# Patient Record
Sex: Male | Born: 1960
Health system: Southern US, Community
[De-identification: ages and names within clinical notes are randomized; demographics above are authoritative.]

## PROBLEM LIST (undated history)

## (undated) DIAGNOSIS — K56609 Unspecified intestinal obstruction, unspecified as to partial versus complete obstruction: Secondary | ICD-10-CM

## (undated) DIAGNOSIS — M1990 Unspecified osteoarthritis, unspecified site: Secondary | ICD-10-CM

## (undated) DIAGNOSIS — Z7901 Long term (current) use of anticoagulants: Secondary | ICD-10-CM

## (undated) DIAGNOSIS — R7401 Elevation of levels of liver transaminase levels: Secondary | ICD-10-CM

## (undated) DIAGNOSIS — K08109 Complete loss of teeth, unspecified cause, unspecified class: Secondary | ICD-10-CM

## (undated) DIAGNOSIS — I700 Atherosclerosis of aorta: Secondary | ICD-10-CM

## (undated) DIAGNOSIS — M40204 Unspecified kyphosis, thoracic region: Secondary | ICD-10-CM

## (undated) DIAGNOSIS — E1065 Type 1 diabetes mellitus with hyperglycemia: Secondary | ICD-10-CM

## (undated) DIAGNOSIS — E7800 Pure hypercholesterolemia, unspecified: Secondary | ICD-10-CM

## (undated) DIAGNOSIS — E1110 Type 2 diabetes mellitus with ketoacidosis without coma: Secondary | ICD-10-CM

## (undated) DIAGNOSIS — K439 Ventral hernia without obstruction or gangrene: Secondary | ICD-10-CM

## (undated) DIAGNOSIS — T8859XA Other complications of anesthesia, initial encounter: Secondary | ICD-10-CM

## (undated) DIAGNOSIS — I499 Cardiac arrhythmia, unspecified: Secondary | ICD-10-CM

## (undated) DIAGNOSIS — K631 Perforation of intestine (nontraumatic): Secondary | ICD-10-CM

## (undated) DIAGNOSIS — N529 Male erectile dysfunction, unspecified: Secondary | ICD-10-CM

## (undated) DIAGNOSIS — E0590 Thyrotoxicosis, unspecified without thyrotoxic crisis or storm: Secondary | ICD-10-CM

## (undated) DIAGNOSIS — D539 Nutritional anemia, unspecified: Secondary | ICD-10-CM

## (undated) DIAGNOSIS — R809 Proteinuria, unspecified: Secondary | ICD-10-CM

## (undated) DIAGNOSIS — IMO0002 Reserved for concepts with insufficient information to code with codable children: Secondary | ICD-10-CM

## (undated) DIAGNOSIS — E049 Nontoxic goiter, unspecified: Secondary | ICD-10-CM

## (undated) DIAGNOSIS — E041 Nontoxic single thyroid nodule: Secondary | ICD-10-CM

## (undated) DIAGNOSIS — Z98811 Dental restoration status: Secondary | ICD-10-CM

## (undated) DIAGNOSIS — G909 Disorder of the autonomic nervous system, unspecified: Secondary | ICD-10-CM

## (undated) DIAGNOSIS — F39 Unspecified mood [affective] disorder: Secondary | ICD-10-CM

## (undated) DIAGNOSIS — I10 Essential (primary) hypertension: Secondary | ICD-10-CM

## (undated) DIAGNOSIS — K219 Gastro-esophageal reflux disease without esophagitis: Secondary | ICD-10-CM

## (undated) HISTORY — PX: KNEE ARTHROSCOPY: SHX127

## (undated) HISTORY — DX: Disorder of the autonomic nervous system, unspecified: G90.9

## (undated) HISTORY — DX: Essential (primary) hypertension: I10

## (undated) HISTORY — PX: CYSTECTOMY: SUR359

## (undated) HISTORY — DX: Nontoxic goiter, unspecified: E04.9

## (undated) HISTORY — DX: Type 1 diabetes mellitus with hyperglycemia: E10.65

## (undated) HISTORY — DX: Reserved for concepts with insufficient information to code with codable children: IMO0002

## (undated) HISTORY — DX: Gastro-esophageal reflux disease without esophagitis: K21.9

## (undated) HISTORY — DX: Elevation of levels of liver transaminase levels: R74.01

## (undated) HISTORY — DX: Proteinuria, unspecified: R80.9

---

## 1983-11-09 HISTORY — PX: SKIN GRAFT SPLIT THICKNESS LEG / FOOT: SUR1303

## 2005-08-06 ENCOUNTER — Encounter: Payer: Self-pay | Admitting: Family Medicine

## 2005-08-06 LAB — CONVERTED CEMR LAB: Testosterone, total: 413 ng/mL

## 2008-12-26 ENCOUNTER — Inpatient Hospital Stay (HOSPITAL_COMMUNITY): Admission: EM | Admit: 2008-12-26 | Discharge: 2008-12-29 | Payer: Self-pay | Admitting: Emergency Medicine

## 2008-12-26 HISTORY — PX: ORIF DISTAL RADIUS FRACTURE: SUR927

## 2008-12-26 HISTORY — PX: OPEN REDUCTION NASAL FRACTURE: SHX2105

## 2009-01-21 ENCOUNTER — Encounter: Admission: RE | Admit: 2009-01-21 | Discharge: 2009-01-21 | Payer: Self-pay | Admitting: Neurological Surgery

## 2009-04-09 ENCOUNTER — Encounter: Payer: Self-pay | Admitting: Family Medicine

## 2009-04-09 LAB — CONVERTED CEMR LAB
HDL: 39.7 mg/dL
Hgb A1c MFr Bld: 7.2 %
LDL Cholesterol: 87.1 mg/dL
MCV: 91.7 fL
WBC: 5.8 10*3/uL

## 2009-10-01 IMAGING — CR DG ORBITS FOR FOREIGN BODY
2 series · 2 of 2 positions shown · non-contrast
Comparison: None

CLINICAL DATA: Previous exposure to metal

ORBITS FOR FOREIGN BODY - 2 VIEW

[view not recorded (1 of 2)]
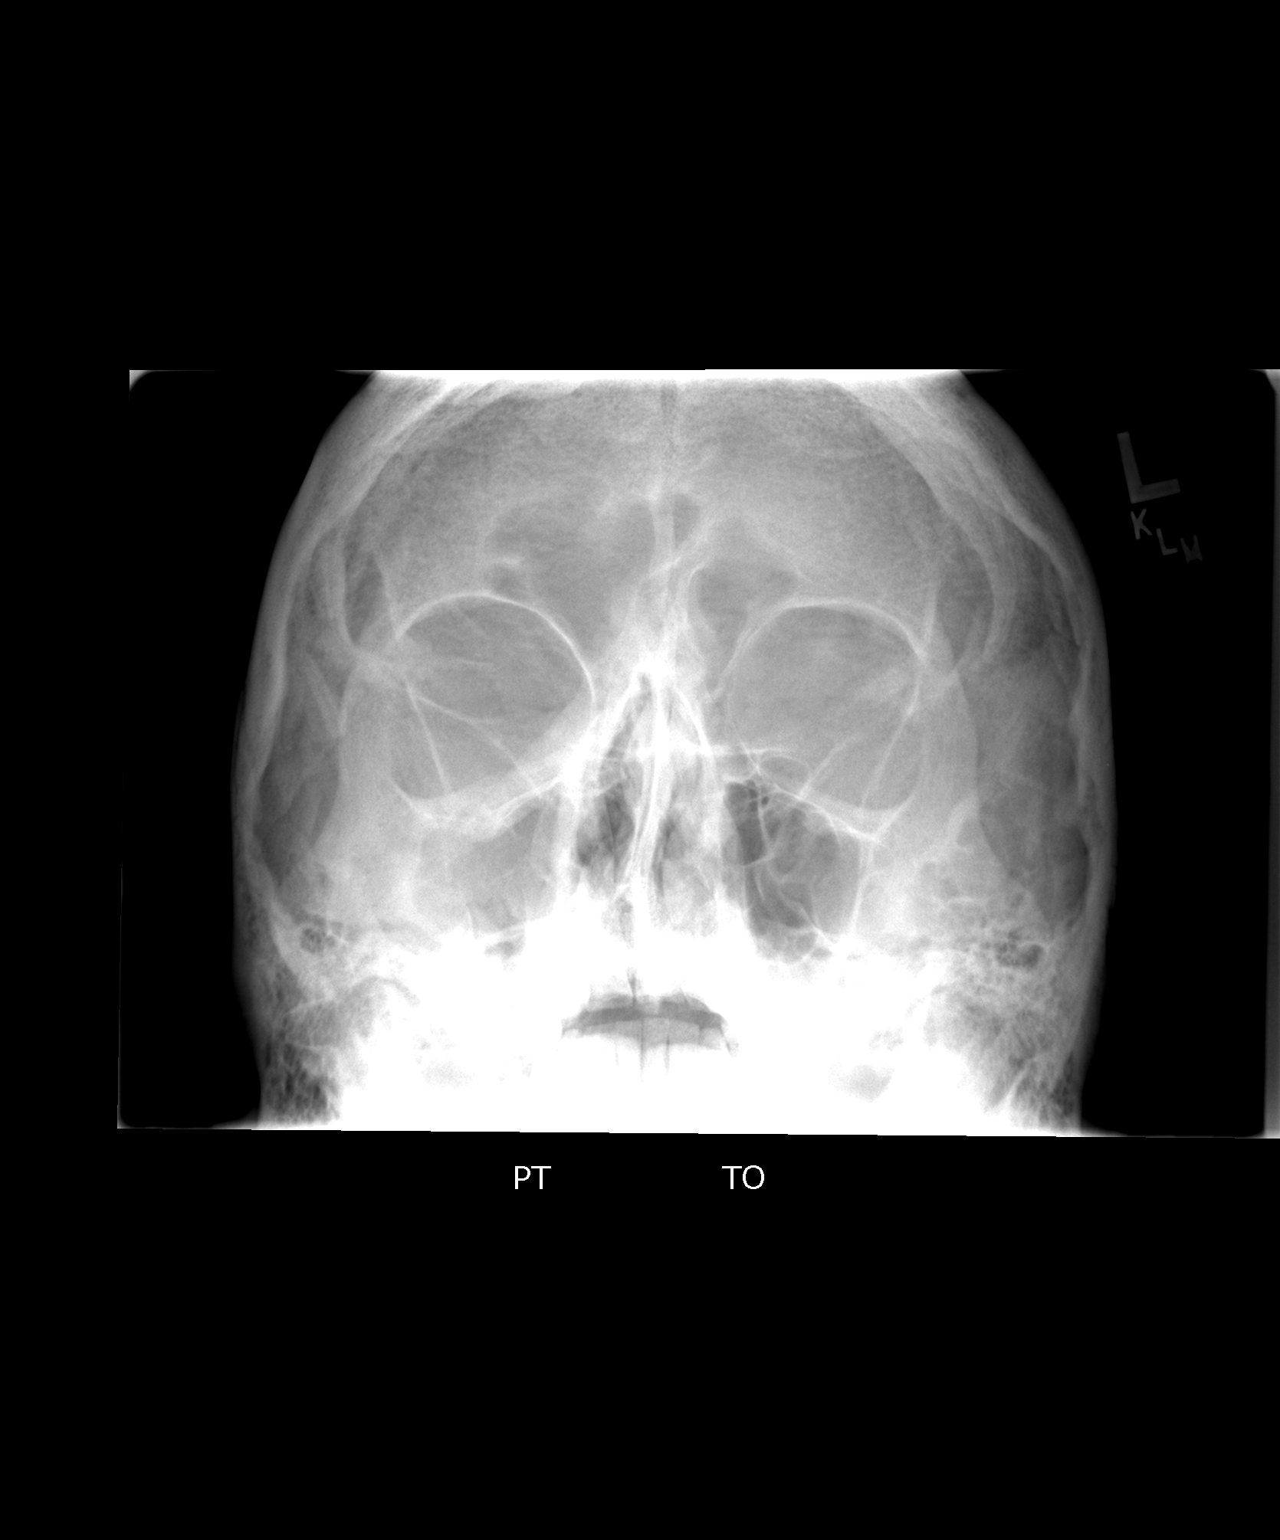

[view not recorded (2 of 2)]
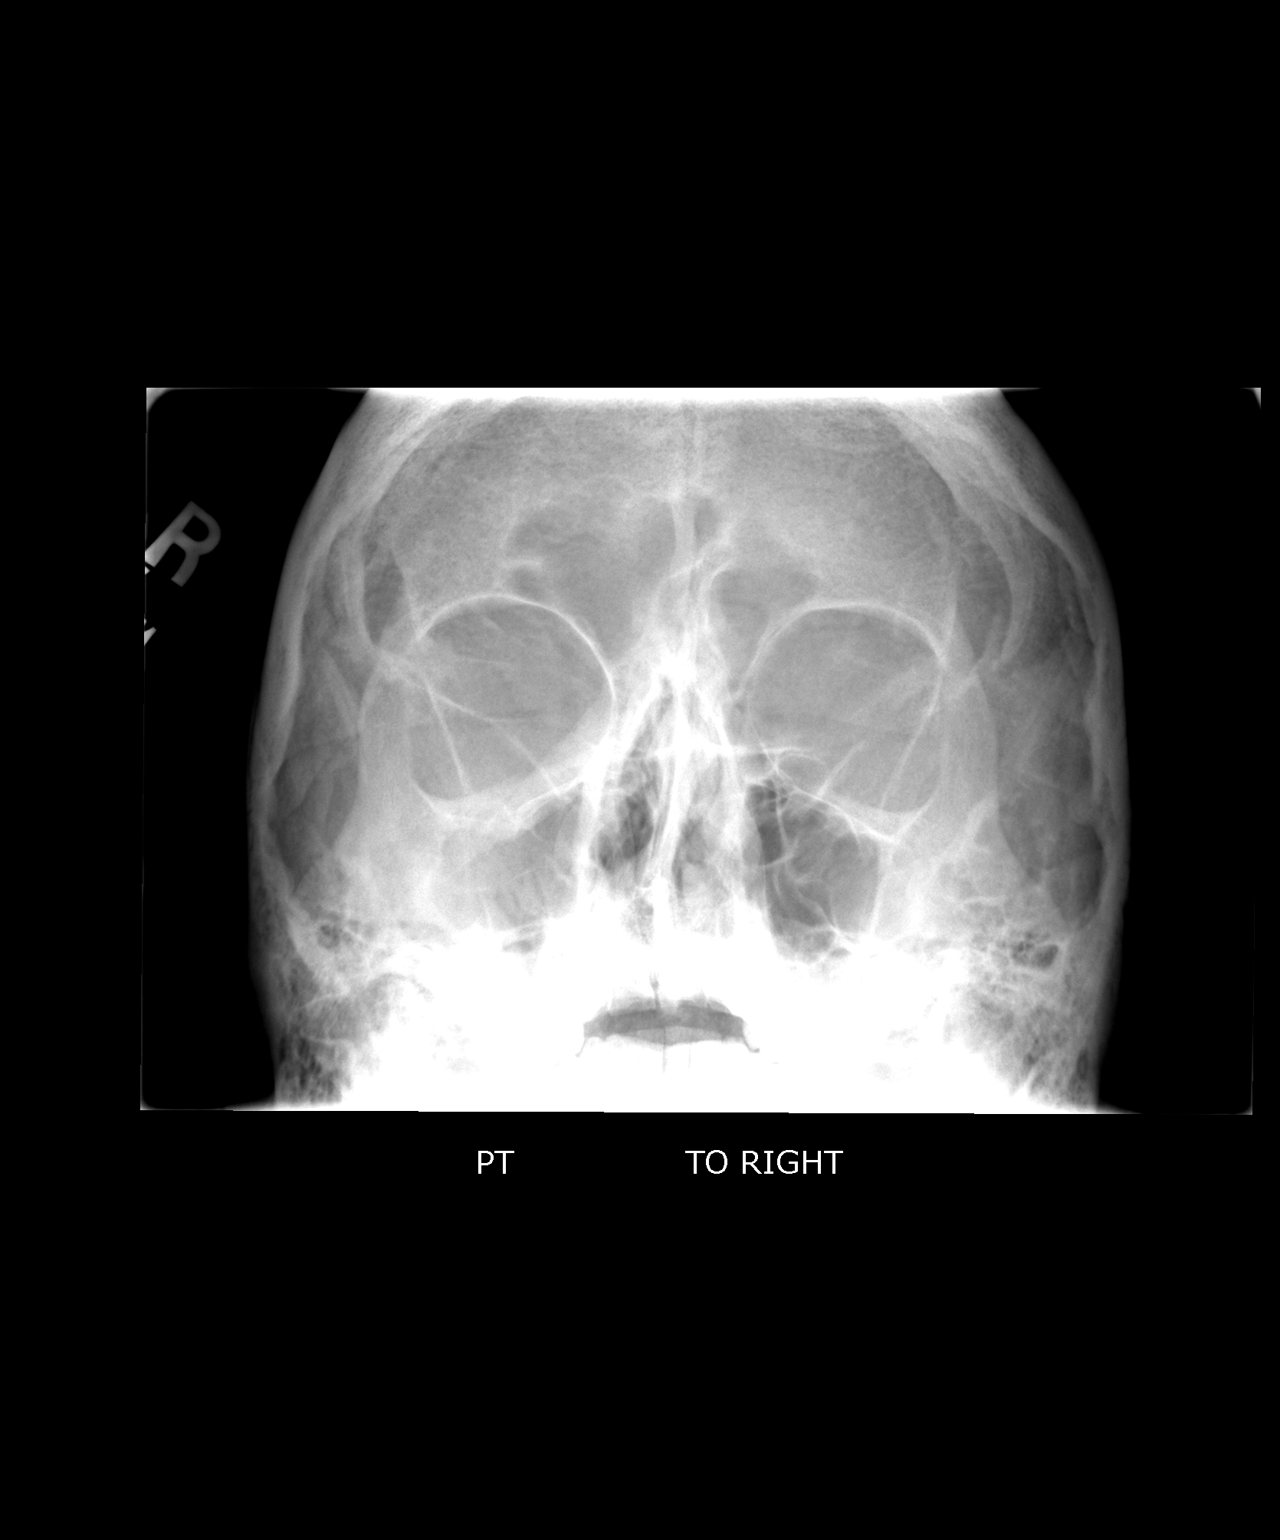

[2 of 2 positions shown; findings below may reference images not displayed]

FINDINGS: No metallic orbital foreign body.
IMPRESSION: Negative for metallic orbital foreign material.

## 2010-01-13 ENCOUNTER — Emergency Department (HOSPITAL_COMMUNITY): Admission: EM | Admit: 2010-01-13 | Discharge: 2010-01-14 | Payer: Self-pay | Admitting: Emergency Medicine

## 2010-01-30 ENCOUNTER — Encounter: Payer: Self-pay | Admitting: Family Medicine

## 2010-01-30 LAB — CONVERTED CEMR LAB
BUN: 15 mg/dL
CO2: 29.1 meq/L
Calcium: 9.3 mg/dL
Hgb A1c MFr Bld: 7.8 %
Microalb, Ur: 6 mg/dL
Potassium: 4.4 meq/L

## 2010-03-18 ENCOUNTER — Emergency Department: Payer: Self-pay | Admitting: Emergency Medicine

## 2010-04-09 ENCOUNTER — Encounter: Payer: Self-pay | Admitting: Family Medicine

## 2010-04-09 LAB — CONVERTED CEMR LAB
Cholesterol: 155 mg/dL
Triglycerides: 146 mg/dL

## 2010-09-08 ENCOUNTER — Encounter: Payer: Self-pay | Admitting: Family Medicine

## 2010-09-08 LAB — CONVERTED CEMR LAB: Hgb A1c MFr Bld: 7.8 %

## 2010-10-22 ENCOUNTER — Ambulatory Visit: Payer: Self-pay | Admitting: Internal Medicine

## 2010-10-22 DIAGNOSIS — E108 Type 1 diabetes mellitus with unspecified complications: Secondary | ICD-10-CM | POA: Insufficient documentation

## 2010-10-22 DIAGNOSIS — I10 Essential (primary) hypertension: Secondary | ICD-10-CM | POA: Insufficient documentation

## 2010-10-22 DIAGNOSIS — F17200 Nicotine dependence, unspecified, uncomplicated: Secondary | ICD-10-CM | POA: Insufficient documentation

## 2010-10-22 DIAGNOSIS — E109 Type 1 diabetes mellitus without complications: Secondary | ICD-10-CM | POA: Insufficient documentation

## 2010-10-22 DIAGNOSIS — E1069 Type 1 diabetes mellitus with other specified complication: Secondary | ICD-10-CM

## 2010-10-22 DIAGNOSIS — E785 Hyperlipidemia, unspecified: Secondary | ICD-10-CM

## 2010-10-22 DIAGNOSIS — K219 Gastro-esophageal reflux disease without esophagitis: Secondary | ICD-10-CM | POA: Insufficient documentation

## 2010-10-22 HISTORY — DX: Type 1 diabetes mellitus with other specified complication: E10.69

## 2010-10-22 HISTORY — DX: Nicotine dependence, unspecified, uncomplicated: F17.200

## 2010-11-07 ENCOUNTER — Encounter: Payer: Self-pay | Admitting: Family Medicine

## 2010-11-29 ENCOUNTER — Encounter: Payer: Self-pay | Admitting: Neurological Surgery
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
## 2011-02-01 LAB — URINALYSIS, ROUTINE W REFLEX MICROSCOPIC
Glucose, UA: >1000 mg/dL — AB
Leukocytes, UA: NEGATIVE
Nitrite: NEGATIVE
pH: 7.5 (ref 5.0–8.0)

## 2011-02-01 LAB — DIFFERENTIAL
Eosinophils Absolute: 0.3 10*3/uL (ref 0.0–0.7)
Eosinophils Relative: 3 % (ref 0–5)
Lymphs Abs: 2.8 10*3/uL (ref 0.7–4.0)
Monocytes Absolute: 0.6 10*3/uL (ref 0.1–1.0)
Monocytes Relative: 6 % (ref 3–12)

## 2011-02-01 LAB — COMPREHENSIVE METABOLIC PANEL
ALT: 20 U/L (ref 0–53)
AST: 16 U/L (ref 0–37)
Albumin: 3.4 g/dL — ABNORMAL LOW (ref 3.5–5.2)
CO2: 26 mEq/L (ref 19–32)
Calcium: 8.5 mg/dL (ref 8.4–10.5)
GFR calc Af Amer: >60 mL/min (ref >60)
Sodium: 136 mEq/L (ref 135–145)
Total Protein: 6.1 g/dL (ref 6.0–8.3)

## 2011-02-01 LAB — CBC
MCHC: 34.1 g/dL (ref 30.0–36.0)
RBC: 4.88 MIL/uL (ref 4.22–5.81)
RDW: 13.4 % (ref 11.5–15.5)

## 2011-02-01 LAB — URINE CULTURE

## 2011-02-01 LAB — URINE MICROSCOPIC-ADD ON

## 2011-02-01 LAB — HEMOCCULT GUIAC POC 1CARD (OFFICE): Fecal Occult Bld: NEGATIVE

## 2011-02-23 LAB — COMPREHENSIVE METABOLIC PANEL
Albumin: 3.5 g/dL (ref 3.5–5.2)
Alkaline Phosphatase: 65 U/L (ref 39–117)
BUN: 17 mg/dL (ref 6–23)
Chloride: 102 mEq/L (ref 96–112)
Creatinine, Ser: 0.79 mg/dL (ref 0.4–1.5)
Glucose, Bld: 86 mg/dL (ref 70–99)
Total Bilirubin: 1.3 mg/dL — ABNORMAL HIGH (ref 0.3–1.2)

## 2011-02-23 LAB — BASIC METABOLIC PANEL
BUN: 18 mg/dL (ref 6–23)
Chloride: 105 mEq/L (ref 96–112)
GFR calc non Af Amer: >60 mL/min (ref >60)
Glucose, Bld: 351 mg/dL — ABNORMAL HIGH (ref 70–99)
Potassium: 5 mEq/L (ref 3.5–5.1)
Sodium: 137 mEq/L (ref 135–145)

## 2011-02-23 LAB — LIPASE, BLOOD: Lipase: 20 U/L (ref 11–59)

## 2011-02-23 LAB — GLUCOSE, CAPILLARY
Glucose-Capillary: 204 mg/dL — ABNORMAL HIGH (ref 70–99)
Glucose-Capillary: 227 mg/dL — ABNORMAL HIGH (ref 70–99)
Glucose-Capillary: 233 mg/dL — ABNORMAL HIGH (ref 70–99)
Glucose-Capillary: 265 mg/dL — ABNORMAL HIGH (ref 70–99)
Glucose-Capillary: 276 mg/dL — ABNORMAL HIGH (ref 70–99)
Glucose-Capillary: 29 mg/dL — CL (ref 70–99)
Glucose-Capillary: 325 mg/dL — ABNORMAL HIGH (ref 70–99)
Glucose-Capillary: 373 mg/dL — ABNORMAL HIGH (ref 70–99)
Glucose-Capillary: 401 mg/dL — ABNORMAL HIGH (ref 70–99)
Glucose-Capillary: 421 mg/dL — ABNORMAL HIGH (ref 70–99)

## 2011-02-23 LAB — CBC
HCT: 45.8 % (ref 39.0–52.0)
Hemoglobin: 14.1 g/dL (ref 13.0–17.0)
Hemoglobin: 15.9 g/dL (ref 13.0–17.0)
MCHC: 34.5 g/dL (ref 30.0–36.0)
MCV: 93.8 fL (ref 78.0–100.0)
MCV: 94.7 fL (ref 78.0–100.0)
Platelets: 310 10*3/uL (ref 150–400)
Platelets: 313 10*3/uL (ref 150–400)
RDW: 13.8 % (ref 11.5–15.5)
RDW: 13.8 % (ref 11.5–15.5)
RDW: 14 % (ref 11.5–15.5)
WBC: 13.4 10*3/uL — ABNORMAL HIGH (ref 4.0–10.5)

## 2011-02-23 LAB — DIFFERENTIAL
Basophils Absolute: 0 10*3/uL (ref 0.0–0.1)
Basophils Relative: 0 % (ref 0–1)
Lymphocytes Relative: 15 % (ref 12–46)
Monocytes Absolute: 0.5 10*3/uL (ref 0.1–1.0)
Neutro Abs: 10.8 10*3/uL — ABNORMAL HIGH (ref 1.7–7.7)
Neutrophils Relative %: 81 % — ABNORMAL HIGH (ref 43–77)

## 2011-02-23 LAB — URINALYSIS, ROUTINE W REFLEX MICROSCOPIC
Bilirubin Urine: NEGATIVE
Ketones, ur: NEGATIVE mg/dL
Nitrite: NEGATIVE
Protein, ur: NEGATIVE mg/dL
Urobilinogen, UA: 0.2 mg/dL (ref 0.0–1.0)

## 2011-02-23 LAB — HEMOGLOBIN A1C
Hgb A1c MFr Bld: 7 % — ABNORMAL HIGH (ref 4.6–6.1)
Mean Plasma Glucose: 154 mg/dL
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
## 2011-04-01 ENCOUNTER — Encounter: Payer: Self-pay | Admitting: Family Medicine

## 2011-04-06 ENCOUNTER — Encounter: Payer: Self-pay | Admitting: Family Medicine

## 2011-04-06 ENCOUNTER — Ambulatory Visit (INDEPENDENT_AMBULATORY_CARE_PROVIDER_SITE_OTHER): Payer: PRIVATE HEALTH INSURANCE | Admitting: Family Medicine

## 2011-04-06 VITALS — BP 130/72 | HR 88 | Temp 98.3°F | Wt 248.0 lb

## 2011-04-06 DIAGNOSIS — M722 Plantar fascial fibromatosis: Secondary | ICD-10-CM | POA: Insufficient documentation

## 2011-04-06 NOTE — Assessment & Plan Note (Signed)
Right side. See pt instructions. Supportive care.  Update [place] if not improving as expected. May benefit from orthotics - if pt desires may refer to [facility] or podiatry in future.

## 2011-04-06 NOTE — Progress Notes (Signed)
  Subjective:    Patient ID: Russell Gomez, male    DOB: [birth year], 50 y.o.   MRN: [id number]  HPI CC: R foot pain  R foot pain - started about 1 mo ago, R heel.  Worse towards end of week.  Works on Cur[employer] at [employer] - 8 hours on feet, concrete.  Has been off feet last 3 days - this helped.  Tried alleve when got home which helped.  Worse pain when standing from sitting position.  Not worse with first couple steps.  Also wants varicose veins checked out.  DM - last A1c 7.1%, was having too many lows, endo changing insulin.  Review of Systems Per HPI    Objective:   Physical Exam  Nursing note and vitals reviewed. Constitutional: He appears well-developed and well-nourished. No distress.  Musculoskeletal:       Flat foot bilateral R>L with some loss of longitudinal and transverse arches, excess pronation. + tender R heel medial>lateral, worse with dorsiflexion. No heel spur appreciated  Monofilament intact bilaterally. 2+ DP bilaterally + vericose veins L>R leg, no palp cords or erythema/edema          Assessment & Plan:

## 2011-04-06 NOTE — Patient Instructions (Addendum)
You have plantar fasciitis. Handout provided. Frozen waterbottle massage when you get home at night. Buy heel cushion to use at work, look into boots with better support for heels. May use anti inflammatory as needed. Keep thinking about cutting back or quitting smoking. See you for your physical.  Plantar Fasciitis Plantar fasciitis is a common condition that causes foot pain. It is soreness (inflammation) of the band of tough fibrous tissue on the bottom of the foot that runs from the heel bone (calcaneus) to the ball of the foot. The cause of this soreness may be from excessive standing, poor fitting shoes, running on hard surfaces, being overweight, having an abnormal walk, or overuse (this is common in runners) of the painful foot or feet. It is also common in aerobic exercise dancers and ballet dancers.  SYMPTOMS Most people with plantar fasciitis complain of:  Severe pain in the morning on the bottom of their foot especially when taking the first steps out of bed. This pain recedes after a few minutes of walking.   Severe pain is experienced also during walking following a long period of inactivity.   Pain is worse when walking barefoot or up stairs  DIAGNOSIS  Your caregiver will diagnose this condition by examining and feeling your foot.   Special tests such as x-rays of your foot, are usually not needed.  PREVENTION  Consult a sports medicine professional before beginning a new exercise program.   Walking programs offer a good workout. With walking there is a lower chance of overuse injuries common to runners. There is less impact and less jarring of the joints.   Begin all new exercise programs slowly. If problems or pain develop, decrease the amount of time or distance until you are at a comfortable level.   Wear good shoes and replace them regularly.   Stretch your foot and the heel cords at the back of the ankle (Achilles tendon) both before and after exercise.   Run  or exercise on even surfaces that are not hard. For example, asphalt is better than pavement.   Do not run barefoot on hard surfaces.   If using a treadmill, vary the incline.   Do not continue to workout if you have foot or joint problems. Seek professional help if they do not improve.  HOME CARE INSTRUCTIONS  Avoid activities that cause you pain until you recover.   Use ice or cold packs on the problem or painful areas after working out.   Only take over-the-counter or prescription medicines for pain, discomfort, or fever as directed by your caregiver.   Soft shoe inserts or athletic shoes with air or gel sole cushions may be helpful.   If problems continue or become more severe, consult a sports medicine caregiver or your own health care provider. Cortisone is a potent anti-inflammatory medication that may be injected into the painful area. You can discuss this treatment with your caregiver.  MAKE SURE YOU:  Understand these instructions.   Will watch your condition.   Will get help right away if you are not doing well or get worse.  Document Released: 07/20/2001 Document Re-Released: 01/19/2010 [facility] Patient Information 2011 [place], [place].

## 2011-04-26 ENCOUNTER — Other Ambulatory Visit: Payer: Self-pay | Admitting: *Deleted

## 2011-04-26 NOTE — Telephone Encounter (Signed)
I don't think we've filled in past. Please verify with patient how he's taking. May send if endo is not refilling.

## 2011-04-26 NOTE — Telephone Encounter (Signed)
Refill request for reglan from [facility] [address]. Directions in chart are for 2 a day, request if for one three times a day.  Please advise.

## 2011-04-27 MED ORDER — METOCLOPRAMIDE HCL 10 MG PO TABS: 10.0000 mg | ORAL_TABLET | Freq: Two times a day (BID) | ORAL | Status: DC | PRN

## 2011-04-27 NOTE — Telephone Encounter (Signed)
Spoke with patient. He said his previous PCP was the one who prescribed the Reglan and he was taking it twice a day as needed. Rx refilled.

## 2011-08-28 ENCOUNTER — Other Ambulatory Visit: Payer: Self-pay | Admitting: Family Medicine

## 2011-09-13 ENCOUNTER — Telehealth: Payer: Self-pay | Admitting: *Deleted

## 2011-09-13 MED ORDER — ENALAPRIL MALEATE 20 MG PO TABS: 40.0000 mg | ORAL_TABLET | Freq: Every day | ORAL | Status: DC

## 2011-09-13 NOTE — Telephone Encounter (Signed)
Patient called and requested a trial of Viagra. He said things "aren't working like they should be" due to him getting older and his DM. I advised that you may want him to come in to discuss starting it due to its potential risks and side effects. He said he had taken it before a long time ago. I advised that this was something that had not been mentioned at his previous visits, so that's why you may need to see him. He verbalized understanding and will await a return call from me.

## 2011-09-13 NOTE — Telephone Encounter (Signed)
What dose was he on previously - 50mg  or 100mg ?

## 2011-09-14 MED ORDER — SILDENAFIL CITRATE 100 MG PO TABS: 100.0000 mg | ORAL_TABLET | ORAL | Status: DC | PRN

## 2011-09-14 NOTE — Telephone Encounter (Signed)
Sent in

## 2011-09-14 NOTE — Telephone Encounter (Signed)
Patient said he was prescribed 100 mg but was told to break it in half. He said he was fine with just trying the 50 mg and he may actually end up breaking them in half as well.

## 2011-09-14 NOTE — Telephone Encounter (Signed)
Patient notified

## 2011-09-27 ENCOUNTER — Other Ambulatory Visit: Payer: Self-pay | Admitting: *Deleted

## 2011-09-27 MED ORDER — PRAVASTATIN SODIUM 40 MG PO TABS: 40.0000 mg | ORAL_TABLET | Freq: Every day | ORAL | Status: DC

## 2011-10-26 ENCOUNTER — Other Ambulatory Visit: Payer: Self-pay | Admitting: Family Medicine

## 2011-10-28 ENCOUNTER — Other Ambulatory Visit: Payer: Self-pay | Admitting: Family Medicine

## 2011-10-29 ENCOUNTER — Other Ambulatory Visit: Payer: Self-pay | Admitting: *Deleted

## 2011-10-29 MED ORDER — PRAVASTATIN SODIUM 40 MG PO TABS: 40.0000 mg | ORAL_TABLET | Freq: Every day | ORAL | Status: DC

## 2011-10-29 NOTE — Telephone Encounter (Signed)
Pt has appt next month needs refill until seen.

## 2011-11-12 ENCOUNTER — Ambulatory Visit (INDEPENDENT_AMBULATORY_CARE_PROVIDER_SITE_OTHER): Payer: PRIVATE HEALTH INSURANCE | Admitting: Family Medicine

## 2011-11-12 ENCOUNTER — Encounter: Payer: Self-pay | Admitting: Family Medicine

## 2011-11-12 VITALS — BP 136/80 | HR 88 | Temp 98.2°F | Wt 234.0 lb

## 2011-11-12 DIAGNOSIS — I10 Essential (primary) hypertension: Secondary | ICD-10-CM

## 2011-11-12 DIAGNOSIS — E118 Type 2 diabetes mellitus with unspecified complications: Secondary | ICD-10-CM

## 2011-11-12 DIAGNOSIS — F17200 Nicotine dependence, unspecified, uncomplicated: Secondary | ICD-10-CM

## 2011-11-12 DIAGNOSIS — E785 Hyperlipidemia, unspecified: Secondary | ICD-10-CM

## 2011-11-12 LAB — BASIC METABOLIC PANEL
Calcium: 8.7 mg/dL (ref 8.4–10.5)
GFR: 100.41 mL/min (ref >60.00)
Glucose, Bld: 244 mg/dL — ABNORMAL HIGH (ref 70–99)
Potassium: 4.5 mEq/L (ref 3.5–5.1)
Sodium: 139 mEq/L (ref 135–145)

## 2011-11-12 LAB — LIPID PANEL
Total CHOL/HDL Ratio: 4
VLDL: 15.6 mg/dL (ref 0.0–40.0)

## 2011-11-12 LAB — HEMOGLOBIN A1C: Hgb A1c MFr Bld: 7.6 % — ABNORMAL HIGH (ref 4.6–6.5)

## 2011-11-12 MED ORDER — OMEPRAZOLE 40 MG PO CPDR: 40.0000 mg | DELAYED_RELEASE_CAPSULE | Freq: Every day | ORAL | Status: DC

## 2011-11-12 MED ORDER — METOCLOPRAMIDE HCL 10 MG PO TABS: 10.0000 mg | ORAL_TABLET | Freq: Two times a day (BID) | ORAL | Status: DC | PRN

## 2011-11-12 MED ORDER — PRAVASTATIN SODIUM 40 MG PO TABS: 40.0000 mg | ORAL_TABLET | Freq: Every day | ORAL | Status: DC

## 2011-11-12 MED ORDER — ENALAPRIL MALEATE 20 MG PO TABS: 40.0000 mg | ORAL_TABLET | Freq: Every day | ORAL | Status: DC

## 2011-11-12 NOTE — Patient Instructions (Addendum)
Good to see you today Return in 1 year for physical. Blood work today.  i will send copy of blood work to Dr. Renae Fickle. Keep thinking about smoking cessation, look into quitline[place].com for further resources. Call us with questions.
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
## 2011-11-13 NOTE — Assessment & Plan Note (Signed)
Stable.  Not optimal control.   Prior visit controlled.   If stays >130/80, consider addition of another med.  On enalapril 40mg  daily (max dose). rec keep eye on bp at home. BP Readings from Last 3 Encounters:  11/12/11 136/80  04/06/11 130/72  10/22/10 136/80

## 2011-11-13 NOTE — Assessment & Plan Note (Addendum)
Per pt last A1c 7.3% As checking blood work today, will add A1c and route to endo. rec set up vision screen. Reports tolerance of reglan.

## 2011-11-13 NOTE — Assessment & Plan Note (Signed)
Continue to encourage cessation.  Contemplative. Provided with quitlin[place].com resource.

## 2011-11-13 NOTE — Assessment & Plan Note (Addendum)
Compliant, tolerant of statin.  Check blood work today as fasting, then route to endo.

## 2012-02-22 ENCOUNTER — Telehealth: Payer: Self-pay

## 2012-02-22 MED ORDER — HYDROCHLOROTHIAZIDE 12.5 MG PO CAPS: 12.5000 mg | ORAL_CAPSULE | Freq: Every day | ORAL | Status: DC

## 2012-02-22 NOTE — Telephone Encounter (Signed)
I would like to start him on HCTZ - water pill.  Will start low dose to ensure tolerated.   Needs to ensure good water intake throughout the day to avoid dehydration. Return in 1-2 mo for bp f/u.

## 2012-02-22 NOTE — Telephone Encounter (Signed)
Message left advising patient of new Rx. Advised to stay well hydrated and to call and schedule a follow up for 1-2 months. Instructed to call with any questions.

## 2012-02-22 NOTE — Telephone Encounter (Signed)
Pt left v/m that when he saw Dr Sharen Hones in jan BP was elevated 136/80. Pt saw endocrinologist 2 weeks ago and BP still elevated.  Pt wants to know if need to increase BP med or change to different BP med or what is Dr Nicanor Alcon suggestion. Pt can be reached at [phone] and if no answer may leave message. I tried to reach pt to get more details about BP readings and pts pharmacy but was unable to reach pt x 2. Please advise. Pt last seen 11/12/11.

## 2012-03-02 NOTE — Telephone Encounter (Addendum)
photosensitivity is a possibility but not too common with this med. rec sunscreen regardless. Could start med when returns from beach.

## 2012-03-02 NOTE — Telephone Encounter (Signed)
Patient notified. He will wear sunscreen so he can go ahead and start the medicine. Advised to wear sunscreen regardless. He verbalized understanding.

## 2012-03-02 NOTE — Telephone Encounter (Signed)
Pt has not started HCTZ yet but pt reading note with med to avoid direct sunlight. Pt is going to beach and pt does tan; wants to know if HCTZ will affect his ability to tan? Pt can be reached at [phone].

## 2012-04-08 DIAGNOSIS — E049 Nontoxic goiter, unspecified: Secondary | ICD-10-CM

## 2012-04-08 HISTORY — DX: Nontoxic goiter, unspecified: E04.9

## 2012-04-19 ENCOUNTER — Encounter: Payer: Self-pay | Admitting: Family Medicine

## 2012-04-19 ENCOUNTER — Ambulatory Visit (INDEPENDENT_AMBULATORY_CARE_PROVIDER_SITE_OTHER): Payer: PRIVATE HEALTH INSURANCE | Admitting: Family Medicine

## 2012-04-19 VITALS — BP 136/84 | HR 88 | Temp 98.3°F | Wt 220.8 lb

## 2012-04-19 DIAGNOSIS — I10 Essential (primary) hypertension: Secondary | ICD-10-CM

## 2012-04-19 DIAGNOSIS — R221 Localized swelling, mass and lump, neck: Secondary | ICD-10-CM

## 2012-04-19 DIAGNOSIS — E010 Iodine-deficiency related diffuse (endemic) goiter: Secondary | ICD-10-CM | POA: Insufficient documentation

## 2012-04-19 DIAGNOSIS — F17200 Nicotine dependence, unspecified, uncomplicated: Secondary | ICD-10-CM

## 2012-04-19 DIAGNOSIS — E785 Hyperlipidemia, unspecified: Secondary | ICD-10-CM

## 2012-04-19 DIAGNOSIS — E118 Type 2 diabetes mellitus with unspecified complications: Secondary | ICD-10-CM

## 2012-04-19 DIAGNOSIS — K219 Gastro-esophageal reflux disease without esophagitis: Secondary | ICD-10-CM

## 2012-04-19 DIAGNOSIS — R220 Localized swelling, mass and lump, head: Secondary | ICD-10-CM

## 2012-04-19 DIAGNOSIS — N529 Male erectile dysfunction, unspecified: Secondary | ICD-10-CM | POA: Insufficient documentation

## 2012-04-19 MED ORDER — ENALAPRIL MALEATE 20 MG PO TABS: 20.0000 mg | ORAL_TABLET | Freq: Every day | ORAL | Status: DC

## 2012-04-19 NOTE — Progress Notes (Signed)
  Subjective:    Patient ID: Russell Gomez, male    DOB: [birth year], 51 y.o.   MRN: [id number]  HPI CC: f/u HTN  HTN - started on HCTZ 12.5mg  in April.  However wasn't taking enalapril.  BP at home some elevated still.  No HA, vision changes, CP/tightness, SOB, leg swelling.  BP Readings from Last 3 Encounters:  04/19/12 136/84  11/12/11 136/80  04/06/11 130/72    DM - some changes by endo.  Last A1c per pt was around 7.6%.  Followed by Dr. Renae Fickle.  HLD - compliant pravastatin.  Takes at night.  No myalgias.  Smoking - about 1 ppd.  Has used chantix in past, didn't like how he felt on it.  ED - noticing more problems since starting HCTZ.  Neck swelling R>L - 1 mo h/o this.  Nontender.  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  Review of Systems Per HPI    Objective:   Physical Exam  Vitals reviewed. Constitutional: He appears well-developed and well-nourished. No distress.  HENT:  Head: Normocephalic and atraumatic.  Mouth/Throat: Oropharynx is clear and moist. No oropharyngeal exudate.  Eyes: Conjunctivae and EOM are normal. Pupils are equal, round, and reactive to light. No scleral icterus.  Neck: Normal range of motion. Neck supple. Carotid bruit is not present.       evident neck fullness R>L, nontender.  Not really midline.  Cardiovascular: Normal rate, regular rhythm, normal heart sounds and intact distal pulses.   No murmur heard. Pulmonary/Chest: Breath sounds normal. No respiratory distress. He has no wheezes. He has no rales.  Abdominal: Soft. There is no tenderness.       No abd/renal bruits  Lymphadenopathy:    He has cervical adenopathy (R AC nontender).  Skin: Skin is warm and dry. No rash noted.       Assessment & Plan:  To return for CPE at age 59 yo

## 2012-04-19 NOTE — Patient Instructions (Addendum)
Start enalapril 20mg  daily as well as hydrochlorothiazide 12.5mg  daily. Pass by Marion's office to schedule ultrasound of the neck. Keep eye on blood pressure and call me if consistently greater than 130/80.  We want top number to be >100. Good to see you, call us with quesitons. Return for physical after you turn 51

## 2012-04-19 NOTE — Assessment & Plan Note (Signed)
Chronic, stable. Pt actually stopped ACEI - confusion about continuing along with HCTZ.  Advised restart, expect good control on 20mg  daily. To update me if staying >130/80.

## 2012-04-19 NOTE — Assessment & Plan Note (Signed)
Continue viagra. Discussed how DM, HTN, HLD and smoking can cause ED as well as bp meds.

## 2012-04-19 NOTE — Assessment & Plan Note (Signed)
New, 1 mo duration.  Along with R LAD in smoker.   No frank mass appreciated, rather fullness. start with neck US to eval, possibly large goiter but rather lateral for thyroid mass.

## 2012-04-19 NOTE — Assessment & Plan Note (Signed)
Continue PPI ?

## 2012-04-19 NOTE — Assessment & Plan Note (Signed)
Continue to encourage cessation. Did not like chantix in past.

## 2012-04-19 NOTE — Assessment & Plan Note (Addendum)
Chronic, stable. Tolerant of statin. LDL 107 (11/2011)

## 2012-04-19 NOTE — Assessment & Plan Note (Signed)
Chronic, sounds stable. Followed by endo, recent changes to insulin.

## 2012-04-21 ENCOUNTER — Ambulatory Visit: Payer: Self-pay | Admitting: Family Medicine

## 2012-04-24 ENCOUNTER — Encounter: Payer: Self-pay | Admitting: Family Medicine

## 2012-04-26 ENCOUNTER — Other Ambulatory Visit: Payer: Self-pay | Admitting: Family Medicine

## 2012-04-26 DIAGNOSIS — E041 Nontoxic single thyroid nodule: Secondary | ICD-10-CM

## 2012-04-26 DIAGNOSIS — E010 Iodine-deficiency related diffuse (endemic) goiter: Secondary | ICD-10-CM

## 2012-04-27 ENCOUNTER — Other Ambulatory Visit (INDEPENDENT_AMBULATORY_CARE_PROVIDER_SITE_OTHER): Payer: PRIVATE HEALTH INSURANCE

## 2012-04-27 DIAGNOSIS — E049 Nontoxic goiter, unspecified: Secondary | ICD-10-CM

## 2012-04-27 DIAGNOSIS — E010 Iodine-deficiency related diffuse (endemic) goiter: Secondary | ICD-10-CM

## 2012-04-27 LAB — T4, FREE: Free T4: 0.89 ng/dL (ref 0.60–1.60)

## 2012-04-27 LAB — T3, FREE: T3, Free: 3.2 pg/mL (ref 2.3–4.2)

## 2012-05-02 ENCOUNTER — Ambulatory Visit: Payer: Self-pay | Admitting: Family Medicine

## 2012-05-02 ENCOUNTER — Encounter: Payer: Self-pay | Admitting: Family Medicine

## 2012-05-04 ENCOUNTER — Encounter: Payer: Self-pay | Admitting: Family Medicine

## 2012-05-05 ENCOUNTER — Other Ambulatory Visit: Payer: Self-pay | Admitting: Family Medicine

## 2012-05-05 DIAGNOSIS — E041 Nontoxic single thyroid nodule: Secondary | ICD-10-CM | POA: Insufficient documentation

## 2012-06-28 ENCOUNTER — Encounter: Payer: Self-pay | Admitting: Family Medicine

## 2012-06-28 ENCOUNTER — Ambulatory Visit (INDEPENDENT_AMBULATORY_CARE_PROVIDER_SITE_OTHER): Payer: PRIVATE HEALTH INSURANCE | Admitting: Family Medicine

## 2012-06-28 VITALS — BP 148/90 | HR 84 | Temp 98.6°F | Wt 221.0 lb

## 2012-06-28 DIAGNOSIS — I10 Essential (primary) hypertension: Secondary | ICD-10-CM

## 2012-06-28 DIAGNOSIS — E010 Iodine-deficiency related diffuse (endemic) goiter: Secondary | ICD-10-CM

## 2012-06-28 DIAGNOSIS — E049 Nontoxic goiter, unspecified: Secondary | ICD-10-CM

## 2012-06-28 DIAGNOSIS — E118 Type 2 diabetes mellitus with unspecified complications: Secondary | ICD-10-CM

## 2012-06-28 DIAGNOSIS — N529 Male erectile dysfunction, unspecified: Secondary | ICD-10-CM

## 2012-06-28 DIAGNOSIS — E785 Hyperlipidemia, unspecified: Secondary | ICD-10-CM

## 2012-06-28 MED ORDER — ENALAPRIL MALEATE 20 MG PO TABS: 40.0000 mg | ORAL_TABLET | Freq: Every day | ORAL | Status: DC

## 2012-06-28 NOTE — Assessment & Plan Note (Signed)
Goiter present. TSH stable. Lab Results  Component Value Date   TSH 0.61 04/27/2012

## 2012-06-28 NOTE — Progress Notes (Signed)
  Subjective:    Patient ID: Russell Gomez, male    DOB: 16-Oct-1961, 51 y.o.   MRN: [id number]  HPI CC: f/u HTN  BP Readings from Last 3 Encounters:  04/19/12 136/84  11/12/11 136/80  04/06/11 130/72  HTN - No vision changes, CP/tightness, SOB, leg swelling. Has been taking enalapril 20mg  daily as well as HCTZ 12.5mg  daily.  Did have HA but attributes to episode where sugar was too high (380s).  DM - some changes by endo. Sugars at night 300s.  Then drop to 150 after lantus.  Last A1c per pt was around 7.6%. Followed by Dr. Renae Fickle. Sees her 07/25/2012.  HLD - compliant pravastatin. Takes at night. No myalgias.  ED - no erection in am.  Trouble getting erection.  This all seems to have started since HCTZ was added.  Smoking - 1 ppd.  Past Medical History  Diagnosis Date  . Type II or unspecified type diabetes mellitus with unspecified complication, not stated as uncontrolled     Dr. Renae Fickle at [place]  . Unspecified essential hypertension   . Esophageal reflux   . Other and unspecified hyperlipidemia   . Tobacco use disorder   . Autonomic neuropathy     ? gastroparesis  . 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 Goiter 04/2012    with nodule - benign colloid. rec rpt [place] 1 yr   Review of Systems Per HPI    Objective:   Physical Exam  Nursing note and vitals reviewed. Constitutional: He appears well-developed and well-nourished. No distress.  HENT:  Head: Normocephalic and atraumatic.  Mouth/Throat: Oropharynx is clear and moist. No oropharyngeal exudate.  Eyes: Conjunctivae and EOM are normal. Pupils are equal, round, and reactive to light. No scleral icterus.  Neck: Normal range of motion. Neck supple. Carotid bruit is not present. Thyromegaly present.  Cardiovascular: Normal rate, regular rhythm, normal heart sounds and intact distal pulses.   No murmur heard. Pulmonary/Chest: Breath sounds normal. No respiratory distress. He has no wheezes. He has  no rales.  Musculoskeletal: He exhibits no edema.  Lymphadenopathy:    He has no cervical adenopathy.  Skin: Skin is warm and dry. No rash noted.       Assessment & Plan:

## 2012-06-28 NOTE — Assessment & Plan Note (Signed)
Chronic, stable 

## 2012-06-28 NOTE — Patient Instructions (Addendum)
Stop hydrochlorothiazide - hopefully this will help other issues. Increase enalapril to 20mg  two pills daily. Return in 10 days for blood work to check kidneys. Return to see me in 3 months, sooner if needed.

## 2012-06-28 NOTE — Assessment & Plan Note (Signed)
Chronic, deteriorated. Will increase enalapril to 20mg  2 tablets daily (which is previous dose he was on) Check Cr in 10 days.

## 2012-06-28 NOTE — Assessment & Plan Note (Signed)
Not responding to viagra. ? Solely due to HCTZ as temporal association. Will ask him to stop for now, reassess next visit. If remaining, consider cardiac evaluation.

## 2012-06-28 NOTE — Assessment & Plan Note (Signed)
Uncontrolled.  Check A1c in 10 days, will fax results to endo.

## 2012-07-11 ENCOUNTER — Other Ambulatory Visit (INDEPENDENT_AMBULATORY_CARE_PROVIDER_SITE_OTHER): Payer: PRIVATE HEALTH INSURANCE

## 2012-07-11 DIAGNOSIS — I10 Essential (primary) hypertension: Secondary | ICD-10-CM

## 2012-07-11 DIAGNOSIS — E118 Type 2 diabetes mellitus with unspecified complications: Secondary | ICD-10-CM

## 2012-07-11 LAB — BASIC METABOLIC PANEL
BUN: 14 mg/dL (ref 6–23)
Chloride: 105 mEq/L (ref 96–112)
Creatinine, Ser: 0.7 mg/dL (ref 0.4–1.5)
Glucose, Bld: 103 mg/dL — ABNORMAL HIGH (ref 70–99)
Potassium: 3.8 mEq/L (ref 3.5–5.1)

## 2012-07-20 ENCOUNTER — Telehealth: Payer: Self-pay

## 2012-07-20 MED ORDER — AMLODIPINE BESYLATE 5 MG PO TABS: 5.0000 mg | ORAL_TABLET | Freq: Every day | ORAL | Status: DC

## 2012-07-20 NOTE — Telephone Encounter (Signed)
Pt was taking enalapril 20 mg two tabs in AM; BP was averaging 180/90. 2 - 3 weeks ago pt started taking enalapril 20 mg one tablet twice a day; first week BP 130/70 but since last week BP averaging 148/88. No change in diet or lifestyle.No chest pain,dizziness, h/a,SOB or leg swelling. [address]Please advise.

## 2012-07-20 NOTE — Telephone Encounter (Signed)
Noted.  Intolerant to HCTZ I believe in past. Recommend adding a second blood pressure agent - start amlodipine 5mg  daily - sent to pharmacy.

## 2012-07-21 NOTE — Telephone Encounter (Signed)
Message left on home phone advising new med sent into pharmacy. Instructed to take in addition to current med. Advised to call with any questions or concerns.

## 2012-07-25 ENCOUNTER — Encounter: Payer: Self-pay | Admitting: Family Medicine

## 2012-09-28 ENCOUNTER — Encounter: Payer: Self-pay | Admitting: Family Medicine

## 2012-09-28 ENCOUNTER — Ambulatory Visit (INDEPENDENT_AMBULATORY_CARE_PROVIDER_SITE_OTHER): Payer: PRIVATE HEALTH INSURANCE | Admitting: Family Medicine

## 2012-09-28 VITALS — BP 142/82 | HR 84 | Temp 98.5°F | Ht 74.0 in | Wt 226.2 lb

## 2012-09-28 DIAGNOSIS — E049 Nontoxic goiter, unspecified: Secondary | ICD-10-CM

## 2012-09-28 DIAGNOSIS — E010 Iodine-deficiency related diffuse (endemic) goiter: Secondary | ICD-10-CM

## 2012-09-28 DIAGNOSIS — I10 Essential (primary) hypertension: Secondary | ICD-10-CM

## 2012-09-28 DIAGNOSIS — E1065 Type 1 diabetes mellitus with hyperglycemia: Secondary | ICD-10-CM

## 2012-09-28 DIAGNOSIS — F17200 Nicotine dependence, unspecified, uncomplicated: Secondary | ICD-10-CM

## 2012-09-28 DIAGNOSIS — E785 Hyperlipidemia, unspecified: Secondary | ICD-10-CM

## 2012-09-28 NOTE — Assessment & Plan Note (Signed)
Precontemplative. Encouraged cessation. 
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
## 2012-09-28 NOTE — Assessment & Plan Note (Signed)
Goiter present but smaller.  check TSH next blood work

## 2012-09-28 NOTE — Assessment & Plan Note (Signed)
Followed by endo.  

## 2012-09-28 NOTE — Patient Instructions (Signed)
Return in 3 months for physical, prior fasting for blood work. Good to see you today. Keep thinking about quitting smoking.

## 2012-09-28 NOTE — Assessment & Plan Note (Signed)
Chronic, stable on pravastatin 40mg  daily. Goal LDL <100, prior 107.  Recheck when returns for CPE

## 2012-09-28 NOTE — Assessment & Plan Note (Signed)
Chronic, above goal.  States at home runs better.  No changes today.  Recheck at CPE.

## 2012-11-10 ENCOUNTER — Ambulatory Visit (INDEPENDENT_AMBULATORY_CARE_PROVIDER_SITE_OTHER): Payer: PRIVATE HEALTH INSURANCE | Admitting: Family Medicine

## 2012-11-10 ENCOUNTER — Encounter: Payer: Self-pay | Admitting: Family Medicine

## 2012-11-10 VITALS — BP 140/80 | HR 84 | Temp 98.2°F

## 2012-11-10 DIAGNOSIS — M6789 Other specified disorders of synovium and tendon, multiple sites: Secondary | ICD-10-CM

## 2012-11-10 DIAGNOSIS — M6790 Unspecified disorder of synovium and tendon, unspecified site: Secondary | ICD-10-CM | POA: Insufficient documentation

## 2012-11-10 DIAGNOSIS — F17200 Nicotine dependence, unspecified, uncomplicated: Secondary | ICD-10-CM

## 2012-11-10 NOTE — Assessment & Plan Note (Signed)
Discussed dx and likely treatment options. Early trigger finger. Refer to hand for further evaluation. Pt agrees with plan.

## 2012-11-10 NOTE — Patient Instructions (Addendum)
You have developed a nodule on the tendon of that finger. Pass by Russell Gomez's office for referral to hand doctor. Keep working on smoking!

## 2012-11-10 NOTE — Assessment & Plan Note (Signed)
Continue to encourage cessation. 
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
## 2012-11-13 ENCOUNTER — Encounter: Payer: PRIVATE HEALTH INSURANCE | Admitting: Family Medicine

## 2012-11-20 ENCOUNTER — Other Ambulatory Visit: Payer: Self-pay | Admitting: Orthopedic Surgery

## 2012-11-21 ENCOUNTER — Other Ambulatory Visit: Payer: Self-pay | Admitting: Family Medicine

## 2012-11-22 ENCOUNTER — Other Ambulatory Visit: Payer: Self-pay | Admitting: Family Medicine

## 2012-11-23 ENCOUNTER — Encounter (HOSPITAL_BASED_OUTPATIENT_CLINIC_OR_DEPARTMENT_OTHER): Payer: Self-pay | Admitting: *Deleted

## 2012-11-23 NOTE — Progress Notes (Signed)
Pt diabetic-will come in for bmet-ekg No cardiac problems

## 2012-11-27 ENCOUNTER — Encounter (HOSPITAL_BASED_OUTPATIENT_CLINIC_OR_DEPARTMENT_OTHER)
Admission: RE | Admit: 2012-11-27 | Discharge: 2012-11-27 | Disposition: A | Discharge status: Home / Self Care | Payer: PRIVATE HEALTH INSURANCE | Source: Ambulatory Visit | Attending: Orthopedic Surgery | Admitting: Orthopedic Surgery

## 2012-11-27 LAB — BASIC METABOLIC PANEL
Chloride: 102 mEq/L (ref 96–112)
GFR calc Af Amer: >90 mL/min (ref >90)
GFR calc non Af Amer: >90 mL/min (ref >90)
Glucose, Bld: 73 mg/dL (ref 70–99)
Potassium: 4.6 mEq/L (ref 3.5–5.1)
Sodium: 139 mEq/L (ref 135–145)

## 2012-11-28 ENCOUNTER — Encounter (HOSPITAL_BASED_OUTPATIENT_CLINIC_OR_DEPARTMENT_OTHER): Payer: Self-pay | Admitting: Certified Registered Nurse Anesthetist

## 2012-11-28 ENCOUNTER — Encounter (HOSPITAL_BASED_OUTPATIENT_CLINIC_OR_DEPARTMENT_OTHER): Payer: Self-pay | Admitting: *Deleted

## 2012-11-28 ENCOUNTER — Ambulatory Visit (HOSPITAL_BASED_OUTPATIENT_CLINIC_OR_DEPARTMENT_OTHER): Payer: PRIVATE HEALTH INSURANCE | Admitting: Certified Registered Nurse Anesthetist

## 2012-11-28 ENCOUNTER — Encounter (HOSPITAL_BASED_OUTPATIENT_CLINIC_OR_DEPARTMENT_OTHER): Payer: Self-pay | Admitting: Orthopedic Surgery

## 2012-11-28 ENCOUNTER — Ambulatory Visit (HOSPITAL_BASED_OUTPATIENT_CLINIC_OR_DEPARTMENT_OTHER)
Admission: RE | Admit: 2012-11-28 | Discharge: 2012-11-28 | Disposition: A | Discharge status: Home / Self Care | Payer: PRIVATE HEALTH INSURANCE | Source: Ambulatory Visit | Attending: Orthopedic Surgery | Admitting: Orthopedic Surgery

## 2012-11-28 ENCOUNTER — Encounter (HOSPITAL_BASED_OUTPATIENT_CLINIC_OR_DEPARTMENT_OTHER)
Admission: RE | Disposition: A | Discharge status: Home / Self Care | Payer: Self-pay | Source: Ambulatory Visit | Attending: Orthopedic Surgery

## 2012-11-28 DIAGNOSIS — M6530 Trigger finger, unspecified finger: Secondary | ICD-10-CM | POA: Insufficient documentation

## 2012-11-28 DIAGNOSIS — Z01812 Encounter for preprocedural laboratory examination: Secondary | ICD-10-CM | POA: Insufficient documentation

## 2012-11-28 DIAGNOSIS — R229 Localized swelling, mass and lump, unspecified: Secondary | ICD-10-CM | POA: Insufficient documentation

## 2012-11-28 DIAGNOSIS — Z01810 Encounter for preprocedural cardiovascular examination: Secondary | ICD-10-CM | POA: Insufficient documentation

## 2012-11-28 HISTORY — PX: TRIGGER FINGER RELEASE: SHX641

## 2012-11-28 LAB — POCT HEMOGLOBIN-HEMACUE: Hemoglobin: 15.7 g/dL (ref 13.0–17.0)

## 2012-11-28 SURGERY — RELEASE, A1 PULLEY, FOR TRIGGER FINGER
Anesthesia: Regional | Site: Finger | Laterality: Left | Wound class: Clean

## 2012-11-28 MED ORDER — MIDAZOLAM HCL 5 MG/5ML IJ SOLN
INTRAMUSCULAR | Status: DC | PRN
  Administered 2012-11-28: 1 mg via INTRAVENOUS

## 2012-11-28 MED ORDER — FENTANYL CITRATE 0.05 MG/ML IJ SOLN
INTRAMUSCULAR | Status: DC | PRN
  Administered 2012-11-28: 50 ug via INTRAVENOUS

## 2012-11-28 MED ORDER — MEPERIDINE HCL 25 MG/ML IJ SOLN: 6.2500 mg | INTRAMUSCULAR | Status: DC | PRN

## 2012-11-28 MED ORDER — ONDANSETRON HCL 4 MG/2ML IJ SOLN: 4.0000 mg | Freq: Once | INTRAMUSCULAR | Status: DC | PRN

## 2012-11-28 MED ORDER — OXYCODONE HCL 5 MG PO TABS: 5.0000 mg | ORAL_TABLET | Freq: Once | ORAL | Status: DC | PRN

## 2012-11-28 MED ORDER — CHLORHEXIDINE GLUCONATE 4 % EX LIQD: 60.0000 mL | Freq: Once | CUTANEOUS | Status: DC

## 2012-11-28 MED ORDER — LIDOCAINE HCL (CARDIAC) 20 MG/ML IV SOLN
INTRAVENOUS | Status: DC | PRN
  Administered 2012-11-28: 30 mg via INTRAVENOUS

## 2012-11-28 MED ORDER — ONDANSETRON HCL 4 MG/2ML IJ SOLN
INTRAMUSCULAR | Status: DC | PRN
  Administered 2012-11-28: 4 mg via INTRAVENOUS

## 2012-11-28 MED ORDER — PENTAZOCINE-NALOXONE 50-0.5 MG PO TABS: 1.0000 | ORAL_TABLET | ORAL | Status: DC | PRN

## 2012-11-28 MED ORDER — LIDOCAINE HCL (PF) 0.5 % IJ SOLN
INTRAMUSCULAR | Status: DC | PRN
  Administered 2012-11-28: 30 mL via INTRAVENOUS

## 2012-11-28 MED ORDER — OXYCODONE HCL 5 MG/5ML PO SOLN: 5.0000 mg | Freq: Once | ORAL | Status: DC | PRN

## 2012-11-28 MED ORDER — BUPIVACAINE HCL (PF) 0.25 % IJ SOLN
INTRAMUSCULAR | Status: DC | PRN
  Administered 2012-11-28: 5 mL

## 2012-11-28 MED ORDER — HYDROMORPHONE HCL PF 1 MG/ML IJ SOLN: 0.2500 mg | INTRAMUSCULAR | Status: DC | PRN

## 2012-11-28 MED ORDER — CEFAZOLIN SODIUM-DEXTROSE 2-3 GM-% IV SOLR
2.0000 g | INTRAVENOUS | Status: AC
  Administered 2012-11-28: 2 g via INTRAVENOUS

## 2012-11-28 MED ORDER — PROPOFOL 10 MG/ML IV EMUL
INTRAVENOUS | Status: DC | PRN
  Administered 2012-11-28: 75 ug/kg/min via INTRAVENOUS

## 2012-11-28 MED ORDER — LACTATED RINGERS IV SOLN
INTRAVENOUS | Status: DC
  Administered 2012-11-28 (×2): via INTRAVENOUS

## 2012-11-28 SURGICAL SUPPLY — 35 items
BANDAGE COBAN STERILE 2 (GAUZE/BANDAGES/DRESSINGS) ×2 IMPLANT
BLADE SURG 15 STRL LF DISP TIS (BLADE) ×1 IMPLANT
BLADE SURG 15 STRL SS (BLADE) ×1
BNDG ESMARK 4X9 LF (GAUZE/BANDAGES/DRESSINGS) IMPLANT
CHLORAPREP W/TINT 26ML (MISCELLANEOUS) ×2 IMPLANT
CLOTH BEACON ORANGE TIMEOUT ST (SAFETY) ×2 IMPLANT
CORDS BIPOLAR (ELECTRODE) IMPLANT
COVER MAYO STAND STRL (DRAPES) ×2 IMPLANT
COVER TABLE BACK 60X90 (DRAPES) ×2 IMPLANT
CUFF TOURNIQUET SINGLE 18IN (TOURNIQUET CUFF) ×2 IMPLANT
DECANTER SPIKE VIAL GLASS SM (MISCELLANEOUS) ×2 IMPLANT
DRAPE EXTREMITY T 121X128X90 (DRAPE) ×2 IMPLANT
DRAPE SURG 17X23 STRL (DRAPES) ×2 IMPLANT
GAUZE XEROFORM 1X8 LF (GAUZE/BANDAGES/DRESSINGS) ×2 IMPLANT
GLOVE BIO SURGEON STRL SZ 6.5 (GLOVE) ×2 IMPLANT
GLOVE BIOGEL PI IND STRL 8.5 (GLOVE) ×1 IMPLANT
GLOVE BIOGEL PI INDICATOR 8.5 (GLOVE) ×1
GLOVE EXAM NITRILE EXT CUFF MD (GLOVE) ×2 IMPLANT
GLOVE SURG ORTHO 8.0 STRL STRW (GLOVE) ×2 IMPLANT
GOWN BRE IMP PREV XXLGXLNG (GOWN DISPOSABLE) ×2 IMPLANT
GOWN PREVENTION PLUS XLARGE (GOWN DISPOSABLE) ×2 IMPLANT
NEEDLE 27GAX1X1/2 (NEEDLE) ×2 IMPLANT
NS IRRIG 1000ML POUR BTL (IV SOLUTION) ×2 IMPLANT
PACK BASIN DAY SURGERY FS (CUSTOM PROCEDURE TRAY) ×2 IMPLANT
PADDING CAST ABS 4INX4YD NS (CAST SUPPLIES)
PADDING CAST ABS COTTON 4X4 ST (CAST SUPPLIES) IMPLANT
SPONGE GAUZE 4X4 12PLY (GAUZE/BANDAGES/DRESSINGS) ×2 IMPLANT
STOCKINETTE 4X48 STRL (DRAPES) ×2 IMPLANT
SUT VICRYL RAPIDE 4/0 PS 2 (SUTURE) ×2 IMPLANT
SYR BULB 3OZ (MISCELLANEOUS) ×2 IMPLANT
SYR CONTROL 10ML LL (SYRINGE) ×2 IMPLANT
TOWEL OR 17X24 6PK STRL BLUE (TOWEL DISPOSABLE) ×2 IMPLANT
TRAY DSU PREP LF (CUSTOM PROCEDURE TRAY) ×2 IMPLANT
UNDERPAD 30X30 INCONTINENT (UNDERPADS AND DIAPERS) ×2 IMPLANT
WATER STERILE IRR 1000ML POUR (IV SOLUTION) IMPLANT
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
## 2012-11-28 NOTE — Op Note (Signed)
NAMECHADWICK, REISWIG NO.:  [id number]  MEDICAL RECORD NO.:  [id number]  LOCATION:                                 FACILITY:  PHYSICIAN:  Cindee Salt, M.D.            DATE OF BIRTH:  DATE OF PROCEDURE:  11/28/2012 DATE OF DISCHARGE:                              OPERATIVE REPORT   PREOPERATIVE DIAGNOSIS:  Mass, left ring finger, metacarpophalangeal joint crease with triggering, left ring finger.  POSTOPERATIVE DIAGNOSIS:  Mass, left ring finger, metacarpophalangeal joint crease with triggering, left ring finger.  OPERATION:  Excision of mass along with release of A1 pulley, left ring finger.  SURGEON:  Cindee Salt, M.D.  ANESTHESIA:  Forearm-based IV regional with local infiltration.  HISTORY:  The patient is a 52 year old male with a history of catching of his left ring finger, the mass on the palmar aspect.  The A1 pulley area.  This has not responded to conservative treatment.  He has elected to undergo surgical excision of the mass with release of the A1 pulley. Pre, peri, and postoperative course have been discussed along with risks and complications.  He is aware that there is no guarantee with surgery; possibility of infection; recurrence of injury to arteries, nerves, tendons, incomplete relief of symptoms, dystrophy.  In the preoperative area, the patient was seen, the extremity marked by both the patient and surgeon.  Antibiotic was given.  PROCEDURE IN DETAIL:  The patient was brought to the operating room, where a forearm-based IV regional anesthetic was carried out without difficulty.  He was prepped using ChloraPrep, supine position, along with Betadine scrub with the left arm free.  A 3-minute dry time was allowed.  Time-out taken, confirming the patient and procedure.  An oblique incision was made over the A1 pulley of left ring finger, carried down through subcutaneous tissue.  Neurovascular structures identified, retractors placed.   Large mass was present on the volar aspect of the A1 pulley.  The entire A1 pulley was excised with this, and sent to Pathology.  Very significant tenosynovitis was present proximally, the appearance superficialis profundus.  These were separated.  The finger placed through a full range of motion.  A very significant abrasion was present on a port 1 limb of the superficialis and this was debrided.  The wound was copiously irrigated with saline. The skin closed with interrupted 4-0 Vicryl Rapide sutures.  Sterile compressive dressing was applied after injection with 0.25% Marcaine without epinephrine, and 5 mL of which was used.  On deflation of the tourniquet, all fingers immediately pinked. He was taken to the recovery room for observation in satisfactory condition.  He will be discharged home to return to [facility] in 1 week, on [place] [facility].          ______________________________ Cindee Salt, M.D.     GK/MEDQ  D:  11/28/2012  T:  11/28/2012  Job:  [id number]

## 2012-11-28 NOTE — Anesthesia Preprocedure Evaluation (Signed)
Anesthesia Evaluation  Patient identified by MRN, date of birth, ID band Patient awake    Reviewed: Allergy & Precautions, H&P , NPO status , Patient's Chart, lab work & pertinent test results  History of Anesthesia Complications (+) PONV  Airway Mallampati: I TM Distance: >3 FB Neck ROM: Full    Dental   Pulmonary          Cardiovascular hypertension, Pt. on medications     Neuro/Psych    GI/Hepatic GERD-  Medicated and Controlled,  Endo/Other  diabetes, Well Controlled, Type 2, Insulin Dependent  Renal/GU      Musculoskeletal   Abdominal   Peds  Hematology   Anesthesia Other Findings   Reproductive/Obstetrics                           Anesthesia Physical Anesthesia Plan  ASA: II  Anesthesia Plan: Bier Block   Post-op Pain Management:    Induction: Intravenous  Airway Management Planned: Natural Airway  Additional Equipment:   Intra-op Plan:   Post-operative Plan:   Informed Consent:   Plan Discussed with: CRNA and Surgeon  Anesthesia Plan Comments:         Anesthesia Quick Evaluation

## 2012-11-28 NOTE — Brief Op Note (Signed)
11/28/2012  9:18 AM  PATIENT:  Russell Gomez  52 y.o. male  PRE-OPERATIVE DIAGNOSIS:  STENOSING TENOSYNOVITIS AND  CYST LEFT RING FINGER  POST-OPERATIVE DIAGNOSIS:  STENOSING TENOSYNOVITIS AND  CYST LEFT RING FINGER  PROCEDURE:  Procedure(s) (LRB) with comments: RELEASE TRIGGER FINGER/A-1 PULLEY (Left) - EXCISION MASS LEFT RING FINGER, RELEASE A-1 PULLEY LEFT RING FINGER  SURGEON:  Surgeon(s) and Role:    * Nicki Reaper, MD - Primary  PHYSICIAN ASSISTANT:   ASSISTANTS: none   ANESTHESIA:   local and regional  EBL:  Total I/O In: 1000 [I.V.:1000] Out: -   BLOOD ADMINISTERED:none  DRAINS: none   LOCAL MEDICATIONS USED:  MARCAINE     SPECIMEN:  Excision  DISPOSITION OF SPECIMEN:  PATHOLOGY  COUNTS:  YES  TOURNIQUET:   Total Tourniquet Time Documented: Forearm (Left) - 26 minutes  DICTATION: .Other Dictation: Dictation Number [id number]  PLAN OF CARE: Discharge to home after PACU  PATIENT DISPOSITION:  PACU - hemodynamically stable.

## 2012-11-28 NOTE — Transfer of Care (Signed)
Immediate Anesthesia Transfer of Care Note  Patient: Russell Gomez  Procedure(s) Performed: Procedure(s) (LRB) with comments: RELEASE TRIGGER FINGER/A-1 PULLEY (Left) - EXCISION MASS LEFT RING FINGER, RELEASE A-1 PULLEY LEFT RING FINGER  Patient Location: PACU  Anesthesia Type:Bier block  Level of Consciousness: awake, alert , oriented and patient cooperative  Airway & Oxygen Therapy: Patient Spontanous Breathing and Patient connected to face mask oxygen  Post-op Assessment: Report given to PACU RN and Post -op Vital signs reviewed and stable  Post vital signs: Reviewed and stable  Complications: No apparent anesthesia complications

## 2012-11-28 NOTE — Anesthesia Postprocedure Evaluation (Signed)
Anesthesia Post Note  Patient: Russell Gomez  Procedure(s) Performed: Procedure(s) (LRB): RELEASE TRIGGER FINGER/A-1 PULLEY (Left)  Anesthesia type: general  Patient location: PACU  Post pain: Pain level controlled  Post assessment: Patient's Cardiovascular Status Stable  Last Vitals:  Filed Vitals:   11/28/12 1015  BP:   Pulse: 74  Temp: 36.7 C  Resp: 16    Post vital signs: Reviewed and stable  Level of consciousness: sedated  Complications: No apparent anesthesia complications

## 2012-11-28 NOTE — Anesthesia Procedure Notes (Signed)
Procedure Name: MAC Date/Time: 11/28/2012 8:43 AM Performed by: Marlane Hirschmann D Pre-anesthesia Checklist: Patient identified, Emergency Drugs available, Suction available, Patient being monitored and Timeout performed Patient Re-evaluated:Patient Re-evaluated prior to inductionOxygen Delivery Method: Simple face mask    Anesthesia Regional Block:  Bier block (IV Regional)  Pre-Anesthetic Checklist: ,, timeout performed, Correct Patient, Correct Site, Correct Laterality, Correct Procedure,, site marked, surgical consent,, at surgeon's request Needles:  Injection technique: Single-shot  Needle Type: Other          Additional Needles: Bier block (IV Regional) Narrative:   Performed by: Personally   Bier block (IV Regional)

## 2012-11-28 NOTE — Op Note (Signed)
Dictation Number [id number]

## 2012-11-29 ENCOUNTER — Encounter (HOSPITAL_BASED_OUTPATIENT_CLINIC_OR_DEPARTMENT_OTHER): Payer: Self-pay | Admitting: Orthopedic Surgery

## 2012-11-30 ENCOUNTER — Encounter: Payer: Self-pay | Admitting: Family Medicine

## 2012-12-02 ENCOUNTER — Other Ambulatory Visit: Payer: Self-pay | Admitting: Family Medicine

## 2012-12-02 DIAGNOSIS — E010 Iodine-deficiency related diffuse (endemic) goiter: Secondary | ICD-10-CM

## 2012-12-02 DIAGNOSIS — E1065 Type 1 diabetes mellitus with hyperglycemia: Secondary | ICD-10-CM

## 2012-12-02 DIAGNOSIS — E041 Nontoxic single thyroid nodule: Secondary | ICD-10-CM

## 2012-12-02 DIAGNOSIS — E785 Hyperlipidemia, unspecified: Secondary | ICD-10-CM

## 2012-12-04 ENCOUNTER — Other Ambulatory Visit: Payer: Self-pay | Admitting: Family Medicine

## 2012-12-04 NOTE — Telephone Encounter (Signed)
OK to refill

## 2012-12-05 ENCOUNTER — Other Ambulatory Visit: Payer: PRIVATE HEALTH INSURANCE

## 2012-12-11 ENCOUNTER — Encounter: Payer: PRIVATE HEALTH INSURANCE | Admitting: Family Medicine

## 2012-12-20 ENCOUNTER — Encounter: Payer: Self-pay | Admitting: Family Medicine

## 2012-12-20 ENCOUNTER — Ambulatory Visit (INDEPENDENT_AMBULATORY_CARE_PROVIDER_SITE_OTHER): Payer: PRIVATE HEALTH INSURANCE | Admitting: Family Medicine

## 2012-12-20 VITALS — BP 132/80 | HR 80 | Temp 98.2°F | Wt 233.0 lb

## 2012-12-20 DIAGNOSIS — M6790 Unspecified disorder of synovium and tendon, unspecified site: Secondary | ICD-10-CM

## 2012-12-20 DIAGNOSIS — F17200 Nicotine dependence, unspecified, uncomplicated: Secondary | ICD-10-CM

## 2012-12-20 DIAGNOSIS — I10 Essential (primary) hypertension: Secondary | ICD-10-CM

## 2012-12-20 DIAGNOSIS — M6789 Other specified disorders of synovium and tendon, multiple sites: Secondary | ICD-10-CM

## 2012-12-20 MED ORDER — AMLODIPINE BESYLATE 10 MG PO TABS: 10.0000 mg | ORAL_TABLET | Freq: Every day | ORAL | Status: DC

## 2012-12-20 NOTE — Assessment & Plan Note (Signed)
Chronic, stable but slightly elevated last several office visits, and pt noticing running slightly above goal at home. Will increase amlodipine to 10mg  daily. Discussed increasing potassium rich foods and discussed DASH diet. Pt will monitor bp at home and notify me if staying elevated or any concerns.

## 2012-12-20 NOTE — Patient Instructions (Signed)
Let's increase amlodipine to 10mg  daily (take 2 until you run out, then take 1 pil of new dose sent to pharmacy) No changes to benazepril. Keep an eye on blood pressure and let me know if any concerns. Good to see you today, call us with questions.  DASH Diet The DASH diet stands for "Dietary Approaches to Stop Hypertension." It is a healthy eating plan that has been shown to reduce high blood pressure (hypertension) in as little as 14 days, while also possibly providing other significant health benefits. These other health benefits include reducing the risk of breast cancer after menopause and reducing the risk of type 2 diabetes, heart disease, colon cancer, and stroke. Health benefits also include weight loss and slowing kidney failure in patients with chronic kidney disease.  DIET GUIDELINES  Limit salt (sodium). Your diet should contain less than 1500 mg of sodium daily.  Limit refined or processed carbohydrates. Your diet should include mostly whole grains. Desserts and added sugars should be used sparingly.  Include small amounts of heart-healthy fats. These types of fats include nuts, oils, and tub margarine. Limit saturated and trans fats. These fats have been shown to be harmful in the body. CHOOSING FOODS  The following food groups are based on a 2000 calorie diet. See your Registered Dietitian for individual calorie needs. Grains and Grain Products (6 to 8 servings daily)  Eat More Often: Whole-wheat bread, brown rice, whole-grain or wheat pasta, quinoa, popcorn without added fat or salt (air popped).  Eat Less Often: White bread, white pasta, white rice, cornbread. Vegetables (4 to 5 servings daily)  Eat More Often: Fresh, frozen, and canned vegetables. Vegetables may be raw, steamed, roasted, or grilled with a minimal amount of fat.  Eat Less Often/Avoid: Creamed or fried vegetables. Vegetables in a cheese sauce. Fruit (4 to 5 servings daily)  Eat More Often: All fresh,  canned (in natural juice), or frozen fruits. Dried fruits without added sugar. One hundred percent fruit juice ( cup [237 mL] daily).  Eat Less Often: Dried fruits with added sugar. Canned fruit in light or heavy syrup. [employer], Fish, and Poultry (2 servings or less daily. One serving is 3 to 4 oz [85-114 g]).  Eat More Often: Ninety percent or leaner ground beef, tenderloin, sirloin. Round cuts of beef, chicken breast, [place] breast. All fish. Grill, bake, or broil your meat. Nothing should be fried.  Eat Less Often/Avoid: Fatty cuts of meat, [place], or chicken leg, thigh, or wing. Fried cuts of meat or fish. Dairy (2 to 3 servings)  Eat More Often: Low-fat or fat-free milk, low-fat plain or light yogurt, reduced-fat or part-skim cheese.  Eat Less Often/Avoid: Milk (whole, 2%).Whole milk yogurt. Full-fat cheeses. Nuts, Seeds, and Legumes (4 to 5 servings per week)  Eat More Often: All without added salt.  Eat Less Often/Avoid: Salted nuts and seeds, canned beans with added salt. Fats and Sweets (limited)  Eat More Often: Vegetable oils, tub margarines without trans fats, sugar-free gelatin. Mayonnaise and salad dressings.  Eat Less Often/Avoid: Coconut oils, palm oils, butter, stick margarine, cream, half and half, cookies, candy, pie. FOR MORE INFORMATION The Dash Diet Eating Plan: www.dashdiet.org Document Released: 10/14/2011 Document Revised: 01/17/2012 Document Reviewed: 10/14/2011 [facility] Patient Information 2013 [place], [place].
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
## 2012-12-20 NOTE — Assessment & Plan Note (Signed)
S/p surgery. Recuperating well.

## 2012-12-20 NOTE — Assessment & Plan Note (Signed)
Continue to encourage cessation. precontemplative. 

## 2013-01-18 ENCOUNTER — Other Ambulatory Visit: Payer: Self-pay | Admitting: *Deleted

## 2013-01-18 MED ORDER — PRAVASTATIN SODIUM 40 MG PO TABS: ORAL_TABLET | ORAL | Status: DC

## 2013-02-02 ENCOUNTER — Telehealth: Payer: Self-pay

## 2013-02-02 ENCOUNTER — Encounter (HOSPITAL_BASED_OUTPATIENT_CLINIC_OR_DEPARTMENT_OTHER): Payer: Self-pay | Admitting: *Deleted

## 2013-02-02 ENCOUNTER — Other Ambulatory Visit: Payer: Self-pay | Admitting: Orthopedic Surgery

## 2013-02-02 MED ORDER — HYDROCHLOROTHIAZIDE 12.5 MG PO CAPS: 12.5000 mg | ORAL_CAPSULE | Freq: Every day | ORAL | Status: DC

## 2013-02-02 NOTE — Telephone Encounter (Signed)
plz clarify with wife - I wanted him to take 2 pills of 5mg .  New prescription he should have picked up is a 10mg  dose so only needs to take 1 pill a day.

## 2013-02-02 NOTE — Addendum Note (Signed)
Addended by: [name] on: 02/02/2013 06:04 PM   Modules accepted: Orders

## 2013-02-02 NOTE — Telephone Encounter (Signed)
Pt left v/m; pt said has been taking Amlodipine 10 mg taking 2 daily for approx one month and BP averaging 140-150/95-100. Spoke with pts wife. No complaint of h/a or dizziness, no CP or SOB. Pt request call back today. [facility] [address]

## 2013-02-02 NOTE — Telephone Encounter (Signed)
Message left advising patient/patient's wife. Instructed to call if further questions.

## 2013-02-02 NOTE — Telephone Encounter (Signed)
pts wife left v/m; pt's amlodipine instructions at [facility] take one daily. Pt's wife said pt was instructed to take 2 a day. Mrs Ziemann request new rx Amlodipine sent to [facility] [address] And request call back.Please advise.

## 2013-02-02 NOTE — Pre-Procedure Instructions (Signed)
To come for BMET 

## 2013-02-02 NOTE — Telephone Encounter (Addendum)
Left message - advised to only take amlodipine 10mg  once daily. I have sent in new blood pressure medicine - hydrochlorothiazide to take 12.5mg  daily.

## 2013-02-05 ENCOUNTER — Encounter (HOSPITAL_BASED_OUTPATIENT_CLINIC_OR_DEPARTMENT_OTHER)
Admission: RE | Admit: 2013-02-05 | Discharge: 2013-02-05 | Disposition: A | Discharge status: Home / Self Care | Payer: PRIVATE HEALTH INSURANCE | Source: Ambulatory Visit | Attending: Orthopedic Surgery | Admitting: Orthopedic Surgery

## 2013-02-05 LAB — BASIC METABOLIC PANEL
Chloride: 100 mEq/L (ref 96–112)
Creatinine, Ser: 0.61 mg/dL (ref 0.50–1.35)
GFR calc Af Amer: >90 mL/min (ref >90)
GFR calc non Af Amer: >90 mL/min (ref >90)
Potassium: 4.8 mEq/L (ref 3.5–5.1)

## 2013-02-06 ENCOUNTER — Ambulatory Visit (HOSPITAL_BASED_OUTPATIENT_CLINIC_OR_DEPARTMENT_OTHER): Payer: PRIVATE HEALTH INSURANCE | Admitting: Anesthesiology

## 2013-02-06 ENCOUNTER — Encounter (HOSPITAL_BASED_OUTPATIENT_CLINIC_OR_DEPARTMENT_OTHER): Payer: Self-pay | Admitting: Orthopedic Surgery

## 2013-02-06 ENCOUNTER — Encounter (HOSPITAL_BASED_OUTPATIENT_CLINIC_OR_DEPARTMENT_OTHER)
Admission: RE | Disposition: A | Discharge status: Home / Self Care | Payer: Self-pay | Source: Ambulatory Visit | Attending: Orthopedic Surgery

## 2013-02-06 ENCOUNTER — Ambulatory Visit (HOSPITAL_BASED_OUTPATIENT_CLINIC_OR_DEPARTMENT_OTHER)
Admission: RE | Admit: 2013-02-06 | Discharge: 2013-02-06 | Disposition: A | Discharge status: Home / Self Care | Payer: PRIVATE HEALTH INSURANCE | Source: Ambulatory Visit | Attending: Orthopedic Surgery | Admitting: Orthopedic Surgery

## 2013-02-06 ENCOUNTER — Encounter (HOSPITAL_BASED_OUTPATIENT_CLINIC_OR_DEPARTMENT_OTHER): Payer: Self-pay | Admitting: Anesthesiology

## 2013-02-06 DIAGNOSIS — X58XXXA Exposure to other specified factors, initial encounter: Secondary | ICD-10-CM | POA: Insufficient documentation

## 2013-02-06 DIAGNOSIS — IMO0002 Reserved for concepts with insufficient information to code with codable children: Secondary | ICD-10-CM | POA: Insufficient documentation

## 2013-02-06 HISTORY — PX: PERCUTANEOUS PINNING: SHX2209

## 2013-02-06 HISTORY — DX: Pure hypercholesterolemia, unspecified: E78.00

## 2013-02-06 HISTORY — DX: Dental restoration status: Z98.811

## 2013-02-06 LAB — GLUCOSE, CAPILLARY
Glucose-Capillary: 158 mg/dL — ABNORMAL HIGH (ref 70–99)
Glucose-Capillary: 189 mg/dL — ABNORMAL HIGH (ref 70–99)

## 2013-02-06 SURGERY — PINNING, EXTREMITY, PERCUTANEOUS
Anesthesia: General | Site: Finger | Laterality: Left | Wound class: Clean

## 2013-02-06 MED ORDER — FENTANYL CITRATE 0.05 MG/ML IJ SOLN
INTRAMUSCULAR | Status: DC | PRN
  Administered 2013-02-06: 100 ug via INTRAVENOUS

## 2013-02-06 MED ORDER — MIDAZOLAM HCL 2 MG/2ML IJ SOLN: 1.0000 mg | INTRAMUSCULAR | Status: DC | PRN

## 2013-02-06 MED ORDER — MIDAZOLAM HCL 2 MG/ML PO SYRP: 12.0000 mg | ORAL_SOLUTION | Freq: Once | ORAL | Status: DC | PRN

## 2013-02-06 MED ORDER — OXYCODONE HCL 5 MG PO TABS
5.0000 mg | ORAL_TABLET | Freq: Once | ORAL | Status: DC | PRN
Start: 2013-02-06 — End: 2013-02-06

## 2013-02-06 MED ORDER — FENTANYL CITRATE 0.05 MG/ML IJ SOLN: 25.0000 ug | INTRAMUSCULAR | Status: DC | PRN

## 2013-02-06 MED ORDER — CHLORHEXIDINE GLUCONATE 4 % EX LIQD: 60.0000 mL | Freq: Once | CUTANEOUS | Status: DC

## 2013-02-06 MED ORDER — ACETAMINOPHEN 10 MG/ML IV SOLN
1000.0000 mg | Freq: Once | INTRAVENOUS | Status: AC
  Administered 2013-02-06: 1000 mg via INTRAVENOUS

## 2013-02-06 MED ORDER — LACTATED RINGERS IV SOLN
INTRAVENOUS | Status: DC
  Administered 2013-02-06 (×2): via INTRAVENOUS

## 2013-02-06 MED ORDER — OXYCODONE HCL 5 MG/5ML PO SOLN: 5.0000 mg | Freq: Once | ORAL | Status: DC | PRN

## 2013-02-06 MED ORDER — FENTANYL CITRATE 0.05 MG/ML IJ SOLN: 50.0000 ug | INTRAMUSCULAR | Status: DC | PRN

## 2013-02-06 MED ORDER — BUPIVACAINE HCL (PF) 0.25 % IJ SOLN
INTRAMUSCULAR | Status: DC | PRN
  Administered 2013-02-06: 8 mL

## 2013-02-06 MED ORDER — ONDANSETRON HCL 4 MG/2ML IJ SOLN
INTRAMUSCULAR | Status: DC | PRN
  Administered 2013-02-06: 4 mg via INTRAVENOUS

## 2013-02-06 MED ORDER — CEFAZOLIN SODIUM-DEXTROSE 2-3 GM-% IV SOLR
2.0000 g | INTRAVENOUS | Status: AC
  Administered 2013-02-06: 2 g via INTRAVENOUS

## 2013-02-06 MED ORDER — MIDAZOLAM HCL 2 MG/2ML IJ SOLN: 0.5000 mg | Freq: Once | INTRAMUSCULAR | Status: DC | PRN

## 2013-02-06 MED ORDER — MEPERIDINE HCL 25 MG/ML IJ SOLN: 6.2500 mg | INTRAMUSCULAR | Status: DC | PRN

## 2013-02-06 MED ORDER — PROPOFOL 10 MG/ML IV BOLUS
INTRAVENOUS | Status: DC | PRN
  Administered 2013-02-06: 200 mg via INTRAVENOUS
  Administered 2013-02-06: 100 mg via INTRAVENOUS

## 2013-02-06 MED ORDER — MIDAZOLAM HCL 5 MG/5ML IJ SOLN
INTRAMUSCULAR | Status: DC | PRN
  Administered 2013-02-06: 2 mg via INTRAVENOUS

## 2013-02-06 MED ORDER — HYDROCODONE-ACETAMINOPHEN 7.5-325 MG PO TABS: 1.0000 | ORAL_TABLET | Freq: Four times a day (QID) | ORAL | Status: DC | PRN

## 2013-02-06 MED ORDER — CEFAZOLIN SODIUM-DEXTROSE 2-3 GM-% IV SOLR: 2.0000 g | INTRAVENOUS | Status: DC

## 2013-02-06 MED ORDER — LIDOCAINE HCL (CARDIAC) 20 MG/ML IV SOLN
INTRAVENOUS | Status: DC | PRN
  Administered 2013-02-06: 50 mg via INTRAVENOUS

## 2013-02-06 MED ORDER — PROMETHAZINE HCL 25 MG/ML IJ SOLN: 6.2500 mg | INTRAMUSCULAR | Status: DC | PRN

## 2013-02-06 SURGICAL SUPPLY — 42 items
BANDAGE GAUZE ELAST BULKY 4 IN (GAUZE/BANDAGES/DRESSINGS) ×2 IMPLANT
BLADE SURG 15 STRL LF DISP TIS (BLADE) ×1 IMPLANT
BLADE SURG 15 STRL SS (BLADE) ×1
BNDG COHESIVE 1X5 TAN STRL LF (GAUZE/BANDAGES/DRESSINGS) ×2 IMPLANT
BNDG COHESIVE 3X5 TAN STRL LF (GAUZE/BANDAGES/DRESSINGS) IMPLANT
BNDG ESMARK 4X9 LF (GAUZE/BANDAGES/DRESSINGS) IMPLANT
CHLORAPREP W/TINT 26ML (MISCELLANEOUS) ×2 IMPLANT
CLOTH BEACON ORANGE TIMEOUT ST (SAFETY) ×2 IMPLANT
CORDS BIPOLAR (ELECTRODE) IMPLANT
COVER MAYO STAND STRL (DRAPES) ×2 IMPLANT
COVER TABLE BACK 60X90 (DRAPES) ×2 IMPLANT
CUFF TOURNIQUET SINGLE 18IN (TOURNIQUET CUFF) ×2 IMPLANT
DRAPE EXTREMITY T 121X128X90 (DRAPE) ×2 IMPLANT
DRAPE OEC MINIVIEW 54X84 (DRAPES) ×2 IMPLANT
DRSG KUZMA FLUFF (GAUZE/BANDAGES/DRESSINGS) ×2 IMPLANT
GAUZE SPONGE 4X4 16PLY XRAY LF (GAUZE/BANDAGES/DRESSINGS) IMPLANT
GAUZE XEROFORM 1X8 LF (GAUZE/BANDAGES/DRESSINGS) ×2 IMPLANT
GLOVE BIO SURGEON STRL SZ 6.5 (GLOVE) ×4 IMPLANT
GLOVE BIOGEL PI IND STRL 8 (GLOVE) ×1 IMPLANT
GLOVE BIOGEL PI IND STRL 8.5 (GLOVE) ×1 IMPLANT
GLOVE BIOGEL PI INDICATOR 8 (GLOVE) ×1
GLOVE BIOGEL PI INDICATOR 8.5 (GLOVE) ×1
GLOVE EXAM NITRILE EXT CUFF MD (GLOVE) ×2 IMPLANT
GLOVE INDICATOR 7.0 STRL GRN (GLOVE) ×2 IMPLANT
GLOVE SURG ORTHO 8.0 STRL STRW (GLOVE) ×2 IMPLANT
GOWN BRE IMP PREV XXLGXLNG (GOWN DISPOSABLE) ×2 IMPLANT
GOWN PREVENTION PLUS XLARGE (GOWN DISPOSABLE) ×2 IMPLANT
K-WIRE .035X4 (WIRE) ×2 IMPLANT
NEEDLE 27GAX1X1/2 (NEEDLE) IMPLANT
NS IRRIG 1000ML POUR BTL (IV SOLUTION) IMPLANT
PACK BASIN DAY SURGERY FS (CUSTOM PROCEDURE TRAY) ×2 IMPLANT
PAD CAST 3X4 CTTN HI CHSV (CAST SUPPLIES) ×1 IMPLANT
PADDING CAST COTTON 3X4 STRL (CAST SUPPLIES) ×1
SPLINT FINGER 5/8X2.25 (CAST SUPPLIES) ×1 IMPLANT
SPLINT PLASTALUME 2 1/4 (CAST SUPPLIES) ×2
SPLINT PLASTER CAST XFAST 3X15 (CAST SUPPLIES) IMPLANT
SPLINT PLASTER XTRA FASTSET 3X (CAST SUPPLIES)
SPONGE GAUZE 4X4 12PLY (GAUZE/BANDAGES/DRESSINGS) ×4 IMPLANT
STOCKINETTE 4X48 STRL (DRAPES) ×2 IMPLANT
SUT VICRYL RAPID 5 0 P 3 (SUTURE) IMPLANT
SUT VICRYL RAPIDE 4/0 PS 2 (SUTURE) ×2 IMPLANT
SYR BULB 3OZ (MISCELLANEOUS) ×2 IMPLANT

## 2013-02-06 NOTE — Brief Op Note (Signed)
02/06/2013  9:13 AM  PATIENT:  Palma Holter  52 y.o. male  PRE-OPERATIVE DIAGNOSIS:  Fracture dislocation PIP Left middle Finger  POST-OPERATIVE DIAGNOSIS:  Fracture dislocation PIP Left middle Finger  PROCEDURE:  Procedure(s): PINNING PIP OF THE LEFT MIDDLE FINGER  (Left)  SURGEON:  Surgeon(s) and Role:    * Nicki Reaper, MD - Primary    * Tami Ribas, MD - Assisting  PHYSICIAN ASSISTANT:   ASSISTANTS: K Arlissa Monteverde,MD   ANESTHESIA:   local and general  EBL:  Total I/O In: 1000 [I.V.:1000] Out: -   BLOOD ADMINISTERED:none  DRAINS: none  LOCAL MEDICATIONS USED:  MARCAINE     SPECIMEN:  No Specimen  DISPOSITION OF SPECIMEN:  N/A  COUNTS:  YES  TOURNIQUET:    DICTATION: .Other Dictation: Dictation Number [id number]  PLAN OF CARE: Discharge to home after PACU  PATIENT DISPOSITION:  PACU - hemodynamically stable.

## 2013-02-06 NOTE — Anesthesia Postprocedure Evaluation (Signed)
  Anesthesia Post-op Note  Patient: Russell Gomez  Procedure(s) Performed: Procedure(s): PINNING PIP OF THE LEFT MIDDLE FINGER  (Left)  Patient Location: PACU  Anesthesia Type:General  Level of Consciousness: awake, alert , oriented and patient cooperative  Airway and Oxygen Therapy: Patient Spontanous Breathing  Post-op Pain: none  Post-op Assessment: Post-op Vital signs reviewed, Patient's Cardiovascular Status Stable, Respiratory Function Stable, Patent Airway, No signs of Nausea or vomiting and Pain level controlled  Post-op Vital Signs: Reviewed and stable  Complications: No apparent anesthesia complications

## 2013-02-06 NOTE — Transfer of Care (Signed)
Immediate Anesthesia Transfer of Care Note  Patient: Russell Gomez  Procedure(s) Performed: Procedure(s): PINNING PIP OF THE LEFT MIDDLE FINGER  (Left)  Patient Location: PACU  Anesthesia Type:General  Level of Consciousness: awake, alert  and oriented  Airway & Oxygen Therapy: Patient Spontanous Breathing and Patient connected to face mask oxygen  Post-op Assessment: Report given to PACU RN and Post -op Vital signs reviewed and stable  Post vital signs: Reviewed and stable  Complications: No apparent anesthesia complications

## 2013-02-06 NOTE — Op Note (Signed)
Dictation Number [id number]

## 2013-02-06 NOTE — Anesthesia Preprocedure Evaluation (Addendum)
Anesthesia Evaluation  Patient identified by MRN, date of birth, ID band Patient awake    Reviewed: Allergy & Precautions, H&P , NPO status , Patient's Chart, lab work & pertinent test results  History of Anesthesia Complications Negative for: history of anesthetic complications  Airway       Dental   Pulmonary Current Smoker,          Cardiovascular hypertension,     Neuro/Psych    GI/Hepatic   Endo/Other    Renal/GU      Musculoskeletal   Abdominal   Peds  Hematology   Anesthesia Other Findings   Reproductive/Obstetrics                          Anesthesia Physical Anesthesia Plan Anesthesia Quick Evaluation

## 2013-02-06 NOTE — Anesthesia Procedure Notes (Signed)
Procedure Name: LMA Insertion Date/Time: 02/06/2013 8:40 AM Performed by: Burna Cash Pre-anesthesia Checklist: Patient identified, Emergency Drugs available, Suction available and Patient being monitored Patient Re-evaluated:Patient Re-evaluated prior to inductionOxygen Delivery Method: Circle System Utilized Preoxygenation: Pre-oxygenation with 100% oxygen Intubation Type: IV induction Ventilation: Mask ventilation without difficulty LMA: LMA inserted LMA Size: 5.0 Number of attempts: 1 Airway Equipment and Method: bite block Placement Confirmation: positive ETCO2 Tube secured with: Tape Dental Injury: Teeth and Oropharynx as per pre-operative assessment
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
## 2013-02-07 NOTE — Op Note (Signed)
[name], [name] NO.:  [id number]  MEDICAL RECORD NO.:  [id number]  LOCATION:                                 FACILITY:  PHYSICIAN:  Cindee Salt, M.D.            DATE OF BIRTH:  DATE OF PROCEDURE:  02/06/2013 DATE OF DISCHARGE:                              OPERATIVE REPORT   PREOPERATIVE DIAGNOSIS:  Fracture dislocation of proximal interphalangeal joint, left middle finger.  POSTOPERATIVE DIAGNOSIS:  Fracture dislocation of proximal interphalangeal joint, left middle finger.  OPERATION:  Manipulation closed pinning of proximal interphalangeal joint left middle finger.  SURGEON:  Cindee Salt, M.D.  ANESTHESIA:  General with metacarpal block.  ANESTHESIOLOGISTJean Rosenthal.  HISTORY:  The patient is a 52 year old male who suffered a fracture dislocation of his left middle finger.  This has not been able to be maintained with a close manipulation of extension block splinting.  He is admitted for percutaneous pinning, possible open reduction, internal fixation, PIP joint left middle finger.  He is aware of risks and complications including infection; recurrence of injury to arteries, nerves, tendons, incomplete relief of symptoms, dystrophy, possibility of stiffness, possibility of redislocation necessitating use of volar plate arthroplasty or hemi-hamate arthroplasty left little finger.  In preoperative area, the patient is seen, the extremity marked by both the patient and surgeon.  Antibiotic given.  PROCEDURE:  The patient was brought to the operating room and general anesthetic carried out without difficulty.  He was prepped using ChloraPrep, supine position with the left arm free.  A 3-minute dry time was allowed.  Time-out taken, confirming the patient and procedure.  The finger was manipulated under image intensification.  This was reduced. A 3.5 K-wire was then passed in a retrograde manner through the middle phalanx into the proximal phalanx  maintaining the finger at approximately 45 degrees of flexion with relocation both fracture fragment and joint.  The pin was bent cut short.  AP and lateral x-rays revealed the joint in anatomic position.  A sterile compressive dressing and dorsal splint applied, deflation of the tourniquet.  The tourniquet was not inflated.  The patient tolerated the procedure well and was taken to the recovery room for observation in satisfactory condition. He will be discharged home to [facility] in 1 week on [place].         ______________________________ Cindee Salt, M.D.    GK/MEDQ  D:  02/06/2013  T:  02/06/2013  Job:  [id number]

## 2013-02-08 ENCOUNTER — Encounter (HOSPITAL_BASED_OUTPATIENT_CLINIC_OR_DEPARTMENT_OTHER): Payer: Self-pay | Admitting: Orthopedic Surgery

## 2013-02-17 ENCOUNTER — Other Ambulatory Visit: Payer: Self-pay | Admitting: Family Medicine

## 2013-04-03 LAB — BASIC METABOLIC PANEL
Creat: 0.7
Glucose: 271
Sodium: 135 mmol/L — AB (ref 137–147)

## 2013-04-03 LAB — HEMOGLOBIN A1C: A1c: 7.8

## 2013-04-15 ENCOUNTER — Encounter: Payer: Self-pay | Admitting: Family Medicine

## 2013-04-17 ENCOUNTER — Encounter: Payer: Self-pay | Admitting: Family Medicine

## 2013-05-07 ENCOUNTER — Encounter: Payer: Self-pay | Admitting: Internal Medicine

## 2013-05-08 ENCOUNTER — Other Ambulatory Visit: Payer: PRIVATE HEALTH INSURANCE

## 2013-06-18 ENCOUNTER — Telehealth: Payer: Self-pay | Admitting: *Deleted

## 2013-06-18 NOTE — Telephone Encounter (Signed)
Refill request for mobic 15 mg 1 PO QD. Not on med list past or present. Ok to refill?

## 2013-06-19 MED ORDER — MELOXICAM 7.5 MG PO TABS: 7.5000 mg | ORAL_TABLET | Freq: Every day | ORAL | Status: DC | PRN

## 2013-06-19 NOTE — Telephone Encounter (Signed)
I've sent in lower dose to take prn pain. Lab Results  Component Value Date   CREATININE 0.7 04/03/2013

## 2013-07-06 ENCOUNTER — Other Ambulatory Visit: Payer: Self-pay | Admitting: Family Medicine

## 2013-08-17 ENCOUNTER — Other Ambulatory Visit: Payer: Self-pay | Admitting: Family Medicine

## 2013-08-17 NOTE — Telephone Encounter (Signed)
Ok to refill 

## 2013-08-20 ENCOUNTER — Other Ambulatory Visit: Payer: Self-pay | Admitting: Family Medicine

## 2013-10-19 ENCOUNTER — Other Ambulatory Visit: Payer: Self-pay | Admitting: Family Medicine

## 2013-11-11 ENCOUNTER — Other Ambulatory Visit: Payer: Self-pay | Admitting: Family Medicine

## 2013-11-15 ENCOUNTER — Telehealth: Payer: Self-pay | Admitting: *Deleted

## 2013-11-15 MED ORDER — METOCLOPRAMIDE HCL 10 MG PO TABS: ORAL_TABLET | ORAL | Status: DC

## 2013-11-15 NOTE — Telephone Encounter (Signed)
Eprescribing error.  plz phone in.

## 2013-11-15 NOTE — Telephone Encounter (Signed)
Ok to refill 

## 2013-11-16 ENCOUNTER — Other Ambulatory Visit: Payer: Self-pay | Admitting: *Deleted

## 2013-11-16 MED ORDER — ENALAPRIL MALEATE 20 MG PO TABS: ORAL_TABLET | ORAL | Status: DC

## 2013-11-16 MED ORDER — METOCLOPRAMIDE HCL 10 MG PO TABS: ORAL_TABLET | ORAL | Status: DC

## 2013-11-16 MED ORDER — OMEPRAZOLE 40 MG PO CPDR: DELAYED_RELEASE_CAPSULE | ORAL | Status: DC

## 2013-11-16 MED ORDER — HYDROCHLOROTHIAZIDE 12.5 MG PO CAPS: 12.5000 mg | ORAL_CAPSULE | Freq: Every day | ORAL | Status: DC

## 2013-11-16 NOTE — Telephone Encounter (Signed)
Refilled

## 2013-11-16 NOTE — Addendum Note (Signed)
Addended by: Royann Shivers A on: 11/16/2013 03:51 PM   Modules accepted: Orders

## 2013-11-19 ENCOUNTER — Other Ambulatory Visit: Payer: Self-pay | Admitting: *Deleted

## 2013-11-19 NOTE — Telephone Encounter (Signed)
Faxed refill request. Last refill indicated that patient needs OV.  Please advise.

## 2013-11-20 MED ORDER — OMEPRAZOLE 40 MG PO CPDR: DELAYED_RELEASE_CAPSULE | ORAL | Status: DC

## 2013-11-20 MED ORDER — METOCLOPRAMIDE HCL 10 MG PO TABS: ORAL_TABLET | ORAL | Status: DC

## 2013-11-20 MED ORDER — HYDROCHLOROTHIAZIDE 12.5 MG PO CAPS: 12.5000 mg | ORAL_CAPSULE | Freq: Every day | ORAL | Status: DC

## 2013-11-20 MED ORDER — ENALAPRIL MALEATE 20 MG PO TABS: ORAL_TABLET | ORAL | Status: DC

## 2013-12-14 ENCOUNTER — Telehealth: Payer: Self-pay

## 2013-12-14 NOTE — Telephone Encounter (Signed)
Spoke with pts wife; pt has medication now but needs to change all med refills to [facility]. Pt needs to schedule appt for med refill. Pts wife said pt will call back to schedule appt; pt is not out of any meds at this time.

## 2013-12-19 ENCOUNTER — Other Ambulatory Visit: Payer: Self-pay | Admitting: Family Medicine

## 2014-01-04 ENCOUNTER — Ambulatory Visit (INDEPENDENT_AMBULATORY_CARE_PROVIDER_SITE_OTHER): Payer: Commercial Indemnity | Admitting: Internal Medicine

## 2014-01-04 ENCOUNTER — Encounter: Payer: Self-pay | Admitting: Internal Medicine

## 2014-01-04 VITALS — BP 136/82 | HR 98 | Temp 98.2°F | Wt 220.0 lb

## 2014-01-04 DIAGNOSIS — B9789 Other viral agents as the cause of diseases classified elsewhere: Principal | ICD-10-CM

## 2014-01-04 DIAGNOSIS — J069 Acute upper respiratory infection, unspecified: Secondary | ICD-10-CM

## 2014-01-04 NOTE — Progress Notes (Signed)
Pre visit review using our clinic review tool, if applicable. No additional management support is needed unless otherwise documented below in the visit note. 
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
## 2014-01-04 NOTE — Patient Instructions (Addendum)

## 2014-01-07 ENCOUNTER — Telehealth: Payer: Self-pay | Admitting: Family Medicine

## 2014-01-07 NOTE — Telephone Encounter (Signed)
Relevant patient education mailed to patient.  

## 2014-01-09 ENCOUNTER — Ambulatory Visit (INDEPENDENT_AMBULATORY_CARE_PROVIDER_SITE_OTHER)
Admission: RE | Admit: 2014-01-09 | Discharge: 2014-01-09 | Disposition: A | Discharge status: Home / Self Care | Payer: Commercial Indemnity | Source: Ambulatory Visit | Attending: Family Medicine | Admitting: Family Medicine

## 2014-01-09 ENCOUNTER — Encounter: Payer: Self-pay | Admitting: Family Medicine

## 2014-01-09 ENCOUNTER — Encounter: Payer: Self-pay | Admitting: *Deleted

## 2014-01-09 ENCOUNTER — Telehealth: Payer: Self-pay | Admitting: Family Medicine

## 2014-01-09 ENCOUNTER — Ambulatory Visit (INDEPENDENT_AMBULATORY_CARE_PROVIDER_SITE_OTHER): Payer: Commercial Indemnity | Admitting: Family Medicine

## 2014-01-09 VITALS — BP 142/86 | HR 108 | Temp 98.1°F | Wt 214.8 lb

## 2014-01-09 DIAGNOSIS — R112 Nausea with vomiting, unspecified: Secondary | ICD-10-CM | POA: Insufficient documentation

## 2014-01-09 DIAGNOSIS — R059 Cough, unspecified: Secondary | ICD-10-CM

## 2014-01-09 DIAGNOSIS — R05 Cough: Secondary | ICD-10-CM | POA: Insufficient documentation

## 2014-01-09 DIAGNOSIS — E119 Type 2 diabetes mellitus without complications: Secondary | ICD-10-CM

## 2014-01-09 DIAGNOSIS — F17200 Nicotine dependence, unspecified, uncomplicated: Secondary | ICD-10-CM

## 2014-01-09 DIAGNOSIS — IMO0002 Reserved for concepts with insufficient information to code with codable children: Secondary | ICD-10-CM

## 2014-01-09 DIAGNOSIS — E1065 Type 1 diabetes mellitus with hyperglycemia: Secondary | ICD-10-CM

## 2014-01-09 LAB — GLUCOSE, POCT (MANUAL RESULT ENTRY): POC Glucose: 175 mg/dl — AB (ref 70–99)

## 2014-01-09 MED ORDER — ONDANSETRON HCL 4 MG PO TABS: 4.0000 mg | ORAL_TABLET | Freq: Three times a day (TID) | ORAL | Status: DC | PRN

## 2014-01-09 MED ORDER — PROMETHAZINE HCL 25 MG/ML IJ SOLN
25.0000 mg | Freq: Once | INTRAMUSCULAR | Status: AC
  Administered 2014-01-09: 25 mg via INTRAMUSCULAR

## 2014-01-09 MED ORDER — ALBUTEROL SULFATE HFA 108 (90 BASE) MCG/ACT IN AERS: 2.0000 | INHALATION_SPRAY | Freq: Four times a day (QID) | RESPIRATORY_TRACT | Status: DC | PRN

## 2014-01-09 NOTE — Patient Instructions (Addendum)
Let's decrease enalapril to once daily and let's hold meloxicam - while you're not feeling well. Continue omeprazole and increase reglan to twice daily. Treat nausea with zofran 4mg  as needed - and phenergan shot provided today. Xray today - lungs looking overall clear. I'd like to treat your cough with albuterol inhaler - caution it may make you jittery and your heart race. I want you to push small sips of water throughout the day. If worsening, or still not able to keep fluids down even with meds, I recommend evaluation at ER or urgent care. Given your lung sounds, I worry about developing COPD.

## 2014-01-09 NOTE — Assessment & Plan Note (Addendum)
Possible mild COPD exacerbation in chronic smoker - but seems to be improving on its own. Treat with albuterol inhaler prn - discussed MDI use, discussed jittery/tachycardia side effect of med. Avoid prednisone 2/2 T1DM hx. Do not see need for abx currently. Discussed importance of quitting smoking. CXR today - no PNA on my read.

## 2014-01-09 NOTE — Telephone Encounter (Signed)
Relevant patient education mailed to patient.  

## 2014-01-09 NOTE — Assessment & Plan Note (Signed)
Deteriorated control with recent illness - discussed decreased lantus with decreased PO intake.

## 2014-01-09 NOTE — Assessment & Plan Note (Signed)
Continue to encourage cessation.  Actively cutting down.  Worried about possible COPD dx.

## 2014-01-09 NOTE — Addendum Note (Signed)
Addended by: Royann Shivers A on: 01/09/2014 11:08 AM   Modules accepted: Orders

## 2014-01-09 NOTE — Assessment & Plan Note (Signed)
viral related, COPD exac related, or possible DM gastroparesis related. Will treat with phenergan shot today, and sent zofran to pharmacy for his use at home. Advised increased reglan to 10mg  bid. Anticipate mild dehydration today - discussed red flags to seek ER or urgent care. Pt agrees with plan, has f/u appt scheduled next week.

## 2014-01-09 NOTE — Progress Notes (Signed)
Pre visit review using our clinic review tool, if applicable. No additional management support is needed unless otherwise documented below in the visit note. 

## 2014-01-09 NOTE — Progress Notes (Signed)
BP 142/86  Pulse 108  Temp(Src) 98.1 F (36.7 C) (Oral)  Wt 214 lb 12 oz (97.41 kg)   CC: vomiting  Subjective:    Patient ID: Russell Gomez, male    DOB: 05-07-1961, 53 y.o.   MRN: [id number]  HPI: Russell Gomez is a 53 y.o. male presenting on 01/09/2014 with Emesis   H/o T1DM followed by endo Dr. Eddie Dibbles at [facility], HTN, HLD, GERD and goiter.  On reglan PRN.  cbg in office today 175.  Pt was seen at [facility] clinic with dx sinusitis and treated with augmentin course.  Seen here last week by Rollene Fare, thought more consistent with viral URI with cough, rec supportive care.  Low grade fever last week - Tmax 100.6.    Over the last week started feeling better, then yesterday worsened acutely with nausea/vomiting. Tried to go back to work last night - sweats and vomiting - unable to keep fluids down last night - sent home.   6 lb weight loss since last week. Keeping meds down ok.   Nauseated with any intake.  Has been taking reglan 1 pill daily regularly.    + productive cough with thick mucous, + PNdrainage.  + more mucous than normal. No significant congestion, no abd pain or diarrhea, denies worsening GERD.   Not more short winded than normal, not more wheezing (last week he was these things).  Wt Readings from Last 3 Encounters:  01/09/14 214 lb 12 oz (97.41 kg)  01/04/14 220 lb (99.791 kg)  02/06/13 226 lb (102.513 kg)   Body mass index is 27.56 kg/(m^2).  Relevant past medical, surgical, family and social history reviewed and updated as indicated.  Allergies and medications reviewed and updated. Current Outpatient Prescriptions on File Prior to Visit  Medication Sig  . amLODipine (NORVASC) 10 MG tablet TAKE ONE TABLET BY MOUTH ONCE DAILY  . enalapril (VASOTEC) 20 MG tablet TAKE TWO TABLETS BY MOUTH ONCE DAILY  . hydrochlorothiazide (MICROZIDE) 12.5 MG capsule Take 1 capsule (12.5 mg total) by mouth daily.  . insulin glargine (LANTUS) 100 UNIT/ML injection Inject 23 Units into  the skin daily with breakfast.   . insulin regular (HUMULIN R,NOVOLIN R) 100 UNIT/ML injection Inject into the skin as directed. Sliding scale (5 units with meals)  . meloxicam (MOBIC) 7.5 MG tablet Take 1 tablet (7.5 mg total) by mouth daily as needed for pain.  Marland Kitchen metoCLOPramide (REGLAN) 10 MG tablet TAKE ONE TABLET BY MOUTH TWICE DAILY AS NEEDED  . omeprazole (PRILOSEC) 40 MG capsule TAKE ONE CAPSULE BY MOUTH EVERY DAY -  MAY  TAKE  2  CAPSULES  . pravastatin (PRAVACHOL) 40 MG tablet TAKE ONE TABLET BY MOUTH ONCE DAILY *NEEDS APPOINTMENT FOR FASTING LABS FOR ANY ADDITIONAL REFILLS*  . HYDROcodone-acetaminophen (NORCO) 7.5-325 MG per tablet Take 1 tablet by mouth every 6 (six) hours as needed for pain.   No current facility-administered medications on file prior to visit.    Review of Systems Per HPI unless specifically indicated above    Objective:    BP 142/86  Pulse 108  Temp(Src) 98.1 F (36.7 C) (Oral)  Wt 214 lb 12 oz (97.41 kg)  Physical Exam  Nursing note and vitals reviewed. Constitutional: He appears well-developed and well-nourished. No distress.  HENT:  Head: Normocephalic and atraumatic.  Right Ear: Hearing, tympanic membrane, external ear and ear canal normal.  Left Ear: Hearing, tympanic membrane, external ear and ear canal normal.  Nose: No mucosal edema  or rhinorrhea. Right sinus exhibits maxillary sinus tenderness. Right sinus exhibits no frontal sinus tenderness. Left sinus exhibits no maxillary sinus tenderness and no frontal sinus tenderness.  Mouth/Throat: Uvula is midline and mucous membranes are normal. Posterior oropharyngeal edema and posterior oropharyngeal erythema present. No oropharyngeal exudate or tonsillar abscesses.  Eyes: Conjunctivae and EOM are normal. Pupils are equal, round, and reactive to light. No scleral icterus.  Neck: Normal range of motion. Neck supple.  Cardiovascular: Normal rate, regular rhythm, normal heart sounds and intact distal  pulses.   No murmur heard. Pulmonary/Chest: Effort normal. No respiratory distress. He has decreased breath sounds. He has wheezes (exp). He has rhonchi (diffusely scattered) in the right upper field and the left upper field.  Lymphadenopathy:    He has no cervical adenopathy.  Skin: Skin is warm and dry. No rash noted.   Results for orders placed in visit on 01/09/14  GLUCOSE, POCT (MANUAL RESULT ENTRY)      Result Value Ref Range   POC Glucose 175 (*) 70 - 99 mg/dl      Assessment & Plan:   Problem List Items Addressed This Visit   Cough - Primary     Possible mild COPD exacerbation in chronic smoker - but seems to be improving on its own. Treat with albuterol inhaler prn - discussed MDI use, discussed jittery/tachycardia side effect of med. Avoid prednisone 2/2 T1DM hx. Do not see need for abx currently. Discussed importance of quitting smoking. CXR today - no PNA on my read.    Relevant Orders      DG Chest 2 View   Diabetes type 1, uncontrolled     Deteriorated control with recent illness - discussed decreased lantus with decreased PO intake.    Nausea with vomiting     viral related, COPD exac related, or possible DM gastroparesis related. Will treat with phenergan shot today, and sent zofran to pharmacy for his use at home. Advised increased reglan to 10mg  bid. Anticipate mild dehydration today - discussed red flags to seek ER or urgent care. Pt agrees with plan, has f/u appt scheduled next week.    TOBACCO ABUSE     Continue to encourage cessation.  Actively cutting down.  Worried about possible COPD dx.     Other Visit Diagnoses   Diabetes        Relevant Orders       Glucose (CBG) (Completed)        Follow up plan: Return if symptoms worsen or fail to improve.

## 2014-01-23 ENCOUNTER — Telehealth: Payer: Self-pay | Admitting: Family Medicine

## 2014-01-23 ENCOUNTER — Encounter: Payer: Self-pay | Admitting: Family Medicine

## 2014-01-23 ENCOUNTER — Other Ambulatory Visit: Payer: Self-pay

## 2014-01-23 ENCOUNTER — Ambulatory Visit (INDEPENDENT_AMBULATORY_CARE_PROVIDER_SITE_OTHER): Payer: Commercial Indemnity | Admitting: Family Medicine

## 2014-01-23 VITALS — BP 140/90 | HR 100 | Temp 98.3°F | Ht 75.0 in | Wt 233.1 lb

## 2014-01-23 DIAGNOSIS — E041 Nontoxic single thyroid nodule: Secondary | ICD-10-CM

## 2014-01-23 DIAGNOSIS — Z0000 Encounter for general adult medical examination without abnormal findings: Secondary | ICD-10-CM

## 2014-01-23 DIAGNOSIS — F17200 Nicotine dependence, unspecified, uncomplicated: Secondary | ICD-10-CM

## 2014-01-23 DIAGNOSIS — Z1211 Encounter for screening for malignant neoplasm of colon: Secondary | ICD-10-CM

## 2014-01-23 DIAGNOSIS — E785 Hyperlipidemia, unspecified: Secondary | ICD-10-CM

## 2014-01-23 DIAGNOSIS — E1065 Type 1 diabetes mellitus with hyperglycemia: Secondary | ICD-10-CM

## 2014-01-23 DIAGNOSIS — IMO0002 Reserved for concepts with insufficient information to code with codable children: Secondary | ICD-10-CM

## 2014-01-23 DIAGNOSIS — I10 Essential (primary) hypertension: Secondary | ICD-10-CM

## 2014-01-23 DIAGNOSIS — Z0001 Encounter for general adult medical examination with abnormal findings: Secondary | ICD-10-CM | POA: Insufficient documentation

## 2014-01-23 LAB — LIPID PANEL
CHOLESTEROL: 194 mg/dL (ref 0–200)
HDL: 57.8 mg/dL (ref >39.00)
LDL CALC: 119 mg/dL — AB (ref 0–99)
TRIGLYCERIDES: 84 mg/dL (ref 0.0–149.0)
Total CHOL/HDL Ratio: 3
VLDL: 16.8 mg/dL (ref 0.0–40.0)

## 2014-01-23 LAB — BASIC METABOLIC PANEL
BUN: 17 mg/dL (ref 6–23)
CO2: 26 meq/L (ref 19–32)
Calcium: 8.8 mg/dL (ref 8.4–10.5)
Chloride: 101 mEq/L (ref 96–112)
Creatinine, Ser: 0.7 mg/dL (ref 0.4–1.5)
GFR: 128.32 mL/min (ref >60.00)
GLUCOSE: 93 mg/dL (ref 70–99)
POTASSIUM: 3.9 meq/L (ref 3.5–5.1)
Sodium: 135 mEq/L (ref 135–145)

## 2014-01-23 LAB — TSH: TSH: 0.6 u[IU]/mL (ref 0.35–5.50)

## 2014-01-23 MED ORDER — AMLODIPINE BESYLATE 10 MG PO TABS: ORAL_TABLET | ORAL | Status: DC

## 2014-01-23 MED ORDER — PRAVASTATIN SODIUM 40 MG PO TABS: ORAL_TABLET | ORAL | Status: DC

## 2014-01-23 NOTE — Telephone Encounter (Signed)
Relevant patient education mailed to patient.  

## 2014-01-23 NOTE — Telephone Encounter (Signed)
Pt request 30 day refill of amlodipine to [facility] [address] while waiting on mail order refill. Advised done.

## 2014-01-23 NOTE — Patient Instructions (Signed)
Stool kit today Blood work today  meds refilled today. Good to see you, return to see me as needed or in 6 months.

## 2014-01-23 NOTE — Progress Notes (Signed)
Pre visit review using our clinic review tool, if applicable. No additional management support is needed unless otherwise documented below in the visit note. 

## 2014-01-23 NOTE — Progress Notes (Signed)
BP 140/90  Pulse 100  Temp(Src) 98.3 F (36.8 C) (Oral)  Ht 6\' 3"  (1.905 m)  Wt 233 lb 1.9 oz (105.743 kg)  BMI 29.14 kg/m2   CC: CPE  Subjective:    Patient ID: Russell Gomez, male    DOB: 1961-05-03, 53 y.o.   MRN: [id number]  HPI: Russell Gomez is a 53 y.o. male presenting on 01/23/2014 for Annual Exam   Feeling better than prior visit.  Phenergan shot helped. H/o T1DM followed by endo Dr. Eddie Gomez at [facility], HTN, HLD, GERD and goiter. On reglan once daily. H/o thyroid nodule benign by biopsy 2013, states last [place] was 1 yr ago.  Does check BP at home - running 120/80. Sugars doing better.  Caffeine: 4-5 cups/night Lives with wife, youngest son (30), no pets Occupation: Works 3rd shift; Furniture conservator/restorer at [employer] Edu: 28yr Machinist degree Activity: golfing Diet: good water, fruits/vegetables daily  Sunscreen use discussed Seat belt use discussed  Preventative: Fasting today.  Had A1c checked recently. Colon cancer screening - discussed, would like ifob. Prostate cancer screening - discussed, would like to defer.  Strong stream, nocturia x1 Flu - has not had Pneumococcal - declines Tetanus - 2010  Relevant past medical, surgical, family and social history reviewed and updated as indicated.  Allergies and medications reviewed and updated. Current Outpatient Prescriptions on File Prior to Visit  Medication Sig  . albuterol (PROVENTIL HFA;VENTOLIN HFA) 108 (90 BASE) MCG/ACT inhaler Inhale 2 puffs into the lungs every 6 (six) hours as needed for wheezing or shortness of breath.  . enalapril (VASOTEC) 20 MG tablet TAKE TWO TABLETS BY MOUTH ONCE DAILY  . hydrochlorothiazide (MICROZIDE) 12.5 MG capsule Take 1 capsule (12.5 mg total) by mouth daily.  . insulin glargine (LANTUS) 100 UNIT/ML injection Inject 23 Units into the skin daily with breakfast.   . insulin regular (HUMULIN R,NOVOLIN R) 100 UNIT/ML injection Inject into the skin as directed. Sliding scale (5 units  with meals)  . meloxicam (MOBIC) 7.5 MG tablet Take 1 tablet (7.5 mg total) by mouth daily as needed for pain.  Marland Kitchen metoCLOPramide (REGLAN) 10 MG tablet TAKE ONE TABLET BY MOUTH TWICE DAILY AS NEEDED  . omeprazole (PRILOSEC) 40 MG capsule TAKE ONE CAPSULE BY MOUTH EVERY DAY -  MAY  TAKE  2  CAPSULES  . ondansetron (ZOFRAN) 4 MG tablet Take 1 tablet (4 mg total) by mouth every 8 (eight) hours as needed for nausea or vomiting.   No current facility-administered medications on file prior to visit.    Review of Systems  Constitutional: Negative for fever, chills, activity change, appetite change, fatigue and unexpected weight change.  HENT: Negative for hearing loss.   Eyes: Negative for visual disturbance.  Respiratory: Negative for cough, chest tightness, shortness of breath and wheezing.   Cardiovascular: Negative for chest pain, palpitations and leg swelling.  Gastrointestinal: Negative for nausea, vomiting, abdominal pain, diarrhea, constipation, blood in stool and abdominal distention.  Genitourinary: Negative for hematuria and difficulty urinating.  Musculoskeletal: Negative for arthralgias, myalgias and neck pain.  Skin: Negative for rash.  Neurological: Negative for dizziness, seizures, syncope and headaches.  Hematological: Negative for adenopathy. Does not bruise/bleed easily.  Psychiatric/Behavioral: Negative for dysphoric mood. The patient is not nervous/anxious.    Per HPI unless specifically indicated above    Objective:    BP 140/90  Pulse 100  Temp(Src) 98.3 F (36.8 C) (Oral)  Ht 6\' 3"  (1.905 m)  Wt 233 lb 1.9 oz (  105.743 kg)  BMI 29.14 kg/m2  Physical Exam  Nursing note and vitals reviewed. Constitutional: He is oriented to person, place, and time. He appears well-developed and well-nourished. No distress.  HENT:  Head: Normocephalic and atraumatic.  Right Ear: Hearing, tympanic membrane, external ear and ear canal normal.  Left Ear: Hearing, tympanic membrane,  external ear and ear canal normal.  Nose: Nose normal.  Mouth/Throat: Uvula is midline, oropharynx is clear and moist and mucous membranes are normal. No oropharyngeal exudate, posterior oropharyngeal edema or posterior oropharyngeal erythema.  Eyes: Conjunctivae and EOM are normal. Pupils are equal, round, and reactive to light. No scleral icterus.  Neck: Normal range of motion. Neck supple. No thyromegaly present.  Cardiovascular: Normal rate, regular rhythm, normal heart sounds and intact distal pulses.   No murmur heard. Pulses:      Radial pulses are 2+ on the right side, and 2+ on the left side.  Pulmonary/Chest: Effort normal and breath sounds normal. No respiratory distress. He has no wheezes. He has no rales.  Abdominal: Soft. Bowel sounds are normal. He exhibits no distension and no mass. There is no tenderness. There is no rebound and no guarding.  Musculoskeletal: Normal range of motion. He exhibits no edema.  Lymphadenopathy:    He has no cervical adenopathy.  Neurological: He is alert and oriented to person, place, and time.  CN grossly intact, station and gait intact  Skin: Skin is warm and dry. No rash noted.  Psychiatric: He has a normal mood and affect. His behavior is normal. Judgment and thought content normal.       Assessment & Plan:   Problem List Items Addressed This Visit   Colloid thyroid nodule     Last [place] 04/2012.  Check next visit. Check TSH today.    Diabetes type 1, uncontrolled     Followed by endo - with improving control noted.  ?dm gastroparesis with reglan use    Relevant Medications      pravastatin (PRAVACHOL) tablet   Other Relevant Orders      Basic metabolic panel   ESSENTIAL HYPERTENSION     Chronic, stable report from home readings although slightly elevated today. Check blood work today.    Relevant Medications      amLODIpine (NORVASC) tablet   Other Relevant Orders      TSH      Basic metabolic panel   Health maintenance  examination - Primary     Preventative protocols reviewed and updated unless pt declined. Discussed healthy diet and lifestyle.     HYPERLIPIDEMIA     Check FLP today.  Continue pravastatin 40.    Relevant Orders      Lipid panel   TOBACCO ABUSE     Continue to encourage cessation. Actively cutting down.  Down to 1 ppd now.     Other Visit Diagnoses   Special screening for malignant neoplasms, colon        Relevant Orders       Fecal occult blood, imunochemical        Follow up plan: Return in about 6 months (around 07/26/2014), or as needed, for follow up visit.

## 2014-01-23 NOTE — Assessment & Plan Note (Signed)
Continue to encourage cessation. Actively cutting down.  Down to 1 ppd now.

## 2014-01-23 NOTE — Assessment & Plan Note (Signed)
Followed by endo - with improving control noted.  ?dm gastroparesis with reglan use

## 2014-01-23 NOTE — Assessment & Plan Note (Signed)
Chronic, stable report from home readings although slightly elevated today. Check blood work today.

## 2014-01-23 NOTE — Assessment & Plan Note (Signed)
Check FLP today.  Continue pravastatin 40.

## 2014-01-23 NOTE — Assessment & Plan Note (Signed)
Last [place] 04/2012.  Check next visit. Check TSH today.

## 2014-01-23 NOTE — Assessment & Plan Note (Signed)
Preventative protocols reviewed and updated unless pt declined. Discussed healthy diet and lifestyle.  

## 2014-01-24 ENCOUNTER — Telehealth: Payer: Self-pay | Admitting: Family Medicine

## 2014-01-24 NOTE — Telephone Encounter (Signed)
Relevant patient education mailed to patient.  

## 2014-01-26 ENCOUNTER — Other Ambulatory Visit: Payer: Self-pay | Admitting: Family Medicine

## 2014-01-26 MED ORDER — ENALAPRIL MALEATE 20 MG PO TABS: ORAL_TABLET | ORAL | Status: DC

## 2014-01-26 MED ORDER — OMEPRAZOLE 40 MG PO CPDR: DELAYED_RELEASE_CAPSULE | ORAL | Status: DC

## 2014-01-26 MED ORDER — PRAVASTATIN SODIUM 80 MG PO TABS: ORAL_TABLET | ORAL | Status: DC

## 2014-01-26 MED ORDER — HYDROCHLOROTHIAZIDE 12.5 MG PO CAPS: 12.5000 mg | ORAL_CAPSULE | Freq: Every day | ORAL | Status: DC

## 2014-01-29 ENCOUNTER — Other Ambulatory Visit: Payer: Commercial Indemnity

## 2014-01-29 ENCOUNTER — Telehealth: Payer: Self-pay | Admitting: Radiology

## 2014-01-29 DIAGNOSIS — Z1211 Encounter for screening for malignant neoplasm of colon: Secondary | ICD-10-CM

## 2014-01-29 LAB — FECAL OCCULT BLOOD, IMMUNOCHEMICAL: Fecal Occult Bld: POSITIVE — AB

## 2014-01-29 NOTE — Telephone Encounter (Signed)
[facility] called a POSITIVE ifob result on this patient

## 2014-01-29 NOTE — Telephone Encounter (Signed)
Noted.  See result note.  

## 2014-02-01 ENCOUNTER — Other Ambulatory Visit: Payer: Self-pay | Admitting: Family Medicine

## 2014-02-01 DIAGNOSIS — R195 Other fecal abnormalities: Secondary | ICD-10-CM

## 2014-02-05 ENCOUNTER — Telehealth: Payer: Self-pay | Admitting: *Deleted

## 2014-02-05 NOTE — Telephone Encounter (Signed)
Received a faxed form from pharmacy wanting to clarify prescription for Pravastatin. Form is in your in box to be completed and faxed back.

## 2014-02-07 NOTE — Telephone Encounter (Signed)
filled out and will send back.

## 2014-02-12 ENCOUNTER — Other Ambulatory Visit: Payer: Self-pay | Admitting: Family Medicine

## 2014-02-12 NOTE — Telephone Encounter (Signed)
Received refill request electronically. Last refill 11/19/13, last office visit 01/23/14. Is it okay to refill medication?

## 2014-02-26 ENCOUNTER — Encounter (HOSPITAL_BASED_OUTPATIENT_CLINIC_OR_DEPARTMENT_OTHER): Payer: Self-pay | Admitting: *Deleted

## 2014-02-27 ENCOUNTER — Encounter (HOSPITAL_BASED_OUTPATIENT_CLINIC_OR_DEPARTMENT_OTHER): Payer: Self-pay | Admitting: *Deleted

## 2014-02-28 ENCOUNTER — Encounter (HOSPITAL_BASED_OUTPATIENT_CLINIC_OR_DEPARTMENT_OTHER)
Admission: RE | Admit: 2014-02-28 | Discharge: 2014-02-28 | Disposition: A | Discharge status: Home / Self Care | Payer: Commercial Indemnity | Source: Ambulatory Visit | Attending: Orthopedic Surgery | Admitting: Orthopedic Surgery

## 2014-02-28 ENCOUNTER — Other Ambulatory Visit: Payer: Self-pay

## 2014-02-28 ENCOUNTER — Other Ambulatory Visit: Payer: Self-pay | Admitting: Orthopedic Surgery

## 2014-02-28 DIAGNOSIS — Z01810 Encounter for preprocedural cardiovascular examination: Secondary | ICD-10-CM | POA: Insufficient documentation

## 2014-02-28 DIAGNOSIS — Z01812 Encounter for preprocedural laboratory examination: Secondary | ICD-10-CM | POA: Insufficient documentation

## 2014-02-28 LAB — BASIC METABOLIC PANEL
BUN: 21 mg/dL (ref 6–23)
CO2: 24 mEq/L (ref 19–32)
CREATININE: 0.66 mg/dL (ref 0.50–1.35)
Calcium: 9 mg/dL (ref 8.4–10.5)
Chloride: 98 mEq/L (ref 96–112)
GFR calc Af Amer: >90 mL/min (ref >90)
GFR calc non Af Amer: >90 mL/min (ref >90)
GLUCOSE: 254 mg/dL — AB (ref 70–99)
Potassium: 4.6 mEq/L (ref 3.7–5.3)
Sodium: 135 mEq/L — ABNORMAL LOW (ref 137–147)

## 2014-03-05 ENCOUNTER — Ambulatory Visit (HOSPITAL_BASED_OUTPATIENT_CLINIC_OR_DEPARTMENT_OTHER)
Admission: RE | Admit: 2014-03-05 | Discharge: 2014-03-05 | Disposition: A | Discharge status: Home / Self Care | Payer: Managed Care, Other (non HMO) | Source: Ambulatory Visit | Attending: Orthopedic Surgery | Admitting: Orthopedic Surgery

## 2014-03-05 ENCOUNTER — Encounter (HOSPITAL_BASED_OUTPATIENT_CLINIC_OR_DEPARTMENT_OTHER): Payer: Managed Care, Other (non HMO) | Admitting: Certified Registered"

## 2014-03-05 ENCOUNTER — Ambulatory Visit (HOSPITAL_BASED_OUTPATIENT_CLINIC_OR_DEPARTMENT_OTHER): Payer: Managed Care, Other (non HMO) | Admitting: Certified Registered"

## 2014-03-05 ENCOUNTER — Encounter (HOSPITAL_BASED_OUTPATIENT_CLINIC_OR_DEPARTMENT_OTHER): Payer: Self-pay | Admitting: Orthopedic Surgery

## 2014-03-05 ENCOUNTER — Encounter (HOSPITAL_BASED_OUTPATIENT_CLINIC_OR_DEPARTMENT_OTHER)
Admission: RE | Disposition: A | Discharge status: Home / Self Care | Payer: Self-pay | Source: Ambulatory Visit | Attending: Orthopedic Surgery

## 2014-03-05 DIAGNOSIS — I10 Essential (primary) hypertension: Secondary | ICD-10-CM | POA: Insufficient documentation

## 2014-03-05 DIAGNOSIS — Z885 Allergy status to narcotic agent status: Secondary | ICD-10-CM | POA: Insufficient documentation

## 2014-03-05 DIAGNOSIS — Z888 Allergy status to other drugs, medicaments and biological substances status: Secondary | ICD-10-CM | POA: Insufficient documentation

## 2014-03-05 DIAGNOSIS — E109 Type 1 diabetes mellitus without complications: Secondary | ICD-10-CM | POA: Insufficient documentation

## 2014-03-05 DIAGNOSIS — F17200 Nicotine dependence, unspecified, uncomplicated: Secondary | ICD-10-CM | POA: Insufficient documentation

## 2014-03-05 DIAGNOSIS — M6740 Ganglion, unspecified site: Secondary | ICD-10-CM | POA: Insufficient documentation

## 2014-03-05 DIAGNOSIS — Z794 Long term (current) use of insulin: Secondary | ICD-10-CM | POA: Insufficient documentation

## 2014-03-05 DIAGNOSIS — K219 Gastro-esophageal reflux disease without esophagitis: Secondary | ICD-10-CM | POA: Insufficient documentation

## 2014-03-05 DIAGNOSIS — M19049 Primary osteoarthritis, unspecified hand: Secondary | ICD-10-CM | POA: Insufficient documentation

## 2014-03-05 HISTORY — DX: Unspecified osteoarthritis, unspecified site: M19.90

## 2014-03-05 HISTORY — PX: MASS EXCISION: SHX2000

## 2014-03-05 HISTORY — PX: DISTAL INTERPHALANGEAL JOINT FUSION: SHX6428

## 2014-03-05 LAB — GLUCOSE, CAPILLARY
Glucose-Capillary: 226 mg/dL — ABNORMAL HIGH (ref 70–99)
Glucose-Capillary: 158 mg/dL — ABNORMAL HIGH (ref 70–99)

## 2014-03-05 LAB — POCT HEMOGLOBIN-HEMACUE: Hemoglobin: 14.3 g/dL (ref 13.0–17.0)

## 2014-03-05 SURGERY — EXCISION MASS
Anesthesia: General | Site: Finger | Laterality: Right

## 2014-03-05 MED ORDER — MIDAZOLAM HCL 2 MG/2ML IJ SOLN
INTRAMUSCULAR | Status: AC
  Filled 2014-03-05: qty 2

## 2014-03-05 MED ORDER — LACTATED RINGERS IV SOLN
INTRAVENOUS | Status: DC
  Administered 2014-03-05 (×2): via INTRAVENOUS

## 2014-03-05 MED ORDER — BUPIVACAINE HCL (PF) 0.25 % IJ SOLN
INTRAMUSCULAR | Status: AC
  Filled 2014-03-05: qty 30

## 2014-03-05 MED ORDER — CHLORHEXIDINE GLUCONATE 4 % EX LIQD: 60.0000 mL | Freq: Once | CUTANEOUS | Status: DC

## 2014-03-05 MED ORDER — INSULIN REGULAR HUMAN 100 UNIT/ML IJ SOLN
INTRAMUSCULAR | Status: AC
  Filled 2014-03-05: qty 1

## 2014-03-05 MED ORDER — FENTANYL CITRATE 0.05 MG/ML IJ SOLN
INTRAMUSCULAR | Status: AC
  Filled 2014-03-05: qty 2

## 2014-03-05 MED ORDER — PROPOFOL 10 MG/ML IV BOLUS
INTRAVENOUS | Status: DC | PRN
  Administered 2014-03-05: 200 mg via INTRAVENOUS

## 2014-03-05 MED ORDER — HYDROMORPHONE HCL PF 1 MG/ML IJ SOLN: 0.2500 mg | INTRAMUSCULAR | Status: DC | PRN

## 2014-03-05 MED ORDER — MIDAZOLAM HCL 2 MG/2ML IJ SOLN: 1.0000 mg | INTRAMUSCULAR | Status: DC | PRN

## 2014-03-05 MED ORDER — LIDOCAINE HCL (CARDIAC) 20 MG/ML IV SOLN
INTRAVENOUS | Status: DC | PRN
  Administered 2014-03-05: 60 mg via INTRAVENOUS

## 2014-03-05 MED ORDER — DEXAMETHASONE SODIUM PHOSPHATE 10 MG/ML IJ SOLN
INTRAMUSCULAR | Status: DC | PRN
  Administered 2014-03-05: 4 mg via INTRAVENOUS

## 2014-03-05 MED ORDER — ONDANSETRON HCL 4 MG/2ML IJ SOLN
INTRAMUSCULAR | Status: DC | PRN
  Administered 2014-03-05: 4 mg via INTRAVENOUS

## 2014-03-05 MED ORDER — BUPIVACAINE HCL (PF) 0.25 % IJ SOLN
INTRAMUSCULAR | Status: DC | PRN
  Administered 2014-03-05: 9 mL

## 2014-03-05 MED ORDER — LIDOCAINE HCL (PF) 1 % IJ SOLN
INTRAMUSCULAR | Status: AC
  Filled 2014-03-05: qty 5

## 2014-03-05 MED ORDER — PROMETHAZINE HCL 25 MG/ML IJ SOLN: 6.2500 mg | INTRAMUSCULAR | Status: DC | PRN

## 2014-03-05 MED ORDER — FENTANYL CITRATE 0.05 MG/ML IJ SOLN
INTRAMUSCULAR | Status: AC
  Filled 2014-03-05: qty 4

## 2014-03-05 MED ORDER — PROPOFOL 10 MG/ML IV EMUL
INTRAVENOUS | Status: AC
  Filled 2014-03-05: qty 100

## 2014-03-05 MED ORDER — FENTANYL CITRATE 0.05 MG/ML IJ SOLN
INTRAMUSCULAR | Status: DC | PRN
  Administered 2014-03-05 (×2): 25 ug via INTRAVENOUS
  Administered 2014-03-05: 50 ug via INTRAVENOUS

## 2014-03-05 MED ORDER — INSULIN REGULAR HUMAN 100 UNIT/ML IJ SOLN
4.0000 [IU] | Freq: Once | INTRAMUSCULAR | Status: AC
  Administered 2014-03-05: 4 [IU] via SUBCUTANEOUS

## 2014-03-05 MED ORDER — FENTANYL CITRATE 0.05 MG/ML IJ SOLN: 50.0000 ug | INTRAMUSCULAR | Status: DC | PRN

## 2014-03-05 MED ORDER — HYDROCODONE-ACETAMINOPHEN 5-325 MG PO TABS: 1.0000 | ORAL_TABLET | Freq: Four times a day (QID) | ORAL | Status: DC | PRN

## 2014-03-05 MED ORDER — OXYCODONE HCL 5 MG/5ML PO SOLN: 5.0000 mg | Freq: Once | ORAL | Status: DC | PRN

## 2014-03-05 MED ORDER — OXYCODONE HCL 5 MG PO TABS: 5.0000 mg | ORAL_TABLET | Freq: Once | ORAL | Status: DC | PRN

## 2014-03-05 MED ORDER — CEFAZOLIN SODIUM-DEXTROSE 2-3 GM-% IV SOLR: 2.0000 g | INTRAVENOUS | Status: DC

## 2014-03-05 MED ORDER — CEFAZOLIN SODIUM-DEXTROSE 2-3 GM-% IV SOLR
INTRAVENOUS | Status: AC
  Filled 2014-03-05: qty 50

## 2014-03-05 MED ORDER — MIDAZOLAM HCL 5 MG/5ML IJ SOLN
INTRAMUSCULAR | Status: DC | PRN
  Administered 2014-03-05: 2 mg via INTRAVENOUS

## 2014-03-05 MED ORDER — LIDOCAINE HCL (PF) 1 % IJ SOLN
INTRAMUSCULAR | Status: AC
  Filled 2014-03-05: qty 30

## 2014-03-05 MED ORDER — CEFAZOLIN SODIUM-DEXTROSE 2-3 GM-% IV SOLR
2.0000 g | INTRAVENOUS | Status: AC
  Administered 2014-03-05: 2 g via INTRAVENOUS

## 2014-03-05 SURGICAL SUPPLY — 50 items
BANDAGE COBAN STERILE 2 (GAUZE/BANDAGES/DRESSINGS) IMPLANT
BLADE 15 SAFETY STRL DISP (BLADE) ×2 IMPLANT
BLADE MINI RND TIP GREEN BEAV (BLADE) ×2 IMPLANT
BNDG COHESIVE 1X5 TAN STRL LF (GAUZE/BANDAGES/DRESSINGS) ×2 IMPLANT
BNDG COHESIVE 3X5 TAN STRL LF (GAUZE/BANDAGES/DRESSINGS) IMPLANT
BNDG ESMARK 4X9 LF (GAUZE/BANDAGES/DRESSINGS) ×2 IMPLANT
BNDG GAUZE ELAST 4 BULKY (GAUZE/BANDAGES/DRESSINGS) IMPLANT
CHLORAPREP W/TINT 26ML (MISCELLANEOUS) ×2 IMPLANT
CORDS BIPOLAR (ELECTRODE) ×2 IMPLANT
COVER MAYO STAND STRL (DRAPES) ×2 IMPLANT
COVER TABLE BACK 60X90 (DRAPES) ×2 IMPLANT
CUFF TOURNIQUET SINGLE 18IN (TOURNIQUET CUFF) ×2 IMPLANT
DECANTER SPIKE VIAL GLASS SM (MISCELLANEOUS) IMPLANT
DRAIN PENROSE 1/2X12 LTX STRL (WOUND CARE) ×2 IMPLANT
DRAPE EXTREMITY T 121X128X90 (DRAPE) ×2 IMPLANT
DRAPE SURG 17X23 STRL (DRAPES) ×2 IMPLANT
GAUZE SPONGE 4X4 12PLY STRL (GAUZE/BANDAGES/DRESSINGS) ×2 IMPLANT
GAUZE XEROFORM 1X8 LF (GAUZE/BANDAGES/DRESSINGS) ×2 IMPLANT
GLOVE BIOGEL PI IND STRL 7.0 (GLOVE) ×1 IMPLANT
GLOVE BIOGEL PI IND STRL 8.5 (GLOVE) ×1 IMPLANT
GLOVE BIOGEL PI INDICATOR 7.0 (GLOVE) ×1
GLOVE BIOGEL PI INDICATOR 8.5 (GLOVE) ×1
GLOVE ECLIPSE 7.0 STRL STRAW (GLOVE) ×2 IMPLANT
GLOVE SURG ORTHO 8.0 STRL STRW (GLOVE) ×2 IMPLANT
GOWN STRL REUS W/ TWL LRG LVL3 (GOWN DISPOSABLE) ×1 IMPLANT
GOWN STRL REUS W/TWL LRG LVL3 (GOWN DISPOSABLE) ×1
GOWN STRL REUS W/TWL XL LVL3 (GOWN DISPOSABLE) ×2 IMPLANT
NDL SAFETY ECLIPSE 18X1.5 (NEEDLE) IMPLANT
NEEDLE 27GAX1X1/2 (NEEDLE) ×2 IMPLANT
NEEDLE BLD DRAW 23GX1   MC LAB (NEEDLE) ×1
NEEDLE BLD DRAW 23GX1  MC LAB (NEEDLE) ×1 IMPLANT
NEEDLE HYPO 18GX1.5 SHARP (NEEDLE)
NS IRRIG 1000ML POUR BTL (IV SOLUTION) ×2 IMPLANT
PACK BASIN DAY SURGERY FS (CUSTOM PROCEDURE TRAY) ×2 IMPLANT
PAD CAST 3X4 CTTN HI CHSV (CAST SUPPLIES) IMPLANT
PADDING CAST ABS 3INX4YD NS (CAST SUPPLIES)
PADDING CAST ABS 4INX4YD NS (CAST SUPPLIES)
PADDING CAST ABS COTTON 3X4 (CAST SUPPLIES) IMPLANT
PADDING CAST ABS COTTON 4X4 ST (CAST SUPPLIES) IMPLANT
PADDING CAST COTTON 3X4 STRL (CAST SUPPLIES)
SPLINT PLASTER CAST XFAST 3X15 (CAST SUPPLIES) IMPLANT
SPLINT PLASTER XTRA FASTSET 3X (CAST SUPPLIES)
STOCKINETTE 4X48 STRL (DRAPES) ×2 IMPLANT
SUT VIC AB 4-0 P2 18 (SUTURE) IMPLANT
SUT VICRYL RAPID 5 0 P 3 (SUTURE) IMPLANT
SUT VICRYL RAPIDE 4/0 PS 2 (SUTURE) ×2 IMPLANT
SYR BULB 3OZ (MISCELLANEOUS) ×2 IMPLANT
SYR CONTROL 10ML LL (SYRINGE) ×2 IMPLANT
TOWEL OR 17X24 6PK STRL BLUE (TOWEL DISPOSABLE) ×2 IMPLANT
UNDERPAD 30X30 INCONTINENT (UNDERPADS AND DIAPERS) ×2 IMPLANT

## 2014-03-05 NOTE — Anesthesia Postprocedure Evaluation (Signed)
  Anesthesia Post-op Note  Patient: Russell Gomez  Procedure(s) Performed: Procedure(s) with comments: EXCISION CYST  (Right) - ANESTHESIA: IV REGINAL FAB DEBRIDEMENT (DIP) DISTAL INTERPHALANGEAL RIGHT MIDDLE FINGER (Right)  Patient Location: PACU  Anesthesia Type:General  Level of Consciousness: awake and alert   Airway and Oxygen Therapy: Patient Spontanous Breathing  Post-op Pain: mild  Post-op Assessment: Post-op Vital signs reviewed  Post-op Vital Signs: stable  Last Vitals:  Filed Vitals:   03/05/14 1013  BP: 116/68  Pulse: 76  Temp: 36.7 C  Resp: 16    Complications: No apparent anesthesia complications

## 2014-03-05 NOTE — Discharge Instructions (Addendum)

## 2014-03-05 NOTE — Transfer of Care (Signed)
Immediate Anesthesia Transfer of Care Note  Patient: Russell Gomez  Procedure(s) Performed: Procedure(s) with comments: EXCISION CYST  (Right) - ANESTHESIA: IV REGINAL FAB DEBRIDEMENT (DIP) DISTAL INTERPHALANGEAL RIGHT MIDDLE FINGER (Right)  Patient Location: PACU  Anesthesia Type:General  Level of Consciousness: awake, alert , oriented and patient cooperative  Airway & Oxygen Therapy: Patient Spontanous Breathing and Patient connected to face mask oxygen  Post-op Assessment: Report given to PACU RN and Post -op Vital signs reviewed and stable  Post vital signs: Reviewed and stable  Complications: No apparent anesthesia complications

## 2014-03-05 NOTE — Anesthesia Preprocedure Evaluation (Addendum)
Anesthesia Evaluation  Patient identified by MRN, date of birth, ID band Patient awake    Reviewed: Allergy & Precautions, NPO status   Airway Mallampati: I  Neck ROM: Full    Dental   Pulmonary Current Smoker,  breath sounds clear to auscultation        Cardiovascular hypertension, Pt. on medications Rhythm:Regular Rate:Normal     Neuro/Psych    GI/Hepatic GERD-  Medicated,  Endo/Other  diabetes, Type 2, Insulin Dependent  Renal/GU      Musculoskeletal   Abdominal   Peds  Hematology   Anesthesia Other Findings   Reproductive/Obstetrics                        Anesthesia Physical Anesthesia Plan  ASA: II  Anesthesia Plan: General   Post-op Pain Management:    Induction: Intravenous  Airway Management Planned: LMA  Additional Equipment:   Intra-op Plan:   Post-operative Plan: Extubation in OR  Informed Consent: I have reviewed the patients History and Physical, chart, labs and discussed the procedure including the risks, benefits and alternatives for the proposed anesthesia with the patient or authorized representative who has indicated his/her understanding and acceptance.     Plan Discussed with: CRNA and Surgeon  Anesthesia Plan Comments:        Anesthesia Quick Evaluation

## 2014-03-05 NOTE — Anesthesia Procedure Notes (Signed)
Procedure Name: LMA Insertion Date/Time: 03/05/2014 8:36 AM Performed by: Byanca Kasper Pre-anesthesia Checklist: Patient identified, Emergency Drugs available, Suction available and Patient being monitored Patient Re-evaluated:Patient Re-evaluated prior to inductionOxygen Delivery Method: Circle System Utilized Preoxygenation: Pre-oxygenation with 100% oxygen Intubation Type: IV induction Ventilation: Mask ventilation without difficulty LMA: LMA inserted LMA Size: 5.0 Number of attempts: 1 Airway Equipment and Method: bite block Placement Confirmation: positive ETCO2 Tube secured with: Tape Dental Injury: Teeth and Oropharynx as per pre-operative assessment

## 2014-03-05 NOTE — H&P (Signed)
Russell Gomez is a 53 year-old right-hand dominant male complaining of a mass on his right long finger for the past two to three months.  He recalls no history of injury.  This is on the dorsal aspect distal interphalangeal joint.  He is not complaining of any significant pain.  ALLERGIES:   None.  MEDICATIONS:  Novolin R, Lantus, three blood pressure medications and pravastatin.  SURGICAL HISTORY:   Cyst removed from his left hand and a fracture dislocation of his left middle finger treated by myself.   FAMILY MEDICAL HISTORY:  Positive for diabetes, high blood pressure, arthritis.   SOCIAL HISTORY:  He smokes a pack a day, does not drink.    REVIEW OF SYSTEMS:   Negative 14 points.   Russell Gomez is an 53 y.o. male.   Chief Complaint: mucoid tumorRMF HPI: see above  Past Medical History  Diagnosis Date  . Esophageal reflux   . Goiter 04/2012    no current med, has yearly monitoring  . High cholesterol   . HTN (hypertension)     under control with meds, has been on med x 2 yr.  . Diabetes type 1, uncontrolled     IDDM (Dr. Eddie Dibbles @ [facility])  . Dental crowns present   . Arthritis     on operative finger    Past Surgical History  Procedure Laterality Date  . Knee arthroscopy    . Skin graft split thickness leg / foot Right age 67    thigh  . Trigger finger release  11/28/2012    Procedure: RELEASE TRIGGER FINGER/A-1 PULLEY;  Surgeon: Wynonia Sours, MD;  Laterality: Left;  EXCISION MASS LEFT RING FINGER, RELEASE A-1 PULLEY LEFT RING FINGER (ganglion cyst)  . Open reduction nasal fracture  12/26/2008    with closure of nasal lac.  . Orif distal radius fracture Right 12/26/2008  . Percutaneous pinning Left 02/06/2013    Procedure: PINNING PIP OF THE LEFT MIDDLE FINGER ;  Surgeon: Wynonia Sours, MD;  Location: [facility];  Service: Orthopedics;  Laterality: Left;    Family History  Problem Relation Age of Onset  . Healthy Father   . Hypertension Mother   . Hyperlipidemia  Mother   . Diabetes Maternal Uncle   . Coronary artery disease Maternal Uncle   . Stroke Maternal Uncle   . Diabetes Paternal Grandmother   . Diabetes Paternal Uncle    Social History:  reports that he has been smoking Cigarettes.  He has a 20 pack-year smoking history. He has never used smokeless tobacco. He reports that he drinks alcohol. He reports that he does not use illicit drugs.  Allergies:  Allergies  Allergen Reactions  . Insulin Aspart Other (See Comments)    CELLULITIS  . Morphine Other (See Comments)    "MAKES ME MEAN"    No prescriptions prior to admission    No results found for this or any previous visit (from the past 48 hour(s)).  No results found.   Pertinent items are noted in HPI.  Height 6\' 2"  (1.88 m), weight 230 lb (104.327 kg).  General appearance: alert, cooperative and appears stated age Head: Normocephalic, without obvious abnormality Neck: no JVD Resp: clear to auscultation bilaterally Cardio: regular rate and rhythm, S1, S2 normal, no murmur, click, rub or gallop GI: soft, non-tender; bowel sounds normal; no masses,  no organomegaly Extremities: extremities normal, atraumatic, no cyanosis or edema Pulses: 2+ and symmetric Skin: Skin color, texture, turgor  normal. 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  He does have some complaints of possible flexor sheath cyst on his right ring finger.  I do not feel it at the present time.  He states it feels like the one he had on his left side.    We have discussed the possibility of excision of the mucoid tumor, debridement distal interphalangeal joint after discussion the etiology of this particular problem with him.  He would like to proceed.    He is scheduled for excision mucoid tumor with debridement distal interphalangeal joint  right middle finger.  He is aware of the possibility of stiffness, infection, recurrence, injury to arteries/nerves/tendons.  This is scheduled as an outpatient under regional anesthesia.  Wynonia Sours 03/05/2014, 5:25 AM

## 2014-03-05 NOTE — Op Note (Signed)
[id number] 

## 2014-03-05 NOTE — Brief Op Note (Signed)
03/05/2014  9:15 AM  PATIENT:  Russell Gomez  53 y.o. male  PRE-OPERATIVE DIAGNOSIS:  MUCOID TUMOR RIGHT MIDDLE FINGER DEGENERATIVE JOINT DISEASE  POST-OPERATIVE DIAGNOSIS:  MUCOID TUMOR RIGHT MIDDLE FINGER DEGENERATIVE JOINT DISEASE  PROCEDURE:  Procedure(s) with comments: EXCISION CYST  (Right) - ANESTHESIA: IV REGINAL FAB DEBRIDEMENT (DIP) DISTAL INTERPHALANGEAL RIGHT MIDDLE FINGER (Right)  SURGEON:  Surgeon(s) and Role:    * Wynonia Sours, MD - Primary  PHYSICIAN ASSISTANT:   ASSISTANTS: none   ANESTHESIA:   local and general  EBL:  Total I/O In: 1000 [I.V.:1000] Out: -   BLOOD ADMINISTERED:none  DRAINS: none   LOCAL MEDICATIONS USED:  BUPIVICAINE   SPECIMEN:  No Specimen  DISPOSITION OF SPECIMEN:  N/A  COUNTS:  YES  TOURNIQUET:   Total Tourniquet Time Documented: Forearm (Right) - 18 minutes Total: Forearm (Right) - 18 minutes   DICTATION: .Other Dictation: Dictation Number [id number]  PLAN OF CARE: Discharge to home after PACU  PATIENT DISPOSITION:  PACU - hemodynamically stable.

## 2014-03-06 ENCOUNTER — Encounter (HOSPITAL_BASED_OUTPATIENT_CLINIC_OR_DEPARTMENT_OTHER): Payer: Self-pay | Admitting: Orthopedic Surgery

## 2014-03-06 NOTE — Op Note (Signed)
NAMEEYDAN, CHIANESE NO.:  [id number]  MEDICAL RECORD NO.:  [id number]  LOCATION:                                 FACILITY:  PHYSICIAN:  Daryll Brod, M.D.            DATE OF BIRTH:  DATE OF PROCEDURE:  03/05/2014 DATE OF DISCHARGE:                              OPERATIVE REPORT   PREOPERATIVE DIAGNOSIS:  Mucoid cyst, degenerative arthritis, distal interphalangeal joint, right middle finger.  POSTOPERATIVE DIAGNOSIS:  Mucoid cyst, degenerative arthritis, distal interphalangeal joint, right middle finger.  PROCEDURE:  Excision of mucoid cyst, debridement, synovectomy DIP joint, right middle finger.  SURGEON:  Daryll Brod, M.D.  ANESTHESIA:  General along with metacarpal block.  ANESTHESIOLOGIST:  Ala Dach, M.D.  HISTORY:  The patient is a 53 year old male with a history of a large mucoid cyst on the distal interphalangeal joint, right middle finger. He is desirous having this excised along with debridement of the joint. He is aware that there is no guarantee with the surgery, possibility of infection; recurrence of injury to arteries, nerves, tendons, incomplete relief of symptoms, dystrophy, the fact possible stiffness to the DIP joint, possibility of future fusion.  In the preoperative area, the patient is seen, the extremity marked by both patient and surgeon. Antibiotic given.  PROCEDURE IN DETAIL:  The patient was brought to the operating room, where a general anesthetic was carried out without difficulty.  He was prepped using ChloraPrep, in supine position, right arm free.  A 3- minute dry time was allowed.  Time-out taken, confirming the patient and procedure.  The limb was exsanguinated with an Esmarch bandage. Tourniquet placed high on the arm, was inflated to 250 mmHg.  A curvilinear incision was made over the distal interphalangeal joint, carried down through subcutaneous tissue on the radial aspect midlateral line, carried down  through subcutaneous tissue.  Bleeders were electrocauterized with bipolar.  The cyst was immediately encountered, blunt and sharp dissection, this was dissected free.  The joint opened and debrided with a small rongeur including a synovectomy.  The wound was copiously irrigated with saline.  This specimen was sent to Pathology.  The area of translucent skin was excised.  The skin was then closed with interrupted 4-0 Vicryl Rapide sutures.  A metacarpal block was given 0.25% Marcaine without epinephrine, 9 mL was used.  Sterile compressive dressing and splint to the distal interphalangeal joint was applied.  On deflation of the tourniquet, immediately fingers pinked.  He was taken to the recovery room for observation in satisfactory condition.  He will be discharged home to return to the [facility] in 1 week on Vicodin.          ______________________________ Daryll Brod, M.D.     GK/MEDQ  D:  03/05/2014  T:  03/06/2014  Job:  [id number]

## 2014-03-18 ENCOUNTER — Encounter: Payer: Self-pay | Admitting: Gastroenterology

## 2014-04-02 ENCOUNTER — Other Ambulatory Visit: Payer: Self-pay | Admitting: *Deleted

## 2014-04-02 MED ORDER — ENALAPRIL MALEATE 20 MG PO TABS: 40.0000 mg | ORAL_TABLET | Freq: Every day | ORAL | Status: DC

## 2014-04-05 ENCOUNTER — Other Ambulatory Visit: Payer: Self-pay | Admitting: *Deleted

## 2014-04-05 MED ORDER — ENALAPRIL MALEATE 20 MG PO TABS: 40.0000 mg | ORAL_TABLET | Freq: Every day | ORAL | Status: DC

## 2014-04-10 ENCOUNTER — Emergency Department: Payer: Self-pay | Admitting: Emergency Medicine

## 2014-04-23 ENCOUNTER — Other Ambulatory Visit: Payer: Self-pay | Admitting: Family Medicine

## 2014-05-06 ENCOUNTER — Encounter: Payer: Commercial Indemnity | Admitting: Gastroenterology

## 2014-05-17 ENCOUNTER — Telehealth: Payer: Self-pay | Admitting: *Deleted

## 2014-05-17 NOTE — Telephone Encounter (Signed)
**  DIABETIC BUNDLE**  Left voicemail requesting pt to call and schedule a fasting lab appt. Pt needs lipid profile and A1c checked

## 2014-05-24 ENCOUNTER — Telehealth: Payer: Self-pay | Admitting: Family Medicine

## 2014-05-24 NOTE — Telephone Encounter (Signed)
Pt dropped off cpe form to be completed and signed by Dr. Danise Mina. Pt had cpe 01/23/14. I informed pt he might need to be seen to get an updated BP reading. If so, please call him at [phone]. Pt works 3rd shift so it is ok to leave message.

## 2014-05-24 NOTE — Telephone Encounter (Signed)
Form in your IN box for completion. Will require waist circumference.

## 2014-05-24 NOTE — Telephone Encounter (Signed)
Signed and placed in Kim's box. 

## 2014-05-28 NOTE — Telephone Encounter (Signed)
Spoke with patient and he measured waist for me. Form faxed.

## 2014-08-28 ENCOUNTER — Other Ambulatory Visit: Payer: Self-pay | Admitting: Family Medicine

## 2014-08-29 ENCOUNTER — Other Ambulatory Visit: Payer: Self-pay | Admitting: Family Medicine

## 2014-10-28 ENCOUNTER — Telehealth: Payer: Self-pay | Admitting: Family Medicine

## 2014-10-28 ENCOUNTER — Ambulatory Visit (INDEPENDENT_AMBULATORY_CARE_PROVIDER_SITE_OTHER): Payer: Commercial Indemnity | Admitting: Internal Medicine

## 2014-10-28 ENCOUNTER — Encounter: Payer: Self-pay | Admitting: Internal Medicine

## 2014-10-28 VITALS — BP 132/72 | HR 98 | Temp 98.2°F | Wt 240.0 lb

## 2014-10-28 DIAGNOSIS — I10 Essential (primary) hypertension: Secondary | ICD-10-CM

## 2014-10-28 LAB — COMPREHENSIVE METABOLIC PANEL
ALK PHOS: 71 U/L (ref 39–117)
ALT: 15 U/L (ref 0–53)
AST: 15 U/L (ref 0–37)
Albumin: 3.7 g/dL (ref 3.5–5.2)
BILIRUBIN TOTAL: 0.6 mg/dL (ref 0.2–1.2)
BUN: 16 mg/dL (ref 6–23)
CO2: 26 mEq/L (ref 19–32)
CREATININE: 0.7 mg/dL (ref 0.4–1.5)
Calcium: 8.3 mg/dL — ABNORMAL LOW (ref 8.4–10.5)
Chloride: 99 mEq/L (ref 96–112)
GFR: 134.67 mL/min (ref >60.00)
GLUCOSE: 324 mg/dL — AB (ref 70–99)
Potassium: 3.6 mEq/L (ref 3.5–5.1)
SODIUM: 134 meq/L — AB (ref 135–145)
Total Protein: 6.4 g/dL (ref 6.0–8.3)

## 2014-10-28 LAB — CBC
HEMATOCRIT: 44.4 % (ref 39.0–52.0)
HEMOGLOBIN: 14.6 g/dL (ref 13.0–17.0)
MCHC: 32.8 g/dL (ref 30.0–36.0)
MCV: 95.6 fl (ref 78.0–100.0)
Platelets: 336 10*3/uL (ref 150.0–400.0)
RBC: 4.65 Mil/uL (ref 4.22–5.81)
RDW: 14.4 % (ref 11.5–15.5)
WBC: 10 10*3/uL (ref 4.0–10.5)
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
## 2014-10-28 NOTE — Assessment & Plan Note (Signed)
BP at goal today He did not bring his cuff to check the reading with our manual reading Will check CBC and CMET today Will have him continue his amlodipine, enalapril and HCTZ and current dose for now Advised him to try to work on the weight and stop smoking, as this will help decrease his blood pressure  RTC in 1 month to recheck BP, bring your cuff with you

## 2014-10-28 NOTE — Patient Instructions (Signed)

## 2014-10-28 NOTE — Addendum Note (Signed)
Addended by: Marchia Bond on: 10/28/2014 03:18 PM   Modules accepted: Miquel Dunn

## 2014-10-28 NOTE — Telephone Encounter (Signed)
emmi emailed °

## 2014-10-30 ENCOUNTER — Telehealth: Payer: Self-pay | Admitting: Family Medicine

## 2014-10-30 NOTE — Telephone Encounter (Signed)
emmi emailed °

## 2014-11-11 ENCOUNTER — Telehealth: Payer: Self-pay | Admitting: Family Medicine

## 2014-11-11 NOTE — Telephone Encounter (Signed)
Patient notified as instructed by telephone. Was advised by patient that he has been taking all of his BP medications in the morning. Patient verbalized understanding and will make the adjustment on his medication. Patient stated that he will call back with an update and will schedule his follow-up appointment later.

## 2014-11-11 NOTE — Telephone Encounter (Signed)
Plz verify at what time he takes his bp meds - if all in the morning, would suggest changing enalapril (vasotec) to 20mg  bid and call us with update over next few days. Also due for f/u appointment, would want him to schedule in next few weeks. Continue working towards quitting smoking.

## 2014-11-11 NOTE — Addendum Note (Signed)
Addended by: Ria Bush on: 11/11/2014 09:01 AM   Modules accepted: Orders

## 2014-11-11 NOTE — Telephone Encounter (Signed)
Patient came to see [facility] on 10/28/14 with high blood pressure.  Patient was told to lose 15 pounds.  Patient said he took his blood pressure this morning and it was 170/98.  Patient said it has been running high.  Patient checked it before he took his bp medicine.  He said it will go down after taking medication, but it comes back up after 8pm.  Please advise.

## 2014-11-15 ENCOUNTER — Ambulatory Visit (INDEPENDENT_AMBULATORY_CARE_PROVIDER_SITE_OTHER): Payer: Commercial Indemnity | Admitting: Family Medicine

## 2014-11-15 ENCOUNTER — Encounter: Payer: Self-pay | Admitting: Family Medicine

## 2014-11-15 VITALS — BP 148/86 | HR 96 | Temp 98.0°F | Wt 241.5 lb

## 2014-11-15 DIAGNOSIS — Z720 Tobacco use: Secondary | ICD-10-CM

## 2014-11-15 DIAGNOSIS — F17200 Nicotine dependence, unspecified, uncomplicated: Secondary | ICD-10-CM

## 2014-11-15 DIAGNOSIS — I10 Essential (primary) hypertension: Secondary | ICD-10-CM

## 2014-11-15 MED ORDER — HYDROCHLOROTHIAZIDE 12.5 MG PO CAPS: 12.5000 mg | ORAL_CAPSULE | Freq: Two times a day (BID) | ORAL | Status: DC

## 2014-11-15 NOTE — Progress Notes (Signed)
BP 148/86 mmHg  Pulse 96  Temp(Src) 98 F (36.7 C) (Oral)  Wt 241 lb 8 oz (109.544 kg)   CC: f/u visit  Subjective:    Patient ID: Russell Gomez, male    DOB: 1961-07-30, 54 y.o.   MRN: [id number]  HPI: Russell Gomez is a 54 y.o. male presenting on 11/15/2014 for Follow-up   Seen here 10/28/2014 with increased bp readings however readings in office were normal. meds not changes, labwork stable. Brings log over last week - 134-179/80-90s. Friend recently suffered stroke from HTN. Pt has started walking 1 1/5 mile every day.   HTN - Compliant with current antihypertensive regimen of amlodipine 10mg  and enalapril 20mg  and hctz 12.5mg  in am.  Takes extra enalapril 20mg  at night time. Does check blood pressures at home: see above.  No low blood pressure readings or symptoms of dizziness/syncope.  Denies vision changes, CP/tightness, SOB, leg swelling.  Occasional headaches.   DM - followed by endo.   Smoking - 3/4 ppd.   Relevant past medical, surgical, family and social history reviewed and updated as indicated. Interim medical history since our last visit reviewed. Allergies and medications reviewed and updated. Current Outpatient Prescriptions on File Prior to Visit  Medication Sig  . albuterol (PROVENTIL HFA;VENTOLIN HFA) 108 (90 BASE) MCG/ACT inhaler Inhale 2 puffs into the lungs every 6 (six) hours as needed for wheezing or shortness of breath.  Marland Kitchen amLODipine (NORVASC) 10 MG tablet TAKE ONE TABLET BY MOUTH ONCE DAILY  . enalapril (VASOTEC) 20 MG tablet Take 20 mg by mouth 2 (two) times daily.   Marland Kitchen HYDROcodone-acetaminophen (NORCO) 5-325 MG per tablet Take 1 tablet by mouth every 6 (six) hours as needed for moderate pain.  Marland Kitchen insulin glargine (LANTUS) 100 UNIT/ML injection Inject 23 Units into the skin daily with breakfast.   . insulin regular (HUMULIN R,NOVOLIN R) 100 UNIT/ML injection Inject into the skin as directed. Sliding scale (5- 8 units with meals)  . metoCLOPramide (REGLAN)  10 MG tablet TAKE 1 TABLET BY MOUTH TWICE DAILY AS NEEDED  . omeprazole (PRILOSEC) 40 MG capsule TAKE 1 CAPSULE BY MOUTH EVERY DAY ( MAY TAKE 2 CAPSULES AS DIRECTED )  . ondansetron (ZOFRAN) 4 MG tablet Take 1 tablet (4 mg total) by mouth every 8 (eight) hours as needed for nausea or vomiting.  . pravastatin (PRAVACHOL) 80 MG tablet Take 2 40 mg tabs daily   No current facility-administered medications on file prior to visit.    Review of Systems Per HPI unless specifically indicated above     Objective:    BP 148/86 mmHg  Pulse 96  Temp(Src) 98 F (36.7 C) (Oral)  Wt 241 lb 8 oz (109.544 kg)  Wt Readings from Last 3 Encounters:  11/15/14 241 lb 8 oz (109.544 kg)  10/28/14 240 lb (108.863 kg)  03/05/14 226 lb (102.513 kg)    Physical Exam  Constitutional: He appears well-developed and well-nourished. No distress.  HENT:  Mouth/Throat: Oropharynx is clear and moist. No oropharyngeal exudate.  Neck: No thyromegaly present.  Cardiovascular: Normal rate, regular rhythm, normal heart sounds and intact distal pulses.   No murmur heard. Pulmonary/Chest: Effort normal and breath sounds normal. No respiratory distress. He has no wheezes. He has no rales.  Musculoskeletal: He exhibits no edema.  Nursing note reviewed.  Results for orders placed or performed in visit on 10/28/14  CBC  Result Value Ref Range   WBC 10.0 4.0 - 10.5 K/uL  RBC 4.65 4.22 - 5.81 Mil/uL   Platelets 336.0 150.0 - 400.0 K/uL   Hemoglobin 14.6 13.0 - 17.0 g/dL   HCT 44.4 39.0 - 52.0 %   MCV 95.6 78.0 - 100.0 fl   MCHC 32.8 30.0 - 36.0 g/dL   RDW 14.4 11.5 - 15.5 %  Comprehensive metabolic panel  Result Value Ref Range   Sodium 134 (L) 135 - 145 mEq/L   Potassium 3.6 3.5 - 5.1 mEq/L   Chloride 99 96 - 112 mEq/L   CO2 26 19 - 32 mEq/L   Glucose, Bld 324 (H) 70 - 99 mg/dL   BUN 16 6 - 23 mg/dL   Creatinine, Ser 0.7 0.4 - 1.5 mg/dL   Total Bilirubin 0.6 0.2 - 1.2 mg/dL   Alkaline Phosphatase 71 39 -  117 U/L   AST 15 0 - 37 U/L   ALT 15 0 - 53 U/L   Total Protein 6.4 6.0 - 8.3 g/dL   Albumin 3.7 3.5 - 5.2 g/dL   Calcium 8.3 (L) 8.4 - 10.5 mg/dL   GFR 134.67 >60.00 mL/min      Assessment & Plan:   Problem List Items Addressed This Visit    TOBACCO ABUSE    Continue to encourage cessation. Down to 3/4 ppd.    Essential hypertension - Primary    bp mildly elevated today despite changing ACEI to bid dosing. Will add hctz 12.5mg  in evenings. Pt agrees with plan. rtc 3 mo f/u Call us in 2 wks with bp update. Given DM hx, recommended he start ASA 81mg  daily.    Relevant Medications      aspirin (ASPIRIN EC) 81 MG EC tablet      hydrochlorothiazide (MICROZIDE) 12.5 MG capsule       Follow up plan: Return in about 3 months (around 02/14/2015), or as needed, for follow up visit.

## 2014-11-15 NOTE — Assessment & Plan Note (Signed)
Continue to encourage cessation. Down to 3/4 ppd.

## 2014-11-15 NOTE — Assessment & Plan Note (Addendum)
bp mildly elevated today despite changing ACEI to bid dosing. Will add hctz 12.5mg  in evenings. Pt agrees with plan. rtc 3 mo f/u Call us in 2 wks with bp update. Given DM hx, recommended he start ASA 81mg  daily.

## 2014-11-15 NOTE — Patient Instructions (Addendum)
Start aspirin enteric coated 81mg  once daily. Let's increase hydrochlorothiazide to 12.5mg  twice daily Continue other blood pressure medicines as up to now. Call me with an update in 2 weeks. Keep working towards quitting smoking.

## 2014-11-15 NOTE — Progress Notes (Signed)
Pre visit review using our clinic review tool, if applicable. No additional management support is needed unless otherwise documented below in the visit note. 

## 2014-12-23 ENCOUNTER — Other Ambulatory Visit: Payer: Self-pay | Admitting: Family Medicine

## 2015-01-21 LAB — HM DIABETES FOOT EXAM

## 2015-02-14 ENCOUNTER — Ambulatory Visit: Payer: Commercial Indemnity | Admitting: Family Medicine

## 2015-02-14 DIAGNOSIS — Z0289 Encounter for other administrative examinations: Secondary | ICD-10-CM

## 2015-02-25 ENCOUNTER — Encounter: Payer: Self-pay | Admitting: Family Medicine

## 2015-02-25 ENCOUNTER — Ambulatory Visit (INDEPENDENT_AMBULATORY_CARE_PROVIDER_SITE_OTHER): Payer: Commercial Indemnity | Admitting: Family Medicine

## 2015-02-25 VITALS — BP 132/74 | HR 96 | Temp 98.4°F | Wt 240.5 lb

## 2015-02-25 DIAGNOSIS — I10 Essential (primary) hypertension: Secondary | ICD-10-CM | POA: Diagnosis not present

## 2015-02-25 DIAGNOSIS — Z720 Tobacco use: Secondary | ICD-10-CM | POA: Diagnosis not present

## 2015-02-25 DIAGNOSIS — Z23 Encounter for immunization: Secondary | ICD-10-CM | POA: Diagnosis not present

## 2015-02-25 DIAGNOSIS — F17200 Nicotine dependence, unspecified, uncomplicated: Secondary | ICD-10-CM

## 2015-02-25 DIAGNOSIS — E109 Type 1 diabetes mellitus without complications: Secondary | ICD-10-CM

## 2015-02-25 LAB — HEMOGLOBIN A1C: A1c: 7

## 2015-02-25 MED ORDER — HYDROCHLOROTHIAZIDE 12.5 MG PO CAPS: 12.5000 mg | ORAL_CAPSULE | Freq: Two times a day (BID) | ORAL | Status: DC

## 2015-02-25 NOTE — Patient Instructions (Addendum)
You are doing well.  Return in 6 months for annual exam, sooner if needed.  Pneumovax today

## 2015-02-25 NOTE — Progress Notes (Signed)
Pre visit review using our clinic review tool, if applicable. No additional management support is needed unless otherwise documented below in the visit note. 

## 2015-02-25 NOTE — Addendum Note (Signed)
Addended by: Royann Shivers A on: 02/25/2015 09:25 AM   Modules accepted: Orders

## 2015-02-25 NOTE — Assessment & Plan Note (Signed)
Continue to encourage smoking cessation. Declines pharmacotherapy or further assistance today.

## 2015-02-25 NOTE — Assessment & Plan Note (Signed)
Chronic, stable. Improved control with addition of 2nd HCTZ. Continue current regimen.

## 2015-02-25 NOTE — Assessment & Plan Note (Signed)
Followed by endo. Latest A1c great at 7%. Pt very happy with results.

## 2015-02-25 NOTE — Progress Notes (Signed)
BP 132/74 mmHg  Pulse 96  Temp(Src) 98.4 F (36.9 C) (Oral)  Wt 240 lb 8 oz (109.09 kg)   CC: 3 mo f/u visit  Subjective:    Patient ID: Russell Gomez, male    DOB: 1961/03/21, 54 y.o.   MRN: [id number]  HPI: Russell Gomez is a 54 y.o. male presenting on 02/25/2015 for Follow-up   HTN - Compliant with current antihypertensive regimen of enalapril 20mg  bid, hctz 12.5mg  bid.  Does check blood pressures at home: 130/80s. No low blood pressure readings or symptoms of dizziness/syncope.  Denies HA, vision changes, CP/tightness, SOB, leg swelling. BP better controlled with addition of hctz 12.5mg  nightly.   DM - A1c recently checked at endo, 7.0%. Concerned with developing neuropathy but declines gabapentin.  Foot exam done by endo last month.  Continues walking but having some neuropathic pain which limits walking. Describes sharp pain + burning pain R lateral foot. Foot exam by endo.   Smoker - <1 ppd  Relevant past medical, surgical, family and social history reviewed and updated as indicated. Interim medical history since our last visit reviewed. Allergies and medications reviewed and updated. Current Outpatient Prescriptions on File Prior to Visit  Medication Sig  . amLODipine (NORVASC) 10 MG tablet TAKE 1 TABLET BY MOUTH ONE TIME DAILY  . aspirin (ASPIRIN EC) 81 MG EC tablet Take 81 mg by mouth daily. Swallow whole.  . enalapril (VASOTEC) 20 MG tablet Take 20 mg by mouth 2 (two) times daily.   . insulin glargine (LANTUS) 100 UNIT/ML injection Inject 23 Units into the skin daily with breakfast.   . insulin regular (HUMULIN R,NOVOLIN R) 100 UNIT/ML injection Inject into the skin as directed. Sliding scale (5- 8 units with meals)  . metoCLOPramide (REGLAN) 10 MG tablet TAKE 1 TABLET BY MOUTH TWICE DAILY AS NEEDED  . omeprazole (PRILOSEC) 40 MG capsule TAKE 1 CAPSULE BY MOUTH EVERY DAY (MAY TAKE 2 CAPSULES AS DIRECTED)  . pravastatin (PRAVACHOL) 80 MG tablet Take 2 40 mg tabs daily   . albuterol (PROVENTIL HFA;VENTOLIN HFA) 108 (90 BASE) MCG/ACT inhaler Inhale 2 puffs into the lungs every 6 (six) hours as needed for wheezing or shortness of breath. (Patient not taking: Reported on 02/25/2015)   No current facility-administered medications on file prior to visit.    Review of Systems Per HPI unless specifically indicated above     Objective:    BP 132/74 mmHg  Pulse 96  Temp(Src) 98.4 F (36.9 C) (Oral)  Wt 240 lb 8 oz (109.09 kg)  Wt Readings from Last 3 Encounters:  02/25/15 240 lb 8 oz (109.09 kg)  11/15/14 241 lb 8 oz (109.544 kg)  10/28/14 240 lb (108.863 kg)    Physical Exam  Constitutional: He appears well-developed and well-nourished. No distress.  HENT:  Head: Normocephalic and atraumatic.  Right Ear: External ear normal.  Left Ear: External ear normal.  Nose: Nose normal.  Mouth/Throat: Oropharynx is clear and moist. No oropharyngeal exudate.  Eyes: Conjunctivae and EOM are normal. Pupils are equal, round, and reactive to light. No scleral icterus.  Neck: Normal range of motion. Neck supple.  Cardiovascular: Normal rate, regular rhythm, normal heart sounds and intact distal pulses.   No murmur heard. Pulmonary/Chest: Effort normal and breath sounds normal. No respiratory distress. He has no wheezes. He has no rales.  Musculoskeletal: He exhibits no edema.  See HPI for foot exam if done  Lymphadenopathy:    He has no cervical adenopathy.  Skin: Skin is warm and dry. No rash noted.  Psychiatric: He has a normal mood and affect.  Nursing note and vitals reviewed.  Lab Results  Component Value Date   HGBA1C 8.1* 07/11/2012      Assessment & Plan:   Problem List Items Addressed This Visit    TOBACCO ABUSE    Continue to encourage smoking cessation. Declines pharmacotherapy or further assistance today.      Essential hypertension - Primary    Chronic, stable. Improved control with addition of 2nd HCTZ. Continue current regimen.       Relevant Medications   hydrochlorothiazide (MICROZIDE) 12.5 MG capsule   Diabetes mellitus type 1, controlled    Followed by endo. Latest A1c great at 7%. Pt very happy with results.          Follow up plan: Return in about 6 months (around 08/27/2015), or as needed, for annual exam, prior fasting for blood work.

## 2015-02-27 ENCOUNTER — Telehealth: Payer: Self-pay | Admitting: Family Medicine

## 2015-04-20 ENCOUNTER — Other Ambulatory Visit: Payer: Self-pay | Admitting: Family Medicine

## 2015-04-28 ENCOUNTER — Other Ambulatory Visit: Payer: Self-pay | Admitting: Family Medicine

## 2015-05-24 ENCOUNTER — Other Ambulatory Visit: Payer: Self-pay | Admitting: Family Medicine

## 2015-06-16 ENCOUNTER — Other Ambulatory Visit: Payer: Self-pay | Admitting: Family Medicine

## 2015-07-30 ENCOUNTER — Other Ambulatory Visit: Payer: Self-pay | Admitting: Family Medicine

## 2015-08-18 ENCOUNTER — Other Ambulatory Visit: Payer: Self-pay | Admitting: Family Medicine

## 2015-08-18 DIAGNOSIS — E785 Hyperlipidemia, unspecified: Secondary | ICD-10-CM

## 2015-08-18 DIAGNOSIS — I10 Essential (primary) hypertension: Secondary | ICD-10-CM

## 2015-08-18 DIAGNOSIS — E109 Type 1 diabetes mellitus without complications: Secondary | ICD-10-CM

## 2015-08-18 DIAGNOSIS — Z125 Encounter for screening for malignant neoplasm of prostate: Secondary | ICD-10-CM

## 2015-08-20 ENCOUNTER — Other Ambulatory Visit (INDEPENDENT_AMBULATORY_CARE_PROVIDER_SITE_OTHER): Payer: Commercial Indemnity

## 2015-08-20 DIAGNOSIS — E785 Hyperlipidemia, unspecified: Secondary | ICD-10-CM | POA: Diagnosis not present

## 2015-08-20 DIAGNOSIS — I10 Essential (primary) hypertension: Secondary | ICD-10-CM

## 2015-08-20 DIAGNOSIS — Z125 Encounter for screening for malignant neoplasm of prostate: Secondary | ICD-10-CM | POA: Diagnosis not present

## 2015-08-20 DIAGNOSIS — E109 Type 1 diabetes mellitus without complications: Secondary | ICD-10-CM

## 2015-08-20 LAB — BASIC METABOLIC PANEL
BUN: 17 mg/dL (ref 6–23)
CHLORIDE: 103 meq/L (ref 96–112)
CO2: 25 meq/L (ref 19–32)
Calcium: 8.7 mg/dL (ref 8.4–10.5)
Creatinine, Ser: 0.68 mg/dL (ref 0.40–1.50)
GFR: 129.7 mL/min (ref >60.00)
Glucose, Bld: 58 mg/dL — ABNORMAL LOW (ref 70–99)
POTASSIUM: 3.3 meq/L — AB (ref 3.5–5.1)
SODIUM: 139 meq/L (ref 135–145)

## 2015-08-20 LAB — LIPID PANEL
CHOLESTEROL: 190 mg/dL (ref 0–200)
HDL: 83.3 mg/dL (ref >39.00)
LDL CALC: 83 mg/dL (ref 0–99)
NonHDL: 107.01
TRIGLYCERIDES: 122 mg/dL (ref 0.0–149.0)
Total CHOL/HDL Ratio: 2
VLDL: 24.4 mg/dL (ref 0.0–40.0)

## 2015-08-20 LAB — HEMOGLOBIN A1C: HEMOGLOBIN A1C: 6.6 % — AB (ref 4.6–6.5)

## 2015-08-20 LAB — PSA: PSA: 0.15 ng/mL (ref 0.10–4.00)

## 2015-08-27 ENCOUNTER — Encounter: Payer: Self-pay | Admitting: Family Medicine

## 2015-08-27 ENCOUNTER — Ambulatory Visit (INDEPENDENT_AMBULATORY_CARE_PROVIDER_SITE_OTHER): Payer: Commercial Indemnity | Admitting: Family Medicine

## 2015-08-27 VITALS — BP 136/76 | HR 96 | Temp 98.1°F | Ht 75.0 in | Wt 238.0 lb

## 2015-08-27 DIAGNOSIS — E876 Hypokalemia: Secondary | ICD-10-CM | POA: Insufficient documentation

## 2015-08-27 DIAGNOSIS — E785 Hyperlipidemia, unspecified: Secondary | ICD-10-CM

## 2015-08-27 DIAGNOSIS — I10 Essential (primary) hypertension: Secondary | ICD-10-CM

## 2015-08-27 DIAGNOSIS — Z1211 Encounter for screening for malignant neoplasm of colon: Secondary | ICD-10-CM

## 2015-08-27 DIAGNOSIS — Z0000 Encounter for general adult medical examination without abnormal findings: Secondary | ICD-10-CM

## 2015-08-27 DIAGNOSIS — F17200 Nicotine dependence, unspecified, uncomplicated: Secondary | ICD-10-CM

## 2015-08-27 DIAGNOSIS — E109 Type 1 diabetes mellitus without complications: Secondary | ICD-10-CM

## 2015-08-27 DIAGNOSIS — K219 Gastro-esophageal reflux disease without esophagitis: Secondary | ICD-10-CM

## 2015-08-27 LAB — POTASSIUM: POTASSIUM: 3.3 meq/L — AB (ref 3.5–5.1)

## 2015-08-27 NOTE — Assessment & Plan Note (Signed)
Chronic, stable. Continue current regimen. 

## 2015-08-27 NOTE — Assessment & Plan Note (Signed)
Preventative protocols reviewed and updated unless pt declined. Discussed healthy diet and lifestyle.  

## 2015-08-27 NOTE — Assessment & Plan Note (Signed)
Chronic, stable. Continue 80mg  pravastatin.

## 2015-08-27 NOTE — Progress Notes (Signed)
Pre visit review using our clinic review tool, if applicable. No additional management support is needed unless otherwise documented below in the visit note. 

## 2015-08-27 NOTE — Assessment & Plan Note (Signed)
Chronic, stable. Followed by endo Dr Eddie Dibbles. States new goal 7.5%.

## 2015-08-27 NOTE — Assessment & Plan Note (Signed)
Stable on daily PPI. If stops, sxs develop.

## 2015-08-27 NOTE — Assessment & Plan Note (Signed)
Continue to encourage smoking cessation. Discussed concern for development of COPD. Pt denies dyspnea or cough. Return for spirometry in 4-6 mo.

## 2015-08-27 NOTE — Progress Notes (Signed)
BP 136/76 mmHg  Pulse 96  Temp(Src) 98.1 F (36.7 C) (Oral)  Ht 6\' 3"  (1.905 m)  Wt 238 lb (107.956 kg)  BMI 29.75 kg/m2   CC: CPE  Subjective:    Patient ID: Russell Gomez, male    DOB: [birth year], 54 y.o.   MRN: [id number]  HPI: Russell Gomez is a 54 y.o. male presenting on 08/27/2015 for Annual Exam   T1DM - followed by endo Dr Eddie Dibbles. Recent insulin changes = less hypoglylcemic episodes. Sees Q6 months.  Smoking 1 ppd. Pt denies dyspnea or wheezing. No cough other than current URI he has.  Preventative: Colon cancer screening - pos iFOB 01/2014. Never had colonoscopy. Requests this schedule beginning of next year 2017. Ordered today. Prostate cancer screening - discussed, would like to defer. 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 Interim medical history since our last visit reviewed. Allergies and medications reviewed and updated. Current Outpatient Prescriptions on File Prior to Visit  Medication Sig  . amLODipine (NORVASC) 10 MG tablet TAKE 1 TABLET BY MOUTH ONE TIME DAILY  . aspirin (ASPIRIN EC) 81 MG EC tablet Take 81 mg by mouth daily. Swallow whole.  . enalapril (VASOTEC) 20 MG tablet TAKE 1 TABLET BY MOUTH TWICE DAILY  . hydrochlorothiazide (MICROZIDE) 12.5 MG capsule Take 1 capsule (12.5 mg total) by mouth 2 (two) times daily.  . insulin glargine (LANTUS) 100 UNIT/ML injection Inject 18 Units into the skin daily with breakfast.   . insulin regular (HUMULIN R,NOVOLIN R) 100 UNIT/ML injection Inject into the skin as directed.  Sliding scale (5- 8 units with meals)  . omeprazole (PRILOSEC) 40 MG capsule TAKE 1 CAPSULE BY MOUTH EVERY DAY (MAY TAKE 2 CAPSULES AS DIRECTED)  . pravastatin (PRAVACHOL) 80 MG tablet TAKE 1 TABLET BY MOUTH ONE TIME DAILY  . metoCLOPramide (REGLAN) 10 MG tablet TAKE 1 TABLET BY MOUTH TWICE DAILY AS NEEDED (Patient not taking: Reported on 08/27/2015)   No current facility-administered medications on file prior to visit.    Review of Systems  Constitutional: Negative for fever, chills, activity change, appetite change, fatigue and unexpected weight change.  HENT: Positive for congestion (recent cold). Negative for hearing loss.   Eyes: Negative for visual disturbance.  Respiratory: Positive for cough (recent cold) and wheezing. Negative for chest tightness and shortness of breath.   Cardiovascular: Positive for leg swelling (ankles, dependent). Negative for chest pain and palpitations.  Gastrointestinal: Positive for nausea (occasional am). Negative for vomiting, abdominal pain, diarrhea, constipation, blood in stool and abdominal distention.  Genitourinary: Negative for hematuria and difficulty urinating.  Musculoskeletal: Negative for myalgias, arthralgias and neck pain.  Skin: Negative for rash.  Neurological: Negative for dizziness, seizures, syncope and headaches.  Hematological: Negative for adenopathy. Does not bruise/bleed easily.  Psychiatric/Behavioral: Negative for dysphoric mood. The patient is not nervous/anxious.    Per HPI unless specifically indicated above     Objective:    BP 136/76 mmHg  Pulse 96  Temp(Src) 98.1 F (36.7 C) (Oral)  Ht 6\' 3"  (1.905 m)  Wt 238 lb (107.956 kg)  BMI 29.75 kg/m2  Wt  Readings from Last 3 Encounters:  08/27/15 238 lb (107.956 kg)  02/25/15 240 lb 8 oz (109.09 kg)  11/15/14 241 lb 8 oz (109.544 kg)    Physical Exam  Constitutional: He is oriented to person, place, and time. He appears well-developed and well-nourished. No distress.    HENT:  Head: Normocephalic and atraumatic.  Right Ear: Hearing, tympanic membrane, external ear and ear canal normal.  Left Ear: Hearing, tympanic membrane, external ear and ear canal normal.  Nose: Nose normal.  Mouth/Throat: Uvula is midline, oropharynx is clear and moist and mucous membranes are normal. No oropharyngeal exudate, posterior oropharyngeal edema or posterior oropharyngeal erythema.  Eyes: Conjunctivae and EOM are normal. Pupils are equal, round, and reactive to light. No scleral icterus.  Neck: Normal range of motion. Neck supple. Carotid bruit is not present. No thyromegaly present.  Cardiovascular: Normal rate, regular rhythm, normal heart sounds and intact distal pulses.   No murmur heard. Pulses:      Radial pulses are 2+ on the right side, and 2+ on the left side.  Pulmonary/Chest: Effort normal. No respiratory distress. He has decreased breath sounds. He has no wheezes. He has no rhonchi. He has no rales.  Coarse throughout  Abdominal: Soft. Bowel sounds are normal. He exhibits no distension and no mass. There is no tenderness. There is no rebound and no guarding.  Musculoskeletal: Normal range of motion. He exhibits no edema.  Lymphadenopathy:    He has no cervical adenopathy.  Neurological: He is alert and oriented to person, place, and time.  CN grossly intact, station and gait intact  Skin: Skin is warm and dry. No rash noted.  Psychiatric: He has a normal mood and affect. His behavior is normal. Judgment and thought content normal.  Nursing note and vitals reviewed.  Results for orders placed or performed in visit on 82/70/78  Basic metabolic panel  Result Value Ref Range   Sodium 139 135 - 145 mEq/L   Potassium 3.3 (L) 3.5 - 5.1 mEq/L   Chloride 103 96 - 112 mEq/L   CO2 25 19 - 32 mEq/L   Glucose, Bld 58 (L) 70 - 99 mg/dL   BUN 17 6 - 23 mg/dL   Creatinine, Ser 0.68 0.40 - 1.50 mg/dL   Calcium 8.7 8.4 - 10.5 mg/dL   GFR 129.70 >60.00 mL/min   Hemoglobin A1c  Result Value Ref Range   Hgb A1c MFr Bld 6.6 (H) 4.6 - 6.5 %  Lipid panel  Result Value Ref Range   Cholesterol 190 0 - 200 mg/dL   Triglycerides 122.0 0.0 - 149.0 mg/dL   HDL 83.30 >39.00 mg/dL   VLDL 24.4 0.0 - 40.0 mg/dL   LDL Cholesterol 83 0 - 99 mg/dL   Total CHOL/HDL Ratio 2    NonHDL 107.01   PSA  Result Value Ref Range   PSA 0.15 0.10 - 4.00 ng/mL      Assessment & Plan:   Problem List Items Addressed This Visit    TOBACCO ABUSE    Continue to encourage smoking cessation. Discussed concern for development of COPD. Pt denies dyspnea or cough. Return for spirometry in 4-6 mo.      Hypokalemia    Recheck today - if staying low will start 60mEq Kdur daily (as on hctz)      Relevant Orders   Potassium   HLD (hyperlipidemia)    Chronic, stable. Continue 80mg  pravastatin.      Health maintenance examination - Primary  Preventative protocols reviewed and updated unless pt declined. Discussed healthy diet and lifestyle.       GERD    Stable on daily PPI. If stops, sxs develop.      Essential hypertension    Chronic, stable. Continue current regimen.      Diabetes mellitus type 1, controlled (HCC)    Chronic, stable. Followed by endo Dr Eddie Dibbles. States new goal 7.5%.       Other Visit Diagnoses    Special screening for malignant neoplasms, colon        Relevant Orders    Ambulatory referral to Gastroenterology        Follow up plan: Return in about 6 months (around 02/25/2016), or as needed, for follow up visit.

## 2015-08-27 NOTE — Patient Instructions (Addendum)
Pass by Marion's office to schedule colonoscopy for beginning of the year. Labs looked good. Good job with sugars - work with endo on new A1c goal. Recheck potassium today. Return in 4-6 months for spirometry to evaluate for COPD. Work on quitting smoking.

## 2015-08-27 NOTE — Assessment & Plan Note (Signed)
Recheck today - if staying low will start 17mEq Kdur daily (as on hctz)

## 2015-08-30 ENCOUNTER — Other Ambulatory Visit: Payer: Self-pay | Admitting: Family Medicine

## 2015-08-30 MED ORDER — POTASSIUM CHLORIDE CRYS ER 10 MEQ PO TBCR: 10.0000 meq | EXTENDED_RELEASE_TABLET | Freq: Every day | ORAL | Status: DC

## 2015-09-01 ENCOUNTER — Encounter: Payer: Self-pay | Admitting: *Deleted

## 2015-10-14 ENCOUNTER — Other Ambulatory Visit: Payer: Self-pay | Admitting: Family Medicine

## 2015-10-27 LAB — HM DIABETES EYE EXAM

## 2015-11-04 ENCOUNTER — Other Ambulatory Visit: Payer: Self-pay | Admitting: Family Medicine

## 2015-11-18 ENCOUNTER — Encounter: Payer: Self-pay | Admitting: Family Medicine

## 2015-12-02 ENCOUNTER — Other Ambulatory Visit: Payer: Self-pay | Admitting: Family Medicine

## 2016-01-14 ENCOUNTER — Other Ambulatory Visit: Payer: Self-pay | Admitting: Family Medicine

## 2016-01-26 ENCOUNTER — Ambulatory Visit (INDEPENDENT_AMBULATORY_CARE_PROVIDER_SITE_OTHER): Payer: Commercial Indemnity | Admitting: Primary Care

## 2016-01-26 ENCOUNTER — Encounter: Payer: Self-pay | Admitting: Primary Care

## 2016-01-26 VITALS — BP 136/74 | HR 107 | Temp 97.6°F | Ht 75.0 in | Wt 240.8 lb

## 2016-01-26 DIAGNOSIS — R112 Nausea with vomiting, unspecified: Secondary | ICD-10-CM

## 2016-01-26 DIAGNOSIS — R21 Rash and other nonspecific skin eruption: Secondary | ICD-10-CM

## 2016-01-26 MED ORDER — PREDNISONE 10 MG PO TABS: ORAL_TABLET | ORAL | Status: DC

## 2016-01-26 MED ORDER — ONDANSETRON 4 MG PO TBDP: 4.0000 mg | ORAL_TABLET | Freq: Three times a day (TID) | ORAL | Status: DC | PRN

## 2016-01-26 NOTE — Patient Instructions (Signed)
Start Prednisone tablets for rash. Take 3 tablets for 2 days, then 2 tablets for 2 days, then 1 tablet for 2 days. Keep a close eye on your blood sugars as this may cause an increase in numbers.  Start Zofran (ondansetron) tablets for nausea. Melt 1 tablet every 8 hours as needed for nausea.  Advance your diet slowly as it's important for you to drink water to prevent dehydration.  Please notify me if no improvement in nausea or if you start to vomit persistently. Please notify me if no improvement in your rash.  It was a pleasure meeting you!
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
## 2016-01-26 NOTE — Progress Notes (Signed)
Pre visit review using our clinic review tool, if applicable. No additional management support is needed unless otherwise documented below in the visit note. 

## 2016-02-03 ENCOUNTER — Telehealth: Payer: Self-pay | Admitting: Family Medicine

## 2016-02-03 NOTE — Telephone Encounter (Signed)
Pt has appt with Dr Deborra Medina on 02/04/16 at 8 Am.

## 2016-02-03 NOTE — Telephone Encounter (Signed)
Patient Name: JAKODA MULLINGS  DOB: 10-05-1961    Initial Comment Caller states husband c/o rash on torso   Nurse Assessment  Nurse: Leilani Merl, RN, Heather Date/Time (Eastern Time): 02/03/2016 10:48:47 AM  Confirm and document reason for call. If symptomatic, describe symptoms. You must click the next button to save text entered. ---Caller states that he has had a rash for a month and has been to see 2 doctors, he was given prednisone last week and the rash went away, but since he stopped the med, the rash came back.  Has the patient traveled out of the country within the last 30 days? ---Not Applicable  Does the patient have any new or worsening symptoms? ---Yes  Will a triage be completed? ---Yes  Related visit to physician within the last 2 weeks? ---No  Does the PT have any chronic conditions? (i.e. diabetes, asthma, etc.) ---Yes  List chronic conditions. ---See MR  Is this a behavioral health or substance abuse call? ---No     Guidelines    Guideline Title Affirmed Question Affirmed Notes  Rash or Redness - Widespread SEVERE itching (i.e., interferes with sleep, normal activities or school)    Final Disposition User   See Physician within 24 Hours Stand[place], RN, Water quality scientist    Comments  Appt made tomorrow with Dr. Deborra Medina at 8 am.   Referrals  REFERRED TO PCP OFFICE   Disagree/Comply: Comply

## 2016-02-04 ENCOUNTER — Encounter: Payer: Self-pay | Admitting: Family Medicine

## 2016-02-04 ENCOUNTER — Ambulatory Visit (INDEPENDENT_AMBULATORY_CARE_PROVIDER_SITE_OTHER): Payer: Managed Care, Other (non HMO) | Admitting: Family Medicine

## 2016-02-04 VITALS — BP 138/90 | HR 100 | Temp 97.8°F | Wt 245.0 lb

## 2016-02-04 DIAGNOSIS — R21 Rash and other nonspecific skin eruption: Secondary | ICD-10-CM | POA: Diagnosis not present

## 2016-02-04 MED ORDER — PREDNISONE 10 MG PO TABS: ORAL_TABLET | ORAL | Status: DC

## 2016-02-04 NOTE — Assessment & Plan Note (Signed)
Deteriorated. Does not seem consistent with scabies. Does seem allergic- ? Something he is getting on his clothes at work.  He is a Furniture conservator/restorer.  He does report that he can use a protective apron and I advised him to do so. Repeat course of prednisone (watch blood sugars), refer to allergist. The patient indicates understanding of these issues and agrees with the plan.

## 2016-02-04 NOTE — Patient Instructions (Signed)
Great to see you. Try wearing apron.  Prednisone as directed. We will call you with an allergist appointment.   Watch your sugars.
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
## 2016-02-04 NOTE — Progress Notes (Signed)
Pre visit review using our clinic review tool, if applicable. No additional management support is needed unless otherwise documented below in the visit note. 

## 2016-02-24 ENCOUNTER — Other Ambulatory Visit: Payer: Self-pay | Admitting: Family Medicine

## 2016-03-24 ENCOUNTER — Telehealth: Payer: Self-pay | Admitting: *Deleted

## 2016-03-24 DIAGNOSIS — I10 Essential (primary) hypertension: Secondary | ICD-10-CM

## 2016-03-24 MED ORDER — SPIRONOLACTONE 25 MG PO TABS: 25.0000 mg | ORAL_TABLET | Freq: Every day | ORAL | Status: DC

## 2016-03-24 NOTE — Telephone Encounter (Signed)
Stop HCTZ, stop potassium. Start spironolactone 25mg  daily.  Recommend return in 1 week to check labs on new medicine

## 2016-03-24 NOTE — Telephone Encounter (Signed)
Pt left voicemail at Triage. Pt said he has had an "itching problem" for 2 months and he said he realizes now that the itching problem is coming from his HCTZ, pt is requesting a different fluid pill sent in to his pharmacy

## 2016-03-24 NOTE — Telephone Encounter (Signed)
Spoke to pt and advised per Dr Darnell Level. F/u labs scheduled

## 2016-03-31 ENCOUNTER — Other Ambulatory Visit: Payer: Managed Care, Other (non HMO)

## 2016-04-07 ENCOUNTER — Telehealth: Payer: Self-pay

## 2016-04-07 MED ORDER — METOPROLOL TARTRATE 25 MG PO TABS: 25.0000 mg | ORAL_TABLET | Freq: Two times a day (BID) | ORAL | Status: DC

## 2016-04-07 NOTE — Telephone Encounter (Signed)
Patient notified as instructed by telephone and verbalized understanding. Patient stated that he is not having any airway sx, but if he develops any he will call 911. Patient stated that he is fine now, just concerned about the rash and he feels like it is from the spironolactone. Advised patient that message will go to Dr. Darnell Level and his CMA will be back in touch with him regarding this problem.

## 2016-04-07 NOTE — Telephone Encounter (Signed)
Agree. Stop spironolactone. hctz caused itching. Let's try metoprolol 25mg  bid - sent to pharmacy. Will help blood pressure and slow heart rate a bit as well - but may lower sugars a few points - monitor for hypoglycemia.  rec return for f/u in the next month.

## 2016-04-07 NOTE — Telephone Encounter (Signed)
Stop med, I'll defer to Dr. Darnell Level about the long term mgmt.   As long as no airway sx, he should be fine off med.   If any airway sx, to ER or dial 911.   Thanks.

## 2016-04-07 NOTE — Telephone Encounter (Signed)
Pt said taking spironolactone 2- 3 days and 2 hours after takes spironolactone is nauseated and has rash on chest and back; pt also shaking; No swelling at mouth or throat or difficulty swallowing.pt will stop med until hears from Dr Darnell Level. [facility] [address] Dr Darnell Level out of office and will send note to Dr Damita Dunnings.

## 2016-04-08 NOTE — Telephone Encounter (Signed)
Patient notified and appt scheduled.

## 2016-04-15 ENCOUNTER — Other Ambulatory Visit: Payer: Managed Care, Other (non HMO)

## 2016-04-20 MED ORDER — FUROSEMIDE 20 MG PO TABS
10.0000 mg | ORAL_TABLET | Freq: Every day | ORAL | Status: DC | PRN
Start: 2016-04-20 — End: 2016-07-20

## 2016-04-20 NOTE — Telephone Encounter (Addendum)
May try stronger water pill as needed lasix 1/2-1 tablet daily PRN swelling - if needing regularly will need to restart potassium supplementation. Let us know if not tolerated or any concerns. Keep appt for next month.   Also ensure - keeping legs elevated, avoiding salt in diet, drinking plenty of water.

## 2016-04-20 NOTE — Telephone Encounter (Signed)
Pt left v/m; pt taking new med but feet and ankles are very swollen. Pt request cb.[facility] [address].

## 2016-04-20 NOTE — Telephone Encounter (Signed)
Message left for patient to return my call.  

## 2016-04-20 NOTE — Telephone Encounter (Signed)
Pt returned your call - please call [phone]

## 2016-04-20 NOTE — Addendum Note (Signed)
Addended by: Ria Bush on: 04/20/2016 01:56 PM   Modules accepted: Orders

## 2016-04-20 NOTE — Telephone Encounter (Signed)
Patient notified and verbalized understanding. 

## 2016-05-10 ENCOUNTER — Encounter: Payer: Self-pay | Admitting: *Deleted

## 2016-05-12 ENCOUNTER — Encounter: Payer: Self-pay | Admitting: *Deleted

## 2016-05-12 ENCOUNTER — Ambulatory Visit
Admission: RE | Admit: 2016-05-12 | Discharge: 2016-05-12 | Disposition: A | Discharge status: Home / Self Care | Payer: Managed Care, Other (non HMO) | Source: Ambulatory Visit | Attending: Surgery | Admitting: Surgery

## 2016-05-12 ENCOUNTER — Ambulatory Visit: Payer: Managed Care, Other (non HMO) | Admitting: Anesthesiology

## 2016-05-12 ENCOUNTER — Encounter
Admission: RE | Disposition: A | Discharge status: Home / Self Care | Payer: Self-pay | Source: Ambulatory Visit | Attending: Surgery

## 2016-05-12 DIAGNOSIS — F17210 Nicotine dependence, cigarettes, uncomplicated: Secondary | ICD-10-CM | POA: Insufficient documentation

## 2016-05-12 DIAGNOSIS — S52552P Other extraarticular fracture of lower end of left radius, subsequent encounter for closed fracture with malunion: Secondary | ICD-10-CM | POA: Insufficient documentation

## 2016-05-12 DIAGNOSIS — M1990 Unspecified osteoarthritis, unspecified site: Secondary | ICD-10-CM | POA: Diagnosis not present

## 2016-05-12 DIAGNOSIS — Z794 Long term (current) use of insulin: Secondary | ICD-10-CM | POA: Insufficient documentation

## 2016-05-12 DIAGNOSIS — K219 Gastro-esophageal reflux disease without esophagitis: Secondary | ICD-10-CM | POA: Diagnosis not present

## 2016-05-12 DIAGNOSIS — E109 Type 1 diabetes mellitus without complications: Secondary | ICD-10-CM | POA: Insufficient documentation

## 2016-05-12 DIAGNOSIS — I10 Essential (primary) hypertension: Secondary | ICD-10-CM | POA: Insufficient documentation

## 2016-05-12 HISTORY — PX: ORIF WRIST FRACTURE: SHX2133

## 2016-05-12 LAB — GLUCOSE, CAPILLARY
Glucose-Capillary: 252 mg/dL — ABNORMAL HIGH (ref 65–99)
Glucose-Capillary: 123 mg/dL — ABNORMAL HIGH (ref 65–99)
Glucose-Capillary: 251 mg/dL — ABNORMAL HIGH (ref 65–99)

## 2016-05-12 SURGERY — OPEN REDUCTION INTERNAL FIXATION (ORIF) WRIST FRACTURE
Anesthesia: Monitor Anesthesia Care | Laterality: Left | Wound class: Clean

## 2016-05-12 MED ORDER — ONDANSETRON HCL 4 MG PO TABS: 4.0000 mg | ORAL_TABLET | Freq: Four times a day (QID) | ORAL | Status: DC | PRN

## 2016-05-12 MED ORDER — METOCLOPRAMIDE HCL 5 MG/ML IJ SOLN: 5.0000 mg | Freq: Three times a day (TID) | INTRAMUSCULAR | Status: DC | PRN

## 2016-05-12 MED ORDER — METOCLOPRAMIDE HCL 5 MG PO TABS: 5.0000 mg | ORAL_TABLET | Freq: Three times a day (TID) | ORAL | Status: DC | PRN

## 2016-05-12 MED ORDER — PROMETHAZINE HCL 25 MG/ML IJ SOLN: 6.2500 mg | INTRAMUSCULAR | Status: DC | PRN

## 2016-05-12 MED ORDER — LIDOCAINE HCL (CARDIAC) 20 MG/ML IV SOLN
INTRAVENOUS | Status: DC | PRN
  Administered 2016-05-12: 30 mg via INTRAVENOUS

## 2016-05-12 MED ORDER — INSULIN REGULAR HUMAN 100 UNIT/ML IJ SOLN
5.0000 [IU] | Freq: Once | INTRAMUSCULAR | Status: AC
  Administered 2016-05-12: 5 [IU] via INTRAVENOUS

## 2016-05-12 MED ORDER — OXYCODONE HCL 5 MG/5ML PO SOLN
5.0000 mg | Freq: Once | ORAL | Status: DC | PRN
Start: 2016-05-12 — End: 2016-05-12

## 2016-05-12 MED ORDER — BUPIVACAINE HCL (PF) 0.5 % IJ SOLN
INTRAMUSCULAR | Status: DC | PRN
Start: 2016-05-12 — End: 2016-05-12
  Administered 2016-05-12: 19 mL

## 2016-05-12 MED ORDER — ONDANSETRON HCL 4 MG/2ML IJ SOLN: 4.0000 mg | Freq: Four times a day (QID) | INTRAMUSCULAR | Status: DC | PRN

## 2016-05-12 MED ORDER — DEXAMETHASONE SODIUM PHOSPHATE 4 MG/ML IJ SOLN
INTRAMUSCULAR | Status: DC | PRN
  Administered 2016-05-12: 4 mg via PERINEURAL

## 2016-05-12 MED ORDER — LACTATED RINGERS IV SOLN
INTRAVENOUS | Status: DC
  Administered 2016-05-12: 08:00:00 via INTRAVENOUS

## 2016-05-12 MED ORDER — HYDROMORPHONE HCL 1 MG/ML IJ SOLN: 0.2500 mg | INTRAMUSCULAR | Status: DC | PRN

## 2016-05-12 MED ORDER — OXYCODONE HCL 5 MG PO TABS: 5.0000 mg | ORAL_TABLET | Freq: Once | ORAL | Status: DC | PRN

## 2016-05-12 MED ORDER — MEPERIDINE HCL 25 MG/ML IJ SOLN: 6.2500 mg | INTRAMUSCULAR | Status: DC | PRN

## 2016-05-12 MED ORDER — POTASSIUM CHLORIDE IN NACL 20-0.9 MEQ/L-% IV SOLN: INTRAVENOUS | Status: DC

## 2016-05-12 MED ORDER — PROPOFOL 500 MG/50ML IV EMUL
INTRAVENOUS | Status: DC | PRN
  Administered 2016-05-12: 100 ug/kg/min via INTRAVENOUS

## 2016-05-12 MED ORDER — ROPIVACAINE HCL 5 MG/ML IJ SOLN
INTRAMUSCULAR | Status: DC | PRN
  Administered 2016-05-12: 200 mg via PERINEURAL

## 2016-05-12 MED ORDER — FENTANYL CITRATE (PF) 100 MCG/2ML IJ SOLN
INTRAMUSCULAR | Status: DC | PRN
  Administered 2016-05-12: 100 ug via INTRAVENOUS

## 2016-05-12 MED ORDER — OXYCODONE HCL 5 MG PO TABS: 5.0000 mg | ORAL_TABLET | ORAL | Status: DC | PRN

## 2016-05-12 MED ORDER — MIDAZOLAM HCL 2 MG/2ML IJ SOLN
INTRAMUSCULAR | Status: DC | PRN
  Administered 2016-05-12: 2 mg via INTRAVENOUS

## 2016-05-12 MED ORDER — CEFAZOLIN SODIUM-DEXTROSE 2-4 GM/100ML-% IV SOLN
2.0000 g | Freq: Once | INTRAVENOUS | Status: AC
  Administered 2016-05-12: 2 g via INTRAVENOUS

## 2016-05-12 SURGICAL SUPPLY — 49 items
BANDAGE ELASTIC 4 VELCRO NS (GAUZE/BANDAGES/DRESSINGS) ×2 IMPLANT
BNDG COHESIVE 4X5 TAN STRL (GAUZE/BANDAGES/DRESSINGS) ×2 IMPLANT
BNDG ESMARK 4X12 TAN STRL LF (GAUZE/BANDAGES/DRESSINGS) ×2 IMPLANT
CANISTER SUCT 1200ML W/VALVE (MISCELLANEOUS) ×2 IMPLANT
CHLORAPREP W/TINT 26ML (MISCELLANEOUS) ×4 IMPLANT
CORD BIP STRL DISP 12FT (MISCELLANEOUS) ×2 IMPLANT
CUFF TOURN SGL QUICK 18 (TOURNIQUET CUFF) ×2 IMPLANT
DRAPE FLUOR MINI C-ARM 54X84 (DRAPES) ×2 IMPLANT
DRAPE IMP U-DRAPE 54X76 (DRAPES) ×2 IMPLANT
DRAPE SURG 17X11 SM STRL (DRAPES) ×2 IMPLANT
DRILL BIT ×2 IMPLANT
DVR Crosslock LT STD (Plate) ×2 IMPLANT
ELECT CAUTERY BLADE TIP 2.5 (TIP) ×2
ELECT REM PT RETURN 9FT ADLT (ELECTROSURGICAL) ×2
ELECTRODE CAUTERY BLDE TIP 2.5 (TIP) ×1 IMPLANT
ELECTRODE REM PT RTRN 9FT ADLT (ELECTROSURGICAL) ×1 IMPLANT
GAUZE PETRO XEROFOAM 1X8 (MISCELLANEOUS) ×2 IMPLANT
GAUZE SPONGE 4X4 12PLY STRL (GAUZE/BANDAGES/DRESSINGS) ×2 IMPLANT
GLOVE BIO SURGEON STRL SZ8 (GLOVE) ×2 IMPLANT
GLOVE INDICATOR 8.0 STRL GRN (GLOVE) ×2 IMPLANT
GOWN STRL REUS W/ TWL LRG LVL3 (GOWN DISPOSABLE) ×1 IMPLANT
GOWN STRL REUS W/ TWL XL LVL3 (GOWN DISPOSABLE) ×1 IMPLANT
GOWN STRL REUS W/TWL LRG LVL3 (GOWN DISPOSABLE) ×1
GOWN STRL REUS W/TWL XL LVL3 (GOWN DISPOSABLE) ×1
INDICATOR STEAM BIO RAPID (STERILIZATION PRODUCTS) ×2 IMPLANT
K-WIRE 1.6 (WIRE) ×2
K-WIRE FX5X1.6XNS BN SS (WIRE) ×2
KIT ROOM TURNOVER OR (KITS) ×2 IMPLANT
KWIRE FX5X1.6XNS BN SS (WIRE) ×2 IMPLANT
NEEDLE FILTER BLUNT 18X 1/2SAF (NEEDLE) ×1
NEEDLE FILTER BLUNT 18X1 1/2 (NEEDLE) ×1 IMPLANT
NS IRRIG 500ML POUR BTL (IV SOLUTION) ×2 IMPLANT
PACK EXTREMITY ARMC (MISCELLANEOUS) ×2 IMPLANT
PAD CAST CTTN 4X4 STRL (SOFTGOODS) ×2 IMPLANT
PADDING CAST COTTON 4X4 STRL (SOFTGOODS) ×2
PUTTY DBM OPTIUM 1CC (Bone Implant) ×2 IMPLANT
SLING ARM LRG DEEP (SOFTGOODS) ×2 IMPLANT
SPLINT CAST 1 STEP 3X12 (MISCELLANEOUS) ×2 IMPLANT
STAPLER SKIN PROX 35W (STAPLE) ×2 IMPLANT
STOCKINETTE IMPERVIOUS 9X36 MD (GAUZE/BANDAGES/DRESSINGS) ×2 IMPLANT
SUT PROLENE 4 0 PS 2 18 (SUTURE) IMPLANT
SUT VIC AB 2-0 SH 27 (SUTURE) ×1
SUT VIC AB 2-0 SH 27XBRD (SUTURE) ×1 IMPLANT
SUT VIC AB 3-0 SH 27 (SUTURE) ×1
SUT VIC AB 3-0 SH 27X BRD (SUTURE) ×1 IMPLANT
SYRINGE 10CC LL (SYRINGE) ×2 IMPLANT
Screw locking (Screw) ×8 IMPLANT
screw locking (Screw) ×14 IMPLANT
smooth peg (Peg) ×2 IMPLANT

## 2016-05-12 NOTE — H&P (Signed)
Paper H&P to be scanned into permanent record. H&P reviewed. No changes. 

## 2016-05-12 NOTE — Progress Notes (Signed)
Assisted Joshua Friedman ANDO with left, ultrasound guided, supraclavicular block. Side rails up, monitors on throughout procedure. See vital signs in flow sheet. Tolerated Procedure well.  

## 2016-05-12 NOTE — Discharge Instructions (Signed)
General Anesthesia, Adult, Care After Refer to this sheet in the next few weeks. These instructions provide you with information on caring for yourself after your procedure. Your health care provider may also give you more specific instructions. Your treatment has been planned according to current medical practices, but problems sometimes occur. Call your health care provider if you have any problems or questions after your procedure. WHAT TO EXPECT AFTER THE PROCEDURE After the procedure, it is typical to experience:  Sleepiness.  Nausea and vomiting. HOME CARE INSTRUCTIONS  For the first 24 hours after general anesthesia:  Have a responsible person with you.  Do not drive a car. If you are alone, do not take public transportation.  Do not drink alcohol.  Do not take medicine that has not been prescribed by your health care provider.  Do not sign important papers or make important decisions.  You may resume a normal diet and activities as directed by your health care provider.  Change bandages (dressings) as directed.  If you have questions or problems that seem related to general anesthesia, call the hospital and ask for the anesthetist or anesthesiologist on call. SEEK MEDICAL CARE IF:  You have nausea and vomiting that continue the day after anesthesia.  You develop a rash. SEEK IMMEDIATE MEDICAL CARE IF:   You have difficulty breathing.  You have chest pain.  You have any allergic problems.   This information is not intended to replace advice given to you by your health care provider. Make sure you discuss any questions you have with your health care provider.   Document Released: 01/31/2001 Document Revised: 11/15/2014 Document Reviewed: 02/23/2012 Elsevier Interactive Patient Education 2016 [employer].  Keep splint dry and intact. Keep hand elevated above heart level. Apply ice to affected area frequently. Return for follow-up in 10-14 days or as scheduled.

## 2016-05-12 NOTE — Op Note (Signed)
Pre-Op Diagnosis:   Closed sub-acute extra-articular distal radius fracture with loss of reduction and impending malunion, left wrist.  Post-Op Diagnosis:   Same.  Procedure:   Takedown of nonunion/malunion and open reduction and internal fixation of left distal radius fracture.  Surgeon:   Pascal Lux, MD  Assistant:   Alferd Apa, PA-S  Anesthesia:   IV sedation with interscalene block  Findings:   As above.  Complications:   None  EBL:   5 cc  Fluids:   800 cc crystalloid  TT:   100 minutes at 250 mmHg  Drains:   None  Closure:   3-0 Vicryl subcuticular sutures  Implants:   Biomet DVR Cross-locked narrow precontoured distal radius mini-locking plate.  Brief Clinical Note:   The patient is a 55 year old male who sustained the above-noted injury nearly 7 weeks ago. Initially, the fracture showed only minimal displacement and was treated nonsurgically. However, sometime prior to his recent follow-up one week ago, he removed his splint and began to move his wrist, resulting in loss of wrist fracture reduction and unsatisfactory appearance of the fracture on x-rays in the office last week. He presents at this time for definitive management of his injury.  Procedure:   The patient underwent placement of an interscalene block in the preoperative holding area before he was brought into the operating room and lain in the supine position. After adequate IV anesthesia was obtained, a timeout was performed to verify the appropriate surgical site. The patient's left hand and upper extremity were prepped with ChloraPrep solution before being draped sterilely. Preoperative antibiotics were administered. The limb was exsanguinated with an Esmarch and the tourniquet inflated to 250 mmHg. An approximately 7-8 cm incision was made over the volar aspect of the distal radius beginning at the volar flexion crease and extending proximally along the flexor carpi radialis tendon. The incision was  carried down through the subcutaneous tissues to expose the superficial retinaculum. This was split the length of the incision directly over the flexor carpi radialis tendon. The FCR sheath was opened and the tendon retracted ulnarly to protect the median nerve. The floor of the FCR sheath was opened to expose the pronator quadratus muscle. This was released along the radial insertion and the muscle was retracted ulnarly to expose the distal radius. The fracture was identified and soft tissues elevated off the distal metaphyseal region for several centimeters. The early callus was removed and taken to the back table where was saved for possible later use as bone graft. Under FluoroScan guidance, a guidewire was placed up through the fracture site to facilitate takedown of the malunited fracture. This was accomplished using a small osteotome. The adequacy of fracture re-creation was verified using FluoroScan guidance in AP and lateral projections and found to be excellent. The appropriate sized plate was selected and positioned on the distal radius. A guidewire was placed through the distal hole and its position verified using FluoroScan imaging in AP and lateral projections. After several attempts, the pin was positioned parallel to the distal articular surface and approximately 3-4 mm proximal to the articular surface. The plate was carefully lowered onto the volar metaphyseal surface, reducing the fracture in the process. The plate was secured using a nonlocking bicortical screw proximally in the distalmost portion of the slotted hole. Distally, a nonlocking cortical screw was placed in the ulnar most hole of the more proximal row. The other holes were filled with locking pegs and or screws of appropriate lengths. A  guidewire was placed into the radial shaft proximal to the plate. Using a distractor, the fracture was brought out to length and reduced with the plate sliding on the single screw already placed. The  adequacy of hardware position and fracture reduction was verified using FluoroScan imaging in AP, lateral, and several additional oblique projections to be sure that the hardware did not enter the joint nor did it penetrate dorsally. Two additional bicortical nonlocking screws were placed proximally followed by two locking cortical screws to secure the plate to the metaphyseal region proximally. Again the construct was assessed using FluoroScan imaging in AP, lateral, and oblique projections and found to be excellent.  The wound was copiously irrigated with sterile saline solution before the pronator quadratus was reapproximated using 2-0 Vicryl interrupted sutures. The subcutaneous tissues were closed using 2-0 Vicryl interrupted sutures before the subcuticular layer was closed using 3-0 Vicryl inverted interrupted sutures. Benzoin and Steri-Strips are applied to the skin. A total of 10 cc of 0.5% plain Sensorcaine was injected in and around the incision site to help with postoperative analgesia before a sterile bulky dressing was applied to the wound. The patient was placed into a volar splint maintaining the wrist in neutral position before the patient was awakened and returned to the recovery room in satisfactory condition after tolerating the procedure well.

## 2016-05-12 NOTE — Anesthesia Postprocedure Evaluation (Signed)
Anesthesia Post Note  Patient: Russell Gomez  Procedure(s) Performed: Procedure(s) (LRB): OPEN REDUCTION INTERNAL FIXATION (ORIF) WRIST FRACTURE (Left)  Patient location during evaluation: PACU Anesthesia Type: Regional and MAC Level of consciousness: awake and alert and oriented Pain management: pain level controlled Vital Signs Assessment: post-procedure vital signs reviewed and stable Respiratory status: spontaneous breathing and nonlabored ventilation Cardiovascular status: stable Postop Assessment: no signs of nausea or vomiting and adequate PO intake Anesthetic complications: no    Estill Batten

## 2016-05-12 NOTE — Anesthesia Preprocedure Evaluation (Addendum)
Anesthesia Evaluation  Patient identified by MRN, date of birth, ID band Patient awake    Reviewed: Allergy & Precautions, NPO status , Patient's Chart, lab work & pertinent test results  Airway Mallampati: II  TM Distance: >3 FB Neck ROM: Full    Dental  (+) Missing Missing several upper and lower teeth:   Pulmonary Current Smoker,    Pulmonary exam normal        Cardiovascular hypertension, Pt. on medications and Pt. on home beta blockers Normal cardiovascular exam     Neuro/Psych negative psych ROS   GI/Hepatic Neg liver ROS, GERD  Medicated and Controlled,  Endo/Other  diabetes, Well Controlled, Type 1, Insulin Dependent  Renal/GU negative Renal ROS     Musculoskeletal  (+) Arthritis ,   Abdominal   Peds  Hematology negative hematology ROS (+)   Anesthesia Other Findings   Reproductive/Obstetrics                            Anesthesia Physical Anesthesia Plan  ASA: II  Anesthesia Plan: MAC and Regional   Post-op Pain Management:  Regional for Post-op pain   Induction: Intravenous  Airway Management Planned:   Additional Equipment:   Intra-op Plan:   Post-operative Plan:   Informed Consent: I have reviewed the patients History and Physical, chart, labs and discussed the procedure including the risks, benefits and alternatives for the proposed anesthesia with the patient or authorized representative who has indicated his/her understanding and acceptance.     Plan Discussed with: CRNA  Anesthesia Plan Comments:         Anesthesia Quick Evaluation

## 2016-05-12 NOTE — Transfer of Care (Signed)
Immediate Anesthesia Transfer of Care Note  Patient: Russell Gomez  Procedure(s) Performed: Procedure(s) with comments: OPEN REDUCTION INTERNAL FIXATION (ORIF) WRIST FRACTURE (Left) - BIOMET Diabetic - insulin PER CECE MOVE TO 2ND NOW 7-3 KP  Patient Location: PACU  Anesthesia Type: MAC, Regional  Level of Consciousness: awake, alert  and patient cooperative  Airway and Oxygen Therapy: Patient Spontanous Breathing and Patient connected to supplemental oxygen  Post-op Assessment: Post-op Vital signs reviewed, Patient's Cardiovascular Status Stable, Respiratory Function Stable, Patent Airway and No signs of Nausea or vomiting  Post-op Vital Signs: Reviewed and stable  Complications: No apparent anesthesia complications

## 2016-05-12 NOTE — Anesthesia Procedure Notes (Addendum)
Anesthesia Regional Block:  Supraclavicular block  Pre-Anesthetic Checklist: ,, timeout performed, Correct Patient, Correct Site, Correct Laterality, Correct Procedure, Correct Position, site marked, Risks and benefits discussed,  Surgical consent,  Pre-op evaluation,  At surgeon's request and post-op pain management  Laterality: Left  Prep: chloraprep       Needles:  Injection technique: Single-shot  Needle Type: Echogenic Stimulator Needle      Needle Gauge: 21 and 21 G    Additional Needles:  Procedures: ultrasound guided (picture in chart) and nerve stimulator Supraclavicular block  Nerve Stimulator or Paresthesia:  Response: bicep contraction, 0.45 mA,   Additional Responses:   Narrative:  Start time: 05/12/2016 8:25 AM End time: 05/12/2016 8:35 AM Injection made incrementally with aspirations every 5 mL.  Performed by: Personally  Anesthesiologist: Estill Batten  Additional Notes: Functioning IV was confirmed and monitors applied.  Sterile prep and drape,hand hygiene and sterile gloves were used.Ultrasound guidance: relevant anatomy identified, needle position confirmed, local anesthetic spread visualized around nerve(s)., vascular puncture avoided.  Image printed for medical record.  Negative aspiration and negative test dose prior to incremental administration of local anesthetic. The patient tolerated the procedure well. Vitals signes recorded in RN notes.   Procedure Name: MAC Performed by: Londell Moh Pre-anesthesia Checklist: Patient identified, Emergency Drugs available, Suction available, Timeout performed and Patient being monitored Patient Re-evaluated:Patient Re-evaluated prior to inductionOxygen Delivery Method: Nasal cannula Placement Confirmation: positive ETCO2    Performed by: Londell Moh Oxygen Delivery Method: Simple face mask

## 2016-05-13 ENCOUNTER — Encounter: Payer: Self-pay | Admitting: Surgery

## 2016-05-13 ENCOUNTER — Ambulatory Visit: Admit: 2016-05-13 | Payer: Self-pay | Admitting: Surgery

## 2016-05-13 SURGERY — OPEN REDUCTION INTERNAL FIXATION (ORIF) WRIST FRACTURE
Anesthesia: Choice | Laterality: Left

## 2016-05-14 ENCOUNTER — Ambulatory Visit: Payer: Managed Care, Other (non HMO) | Admitting: Family Medicine

## 2016-06-02 ENCOUNTER — Ambulatory Visit (INDEPENDENT_AMBULATORY_CARE_PROVIDER_SITE_OTHER): Payer: Managed Care, Other (non HMO) | Admitting: Family Medicine

## 2016-06-02 ENCOUNTER — Encounter: Payer: Self-pay | Admitting: Family Medicine

## 2016-06-02 VITALS — BP 130/80 | HR 64 | Temp 98.0°F | Wt 249.5 lb

## 2016-06-02 DIAGNOSIS — I10 Essential (primary) hypertension: Secondary | ICD-10-CM

## 2016-06-02 DIAGNOSIS — E109 Type 1 diabetes mellitus without complications: Secondary | ICD-10-CM | POA: Diagnosis not present

## 2016-06-02 DIAGNOSIS — F17200 Nicotine dependence, unspecified, uncomplicated: Secondary | ICD-10-CM | POA: Diagnosis not present

## 2016-06-02 DIAGNOSIS — E785 Hyperlipidemia, unspecified: Secondary | ICD-10-CM

## 2016-06-02 LAB — BASIC METABOLIC PANEL
BUN: 10 mg/dL (ref 6–23)
CHLORIDE: 102 meq/L (ref 96–112)
CO2: 29 meq/L (ref 19–32)
Calcium: 8.6 mg/dL (ref 8.4–10.5)
Creatinine, Ser: 0.67 mg/dL (ref 0.40–1.50)
GFR: 131.54 mL/min (ref >60.00)
GLUCOSE: 167 mg/dL — AB (ref 70–99)
POTASSIUM: 3.9 meq/L (ref 3.5–5.1)
SODIUM: 140 meq/L (ref 135–145)

## 2016-06-02 LAB — HEMOGLOBIN A1C: Hgb A1c MFr Bld: 5 % (ref 4.6–6.5)

## 2016-06-02 NOTE — Assessment & Plan Note (Signed)
Chronic, stable. Continue pravastatin 80mg daily. 

## 2016-06-02 NOTE — Assessment & Plan Note (Signed)
Chronic, stable. Continue current regimen. Check Cr and K today.

## 2016-06-02 NOTE — Progress Notes (Signed)
BP 130/80   Pulse 64   Temp 98 F (36.7 C) (Oral)   Wt 249 lb 8 oz (113.2 kg)   BMI 31.19 kg/m    CC: 35mo bp f/u visit Subjective:    Patient ID: Russell Gomez, male    DOB: 12-23-1960, 55 y.o.   MRN: [id number]  HPI: Russell Gomez is a 55 y.o. male presenting on 06/02/2016 for Follow-up   HTN - Compliant with current antihypertensive regimen of amlodipine 10mg  daily, enalapril 20mg  bid, metoprolol 25mg  BID, and lasix 20mg  QOD. Lasix was started for pedal edema. Does not check blood pressures at home.  No low blood pressure readings or symptoms of dizziness/syncope.  Denies HA, vision changes, CP/tightness, SOB, leg swelling.    HLD - compliant with pravastatin 80mg  daily without myalgias.  T1DM - sees endo Dr Eddie Dibbles. Overall stable period. Upcoming appt with endo next month.  Continues smoking 1 ppd.   Right handed. Prolonged recovery after L wrist fracture initially didn't heal well, s/p ORIF with plate. Sees Dr Roland Rack. Out of work for 4 months   Relevant past medical, surgical, family and social history reviewed and updated as indicated. Interim medical history since our last visit reviewed. Allergies and medications reviewed and updated. Current Outpatient Prescriptions on File Prior to Visit  Medication Sig  . amLODipine (NORVASC) 10 MG tablet TAKE 1 TABLET BY MOUTH ONE TIME DAILY  . aspirin (ASPIRIN EC) 81 MG EC tablet Take 81 mg by mouth daily. Swallow whole.  . enalapril (VASOTEC) 20 MG tablet TAKE 1 TABLET BY MOUTH TWICE DAILY  . furosemide (LASIX) 20 MG tablet Take 0.5-1 tablets (10-20 mg total) by mouth daily as needed for edema.  . insulin glargine (LANTUS) 100 UNIT/ML injection Inject 18 Units into the skin daily with breakfast.   . insulin regular (HUMULIN R,NOVOLIN R) 100 UNIT/ML injection Inject into the skin as directed. Sliding scale (5- 8 units with meals)  . metoCLOPramide (REGLAN) 10 MG tablet TAKE 1 TABLET BY MOUTH TWICE DAILY AS NEEDED  . metoprolol  tartrate (LOPRESSOR) 25 MG tablet Take 1 tablet (25 mg total) by mouth 2 (two) times daily.  Marland Kitchen omeprazole (PRILOSEC) 40 MG capsule TAKE 1 CAPSULE BY MOUTH EVERY DAY (MAY TAKE 2 CAPSULES AS DIRECTED)  . oxyCODONE (ROXICODONE) 5 MG immediate release tablet Take 1-2 tablets (5-10 mg total) by mouth every 4 (four) hours as needed for severe pain.  . pravastatin (PRAVACHOL) 80 MG tablet TAKE 1 TABLET BY MOUTH ONE TIME DAILY   No current facility-administered medications on file prior to visit.     Review of Systems Per HPI unless specifically indicated in ROS section     Objective:    BP 130/80   Pulse 64   Temp 98 F (36.7 C) (Oral)   Wt 249 lb 8 oz (113.2 kg)   BMI 31.19 kg/m   Wt Readings from Last 3 Encounters:  06/02/16 249 lb 8 oz (113.2 kg)  05/12/16 248 lb (112.5 kg)  02/04/16 245 lb (111.1 kg)    Physical Exam  Constitutional: He appears well-developed and well-nourished. No distress.  HENT:  Mouth/Throat: Oropharynx is clear and moist. No oropharyngeal exudate.  Cardiovascular: Normal rate, regular rhythm, normal heart sounds and intact distal pulses.   No murmur heard. Pulmonary/Chest: Effort normal and breath sounds normal. No respiratory distress. He has no wheezes. He has no rales.  Musculoskeletal: He exhibits no edema.  L wrist in brace  Skin: Skin is  warm and dry. No rash noted.  Psychiatric: He has a normal mood and affect.  Nursing note and vitals reviewed.     Assessment & Plan:   Problem List Items Addressed This Visit    Diabetes mellitus type 1, controlled ([place])    Appreciate endo care. Will send today's labs attn Dr Eddie Dibbles.       Relevant Orders   Basic metabolic panel   Hemoglobin A1c   Essential hypertension - Primary    Chronic, stable. Continue current regimen. Check Cr and K today.       HLD (hyperlipidemia)    Chronic, stable. Continue pravastatin 80mg  daily.       TOBACCO ABUSE    Continue to encourage cessation.        Other  Visit Diagnoses   None.      Follow up plan: Return in about 4 months (around 10/03/2016) for annual exam, prior fasting for blood work.  Ria Bush, MD

## 2016-06-02 NOTE — Assessment & Plan Note (Signed)
Continue to encourage cessation. 

## 2016-06-02 NOTE — Assessment & Plan Note (Signed)
Appreciate endo care. Will send today's labs attn Dr Eddie Dibbles.

## 2016-06-02 NOTE — Progress Notes (Signed)
Pre visit review using our clinic review tool, if applicable. No additional management support is needed unless otherwise documented below in the visit note. 

## 2016-06-02 NOTE — Patient Instructions (Signed)
Labs today. Continue current medicines. We will fax results to Dr Eddie Dibbles endo. You are doing well today Return as needed or in 4 months for physical.

## 2016-06-07 ENCOUNTER — Encounter: Payer: Self-pay | Admitting: *Deleted

## 2016-06-08 ENCOUNTER — Other Ambulatory Visit: Payer: Self-pay | Admitting: Family Medicine

## 2016-06-08 NOTE — Telephone Encounter (Signed)
Ok to refill? Last filled 04/21/15 #180 1 RF

## 2016-06-13 ENCOUNTER — Encounter: Payer: Self-pay | Admitting: Family Medicine

## 2016-06-22 ENCOUNTER — Telehealth: Payer: Self-pay

## 2016-06-22 NOTE — Telephone Encounter (Signed)
Pt left v/m that pt wants Dr Eddie Dibbles to start refilling diabetic meds; Dr Eddie Dibbles follows pts diabetes. Left v/m requesting pt to cb. Pt needs to contact Dr Jacqualine Code office about refills.

## 2016-07-20 ENCOUNTER — Other Ambulatory Visit: Payer: Self-pay | Admitting: Family Medicine

## 2016-07-20 ENCOUNTER — Telehealth: Payer: Self-pay | Admitting: Family Medicine

## 2016-07-20 NOTE — Telephone Encounter (Signed)
Is this despite lasix 20mg ?  Would offer trial 1/2 amlodipine (5mg  daily) and monitor blood pressures to ensure not increasing.

## 2016-07-20 NOTE — Telephone Encounter (Signed)
Message left for patient to return my call.  

## 2016-07-20 NOTE — Telephone Encounter (Signed)
Pt has appt with Dr Darnell Level on 07/22/16 at 10 AM.

## 2016-07-20 NOTE — Telephone Encounter (Signed)
[place] [facility] - Client [facility] Patient Name: Russell Gomez DOB: 1961-03-19 Initial Comment Caller states feet and ankles are swelling. He just went back to work for the first time in 4 months. Nurse Assessment Nurse: Dimas Chyle, RN, Dellis Filbert Date/Time Eilene Ghazi Time): 07/20/2016 8:48:53 AM Confirm and document reason for call. If symptomatic, describe symptoms. You must click the next button to save text entered. ---Caller states feet and ankles are swelling. He just went back to work for the first time in 4 months. Had previously broken left wrist. Takes diuretic. Has the patient traveled out of the country within the last 30 days? ---No Does the patient have any new or worsening symptoms? ---Yes Will a triage be completed? ---Yes Related visit to physician within the last 2 weeks? ---No Does the PT have any chronic conditions? (i.e. diabetes, asthma, etc.) ---Yes List chronic conditions. ---Diabetes type 2, HTN Is this a behavioral health or substance abuse call? ---No Guidelines Guideline Title Affirmed Question Affirmed Notes Leg Swelling and Edema [1] MILD swelling of both ankles (i.e., pedal edema) AND [2] new onset or worsening Final Disposition User See PCP When Office is Open (within 3 days) Dimas Chyle, RN, [employer] Referrals REFERRED TO PCP OFFICE Disagree/Comply: Leta Baptist

## 2016-07-21 NOTE — Telephone Encounter (Signed)
No please decrease amlodipine to 5mg  daily and monitor BP.

## 2016-07-21 NOTE — Telephone Encounter (Signed)
Patient notified and will call with update in 1-2 weeks. Appt for tomorrow cancelled. He will call earlier if any problems.

## 2016-07-21 NOTE — Telephone Encounter (Signed)
Spoke with patient. This is despite taking lasix 20mg  daily. He is already taking amlodipine 10mg  QD. Do you want him to take an additional 5mg ? His BP's are doing well (<140/90)

## 2016-07-22 ENCOUNTER — Ambulatory Visit: Payer: Self-pay | Admitting: Family Medicine

## 2016-09-08 NOTE — Telephone Encounter (Signed)
I spoke with pt and he is seeing Dr Eddie Dibbles and she is prescribing diabetic meds. Pt does not need anything else.

## 2016-09-21 MED ORDER — AMLODIPINE BESYLATE 5 MG PO TABS: 5.0000 mg | ORAL_TABLET | Freq: Every day | ORAL | 3 refills | Status: DC

## 2016-09-21 NOTE — Telephone Encounter (Signed)
Pt received letter from [facility] mail order about amlodipine being stopped. Pt cannot find letter now and does not know reason given for stopping amlodipine. Advised pt to ck with pharmacy. According to pts chart amlodipine was decreased to 5 mg daily. Pt said he has been doing well on decreased dose. 08/18/16 BP 113/69 after taking meds; on 08/29/16 BP 134/81 before taking meds. No H/A, dizziness, CP or SOB. Pt forgot to cb earlier with update. FYI to Dr Darnell Level.

## 2016-09-21 NOTE — Telephone Encounter (Signed)
Rx sent in to mail order and patient's wife notified.

## 2016-09-21 NOTE — Telephone Encounter (Signed)
Needs to continue amlodipine 5mg  daily. I'm glad he's doing well on this dose.

## 2016-09-21 NOTE — Addendum Note (Signed)
Addended by: Royann Shivers A on: 09/21/2016 02:44 PM   Modules accepted: Orders

## 2016-10-12 ENCOUNTER — Other Ambulatory Visit: Payer: Self-pay | Admitting: Family Medicine

## 2016-10-12 DIAGNOSIS — I10 Essential (primary) hypertension: Secondary | ICD-10-CM

## 2016-11-05 ENCOUNTER — Other Ambulatory Visit: Payer: Self-pay | Admitting: Family Medicine

## 2016-11-08 LAB — HM DIABETES EYE EXAM

## 2016-11-23 ENCOUNTER — Encounter: Payer: Self-pay | Admitting: Family Medicine

## 2016-11-23 ENCOUNTER — Ambulatory Visit (INDEPENDENT_AMBULATORY_CARE_PROVIDER_SITE_OTHER): Payer: Managed Care, Other (non HMO) | Admitting: Family Medicine

## 2016-11-23 VITALS — BP 132/82 | HR 88 | Temp 97.8°F | Ht 75.0 in | Wt 251.0 lb

## 2016-11-23 DIAGNOSIS — I10 Essential (primary) hypertension: Secondary | ICD-10-CM

## 2016-11-23 DIAGNOSIS — E041 Nontoxic single thyroid nodule: Secondary | ICD-10-CM

## 2016-11-23 DIAGNOSIS — E109 Type 1 diabetes mellitus without complications: Secondary | ICD-10-CM | POA: Diagnosis not present

## 2016-11-23 DIAGNOSIS — E785 Hyperlipidemia, unspecified: Secondary | ICD-10-CM | POA: Diagnosis not present

## 2016-11-23 DIAGNOSIS — R233 Spontaneous ecchymoses: Secondary | ICD-10-CM

## 2016-11-23 DIAGNOSIS — R238 Other skin changes: Secondary | ICD-10-CM

## 2016-11-23 DIAGNOSIS — Z1211 Encounter for screening for malignant neoplasm of colon: Secondary | ICD-10-CM

## 2016-11-23 DIAGNOSIS — F17200 Nicotine dependence, unspecified, uncomplicated: Secondary | ICD-10-CM

## 2016-11-23 DIAGNOSIS — Z125 Encounter for screening for malignant neoplasm of prostate: Secondary | ICD-10-CM | POA: Diagnosis not present

## 2016-11-23 DIAGNOSIS — Z0000 Encounter for general adult medical examination without abnormal findings: Secondary | ICD-10-CM | POA: Diagnosis not present

## 2016-11-23 LAB — CBC WITH DIFFERENTIAL/PLATELET
BASOS ABS: 0 10*3/uL (ref 0.0–0.1)
Basophils Relative: 0.2 % (ref 0.0–3.0)
EOS ABS: 0.3 10*3/uL (ref 0.0–0.7)
EOS PCT: 2.9 % (ref 0.0–5.0)
HCT: 39.8 % (ref 39.0–52.0)
HEMOGLOBIN: 13.7 g/dL (ref 13.0–17.0)
Lymphocytes Relative: 26.7 % (ref 12.0–46.0)
Lymphs Abs: 2.7 10*3/uL (ref 0.7–4.0)
MCHC: 34.3 g/dL (ref 30.0–36.0)
MCV: 97.3 fl (ref 78.0–100.0)
MONO ABS: 0.8 10*3/uL (ref 0.1–1.0)
Monocytes Relative: 7.6 % (ref 3.0–12.0)
Neutro Abs: 6.2 10*3/uL (ref 1.4–7.7)
Neutrophils Relative %: 62.6 % (ref 43.0–77.0)
Platelets: 402 10*3/uL — ABNORMAL HIGH (ref 150.0–400.0)
RBC: 4.09 Mil/uL — AB (ref 4.22–5.81)
RDW: 15 % (ref 11.5–15.5)
WBC: 9.9 10*3/uL (ref 4.0–10.5)

## 2016-11-23 LAB — COMPREHENSIVE METABOLIC PANEL
ALBUMIN: 3.8 g/dL (ref 3.5–5.2)
ALT: 24 U/L (ref 0–53)
AST: 27 U/L (ref 0–37)
Alkaline Phosphatase: 92 U/L (ref 39–117)
BUN: 15 mg/dL (ref 6–23)
CO2: 34 mEq/L — ABNORMAL HIGH (ref 19–32)
CREATININE: 0.94 mg/dL (ref 0.40–1.50)
Calcium: 9 mg/dL (ref 8.4–10.5)
Chloride: 102 mEq/L (ref 96–112)
GFR: 88.84 mL/min (ref >60.00)
GLUCOSE: 86 mg/dL (ref 70–99)
Potassium: 3.6 mEq/L (ref 3.5–5.1)
SODIUM: 142 meq/L (ref 135–145)
Total Bilirubin: 0.7 mg/dL (ref 0.2–1.2)
Total Protein: 6.3 g/dL (ref 6.0–8.3)

## 2016-11-23 LAB — PSA: PSA: 0.3 ng/mL (ref 0.10–4.00)

## 2016-11-23 LAB — LIPID PANEL
CHOLESTEROL: 186 mg/dL (ref 0–200)
HDL: 83.3 mg/dL (ref >39.00)
LDL CALC: 68 mg/dL (ref 0–99)
NONHDL: 102.7
Total CHOL/HDL Ratio: 2
Triglycerides: 174 mg/dL — ABNORMAL HIGH (ref 0.0–149.0)
VLDL: 34.8 mg/dL (ref 0.0–40.0)

## 2016-11-23 LAB — TSH: TSH: 0.28 u[IU]/mL — AB (ref 0.35–4.50)

## 2016-11-23 LAB — HEMOGLOBIN A1C: Hgb A1c MFr Bld: 6 % (ref 4.6–6.5)

## 2016-11-23 NOTE — Assessment & Plan Note (Signed)
Chronic, latest A1c 5.5% per patient, working towards loosening glycemic control.

## 2016-11-23 NOTE — Progress Notes (Signed)
BP 132/82   Pulse 88   Temp 97.8 F (36.6 C) (Oral)   Ht 6\' 3"  (1.905 m)   Wt 251 lb (113.9 kg)   BMI 31.37 kg/m    CC: CPE Subjective:    Patient ID: Russell Gomez, male    DOB: 1961-01-14, 56 y.o.   MRN: [id number]  HPI: Russell Gomez is a 56 y.o. male presenting on 11/23/2016 for Annual Exam   Father passed away 2016/12/04.   T1DM - sees Dr Eddie Dibbles endo. latest A1c 5.5% L wrist colles fracture s/o repair ([place]gi) - slowed recovery.  Smoker - chantix ineffective previously. Has considered patch. Currently not interested in cessation.   Preventative: Colon cancer screening - pos iFOB 01/2014 - pt attributed to hemorrhoids, declines colonoscopy.  Prostate cancer screening - discussed, would like to defer.Strong stream, nocturia x1. Lung cancer screening - eligible at age 76yo.  Flu - declines Pneumovax - 02/2015 Tetanus - 2010 Sunscreen use discussed, no changing moles on skin. Seat belt use discussed Smoker - 1 ppd Alcohol - <1 drink/day  Caffeine: 4-5 cups/night Lives with wife, youngest son (31), no pets Occupation: Works 3rd shift; Furniture conservator/restorer at [employer] Edu: 58yr Machinist degree Activity: walking, golfing  Diet: good water, fruits/vegetables daily  Relevant past medical, surgical, family and social history reviewed and updated as indicated. Interim medical history since our last visit reviewed. Allergies and medications reviewed and updated. Current Outpatient Prescriptions on File Prior to Visit  Medication Sig  . amLODipine (NORVASC) 5 MG tablet Take 1 tablet (5 mg total) by mouth daily.  Marland Kitchen aspirin (ASPIRIN EC) 81 MG EC tablet Take 81 mg by mouth daily. Swallow whole.  . enalapril (VASOTEC) 20 MG tablet TAKE 1 TABLET BY MOUTH TWICE DAILY  . furosemide (LASIX) 20 MG tablet TAKE ONE-HALF TO ONE TABLET BY MOUTH ONCE DAILY AS NEEDED FOR  EDEMA  . insulin glargine (LANTUS) 100 UNIT/ML injection Inject 18 Units into the skin daily with breakfast.   .  insulin regular (HUMULIN R,NOVOLIN R) 100 UNIT/ML injection Inject into the skin as directed. Sliding scale (5- 8 units with meals)  . metoCLOPramide (REGLAN) 10 MG tablet TAKE 1 TABLET BY MOUTH TWICE DAILY AS NEEDED  . metoprolol tartrate (LOPRESSOR) 25 MG tablet Take 1 tablet (25 mg total) by mouth 2 (two) times daily.  Marland Kitchen omeprazole (PRILOSEC) 40 MG capsule TAKE 1 CAPSULE BY MOUTH EVERY DAY (MAY TAKE 2 CAPSULES AS DIRECTED )  . pravastatin (PRAVACHOL) 80 MG tablet TAKE 1 TABLET BY MOUTH ONE TIME DAILY   No current facility-administered medications on file prior to visit.     Review of Systems  Constitutional: Positive for fever. Negative for activity change, appetite change, chills, fatigue and unexpected weight change.  HENT: Positive for congestion. Negative for hearing loss.        Recent URI  Eyes: Negative for visual disturbance.  Respiratory: Positive for cough. Negative for chest tightness, shortness of breath and wheezing.   Cardiovascular: Negative for chest pain, palpitations and leg swelling.  Gastrointestinal: Negative for abdominal distention, abdominal pain, blood in stool, constipation, diarrhea, nausea and vomiting.  Genitourinary: Negative for difficulty urinating and hematuria.  Musculoskeletal: Negative for arthralgias, myalgias and neck pain.  Skin: Negative for rash.  Neurological: Negative for dizziness, seizures, syncope and headaches.  Hematological: Negative for adenopathy. Does not bruise/bleed easily (thin skin, on aspirin).  Psychiatric/Behavioral: Negative for dysphoric mood. The patient is not nervous/anxious.    Per HPI unless  specifically indicated in ROS section     Objective:    BP 132/82   Pulse 88   Temp 97.8 F (36.6 C) (Oral)   Ht 6\' 3"  (1.905 m)   Wt 251 lb (113.9 kg)   BMI 31.37 kg/m   Wt Readings from Last 3 Encounters:  11/23/16 251 lb (113.9 kg)  06/02/16 249 lb 8 oz (113.2 kg)  05/12/16 248 lb (112.5 kg)    Physical Exam    Constitutional: He is oriented to person, place, and time. He appears well-developed and well-nourished. No distress.  HENT:  Head: Normocephalic and atraumatic.  Right Ear: Hearing, tympanic membrane, external ear and ear canal normal.  Left Ear: Hearing, tympanic membrane, external ear and ear canal normal.  Nose: Nose normal.  Mouth/Throat: Uvula is midline, oropharynx is clear and moist and mucous membranes are normal. No oropharyngeal exudate, posterior oropharyngeal edema or posterior oropharyngeal erythema.  Eyes: Conjunctivae and EOM are normal. Pupils are equal, round, and reactive to light. No scleral icterus.  Neck: Normal range of motion. Neck supple. No thyromegaly present.  Cardiovascular: Normal rate, regular rhythm, normal heart sounds and intact distal pulses.   No murmur heard. Pulses:      Radial pulses are 2+ on the right side, and 2+ on the left side.  Pulmonary/Chest: Effort normal and breath sounds normal. No respiratory distress. He has no wheezes. He has no rales.  Abdominal: Soft. Bowel sounds are normal. He exhibits no distension and no mass. There is no tenderness. There is no rebound and no guarding.  Musculoskeletal: Normal range of motion. He exhibits edema (tr).  Lymphadenopathy:    He has no cervical adenopathy.  Neurological: He is alert and oriented to person, place, and time.  CN grossly intact, station and gait intact  Skin: Skin is warm and dry. No rash noted.  Bruises throughout BUE  Psychiatric: He has a normal mood and affect. His behavior is normal. Judgment and thought content normal.  Nursing note and vitals reviewed.  Results for orders placed or performed in visit on [id number]  Basic metabolic panel  Result Value Ref Range   Sodium 140 135 - 145 mEq/L   Potassium 3.9 3.5 - 5.1 mEq/L   Chloride 102 96 - 112 mEq/L   CO2 29 19 - 32 mEq/L   Glucose, Bld 167 (H) 70 - 99 mg/dL   BUN 10 6 - 23 mg/dL   Creatinine, Ser 0.67 0.40 - 1.50 mg/dL    Calcium 8.6 8.4 - 10.5 mg/dL   GFR 131.54 >60.00 mL/min  Hemoglobin A1c  Result Value Ref Range   Hgb A1c MFr Bld 5.0 4.6 - 6.5 %   Lab Results  Component Value Date   PSA 0.15 08/20/2015   PSA 0.30 04/09/2009       Assessment & Plan:   Problem List Items Addressed This Visit    Colloid thyroid nodule    Update TSH. No nodule palpated on exam today.       Diabetes mellitus type 1, controlled ([place])    Chronic, latest A1c 5.5% per patient, working towards loosening glycemic control.       Relevant Orders   Hemoglobin A1c   Essential hypertension    Chronic, stable. Continue current regimen.       Health maintenance examination - Primary    Preventative protocols reviewed and updated unless pt declined. Discussed healthy diet and lifestyle.       HLD (hyperlipidemia)  Chronic, continue high dose pravastatin. Update FLP.       Relevant Orders   Lipid panel   Comprehensive metabolic panel   TSH   TOBACCO ABUSE    Continue to encourage cessation. Precontemplative.        Other Visit Diagnoses    Special screening for malignant neoplasms, colon       Relevant Orders   Fecal occult blood, imunochemical   Easy bruising       Relevant Orders   CBC with Differential/Platelet   Special screening for malignant neoplasm of prostate       Relevant Orders   PSA       Follow up plan: Return in about 6 months (around 05/23/2017) for follow up visit.  Ria Bush, MD

## 2016-11-23 NOTE — Assessment & Plan Note (Addendum)
Chronic, continue high dose pravastatin. Update FLP.

## 2016-11-23 NOTE — Assessment & Plan Note (Signed)
Update TSH. No nodule palpated on exam today.

## 2016-11-23 NOTE — Assessment & Plan Note (Signed)
Continue to encourage cessation. Precontemplative.  ?

## 2016-11-23 NOTE — Progress Notes (Signed)
Pre visit review using our clinic review tool, if applicable. No additional management support is needed unless otherwise documented below in the visit note. 

## 2016-11-23 NOTE — Assessment & Plan Note (Signed)
Chronic, stable. Continue current regimen. 

## 2016-11-23 NOTE — Patient Instructions (Addendum)
Pass by lab to pick up stool kit. Blood work today You are doing well otherwise. Work on incorporating some regular exercise into routine. Work on smoking.  Return as needed or in 6 months for follow up visit.   Health Maintenance, Male A healthy lifestyle and preventative care can promote health and wellness.  Maintain regular health, dental, and eye exams.  Eat a healthy diet. Foods like vegetables, fruits, whole grains, low-fat dairy products, and lean protein foods contain the nutrients you need and are low in calories. Decrease your intake of foods high in solid fats, added sugars, and salt. Get information about a proper diet from your health care provider, if necessary.  Regular physical exercise is one of the most important things you can do for your health. Most adults should get at least 150 minutes of moderate-intensity exercise (any activity that increases your heart rate and causes you to sweat) each week. In addition, most adults need muscle-strengthening exercises on 2 or more days a week.   Maintain a healthy weight. The body mass index (BMI) is a screening tool to identify possible weight problems. It provides an estimate of body fat based on height and weight. Your health care provider can find your BMI and can help you achieve or maintain a healthy weight. For males 20 years and older:  A BMI below 18.5 is considered underweight.  A BMI of 18.5 to 24.9 is normal.  A BMI of 25 to 29.9 is considered overweight.  A BMI of 30 and above is considered obese.  Maintain normal blood lipids and cholesterol by exercising and minimizing your intake of saturated fat. Eat a balanced diet with plenty of fruits and vegetables. Blood tests for lipids and cholesterol should begin at age 15 and be repeated every 5 years. If your lipid or cholesterol levels are high, you are over age 69, or you are at high risk for heart disease, you may need your cholesterol levels checked more  frequently.Ongoing high lipid and cholesterol levels should be treated with medicines if diet and exercise are not working.  If you smoke, find out from your health care provider how to quit. If you do not use tobacco, do not start.  Lung cancer screening is recommended for adults aged 24-80 years who are at high risk for developing lung cancer because of a history of smoking. A yearly low-dose CT scan of the lungs is recommended for people who have at least a 30-pack-year history of smoking and are current smokers or have quit within the past 15 years. A pack year of smoking is smoking an average of 1 pack of cigarettes a day for 1 year (for example, a 30-pack-year history of smoking could mean smoking 1 pack a day for 30 years or 2 packs a day for 15 years). Yearly screening should continue until the smoker has stopped smoking for at least 15 years. Yearly screening should be stopped for people who develop a health problem that would prevent them from having lung cancer treatment.  If you choose to drink alcohol, do not have more than 2 drinks per day. One drink is considered to be 12 oz (360 mL) of beer, 5 oz (150 mL) of wine, or 1.5 oz (45 mL) of liquor.  Avoid the use of street drugs. Do not share needles with anyone. Ask for help if you need support or instructions about stopping the use of drugs.  High blood pressure causes heart disease and increases the risk  of stroke. High blood pressure is more likely to develop in:  People who have blood pressure in the end of the normal range (100-139/85-89 mm Hg).  People who are overweight or obese.  People who are African American.  If you are 47-69 years of age, have your blood pressure checked every 3-5 years. If you are 42 years of age or older, have your blood pressure checked every year. You should have your blood pressure measured twice-once when you are at a hospital or clinic, and once when you are not at a hospital or clinic. Record the  average of the two measurements. To check your blood pressure when you are not at a hospital or clinic, you can use:  An automated blood pressure machine at a pharmacy.  A home blood pressure monitor.  If you are 50-38 years old, ask your health care provider if you should take aspirin to prevent heart disease.  Diabetes screening involves taking a blood sample to check your fasting blood sugar level. This should be done once every 3 years after age 31 if you are at a normal weight and without risk factors for diabetes. Testing should be considered at a younger age or be carried out more frequently if you are overweight and have at least 1 risk factor for diabetes.  Colorectal cancer can be detected and often prevented. Most routine colorectal cancer screening begins at the age of 34 and continues through age 71. However, your health care provider may recommend screening at an earlier age if you have risk factors for colon cancer. On a yearly basis, your health care provider may provide home test kits to check for hidden blood in the stool. A small camera at the end of a tube may be used to directly examine the colon (sigmoidoscopy or colonoscopy) to detect the earliest forms of colorectal cancer. Talk to your health care provider about this at age 68 when routine screening begins. A direct exam of the colon should be repeated every 5-10 years through age 26, unless early forms of precancerous polyps or small growths are found.  People who are at an increased risk for hepatitis B should be screened for this virus. You are considered at high risk for hepatitis B if:  You were born in a country where hepatitis B occurs often. Talk with your health care provider about which countries are considered high risk.  Your parents were born in a high-risk country and you have not received a shot to protect against hepatitis B (hepatitis B vaccine).  You have HIV or AIDS.  You use needles to inject street  drugs.  You live with, or have sex with, someone who has hepatitis B.  You are a man who has sex with other men (MSM).  You get hemodialysis treatment.  You take certain medicines for conditions like cancer, organ transplantation, and autoimmune conditions.  Hepatitis C blood testing is recommended for all people born from [birth year] through 1964 and any individual with known risk factors for hepatitis C.  Healthy men should no longer receive prostate-specific antigen (PSA) blood tests as part of routine cancer screening. Talk to your health care provider about prostate cancer screening.  Testicular cancer screening is not recommended for adolescents or adult males who have no symptoms. Screening includes self-exam, a health care provider exam, and other screening tests. Consult with your health care provider about any symptoms you have or any concerns you have about testicular cancer.  Practice safe sex.  Use condoms and avoid high-risk sexual practices to reduce the spread of sexually transmitted infections (STIs).  You should be screened for STIs, including gonorrhea and chlamydia if:  You are sexually active and are younger than 24 years.  You are older than 24 years, and your health care provider tells you that you are at risk for this type of infection.  Your sexual activity has changed since you were last screened, and you are at an increased risk for chlamydia or gonorrhea. Ask your health care provider if you are at risk.  If you are at risk of being infected with HIV, it is recommended that you take a prescription medicine daily to prevent HIV infection. This is called pre-exposure prophylaxis (PrEP). You are considered at risk if:  You are a man who has sex with other men (MSM).  You are a heterosexual man who is sexually active with multiple partners.  You take drugs by injection.  You are sexually active with a partner who has HIV.  Talk with your health care provider about  whether you are at high risk of being infected with HIV. If you choose to begin PrEP, you should first be tested for HIV. You should then be tested every 3 months for as long as you are taking PrEP.  Use sunscreen. Apply sunscreen liberally and repeatedly throughout the day. You should seek shade when your shadow is shorter than you. Protect yourself by wearing long sleeves, pants, a wide-brimmed hat, and sunglasses year round whenever you are outdoors.  Tell your health care provider of new moles or changes in moles, especially if there is a change in shape or color. Also, tell your health care provider if a mole is larger than the size of a pencil eraser.  A one-time screening for abdominal aortic aneurysm (AAA) and surgical repair of large AAAs by ultrasound is recommended for men aged 29-75 years who are current or former smokers.  Stay current with your vaccines (immunizations). This information is not intended to replace advice given to you by your health care provider. Make sure you discuss any questions you have with your health care provider. Document Released: 04/22/2008 Document Revised: 11/15/2014 Document Reviewed: 07/29/2015 Elsevier Interactive Patient Education  2017 [employer].

## 2016-11-23 NOTE — Assessment & Plan Note (Signed)
Preventative protocols reviewed and updated unless pt declined. Discussed healthy diet and lifestyle.  

## 2016-11-25 ENCOUNTER — Other Ambulatory Visit: Payer: Self-pay | Admitting: Family Medicine

## 2016-11-25 DIAGNOSIS — R7989 Other specified abnormal findings of blood chemistry: Secondary | ICD-10-CM

## 2016-11-26 ENCOUNTER — Encounter: Payer: Self-pay | Admitting: *Deleted

## 2016-12-09 ENCOUNTER — Other Ambulatory Visit: Payer: Self-pay | Admitting: Family Medicine

## 2017-01-10 ENCOUNTER — Other Ambulatory Visit: Payer: Self-pay | Admitting: Family Medicine

## 2017-01-21 ENCOUNTER — Other Ambulatory Visit: Payer: Self-pay | Admitting: Family Medicine

## 2017-02-09 ENCOUNTER — Telehealth: Payer: Self-pay

## 2017-02-09 MED ORDER — OMEPRAZOLE 40 MG PO CPDR: 40.0000 mg | DELAYED_RELEASE_CAPSULE | Freq: Two times a day (BID) | ORAL | 1 refills | Status: DC

## 2017-02-09 NOTE — Telephone Encounter (Signed)
Patient notified and was very understanding.

## 2017-02-09 NOTE — Telephone Encounter (Signed)
Looks like our mistake - should have sent #180. New dose sent to [facility] for next refill.

## 2017-02-09 NOTE — Telephone Encounter (Signed)
Pt left v/m;pt wants to know why omeprazole 40 mg was changed to take one pill daily and quantity changed to # 90 instead of # 180 that pt had previously gotten.

## 2017-02-10 ENCOUNTER — Encounter: Payer: Self-pay | Admitting: *Deleted

## 2017-02-10 ENCOUNTER — Other Ambulatory Visit (INDEPENDENT_AMBULATORY_CARE_PROVIDER_SITE_OTHER): Payer: Managed Care, Other (non HMO)

## 2017-02-10 ENCOUNTER — Encounter: Payer: Self-pay | Admitting: Family Medicine

## 2017-02-10 ENCOUNTER — Ambulatory Visit (INDEPENDENT_AMBULATORY_CARE_PROVIDER_SITE_OTHER): Payer: Managed Care, Other (non HMO) | Admitting: Family Medicine

## 2017-02-10 VITALS — BP 136/80 | HR 88 | Temp 98.2°F | Wt 249.5 lb

## 2017-02-10 DIAGNOSIS — F17200 Nicotine dependence, unspecified, uncomplicated: Secondary | ICD-10-CM

## 2017-02-10 DIAGNOSIS — R946 Abnormal results of thyroid function studies: Secondary | ICD-10-CM | POA: Diagnosis not present

## 2017-02-10 DIAGNOSIS — E1069 Type 1 diabetes mellitus with other specified complication: Secondary | ICD-10-CM | POA: Diagnosis not present

## 2017-02-10 DIAGNOSIS — K2970 Gastritis, unspecified, without bleeding: Secondary | ICD-10-CM | POA: Diagnosis not present

## 2017-02-10 DIAGNOSIS — R7989 Other specified abnormal findings of blood chemistry: Secondary | ICD-10-CM

## 2017-02-10 LAB — TSH: TSH: 0.2 u[IU]/mL — ABNORMAL LOW (ref 0.35–4.50)

## 2017-02-10 LAB — T4, FREE: Free T4: 0.99 ng/dL (ref 0.60–1.60)

## 2017-02-10 MED ORDER — ONDANSETRON HCL 4 MG PO TABS: 4.0000 mg | ORAL_TABLET | Freq: Three times a day (TID) | ORAL | 0 refills | Status: DC | PRN

## 2017-02-10 NOTE — Assessment & Plan Note (Signed)
Continue to encourage cessation. Slow action phase - down to 1 ppd.

## 2017-02-10 NOTE — Assessment & Plan Note (Signed)
Possible gastroparesis contribution. He is on reglan for this.

## 2017-02-10 NOTE — Assessment & Plan Note (Addendum)
Anticipate viral gastritis possibly contracted from granddaughter.  Supportive care reviewed. Update if not improving as expected. zofran PRN nausea.  rec hold aspirin, decrease enalapril to 1 daily until vomiting resolved.

## 2017-02-10 NOTE — Progress Notes (Signed)
BP 136/80   Pulse 88   Temp 98.2 F (36.8 C) (Oral)   Wt 249 lb 8 oz (113.2 kg)   SpO2 98%   BMI 31.19 kg/m    CC: vomiting with chills Subjective:    Patient ID: Russell Gomez, male    DOB: [birth year], 56 y.o.   MRN: [id number]  HPI: Russell Gomez is a 56 y.o. male presenting on 02/10/2017 for Emesis (along with chills and body aches)   Episode of vomiting, chills for 30 min last night. Some body aches. Tmax 100.3 last night. No abd pain, diarrhea. No respiratory symptoms. Denies urinary symptoms of dysuria, urgency, frequency. Last BM last yesterday, normal.   Granddaughter sick last week with GI illness.  Out of work today.   Lab Results  Component Value Date   HGBA1C 6.0 11/23/2016  fasting cbg 183, treated with 3 u insulin.  He does take reglan 10mg  BID PRN - takes about one a day.  He does take omeprazole 40mg  BID.   Relevant past medical, surgical, family and social history reviewed and updated as indicated. Interim medical history since our last visit reviewed. Allergies and medications reviewed and updated. Outpatient Medications Prior to Visit  Medication Sig Dispense Refill  . amLODipine (NORVASC) 5 MG tablet Take 1 tablet (5 mg total) by mouth daily. 90 tablet 3  . aspirin (ASPIRIN EC) 81 MG EC tablet Take 81 mg by mouth daily. Swallow whole.    . enalapril (VASOTEC) 20 MG tablet TAKE 1 TABLET BY MOUTH TWICE DAILY 180 tablet 2  . furosemide (LASIX) 20 MG tablet TAKE ONE-HALF TO ONE TABLET BY MOUTH ONCE DAILY AS NEEDED FOR  EDEMA 30 tablet 3  . insulin glargine (LANTUS) 100 UNIT/ML injection Inject 18 Units into the skin daily with breakfast.     . insulin regular (HUMULIN R,NOVOLIN R) 100 UNIT/ML injection Inject into the skin as directed. Sliding scale (5- 8 units with meals)    . metoCLOPramide (REGLAN) 10 MG tablet TAKE 1 TABLET BY MOUTH TWICE DAILY AS NEEDED 180 tablet 1  . metoprolol tartrate (LOPRESSOR) 25 MG tablet TAKE ONE TABLET BY MOUTH TWICE DAILY 60  tablet 11  . omeprazole (PRILOSEC) 40 MG capsule Take 1 capsule (40 mg total) by mouth 2 (two) times daily. 180 capsule 1  . pravastatin (PRAVACHOL) 80 MG tablet TAKE 1 TABLET BY MOUTH ONE TIME DAILY 90 tablet 0   No facility-administered medications prior to visit.      Per HPI unless specifically indicated in ROS section below Review of Systems     Objective:    BP 136/80   Pulse 88   Temp 98.2 F (36.8 C) (Oral)   Wt 249 lb 8 oz (113.2 kg)   SpO2 98%   BMI 31.19 kg/m   Wt Readings from Last 3 Encounters:  02/10/17 249 lb 8 oz (113.2 kg)  11/23/16 251 lb (113.9 kg)  06/02/16 249 lb 8 oz (113.2 kg)    Physical Exam  Constitutional: He appears well-developed and well-nourished. No distress.  HENT:  Mouth/Throat: Oropharynx is clear and moist. No oropharyngeal exudate.  Eyes: Conjunctivae are normal. Pupils are equal, round, and reactive to light.  Neck: Normal range of motion. Neck supple. Thyromegaly present.  Cardiovascular: Normal rate, regular rhythm, normal heart sounds and intact distal pulses.   No murmur heard. Pulmonary/Chest: Effort normal and breath sounds normal. No respiratory distress. He has no wheezes. He has no rales.  coarse  Abdominal: Soft. Normal appearance and bowel sounds are normal. He exhibits no distension and no mass. There is no hepatosplenomegaly. There is no tenderness. There is no rigidity, no rebound, no guarding, no CVA tenderness and negative Murphy's sign.  Musculoskeletal: He exhibits no edema.  Lymphadenopathy:    He has no cervical adenopathy.  Skin: Skin is warm and dry. No rash noted.  Nursing note and vitals reviewed.     Assessment & Plan:   Problem List Items Addressed This Visit    Diabetes mellitus type 1, controlled ([place])    Possible gastroparesis contribution. He is on reglan for this.       TOBACCO ABUSE    Continue to encourage cessation. Slow action phase - down to 1 ppd.       Viral gastritis - Primary     Anticipate viral gastritis possibly contracted from granddaughter.  Supportive care reviewed. Update if not improving as expected. zofran PRN nausea.  rec hold aspirin, decrease enalapril to 1 daily until vomiting resolved.           Follow up plan: Return if symptoms worsen or fail to improve.  Ria Bush, MD

## 2017-02-10 NOTE — Patient Instructions (Signed)
I think you do have viral gastritis. Push fluids and rest.  Out of work until feeling better.  Keep an eye on sugars.  zofran for nausea as needed.

## 2017-02-10 NOTE — Progress Notes (Signed)
Pre visit review using our clinic review tool, if applicable. No additional management support is needed unless otherwise documented below in the visit note. 

## 2017-02-11 LAB — T3: T3 TOTAL: 132 ng/dL (ref 76–181)

## 2017-02-13 ENCOUNTER — Encounter: Payer: Self-pay | Admitting: Family Medicine

## 2017-02-13 DIAGNOSIS — R946 Abnormal results of thyroid function studies: Secondary | ICD-10-CM | POA: Insufficient documentation

## 2017-03-24 ENCOUNTER — Other Ambulatory Visit: Payer: Self-pay | Admitting: Family Medicine

## 2017-04-01 ENCOUNTER — Telehealth: Payer: Self-pay

## 2017-04-01 ENCOUNTER — Telehealth: Payer: Self-pay | Admitting: Family Medicine

## 2017-04-01 NOTE — Telephone Encounter (Signed)
Vaughan Basta RN with [facility] said pt is out of town until late 04/02/17.  Pt having feet swelling, skin feels very tight. swelling goes up to calves of legs. Pt states feet swollen 1  1/2 times their normal size. Pt wants to know if can increase furosemide 20 mg; presently taking one daily prn swelling or can med be changed. Pt request [facility]. [facility] [address].

## 2017-04-01 NOTE — Telephone Encounter (Signed)
Patient Name: Russell Gomez  DOB: 1960/12/29    Initial Comment Caller states that he is on a fluid pill once a day and his feet and ankle are still swollen all day.    Nurse Assessment  Nurse: Lavera Guise RN, Vaughan Basta Date/Time (Eastern Time): 04/01/2017 4:37:17 PM  Confirm and document reason for call. If symptomatic, describe symptoms. ---Caller states that he is on a fluid pill once a day and his feet and ankle are still swollen all day. Swollen all the time now. No other symptoms. Swollen 1.5 sizes to big. Started some time ago. Whole foot and ankle. Goes up to where the calf starts. Can feel fluid when skin is pushed. Skin is real tight. No pain. Nothing looks red and infected. No fever. No history of heart failure, kidney disease, liver failure, or cancer. Has had before. Used to take HCTZ but started breaking out and had skin itching. So then stopped and started Lasix. No chest pain, difficulty breathing.  Does the patient have any new or worsening symptoms? ---Yes  Will a triage be completed? ---Yes  Related visit to physician within the last 2 weeks? ---Yes  Does the PT have any chronic conditions? (i.e. diabetes, asthma, etc.) ---Yes  List chronic conditions. ---diabetes  Is this a behavioral health or substance abuse call? ---No     Guidelines    Guideline Title Affirmed Question Affirmed Notes  Leg Swelling and Edema [1] Thigh, calf, or ankle swelling AND [2] bilateral AND [3] 1 side is more swollen    Final Disposition User   See Physician within 4 Hours (or PCP triage) Lavera Guise, RN, Vaughan Basta    Comments  Patient is on vacation and will be returning home tomorrow night. States he does not want to see an out of town Dr. This nurse stressed the importance of being evaluated to assure he is safe, due to feet looking normal this a.m. and now so swollen, with one foot looking more swollen than the other.   Referrals  GO TO FACILITY UNDECIDED   Disagree/Comply: Comply

## 2017-04-01 NOTE — Telephone Encounter (Signed)
Increase to 2 lasix tab (40mg ) daily over weekend, f/u in office next week.

## 2017-04-01 NOTE — Telephone Encounter (Signed)
Patient advised.

## 2017-04-05 NOTE — Telephone Encounter (Signed)
See 04/01/17 phone note; appears to have been addressed.

## 2017-04-08 ENCOUNTER — Encounter: Payer: Self-pay | Admitting: Family Medicine

## 2017-04-08 ENCOUNTER — Encounter (INDEPENDENT_AMBULATORY_CARE_PROVIDER_SITE_OTHER): Payer: Self-pay

## 2017-04-08 ENCOUNTER — Ambulatory Visit (INDEPENDENT_AMBULATORY_CARE_PROVIDER_SITE_OTHER): Payer: Managed Care, Other (non HMO) | Admitting: Family Medicine

## 2017-04-08 VITALS — BP 124/78 | HR 95 | Temp 97.7°F | Wt 256.5 lb

## 2017-04-08 DIAGNOSIS — R946 Abnormal results of thyroid function studies: Secondary | ICD-10-CM

## 2017-04-08 DIAGNOSIS — R600 Localized edema: Secondary | ICD-10-CM

## 2017-04-08 DIAGNOSIS — I10 Essential (primary) hypertension: Secondary | ICD-10-CM

## 2017-04-08 LAB — BASIC METABOLIC PANEL
BUN: 21 mg/dL (ref 6–23)
CO2: 30 meq/L (ref 19–32)
Calcium: 9.1 mg/dL (ref 8.4–10.5)
Chloride: 102 mEq/L (ref 96–112)
Creatinine, Ser: 0.93 mg/dL (ref 0.40–1.50)
GFR: 89.81 mL/min (ref >60.00)
GLUCOSE: 151 mg/dL — AB (ref 70–99)
POTASSIUM: 3.4 meq/L — AB (ref 3.5–5.1)
SODIUM: 139 meq/L (ref 135–145)

## 2017-04-08 LAB — T4, FREE: Free T4: 0.85 ng/dL (ref 0.60–1.60)

## 2017-04-08 LAB — TSH: TSH: 0.17 u[IU]/mL — ABNORMAL LOW (ref 0.35–4.50)

## 2017-04-08 NOTE — Patient Instructions (Signed)
Continue lasix twice daily for the next 5 days. Labs today.  We will be in touch with results.

## 2017-04-08 NOTE — Assessment & Plan Note (Signed)
H/o subclinical hyperthyroidism - will update TFTs as well as TSH receptor Ab to further evaluate for graves disease. No ophthalmopathy apparent today.

## 2017-04-08 NOTE — Assessment & Plan Note (Signed)
New - suspicious for pretibial myxedema in setting of previously abnormal thyroid functions. See below.  For now, continue lasix 20mg  bid (discussed timing around his third shift schedule) for 5 days then return to once daily.

## 2017-04-08 NOTE — Progress Notes (Addendum)
BP 124/78   Pulse 95   Temp 97.7 F (36.5 C) (Oral)   Wt 256 lb 8 oz (116.3 kg)   SpO2 97%   BMI 32.06 kg/m    CC: pedal edema Subjective:    Patient ID: Russell Gomez, male    DOB: [birth year], 56 y.o.   MRN: [id number]  HPI: Russell Gomez is a 56 y.o. male presenting on 04/08/2017 for Edema (legs bilateral x 1week)   See recent phone note for details.  Progressive pedal edema noted by patient over the last 1-2 weeks. Notes swelling mid shin down to toes. We increased lasix to 40mg  daily over weekend with good improvement. He does endorse good effect with 20mg  lasix. He works third shift.   He thinks he previously had overactive thyroid, was on medication for this but endocrinologist in [facility] had him stop medication.   Denies chest pain, dyspnea, cough, abd pain, nausea. Never leg pain. No eye pain or throat pain.  Father with thyroid disease. Known T1DM followed by endocrinology.   Relevant past medical, surgical, family and social history reviewed and updated as indicated. Interim medical history since our last visit reviewed. Allergies and medications reviewed and updated. Outpatient Medications Prior to Visit  Medication Sig Dispense Refill  . amLODipine (NORVASC) 5 MG tablet Take 1 tablet (5 mg total) by mouth daily. 90 tablet 3  . aspirin (ASPIRIN EC) 81 MG EC tablet Take 81 mg by mouth daily. Swallow whole.    . enalapril (VASOTEC) 20 MG tablet TAKE 1 TABLET BY MOUTH TWICE DAILY 180 tablet 2  . furosemide (LASIX) 20 MG tablet TAKE 1/2 TO  1 TABLET BY MOUTH ONCE DAILY AS NEEDED FOR EDEMA 30 tablet 3  . insulin glargine (LANTUS) 100 UNIT/ML injection Inject 18 Units into the skin daily with breakfast.     . insulin regular (HUMULIN R,NOVOLIN R) 100 UNIT/ML injection Inject into the skin as directed. Sliding scale (5- 8 units with meals)    . metoCLOPramide (REGLAN) 10 MG tablet TAKE 1 TABLET BY MOUTH TWICE DAILY AS NEEDED 180 tablet 1  . metoprolol tartrate  (LOPRESSOR) 25 MG tablet TAKE ONE TABLET BY MOUTH TWICE DAILY 60 tablet 11  . omeprazole (PRILOSEC) 40 MG capsule Take 1 capsule (40 mg total) by mouth 2 (two) times daily. 180 capsule 1  . ondansetron (ZOFRAN) 4 MG tablet Take 1 tablet (4 mg total) by mouth every 8 (eight) hours as needed for nausea or vomiting. 20 tablet 0  . pravastatin (PRAVACHOL) 80 MG tablet TAKE 1 TABLET BY MOUTH ONE TIME DAILY 90 tablet 0   No facility-administered medications prior to visit.      Per HPI unless specifically indicated in ROS section below Review of Systems     Objective:    BP 124/78   Pulse 95   Temp 97.7 F (36.5 C) (Oral)   Wt 256 lb 8 oz (116.3 kg)   SpO2 97%   BMI 32.06 kg/m   Wt Readings from Last 3 Encounters:  04/08/17 256 lb 8 oz (116.3 kg)  02/10/17 249 lb 8 oz (113.2 kg)  11/23/16 251 lb (113.9 kg)    Physical Exam  Constitutional: He appears well-developed and well-nourished. No distress.  Eyes: Conjunctivae are normal. Pupils are equal, round, and reactive to light. No scleral icterus.  No exophthalmos  Neck: Thyromegaly (diffuse) present.  Cardiovascular: Normal rate, regular rhythm, normal heart sounds and intact distal pulses.   No murmur  heard. Pulmonary/Chest: Effort normal and breath sounds normal. No respiratory distress. He has no wheezes. He has no rales.  Musculoskeletal: He exhibits edema (tr pitting edema predominantly anterior shins).  2+ DP bilaterally  Nursing note and vitals reviewed.  Results for orders placed or performed in visit on 02/10/17  TSH  Result Value Ref Range   TSH 0.20 (L) 0.35 - 4.50 uIU/mL  T3  Result Value Ref Range   T3, Total 132.0 76 - 181 ng/dL  T4, free  Result Value Ref Range   Free T4 0.99 0.60 - 1.60 ng/dL      Assessment & Plan:   Problem List Items Addressed This Visit    Abnormal thyroid function test    H/o subclinical hyperthyroidism - will update TFTs as well as TSH receptor Ab to further evaluate for graves  disease. No ophthalmopathy apparent today.      Relevant Orders   T3   T4, free   TSH   Thyrotropin receptor autoabs   Essential hypertension   Relevant Orders   Basic metabolic panel   Pedal edema - Primary    New - suspicious for pretibial myxedema in setting of previously abnormal thyroid functions. See below.  For now, continue lasix 20mg  bid (discussed timing around his third shift schedule) for 5 days then return to once daily.       Relevant Orders   TSH   Basic metabolic panel       Follow up plan: No Follow-up on file.  Ria Bush, MD

## 2017-04-09 LAB — T3: T3, Total: 113 ng/dL (ref 76–181)

## 2017-04-12 LAB — THYROTROPIN RECEPTOR AUTOABS: Thyrotropin Receptor Ab: <6 % (ref <=16.0)

## 2017-04-14 ENCOUNTER — Telehealth: Payer: Self-pay | Admitting: Family Medicine

## 2017-04-14 NOTE — Telephone Encounter (Signed)
Patient returned call about his lab results.  Patient works third shift.  He asked to be called back before 2:30.  Patient said if he doesn't answer, leave a detailed message on his voice mail.

## 2017-04-15 NOTE — Telephone Encounter (Signed)
Results given to pt by melanie per lab results notes.

## 2017-04-18 ENCOUNTER — Other Ambulatory Visit: Payer: Self-pay | Admitting: Family Medicine

## 2017-07-12 ENCOUNTER — Telehealth: Payer: Self-pay

## 2017-07-12 NOTE — Telephone Encounter (Signed)
I spoke with pts wife and pt had taken benadryl and applied ice to areas stung by bee; no difficulty breathing and no throat or tongue swelling; pt is at work at this time. Pt still hurts at spots where stung; 2 of them look like has pimple where stung, no drainage. Cecille Rubin said pt is doing OK today and will continue benadryl and cool compresses if needed; if pt still having problem in AM pt will call at 8 AM for appt. FYI to Dr Darnell Level.

## 2017-07-12 NOTE — Telephone Encounter (Signed)
PLEASE NOTE: All timestamps contained within this report are represented as [place] Standard Time. CONFIDENTIALTY NOTICE: This fax transmission is intended only for the addressee. It contains information that is legally privileged, confidential or otherwise protected from use or disclosure. If you are not the intended recipient, you are strictly prohibited from reviewing, disclosing, copying using or disseminating any of this information or taking any action in reliance on or regarding this information. If you have received this fax in error, please notify us immediately by telephone so that we can arrange for its return to [place]. Phone: [phone], Toll-Free: [phone], Fax: [phone] Page: 1 of 2 Call Id: [id number] [place] Patient Name: Russell Gomez Gender: Male DOB: 10-Sep-1961 Age: 56 Y 9 M 15 D Return Phone Number: [phone] (Primary), [phone] (Secondary) City/State/Zip: [place] [address] Client [facility] - Client Client Site [place] - Day Physician Ria Bush - MD Who Is Calling Patient / Member / Family / Caregiver Call Type Triage / Clinical Relationship To Patient Self Return Phone Number [phone] (Primary) Chief Complaint Bee, Wasp, or Yellow Jacket Sting (no allergic symptoms) Reason for Call Symptomatic / Request for Health Information Initial Comment caller ran over yellow jackets nest yesterday while mowing the yards . He states that two of the stings have a "white " pimple in the middle . He is not allergic but today he does not fell well . States he has had a decrease in appitite Appointment Disposition EMR Appointment Not Necessary Info pasted into Epic Yes Nurse Assessment Nurse: Raphael Gibney, RN, Vanita Ingles Date/Time (Eastern Time): 07/12/2017 1:47:20 PM Confirm and document reason for call. If symptomatic, describe  symptoms. ---Caller states he was mowing yesterday. He was stung about 6 times by yellow jackets. he was stung 2 times on his kneecap and back of right calf and 1 sting on his pinky and 1 sting on his rib cage. 3 of the bumps have a white head in the middle. Not swollen. Bites are a little red. Does the PT have any chronic conditions? (i.e. diabetes, asthma, etc.) ---Yes List chronic conditions. ---diabetes Guidelines Guideline Title Affirmed Question Bee or Yellow Jacket Sting Normal local reaction to bee, wasp, or yellow jacket sting Disp. Time Eilene Ghazi Time) Disposition Final User 07/12/2017 1:54:21 PM Home Care Yes Raphael Gibney, RN, [place] Advice Given Per Guideline HOME CARE: You should be able to treat this at home. REASSURANCE AND EDUCATION: * It sounds like a routine bee (wasp, yellow jacket) sting that we can treat at home. * The main symptoms are localized pain, swelling, itching, and mild redness at the sting site. REMOVE STINGER (if present): * The stinger looks like as a tiny black dot in the center of the sting. * There are several different methods of removal. Removing the stinger quickly is more important than the type of removal used. * The patient can grab it with his fingers, scrape it out with a credit card or finger nail, or use scotch tape. If only a small fragment remains, don't worry about it. It will shed with the skin. * In many cases no stinger will be present. Only bees leave their stingers. Wasps, yellow jackets, and hornets do not. OTC ANTIHISTAMINE FOR ITCHING: * If the sting becomes itchy, take a dose of Benadryl (OTC PLEASE NOTE: All timestamps contained within this report are represented as [place] Standard Time. CONFIDENTIALTY NOTICE: This fax  transmission is intended only for the addressee. It contains information that is legally privileged, confidential or otherwise protected from use or disclosure. If you are not the intended recipient, you are strictly  prohibited from reviewing, disclosing, copying using or disseminating any of this information or taking any action in reliance on or regarding this information. If you have received this fax in error, please notify us immediately by telephone so that we can arrange for its return to [place]. Phone: [phone], Toll-Free: [phone], Fax: [phone] Page: 2 of 2 Call Id: [id number] Care Advice Given Per Guideline diphenhydramine). * Swelling becomes huge CALL BACK IF: * Sting begins to look infected. APPLY LOCAL COLD - COLD PACK METHOD: * Wrap a bag of ice in a towel. (or a bag of frozen vegetables, like peas) Comments User: Dannielle Burn, RN Date/Time Eilene Ghazi Time): 07/12/2017 1:18:51 PM called primary number and left message. Will try secondary number User: Dannielle Burn, RN Date/Time Eilene Ghazi Time): 07/12/2017 1:22:42 PM Called secondary number and left message. Will try primary number again in a few min

## 2017-07-16 ENCOUNTER — Other Ambulatory Visit: Payer: Self-pay | Admitting: Family Medicine

## 2017-08-08 LAB — HM DIABETES FOOT EXAM

## 2017-09-03 ENCOUNTER — Other Ambulatory Visit: Payer: Self-pay | Admitting: Family Medicine

## 2017-10-03 ENCOUNTER — Other Ambulatory Visit: Payer: Self-pay | Admitting: Family Medicine

## 2017-10-17 ENCOUNTER — Other Ambulatory Visit: Payer: Self-pay | Admitting: Family Medicine

## 2017-10-19 ENCOUNTER — Telehealth: Payer: Self-pay | Admitting: Family Medicine

## 2017-10-19 MED ORDER — FUROSEMIDE 20 MG PO TABS: ORAL_TABLET | ORAL | 3 refills | Status: DC

## 2017-10-19 NOTE — Telephone Encounter (Signed)
Copied from [place] #[id number]. Topic: Quick Communication - See Telephone Encounter >> Oct 19, 2017 12:41 PM Ivar Drape wrote: CRM for notification. See Telephone encounter for:  10/19/17. Patient stated he has been waiting for his Furofemide (Lasix) medication for a while.  The [facility] Pharmacist on [address] said they sent a request to the PCP and they are still waiting for a response.

## 2017-10-23 ENCOUNTER — Other Ambulatory Visit: Payer: Self-pay | Admitting: Family Medicine

## 2017-11-24 ENCOUNTER — Encounter: Payer: Self-pay | Admitting: Emergency Medicine

## 2017-11-24 ENCOUNTER — Emergency Department
Admission: EM | Admit: 2017-11-24 | Discharge: 2017-11-24 | Disposition: A | Discharge status: Home / Self Care | Payer: Managed Care, Other (non HMO) | Attending: Emergency Medicine | Admitting: Emergency Medicine

## 2017-11-24 ENCOUNTER — Other Ambulatory Visit: Payer: Self-pay

## 2017-11-24 DIAGNOSIS — Z79899 Other long term (current) drug therapy: Secondary | ICD-10-CM | POA: Insufficient documentation

## 2017-11-24 DIAGNOSIS — Z23 Encounter for immunization: Secondary | ICD-10-CM | POA: Diagnosis not present

## 2017-11-24 DIAGNOSIS — W269XXA Contact with unspecified sharp object(s), initial encounter: Secondary | ICD-10-CM | POA: Diagnosis not present

## 2017-11-24 DIAGNOSIS — Y929 Unspecified place or not applicable: Secondary | ICD-10-CM | POA: Diagnosis not present

## 2017-11-24 DIAGNOSIS — Z794 Long term (current) use of insulin: Secondary | ICD-10-CM | POA: Insufficient documentation

## 2017-11-24 DIAGNOSIS — Y9389 Activity, other specified: Secondary | ICD-10-CM | POA: Insufficient documentation

## 2017-11-24 DIAGNOSIS — S61217A Laceration without foreign body of left little finger without damage to nail, initial encounter: Secondary | ICD-10-CM

## 2017-11-24 DIAGNOSIS — Y998 Other external cause status: Secondary | ICD-10-CM | POA: Insufficient documentation

## 2017-11-24 DIAGNOSIS — E109 Type 1 diabetes mellitus without complications: Secondary | ICD-10-CM | POA: Insufficient documentation

## 2017-11-24 DIAGNOSIS — Z7982 Long term (current) use of aspirin: Secondary | ICD-10-CM | POA: Diagnosis not present

## 2017-11-24 DIAGNOSIS — I10 Essential (primary) hypertension: Secondary | ICD-10-CM | POA: Insufficient documentation

## 2017-11-24 DIAGNOSIS — S6992XA Unspecified injury of left wrist, hand and finger(s), initial encounter: Secondary | ICD-10-CM | POA: Diagnosis present

## 2017-11-24 DIAGNOSIS — F17210 Nicotine dependence, cigarettes, uncomplicated: Secondary | ICD-10-CM | POA: Diagnosis not present

## 2017-11-24 MED ORDER — BACITRACIN ZINC 500 UNIT/GM EX OINT
TOPICAL_OINTMENT | Freq: Two times a day (BID) | CUTANEOUS | Status: DC
Start: 2017-11-24 — End: 2017-11-25
  Administered 2017-11-24: 1 via TOPICAL
  Filled 2017-11-24: qty 0.9

## 2017-11-24 MED ORDER — TETANUS-DIPHTH-ACELL PERTUSSIS 5-2.5-18.5 LF-MCG/0.5 IM SUSP
0.5000 mL | Freq: Once | INTRAMUSCULAR | Status: AC
  Administered 2017-11-24: 0.5 mL via INTRAMUSCULAR
  Filled 2017-11-24: qty 0.5

## 2017-11-24 MED ORDER — CEPHALEXIN 500 MG PO CAPS: 500.0000 mg | ORAL_CAPSULE | Freq: Four times a day (QID) | ORAL | 0 refills | Status: AC

## 2017-11-24 NOTE — ED Triage Notes (Addendum)
Patient ambulatory to triage with steady gait, without difficulty or distress noted; pt reports PTA injured finger while working on car; avulsion noted to left 5th digit with active bleeding; dressing applied; pt unsure of last etanus

## 2017-11-24 NOTE — ED Provider Notes (Signed)
[facility] Emergency Department Provider Note  ____________________________________________  Time seen: Approximately 9:08 PM  I have reviewed the triage vital signs and the nursing notes.   HISTORY  Chief Complaint Laceration    HPI Russell Gomez is a 57 y.o. male presents emergency department for evaluation of finger injury.  Patient was working on a car when he cut the tip of his finger.  He is unsure of last tetanus shot.  No additional injuries or concerns.   Past Medical History:  Diagnosis Date  . Arthritis    on operative finger  . Dental crowns present   . Diabetes type 1, uncontrolled (HCC)    IDDM (Dr. Eddie Dibbles @ [facility])  . Esophageal reflux   . Goiter 04/2012   no current med, has yearly monitoring  . High cholesterol   . HTN (hypertension)    under control with meds, has been on med x 2 yr.    Patient Active Problem List   Diagnosis Date Noted  . Pedal edema 04/08/2017  . Abnormal thyroid function test 02/13/2017  . Health maintenance examination 01/23/2014  . Colloid thyroid nodule 05/05/2012  . Erectile dysfunction 04/19/2012  . Diabetes mellitus type 1, controlled ([facility]) 10/22/2010  . HLD (hyperlipidemia) 10/22/2010  . TOBACCO ABUSE 10/22/2010  . Essential hypertension 10/22/2010  . GERD 10/22/2010    Past Surgical History:  Procedure Laterality Date  . DISTAL INTERPHALANGEAL JOINT FUSION Right 03/05/2014   Procedure: DEBRIDEMENT (DIP) DISTAL INTERPHALANGEAL RIGHT MIDDLE FINGER;  Surgeon: Wynonia Sours, MD;  Location: [facility];  Service: Orthopedics;  Laterality: Right;  . KNEE ARTHROSCOPY    . MASS EXCISION Right 03/05/2014   Procedure: EXCISION CYST ;  Surgeon: Wynonia Sours, MD;  Location: [facility];  Service: Orthopedics;  Laterality: Right;  ANESTHESIA: IV REGINAL FAB  . OPEN REDUCTION NASAL FRACTURE  12/26/2008   with closure of nasal lac.  Marland Kitchen ORIF DISTAL RADIUS FRACTURE Right 12/26/2008  . ORIF  WRIST FRACTURE Left 05/12/2016   takedown of nonunion/malunion and OPEN REDUCTION INTERNAL FIXATION (ORIF) WRIST FRACTURE;  Surgeon: Corky Mull, MD  . PERCUTANEOUS PINNING Left 02/06/2013   Procedure: PINNING PIP OF THE LEFT MIDDLE FINGER ;  Surgeon: Wynonia Sours, MD;  Location: [facility];  Service: Orthopedics;  Laterality: Left;  . SKIN GRAFT SPLIT THICKNESS LEG / FOOT Right 1985   thigh after trauma vs machine at work  . TRIGGER FINGER RELEASE  11/28/2012   Procedure: RELEASE TRIGGER FINGER/A-1 PULLEY;  Surgeon: Wynonia Sours, MD;  Laterality: Left;  EXCISION MASS LEFT RING FINGER, RELEASE A-1 PULLEY LEFT RING FINGER (ganglion cyst)    Prior to Admission medications   Medication Sig Start Date End Date Taking? Authorizing Provider  amLODipine (NORVASC) 5 MG tablet TAKE 1 TABLET BY MOUTH DAILY 10/03/17   Ria Bush, MD  aspirin (ASPIRIN EC) 81 MG EC tablet Take 81 mg by mouth daily. Swallow whole.    [provider]  cephALEXin (KEFLEX) 500 MG capsule Take 1 capsule (500 mg total) by mouth 4 (four) times daily for 10 days. 11/24/17 12/04/17  Laban Emperor, PA-C  enalapril (VASOTEC) 20 MG tablet TAKE 1 TABLET BY MOUTH TWICE DAILY 10/24/17   Ria Bush, MD  furosemide (LASIX) 20 MG tablet TAKE 1/2 TO 1 (ONE-HALF TO ONE) TABLET BY MOUTH ONCE DAILY AS NEEDED FOR EDEMA 10/19/17   Ria Bush, MD  insulin glargine (LANTUS) 100 UNIT/ML injection Inject  18 Units into the skin daily with breakfast.     [provider]  insulin regular (HUMULIN R,NOVOLIN R) 100 UNIT/ML injection Inject into the skin as directed. Sliding scale (5- 8 units with meals)    [provider]  metoCLOPramide (REGLAN) 10 MG tablet TAKE 1 TABLET BY MOUTH TWICE DAILY AS NEEDED 06/08/16   Ria Bush, MD  metoprolol tartrate (LOPRESSOR) 25 MG tablet TAKE ONE TABLET BY MOUTH TWICE DAILY 12/09/16   Ria Bush, MD  omeprazole (PRILOSEC) 40 MG capsule TAKE 1 CAPSULE BY  MOUTH TWICE DAILY 09/05/17   Ria Bush, MD  ondansetron (ZOFRAN) 4 MG tablet Take 1 tablet (4 mg total) by mouth every 8 (eight) hours as needed for nausea or vomiting. 02/10/17   Ria Bush, MD  pravastatin (PRAVACHOL) 80 MG tablet TAKE 1 TABLET BY MOUTH DAILY 10/19/17   Ria Bush, MD    Allergies Insulin aspart; Spironolactone; Hydrochlorothiazide; and Morphine  Family History  Problem Relation Age of Onset  . Healthy Father   . Hypertension Mother   . Hyperlipidemia Mother   . Diabetes Maternal Uncle   . Coronary artery disease Maternal Uncle   . Stroke Maternal Uncle   . Diabetes Paternal Grandmother   . Diabetes Paternal Uncle     Social History Social History   Tobacco Use  . Smoking status: Current Every Day Smoker    Packs/day: 1.00    Years: 20.00    Pack years: 20.00    Types: Cigarettes  . Smokeless tobacco: Never Used  . Tobacco comment: smokes 1 pack a day  Substance Use Topics  . Alcohol use: Yes    Alcohol/week: 4.2 oz    Types: 7 Cans of beer per week    Comment:    . Drug use: No     Review of Systems  Constitutional: No fever/chills Cardiovascular: No chest pain. Respiratory: No SOB. Gastrointestinal: No abdominal pain.  No nausea, no vomiting.  Musculoskeletal: Positive for finger pain. Skin: Negative for rash, ecchymosis.   ____________________________________________   PHYSICAL EXAM:  VITAL SIGNS: ED Triage Vitals  Enc Vitals Group     BP --      Pulse Rate 11/24/17 2022 (!) 108     Resp 11/24/17 2022 18     Temp 11/24/17 2022 98.1 F (36.7 C)     Temp src --      SpO2 11/24/17 2022 97 %     Weight 11/24/17 2021 250 lb (113.4 kg)     Height 11/24/17 2021 6\' 2"  (1.88 m)     Head Circumference --      Peak Flow --      Pain Score 11/24/17 2021 4     Pain Loc --      Pain Edu? --      Excl. in [place]? --      Constitutional: Alert and oriented. Well appearing and in no acute distress. Eyes: Conjunctivae are  normal. PERRL. EOMI. Head: Atraumatic. ENT:      Ears:      Nose: No congestion/rhinnorhea.      Mouth/Throat: Mucous membranes are moist.  Neck: No stridor.   Cardiovascular: Good peripheral circulation. Respiratory: Normal respiratory effort without tachypnea or retractions.  Musculoskeletal: Full range of motion to all extremities. No gross deformities appreciated. Neurologic:  Normal speech and language. No gross focal neurologic deficits are appreciated.  Skin:  Skin is warm, dry and intact.  1/4 cm by 1/4 cm shave laceration to  tip of left little finger.  No exposed tendon, muscle, bone.  No nail or nailbed injury.   ____________________________________________   LABS (all labs ordered are listed, but only abnormal results are displayed)  Labs Reviewed - No data to display ____________________________________________  EKG   ____________________________________________  RADIOLOGY   No results found.  ____________________________________________    PROCEDURES  Procedure(s) performed:    Procedures    Medications  bacitracin ointment (1 application Topical Given 11/24/17 2132)  Tdap (BOOSTRIX) injection 0.5 mL (0.5 mLs Intramuscular Given 11/24/17 2133)     ____________________________________________   INITIAL IMPRESSION / ASSESSMENT AND PLAN / ED COURSE  Pertinent labs & imaging results that were available during my care of the patient were reviewed by me and considered in my medical decision making (see chart for details).  Review of the [place] CSRS was performed in accordance of the [place] prior to dispensing any controlled drugs.   Patient's diagnosis is consistent with shave laceration.  Vital signs and exam are reassuring.  Surgicel was applied to tip of finger.  Finger was bandaged and splint was applied.  Tetanus shot was updated.  Patient will be discharged home with prescriptions for Keflex. Patient is to follow up with PCP as directed. Patient is  given ED precautions to return to the ED for any worsening or new symptoms.     ____________________________________________  FINAL CLINICAL IMPRESSION(S) / ED DIAGNOSES  Final diagnoses:  Laceration of left little finger without foreign body without damage to nail, initial encounter      NEW MEDICATIONS STARTED DURING THIS VISIT:  ED Discharge Orders        Ordered    cephALEXin (KEFLEX) 500 MG capsule  4 times daily     11/24/17 2146          This chart was dictated using voice recognition software/Dragon. Despite best efforts to proofread, errors can occur which can change the meaning. Any change was purely unintentional.    Laban Emperor, PA-C 11/24/17 2253    Eula Listen, MD 11/24/17 2258

## 2017-12-01 ENCOUNTER — Encounter: Payer: Self-pay | Admitting: Family Medicine

## 2017-12-01 ENCOUNTER — Ambulatory Visit: Payer: Managed Care, Other (non HMO) | Admitting: Family Medicine

## 2017-12-01 VITALS — BP 130/70 | HR 81 | Temp 97.9°F | Wt 249.0 lb

## 2017-12-01 DIAGNOSIS — F17200 Nicotine dependence, unspecified, uncomplicated: Secondary | ICD-10-CM | POA: Diagnosis not present

## 2017-12-01 DIAGNOSIS — S61219A Laceration without foreign body of unspecified finger without damage to nail, initial encounter: Secondary | ICD-10-CM | POA: Insufficient documentation

## 2017-12-01 DIAGNOSIS — E1069 Type 1 diabetes mellitus with other specified complication: Secondary | ICD-10-CM | POA: Diagnosis not present

## 2017-12-01 NOTE — Assessment & Plan Note (Signed)
I have no records from endo. I have asked him to sign ROI to request records from [facility].

## 2017-12-01 NOTE — Assessment & Plan Note (Signed)
Healing well. Dressed again today. Reviewed wound care. Finish keflex. Update if not healing as expected, or any other concerns.

## 2017-12-01 NOTE — Assessment & Plan Note (Addendum)
Down to 1 PPD continue to encourage full cessation.

## 2017-12-01 NOTE — Patient Instructions (Signed)
Continue dressing changes daily. Ok to shower starting tomorrow, pat dry and place dressing after shower.  Finish antibiotic Watch for spreading redness or worsening pain or not healing well. If any concerns, return to see me.

## 2017-12-01 NOTE — Progress Notes (Signed)
BP 130/70 (BP Location: Right Arm, Patient Position: Sitting, Cuff Size: Large)   Pulse 81   Temp 97.9 F (36.6 C) (Oral)   Wt 249 lb (112.9 kg)   SpO2 95%   BMI 31.97 kg/m    CC: 5th finger injury on left Subjective:    Patient ID: Russell Gomez, male    DOB: 1961-08-12, 57 y.o.   MRN: [id number]  HPI: Russell Gomez is a 57 y.o. male presenting on 12/01/2017 for Finger Injury (To left 5th finger. Mashed finger while working on car on 11/24/17. Seen at [facility] ED on 11/24/17. Here for f/u)   DOI: 11/24/2017  Crushed L finger while working on car. Seen at [facility] ER - note reviewed. Tdap was updated. He suffered shave laceration, treated with surgicel to tip of finger. Treated with keflex QID abx x 10 days  Diabetic - endorses last A1c was low 6s (Dr Eddie Dibbles endo last seen 08/2017) Just changed lantus insulin to a generic.  Lab Results  Component Value Date   HGBA1C 6.0 11/23/2016     Relevant past medical, surgical, family and social history reviewed and updated as indicated. Interim medical history since our last visit reviewed. Allergies and medications reviewed and updated. Outpatient Medications Prior to Visit  Medication Sig Dispense Refill  . amLODipine (NORVASC) 5 MG tablet TAKE 1 TABLET BY MOUTH DAILY 90 tablet 0  . aspirin (ASPIRIN EC) 81 MG EC tablet Take 81 mg by mouth daily. Swallow whole.    . cephALEXin (KEFLEX) 500 MG capsule Take 1 capsule (500 mg total) by mouth 4 (four) times daily for 10 days. 40 capsule 0  . enalapril (VASOTEC) 20 MG tablet TAKE 1 TABLET BY MOUTH TWICE DAILY 180 tablet 2  . furosemide (LASIX) 20 MG tablet TAKE 1/2 TO 1 (ONE-HALF TO ONE) TABLET BY MOUTH ONCE DAILY AS NEEDED FOR EDEMA 30 tablet 3  . insulin glargine (LANTUS) 100 UNIT/ML injection Inject 18 Units into the skin daily with breakfast.     . insulin regular (HUMULIN R,NOVOLIN R) 100 UNIT/ML injection Inject into the skin as directed. Sliding scale (5- 8 units with meals)    .  metoCLOPramide (REGLAN) 10 MG tablet TAKE 1 TABLET BY MOUTH TWICE DAILY AS NEEDED 180 tablet 1  . metoprolol tartrate (LOPRESSOR) 25 MG tablet TAKE ONE TABLET BY MOUTH TWICE DAILY 60 tablet 11  . omeprazole (PRILOSEC) 40 MG capsule TAKE 1 CAPSULE BY MOUTH TWICE DAILY 180 capsule 0  . ondansetron (ZOFRAN) 4 MG tablet Take 1 tablet (4 mg total) by mouth every 8 (eight) hours as needed for nausea or vomiting. 20 tablet 0  . pravastatin (PRAVACHOL) 80 MG tablet TAKE 1 TABLET BY MOUTH DAILY 90 tablet 0   No facility-administered medications prior to visit.      Per HPI unless specifically indicated in ROS section below Review of Systems     Objective:    BP 130/70 (BP Location: Right Arm, Patient Position: Sitting, Cuff Size: Large)   Pulse 81   Temp 97.9 F (36.6 C) (Oral)   Wt 249 lb (112.9 kg)   SpO2 95%   BMI 31.97 kg/m   Wt Readings from Last 3 Encounters:  12/01/17 249 lb (112.9 kg)  11/24/17 250 lb (113.4 kg)  04/08/17 256 lb 8 oz (116.3 kg)    Physical Exam  Constitutional: He appears well-developed and well-nourished. No distress.  Skin: Skin is warm and dry.  L pinky tip s/p shave laceration  with epitheliarizing granulation tissue that is healing well without surrounding erythema  Nursing note and vitals reviewed.  Results for orders placed or performed in visit on 12/01/17  HM DIABETES EYE EXAM  Result Value Ref Range   HM Diabetic Eye Exam No Retinopathy No Retinopathy  HM DIABETES FOOT EXAM  Result Value Ref Range   HM Diabetic Foot Exam done by endo       Assessment & Plan:   Problem List Items Addressed This Visit    Diabetes mellitus type 1, controlled ([place])    I have no records from endo. I have asked him to sign ROI to request records from [facility].      Finger laceration, initial encounter - Primary    Healing well. Dressed again today. Reviewed wound care. Finish keflex. Update if not healing as expected, or any other concerns.      TOBACCO ABUSE     Down to 1 PPD continue to encourage full cessation.           Follow up plan: No Follow-up on file.  Ria Bush, MD

## 2017-12-07 ENCOUNTER — Other Ambulatory Visit: Payer: Self-pay | Admitting: Family Medicine

## 2017-12-21 ENCOUNTER — Other Ambulatory Visit: Payer: Self-pay | Admitting: Family Medicine

## 2017-12-27 ENCOUNTER — Other Ambulatory Visit: Payer: Self-pay | Admitting: Family Medicine

## 2017-12-28 ENCOUNTER — Telehealth: Payer: Self-pay | Admitting: Family Medicine

## 2017-12-28 NOTE — Telephone Encounter (Signed)
Copied from [place] [id number]. Topic: Quick Communication - Rx Refill/Question >> Dec 28, 2017  6:10 PM Oliver Pila B wrote: Medication:  omeprazole (PRILOSEC) 40 MG capsule [[id number]]   [employer] home delivery called to see if the prior Josem Kaufmann form has been received, contact @ [phone]

## 2017-12-29 ENCOUNTER — Telehealth: Payer: Self-pay

## 2017-12-29 NOTE — Telephone Encounter (Signed)
Copied from [place] [id number]. Topic: Quick Communication - Rx Refill/Question >> Dec 28, 2017  6:10 PM Oliver Pila B wrote: Medication:  omeprazole (PRILOSEC) 40 MG capsule [[id number]]   [employer] home delivery called to see if the prior Josem Kaufmann form has been received, contact @ [phone]

## 2017-12-29 NOTE — Telephone Encounter (Signed)
Spoke with pt [employer] informing them we have not received PA form.  Says they are faxing in the next few minutes.

## 2017-12-29 NOTE — Telephone Encounter (Signed)
Received faxed PA from [facility]. Placed in Dr. Synthia Innocent box.

## 2017-12-29 NOTE — Telephone Encounter (Signed)
See TE, 12/29/17.

## 2018-01-02 NOTE — Telephone Encounter (Signed)
Faxed PA to [facility].

## 2018-01-02 NOTE — Telephone Encounter (Signed)
filled out and in Lisa's box

## 2018-01-02 NOTE — Telephone Encounter (Signed)
Pt's insurance called in to follow up. Russell Gomez Medical illustrator) states that pts insurance will only cover 1 pill a day, they would like to either her PA completed for 2 a day as prescribed OR they need a updated Rx showing the 1 time a day to cover.    Please call back at:  - [phone] Option 3.

## 2018-01-02 NOTE — Telephone Encounter (Signed)
Received faxed PA approval effective 01/02/2018 through 01/02/2019.   Left message on vm for pt to call back.   Need to inform pt PA was approved for the omeprazole so [place] should be preparing it for him.

## 2018-01-02 NOTE — Telephone Encounter (Signed)
Patient called and informed that the PA was faxed this morning to [facility] for his Omeprazole. He verbalized understanding. I asked about the vomiting in the morning. He says "it's from the acid buildup where I've been out." I advised to buy some OTC until his is delivered. I advised to check with the pharmacist at the drug store to get the correct dosage he will need equivalent to what he takes, he verbalized understanding. I asked did he want a prescription sent to a local pharmacy to cover until his mail order comes in, he says "no, that won't be necessary. I will just get it at the drug store and wait on the mail order to come." I advised to call back if his symptoms persist and do not resolve.

## 2018-01-02 NOTE — Telephone Encounter (Signed)
Pt. Called about his Omeprozole, he put in for at [facility] on 1/30 and he is out.  Shows just sent in today.   He is needing this badly.  Throwing up everyday in the morning.    Is there anything that can be done

## 2018-01-03 NOTE — Telephone Encounter (Signed)
Message regarding approval of Omeprazole given to patient.

## 2018-01-12 ENCOUNTER — Encounter: Payer: Managed Care, Other (non HMO) | Admitting: Family Medicine

## 2018-01-22 ENCOUNTER — Other Ambulatory Visit: Payer: Self-pay | Admitting: Family Medicine

## 2018-01-22 DIAGNOSIS — R946 Abnormal results of thyroid function studies: Secondary | ICD-10-CM

## 2018-01-22 DIAGNOSIS — E1069 Type 1 diabetes mellitus with other specified complication: Secondary | ICD-10-CM

## 2018-01-22 DIAGNOSIS — Z1159 Encounter for screening for other viral diseases: Secondary | ICD-10-CM

## 2018-01-22 DIAGNOSIS — Z125 Encounter for screening for malignant neoplasm of prostate: Secondary | ICD-10-CM

## 2018-01-22 DIAGNOSIS — E785 Hyperlipidemia, unspecified: Secondary | ICD-10-CM

## 2018-01-22 DIAGNOSIS — Z1211 Encounter for screening for malignant neoplasm of colon: Secondary | ICD-10-CM

## 2018-01-25 ENCOUNTER — Other Ambulatory Visit (INDEPENDENT_AMBULATORY_CARE_PROVIDER_SITE_OTHER): Payer: Managed Care, Other (non HMO)

## 2018-01-25 DIAGNOSIS — Z125 Encounter for screening for malignant neoplasm of prostate: Secondary | ICD-10-CM

## 2018-01-25 DIAGNOSIS — E1069 Type 1 diabetes mellitus with other specified complication: Secondary | ICD-10-CM

## 2018-01-25 DIAGNOSIS — E785 Hyperlipidemia, unspecified: Secondary | ICD-10-CM

## 2018-01-25 DIAGNOSIS — Z1159 Encounter for screening for other viral diseases: Secondary | ICD-10-CM

## 2018-01-25 DIAGNOSIS — R946 Abnormal results of thyroid function studies: Secondary | ICD-10-CM

## 2018-01-25 LAB — HEMOGLOBIN A1C: Hgb A1c MFr Bld: 6.3 % (ref 4.6–6.5)

## 2018-01-25 LAB — T4, FREE: Free T4: 0.95 ng/dL (ref 0.60–1.60)

## 2018-01-25 LAB — TSH: TSH: 0.41 u[IU]/mL (ref 0.35–4.50)

## 2018-01-25 LAB — PSA: PSA: 0.23 ng/mL (ref 0.10–4.00)

## 2018-01-26 ENCOUNTER — Other Ambulatory Visit: Payer: Self-pay | Admitting: Family Medicine

## 2018-01-26 LAB — COMPREHENSIVE METABOLIC PANEL
ALBUMIN: 4 g/dL (ref 3.5–5.2)
ALK PHOS: 82 U/L (ref 39–117)
ALT: 28 U/L (ref 0–53)
AST: 25 U/L (ref 0–37)
BUN: 17 mg/dL (ref 6–23)
CO2: 31 meq/L (ref 19–32)
CREATININE: 0.9 mg/dL (ref 0.40–1.50)
Calcium: 9.6 mg/dL (ref 8.4–10.5)
Chloride: 101 mEq/L (ref 96–112)
GFR: 93 mL/min (ref >60.00)
Glucose, Bld: 101 mg/dL — ABNORMAL HIGH (ref 70–99)
Potassium: 4 mEq/L (ref 3.5–5.1)
Sodium: 143 mEq/L (ref 135–145)
TOTAL PROTEIN: 6.2 g/dL (ref 6.0–8.3)

## 2018-01-26 LAB — LIPID PANEL
Cholesterol: 172 mg/dL (ref 0–200)
HDL: 81.3 mg/dL (ref >39.00)
NONHDL: 90.57
Total CHOL/HDL Ratio: 2
Triglycerides: 269 mg/dL — ABNORMAL HIGH (ref 0.0–149.0)
VLDL: 53.8 mg/dL — ABNORMAL HIGH (ref 0.0–40.0)

## 2018-01-26 LAB — HEPATITIS C ANTIBODY
HEP C AB: NONREACTIVE
SIGNAL TO CUT-OFF: 0.04 (ref <1.00)

## 2018-01-26 LAB — LDL CHOLESTEROL, DIRECT: Direct LDL: 47 mg/dL

## 2018-01-29 ENCOUNTER — Other Ambulatory Visit: Payer: Self-pay | Admitting: Family Medicine

## 2018-02-01 ENCOUNTER — Encounter: Payer: Self-pay | Admitting: Family Medicine

## 2018-02-01 ENCOUNTER — Ambulatory Visit (INDEPENDENT_AMBULATORY_CARE_PROVIDER_SITE_OTHER): Payer: Managed Care, Other (non HMO) | Admitting: Family Medicine

## 2018-02-01 VITALS — BP 138/82 | HR 86 | Temp 97.9°F | Ht 72.0 in | Wt 250.5 lb

## 2018-02-01 DIAGNOSIS — Z1211 Encounter for screening for malignant neoplasm of colon: Secondary | ICD-10-CM | POA: Diagnosis not present

## 2018-02-01 DIAGNOSIS — E785 Hyperlipidemia, unspecified: Secondary | ICD-10-CM

## 2018-02-01 DIAGNOSIS — F17200 Nicotine dependence, unspecified, uncomplicated: Secondary | ICD-10-CM | POA: Diagnosis not present

## 2018-02-01 DIAGNOSIS — I10 Essential (primary) hypertension: Secondary | ICD-10-CM

## 2018-02-01 DIAGNOSIS — E1069 Type 1 diabetes mellitus with other specified complication: Secondary | ICD-10-CM

## 2018-02-01 DIAGNOSIS — Z0000 Encounter for general adult medical examination without abnormal findings: Secondary | ICD-10-CM | POA: Diagnosis not present

## 2018-02-01 DIAGNOSIS — R946 Abnormal results of thyroid function studies: Secondary | ICD-10-CM

## 2018-02-01 MED ORDER — FUROSEMIDE 20 MG PO TABS: 20.0000 mg | ORAL_TABLET | Freq: Every day | ORAL | 3 refills | Status: DC

## 2018-02-01 MED ORDER — OMEPRAZOLE 40 MG PO CPDR: 40.0000 mg | DELAYED_RELEASE_CAPSULE | Freq: Two times a day (BID) | ORAL | 3 refills | Status: DC

## 2018-02-01 MED ORDER — AMLODIPINE BESYLATE 5 MG PO TABS: 5.0000 mg | ORAL_TABLET | Freq: Every day | ORAL | 3 refills | Status: DC

## 2018-02-01 MED ORDER — METOPROLOL TARTRATE 25 MG PO TABS: 25.0000 mg | ORAL_TABLET | Freq: Two times a day (BID) | ORAL | 3 refills | Status: DC

## 2018-02-01 NOTE — Assessment & Plan Note (Signed)
Appreciate endo care. Upcoming appt next month.

## 2018-02-01 NOTE — Assessment & Plan Note (Signed)
Subclinical hyperthyroidism, stable TFTs off medication. Will continue to monitor.

## 2018-02-01 NOTE — Assessment & Plan Note (Signed)
Preventative protocols reviewed and updated unless pt declined. Discussed healthy diet and lifestyle.  

## 2018-02-01 NOTE — Progress Notes (Signed)
BP 138/82 (BP Location: Left Arm, Patient Position: Sitting, Cuff Size: Normal)   Pulse 86   Temp 97.9 F (36.6 C) (Oral)   Ht 6' (1.829 m)   Wt 250 lb 8 oz (113.6 kg)   SpO2 97%   BMI 33.97 kg/m    CC: CPE Subjective:    Patient ID: Russell Gomez, male    DOB: 1961-05-14, 57 y.o.   MRN: [id number]  HPI: Russell Gomez is a 57 y.o. male presenting on 02/01/2018 for Annual Exam   Smoker - chantix ineffective previously. Has considered patch. Currently not interested in cessation. Down to 1/2 ppd. 30+ PY hx.   Preventative: Colon cancer screening - pos iFOB 01/2014 - pt attributed to hemorrhoids, declines colonoscopy. Agrees to repeat iFOB Prostate cancer screening - discussed, declines.Strong stream, nocturia x1. Lung cancer screening - interested and eligible - will refer.  Flu - declines Pneumovax - 02/2015  Td 2010, Tdap 11/2017 Sunscreen use discussed, no changing moles on skin. Seat belt use discussed Smoker - down to 1/2 ppd Alcohol - 2-3 drinks/day  Caffeine: 4-5 cups/night Lives with wife, youngest son (14), no pets Occupation: Works 3rd shift; Furniture conservator/restorer at [employer]  Edu: 11yr Machinist degree Activity: walking, playing golf weekly Diet: good water, fruits/vegetables daily   Relevant past medical, surgical, family and social history reviewed and updated as indicated. Interim medical history since our last visit reviewed. Allergies and medications reviewed and updated. Outpatient Medications Prior to Visit  Medication Sig Dispense Refill  . aspirin (ASPIRIN EC) 81 MG EC tablet Take 81 mg by mouth daily. Swallow whole.    . enalapril (VASOTEC) 20 MG tablet TAKE 1 TABLET BY MOUTH TWICE DAILY 180 tablet 2  . glucose blood (ONE TOUCH ULTRA TEST) test strip Use 6 (six) times daily    . insulin regular (HUMULIN R) 100 units/mL injection INJECT 3 TO 10 UNITS SUBCUTANEOUSLY BEFORE MEALS AS DIRECTED    . pravastatin (PRAVACHOL) 80 MG tablet TAKE 1 TABLET BY  MOUTH DAILY 90 tablet 0  . amLODipine (NORVASC) 5 MG tablet TAKE 1 TABLET BY MOUTH DAILY 90 tablet 0  . furosemide (LASIX) 20 MG tablet TAKE 1/2 TO 1 (ONE-HALF TO ONE) TABLET BY MOUTH ONCE DAILY AS NEEDED FOR EDEMA 30 tablet 3  . insulin regular (HUMULIN R,NOVOLIN R) 100 UNIT/ML injection Inject into the skin as directed. Sliding scale (5- 8 units with meals)    . metoCLOPramide (REGLAN) 10 MG tablet TAKE 1 TABLET BY MOUTH TWICE DAILY AS NEEDED 180 tablet 1  . metoprolol tartrate (LOPRESSOR) 25 MG tablet TAKE 1 TABLET BY MOUTH TWICE DAILY (NEEDS  ANNUAL  PHYSICAL  APPOINTMENT  FOR  FURTHER  REFILLS) 60 tablet 0  . omeprazole (PRILOSEC) 40 MG capsule TAKE 1 CAPSULE BY MOUTH TWICE DAILY 180 capsule 0  . ondansetron (ZOFRAN) 4 MG tablet Take 1 tablet (4 mg total) by mouth every 8 (eight) hours as needed for nausea or vomiting. 20 tablet 0  . Insulin Glargine (BASAGLAR KWIKPEN) 100 UNIT/ML SOPN Inject 0.2 mLs (20 Units total) into the skin at bedtime.    . insulin glargine (LANTUS) 100 UNIT/ML injection Inject 18 Units into the skin daily with breakfast.      No facility-administered medications prior to visit.      Per HPI unless specifically indicated in ROS section below Review of Systems  Constitutional: Negative for activity change, appetite change, chills, fatigue, fever and unexpected weight change.  HENT: Positive for  congestion and sinus pressure. Negative for hearing loss.   Eyes: Negative for visual disturbance.  Respiratory: Negative for cough, chest tightness, shortness of breath and wheezing.   Cardiovascular: Negative for chest pain, palpitations and leg swelling.  Gastrointestinal: Negative for abdominal distention, abdominal pain, blood in stool, constipation, diarrhea, nausea and vomiting.  Genitourinary: Negative for difficulty urinating and hematuria.  Musculoskeletal: Negative for arthralgias, myalgias and neck pain.  Skin: Negative for rash.  Neurological: Negative for  dizziness, seizures, syncope and headaches.  Hematological: Negative for adenopathy. Bruises/bleeds easily.  Psychiatric/Behavioral: Negative for dysphoric mood. The patient is not nervous/anxious.        Objective:    BP 138/82 (BP Location: Left Arm, Patient Position: Sitting, Cuff Size: Normal)   Pulse 86   Temp 97.9 F (36.6 C) (Oral)   Ht 6' (1.829 m)   Wt 250 lb 8 oz (113.6 kg)   SpO2 97%   BMI 33.97 kg/m   Wt Readings from Last 3 Encounters:  02/01/18 250 lb 8 oz (113.6 kg)  12/01/17 249 lb (112.9 kg)  11/24/17 250 lb (113.4 kg)    Physical Exam  Constitutional: He is oriented to person, place, and time. He appears well-developed and well-nourished. No distress.  HENT:  Head: Normocephalic and atraumatic.  Right Ear: Hearing, tympanic membrane, external ear and ear canal normal.  Left Ear: Hearing, tympanic membrane, external ear and ear canal normal.  Nose: Nose normal.  Mouth/Throat: Uvula is midline, oropharynx is clear and moist and mucous membranes are normal. No oropharyngeal exudate, posterior oropharyngeal edema or posterior oropharyngeal erythema.  Eyes: Pupils are equal, round, and reactive to light. Conjunctivae and EOM are normal. No scleral icterus.  Neck: Normal range of motion. Neck supple. No thyromegaly present.  Cardiovascular: Normal rate, regular rhythm, normal heart sounds and intact distal pulses.  No murmur heard. Pulses:      Radial pulses are 2+ on the right side, and 2+ on the left side.  Pulmonary/Chest: Effort normal and breath sounds normal. No respiratory distress. He has no wheezes. He has no rales.  Abdominal: Soft. Bowel sounds are normal. He exhibits no distension and no mass. There is no tenderness. There is no rebound and no guarding.  Musculoskeletal: Normal range of motion. He exhibits no edema.  Lymphadenopathy:    He has no cervical adenopathy.  Neurological: He is alert and oriented to person, place, and time.  CN grossly  intact, station and gait intact  Skin: Skin is warm and dry. No rash noted.  Psychiatric: He has a normal mood and affect. His behavior is normal. Judgment and thought content normal.  Nursing note and vitals reviewed.  Results for orders placed or performed in visit on 01/25/18  Hepatitis C antibody  Result Value Ref Range   Hepatitis C Ab NON-REACTIVE NON-REACTI   SIGNAL TO CUT-OFF 0.04 <1.00  T4, free  Result Value Ref Range   Free T4 0.95 0.60 - 1.60 ng/dL  PSA  Result Value Ref Range   PSA 0.23 0.10 - 4.00 ng/mL  TSH  Result Value Ref Range   TSH 0.41 0.35 - 4.50 uIU/mL  Hemoglobin A1c  Result Value Ref Range   Hgb A1c MFr Bld 6.3 4.6 - 6.5 %  Comprehensive metabolic panel  Result Value Ref Range   Sodium 143 135 - 145 mEq/L   Potassium 4.0 3.5 - 5.1 mEq/L   Chloride 101 96 - 112 mEq/L   CO2 31 19 - 32 mEq/L  Glucose, Bld 101 (H) 70 - 99 mg/dL   BUN 17 6 - 23 mg/dL   Creatinine, Ser 0.90 0.40 - 1.50 mg/dL   Total Bilirubin 0.1 repeated (L) 0.2 - 1.2 mg/dL   Alkaline Phosphatase 82 39 - 117 U/L   AST 25 0 - 37 U/L   ALT 28 0 - 53 U/L   Total Protein 6.2 6.0 - 8.3 g/dL   Albumin 4.0 3.5 - 5.2 g/dL   Calcium 9.6 8.4 - 10.5 mg/dL   GFR 93.00 >60.00 mL/min  Lipid panel  Result Value Ref Range   Cholesterol 172 0 - 200 mg/dL   Triglycerides 269.0 (H) 0.0 - 149.0 mg/dL   HDL 81.30 >39.00 mg/dL   VLDL 53.8 (H) 0.0 - 40.0 mg/dL   Total CHOL/HDL Ratio 2    NonHDL 90.57   LDL cholesterol, direct  Result Value Ref Range   Direct LDL 47.0 mg/dL      Assessment & Plan:   Problem List Items Addressed This Visit    Abnormal thyroid function test    Subclinical hyperthyroidism, stable TFTs off medication. Will continue to monitor.       Diabetes mellitus type 1, controlled ([place])    Appreciate endo care. Upcoming appt next month.       Relevant Medications   insulin regular (HUMULIN R) 100 units/mL injection   Insulin Glargine (BASAGLAR KWIKPEN) 100 UNIT/ML SOPN     Essential hypertension    Chronic, stable. Continue current regimen.       Relevant Medications   amLODipine (NORVASC) 5 MG tablet   furosemide (LASIX) 20 MG tablet   metoprolol tartrate (LOPRESSOR) 25 MG tablet   Health maintenance examination - Primary    Preventative protocols reviewed and updated unless pt declined. Discussed healthy diet and lifestyle.       HLD (hyperlipidemia)    Chronic, stable on pravastatin - continue. Worsened hypertriglyceridemia - discussed diet changes to help control triglyceride levels. The 10-year ASCVD risk score [name], et al., 2013) is: 14.2%   Values used to calculate the score:     Age: 3 years     Sex: Male     Is Non-Hispanic African American: No     Diabetic: Yes     Tobacco smoker: Yes     Systolic Blood Pressure: 096 mmHg     Is BP treated: Yes     HDL Cholesterol: 81.3 mg/dL     Total Cholesterol: 172 mg/dL       Relevant Medications   amLODipine (NORVASC) 5 MG tablet   furosemide (LASIX) 20 MG tablet   metoprolol tartrate (LOPRESSOR) 25 MG tablet   TOBACCO ABUSE    Contemplative. Down to 1/2 ppd. 30+ PY hx. Interested in lung cancer screening referral - placed.       Relevant Orders   Ambulatory Referral for Lung Cancer Scre    Other Visit Diagnoses    Special screening for malignant neoplasms, colon       Relevant Orders   Ambulatory referral to Gastroenterology       Meds ordered this encounter  Medications  . amLODipine (NORVASC) 5 MG tablet    Sig: Take 1 tablet (5 mg total) by mouth daily.    Dispense:  90 tablet    Refill:  3  . furosemide (LASIX) 20 MG tablet    Sig: Take 1 tablet (20 mg total) by mouth daily.    Dispense:  90 tablet  Refill:  3  . metoprolol tartrate (LOPRESSOR) 25 MG tablet    Sig: Take 1 tablet (25 mg total) by mouth 2 (two) times daily.    Dispense:  180 tablet    Refill:  3  . omeprazole (PRILOSEC) 40 MG capsule    Sig: Take 1 capsule (40 mg total) by mouth 2 (two) times  daily.    Dispense:  180 capsule    Refill:  3   Orders Placed This Encounter  Procedures  . Ambulatory Referral for Lung Cancer Scre    Referral Priority:   Routine    Referral Type:   Consultation    Referral Reason:   Specialty Services Required    Number of Visits Requested:   1  . Ambulatory referral to Gastroenterology    Referral Priority:   Routine    Referral Type:   Consultation    Referral Reason:   Specialty Services Required    Number of Visits Requested:   1    Follow up plan: Return in about 1 year (around 02/02/2019) for annual exam, prior fasting for blood work.  Ria Bush, MD

## 2018-02-01 NOTE — Patient Instructions (Addendum)
We will set you up for colonoscopy at [facility].  We will refer you for lung cancer screening CT program.  You are doing well today - continue current medicines.  Return as needed or in 1 year for next physical.   Health Maintenance, Male A healthy lifestyle and preventive care is important for your health and wellness. Ask your health care provider about what schedule of regular examinations is right for you. What should I know about weight and diet? Eat a Healthy Diet  Eat plenty of vegetables, fruits, whole grains, low-fat dairy products, and lean protein.  Do not eat a lot of foods high in solid fats, added sugars, or salt.  Maintain a Healthy Weight Regular exercise can help you achieve or maintain a healthy weight. You should:  Do at least 150 minutes of exercise each week. The exercise should increase your heart rate and make you sweat (moderate-intensity exercise).  Do strength-training exercises at least twice a week.  Watch Your Levels of Cholesterol and Blood Lipids  Have your blood tested for lipids and cholesterol every 5 years starting at 56 years of age. If you are at high risk for heart disease, you should start having your blood tested when you are 56 years old. You may need to have your cholesterol levels checked more often if: ? Your lipid or cholesterol levels are high. ? You are older than 56 years of age. ? You are at high risk for heart disease.  What should I know about cancer screening? Many types of cancers can be detected early and may often be prevented. Lung Cancer  You should be screened every year for lung cancer if: ? You are a current smoker who has smoked for at least 30 years. ? You are a former smoker who has quit within the past 15 years.  Talk to your health care provider about your screening options, when you should start screening, and how often you should be screened.  Colorectal Cancer  Routine colorectal cancer screening usually  begins at 56 years of age and should be repeated every 5-10 years until you are 56 years old. You may need to be screened more often if early forms of precancerous polyps or small growths are found. Your health care provider may recommend screening at an earlier age if you have risk factors for colon cancer.  Your health care provider may recommend using home test kits to check for hidden blood in the stool.  A small camera at the end of a tube can be used to examine your colon (sigmoidoscopy or colonoscopy). This checks for the earliest forms of colorectal cancer.  Prostate and Testicular Cancer  Depending on your age and overall health, your health care provider may do certain tests to screen for prostate and testicular cancer.  Talk to your health care provider about any symptoms or concerns you have about testicular or prostate cancer.  Skin Cancer  Check your skin from head to toe regularly.  Tell your health care provider about any new moles or changes in moles, especially if: ? There is a change in a mole's size, shape, or color. ? You have a mole that is larger than a pencil eraser.  Always use sunscreen. Apply sunscreen liberally and repeat throughout the day.  Protect yourself by wearing long sleeves, pants, a wide-brimmed hat, and sunglasses when outside.  What should I know about heart disease, diabetes, and high blood pressure?  If you are 18-39 years of  age, have your blood pressure checked every 3-5 years. If you are 49 years of age or older, have your blood pressure checked every year. You should have your blood pressure measured twice-once when you are at a hospital or clinic, and once when you are not at a hospital or clinic. Record the average of the two measurements. To check your blood pressure when you are not at a hospital or clinic, you can use: ? An automated blood pressure machine at a pharmacy. ? A home blood pressure monitor.  Talk to your health care  provider about your target blood pressure.  If you are between 51-38 years old, ask your health care provider if you should take aspirin to prevent heart disease.  Have regular diabetes screenings by checking your fasting blood sugar level. ? If you are at a normal weight and have a low risk for diabetes, have this test once every three years after the age of 27. ? If you are overweight and have a high risk for diabetes, consider being tested at a younger age or more often.  A one-time screening for abdominal aortic aneurysm (AAA) by ultrasound is recommended for men aged 81-75 years who are current or former smokers. What should I know about preventing infection? Hepatitis B If you have a higher risk for hepatitis B, you should be screened for this virus. Talk with your health care provider to find out if you are at risk for hepatitis B infection. Hepatitis C Blood testing is recommended for:  Everyone born from [birth year] through 1964.  Anyone with known risk factors for hepatitis C.  Sexually Transmitted Diseases (STDs)  You should be screened each year for STDs including gonorrhea and chlamydia if: ? You are sexually active and are younger than 56 years of age. ? You are older than 56 years of age and your health care provider tells you that you are at risk for this type of infection. ? Your sexual activity has changed since you were last screened and you are at an increased risk for chlamydia or gonorrhea. Ask your health care provider if you are at risk.  Talk with your health care provider about whether you are at high risk of being infected with HIV. Your health care provider may recommend a prescription medicine to help prevent HIV infection.  What else can I do?  Schedule regular health, dental, and eye exams.  Stay current with your vaccines (immunizations).  Do not use any tobacco products, such as cigarettes, chewing tobacco, and e-cigarettes. If you need help quitting, ask  your health care provider.  Limit alcohol intake to no more than 2 drinks per day. One drink equals 12 ounces of beer, 5 ounces of wine, or 1 ounces of hard liquor.  Do not use street drugs.  Do not share needles.  Ask your health care provider for help if you need support or information about quitting drugs.  Tell your health care provider if you often feel depressed.  Tell your health care provider if you have ever been abused or do not feel safe at home. This information is not intended to replace advice given to you by your health care provider. Make sure you discuss any questions you have with your health care provider. Document Released: 04/22/2008 Document Revised: 06/23/2016 Document Reviewed: 07/29/2015 Elsevier Interactive Patient Education  [employer].

## 2018-02-01 NOTE — Assessment & Plan Note (Signed)
Contemplative. Down to 1/2 ppd. 30+ PY hx. Interested in lung cancer screening referral - placed.

## 2018-02-01 NOTE — Assessment & Plan Note (Addendum)
Chronic, stable on pravastatin - continue. Worsened hypertriglyceridemia - discussed diet changes to help control triglyceride levels. The 10-year ASCVD risk score [name], et al., 2013) is: 14.2%   Values used to calculate the score:     Age: 57 years     Sex: Male     Is Non-Hispanic African American: No     Diabetic: Yes     Tobacco smoker: Yes     Systolic Blood Pressure: 150 mmHg     Is BP treated: Yes     HDL Cholesterol: 81.3 mg/dL     Total Cholesterol: 172 mg/dL

## 2018-02-01 NOTE — Assessment & Plan Note (Signed)
Chronic, stable. Continue current regimen. 

## 2018-02-02 ENCOUNTER — Telehealth: Payer: Self-pay

## 2018-02-02 ENCOUNTER — Other Ambulatory Visit: Payer: Self-pay

## 2018-02-02 ENCOUNTER — Telehealth: Payer: Self-pay | Admitting: *Deleted

## 2018-02-02 DIAGNOSIS — Z1211 Encounter for screening for malignant neoplasm of colon: Secondary | ICD-10-CM

## 2018-02-02 NOTE — Telephone Encounter (Signed)
Gastroenterology Pre-Procedure Review  Request Date:  Requesting Physician: Dr.   PATIENT REVIEW QUESTIONS: The patient responded to the following health history questions as indicated:    1. Are you having any GI issues? no 2. Do you have a personal history of Polyps? no 3. Do you have a family history of Colon Cancer or Polyps? no 4. Diabetes Mellitus? yes (Type 2) 5. Joint replacements in the past 12 months?no 6. Major health problems in the past 3 months?no 7. Any artificial heart valves, MVP, or defibrillator?no    MEDICATIONS & ALLERGIES:    Patient reports the following regarding taking any anticoagulation/antiplatelet therapy:   Plavix, Coumadin, Eliquis, Xarelto, Lovenox, Pradaxa, Brilinta, or Effient? no Aspirin? yes (ASA 81mg )  Patient confirms/reports the following medications:  Current Outpatient Medications  Medication Sig Dispense Refill  . amLODipine (NORVASC) 5 MG tablet Take 1 tablet (5 mg total) by mouth daily. 90 tablet 3  . aspirin (ASPIRIN EC) 81 MG EC tablet Take 81 mg by mouth daily. Swallow whole.    . enalapril (VASOTEC) 20 MG tablet TAKE 1 TABLET BY MOUTH TWICE DAILY 180 tablet 2  . furosemide (LASIX) 20 MG tablet Take 1 tablet (20 mg total) by mouth daily. 90 tablet 3  . glucose blood (ONE TOUCH ULTRA TEST) test strip Use 6 (six) times daily    . Insulin Glargine (BASAGLAR KWIKPEN) 100 UNIT/ML SOPN Inject 0.2 mLs (20 Units total) into the skin at bedtime.    . insulin regular (HUMULIN R) 100 units/mL injection INJECT 3 TO 10 UNITS SUBCUTANEOUSLY BEFORE MEALS AS DIRECTED    . metoprolol tartrate (LOPRESSOR) 25 MG tablet Take 1 tablet (25 mg total) by mouth 2 (two) times daily. 180 tablet 3  . omeprazole (PRILOSEC) 40 MG capsule Take 1 capsule (40 mg total) by mouth 2 (two) times daily. 180 capsule 3  . pravastatin (PRAVACHOL) 80 MG tablet TAKE 1 TABLET BY MOUTH DAILY 90 tablet 0   No current facility-administered medications for this visit.     Patient  confirms/reports the following allergies:  Allergies  Allergen Reactions  . Insulin Aspart Other (See Comments)    CELLULITIS  . Spironolactone Nausea Only and Rash  . Hydrochlorothiazide Itching and Rash  . Morphine Other (See Comments)    "MAKES ME MEAN"    No orders of the defined types were placed in this encounter.   AUTHORIZATION INFORMATION Primary Insurance: 1D#: Group #:  Secondary Insurance: 1D#: Group #:  SCHEDULE INFORMATION: Date: 05/09/18 Time: Location: [place]

## 2018-02-02 NOTE — Telephone Encounter (Signed)
Received referral for low dose lung cancer screening CT scan. Message left at phone number listed in EMR for patient to call me back to facilitate scheduling scan.  

## 2018-02-03 ENCOUNTER — Telehealth: Payer: Self-pay | Admitting: Family Medicine

## 2018-02-03 ENCOUNTER — Telehealth: Payer: Self-pay | Admitting: *Deleted

## 2018-02-03 MED ORDER — FUROSEMIDE 20 MG PO TABS: 20.0000 mg | ORAL_TABLET | Freq: Every day | ORAL | 0 refills | Status: DC

## 2018-02-03 NOTE — Telephone Encounter (Signed)
Patient called and left VM that prescription sent to [facility] and if he needs enough sent to a local pharmacy until the mail order arrives, call the office back.

## 2018-02-03 NOTE — Telephone Encounter (Signed)
Copied from [place] [phone]. Topic: Quick Communication - Rx Refill/Question >> Feb 03, 2018 11:04 AM Carolyn Stare wrote: Med was sent to wrong pharmacy [facility]  home delivery  pt need med to [facility]   Medication furosemide (LASIX) 20 MG tablet  Preferred [facility]  Agent: Please be advised that RX refills may take up to 3 business days. We ask that you follow-up with your pharmacy.

## 2018-02-03 NOTE — Telephone Encounter (Signed)
Left message for patient to call back- called to mail order pharmacy- they have already mailed the medication the day after it was ordered. We can send it into [facility]- but his insurance may not cover it if it has already been filled at mail order. He should be receiving anytime now. He can call back and let us know what he would like [place] to do.

## 2018-02-03 NOTE — Addendum Note (Signed)
Addended by: Tarry Kos on: 02/03/2018 05:06 PM   Modules accepted: Orders

## 2018-02-03 NOTE — Telephone Encounter (Signed)
Received referral for low dose lung cancer screening CT scan. Message left at phone number listed in EMR for patient to call me back to facilitate scheduling scan.  

## 2018-02-03 NOTE — Telephone Encounter (Signed)
Patient phoned back to request one month supply Lasix be sent to local pharmacy while he waits on mail order-Done.

## 2018-02-09 ENCOUNTER — Telehealth: Payer: Self-pay | Admitting: *Deleted

## 2018-02-09 NOTE — Telephone Encounter (Signed)
Received referral for low dose lung cancer screening CT scan. Message left at phone number listed in EMR for patient to call me back to facilitate scheduling scan.  

## 2018-02-23 ENCOUNTER — Encounter: Payer: Self-pay | Admitting: *Deleted

## 2018-03-31 ENCOUNTER — Observation Stay
Admission: EM | Admit: 2018-03-31 | Discharge: 2018-04-01 | Disposition: A | Discharge status: Home / Self Care | Payer: 59 | Attending: Internal Medicine | Admitting: Internal Medicine

## 2018-03-31 ENCOUNTER — Other Ambulatory Visit: Payer: Self-pay

## 2018-03-31 DIAGNOSIS — F17210 Nicotine dependence, cigarettes, uncomplicated: Secondary | ICD-10-CM | POA: Insufficient documentation

## 2018-03-31 DIAGNOSIS — E860 Dehydration: Principal | ICD-10-CM | POA: Insufficient documentation

## 2018-03-31 DIAGNOSIS — E785 Hyperlipidemia, unspecified: Secondary | ICD-10-CM | POA: Diagnosis not present

## 2018-03-31 DIAGNOSIS — Z7982 Long term (current) use of aspirin: Secondary | ICD-10-CM | POA: Insufficient documentation

## 2018-03-31 DIAGNOSIS — Z794 Long term (current) use of insulin: Secondary | ICD-10-CM | POA: Insufficient documentation

## 2018-03-31 DIAGNOSIS — K219 Gastro-esophageal reflux disease without esophagitis: Secondary | ICD-10-CM | POA: Diagnosis not present

## 2018-03-31 DIAGNOSIS — I10 Essential (primary) hypertension: Secondary | ICD-10-CM | POA: Insufficient documentation

## 2018-03-31 DIAGNOSIS — R42 Dizziness and giddiness: Secondary | ICD-10-CM | POA: Diagnosis present

## 2018-03-31 DIAGNOSIS — I959 Hypotension, unspecified: Secondary | ICD-10-CM | POA: Insufficient documentation

## 2018-03-31 DIAGNOSIS — Z79899 Other long term (current) drug therapy: Secondary | ICD-10-CM | POA: Diagnosis not present

## 2018-03-31 DIAGNOSIS — E109 Type 1 diabetes mellitus without complications: Secondary | ICD-10-CM | POA: Insufficient documentation

## 2018-03-31 DIAGNOSIS — N179 Acute kidney failure, unspecified: Secondary | ICD-10-CM | POA: Insufficient documentation

## 2018-03-31 DIAGNOSIS — E7800 Pure hypercholesterolemia, unspecified: Secondary | ICD-10-CM | POA: Diagnosis not present

## 2018-03-31 DIAGNOSIS — R55 Syncope and collapse: Secondary | ICD-10-CM

## 2018-03-31 LAB — CBC
HEMATOCRIT: 43.4 % (ref 40.0–52.0)
HEMOGLOBIN: 15.3 g/dL (ref 13.0–18.0)
MCH: 34.1 pg — ABNORMAL HIGH (ref 26.0–34.0)
MCHC: 35.2 g/dL (ref 32.0–36.0)
MCV: 96.7 fL (ref 80.0–100.0)
Platelets: 353 10*3/uL (ref 150–440)
RBC: 4.49 MIL/uL (ref 4.40–5.90)
RDW: 14.2 % (ref 11.5–14.5)
WBC: 10.7 10*3/uL — ABNORMAL HIGH (ref 3.8–10.6)

## 2018-03-31 LAB — CK: Total CK: 272 U/L (ref 49–397)

## 2018-03-31 LAB — BASIC METABOLIC PANEL
ANION GAP: 17 — AB (ref 5–15)
BUN: 45 mg/dL — ABNORMAL HIGH (ref 6–20)
CHLORIDE: 99 mmol/L — AB (ref 101–111)
CO2: 24 mmol/L (ref 22–32)
Calcium: 9.3 mg/dL (ref 8.9–10.3)
Creatinine, Ser: 3.77 mg/dL — ABNORMAL HIGH (ref 0.61–1.24)
GFR calc Af Amer: 19 mL/min — ABNORMAL LOW (ref >60)
GFR, EST NON AFRICAN AMERICAN: 17 mL/min — AB (ref >60)
Glucose, Bld: 80 mg/dL (ref 65–99)
POTASSIUM: 3.2 mmol/L — AB (ref 3.5–5.1)
SODIUM: 140 mmol/L (ref 135–145)

## 2018-03-31 LAB — GLUCOSE, CAPILLARY: Glucose-Capillary: 67 mg/dL (ref 65–99)

## 2018-03-31 LAB — TROPONIN I: Troponin I: <0.03 ng/mL (ref <0.03)

## 2018-03-31 MED ORDER — SODIUM CHLORIDE 0.9 % IV BOLUS
1000.0000 mL | Freq: Once | INTRAVENOUS | Status: AC
  Administered 2018-03-31: 1000 mL via INTRAVENOUS

## 2018-03-31 NOTE — ED Triage Notes (Signed)
Patient reports 5 episodes of near syncope today within 1 hour. Patient c/o continued dizziness and weakness.  Patient reports 1 episode of diarrhea. Patient was working in extremely hot environment. Patient has hx of hypertension.

## 2018-03-31 NOTE — ED Notes (Signed)
Family at bedside. 

## 2018-03-31 NOTE — ED Provider Notes (Addendum)
[facility] Emergency Department Provider Note  ____________________________________________   I have reviewed the triage vital signs and the nursing notes. Where available I have reviewed prior notes and, if possible and indicated, outside hospital notes.    HISTORY  Chief Complaint Hypotension and Near Syncope    HPI Russell Gomez is a 57 y.o. male  Today complaining of feeling lightheaded.  Patient does have a history of hypertension he has had no recent change in his medications.  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  He states he was in his normal state of health but that while working he became weak and somewhat lightheaded and he feels dehydrated.  He did not have much water to drink today.  He has had one episode of diarrhea.  Blood pressure initially was 71/47, but when he came back he was in the high 80s.  He does not know what his baseline blood pressure is.  His single episode of diarrhea happened in the waiting room, and it was nonbloody and nonbilious he states with no melena.  He has not vomited.  He has no chest pain shortness of breath nausea or vomiting at this time, he denies any abdominal pain, he denies any chest pain shortness of breath, he denies any focal neurologic deficit headache or fall.  He denies any closed head injury. He states now that he is in a cool environment he feels much better.  Past Medical History:  Diagnosis Date  . Arthritis    on operative finger  . Dental crowns present   . Diabetes type 1, uncontrolled (HCC)    IDDM (Dr. Eddie Dibbles @ [facility])  . Esophageal reflux   . Goiter 04/2012   no current med, has yearly monitoring  . High cholesterol   . HTN (hypertension)    under control with meds, has been on med x 2 yr.    Patient Active Problem List   Diagnosis Date Noted  . Pedal edema 04/08/2017  . Abnormal thyroid function test 02/13/2017  . Health  maintenance examination 01/23/2014  . Colloid thyroid nodule 05/05/2012  . Erectile dysfunction 04/19/2012  . Diabetes mellitus type 1, controlled ([place]) 10/22/2010  . HLD (hyperlipidemia) 10/22/2010  . TOBACCO ABUSE 10/22/2010  . Essential hypertension 10/22/2010  . GERD 10/22/2010    Past Surgical History:  Procedure Laterality Date  . DISTAL INTERPHALANGEAL JOINT FUSION Right 03/05/2014   Procedure: DEBRIDEMENT (DIP) DISTAL INTERPHALANGEAL RIGHT MIDDLE FINGER;  Surgeon: Wynonia Sours, MD;  Location: [place];  Service: Orthopedics;  Laterality: Right;  . KNEE ARTHROSCOPY    . MASS EXCISION Right 03/05/2014   Procedure: EXCISION CYST ;  Surgeon: Wynonia Sours, MD;  Location: [place];  Service: Orthopedics;  Laterality: Right;  ANESTHESIA: IV REGINAL FAB  . OPEN REDUCTION NASAL FRACTURE  12/26/2008   with closure of nasal lac.  Marland Kitchen ORIF DISTAL RADIUS FRACTURE Right 12/26/2008  . ORIF WRIST FRACTURE Left 05/12/2016   takedown of nonunion/malunion and OPEN REDUCTION INTERNAL FIXATION (ORIF) WRIST FRACTURE;  Surgeon: Corky Mull, MD  . PERCUTANEOUS PINNING Left 02/06/2013   Procedure: PINNING PIP OF THE LEFT MIDDLE FINGER ;  Surgeon: Wynonia Sours, MD;  Location: [place];  Service: Orthopedics;  Laterality: Left;  . SKIN GRAFT SPLIT THICKNESS LEG / FOOT Right 1985   thigh after trauma vs machine at work  .  TRIGGER FINGER RELEASE  11/28/2012   Procedure: RELEASE TRIGGER FINGER/A-1 PULLEY;  Surgeon: Wynonia Sours, MD;  Laterality: Left;  EXCISION MASS LEFT RING FINGER, RELEASE A-1 PULLEY LEFT RING FINGER (ganglion cyst)    Prior to Admission medications   Medication Sig Start Date End Date Taking? Authorizing Provider  amLODipine (NORVASC) 5 MG tablet Take 1 tablet (5 mg total) by mouth daily. 02/01/18   Ria Bush, MD  aspirin (ASPIRIN EC) 81 MG EC tablet Take 81 mg by mouth daily. Swallow whole.    [provider]  enalapril  (VASOTEC) 20 MG tablet TAKE 1 TABLET BY MOUTH TWICE DAILY 10/24/17   Ria Bush, MD  furosemide (LASIX) 20 MG tablet Take 1 tablet (20 mg total) by mouth daily. 02/03/18   Ria Bush, MD  glucose blood (ONE TOUCH ULTRA TEST) test strip Use 6 (six) times daily 01/16/18   [provider]  Insulin Glargine (BASAGLAR KWIKPEN) 100 UNIT/ML SOPN Inject 0.2 mLs (20 Units total) into the skin at bedtime. 02/01/18   Ria Bush, MD  insulin regular (HUMULIN R) 100 units/mL injection INJECT 3 TO 10 UNITS SUBCUTANEOUSLY BEFORE MEALS AS DIRECTED 01/11/18   [provider]  metoprolol tartrate (LOPRESSOR) 25 MG tablet Take 1 tablet (25 mg total) by mouth 2 (two) times daily. 02/01/18   Ria Bush, MD  omeprazole (PRILOSEC) 40 MG capsule Take 1 capsule (40 mg total) by mouth 2 (two) times daily. 02/01/18   Ria Bush, MD  pravastatin (PRAVACHOL) 80 MG tablet TAKE 1 TABLET BY MOUTH DAILY 01/27/18   Ria Bush, MD    Allergies Insulin aspart; Spironolactone; Hydrochlorothiazide; and Morphine  Family History  Problem Relation Age of Onset  . Healthy Father   . Hypertension Mother   . Hyperlipidemia Mother   . Diabetes Maternal Uncle   . Coronary artery disease Maternal Uncle   . Stroke Maternal Uncle   . Diabetes Paternal Grandmother   . Diabetes Paternal Uncle     Social History Social History   Tobacco Use  . Smoking status: Current Every Day Smoker    Packs/day: 1.00    Years: 20.00    Pack years: 20.00    Types: Cigarettes  . Smokeless tobacco: Never Used  . Tobacco comment: smokes 1 pack a day  Substance Use Topics  . Alcohol use: Yes    Alcohol/week: 4.2 oz    Types: 7 Cans of beer per week    Comment:    . Drug use: No    Review of Systems Constitutional: No fever/chills Eyes: No visual changes. ENT: No sore throat. No stiff neck no neck pain Cardiovascular: Denies chest pain. Respiratory: Denies shortness of  breath. Gastrointestinal:   no vomiting.  No diarrhea.  No constipation. Genitourinary: Negative for dysuria. Musculoskeletal: Negative lower extremity swelling Skin: Negative for rash. Neurological: Negative for severe headaches, focal weakness or numbness.   ____________________________________________   PHYSICAL EXAM:  VITAL SIGNS: ED Triage Vitals  Enc Vitals Group     BP 03/31/18 2126 (!) 71/47     Pulse Rate 03/31/18 2126 96     Resp 03/31/18 2126 18     Temp 03/31/18 2126 97.8 F (36.6 C)     Temp Source 03/31/18 2126 Oral     SpO2 03/31/18 2126 98 %     Weight 03/31/18 2127 250 lb (113.4 kg)     Height --      Head Circumference --      Peak Flow --  Pain Score 03/31/18 2127 0     Pain Loc --      Pain Edu? --      Excl. in [place]? --     Constitutional: Alert and oriented. Well appearing and in no acute distress. Eyes: Conjunctivae are normal Head: Atraumatic HEENT: No congestion/rhinnorhea. Mucous membranes are dry.  Oropharynx non-erythematous Neck:   Nontender with no meningismus, no masses, no stridor Cardiovascular: Normal rate, regular rhythm. Grossly normal heart sounds.  Good peripheral circulation. Respiratory: Normal respiratory effort.  No retractions. Lungs CTAB. Abdominal: Soft and nontender. No distention. No guarding no rebound Back:  There is no focal tenderness or step off.  there is no midline tenderness there are no lesions noted. there is no CVA tenderness  Musculoskeletal: No lower extremity tenderness, no upper extremity tenderness. No joint effusions, no DVT signs strong distal pulses no edema Neurologic:  Normal speech and language. No gross focal neurologic deficits are appreciated.  Skin:  Skin is warm, dry and intact. No rash noted. Psychiatric: Mood and affect are normal. Speech and behavior are normal.  ____________________________________________   LABS (all labs ordered are listed, but only abnormal results are  displayed)  Labs Reviewed  BASIC METABOLIC PANEL  CBC  URINALYSIS, COMPLETE (UACMP) WITH MICROSCOPIC  CK  CBG MONITORING, ED    Pertinent labs  results that were available during my care of the patient were reviewed by me and considered in my medical decision making (see chart for details). ____________________________________________  EKG  I personally interpreted any EKGs ordered by me or triage Sinus rhythm rate 97 bpm no acute ST elevation or depression, LAD noted.  No acute ischemia. ____________________________________________  RADIOLOGY  Pertinent labs & imaging results that were available during my care of the patient were reviewed by me and considered in my medical decision making (see chart for details). If possible, patient and/or family made aware of any abnormal findings.  No results found. ____________________________________________    PROCEDURES  Procedure(s) performed: None  Procedures  Critical Care performed: CRITICAL CARE Performed by: Schuyler Amor   Total critical care time: 45 minutes  Critical care time was exclusive of separately billable procedures and treating other patients.  Critical care was necessary to treat or prevent imminent or life-threatening deterioration.  Critical care was time spent personally by me on the following activities: development of treatment plan with patient and/or surrogate as well as nursing, discussions with consultants, evaluation of patient's response to treatment, examination of patient, obtaining history from patient or surrogate, ordering and performing treatments and interventions, ordering and review of laboratory studies, ordering and review of radiographic studies, pulse oximetry and re-evaluation of patient's condition.   ____________________________________________   INITIAL IMPRESSION / ASSESSMENT AND PLAN / ED COURSE  Pertinent labs & imaging results that were available during my care of the  patient were reviewed by me and considered in my medical decision making (see chart for details).  Looks clinically dehydrated, works in a very hot environment did not have very much drink today and had one episode of nonbloody diarrhea, consequently and subsequently had low blood pressure.  We are giving him IV fluid here, his pressures in the low 90s at this time, we will continue to observe him closely, we are sending blood work including total CK, urinalysis etc. we will ensure that there is no other contributing cause but patient looks quite consistent with his chief complaint which is essentially dehydration hypotension and diarrhea  ----------------------------------------- 10:42 PM on  03/31/2018 -----------------------------------------  In no acute distress, pro pressures responding well to fluid, creatinine is almost 4, this is new for him.  Patient states he is been working in the incinerator for the last 2 weeks and barely has been urinating at all since he has been there does not feel that he is keeping up on his fluids.  Given acute kidney injury of significance, we will admit overnight for hydration.    ____________________________________________   FINAL CLINICAL IMPRESSION(S) / ED DIAGNOSES  Final diagnoses:  None      This chart was dictated using voice recognition software.  Despite best efforts to proofread,  errors can occur which can change meaning.      Schuyler Amor, MD 03/31/18 2142    Schuyler Amor, MD 03/31/18 2211    Schuyler Amor, MD 03/31/18 2423    Schuyler Amor, MD 03/31/18 [phone]

## 2018-04-01 ENCOUNTER — Inpatient Hospital Stay: Payer: 59

## 2018-04-01 DIAGNOSIS — N179 Acute kidney failure, unspecified: Secondary | ICD-10-CM | POA: Diagnosis present

## 2018-04-01 LAB — BASIC METABOLIC PANEL
ANION GAP: 5 (ref 5–15)
Anion gap: 12 (ref 5–15)
BUN: 41 mg/dL — AB (ref 6–20)
BUN: 29 mg/dL — ABNORMAL HIGH (ref 6–20)
CHLORIDE: 103 mmol/L (ref 101–111)
CO2: 22 mmol/L (ref 22–32)
CO2: 23 mmol/L (ref 22–32)
CREATININE: 1.38 mg/dL — AB (ref 0.61–1.24)
Calcium: 8 mg/dL — ABNORMAL LOW (ref 8.9–10.3)
Calcium: 7.7 mg/dL — ABNORMAL LOW (ref 8.9–10.3)
Chloride: 106 mmol/L (ref 101–111)
Creatinine, Ser: 2.44 mg/dL — ABNORMAL HIGH (ref 0.61–1.24)
GFR calc Af Amer: 33 mL/min — ABNORMAL LOW (ref >60)
GFR calc non Af Amer: 28 mL/min — ABNORMAL LOW (ref >60)
GFR calc non Af Amer: 56 mL/min — ABNORMAL LOW (ref >60)
GLUCOSE: 152 mg/dL — AB (ref 65–99)
Glucose, Bld: 240 mg/dL — ABNORMAL HIGH (ref 65–99)
Potassium: 3.4 mmol/L — ABNORMAL LOW (ref 3.5–5.1)
Potassium: 4.1 mmol/L (ref 3.5–5.1)
SODIUM: 137 mmol/L (ref 135–145)
SODIUM: 134 mmol/L — AB (ref 135–145)

## 2018-04-01 LAB — CBC
HCT: 38.6 % — ABNORMAL LOW (ref 40.0–52.0)
Hemoglobin: 13.5 g/dL (ref 13.0–18.0)
MCH: 34.3 pg — ABNORMAL HIGH (ref 26.0–34.0)
MCHC: 35 g/dL (ref 32.0–36.0)
MCV: 98.1 fL (ref 80.0–100.0)
Platelets: 280 10*3/uL (ref 150–440)
RBC: 3.93 MIL/uL — ABNORMAL LOW (ref 4.40–5.90)
RDW: 14 % (ref 11.5–14.5)
WBC: 7.3 10*3/uL (ref 3.8–10.6)

## 2018-04-01 LAB — URINALYSIS, COMPLETE (UACMP) WITH MICROSCOPIC
BACTERIA UA: NONE SEEN
Bilirubin Urine: NEGATIVE
GLUCOSE, UA: NEGATIVE mg/dL
Ketones, ur: 5 mg/dL — AB
Leukocytes, UA: NEGATIVE
NITRITE: NEGATIVE
PROTEIN: 30 mg/dL — AB
Specific Gravity, Urine: 1.015 (ref 1.005–1.030)
pH: 5 (ref 5.0–8.0)

## 2018-04-01 LAB — GLUCOSE, CAPILLARY
GLUCOSE-CAPILLARY: 249 mg/dL — AB (ref 65–99)
Glucose-Capillary: 171 mg/dL — ABNORMAL HIGH (ref 65–99)
Glucose-Capillary: 151 mg/dL — ABNORMAL HIGH (ref 65–99)
Glucose-Capillary: 278 mg/dL — ABNORMAL HIGH (ref 65–99)
Glucose-Capillary: 288 mg/dL — ABNORMAL HIGH (ref 65–99)

## 2018-04-01 MED ORDER — PRAVASTATIN SODIUM 40 MG PO TABS
80.0000 mg | ORAL_TABLET | Freq: Every day | ORAL | Status: DC
  Administered 2018-04-01: 17:00:00 80 mg via ORAL
  Filled 2018-04-01: qty 4
  Filled 2018-04-01: qty 2

## 2018-04-01 MED ORDER — POTASSIUM CHLORIDE IN NACL 20-0.9 MEQ/L-% IV SOLN
INTRAVENOUS | Status: DC
  Administered 2018-04-01: 06:00:00 via INTRAVENOUS
  Filled 2018-04-01 (×4): qty 1000

## 2018-04-01 MED ORDER — ONDANSETRON HCL 4 MG PO TABS: 4.0000 mg | ORAL_TABLET | Freq: Four times a day (QID) | ORAL | Status: DC | PRN

## 2018-04-01 MED ORDER — HEPARIN SODIUM (PORCINE) 5000 UNIT/ML IJ SOLN
5000.0000 [IU] | Freq: Three times a day (TID) | INTRAMUSCULAR | Status: DC
  Administered 2018-04-01: 5000 [IU] via SUBCUTANEOUS
  Filled 2018-04-01: qty 1

## 2018-04-01 MED ORDER — ONDANSETRON HCL 4 MG/2ML IJ SOLN: 4.0000 mg | Freq: Four times a day (QID) | INTRAMUSCULAR | Status: DC | PRN

## 2018-04-01 MED ORDER — HYDROCODONE-ACETAMINOPHEN 5-325 MG PO TABS: 1.0000 | ORAL_TABLET | ORAL | Status: DC | PRN

## 2018-04-01 MED ORDER — ACETAMINOPHEN 325 MG PO TABS: 650.0000 mg | ORAL_TABLET | Freq: Four times a day (QID) | ORAL | Status: DC | PRN

## 2018-04-01 MED ORDER — DOCUSATE SODIUM 100 MG PO CAPS: 100.0000 mg | ORAL_CAPSULE | Freq: Two times a day (BID) | ORAL | Status: DC

## 2018-04-01 MED ORDER — ASPIRIN EC 81 MG PO TBEC
81.0000 mg | DELAYED_RELEASE_TABLET | Freq: Every day | ORAL | Status: DC
  Administered 2018-04-01: 81 mg via ORAL
  Filled 2018-04-01: qty 1

## 2018-04-01 MED ORDER — INSULIN ASPART 100 UNIT/ML ~~LOC~~ SOLN
0.0000 [IU] | Freq: Three times a day (TID) | SUBCUTANEOUS | Status: DC
  Administered 2018-04-01: 5 [IU] via SUBCUTANEOUS
  Administered 2018-04-01: 12:00:00 8 [IU] via SUBCUTANEOUS
  Administered 2018-04-01: 3 [IU] via SUBCUTANEOUS
  Filled 2018-04-01 (×3): qty 1

## 2018-04-01 MED ORDER — SODIUM CHLORIDE 0.9 % IV BOLUS
500.0000 mL | Freq: Once | INTRAVENOUS | Status: AC
  Administered 2018-04-01: 500 mL via INTRAVENOUS

## 2018-04-01 MED ORDER — ACETAMINOPHEN 650 MG RE SUPP: 650.0000 mg | Freq: Four times a day (QID) | RECTAL | Status: DC | PRN

## 2018-04-01 MED ORDER — INSULIN GLARGINE 100 UNIT/ML ~~LOC~~ SOLN
10.0000 [IU] | Freq: Every day | SUBCUTANEOUS | Status: DC
  Filled 2018-04-01: qty 0.1

## 2018-04-01 MED ORDER — SODIUM CHLORIDE 0.9 % IV SOLN
INTRAVENOUS | Status: DC
  Administered 2018-04-01: 03:00:00 via INTRAVENOUS

## 2018-04-01 MED ORDER — PANTOPRAZOLE SODIUM 40 MG PO TBEC
40.0000 mg | DELAYED_RELEASE_TABLET | Freq: Every day | ORAL | Status: DC
  Administered 2018-04-01: 08:00:00 40 mg via ORAL
  Filled 2018-04-01: qty 1

## 2018-04-01 MED ORDER — BISACODYL 5 MG PO TBEC: 5.0000 mg | DELAYED_RELEASE_TABLET | Freq: Every day | ORAL | Status: DC | PRN

## 2018-04-01 NOTE — Progress Notes (Signed)
Took over pt care at 1630, pt alert, watching TV, no complaints, hoping to discharge home once labs results are in
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
## 2018-04-01 NOTE — ED Notes (Signed)
Pt given 8 oz apple juice and diabetic meal tray

## 2018-04-01 NOTE — Progress Notes (Addendum)
Pt arrived to room and requested a shower before any assessment or treatment.  Explained that because he had almost passed out at work, I did not think the doctor would allow a shower but I would ask.  Pt then stated that he would just leave if he couldn't have a shower.  Spoke to prime doc and order for shower given.  Informed pt that I was not responsible for him if he fell in the shower and that he was causing a delay in treatment by not allowing me to start IV fluids, check blood sugar, vitals, medication.  Pt education given and pt verbalized understanding. Dorna Bloom RN 0300 Able to do profile and start fluids.  Pt reports that he had a diabetic seizure two weeks ago and fell. Dorna Bloom RN

## 2018-04-01 NOTE — Progress Notes (Signed)
Per lab work this morning blood sugar is 152.  Text paged [facility] doc for orders for ACHS with ssc.  Type I DM.  Pt does have an allergy to Insulin aspart.  Normally takes Lantus 10 units at bedtime.  None received last night as he had a blood sugar of 67 with a recheck of 171 after apple juice and sandwich once he arrived on this unit. Dorna Bloom RN

## 2018-04-01 NOTE — Progress Notes (Signed)
Pharmacy Electrolyte Monitoring Consult:  Pharmacy consulted to assist in monitoring and replacing electrolytes in this 57 y.o. male admitted on 03/31/2018 with Hypotension and Near Syncope   Labs:  Sodium (mmol/L)  Date Value  04/01/2018 137  04/03/2013 135 (A)   Potassium (mmol/L)  Date Value  04/01/2018 3.4 (L)   Calcium (mg/dL)  Date Value  04/01/2018 8.0 (L)   Albumin (g/dL)  Date Value  01/25/2018 4.0    Assessment/Plan: Will replace potassium via fluids. Will start NS + 20 mEq KCl @ 100 ml/hr Will recheck w/ am labs.  Tobie Lords, PharmD, BCPS Clinical Pharmacist 04/01/2018

## 2018-04-01 NOTE — H&P (Signed)
[name] NAME: Russell Gomez    MR#:  [id number]  DATE OF BIRTH:  1960-12-08  DATE OF ADMISSION:  03/31/2018  PRIMARY CARE PHYSICIAN: Ria Bush, MD   REQUESTING/REFERRING PHYSICIAN:   CHIEF COMPLAINT:   Chief Complaint  Patient presents with  . Hypotension  . Near Syncope    HISTORY OF PRESENT ILLNESS: Russell Gomez  is a 57 y.o. male with a known history of diabetes type 1, hypertension, hyperlipidemia and GERD. Patient presented to emergency room for evaluation due to feeling weak, lightheaded and dehydrated while at work.  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  Patient denies any chest pain, shortness of breath, no N/V/D, no bleeding. At the arrival to emergency room, patient was noted with hypotension, BP 77/40 and tachycardia with heart rate in 120s.  Blood pressure and tachycardia improved with IV fluids in the emergency room. Blood test done in the emergency room are notable for elevated creatinine level at 3.77.  BUN is 45.  Most recent creatinine level, done a year ago was 0.9. Patient is admitted for evaluation and treatment.  PAST MEDICAL HISTORY:   Past Medical History:  Diagnosis Date  . Arthritis    on operative finger  . Dental crowns present   . Diabetes type 1, uncontrolled (HCC)    IDDM (Dr. Eddie Dibbles @ [facility])  . Esophageal reflux   . Goiter 04/2012   no current med, has yearly monitoring  . High cholesterol   . HTN (hypertension)    under control with meds, has been on med x 2 yr.    PAST SURGICAL HISTORY:  Past Surgical History:  Procedure Laterality Date  . DISTAL INTERPHALANGEAL JOINT FUSION Right 03/05/2014   Procedure: DEBRIDEMENT (DIP) DISTAL INTERPHALANGEAL RIGHT MIDDLE FINGER;  Surgeon: Wynonia Sours, MD;  Location: [facility];  Service: Orthopedics;  Laterality:  Right;  . KNEE ARTHROSCOPY    . MASS EXCISION Right 03/05/2014   Procedure: EXCISION CYST ;  Surgeon: Wynonia Sours, MD;  Location: [place];  Service: Orthopedics;  Laterality: Right;  ANESTHESIA: IV REGINAL FAB  . OPEN REDUCTION NASAL FRACTURE  12/26/2008   with closure of nasal lac.  Marland Kitchen ORIF DISTAL RADIUS FRACTURE Right 12/26/2008  . ORIF WRIST FRACTURE Left 05/12/2016   takedown of nonunion/malunion and OPEN REDUCTION INTERNAL FIXATION (ORIF) WRIST FRACTURE;  Surgeon: Corky Mull, MD  . PERCUTANEOUS PINNING Left 02/06/2013   Procedure: PINNING PIP OF THE LEFT MIDDLE FINGER ;  Surgeon: Wynonia Sours, MD;  Location: [place];  Service: Orthopedics;  Laterality: Left;  . SKIN GRAFT SPLIT THICKNESS LEG / FOOT Right 1985   thigh after trauma vs machine at work  . TRIGGER FINGER RELEASE  11/28/2012   Procedure: RELEASE TRIGGER FINGER/A-1 PULLEY;  Surgeon: Wynonia Sours, MD;  Laterality: Left;  EXCISION MASS LEFT RING FINGER, RELEASE A-1 PULLEY LEFT RING FINGER (ganglion cyst)    SOCIAL HISTORY:  Social History   Tobacco Use  . Smoking status: Current Every Day Smoker    Packs/day: 1.00    Years: 20.00    Pack years: 20.00    Types: Cigarettes  . Smokeless tobacco: Never Used  . Tobacco comment: smokes 1 pack a day  Substance Use Topics  . Alcohol use: Yes  Alcohol/week: 4.2 oz    Types: 7 Cans of beer per week    Comment:      FAMILY HISTORY:  Family History  Problem Relation Age of Onset  . Healthy Father   . Hypertension Mother   . Hyperlipidemia Mother   . Diabetes Maternal Uncle   . Coronary artery disease Maternal Uncle   . Stroke Maternal Uncle   . Diabetes Paternal Grandmother   . Diabetes Paternal Uncle     DRUG ALLERGIES:  Allergies  Allergen Reactions  . Insulin Aspart Other (See Comments)    CELLULITIS  . Spironolactone Nausea Only and Rash  . Hydrochlorothiazide Itching and Rash  . Morphine Other (See Comments)    "MAKES ME  MEAN"    REVIEW OF SYSTEMS:   CONSTITUTIONAL: No fever, but patient complains of fatigue and generalized weakness.  EYES: No blurred or double vision.  EARS, NOSE, AND THROAT: No tinnitus or ear pain.  RESPIRATORY: No cough, shortness of breath, wheezing or hemoptysis.  CARDIOVASCULAR: No chest pain, orthopnea, edema.  GASTROINTESTINAL: No nausea, vomiting, diarrhea or abdominal pain.  GENITOURINARY: No dysuria, hematuria.  Patient complains of poor urinary output in the past few days. ENDOCRINE: No polyuria, nocturia,  HEMATOLOGY: No bleeding SKIN: No rash or lesion. MUSCULOSKELETAL: No joint pain.   NEUROLOGIC: No focal weakness.  PSYCHIATRY: No anxiety or depression.   MEDICATIONS AT HOME:  Prior to Admission medications   Medication Sig Start Date End Date Taking? Authorizing Provider  amLODipine (NORVASC) 5 MG tablet Take 1 tablet (5 mg total) by mouth daily. 02/01/18  Yes Ria Bush, MD  aspirin (ASPIRIN EC) 81 MG EC tablet Take 81 mg by mouth daily. Swallow whole.   Yes [provider]  enalapril (VASOTEC) 20 MG tablet TAKE 1 TABLET BY MOUTH TWICE DAILY 10/24/17  Yes Ria Bush, MD  furosemide (LASIX) 20 MG tablet Take 1 tablet (20 mg total) by mouth daily. 02/03/18  Yes Ria Bush, MD  Insulin Glargine [facility]) 100 UNIT/ML SOPN Inject 18 Units into the skin at bedtime.  02/01/18  Yes Ria Bush, MD  insulin regular (HUMULIN R) 100 units/mL injection INJECT 3 TO 10 UNITS SUBCUTANEOUSLY BEFORE MEALS AS DIRECTED 01/11/18  Yes [provider]  metoprolol tartrate (LOPRESSOR) 25 MG tablet Take 1 tablet (25 mg total) by mouth 2 (two) times daily. 02/01/18  Yes Ria Bush, MD  omeprazole (PRILOSEC) 40 MG capsule Take 1 capsule (40 mg total) by mouth 2 (two) times daily. 02/01/18  Yes Ria Bush, MD  pravastatin (PRAVACHOL) 80 MG tablet TAKE 1 TABLET BY MOUTH DAILY 01/27/18  Yes Ria Bush, MD  glucose blood (ONE  TOUCH ULTRA TEST) test strip Use 6 (six) times daily 01/16/18   [provider]      PHYSICAL EXAMINATION:   VITAL SIGNS: Blood pressure 134/66, pulse 93, temperature 97.8 F (36.6 C), temperature source Oral, resp. rate 18, height 6\' 2"  (1.88 m), weight 112.4 kg (247 lb 12.8 oz), SpO2 100 %.  GENERAL:  57 y.o.-year-old patient lying in the bed with no acute distress.  Dry mucous membrane is noted. EYES: Pupils equal, round, reactive to light and accommodation. No scleral icterus. Extraocular muscles intact.  HEENT: Head atraumatic, normocephalic. Oropharynx and nasopharynx clear.  NECK:  Supple, no jugular venous distention. No thyroid enlargement, no tenderness.  LUNGS: Normal breath sounds bilaterally, no wheezing, rales,rhonchi or crepitation. No use of accessory muscles of respiration.  CARDIOVASCULAR: S1, S2 normal. No S3/S4.  ABDOMEN:  Soft, nontender, nondistended. Bowel sounds present. No organomegaly or mass.  EXTREMITIES: No pedal edema, cyanosis, or clubbing.  NEUROLOGIC: Cranial nerves II through XII are intact. Muscle strength 5/5 in all extremities. Sensation intact. Gait not checked.  PSYCHIATRIC: The patient is alert and oriented x 3.  SKIN: No obvious rash, lesion, or ulcer.   LABORATORY PANEL:   CBC Recent Labs  Lab 03/31/18 2132 04/01/18 0428  WBC 10.7* 7.3  HGB 15.3 13.5  HCT 43.4 38.6*  PLT 353 280  MCV 96.7 98.1  MCH 34.1* 34.3*  MCHC 35.2 35.0  RDW 14.2 14.0   ------------------------------------------------------------------------------------------------------------------  Chemistries  Recent Labs  Lab 03/31/18 2132 04/01/18 0428  NA 140 137  K 3.2* 3.4*  CL 99* 103  CO2 24 22  GLUCOSE 80 152*  BUN 45* 41*  CREATININE 3.77* 2.44*  CALCIUM 9.3 8.0*   ------------------------------------------------------------------------------------------------------------------ estimated creatinine clearance is 45.6 mL/min (A) (by C-G formula  based on SCr of 2.44 mg/dL (H)). ------------------------------------------------------------------------------------------------------------------ No results for input(s): TSH, T4TOTAL, T3FREE, THYROIDAB in the last 72 hours.  Invalid input(s): FREET3   Coagulation profile No results for input(s): INR, PROTIME in the last 168 hours. ------------------------------------------------------------------------------------------------------------------- No results for input(s): DDIMER in the last 72 hours. -------------------------------------------------------------------------------------------------------------------  Cardiac Enzymes Recent Labs  Lab 03/31/18 2132  TROPONINI <0.03   ------------------------------------------------------------------------------------------------------------------ Invalid input(s): POCBNP  ---------------------------------------------------------------------------------------------------------------  Urinalysis    Component Value Date/Time   COLORURINE YELLOW (A) 03/31/2018 2332   APPEARANCEUR HAZY (A) 03/31/2018 2332   LABSPEC 1.015 03/31/2018 2332   PHURINE 5.0 03/31/2018 2332   GLUCOSEU NEGATIVE 03/31/2018 2332   HGBUR SMALL (A) 03/31/2018 2332   BILIRUBINUR NEGATIVE 03/31/2018 2332   KETONESUR 5 (A) 03/31/2018 2332   PROTEINUR 30 (A) 03/31/2018 2332   UROBILINOGEN 0.2 01/14/2010 0100   NITRITE NEGATIVE 03/31/2018 2332   LEUKOCYTESUR NEGATIVE 03/31/2018 2332     RADIOLOGY: No results found.  EKG: Orders placed or performed during the hospital encounter of 03/31/18  . EKG 12-Lead  . EKG 12-Lead  . ED EKG  . ED EKG    IMPRESSION AND PLAN:  1.  Acute renal failure, likely multi-factorial, secondary to dehydration in a patient with diabetes and hypertension.  We will start IV fluids and encourage patient to increase p.o. fluid intake.  Will check UA and renal ultrasound.  Will consult nephrology for further evaluation and treatment.   Continue to monitor kidney function closely and avoid nephrotoxic medications. 2.  Hypotension, likely secondary to dehydration, improved with IV fluids.  Continue to monitor blood pressure closely and hold BP meds for now. 3.  Diabetes type 1.  Will monitor blood sugars before meals at bedtime and treat with insulin.  Low-carb diet. 4.  Hyperlipidemia, continue statin therapy.  All the records are reviewed and case discussed with ED provider. Management plans discussed with the patient, family and they are in agreement.  CODE STATUS: FULL    Code Status Orders  (From admission, onward)        Start     Ordered   04/01/18 0151  Full code  Continuous     04/01/18 0151    Code Status History    Date Active Date Inactive Code Status Order ID Comments User Context   05/12/2016 1207 05/12/2016 1546 Full Code [id number]  Poggi, Marshall Cork, MD Inpatient       TOTAL TIME TAKING CARE OF THIS PATIENT: 45 minutes.    Amelia Jo M.D on 04/01/2018 at 5:02 AM  Between 7am to 6pm - Pager - [phone]  After 6pm go to [email or web address] - password [email or web address] [facility]  [facility]  Office  [phone]  CC: Primary care physician; Ria Bush, MD

## 2018-04-01 NOTE — Progress Notes (Signed)
Admitted this morning for near syncope found to have acute renal failure, started on IV fluids, and improved from 3.77 2.4.  Patient is adamant that he wants to go at 66 x 6 p.m. today.  He wants to go home by 6 PM today.  Recheck creatinine around 4 PM.  Continue IV fluids potassium at 100 mL number, repeat labs at 4 PM, likely discharge later today, patient advised to stop Lasix, lisinopril tomorrow and resume Lasix and lisinopril from Monday.

## 2018-04-01 NOTE — Discharge Instructions (Signed)
Hold Lasix, lisinopril tomorrow, resume from Monday

## 2018-04-01 NOTE — Progress Notes (Signed)
Pt being discharged home, discharge instructions reviewed with pt, states understanding, refuses wheelchair at discharge, pt with no complaints

## 2018-04-03 NOTE — Discharge Summary (Signed)
Russell Gomez, is a 57 y.o. male  DOB 12/29/1960  MRN [id number].  Admission date:  03/31/2018  Admitting Physician  Amelia Jo, MD  Discharge Date:  04/01/2018   Primary MD  Ria Bush, MD  Recommendations for primary care physician for things to follow:  Follow-up with PCP in 5 days  Admission Diagnosis  Dehydration [E86.0] AKI (acute kidney injury) ([place]) [N17.9] Near syncope [R55]   Discharge Diagnosis  Dehydration [E86.0] AKI (acute kidney injury) ([place]) [N17.9] Near syncope [R55]  Active Problems:   ARF (acute renal failure) (HCC)      Past Medical History:  Diagnosis Date  . Arthritis    on operative finger  . Dental crowns present   . Diabetes type 1, uncontrolled (HCC)    IDDM (Dr. Eddie Dibbles @ [facility])  . Esophageal reflux   . Goiter 04/2012   no current med, has yearly monitoring  . High cholesterol   . HTN (hypertension)    under control with meds, has been on med x 2 yr.    Past Surgical History:  Procedure Laterality Date  . DISTAL INTERPHALANGEAL JOINT FUSION Right 03/05/2014   Procedure: DEBRIDEMENT (DIP) DISTAL INTERPHALANGEAL RIGHT MIDDLE FINGER;  Surgeon: Wynonia Sours, MD;  Location: [place];  Service: Orthopedics;  Laterality: Right;  . KNEE ARTHROSCOPY    . MASS EXCISION Right 03/05/2014   Procedure: EXCISION CYST ;  Surgeon: Wynonia Sours, MD;  Location: [place];  Service: Orthopedics;  Laterality: Right;  ANESTHESIA: IV REGINAL FAB  . OPEN REDUCTION NASAL FRACTURE  12/26/2008   with closure of nasal lac.  Marland Kitchen ORIF DISTAL RADIUS FRACTURE Right 12/26/2008  . ORIF WRIST FRACTURE Left 05/12/2016   takedown of nonunion/malunion and OPEN REDUCTION INTERNAL FIXATION (ORIF) WRIST FRACTURE;  Surgeon: Corky Mull, MD  . PERCUTANEOUS PINNING Left 02/06/2013   Procedure:  PINNING PIP OF THE LEFT MIDDLE FINGER ;  Surgeon: Wynonia Sours, MD;  Location: [place];  Service: Orthopedics;  Laterality: Left;  . SKIN GRAFT SPLIT THICKNESS LEG / FOOT Right 1985   thigh after trauma vs machine at work  . TRIGGER FINGER RELEASE  11/28/2012   Procedure: RELEASE TRIGGER FINGER/A-1 PULLEY;  Surgeon: Wynonia Sours, MD;  Laterality: Left;  EXCISION MASS LEFT RING FINGER, RELEASE A-1 PULLEY LEFT RING FINGER (ganglion cyst)       History of present illness and  Hospital Course:     Kindly see H&P for history of present illness and admission details, please review complete Labs, Consult reports and Test reports for all details in brief  HPI  from the history and physical done on the day of admission 57 year old male with medical problems of diabetes mellitus type 2, hypertension came to [facility] for evaluation of presyncope.  Patient felt lightheaded, dehydrated, he works in environment with temperature up to [place].  Patient found to have hypotension, tachycardia with heart rate 120s, he also found to have acute renal failure with creatinine up to 3.77.   Hospital Course  #1 presyncope secondary to dehydration, admitted to medical floor, started on aggressive hydration. 2.  Acute renal failure secondary to severe dehydration: Patient Lasix, lisinopril are held, received aggressive IV fluids, patient creatinine decreased from 3.7-0.38.  Patient is discharged home, advised the patient to stop Lasix, lisinopril and follow-up with PCP in 5 days and have repeat kidney function.  Patient renal ultrasound also is unremarkable. 3.  Diabetes mellitus type  2: Initially Lantus was not given when he is admitted because of renal failure, resume Lantus, Humulin at discharge.   Discharge Condition: Stable   Follow UP  Follow-up Information    Ria Bush, MD. Schedule an appointment as soon as possible for a visit in 1 week(s).   Specialty:  Family  Medicine Why:  And can make appointment with PCP in 1 week Contact information: [place] [address] [phone]             Discharge Instructions  and  Discharge Medications      Allergies as of 04/01/2018      Reactions   Insulin Aspart Other (See Comments)   CELLULITIS   Spironolactone Nausea Only, Rash   Hydrochlorothiazide Itching, Rash   Morphine Other (See Comments)   "MAKES ME MEAN"      Medication List    STOP taking these medications   enalapril 20 MG tablet Commonly known as:  VASOTEC   furosemide 20 MG tablet Commonly known as:  LASIX     TAKE these medications   amLODipine 5 MG tablet Commonly known as:  NORVASC Take 1 tablet (5 mg total) by mouth daily.   aspirin EC 81 MG EC tablet Generic drug:  aspirin Take 81 mg by mouth daily. Swallow whole.   BASAGLAR KWIKPEN 100 UNIT/ML Sopn Inject 18 Units into the skin at bedtime.   HUMULIN R 100 units/mL injection Generic drug:  insulin regular INJECT 3 TO 10 UNITS SUBCUTANEOUSLY BEFORE MEALS AS DIRECTED   metoprolol tartrate 25 MG tablet Commonly known as:  LOPRESSOR Take 1 tablet (25 mg total) by mouth 2 (two) times daily.   omeprazole 40 MG capsule Commonly known as:  PRILOSEC Take 1 capsule (40 mg total) by mouth 2 (two) times daily.   ONE TOUCH ULTRA TEST test strip Generic drug:  glucose blood Use 6 (six) times daily   pravastatin 80 MG tablet Commonly known as:  PRAVACHOL TAKE 1 TABLET BY MOUTH DAILY         Diet and Activity recommendation: See Discharge Instructions above   Consults obtained -nephrology  Major procedures and Radiology Reports - PLEASE review detailed and final reports for all details, in brief -     US Renal  Result Date: 04/01/2018 CLINICAL DATA:  Acute renal failure EXAM: RENAL / URINARY TRACT ULTRASOUND COMPLETE COMPARISON:  01/14/2010 CT FINDINGS: Right Kidney: Length: 12.5 cm. Echogenicity within normal limits. No mass  or hydronephrosis visualized. Left Kidney: Length: 12.8 cm. Echogenicity within normal limits. No mass or hydronephrosis visualized. Bladder: Appears normal for degree of bladder distention. IMPRESSION: Normal renal ultrasound.  No explanation for renal failure. Electronically Signed   By: Abigail Miyamoto M.D.   On: 04/01/2018 08:04    Micro Results     No results found for this or any previous visit (from the past 240 hour(s)).     Today   Subjective:   Russell Gomez today has no headache,no chest abdominal pain,no new weakness tingling or numbness, feels much better wants to go home today.   Objective:   Blood pressure (!) 121/56, pulse 97, temperature 98.2 F (36.8 C), temperature source Oral, resp. rate 20, height 6\' 2"  (1.88 m), weight 112.4 kg (247 lb 12.8 oz), SpO2 95 %.  No intake or output data in the 24 hours ending 04/03/18 1045  Exam Awake Alert, Oriented x 3, No new F.N deficits, Normal affect [place].AT,PERRAL Supple Neck,No JVD, No cervical  lymphadenopathy appriciated.  Symmetrical Chest wall movement, Good air movement bilaterally, CTAB RRR,No Gallops,Rubs or new Murmurs, No Parasternal Heave +ve B.Sounds, Abd Soft, Non tender, No organomegaly appriciated, No rebound -guarding or rigidity. No Cyanosis, Clubbing or edema, No new Rash or bruise  Data Review   CBC w Diff:  Lab Results  Component Value Date   WBC 7.3 04/01/2018   HGB 13.5 04/01/2018   HCT 38.6 (L) 04/01/2018   PLT 280 04/01/2018   LYMPHOPCT 26.7 11/23/2016   MONOPCT 7.6 11/23/2016   EOSPCT 2.9 11/23/2016   BASOPCT 0.2 11/23/2016    CMP:  Lab Results  Component Value Date   NA 134 (L) 04/01/2018   NA 135 (A) 04/03/2013   K 4.1 04/01/2018   CL 106 04/01/2018   CO2 23 04/01/2018   BUN 29 (H) 04/01/2018   CREATININE 1.38 (H) 04/01/2018   CREATININE 0.7 04/03/2013   PROT 6.2 01/25/2018   ALBUMIN 4.0 01/25/2018   BILITOT 0.1 repeated (L) 01/25/2018   ALKPHOS 82 01/25/2018   AST 25  01/25/2018   ALT 28 01/25/2018  .   Total Time in preparing paper work, data evaluation and todays exam - 35 minutes  Epifanio Lesches M.D on 04/01/2018 at 10:45 AM    Note: This dictation was prepared with Dragon dictation along with smaller phrase technology. Any transcriptional errors that result from this process are unintentional.

## 2018-04-04 ENCOUNTER — Telehealth: Payer: Self-pay

## 2018-04-04 ENCOUNTER — Ambulatory Visit: Payer: Self-pay | Admitting: Family Medicine

## 2018-04-04 ENCOUNTER — Encounter: Payer: Self-pay | Admitting: Family Medicine

## 2018-04-04 VITALS — BP 134/70 | HR 80 | Temp 98.5°F | Ht 72.0 in | Wt 250.0 lb

## 2018-04-04 DIAGNOSIS — I10 Essential (primary) hypertension: Secondary | ICD-10-CM

## 2018-04-04 DIAGNOSIS — F17200 Nicotine dependence, unspecified, uncomplicated: Secondary | ICD-10-CM

## 2018-04-04 DIAGNOSIS — N179 Acute kidney failure, unspecified: Secondary | ICD-10-CM

## 2018-04-04 NOTE — Telephone Encounter (Signed)
Flagged on EMMI report for not having a follow up scheduled.  Per chart, patient has a follow up appointment today (04/04/18) with Dr. Danise Mina at 3:45pm.  First attempt to reach patient done 5/28 at 1:25pm, however unable to reach.  Left message encouraging callback.  Will attempt at later time.

## 2018-04-04 NOTE — Telephone Encounter (Signed)
Patient returned call. He is aware of appointment this afternoon with his PCP.  No further questions at this time.  I thanked him for his time and informed him that he would receive one more automated call in the next few days as a final check-in.

## 2018-04-04 NOTE — Patient Instructions (Addendum)
Labs today Increase water.  Stay off blood pressure medicines for now. But monitor blood pressure at home - and let me know if trending >140/90 to slowly restart blood pressure medicines as needed.  Decrease aspirin to 3 times a week. Return in 1 month for f/u visit

## 2018-04-04 NOTE — Assessment & Plan Note (Signed)
Recent hospitalization for acute renal failure after episode of severe heat, dehydration at work in setting of several antihypertensives including lasix and enalapril.  Cr on admission 3.7, on discharge 1.3 after 7 bags of fluids. Will check renal panel today ensure ongoing improvement. Reviewed increased risk of recurrent kidney injury after initial episode, reviewed importance of good hydration status for safe use of current antihypertensives.  Has quit this job, looking for one with safer work environment for him.

## 2018-04-04 NOTE — Assessment & Plan Note (Addendum)
Stable off medications at this time. Stay off but continue to monitor and notify me if starts trending up to restart and titrate antihypertensive regimen as needed.

## 2018-04-04 NOTE — Progress Notes (Signed)
BP 134/70 (BP Location: Left Arm, Patient Position: Sitting, Cuff Size: Large)   Pulse 80   Temp 98.5 F (36.9 C) (Oral)   Ht 6' (1.829 m)   Wt 250 lb (113.4 kg)   SpO2 97%   BMI 33.91 kg/m    CC: hosp f/u visit Subjective:    Patient ID: Russell Gomez, male    DOB: 01/25/1961, 57 y.o.   MRN: [id number]  HPI: Russell Gomez is a 57 y.o. male presenting on 04/04/2018 for Hospitalization Follow-up (Admitted to [facility] on 03/31/18, primary dx acute kidney injury.) and Bleeding/Bruising (C/o easily brusing on bilateral UEs.)   Recent hospitalization for acute kidney injury after episode of presyncope at work, found to have dehydration from high heat work environment. Records reviewed. Cr up to 3.77. BP at ER 70/45. Treated with aggressive IVF rehydration. Lasix, ACEI and amlodipine were held. D/C creatinine down to 1.38. Saw nephrology - renal US was ok.   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   Prior BP regimen was enalapril 20mg  bid, metoprolol 25mg  bid, amlodipine 5mg  daily and lasix 20mg  daily. Currently off all medications. Staying fatigued.   Stopped aspirin 2 wks ago.  Continued 1 ppd smoker.   TCM hosp f/u phone call not performed - but seen next business day after discharge. Admission date:  03/31/2018   Discharge Date:  04/01/2018   Discharge Diagnosis  Dehydration [E86.0] AKI (acute kidney injury) ([place]) [N17.9] Near syncope [R55]  Discharge Condition: Stable  Recommendations for primary care physician for things to follow:  Follow-up with PCP in 5 days  Relevant past medical, surgical, family and social history reviewed and updated as indicated. Interim medical history since our last visit reviewed. Allergies and medications reviewed and updated. Outpatient Medications Prior to Visit  Medication Sig Dispense Refill  . [START ON 04/05/2018] aspirin (ASPIRIN EC) 81 MG EC tablet Take 1 tablet (81 mg total) by mouth every  Monday, Wednesday, and Friday. Swallow whole. 30 tablet   . glucose blood (ONE TOUCH ULTRA TEST) test strip Use 6 (six) times daily    . Insulin Glargine (BASAGLAR KWIKPEN) 100 UNIT/ML SOPN Inject 18 Units into the skin at bedtime.     . insulin regular (HUMULIN R) 100 units/mL injection INJECT 3 TO 10 UNITS SUBCUTANEOUSLY BEFORE MEALS AS DIRECTED    . omeprazole (PRILOSEC) 40 MG capsule Take 1 capsule (40 mg total) by mouth 2 (two) times daily. 180 capsule 3  . pravastatin (PRAVACHOL) 80 MG tablet TAKE 1 TABLET BY MOUTH DAILY 90 tablet 0  . aspirin (ASPIRIN EC) 81 MG EC tablet Take 81 mg by mouth daily. Swallow whole.    Marland Kitchen amLODipine (NORVASC) 5 MG tablet Take 1 tablet (5 mg total) by mouth daily. 90 tablet 3  . metoprolol tartrate (LOPRESSOR) 25 MG tablet Take 1 tablet (25 mg total) by mouth 2 (two) times daily. 180 tablet 3   No facility-administered medications prior to visit.      Per HPI unless specifically indicated in ROS section below Review of Systems     Objective:    BP 134/70 (BP Location: Left Arm, Patient Position: Sitting, Cuff Size: Large)   Pulse 80   Temp 98.5 F (36.9 C) (Oral)   Ht 6' (1.829 m)   Wt 250 lb (113.4 kg)   SpO2 97%   BMI 33.91 kg/m   Wt Readings from Last 3 Encounters:  04/04/18 250  lb (113.4 kg)  04/01/18 247 lb 12.8 oz (112.4 kg)  02/01/18 250 lb 8 oz (113.6 kg)    Physical Exam  Constitutional: He appears well-developed and well-nourished. No distress.  HENT:  Mouth/Throat: Oropharynx is clear and moist. No oropharyngeal exudate.  Cardiovascular: Normal rate, regular rhythm and normal heart sounds.  No murmur heard. Pulmonary/Chest: Effort normal and breath sounds normal. No respiratory distress. He has no wheezes. He has no rales.  Musculoskeletal: He exhibits no edema.  Skin: Skin is warm and dry.  Multiple ecchymoses BUE  Vitals reviewed.  Lab Results  Component Value Date   HGBA1C 6.3 01/25/2018    Lab Results  Component Value  Date   CREATININE 1.38 (H) 04/01/2018   BUN 29 (H) 04/01/2018   NA 134 (L) 04/01/2018   K 4.1 04/01/2018   CL 106 04/01/2018   CO2 23 04/01/2018       Assessment & Plan:  Due to increased ecchymoses - will decrease aspirin to three times a week.  Problem List Items Addressed This Visit    ARF (acute renal failure) ([place]) - Primary    Recent hospitalization for acute renal failure after episode of severe heat, dehydration at work in setting of several antihypertensives including lasix and enalapril.  Cr on admission 3.7, on discharge 1.3 after 7 bags of fluids. Will check renal panel today ensure ongoing improvement. Reviewed increased risk of recurrent kidney injury after initial episode, reviewed importance of good hydration status for safe use of current antihypertensives.  Has quit this job, looking for one with safer work environment for him.       Relevant Orders   Renal function panel   Essential hypertension    Stable off medications at this time. Stay off but continue to monitor and notify me if starts trending up to restart and titrate antihypertensive regimen as needed.       Relevant Medications   aspirin (ASPIRIN EC) 81 MG EC tablet (Start on 04/05/2018)   TOBACCO ABUSE    Continue to encourage cessation. Down to 1/2 ppd. Motivated to quit.           No orders of the defined types were placed in this encounter.  Orders Placed This Encounter  Procedures  . Renal function panel    Follow up plan: Return in about 1 month (around 05/02/2018), or if symptoms worsen or fail to improve, for follow up visit.  Ria Bush, MD

## 2018-04-04 NOTE — Assessment & Plan Note (Signed)
Continue to encourage cessation. Down to 1/2 ppd. Motivated to quit.

## 2018-04-05 ENCOUNTER — Telehealth: Payer: Self-pay

## 2018-04-05 LAB — RENAL FUNCTION PANEL
Albumin: 3.6 g/dL (ref 3.5–5.2)
BUN: 11 mg/dL (ref 6–23)
CO2: 29 mEq/L (ref 19–32)
CREATININE: 0.69 mg/dL (ref 0.40–1.50)
Calcium: 8.9 mg/dL (ref 8.4–10.5)
Chloride: 101 mEq/L (ref 96–112)
GFR: 126.29 mL/min (ref >60.00)
GLUCOSE: 127 mg/dL — AB (ref 70–99)
PHOSPHORUS: 2.5 mg/dL (ref 2.3–4.6)
Potassium: 4.3 mEq/L (ref 3.5–5.1)
SODIUM: 138 meq/L (ref 135–145)

## 2018-04-05 NOTE — Telephone Encounter (Signed)
Spoke with patient. Understanding verbalized

## 2018-04-05 NOTE — Telephone Encounter (Signed)
Copied from [place] [phone]. Topic: Quick Communication - Lab Results >> Apr 05, 2018  1:39 PM Scherrie Gerlach wrote: Pt calling back for renal function test done yesterday.  Pt is anxious to know

## 2018-04-05 NOTE — Telephone Encounter (Signed)
See result note.  

## 2018-04-07 ENCOUNTER — Telehealth: Payer: Self-pay

## 2018-04-07 ENCOUNTER — Telehealth: Payer: Self-pay | Admitting: *Deleted

## 2018-04-07 MED ORDER — ENALAPRIL MALEATE 20 MG PO TABS: 20.0000 mg | ORAL_TABLET | Freq: Every day | ORAL | 3 refills | Status: DC

## 2018-04-07 NOTE — Telephone Encounter (Signed)
Pt does not need any enalapril yet. Pt has new pharm [facility] [address] in [place]

## 2018-04-07 NOTE — Telephone Encounter (Signed)
Left message for pt to call back.   Need to relay Dr. G's message and instructions.  

## 2018-04-07 NOTE — Telephone Encounter (Signed)
Copied from [place] [id number]. Topic: Inquiry >> Apr 07, 2018 12:25 PM Scherrie Gerlach wrote: Reason for CRM: pt states he was to let Dr Darnell Level know if his bp readings increased.   Last night at 8 pm it was 185/74  p 70 12:30 am  160/90  p 70 12 today when he woke up:  164/91    p 83  Please advise

## 2018-04-07 NOTE — Telephone Encounter (Signed)
Spoke with pt informing him we received his message about the enalapril and new pharmacy.  Clarified he needs to change pharmacy to [facility] in [place].  Requests that [facility] be removed from his chart.  Updated pt's pharmacy list.

## 2018-04-07 NOTE — Telephone Encounter (Signed)
Ok. Let's restart enalapril at lower dose - 20mg  once daily. Sent to pharmacy. Update [place] with BP readings over the next week.

## 2018-04-07 NOTE — Telephone Encounter (Signed)
EMMI flagged call for patient responding as loss of interest. CSW attempted to call but had to leave a voicemail.   Russell Gomez, [place]

## 2018-04-11 MED ORDER — METOPROLOL TARTRATE 25 MG PO TABS: 25.0000 mg | ORAL_TABLET | Freq: Two times a day (BID) | ORAL | 3 refills | Status: DC

## 2018-04-11 NOTE — Addendum Note (Signed)
Addended by: Ria Bush on: 04/11/2018 05:09 PM   Modules accepted: Orders

## 2018-04-11 NOTE — Telephone Encounter (Signed)
ok le's restart metoprolol 25mg  bid. Sent to pharmacy.  Update [place] with BP readings in 1 week.

## 2018-04-11 NOTE — Telephone Encounter (Signed)
Pt calling to update bp readings 6/2  1:40 pm    144/80  p 100 6/2   7 pm         158/90  p 91 6/3  12:55 pm   162/88   p 89 6/3   10:45 pm  173/81  p  90 6/4    12:30 pm  164/87  p 79 6/4    2:45 pm     156/85  p 99   All readings are after taking the one tablet

## 2018-04-12 NOTE — Telephone Encounter (Signed)
Spoke with pt relaying instructions and message per Dr. Darnell Level.  Pt verbalizes understanding and states he has plenty of the metoprolol at home.  Suggested he call the pharmacy and ask them to just put the refill on hold.

## 2018-04-19 ENCOUNTER — Telehealth: Payer: Self-pay

## 2018-04-19 ENCOUNTER — Telehealth: Payer: Self-pay | Admitting: Family Medicine

## 2018-04-19 MED ORDER — AMLODIPINE BESYLATE 5 MG PO TABS
5.0000 mg | ORAL_TABLET | Freq: Every day | ORAL | 3 refills | Status: DC
Start: 2018-04-19 — End: 2018-06-25

## 2018-04-19 NOTE — Telephone Encounter (Signed)
Copied from [place] [phone]. Topic: Quick Communication - See Telephone Encounter >> Apr 07, 2018  6:29 PM Brenton Grills, CMA wrote: CRM for notification. See Telephone encounter for: 04/07/18.  PEC may relay Dr. Synthia Innocent message:  Let's restart enalapril at lower dose - 20mg  once daily. Sent to pharmacy. Update [place] with BP readings over the next week.  >> Apr 19, 2018  4:38 PM Pilar Grammes F wrote: Update on BP Readings: 6/7         160/100 at 1:20pm,  151/88 at 6:00pm 6/8          156/85 at 8:00pm 6/9          157/95 at 11:00pm 6/10        160/91 at 4:30pm 6/11        164/96 at 6:30am 6/12        164/92 at 1:00pm

## 2018-04-19 NOTE — Telephone Encounter (Signed)
Prior BP regimen was enalapril 20mg  bid, metoprolol 25mg  bid, amlodipine 5mg  daily and lasix 20mg  daily.   plz verify current BP regimen is enalapril 20mg  daily and metoprolol 25mg  bid.   If this is correct, let's continue enalapril once daily and metoprolol twice daily and restart amlodipine 5mg  once daily.   Update with BP readings in 1 week.

## 2018-04-19 NOTE — Telephone Encounter (Signed)
Copied from [place] [id number]. Topic: Quick Communication - See Telephone Encounter >> Apr 19, 2018  4:26 PM Cleaster Corin, NT wrote: CRM for notification. See Telephone encounter for: 04/19/18.  Pt. Wife calling with new insurance info. Would like for nurse to give her a callback Cecille Rubin [phone]

## 2018-04-19 NOTE — Telephone Encounter (Signed)
New BP readings

## 2018-04-19 NOTE — Telephone Encounter (Signed)
Returned call to Cecille Rubin, states the insurance issue has already been resolved but expressed her thanks for a call back.  Also, asked that she have pt sign a new dpr next time he is in the office.  Says she will relay the message.

## 2018-04-19 NOTE — Telephone Encounter (Signed)
Spoke with pt asking about current BP regimen. Confirms he is taking enalapril 20 mg daily and metoprolol 25 mg BID.   Relayed instructions per Dr. Darnell Level about restarting amlodipine and calling back with BP readings in 1 wk.  Pt verbalizes understanding.

## 2018-04-21 ENCOUNTER — Telehealth: Payer: Self-pay | Admitting: Family Medicine

## 2018-04-21 DIAGNOSIS — E1069 Type 1 diabetes mellitus with other specified complication: Secondary | ICD-10-CM

## 2018-04-21 MED ORDER — INSULIN REGULAR HUMAN 100 UNIT/ML IJ SOLN: 3.0000 [IU] | Freq: Three times a day (TID) | INTRAMUSCULAR | 3 refills | Status: DC

## 2018-04-21 NOTE — Telephone Encounter (Signed)
Insulin refilled. Referral to new endo placed.

## 2018-04-21 NOTE — Telephone Encounter (Signed)
Copied from [place] [phone]. Topic: Quick Communication - Rx Refill/Question >> Apr 21, 2018  8:18 AM Margot Ables wrote: Medication: insulin regular (HUMULIN R) 100 units/mL injection - 3-10 units prior to meals - pts former Endo left the practice. Pt is asking if Dr. Darnell Level can send in medication for him. He is guessing he will need a new endo.  Has the patient contacted their pharmacy? Yes - no refills and provider left practice Preferred Pharmacy (with phone number or street name): [facility] [place], [place] - [address] [phone] (Phone) [phone] (Fax)

## 2018-04-24 ENCOUNTER — Telehealth: Payer: Self-pay | Admitting: Family Medicine

## 2018-04-24 NOTE — Telephone Encounter (Signed)
Copied from [place] [id number]. Topic: Quick Communication - Rx Refill/Question >> Apr 24, 2018  3:25 PM Nils Flack, Marland Kitchen wrote: Medication: omeprazole (PRILOSEC) 40 MG capsule  Has the patient contacted their pharmacy? No. (Agent: If no, request that the patient contact the pharmacy for the refill.) (Agent: If yes, when and what did the pharmacy advise?) new pharm  Preferred Pharmacy (with phone number or street name[facility] [address]    Agent: Please be advised that RX refills may take up to 3 business days. We ask that you follow-up with your pharmacy.

## 2018-04-24 NOTE — Telephone Encounter (Signed)
Copied from [place] [phone]. Topic: Referral - Status >> Apr 24, 2018  5:13 PM Percell Belt A wrote: Reason for CRM:   Pt called in stated that he made an appt with [facility]  for Endocrinology.  He made an appt for Aug 8th at 8:15  [address] [phone]

## 2018-04-25 ENCOUNTER — Other Ambulatory Visit: Payer: Self-pay

## 2018-04-25 MED ORDER — OMEPRAZOLE 40 MG PO CPDR: 40.0000 mg | DELAYED_RELEASE_CAPSULE | Freq: Two times a day (BID) | ORAL | 3 refills | Status: DC

## 2018-04-25 NOTE — Telephone Encounter (Signed)
Appt confirmed with Dr Judithann Sheen at [facility] Endocrinology for August 8th at 8:15am.

## 2018-04-25 NOTE — Telephone Encounter (Signed)
Spoke with the patient. He would like to try to see Dr Ladell Pier at [facility] Endocrinology.

## 2018-04-27 LAB — HIV ANTIBODY (ROUTINE TESTING W REFLEX): HIV SCREEN 4TH GENERATION: NONREACTIVE

## 2018-04-28 ENCOUNTER — Telehealth: Payer: Self-pay

## 2018-04-28 MED ORDER — METOPROLOL TARTRATE 50 MG PO TABS: 50.0000 mg | ORAL_TABLET | Freq: Two times a day (BID) | ORAL | 6 refills | Status: DC

## 2018-04-28 NOTE — Telephone Encounter (Signed)
Copied from [place] [phone]. Topic: General - Other >> Apr 28, 2018  1:50 PM Williams-Neal, Sade R wrote: Pt called in to give his bp readings for Dr Darnell Level.  6-15: 172/97 pulse 103 8:45pm 6-16: 157/90 pulse 91 1:00am 6-17: 164/95 pulse 102 12:30pm 6-17: 161/85 pulse 106 9:30pm 6-18: 172/98 pulse 93 3:30pm 6-20: 166/93 pulse 94 7:00pm 6-21: 168/99 pulse 82 1:30p

## 2018-04-28 NOTE — Telephone Encounter (Signed)
Spoke with pt relaying instructions and message per Dr. G.  Pt verbalizes understanding. 

## 2018-04-28 NOTE — Telephone Encounter (Signed)
(  Prior BP regimen was enalapril 20mg  bid, metoprolol 25mg  bid, amlodipine 5mg  daily and lasix 20mg  daily.)  Current BP regimen is enalapril 20mg  daily and metoprolol 25mg  bid and amlodipine 5mg  daily.   Let's increase metoprolol to 50mg  twice daily (double up on med). New dose at pharmacy. Update with BP readings in 1 week.

## 2018-04-28 NOTE — Telephone Encounter (Signed)
Please see 04/19/18 phone note.

## 2018-05-08 ENCOUNTER — Encounter: Payer: Self-pay | Admitting: *Deleted

## 2018-05-09 ENCOUNTER — Encounter: Admission: RE | Payer: Self-pay | Source: Ambulatory Visit

## 2018-05-09 ENCOUNTER — Ambulatory Visit: Admission: RE | Admit: 2018-05-09 | Payer: 59 | Source: Ambulatory Visit | Admitting: Gastroenterology

## 2018-05-09 SURGERY — COLONOSCOPY WITH PROPOFOL
Anesthesia: General

## 2018-05-12 ENCOUNTER — Telehealth: Payer: Self-pay | Admitting: Family Medicine

## 2018-05-12 MED ORDER — PRAVASTATIN SODIUM 80 MG PO TABS: 80.0000 mg | ORAL_TABLET | Freq: Every day | ORAL | 1 refills | Status: DC

## 2018-05-12 NOTE — Telephone Encounter (Signed)
Copied from [place] [phone]. Topic: Quick Communication - Rx Refill/Question >> May 12, 2018 12:56 PM Neva Seat wrote: pravastatin (PRAVACHOL) 80 MG tablet  Needing new refills - pt changed insurances.  [facility] [place], [place] - [address] [place] [address] Phone: [phone] Fax: [phone]

## 2018-05-18 ENCOUNTER — Telehealth: Payer: Self-pay

## 2018-05-18 NOTE — Telephone Encounter (Signed)
Copied from [place] [phone]. Topic: General - Other >> May 18, 2018  1:25 PM Carolyn Stare wrote:  Call with bp readings  05/10/18     124/76 pulse 81 2:30pm 05/13/18     158/93 pulse 74 2:00pm 05/14/18     136/74 pulse 89 8:45pm 05/15/18     156/91 pulse 94 9:15 pm 05/17/18   136/79 pulse 83 1:00 pm  05/18/18   112/69 pulse 80 1:15pm

## 2018-05-18 NOTE — Telephone Encounter (Signed)
Please see 04/28/18 phone note.

## 2018-05-18 NOTE — Telephone Encounter (Signed)
These readings are much better. How is he feeling? Continue current regimen  Enalapril 20mg  daily, metoprolol 50mg  bid and amlodipine 5mg  daily

## 2018-05-19 NOTE — Telephone Encounter (Signed)
Left message for pt to call back.  Need to relay Dr. Synthia Innocent message, instructions and document response to his question.

## 2018-05-19 NOTE — Telephone Encounter (Signed)
Patient called back and given message by Dr. Danise Mina note on 05/18/18, patient verbalized understanding and says he feels much better.

## 2018-05-19 NOTE — Telephone Encounter (Signed)
Noted  

## 2018-06-25 ENCOUNTER — Other Ambulatory Visit: Payer: Self-pay | Admitting: Family Medicine

## 2018-06-26 ENCOUNTER — Other Ambulatory Visit: Payer: Self-pay | Admitting: Family Medicine

## 2018-06-26 MED ORDER — ENALAPRIL MALEATE 20 MG PO TABS: 20.0000 mg | ORAL_TABLET | Freq: Every day | ORAL | 0 refills | Status: DC

## 2018-06-26 NOTE — Telephone Encounter (Signed)
Copied from [place] [phone]. Topic: Quick Communication - Rx Refill/Question >> Jun 26, 2018 11:50 AM Synthia Innocent wrote: Medication: enalapril (VASOTEC) 20 MG tablet, pharmacy lost prior RX from May, requesting 90 day supply  Has the patient contacted their pharmacy? Yes.   (Agent: If no, request that the patient contact the pharmacy for the refill.) (Agent: If yes, when and what did the pharmacy advise?)  Preferred Pharmacy (with phone number or street name): [facility] [address]: Please be advised that RX refills may take up to 3 business days. We ask that you follow-up with your pharmacy.

## 2018-06-26 NOTE — Telephone Encounter (Signed)
LOV 04/04/18 Dr. Danise Mina Last refill 04/07/18

## 2018-09-02 ENCOUNTER — Other Ambulatory Visit: Payer: Self-pay | Admitting: Family Medicine

## 2018-09-04 ENCOUNTER — Other Ambulatory Visit: Payer: Self-pay

## 2018-09-04 MED ORDER — AMLODIPINE BESYLATE 5 MG PO TABS: ORAL_TABLET | ORAL | 1 refills | Status: DC

## 2018-09-04 NOTE — Telephone Encounter (Signed)
Escribed

## 2018-10-08 ENCOUNTER — Other Ambulatory Visit: Payer: Self-pay | Admitting: Family Medicine

## 2018-11-06 ENCOUNTER — Other Ambulatory Visit: Payer: Self-pay | Admitting: Family Medicine

## 2018-11-30 ENCOUNTER — Telehealth: Payer: Self-pay | Admitting: Family Medicine

## 2018-11-30 MED ORDER — OMEPRAZOLE 40 MG PO CPDR: 40.0000 mg | DELAYED_RELEASE_CAPSULE | Freq: Two times a day (BID) | ORAL | 0 refills | Status: DC

## 2018-11-30 MED ORDER — PRAVASTATIN SODIUM 80 MG PO TABS: ORAL_TABLET | ORAL | 0 refills | Status: DC

## 2018-11-30 MED ORDER — ENALAPRIL MALEATE 20 MG PO TABS: 20.0000 mg | ORAL_TABLET | Freq: Every day | ORAL | 0 refills | Status: DC

## 2018-11-30 MED ORDER — METOPROLOL TARTRATE 50 MG PO TABS: 50.0000 mg | ORAL_TABLET | Freq: Two times a day (BID) | ORAL | 0 refills | Status: DC

## 2018-11-30 MED ORDER — AMLODIPINE BESYLATE 5 MG PO TABS: ORAL_TABLET | ORAL | 0 refills | Status: DC

## 2018-11-30 NOTE — Telephone Encounter (Signed)
Pt need refills on the medication and his pharmacy [facility]   Enalapril 20mg   Amlodipine 5mg   Metoprolol 25mg   Omeprazole 40mg   Pravastatin 80mg 

## 2018-11-30 NOTE — Telephone Encounter (Signed)
Updated pt's pharmacy list. E-scribed refills.  Please schedule annual CPE.

## 2018-12-04 NOTE — Telephone Encounter (Signed)
Left message asking pt to call office  °

## 2018-12-05 NOTE — Telephone Encounter (Signed)
Noted  

## 2018-12-05 NOTE — Telephone Encounter (Signed)
Labs 3/24 cpx 3/31 Pt aware

## 2019-01-30 ENCOUNTER — Other Ambulatory Visit: Payer: Self-pay | Admitting: Family Medicine

## 2019-01-30 ENCOUNTER — Other Ambulatory Visit: Payer: 59

## 2019-01-30 DIAGNOSIS — R946 Abnormal results of thyroid function studies: Secondary | ICD-10-CM

## 2019-01-30 DIAGNOSIS — E785 Hyperlipidemia, unspecified: Secondary | ICD-10-CM

## 2019-01-30 DIAGNOSIS — E1069 Type 1 diabetes mellitus with other specified complication: Secondary | ICD-10-CM

## 2019-02-06 ENCOUNTER — Encounter: Payer: 59 | Admitting: Family Medicine

## 2019-02-22 ENCOUNTER — Other Ambulatory Visit: Payer: Self-pay | Admitting: Family Medicine

## 2019-04-12 ENCOUNTER — Other Ambulatory Visit (INDEPENDENT_AMBULATORY_CARE_PROVIDER_SITE_OTHER): Payer: 59

## 2019-04-12 DIAGNOSIS — E1069 Type 1 diabetes mellitus with other specified complication: Secondary | ICD-10-CM

## 2019-04-12 DIAGNOSIS — R946 Abnormal results of thyroid function studies: Secondary | ICD-10-CM

## 2019-04-12 DIAGNOSIS — E785 Hyperlipidemia, unspecified: Secondary | ICD-10-CM | POA: Diagnosis not present

## 2019-04-12 LAB — LIPID PANEL
Cholesterol: 147 mg/dL (ref 0–200)
HDL: 75.4 mg/dL (ref >39.00)
NonHDL: 71.26
Total CHOL/HDL Ratio: 2
Triglycerides: 226 mg/dL — ABNORMAL HIGH (ref 0.0–149.0)
VLDL: 45.2 mg/dL — ABNORMAL HIGH (ref 0.0–40.0)

## 2019-04-12 LAB — COMPREHENSIVE METABOLIC PANEL
ALT: 25 U/L (ref 0–53)
AST: 26 U/L (ref 0–37)
Albumin: 3.5 g/dL (ref 3.5–5.2)
Alkaline Phosphatase: 84 U/L (ref 39–117)
BUN: 15 mg/dL (ref 6–23)
CO2: 28 mEq/L (ref 19–32)
Calcium: 8.5 mg/dL (ref 8.4–10.5)
Chloride: 104 mEq/L (ref 96–112)
Creatinine, Ser: 0.76 mg/dL (ref 0.40–1.50)
GFR: 105.89 mL/min (ref >60.00)
Glucose, Bld: 148 mg/dL — ABNORMAL HIGH (ref 70–99)
Potassium: 4.3 mEq/L (ref 3.5–5.1)
Sodium: 141 mEq/L (ref 135–145)
Total Bilirubin: 0.5 mg/dL (ref 0.2–1.2)
Total Protein: 5.8 g/dL — ABNORMAL LOW (ref 6.0–8.3)

## 2019-04-12 LAB — TSH: TSH: 0.11 u[IU]/mL — ABNORMAL LOW (ref 0.35–4.50)

## 2019-04-12 LAB — LDL CHOLESTEROL, DIRECT: Direct LDL: 45 mg/dL

## 2019-04-12 LAB — HEMOGLOBIN A1C: Hgb A1c MFr Bld: 7 % — ABNORMAL HIGH (ref 4.6–6.5)

## 2019-04-12 LAB — T4, FREE: Free T4: 1.11 ng/dL (ref 0.60–1.60)

## 2019-04-16 ENCOUNTER — Ambulatory Visit (INDEPENDENT_AMBULATORY_CARE_PROVIDER_SITE_OTHER): Payer: 59 | Admitting: Family Medicine

## 2019-04-16 ENCOUNTER — Encounter: Payer: Self-pay | Admitting: Family Medicine

## 2019-04-16 VITALS — BP 156/80 | HR 89 | Temp 98.0°F | Resp 20 | Ht 72.0 in | Wt 250.0 lb

## 2019-04-16 DIAGNOSIS — Z0000 Encounter for general adult medical examination without abnormal findings: Secondary | ICD-10-CM | POA: Diagnosis not present

## 2019-04-16 DIAGNOSIS — Z1211 Encounter for screening for malignant neoplasm of colon: Secondary | ICD-10-CM

## 2019-04-16 DIAGNOSIS — I10 Essential (primary) hypertension: Secondary | ICD-10-CM

## 2019-04-16 DIAGNOSIS — E1069 Type 1 diabetes mellitus with other specified complication: Secondary | ICD-10-CM | POA: Diagnosis not present

## 2019-04-16 DIAGNOSIS — R946 Abnormal results of thyroid function studies: Secondary | ICD-10-CM | POA: Diagnosis not present

## 2019-04-16 DIAGNOSIS — K219 Gastro-esophageal reflux disease without esophagitis: Secondary | ICD-10-CM

## 2019-04-16 DIAGNOSIS — E785 Hyperlipidemia, unspecified: Secondary | ICD-10-CM

## 2019-04-16 DIAGNOSIS — F17200 Nicotine dependence, unspecified, uncomplicated: Secondary | ICD-10-CM

## 2019-04-16 MED ORDER — OMEPRAZOLE 40 MG PO CPDR: 40.0000 mg | DELAYED_RELEASE_CAPSULE | Freq: Two times a day (BID) | ORAL | 3 refills | Status: DC

## 2019-04-16 MED ORDER — PRAVASTATIN SODIUM 80 MG PO TABS: ORAL_TABLET | ORAL | 3 refills | Status: DC

## 2019-04-16 MED ORDER — METOPROLOL TARTRATE 50 MG PO TABS: 50.0000 mg | ORAL_TABLET | Freq: Two times a day (BID) | ORAL | 3 refills | Status: DC

## 2019-04-16 MED ORDER — AMLODIPINE BESYLATE 5 MG PO TABS: ORAL_TABLET | ORAL | 3 refills | Status: DC

## 2019-04-16 NOTE — Assessment & Plan Note (Addendum)
Continue PPI BID 

## 2019-04-16 NOTE — Assessment & Plan Note (Signed)
Continue to encourage cessation. Contemplative, down to 1/2 ppd.  Eligible for lung cancer screening CT - declines at this time.

## 2019-04-16 NOTE — Assessment & Plan Note (Signed)
Preventative protocols reviewed and updated unless pt declined. Discussed healthy diet and lifestyle.  

## 2019-04-16 NOTE — Assessment & Plan Note (Addendum)
Chronic, stable on current regimen. Continue. Meds refilled. Endorses bp better controlled since endo increased ACEI to BID.

## 2019-04-16 NOTE — Assessment & Plan Note (Signed)
TSH low - pt states this is followed by endo.

## 2019-04-16 NOTE — Progress Notes (Signed)
Virtual visit completed through [employer]. Due to national recommendations of social distancing due to COVID-19, a virtual visit is felt to be most appropriate for this patient at this time. Reviewed limitations of a virtual visit.   Patient location: home Provider location: [place] at [facility], office If any vitals were documented, they were collected by patient at home unless specified below.    Ht 6' (1.829 m)   Wt 250 lb (113.4 kg)   BMI 33.91 kg/m   I have asked patient to come in for nurse visit for vitals check.   CC: CPE Subjective:    Patient ID: Russell Gomez, male    DOB: [birth year], 58 y.o.   MRN: [id number]  HPI: Russell Gomez is a 58 y.o. male presenting on 04/16/2019 for Annual Exam (CPE----no vital sign to share today,will make an appt with eye doc,never done colonoscopy)   T1DM - sees endo Q3 mo  Preventative: Colon cancer screening - pos iFOB 01/2014- pt attributed to hemorrhoids, declines colonoscopy. Agrees to repeat iFOB - we will give iFOB at upcoming nurse visit vital check Prostate cancer screening - discussed, declines.Strong stream, nocturia x1. Lung cancer screening - interested, declines Flu - declines  Pneumovax - 02/2015  Td 2010, Tdap 11/2017 Seat belt use discussed Sunscreen use discussed, no changing moles on skin. Smoker - down to 1/2 ppd Alcohol - 2-3 drinks/day Dentist - overdue Eye exam - yearly, upcoming appt next month  Caffeine: 4-5 cups/night Lives with wife, youngest son (89), no pets Occupation: Works 3rd shift; Furniture conservator/restorer at [employer]  Edu: 24yrMachinist degree Activity: walking, playing golf weekly Diet: good water, fruits/vegetables daily      Relevant past medical, surgical, family and social history reviewed and updated as indicated. Interim medical history since our last visit reviewed. Allergies and medications reviewed and updated. Outpatient Medications Prior to Visit  Medication Sig Dispense Refill  .  insulin aspart (NOVOLOG) 100 UNIT/ML injection 5-15 units before each meal based on carb intake and sugar    . Insulin Glargine (BASAGLAR KWIKPEN) 100 UNIT/ML SOPN Inject 18 Units into the skin at bedtime.     .Marland KitchenamLODipine (NORVASC) 5 MG tablet TAKE 1 TABLET BY MOUTH EVERY DAY 90 tablet 0  . enalapril (VASOTEC) 20 MG tablet Take by mouth.    . metoprolol tartrate (LOPRESSOR) 50 MG tablet TAKE 1 TABLET BY MOUTH TWICE A DAY 180 tablet 0  . omeprazole (PRILOSEC) 40 MG capsule Take 1 capsule (40 mg total) by mouth 2 (two) times daily. Needs annual physical appointment for additional refills 180 capsule 0  . pravastatin (PRAVACHOL) 80 MG tablet TAKE 1 TABLET(80 MG) BY MOUTH DAILY 90 tablet 0  . aspirin (ASPIRIN EC) 81 MG EC tablet Take 1 tablet (81 mg total) by mouth every Monday, Wednesday, and Friday. Swallow whole. 30 tablet   . Blood Glucose Monitoring Suppl (ACCU-CHEK GUIDE) w/Device KIT See admin instructions.    . enalapril (VASOTEC) 20 MG tablet Take 1 tablet (20 mg total) by mouth 2 (two) times daily.    .Marland Kitchenglucose blood (ONE TOUCH ULTRA TEST) test strip Use 6 (six) times daily    . enalapril (VASOTEC) 20 MG tablet Take 1 tablet (20 mg total) by mouth daily. (Patient not taking: Reported on 04/16/2019) 90 tablet 0  . insulin regular (HUMULIN R) 100 units/mL injection Inject 0.03-0.1 mLs (3-10 Units total) into the skin 3 (three) times daily before meals. (Patient not taking: Reported on 04/16/2019) 10  mL 3   No facility-administered medications prior to visit.      Per HPI unless specifically indicated in ROS section below Review of Systems  Constitutional: Negative for activity change, appetite change, chills, fatigue, fever and unexpected weight change.  HENT: Positive for congestion and postnasal drip. Negative for hearing loss.   Eyes: Negative for visual disturbance.  Respiratory: Negative for cough, chest tightness, shortness of breath and wheezing.   Cardiovascular: Negative for chest  pain, palpitations and leg swelling.  Gastrointestinal: Negative for abdominal distention, abdominal pain, blood in stool, constipation, diarrhea, nausea and vomiting.  Genitourinary: Negative for difficulty urinating and hematuria.  Musculoskeletal: Negative for arthralgias, myalgias and neck pain.  Skin: Negative for rash.  Neurological: Negative for dizziness, seizures, syncope and headaches.  Hematological: Negative for adenopathy. Does not bruise/bleed easily.  Psychiatric/Behavioral: Negative for dysphoric mood. The patient is not nervous/anxious.    Objective:    Ht 6' (1.829 m)   Wt 250 lb (113.4 kg)   BMI 33.91 kg/m   Wt Readings from Last 3 Encounters:  04/16/19 250 lb (113.4 kg)  04/04/18 250 lb (113.4 kg)  04/01/18 247 lb 12.8 oz (112.4 kg)     Physical exam: Gen: alert, NAD, not ill appearing Pulm: speaks in complete sentences without increased work of breathing Psych: normal mood, normal thought content      Results for orders placed or performed in visit on 04/12/19  Hemoglobin A1c  Result Value Ref Range   Hgb A1c MFr Bld 7.0 (H) 4.6 - 6.5 %  T4, free  Result Value Ref Range   Free T4 1.11 0.60 - 1.60 ng/dL  TSH  Result Value Ref Range   TSH 0.11 (L) 0.35 - 4.50 uIU/mL  Comprehensive metabolic panel  Result Value Ref Range   Sodium 141 135 - 145 mEq/L   Potassium 4.3 3.5 - 5.1 mEq/L   Chloride 104 96 - 112 mEq/L   CO2 28 19 - 32 mEq/L   Glucose, Bld 148 (H) 70 - 99 mg/dL   BUN 15 6 - 23 mg/dL   Creatinine, Ser 0.76 0.40 - 1.50 mg/dL   Total Bilirubin 0.5 0.2 - 1.2 mg/dL   Alkaline Phosphatase 84 39 - 117 U/L   AST 26 0 - 37 U/L   ALT 25 0 - 53 U/L   Total Protein 5.8 (L) 6.0 - 8.3 g/dL   Albumin 3.5 3.5 - 5.2 g/dL   Calcium 8.5 8.4 - 10.5 mg/dL   GFR 105.89 >60.00 mL/min  Lipid panel  Result Value Ref Range   Cholesterol 147 0 - 200 mg/dL   Triglycerides 226.0 (H) 0.0 - 149.0 mg/dL   HDL 75.40 >39.00 mg/dL   VLDL 45.2 (H) 0.0 - 40.0 mg/dL    Total CHOL/HDL Ratio 2    NonHDL 71.26   LDL cholesterol, direct  Result Value Ref Range   Direct LDL 45.0 mg/dL   Assessment & Plan:   Problem List Items Addressed This Visit    TOBACCO ABUSE    Continue to encourage cessation. Contemplative, down to 1/2 ppd.  Eligible for lung cancer screening CT - declines at this time.       HLD (hyperlipidemia)    Chronic, continue pravastatin. LDL at goal, trig too high - reviewed dietary choices to improve these readings.  The 10-year ASCVD risk score Mikey Bussing DC Jr., et al., 2013) is: 16.8%   Values used to calculate the score:     Age: 49 years  Sex: Male     Is Non-Hispanic African American: No     Diabetic: Yes     Tobacco smoker: Yes     Systolic Blood Pressure: 161 mmHg     Is BP treated: Yes     HDL Cholesterol: 75.4 mg/dL     Total Cholesterol: 147 mg/dL       Relevant Medications   enalapril (VASOTEC) 20 MG tablet   pravastatin (PRAVACHOL) 80 MG tablet   metoprolol tartrate (LOPRESSOR) 50 MG tablet   amLODipine (NORVASC) 5 MG tablet   Health maintenance examination - Primary    Preventative protocols reviewed and updated unless pt declined. Discussed healthy diet and lifestyle.       GERD    Continue PPI BID.       Relevant Medications   omeprazole (PRILOSEC) 40 MG capsule   Essential hypertension    Chronic, stable on current regimen. Continue. Meds refilled. Endorses bp better controlled since endo increased ACEI to BID.       Relevant Medications   enalapril (VASOTEC) 20 MG tablet   pravastatin (PRAVACHOL) 80 MG tablet   metoprolol tartrate (LOPRESSOR) 50 MG tablet   amLODipine (NORVASC) 5 MG tablet   Diabetes mellitus type 1, controlled ([place])    Appreciate endo care - sees regularly.      Relevant Medications   insulin aspart (NOVOLOG) 100 UNIT/ML injection   enalapril (VASOTEC) 20 MG tablet   pravastatin (PRAVACHOL) 80 MG tablet   Abnormal thyroid function test    TSH low - pt states this is  followed by endo.        Other Visit Diagnoses    Special screening for malignant neoplasms, colon       Relevant Orders   Fecal occult blood, imunochemical       Meds ordered this encounter  Medications  . pravastatin (PRAVACHOL) 80 MG tablet    Sig: TAKE 1 TABLET(80 MG) BY MOUTH DAILY    Dispense:  90 tablet    Refill:  3  . omeprazole (PRILOSEC) 40 MG capsule    Sig: Take 1 capsule (40 mg total) by mouth 2 (two) times daily.    Dispense:  180 capsule    Refill:  3  . metoprolol tartrate (LOPRESSOR) 50 MG tablet    Sig: Take 1 tablet (50 mg total) by mouth 2 (two) times daily.    Dispense:  180 tablet    Refill:  3  . amLODipine (NORVASC) 5 MG tablet    Sig: TAKE 1 TABLET BY MOUTH EVERY DAY    Dispense:  90 tablet    Refill:  3   Orders Placed This Encounter  Procedures  . Fecal occult blood, imunochemical    Standing Status:   Future    Standing Expiration Date:   04/15/2020    I discussed the assessment and treatment plan with the patient. The patient was provided an opportunity to ask questions and all were answered. The patient agreed with the plan and demonstrated an understanding of the instructions. The patient was advised to call back or seek an in-person evaluation if the symptoms worsen or if the condition fails to improve as anticipated.  Follow up plan: No follow-ups on file.  Ria Bush, MD

## 2019-04-16 NOTE — Assessment & Plan Note (Signed)
Chronic, continue pravastatin. LDL at goal, trig too high - reviewed dietary choices to improve these readings.  The 10-year ASCVD risk score [name], et al., 2013) is: 16.8%   Values used to calculate the score:     Age: 58 years     Sex: Male     Is Non-Hispanic African American: No     Diabetic: Yes     Tobacco smoker: Yes     Systolic Blood Pressure: 897 mmHg     Is BP treated: Yes     HDL Cholesterol: 75.4 mg/dL     Total Cholesterol: 147 mg/dL

## 2019-04-16 NOTE — Assessment & Plan Note (Signed)
Appreciate endo care - sees regularly.

## 2019-04-17 ENCOUNTER — Telehealth: Payer: Self-pay | Admitting: Family Medicine

## 2019-04-17 ENCOUNTER — Ambulatory Visit (INDEPENDENT_AMBULATORY_CARE_PROVIDER_SITE_OTHER): Payer: 59

## 2019-04-17 VITALS — BP 156/80 | HR 89 | Temp 98.0°F

## 2019-04-17 DIAGNOSIS — I10 Essential (primary) hypertension: Secondary | ICD-10-CM | POA: Diagnosis not present

## 2019-04-17 NOTE — Telephone Encounter (Signed)
plz call - BP staying elevated. I do want him to buy BP cuff or monitor closely at local pharmacy, keep BP log a few times a week and call us in 2 wks with updated readings. If staying elevated (>140/90), next step will be to increase amlodipine to 10mg  daily.

## 2019-04-17 NOTE — Progress Notes (Deleted)
plz call - BP staying elevated. I do want him to buy BP cuff or monitor closely at local pharmacy, keep BP log a few times a week and call us in 2 wks with updated readings. If staying elevated (>140/90), next step will be to increase amlodipine to 10mg  daily.

## 2019-04-17 NOTE — Progress Notes (Signed)
Per Dr. Danise Mina encounter order on 04/16/19, patient presents today for a nurse visit blood pressure check for ongoing follow up and management.  Vital Sign Readings today BP 156/80, HR 89, Resp 20, O2 Sat: 95%, Temp 98.0.     Patient is to continue on current medication regimen until further notice from Dr. Danise Mina.  He did pick up his IFOB kit at nurse visit today as well, will complete and mail back to [place].

## 2019-04-17 NOTE — Telephone Encounter (Signed)
Spoke with pt relaying Dr. Synthia Innocent instructions and message.  Pt verbalizes understanding.

## 2019-04-27 ENCOUNTER — Ambulatory Visit (INDEPENDENT_AMBULATORY_CARE_PROVIDER_SITE_OTHER): Payer: 59 | Admitting: Family Medicine

## 2019-04-27 ENCOUNTER — Encounter: Payer: Self-pay | Admitting: Family Medicine

## 2019-04-27 ENCOUNTER — Other Ambulatory Visit: Payer: Self-pay

## 2019-04-27 VITALS — Temp 97.1°F | Ht 72.0 in | Wt 252.0 lb

## 2019-04-27 DIAGNOSIS — F439 Reaction to severe stress, unspecified: Secondary | ICD-10-CM | POA: Diagnosis not present

## 2019-04-27 DIAGNOSIS — Z658 Other specified problems related to psychosocial circumstances: Secondary | ICD-10-CM | POA: Insufficient documentation

## 2019-04-27 NOTE — Assessment & Plan Note (Signed)
Does not meet MDD criteria. Anticipate situation depressed mood, difficulty managing current stress of caring for wife who herself is battling depression. Reasonable to refer to counselor per patient's request. Referral placed. Also reviewed healthy stress relieving strategies.

## 2019-04-27 NOTE — Progress Notes (Signed)
Virtual visit completed through [employer]. Due to national recommendations of social distancing due to COVID-19, a virtual visit is felt to be most appropriate for this patient at this time. Reviewed limitations of a virtual visit.   Patient location: home Provider location: [place] at [facility], office If any vitals were documented, they were collected by patient at home unless specified below.    Temp (!) 97.1 F (36.2 C)   Ht 6' (1.829 m)   Wt 252 lb (114.3 kg)   BMI 34.18 kg/m    CC: discuss mood/referral Subjective:    Patient ID: Russell Gomez, male    DOB: [birth year], 58 y.o.   MRN: [id number]  HPI: KATO WIECZOREK is a 58 y.o. male presenting on 04/27/2019 for Referral (Requests referral for counseling. )   Depressed mood - wife is battling depression - this is getting him down and stressed. Especially bad week. He would like to discuss how he can better manage current stressors with counselor.   Denies significant depression or anhedonia. 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 Denies appetite changes. Some guilt over wife's situation. Energy levels ok. Concentration ok. No psychomotor significant agitation/retardation. No SI/HI.       Relevant past medical, surgical, family and social history reviewed and updated as indicated. Interim medical history since our last visit reviewed. Allergies and medications reviewed and updated. Outpatient Medications Prior to Visit  Medication Sig Dispense Refill  . amLODipine (NORVASC) 5 MG tablet TAKE 1 TABLET BY MOUTH EVERY DAY 90 tablet 3  . aspirin (ASPIRIN EC) 81 MG EC tablet Take 1 tablet (81 mg total) by mouth every Monday, Wednesday, and Friday. Swallow whole. 30 tablet   . Blood Glucose Monitoring Suppl (ACCU-CHEK GUIDE) w/Device KIT See admin instructions.    . enalapril (VASOTEC) 20 MG tablet Take 1 tablet (20 mg total) by mouth 2 (two) times daily.    . insulin aspart  (NOVOLOG) 100 UNIT/ML injection 5-15 units before each meal based on carb intake and sugar    . Insulin Glargine (BASAGLAR KWIKPEN) 100 UNIT/ML SOPN Inject 18 Units into the skin at bedtime.     . metoprolol tartrate (LOPRESSOR) 50 MG tablet Take 1 tablet (50 mg total) by mouth 2 (two) times daily. 180 tablet 3  . omeprazole (PRILOSEC) 40 MG capsule Take 1 capsule (40 mg total) by mouth 2 (two) times daily. 180 capsule 3  . pravastatin (PRAVACHOL) 80 MG tablet TAKE 1 TABLET(80 MG) BY MOUTH DAILY 90 tablet 3  . glucose blood (ONE TOUCH ULTRA TEST) test strip Use 6 (six) times daily     No facility-administered medications prior to visit.      Per HPI unless specifically indicated in ROS section below Review of Systems Objective:    Temp (!) 97.1 F (36.2 C)   Ht 6' (1.829 m)   Wt 252 lb (114.3 kg)   BMI 34.18 kg/m   Wt Readings from Last 3 Encounters:  04/27/19 252 lb (114.3 kg)  04/16/19 250 lb (113.4 kg)  04/04/18 250 lb (113.4 kg)     Physical exam: Gen: alert, NAD, not ill appearing Pulm: speaks in complete sentences without increased work of breathing Psych: normal mood, normal thought content      Assessment & Plan:   Problem List Items Addressed This Visit    Stress at home - Primary    Does not meet MDD criteria. Anticipate situation depressed  mood, difficulty managing current stress of caring for wife who herself is battling depression. Reasonable to refer to counselor per patient's request. Referral placed. Also reviewed healthy stress relieving strategies.       Relevant Orders   Ambulatory referral to Psychology       No orders of the defined types were placed in this encounter.  Orders Placed This Encounter  Procedures  . Ambulatory referral to Psychology    Referral Priority:   Routine    Referral Type:   Psychiatric    Referral Reason:   Specialty Services Required    Requested Specialty:   Psychology    Number of Visits Requested:   1    I  discussed the assessment and treatment plan with the patient. The patient was provided an opportunity to ask questions and all were answered. The patient agreed with the plan and demonstrated an understanding of the instructions. The patient was advised to call back or seek an in-person evaluation if the symptoms worsen or if the condition fails to improve as anticipated.  Follow up plan: No follow-ups on file.  Ria Bush, MD

## 2019-05-02 ENCOUNTER — Ambulatory Visit: Payer: 59 | Admitting: Psychology

## 2019-05-11 ENCOUNTER — Other Ambulatory Visit: Payer: Self-pay | Admitting: Family Medicine

## 2019-07-19 ENCOUNTER — Telehealth: Payer: Self-pay

## 2019-07-19 MED ORDER — AMLODIPINE BESYLATE 5 MG PO TABS: ORAL_TABLET | ORAL | 3 refills | Status: DC

## 2019-07-19 MED ORDER — PRAVASTATIN SODIUM 80 MG PO TABS: ORAL_TABLET | ORAL | 3 refills | Status: DC

## 2019-07-19 MED ORDER — METOPROLOL TARTRATE 50 MG PO TABS: 50.0000 mg | ORAL_TABLET | Freq: Two times a day (BID) | ORAL | 3 refills | Status: DC

## 2019-07-19 MED ORDER — ENALAPRIL MALEATE 20 MG PO TABS: 20.0000 mg | ORAL_TABLET | Freq: Two times a day (BID) | ORAL | 3 refills | Status: DC

## 2019-07-19 NOTE — Telephone Encounter (Signed)
Pt called asking for refills on "all" of his medications. I advised him that we needed to know which medications he was needing as several of his medications had been written in June for 1 year at [facility]. I suggested he call [facility] and have his rxs transferred. He said [facility] told him they were transferred but could not tell him where. I sent in the 4 he asked for to [place] as requested.

## 2019-09-23 ENCOUNTER — Emergency Department
Admission: EM | Admit: 2019-09-23 | Discharge: 2019-09-23 | Disposition: A | Discharge status: Home / Self Care | Payer: Self-pay | Attending: Emergency Medicine | Admitting: Emergency Medicine

## 2019-09-23 ENCOUNTER — Emergency Department: Payer: Self-pay

## 2019-09-23 ENCOUNTER — Other Ambulatory Visit: Payer: Self-pay

## 2019-09-23 DIAGNOSIS — Z794 Long term (current) use of insulin: Secondary | ICD-10-CM | POA: Insufficient documentation

## 2019-09-23 DIAGNOSIS — M25572 Pain in left ankle and joints of left foot: Secondary | ICD-10-CM | POA: Insufficient documentation

## 2019-09-23 DIAGNOSIS — F17210 Nicotine dependence, cigarettes, uncomplicated: Secondary | ICD-10-CM | POA: Insufficient documentation

## 2019-09-23 DIAGNOSIS — Z79899 Other long term (current) drug therapy: Secondary | ICD-10-CM | POA: Insufficient documentation

## 2019-09-23 DIAGNOSIS — Z7982 Long term (current) use of aspirin: Secondary | ICD-10-CM | POA: Insufficient documentation

## 2019-09-23 DIAGNOSIS — E109 Type 1 diabetes mellitus without complications: Secondary | ICD-10-CM | POA: Insufficient documentation

## 2019-09-23 DIAGNOSIS — I10 Essential (primary) hypertension: Secondary | ICD-10-CM | POA: Insufficient documentation

## 2019-09-23 MED ORDER — TRAMADOL HCL 50 MG PO TABS: 50.0000 mg | ORAL_TABLET | Freq: Four times a day (QID) | ORAL | 0 refills | Status: AC | PRN

## 2019-09-23 MED ORDER — ACETAMINOPHEN 325 MG PO TABS
650.0000 mg | ORAL_TABLET | Freq: Once | ORAL | Status: AC
  Administered 2019-09-23: 650 mg via ORAL
  Filled 2019-09-23: qty 2

## 2019-09-23 NOTE — Discharge Instructions (Addendum)
Apply ice to left ankle.   Tramadol can be taken for pain.
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
## 2019-10-17 ENCOUNTER — Telehealth: Payer: Self-pay

## 2019-10-17 MED ORDER — PRAVASTATIN SODIUM 80 MG PO TABS: ORAL_TABLET | ORAL | 2 refills | Status: DC

## 2019-10-17 NOTE — Telephone Encounter (Addendum)
Received faxed rx request from [facility] for pravastatin 80 mg tab.    Spoke with pt asking if he requested this.  He confirms the request due to cost.  Notified him rx will be sent.   E-scribed rx.

## 2019-10-17 NOTE — Addendum Note (Signed)
Addended by: Brenton Grills on: [id number] AM   Modules accepted: Orders

## 2019-10-22 ENCOUNTER — Other Ambulatory Visit: Payer: Self-pay

## 2019-10-22 MED ORDER — AMLODIPINE BESYLATE 5 MG PO TABS: ORAL_TABLET | ORAL | 3 refills | Status: DC

## 2019-10-22 NOTE — Telephone Encounter (Signed)
Transfer request to [facility] instead. See other message also.

## 2019-10-23 ENCOUNTER — Telehealth: Payer: Self-pay

## 2019-10-23 NOTE — Telephone Encounter (Signed)
Looks like rx, #90/3, was sent to [facility] yesterday.  Spoke with pharmacy, confirmed it was not received.  Gave verbal rx.  Rx will be processed.

## 2019-10-23 NOTE — Telephone Encounter (Signed)
[place] Night - Client TELEPHONE ADVICE RECORD [facility] Patient Name: Russell Gomez Gender: Male DOB: Oct 30, 1961 Age: 58 Y 24 D Return Phone Number: [phone] (Primary) [place] [address] Client [place] Night - Client Client Site [place] Physician Ria Bush - MD Contact Type Call Who Is Calling Pharmacy Call Type Pharmacy Send to RN Chief Complaint Prescription Refill or Medication Request (non symptomatic) Reason for Call Request to clarify medication order Initial Comment Caller states she is calling regarding a mutual patient from the pharmacy request. Additional Comment Pharmacy Name [facility] Pharmacist Name [place] Number [phone] Translation No Nurse Assessment Nurse: Matthew Folks, RN, Hannelore Date/Time (Eastern Time): 10/22/2019 6:35:15 PM Confirm and document reason for call. If symptomatic, describe symptoms. ---Caller is requesting that the pt get a new prescription for her amlodipine. The pt wants the script to be sent to them. FaxML:[phone] NPI: [id number] (electronic) or can call verbally. Denies triage. Has the patient had close contact with a person known or suspected to have the novel coronavirus illness OR traveled / lives in area with major community spread (including international travel) in the last 14 days from the onset of symptoms? * If Asymptomatic, screen for exposure and travel within the last 14 days. ---No Does the patient have any new or worsening symptoms? ---No Nurse: Matthew Folks, RN, Hannelore Date/Time (Eastern Time): 10/22/2019 6:38:42 PM Please select the assessment type ---Refill Additional Documentation ---Caller is requesting that the pt get a new prescription for her amlodipine. The pt wants the script to be sent to them. FaxML:[id number] NPI: [id number] (electronic) or can call  verbally. Does the patient have enough medication to last until the office opens? ---Yes Guidelines Guideline Title Affirmed Question Affirmed Notes Nurse Date/Time Eilene Ghazi Time) PLEASE NOTE: All timestamps contained within this report are represented as [place] Standard Time. CONFIDENTIALTY NOTICE: This fax transmission is intended only for the addressee. It contains information that is legally privileged, confidential or otherwise protected from use or disclosure. If you are not the intended recipient, you are strictly prohibited from reviewing, disclosing, copying using or disseminating any of this information or taking any action in reliance on or regarding this information. If you have received this fax in error, please notify us immediately by telephone so that we can arrange for its return to [place]. Phone: [phone], Toll-Free: [phone], Fax: [phone] Page: 2 of 2 Call Id: [id number] [place]. Time Eilene Ghazi Time) Disposition Final User 10/22/2019 6:39:30 PM Pharmacy Call [facility], RN, Ilsa Iha Reason: CAlled regarding new script to this pharmacy for pt. 10/22/2019 6:39:38 PM Clinical Call Yes Sh[place], RN, Ilsa Iha

## 2019-11-06 ENCOUNTER — Telehealth: Payer: Self-pay | Admitting: Family Medicine

## 2019-11-06 MED ORDER — OMEPRAZOLE 40 MG PO CPDR: 40.0000 mg | DELAYED_RELEASE_CAPSULE | Freq: Two times a day (BID) | ORAL | 3 refills | Status: DC

## 2019-11-06 NOTE — Telephone Encounter (Signed)
[facility] is calling in regards to the patient's refill for OMEPRAZOLE  They stated a refill request was faxed over to the office yesterday and today.and wanted to make sure that we received the request.

## 2019-11-06 NOTE — Telephone Encounter (Signed)
E-scribed refill 

## 2019-11-13 ENCOUNTER — Other Ambulatory Visit: Payer: Self-pay

## 2019-11-13 MED ORDER — OMEPRAZOLE 40 MG PO CPDR: 40.0000 mg | DELAYED_RELEASE_CAPSULE | Freq: Two times a day (BID) | ORAL | 0 refills | Status: DC

## 2019-11-13 NOTE — Telephone Encounter (Signed)
Patient's Pharmacy [facility] Called. They did not receive the patient's refill for the OMEPRAZOLE They would like to know if you can resend that or give them a call to do a verbal so they can get it filled for the patient

## 2019-11-13 NOTE — Telephone Encounter (Signed)
Spoke with patient earlier and he said that [facility] does have this order on file but there is a problem on their end that patient is dealing with them on. No further action is needed at this time

## 2019-11-13 NOTE — Telephone Encounter (Signed)
Patient states his mail order Rx for Omeprazole will not be in for another 5 days or so and asked to have short supply sent to [facility] while he is trying to figure out his mail order issue. Sent refill for 2 weeks just in case to [facility] since patient is out of the medication.

## 2019-11-29 ENCOUNTER — Telehealth: Payer: Self-pay | Admitting: Family Medicine

## 2019-11-29 NOTE — Telephone Encounter (Signed)
Noted  

## 2019-11-29 NOTE — Telephone Encounter (Signed)
Pt left a voicemail requesting a call back for recommendations regarding his ankle swelling.  Pt denies any SOB or pain. Pt states that he has been taking his medication as prescribed BID it does not seem to be helping to reduce the swelling.  Scheduled an In-Office appt on Monday Dec 03, 2019 with Dr Danise Mina -- No covid symptoms - pt has not been seen since 04/2019  Will send to Dr Darnell Level as Juluis Rainier

## 2019-12-03 ENCOUNTER — Encounter: Payer: Self-pay | Admitting: Family Medicine

## 2019-12-03 ENCOUNTER — Other Ambulatory Visit: Payer: Self-pay

## 2019-12-03 ENCOUNTER — Ambulatory Visit (INDEPENDENT_AMBULATORY_CARE_PROVIDER_SITE_OTHER): Payer: BC Managed Care – PPO | Admitting: Family Medicine

## 2019-12-03 VITALS — BP 136/76 | HR 82 | Temp 97.9°F | Ht 74.0 in | Wt 248.4 lb

## 2019-12-03 DIAGNOSIS — E0590 Thyrotoxicosis, unspecified without thyrotoxic crisis or storm: Secondary | ICD-10-CM | POA: Diagnosis not present

## 2019-12-03 DIAGNOSIS — R600 Localized edema: Secondary | ICD-10-CM | POA: Diagnosis not present

## 2019-12-03 DIAGNOSIS — I10 Essential (primary) hypertension: Secondary | ICD-10-CM

## 2019-12-03 DIAGNOSIS — F17200 Nicotine dependence, unspecified, uncomplicated: Secondary | ICD-10-CM

## 2019-12-03 DIAGNOSIS — E1069 Type 1 diabetes mellitus with other specified complication: Secondary | ICD-10-CM | POA: Diagnosis not present

## 2019-12-03 DIAGNOSIS — F439 Reaction to severe stress, unspecified: Secondary | ICD-10-CM

## 2019-12-03 DIAGNOSIS — R946 Abnormal results of thyroid function studies: Secondary | ICD-10-CM | POA: Diagnosis not present

## 2019-12-03 LAB — COMPREHENSIVE METABOLIC PANEL
ALT: 14 U/L (ref 0–53)
AST: 21 U/L (ref 0–37)
Albumin: 3.3 g/dL — ABNORMAL LOW (ref 3.5–5.2)
Alkaline Phosphatase: 124 U/L — ABNORMAL HIGH (ref 39–117)
BUN: 9 mg/dL (ref 6–23)
CO2: 32 mEq/L (ref 19–32)
Calcium: 7.8 mg/dL — ABNORMAL LOW (ref 8.4–10.5)
Chloride: 100 mEq/L (ref 96–112)
Creatinine, Ser: 0.66 mg/dL (ref 0.40–1.50)
GFR: 124.33 mL/min (ref >60.00)
Glucose, Bld: 127 mg/dL — ABNORMAL HIGH (ref 70–99)
Potassium: 4.1 mEq/L (ref 3.5–5.1)
Sodium: 140 mEq/L (ref 135–145)
Total Bilirubin: 0.6 mg/dL (ref 0.2–1.2)
Total Protein: 5.8 g/dL — ABNORMAL LOW (ref 6.0–8.3)

## 2019-12-03 LAB — CBC WITH DIFFERENTIAL/PLATELET
Basophils Absolute: 0.1 10*3/uL (ref 0.0–0.1)
Basophils Relative: 0.6 % (ref 0.0–3.0)
Eosinophils Absolute: 0.2 10*3/uL (ref 0.0–0.7)
Eosinophils Relative: 1.9 % (ref 0.0–5.0)
HCT: 38.7 % — ABNORMAL LOW (ref 39.0–52.0)
Hemoglobin: 12.9 g/dL — ABNORMAL LOW (ref 13.0–17.0)
Lymphocytes Relative: 14.4 % (ref 12.0–46.0)
Lymphs Abs: 1.5 10*3/uL (ref 0.7–4.0)
MCHC: 33.4 g/dL (ref 30.0–36.0)
MCV: 101 fl — ABNORMAL HIGH (ref 78.0–100.0)
Monocytes Absolute: 0.5 10*3/uL (ref 0.1–1.0)
Monocytes Relative: 5 % (ref 3.0–12.0)
Neutro Abs: 8.1 10*3/uL — ABNORMAL HIGH (ref 1.4–7.7)
Neutrophils Relative %: 78.1 % — ABNORMAL HIGH (ref 43.0–77.0)
Platelets: 362 10*3/uL (ref 150.0–400.0)
RBC: 3.83 Mil/uL — ABNORMAL LOW (ref 4.22–5.81)
RDW: 16.3 % — ABNORMAL HIGH (ref 11.5–15.5)
WBC: 10.4 10*3/uL (ref 4.0–10.5)

## 2019-12-03 LAB — HEMOGLOBIN A1C: Hgb A1c MFr Bld: 7.5 % — ABNORMAL HIGH (ref 4.6–6.5)

## 2019-12-03 LAB — T4, FREE: Free T4: 0.87 ng/dL (ref 0.60–1.60)

## 2019-12-03 LAB — TSH: TSH: 0.18 u[IU]/mL — ABNORMAL LOW (ref 0.35–4.50)

## 2019-12-03 MED ORDER — FUROSEMIDE 20 MG PO TABS: 20.0000 mg | ORAL_TABLET | Freq: Every day | ORAL | 1 refills | Status: DC | PRN

## 2019-12-03 NOTE — Assessment & Plan Note (Signed)
Worse over the past week. This correlates to recent diet changes (increased frozen dinners and canned soups). Anticipate sodium related edema. Discussed low sodium alternatives. Check labwork r/o other causes. Doubt amlodipine related.

## 2019-12-03 NOTE — Assessment & Plan Note (Signed)
Chronic, stable on current regimen.  

## 2019-12-03 NOTE — Assessment & Plan Note (Signed)
Wife currently in rehab for 3-6 months.

## 2019-12-03 NOTE — Progress Notes (Signed)
This visit was conducted in person.  BP 136/76 (BP Location: Left Arm, Patient Position: Sitting, Cuff Size: Large)   Pulse 82   Temp 97.9 F (36.6 C) (Temporal)   Ht 6' 2"  (1.88 m)   Wt 248 lb 6 oz (112.7 kg)   SpO2 92%   BMI 31.89 kg/m    CC: check ankles Subjective:    Patient ID: Russell Gomez, male    DOB: 1960-12-15, 59 y.o.   MRN: [id number]  HPI: Russell Gomez is a 59 y.o. male presenting on 12/03/2019 for Joint Swelling (C/o bilateral ankle swelling, worse at night.  Started about 1 wk ago.  )   Wife has been dealing with alcoholism, currently in rehab.   1 wk h/o bilateral ankle swelling, without redness, warmth or pain.  Denies increased salt intake recently, but has had more canned soups and frozen dinners recently. Feels drinking plenty of water.  On amlodipine 5m - but this is not a new medication. He is working 3rd shift, worse at end of shift.   Denies chest pain, tightness, dyspnea, cough, dizziness.   DM - managed by endo. Latest A1c ~7.1%   Continues smoking 1.5 ppd.      Relevant past medical, surgical, family and social history reviewed and updated as indicated. Interim medical history since our last visit reviewed. Allergies and medications reviewed and updated. Outpatient Medications Prior to Visit  Medication Sig Dispense Refill  . insulin lispro (HUMALOG) 100 UNIT/ML injection 5-15 units before each meal based on carb intake and sugar    . amLODipine (NORVASC) 5 MG tablet TAKE 1 TABLET BY MOUTH EVERY DAY 90 tablet 3  . aspirin (ASPIRIN EC) 81 MG EC tablet Take 1 tablet (81 mg total) by mouth every Monday, Wednesday, and Friday. Swallow whole. 30 tablet   . Blood Glucose Monitoring Suppl (ACCU-CHEK GUIDE) w/Device KIT See admin instructions.    . enalapril (VASOTEC) 20 MG tablet Take 1 tablet (20 mg total) by mouth 2 (two) times daily. 90 tablet 3  . glucose blood (ONE TOUCH ULTRA TEST) test strip Use 6 (six) times daily    . Insulin NPH,  Human,, Isophane, (HUMULIN N KWIKPEN) 100 UNIT/ML Kiwkpen Inject 9 Units into the skin 2 (two) times daily.    . metoprolol tartrate (LOPRESSOR) 50 MG tablet Take 1 tablet (50 mg total) by mouth 2 (two) times daily. 180 tablet 3  . omeprazole (PRILOSEC) 40 MG capsule Take 1 capsule (40 mg total) by mouth 2 (two) times daily. 28 capsule 0  . pravastatin (PRAVACHOL) 80 MG tablet TAKE 1 TABLET(80 MG) BY MOUTH DAILY 90 tablet 2  . insulin aspart (NOVOLOG) 100 UNIT/ML injection 5-15 units before each meal based on carb intake and sugar    . Insulin Glargine (BASAGLAR KWIKPEN) 100 UNIT/ML SOPN Inject 18 Units into the skin at bedtime.      No facility-administered medications prior to visit.     Per HPI unless specifically indicated in ROS section below Review of Systems Objective:    BP 136/76 (BP Location: Left Arm, Patient Position: Sitting, Cuff Size: Large)   Pulse 82   Temp 97.9 F (36.6 C) (Temporal)   Ht 6' 2"  (1.88 m)   Wt 248 lb 6 oz (112.7 kg)   SpO2 92%   BMI 31.89 kg/m   Wt Readings from Last 3 Encounters:  12/03/19 248 lb 6 oz (112.7 kg)  09/23/19 240 lb (108.9 kg)  04/27/19 252 lb (114.3  kg)    Physical Exam Vitals and nursing note reviewed.  Constitutional:      Appearance: Normal appearance. He is not ill-appearing.  Cardiovascular:     Rate and Rhythm: Normal rate and regular rhythm.     Pulses: Normal pulses.     Heart sounds: Normal heart sounds. No murmur.  Pulmonary:     Effort: Pulmonary effort is normal. No respiratory distress.     Breath sounds: Normal breath sounds. No wheezing, rhonchi or rales.  Abdominal:     General: Abdomen is flat. Bowel sounds are normal. There is no distension.     Palpations: Abdomen is soft. There is no mass.     Tenderness: There is no abdominal tenderness. There is no guarding or rebound.     Hernia: No hernia is present.  Musculoskeletal:     Right lower leg: Edema (1+) present.     Left lower leg: Edema (1+) present.    Neurological:     Mental Status: He is alert.  Psychiatric:        Mood and Affect: Mood normal.        Behavior: Behavior normal.       Results for orders placed or performed in visit on 04/12/19  Hemoglobin A1c  Result Value Ref Range   Hgb A1c MFr Bld 7.0 (H) 4.6 - 6.5 %  T4, free  Result Value Ref Range   Free T4 1.11 0.60 - 1.60 ng/dL  TSH  Result Value Ref Range   TSH 0.11 (L) 0.35 - 4.50 uIU/mL  Comprehensive metabolic panel  Result Value Ref Range   Sodium 141 135 - 145 mEq/L   Potassium 4.3 3.5 - 5.1 mEq/L   Chloride 104 96 - 112 mEq/L   CO2 28 19 - 32 mEq/L   Glucose, Bld 148 (H) 70 - 99 mg/dL   BUN 15 6 - 23 mg/dL   Creatinine, Ser 0.76 0.40 - 1.50 mg/dL   Total Bilirubin 0.5 0.2 - 1.2 mg/dL   Alkaline Phosphatase 84 39 - 117 U/L   AST 26 0 - 37 U/L   ALT 25 0 - 53 U/L   Total Protein 5.8 (L) 6.0 - 8.3 g/dL   Albumin 3.5 3.5 - 5.2 g/dL   Calcium 8.5 8.4 - 10.5 mg/dL   GFR 105.89 >60.00 mL/min  Lipid panel  Result Value Ref Range   Cholesterol 147 0 - 200 mg/dL   Triglycerides 226.0 (H) 0.0 - 149.0 mg/dL   HDL 75.40 >39.00 mg/dL   VLDL 45.2 (H) 0.0 - 40.0 mg/dL   Total CHOL/HDL Ratio 2    NonHDL 71.26   LDL cholesterol, direct  Result Value Ref Range   Direct LDL 45.0 mg/dL   Assessment & Plan:  This visit occurred during the SARS-CoV-2 public health emergency.  Safety protocols were in place, including screening questions prior to the visit, additional usage of staff PPE, and extensive cleaning of exam room while observing appropriate contact time as indicated for disinfecting solutions.   Problem List Items Addressed This Visit    TOBACCO ABUSE    Smoking has increased - up to 1.5 ppd - due to increased recent stressors      Stress at home    Wife currently in rehab for 3-6 months.       Pedal edema - Primary    Worse over the past week. This correlates to recent diet changes (increased frozen dinners and canned soups). Anticipate sodium  related  edema. Discussed low sodium alternatives. Check labwork r/o other causes. Doubt amlodipine related.       Relevant Orders   Comprehensive metabolic panel   TSH   CBC with Differential/Platelet   Essential hypertension    Chronic, stable on current regimen.       Relevant Medications   furosemide (LASIX) 20 MG tablet   Diabetes mellitus type 1, controlled (HCC)   Relevant Medications   insulin lispro (HUMALOG) 100 UNIT/ML injection   Insulin NPH, Human,, Isophane, (HUMULIN N KWIKPEN) 100 UNIT/ML Kiwkpen   Other Relevant Orders   Hemoglobin A1c   Abnormal thyroid function test    Update labs. ?subclinical hyperthyroidism.       Relevant Orders   TSH   T3   T4, free       Meds ordered this encounter  Medications  . furosemide (LASIX) 20 MG tablet    Sig: Take 1 tablet (20 mg total) by mouth daily as needed for fluid or edema.    Dispense:  30 tablet    Refill:  1   Orders Placed This Encounter  Procedures  . Comprehensive metabolic panel  . TSH  . CBC with Differential/Platelet  . Hemoglobin A1c  . T3  . T4, free    Patient Instructions  I think leg swelling is coming from salt/sodium in the diet.  Continue good water intake, try to change to low sodium alternatives.  Labs to check other possible causes.  Good to see you today.    Follow up plan: Return if symptoms worsen or fail to improve.  Ria Bush, MD

## 2019-12-03 NOTE — Patient Instructions (Addendum)
I think leg swelling is coming from salt/sodium in the diet.  Continue good water intake, try to change to low sodium alternatives.  Labs to check other possible causes.  Good to see you today.

## 2019-12-03 NOTE — Assessment & Plan Note (Signed)
Smoking has increased - up to 1.5 ppd - due to increased recent stressors

## 2019-12-03 NOTE — Assessment & Plan Note (Signed)
Update labs. ?subclinical hyperthyroidism.

## 2019-12-04 LAB — T3: T3, Total: 106 ng/dL (ref 76–181)

## 2019-12-26 ENCOUNTER — Telehealth: Payer: Self-pay | Admitting: Family Medicine

## 2019-12-26 NOTE — Telephone Encounter (Signed)
-----   Message from Brenton Grills, [place] sent at 12/11/2019  9:36 AM EST ----- Pt notified as instructed by phone.  Verbalizes understanding.  States the leg swelling has not improved.   Scheduled CPE on 03/04/20 at 10:30, labs on 02/25/20 at 9:05.

## 2019-12-26 NOTE — Telephone Encounter (Signed)
Spoke with pt asking about leg swelling.  States it has improved.  Says it's better when he first gets out of bed.

## 2019-12-26 NOTE — Telephone Encounter (Signed)
plz call for update - he was having leg swelling 2 weeks ago, we recommended increased protein in diet and decreased salt in diet. How has he done over the last 2 wks and how is leg swelling now?

## 2020-01-28 ENCOUNTER — Telehealth: Payer: Self-pay

## 2020-01-28 MED ORDER — AMLODIPINE BESYLATE 5 MG PO TABS: ORAL_TABLET | ORAL | 2 refills | Status: DC

## 2020-01-28 NOTE — Telephone Encounter (Signed)
E-scribed refill to [place].  Notified pt by phn refill sent.  Asked if he will use [facility] for any other meds.  Says he will not and requests [facility] be removed from chart.  Pt's chart has been updated.

## 2020-01-28 NOTE — Telephone Encounter (Signed)
Pt left v/m that now that pt has ins he request to stop getting amlodipine 5 mg from [facility]  And get amlodipine 5 mg from [facility] [address].

## 2020-02-07 ENCOUNTER — Other Ambulatory Visit: Payer: Self-pay | Admitting: Family Medicine

## 2020-02-24 ENCOUNTER — Other Ambulatory Visit: Payer: Self-pay | Admitting: Family Medicine

## 2020-02-24 DIAGNOSIS — E785 Hyperlipidemia, unspecified: Secondary | ICD-10-CM

## 2020-02-24 DIAGNOSIS — E1069 Type 1 diabetes mellitus with other specified complication: Secondary | ICD-10-CM

## 2020-02-24 DIAGNOSIS — Z125 Encounter for screening for malignant neoplasm of prostate: Secondary | ICD-10-CM

## 2020-02-24 DIAGNOSIS — R946 Abnormal results of thyroid function studies: Secondary | ICD-10-CM

## 2020-02-25 ENCOUNTER — Other Ambulatory Visit: Payer: BC Managed Care – PPO

## 2020-02-25 NOTE — Addendum Note (Signed)
Addended by: Ria Bush on: 02/25/2020 07:36 AM   Modules accepted: Orders

## 2020-02-25 NOTE — Addendum Note (Signed)
Addended by: Ria Bush on: 02/25/2020 07:42 AM   Modules accepted: Orders

## 2020-03-04 ENCOUNTER — Encounter: Payer: BC Managed Care – PPO | Admitting: Family Medicine

## 2020-03-11 ENCOUNTER — Encounter: Payer: BC Managed Care – PPO | Admitting: Family Medicine

## 2020-03-15 ENCOUNTER — Other Ambulatory Visit: Payer: Self-pay | Admitting: Family Medicine

## 2020-03-17 DIAGNOSIS — J22 Unspecified acute lower respiratory infection: Secondary | ICD-10-CM | POA: Diagnosis not present

## 2020-03-17 DIAGNOSIS — Z03818 Encounter for observation for suspected exposure to other biological agents ruled out: Secondary | ICD-10-CM | POA: Diagnosis not present

## 2020-03-17 DIAGNOSIS — I491 Atrial premature depolarization: Secondary | ICD-10-CM | POA: Diagnosis not present

## 2020-03-17 DIAGNOSIS — R05 Cough: Secondary | ICD-10-CM | POA: Diagnosis not present

## 2020-03-19 ENCOUNTER — Encounter: Payer: BC Managed Care – PPO | Admitting: Family Medicine

## 2020-03-29 DIAGNOSIS — R404 Transient alteration of awareness: Secondary | ICD-10-CM | POA: Diagnosis not present

## 2020-03-29 DIAGNOSIS — E161 Other hypoglycemia: Secondary | ICD-10-CM | POA: Diagnosis not present

## 2020-03-29 DIAGNOSIS — E162 Hypoglycemia, unspecified: Secondary | ICD-10-CM | POA: Diagnosis not present

## 2020-03-29 DIAGNOSIS — R0902 Hypoxemia: Secondary | ICD-10-CM | POA: Diagnosis not present

## 2020-04-04 ENCOUNTER — Emergency Department
Admission: EM | Admit: 2020-04-04 | Discharge: 2020-04-04 | Disposition: A | Discharge status: Home / Self Care | Payer: BC Managed Care – PPO | Attending: Emergency Medicine | Admitting: Emergency Medicine

## 2020-04-04 ENCOUNTER — Other Ambulatory Visit: Payer: Self-pay

## 2020-04-04 DIAGNOSIS — E162 Hypoglycemia, unspecified: Secondary | ICD-10-CM

## 2020-04-04 DIAGNOSIS — Z7982 Long term (current) use of aspirin: Secondary | ICD-10-CM | POA: Insufficient documentation

## 2020-04-04 DIAGNOSIS — R410 Disorientation, unspecified: Secondary | ICD-10-CM | POA: Diagnosis not present

## 2020-04-04 DIAGNOSIS — F17210 Nicotine dependence, cigarettes, uncomplicated: Secondary | ICD-10-CM | POA: Insufficient documentation

## 2020-04-04 DIAGNOSIS — Z79899 Other long term (current) drug therapy: Secondary | ICD-10-CM | POA: Diagnosis not present

## 2020-04-04 DIAGNOSIS — I10 Essential (primary) hypertension: Secondary | ICD-10-CM | POA: Diagnosis not present

## 2020-04-04 DIAGNOSIS — Z794 Long term (current) use of insulin: Secondary | ICD-10-CM | POA: Diagnosis not present

## 2020-04-04 DIAGNOSIS — R4182 Altered mental status, unspecified: Secondary | ICD-10-CM | POA: Diagnosis not present

## 2020-04-04 DIAGNOSIS — E11649 Type 2 diabetes mellitus with hypoglycemia without coma: Secondary | ICD-10-CM | POA: Diagnosis not present

## 2020-04-04 DIAGNOSIS — E10649 Type 1 diabetes mellitus with hypoglycemia without coma: Secondary | ICD-10-CM | POA: Insufficient documentation

## 2020-04-04 DIAGNOSIS — E161 Other hypoglycemia: Secondary | ICD-10-CM | POA: Diagnosis not present

## 2020-04-04 DIAGNOSIS — R404 Transient alteration of awareness: Secondary | ICD-10-CM | POA: Diagnosis not present

## 2020-04-04 LAB — GLUCOSE, CAPILLARY
Glucose-Capillary: 81 mg/dL (ref 70–99)
Glucose-Capillary: 150 mg/dL — ABNORMAL HIGH (ref 70–99)

## 2020-04-04 NOTE — ED Triage Notes (Signed)
Pt in via ems from work d/t confusion and upon checking a 40BG. Up to 61BG upon 2nd check. Currently A&Ox4.

## 2020-04-04 NOTE — Discharge Instructions (Addendum)
As we discussed, you may need an adjustment to your insulin dose. I recommend you follow-up with your primary care doctor the next available opportunity to discuss your recent episodes of hypoglycemia. One thing you should consider in the meantime is to try to eat a meal with a variety of foods (such as a [place] sandwich) that includes different kinds of carbohydrates, proteins, dairy, etc. This will stick with the longer than just orange or pineapple juice and help prevent hypoglycemia.    Return to the emergency department if you develop new or worsening symptoms that concern you.

## 2020-04-04 NOTE — ED Provider Notes (Signed)
[facility] Emergency Department Provider Note  ____________________________________________   First MD Initiated Contact with Patient 04/04/20 0113     (approximate)  I have reviewed the triage vital signs and the nursing notes.   HISTORY  Chief Complaint Hypoglycemia    HPI Russell Gomez is a 59 y.o. male who reportedly has type 1 diabetes and presents by EMS for assessment of altered mental status. Reportedly this is happened before and has been treated with an oral oral glucose or sugar containing solution at work (he works third shift). It happened last week and it corrected at work without any issue. Tonight he was confused and altered and apparently diaphoretic and upon checking his blood sugar it was reading is 40. They gave him some oral glucose solution and improved slightly to 61. EMS then gave him some additional glucose or dextrose and he completely returned to baseline mental status. Upon arrival in the emergency department his fingerstick blood glucose is 81.  He says he feels fine. He has actually decreased his long long-acting insulin by 2 units recently and he took 8 units of long-acting of insulin before going to work. He did not have anything to eat but he says he drank some orange and pineapple juice after taking the insulin. He took the insulin a few hours ago.  He denies fever/chills, sore throat, chest pain, shortness of breath, nausea, vomiting, and abdominal pain. No recent trauma. No headache. No visual changes.           Past Medical History:  Diagnosis Date  . Arthritis    on operative finger  . Dental crowns present   . Diabetes type 1, uncontrolled (HCC)    IDDM (Dr. Eddie Dibbles @ [facility])  . Esophageal reflux   . Goiter 04/2012   no current med, has yearly monitoring  . High cholesterol   . HTN (hypertension)    under control with meds, has been on med x 2 yr.    Patient Active Problem List   Diagnosis Date Noted  . Stress  at home 04/27/2019  . Pedal edema 04/08/2017  . Abnormal thyroid function test 02/13/2017  . Health maintenance examination 01/23/2014  . Colloid thyroid nodule 05/05/2012  . Erectile dysfunction 04/19/2012  . Diabetes mellitus type 1, controlled ([place]) 10/22/2010  . Hyperlipidemia due to type 1 diabetes mellitus ([place]) 10/22/2010  . TOBACCO ABUSE 10/22/2010  . Essential hypertension 10/22/2010  . GERD 10/22/2010    Past Surgical History:  Procedure Laterality Date  . DISTAL INTERPHALANGEAL JOINT FUSION Right 03/05/2014   Procedure: DEBRIDEMENT (DIP) DISTAL INTERPHALANGEAL RIGHT MIDDLE FINGER;  Surgeon: Wynonia Sours, MD;  Location: [place];  Service: Orthopedics;  Laterality: Right;  . KNEE ARTHROSCOPY    . MASS EXCISION Right 03/05/2014   Procedure: EXCISION CYST ;  Surgeon: Wynonia Sours, MD;  Location: [place];  Service: Orthopedics;  Laterality: Right;  ANESTHESIA: IV REGINAL FAB  . OPEN REDUCTION NASAL FRACTURE  12/26/2008   with closure of nasal lac.  Marland Kitchen ORIF DISTAL RADIUS FRACTURE Right 12/26/2008  . ORIF WRIST FRACTURE Left 05/12/2016   takedown of nonunion/malunion and OPEN REDUCTION INTERNAL FIXATION (ORIF) WRIST FRACTURE;  Surgeon: Corky Mull, MD  . PERCUTANEOUS PINNING Left 02/06/2013   Procedure: PINNING PIP OF THE LEFT MIDDLE FINGER ;  Surgeon: Wynonia Sours, MD;  Location: [place];  Service: Orthopedics;  Laterality: Left;  . SKIN GRAFT SPLIT THICKNESS LEG /  FOOT Right 1985   thigh after trauma vs machine at work  . TRIGGER FINGER RELEASE  11/28/2012   Procedure: RELEASE TRIGGER FINGER/A-1 PULLEY;  Surgeon: Wynonia Sours, MD;  Laterality: Left;  EXCISION MASS LEFT RING FINGER, RELEASE A-1 PULLEY LEFT RING FINGER (ganglion cyst)    Prior to Admission medications   Medication Sig Start Date End Date Taking? Authorizing Provider  amLODipine (NORVASC) 5 MG tablet TAKE 1 TABLET BY MOUTH EVERY DAY 01/28/20   Ria Bush, MD    aspirin (ASPIRIN EC) 81 MG EC tablet Take 1 tablet (81 mg total) by mouth every Monday, Wednesday, and Friday. Swallow whole. 04/05/18   Ria Bush, MD  Blood Glucose Monitoring Suppl (ACCU-CHEK GUIDE) w/Device KIT See admin instructions. 02/09/19   [provider]  enalapril (VASOTEC) 20 MG tablet Take 1 tablet (20 mg total) by mouth 2 (two) times daily. 07/19/19   Venia Carbon, MD  furosemide (LASIX) 20 MG tablet TAKE 1 TABLET BY MOUTH ONCE DAILY AS NEEDED FOR FLUID 03/17/20   Ria Bush, MD  glucose blood (ONE TOUCH ULTRA TEST) test strip Use 6 (six) times daily 01/16/18   [provider]  insulin lispro (HUMALOG) 100 UNIT/ML injection 5-15 units before each meal based on carb intake and sugar 10/11/19   [provider]  Insulin NPH, Human,, Isophane, (HUMULIN N KWIKPEN) 100 UNIT/ML Kiwkpen Inject 9 Units into the skin 2 (two) times daily. 12/03/19   Ria Bush, MD  metoprolol tartrate (LOPRESSOR) 50 MG tablet Take 1 tablet (50 mg total) by mouth 2 (two) times daily. 07/19/19   Venia Carbon, MD  omeprazole (PRILOSEC) 40 MG capsule Take 1 capsule (40 mg total) by mouth 2 (two) times daily. 11/13/19   Ria Bush, MD  pravastatin (PRAVACHOL) 80 MG tablet TAKE 1 TABLET(80 MG) BY MOUTH DAILY 10/17/19   Ria Bush, MD    Allergies Insulin aspart, Spironolactone, Hydrochlorothiazide, and Morphine  Family History  Problem Relation Age of Onset  . Healthy Father   . Hypertension Mother   . Hyperlipidemia Mother   . Diabetes Maternal Uncle   . Coronary artery disease Maternal Uncle   . Stroke Maternal Aunt   . Diabetes Paternal Grandmother   . Diabetes Paternal Uncle     Social History Social History   Tobacco Use  . Smoking status: Current Every Day Smoker    Packs/day: 1.00    Years: 20.00    Pack years: 20.00    Types: Cigarettes  . Smokeless tobacco: Never Used  . Tobacco comment: smokes 1 pack a day  Substance Use  Topics  . Alcohol use: Yes    Alcohol/week: 7.0 standard drinks    Types: 7 Cans of beer per week    Comment:    . Drug use: No    Review of Systems Constitutional: No fever/chills. Hypoglycemia while at work. Eyes: No visual changes. ENT: No sore throat. Cardiovascular: Denies chest pain. Respiratory: Denies shortness of breath. Gastrointestinal: No abdominal pain.  No nausea, no vomiting.  No diarrhea.  No constipation. Genitourinary: Negative for dysuria. Musculoskeletal: Negative for neck pain.  Negative for back pain. Integumentary: Negative for rash. Neurological: Transient altered mental status, now at baseline. Negative for headaches, focal weakness or numbness.   ____________________________________________   PHYSICAL EXAM:  VITAL SIGNS: ED Triage Vitals  Enc Vitals Group     BP 04/04/20 0112 (!) 141/82     Pulse Rate 04/04/20 0112 86     Resp  04/04/20 0112 18     Temp --      Temp src --      SpO2 04/04/20 0112 98 %     Weight 04/04/20 0113 104.3 kg (230 lb)     Height 04/04/20 0113 1.88 m (6' 2")     Head Circumference --      Peak Flow --      Pain Score 04/04/20 0113 0     Pain Loc --      Pain Edu? --      Excl. in [place]? --     Constitutional: Alert and oriented.  Eyes: Conjunctivae are normal.  Head: Atraumatic. Nose: No congestion/rhinnorhea. Mouth/Throat: Patient is wearing a mask. Neck: No stridor.  No meningeal signs.   Cardiovascular: Normal rate, regular rhythm. Good peripheral circulation. Grossly normal heart sounds. Respiratory: Normal respiratory effort.  No retractions. Gastrointestinal: Soft and nontender. No distention.  Musculoskeletal: No lower extremity tenderness nor edema. No gross deformities of extremities. Neurologic:  Normal speech and language. No gross focal neurologic deficits are appreciated.  Skin:  Skin is warm, dry and intact. Psychiatric: Mood and affect are normal. Speech and behavior are  normal.  ____________________________________________   LABS (all labs ordered are listed, but only abnormal results are displayed)  Labs Reviewed  GLUCOSE, CAPILLARY  CBG MONITORING, ED   ____________________________________________  EKG  No indication for emergent EKG ____________________________________________  RADIOLOGY I, Hinda Kehr, personally viewed and evaluated these images (plain radiographs) as part of my medical decision making, as well as reviewing the written report by the radiologist.  ED MD interpretation: No indication for emergent imaging  Official radiology report(s): No results found.  ____________________________________________   PROCEDURES   Procedure(s) performed (including Critical Care):  Procedures   ____________________________________________   INITIAL IMPRESSION / MDM / [place] / ED COURSE  As part of my medical decision making, I reviewed the following data within the [place] notes reviewed and incorporated, Labs reviewed , Old chart reviewed, Notes from prior ED visits and [place] Cont[place] Substance Database   Differential diagnosis includes, but is not limited to, hypoglycemia, transient alteration of awareness, acute infection, medication or drug side effect.  The patient says he has been trying to decrease his insulin to avoid incidents like this. He denies drug and alcohol use. I counseled him about eating more substantial food after taking his long-term insulin. He initially declined any and all work-up but we negotiated to where he is going to eat a food tray and allow some observation to make sure his blood sugar is not continuing to drop given the long-acting insulin that he took. He is comfortable waiting but does not want any lab draw and I do not think it is necessary so I agreed with this plan. We will observe him after he eats for an hour or 2 or until he decides he does not want to  stay any longer and then I anticipate discharge with outpatient follow-up.       Clinical Course as of Apr 04 241  Fri Apr 04, 2020  0242 Patient's repeat blood sugar was 150.  He is adamant that he wants to leave and said he is going to leave whether we discharged him or not.  I printed up paperwork and he will be discharged as previously described.   [CF]    Clinical Course User Index [CF] Hinda Kehr, MD     ____________________________________________  FINAL CLINICAL IMPRESSION(S) /  ED DIAGNOSES  Final diagnoses:  Hypoglycemia  Transient alteration of awareness     MEDICATIONS GIVEN DURING THIS VISIT:  Medications - No data to display   ED Discharge Orders    None      *Please note:  Russell Gomez was evaluated in Emergency Department on 04/04/2020 for the symptoms described in the history of present illness. He was evaluated in the context of the global COVID-19 pandemic, which necessitated consideration that the patient might be at risk for infection with the SARS-CoV-2 virus that causes COVID-19. Institutional protocols and algorithms that pertain to the evaluation of patients at risk for COVID-19 are in a state of rapid change based on information released by regulatory bodies including the CDC and federal and state organizations. These policies and algorithms were followed during the patient's care in the ED.  Some ED evaluations and interventions may be delayed as a result of limited staffing during the pandemic.*  Note:  This document was prepared using Dragon voice recognition software and may include unintentional dictation errors.   Hinda Kehr, MD 04/04/20 [phone]

## 2020-04-04 NOTE — ED Notes (Addendum)
Current BG 81. Had to override as glucometer not recognizing pt's armband yet. Pt given food tray and drink per verbal from Velma. Pt currently A&Ox4 and cooperative. Given warm blanket. Bed locked low. Rail up. Denies any needs.

## 2020-04-04 NOTE — ED Notes (Signed)
Pt getting upset, cussing at staff.  States leaving either way in 20 minutes.  Patient refusing discharge vitals.  Pt ambulatory for discharge with steady gait, denies pain or other complaints, in NAD at this time.

## 2020-04-05 ENCOUNTER — Other Ambulatory Visit: Payer: Self-pay | Admitting: Family Medicine

## 2020-04-15 ENCOUNTER — Encounter: Payer: BC Managed Care – PPO | Admitting: Family Medicine

## 2020-04-16 ENCOUNTER — Ambulatory Visit (INDEPENDENT_AMBULATORY_CARE_PROVIDER_SITE_OTHER)
Admission: RE | Admit: 2020-04-16 | Discharge: 2020-04-16 | Disposition: A | Discharge status: Home / Self Care | Payer: BC Managed Care – PPO | Source: Ambulatory Visit | Attending: Family Medicine | Admitting: Family Medicine

## 2020-04-16 ENCOUNTER — Encounter: Payer: Self-pay | Admitting: Family Medicine

## 2020-04-16 ENCOUNTER — Other Ambulatory Visit: Payer: Self-pay

## 2020-04-16 ENCOUNTER — Ambulatory Visit (INDEPENDENT_AMBULATORY_CARE_PROVIDER_SITE_OTHER): Payer: BC Managed Care – PPO | Admitting: Family Medicine

## 2020-04-16 VITALS — BP 144/80 | HR 92 | Temp 97.8°F | Ht 71.5 in | Wt 229.1 lb

## 2020-04-16 DIAGNOSIS — F17200 Nicotine dependence, unspecified, uncomplicated: Secondary | ICD-10-CM | POA: Diagnosis not present

## 2020-04-16 DIAGNOSIS — D539 Nutritional anemia, unspecified: Secondary | ICD-10-CM | POA: Diagnosis not present

## 2020-04-16 DIAGNOSIS — Z1211 Encounter for screening for malignant neoplasm of colon: Secondary | ICD-10-CM

## 2020-04-16 DIAGNOSIS — I10 Essential (primary) hypertension: Secondary | ICD-10-CM

## 2020-04-16 DIAGNOSIS — K219 Gastro-esophageal reflux disease without esophagitis: Secondary | ICD-10-CM

## 2020-04-16 DIAGNOSIS — Z125 Encounter for screening for malignant neoplasm of prostate: Secondary | ICD-10-CM

## 2020-04-16 DIAGNOSIS — Z0001 Encounter for general adult medical examination with abnormal findings: Secondary | ICD-10-CM | POA: Diagnosis not present

## 2020-04-16 DIAGNOSIS — R0600 Dyspnea, unspecified: Secondary | ICD-10-CM | POA: Diagnosis not present

## 2020-04-16 DIAGNOSIS — Z658 Other specified problems related to psychosocial circumstances: Secondary | ICD-10-CM

## 2020-04-16 DIAGNOSIS — E1069 Type 1 diabetes mellitus with other specified complication: Secondary | ICD-10-CM

## 2020-04-16 DIAGNOSIS — E785 Hyperlipidemia, unspecified: Secondary | ICD-10-CM

## 2020-04-16 DIAGNOSIS — R946 Abnormal results of thyroid function studies: Secondary | ICD-10-CM

## 2020-04-16 DIAGNOSIS — R600 Localized edema: Secondary | ICD-10-CM

## 2020-04-16 DIAGNOSIS — R9431 Abnormal electrocardiogram [ECG] [EKG]: Secondary | ICD-10-CM

## 2020-04-16 DIAGNOSIS — N529 Male erectile dysfunction, unspecified: Secondary | ICD-10-CM | POA: Diagnosis not present

## 2020-04-16 LAB — POC URINALSYSI DIPSTICK (AUTOMATED)
Blood, UA: NEGATIVE
Glucose, UA: NEGATIVE
Leukocytes, UA: NEGATIVE
Nitrite, UA: NEGATIVE
Protein, UA: POSITIVE — AB
Spec Grav, UA: 1.015 (ref 1.010–1.025)
Urobilinogen, UA: 1 E.U./dL
pH, UA: 6 (ref 5.0–8.0)

## 2020-04-16 LAB — CBC WITH DIFFERENTIAL/PLATELET
Basophils Absolute: 0.1 10*3/uL (ref 0.0–0.1)
Basophils Relative: 0.5 % (ref 0.0–3.0)
Eosinophils Absolute: 0.1 10*3/uL (ref 0.0–0.7)
Eosinophils Relative: 1 % (ref 0.0–5.0)
HCT: 41.7 % (ref 39.0–52.0)
Hemoglobin: 14.2 g/dL (ref 13.0–17.0)
Lymphocytes Relative: 26.7 % (ref 12.0–46.0)
Lymphs Abs: 2.7 10*3/uL (ref 0.7–4.0)
MCHC: 34.1 g/dL (ref 30.0–36.0)
MCV: 102.7 fl — ABNORMAL HIGH (ref 78.0–100.0)
Monocytes Absolute: 0.7 10*3/uL (ref 0.1–1.0)
Monocytes Relative: 7.2 % (ref 3.0–12.0)
Neutro Abs: 6.5 10*3/uL (ref 1.4–7.7)
Neutrophils Relative %: 64.6 % (ref 43.0–77.0)
Platelets: 249 10*3/uL (ref 150.0–400.0)
RBC: 4.06 Mil/uL — ABNORMAL LOW (ref 4.22–5.81)
RDW: 15.6 % — ABNORMAL HIGH (ref 11.5–15.5)
WBC: 10 10*3/uL (ref 4.0–10.5)

## 2020-04-16 LAB — COMPREHENSIVE METABOLIC PANEL
ALT: 21 U/L (ref 0–53)
AST: 38 U/L — ABNORMAL HIGH (ref 0–37)
Albumin: 3.9 g/dL (ref 3.5–5.2)
Alkaline Phosphatase: 144 U/L — ABNORMAL HIGH (ref 39–117)
BUN: 14 mg/dL (ref 6–23)
CO2: 36 mEq/L — ABNORMAL HIGH (ref 19–32)
Calcium: 8.1 mg/dL — ABNORMAL LOW (ref 8.4–10.5)
Chloride: 92 mEq/L — ABNORMAL LOW (ref 96–112)
Creatinine, Ser: 1.19 mg/dL (ref 0.40–1.50)
GFR: 62.89 mL/min (ref >60.00)
Glucose, Bld: 58 mg/dL — ABNORMAL LOW (ref 70–99)
Potassium: 2.9 mEq/L — ABNORMAL LOW (ref 3.5–5.1)
Sodium: 139 mEq/L (ref 135–145)
Total Bilirubin: 0.9 mg/dL (ref 0.2–1.2)
Total Protein: 6.7 g/dL (ref 6.0–8.3)

## 2020-04-16 LAB — FOLATE: Folate: 2.6 ng/mL — ABNORMAL LOW (ref >5.9)

## 2020-04-16 LAB — LIPID PANEL
Cholesterol: 207 mg/dL — ABNORMAL HIGH (ref 0–200)
HDL: 134.8 mg/dL (ref >39.00)
LDL Cholesterol: 36 mg/dL (ref 0–99)
NonHDL: 72.08
Total CHOL/HDL Ratio: 2
Triglycerides: 181 mg/dL — ABNORMAL HIGH (ref 0.0–149.0)
VLDL: 36.2 mg/dL (ref 0.0–40.0)

## 2020-04-16 LAB — PSA: PSA: 0.46 ng/mL (ref 0.10–4.00)

## 2020-04-16 LAB — T4, FREE: Free T4: 0.96 ng/dL (ref 0.60–1.60)

## 2020-04-16 LAB — VITAMIN D 25 HYDROXY (VIT D DEFICIENCY, FRACTURES): VITD: 13.83 ng/mL — ABNORMAL LOW (ref 30.00–100.00)

## 2020-04-16 LAB — MAGNESIUM: Magnesium: 1.2 mg/dL — ABNORMAL LOW (ref 1.5–2.5)

## 2020-04-16 LAB — VITAMIN B12: Vitamin B-12: 171 pg/mL — ABNORMAL LOW (ref 211–911)

## 2020-04-16 LAB — TSH: TSH: 0.24 u[IU]/mL — ABNORMAL LOW (ref 0.35–4.50)

## 2020-04-16 MED ORDER — SILDENAFIL CITRATE 50 MG PO TABS
50.0000 mg | ORAL_TABLET | Freq: Every day | ORAL | 0 refills | Status: DC | PRN
Start: 2020-04-16 — End: 2020-07-24

## 2020-04-16 NOTE — Patient Instructions (Addendum)
EKG today Labs today  Urinalysis today  Chest xray today. We will refer you for lung cancer screening CT program.  Work on quitting smoking. I do recommend COVID vaccine.  Pending results we may check liver ultrasound.  Expect a call to schedule colonoscopy over the next few months.  Trial viagra 50mg  daily as needed for ED.   Health Maintenance, Male Adopting a healthy lifestyle and getting preventive care are important in promoting health and wellness. Ask your health care provider about:  The right schedule for you to have regular tests and exams.  Things you can do on your own to prevent diseases and keep yourself healthy. What should I know about diet, weight, and exercise? Eat a healthy diet   Eat a diet that includes plenty of vegetables, fruits, low-fat dairy products, and lean protein.  Do not eat a lot of foods that are high in solid fats, added sugars, or sodium. Maintain a healthy weight Body mass index (BMI) is a measurement that can be used to identify possible weight problems. It estimates body fat based on height and weight. Your health care provider can help determine your BMI and help you achieve or maintain a healthy weight. Get regular exercise Get regular exercise. This is one of the most important things you can do for your health. Most adults should:  Exercise for at least 150 minutes each week. The exercise should increase your heart rate and make you sweat (moderate-intensity exercise).  Do strengthening exercises at least twice a week. This is in addition to the moderate-intensity exercise.  Spend less time sitting. Even light physical activity can be beneficial. Watch cholesterol and blood lipids Have your blood tested for lipids and cholesterol at 58 years of age, then have this test every 5 years. You may need to have your cholesterol levels checked more often if:  Your lipid or cholesterol levels are high.  You are older than 58 years of age.  You  are at high risk for heart disease. What should I know about cancer screening? Many types of cancers can be detected early and may often be prevented. Depending on your health history and family history, you may need to have cancer screening at various ages. This may include screening for:  Colorectal cancer.  Prostate cancer.  Skin cancer.  Lung cancer. What should I know about heart disease, diabetes, and high blood pressure? Blood pressure and heart disease  High blood pressure causes heart disease and increases the risk of stroke. This is more likely to develop in people who have high blood pressure readings, are of African descent, or are overweight.  Talk with your health care provider about your target blood pressure readings.  Have your blood pressure checked: ? Every 3-5 years if you are 30-42 years of age. ? Every year if you are 58 years old or older.  If you are between the ages of 32 and 2 and are a current or former smoker, ask your health care provider if you should have a one-time screening for abdominal aortic aneurysm (AAA). Diabetes Have regular diabetes screenings. This checks your fasting blood sugar level. Have the screening done:  Once every three years after age 39 if you are at a normal weight and have a low risk for diabetes.  More often and at a younger age if you are overweight or have a high risk for diabetes. What should I know about preventing infection? Hepatitis B If you have a higher risk  for hepatitis B, you should be screened for this virus. Talk with your health care provider to find out if you are at risk for hepatitis B infection. Hepatitis C Blood testing is recommended for:  Everyone born from [birth year] through 1964.  Anyone with known risk factors for hepatitis C. Sexually transmitted infections (STIs)  You should be screened each year for STIs, including gonorrhea and chlamydia, if: ? You are sexually active and are younger than 58 years  of age. ? You are older than 58 years of age and your health care provider tells you that you are at risk for this type of infection. ? Your sexual activity has changed since you were last screened, and you are at increased risk for chlamydia or gonorrhea. Ask your health care provider if you are at risk.  Ask your health care provider about whether you are at high risk for HIV. Your health care provider may recommend a prescription medicine to help prevent HIV infection. If you choose to take medicine to prevent HIV, you should first get tested for HIV. You should then be tested every 3 months for as long as you are taking the medicine. Follow these instructions at home: Lifestyle  Do not use any products that contain nicotine or tobacco, such as cigarettes, e-cigarettes, and chewing tobacco. If you need help quitting, ask your health care provider.  Do not use street drugs.  Do not share needles.  Ask your health care provider for help if you need support or information about quitting drugs. Alcohol use  Do not drink alcohol if your health care provider tells you not to drink.  If you drink alcohol: ? Limit how much you have to 0-2 drinks a day. ? Be aware of how much alcohol is in your drink. In the [place] one drink equals one 12 oz bottle of beer (355 mL), one 5 oz glass of wine (148 mL), or one 1 oz glass of hard liquor (44 mL). General instructions  Schedule regular health, dental, and eye exams.  Stay current with your vaccines.  Tell your health care provider if: ? You often feel depressed. ? You have ever been abused or do not feel safe at home. Summary  Adopting a healthy lifestyle and getting preventive care are important in promoting health and wellness.  Follow your health care provider's instructions about healthy diet, exercising, and getting tested or screened for diseases.  Follow your health care provider's instructions on monitoring your cholesterol and blood  pressure. This information is not intended to replace advice given to you by your health care provider. Make sure you discuss any questions you have with your health care provider. Document Revised: 10/18/2018 Document Reviewed: 10/18/2018 Elsevier Patient Education  2020 [employer].

## 2020-04-16 NOTE — Assessment & Plan Note (Addendum)
Preventative protocols reviewed and updated unless pt declined. Discussed healthy diet and lifestyle.  Will refer for colonoscopy as due - he agrees to complete over the next few months.  Interested in lung cancer screening program - will refer.  I did recommend he get COVID vaccine.

## 2020-04-16 NOTE — Progress Notes (Signed)
This visit was conducted in person.  BP (!) 144/80 (BP Location: Right Arm, Patient Position: Sitting, Cuff Size: Large)   Pulse 92   Temp 97.8 F (36.6 C) (Temporal)   Ht 5' 11.5" (1.816 m)   Wt 229 lb 2 oz (103.9 kg)   SpO2 95%   BMI 31.51 kg/m   On retesting 150/90.  CC: CPE Subjective:    Patient ID: Russell Gomez, male    DOB: [birth year], 59 y.o.   MRN: [id number]  HPI: Russell Gomez is a 59 y.o. male presenting on 04/16/2020 for Annual Exam   Ongoing leg swelling despite lasix. He continues enalapril 54m bid, metoprolol 5362mbid, amlodipine 62m862maily and omeprazole 70m70md. Dependent edema, better when he first wakes up, then progressive throughout his work day. Continues working 3rd shift.   Hypoglycemic episode 2 wks ago at work s/p ER eval. DM [facility] 93mose[facility] last seen 05/2019. Attributes fluctuating sugars to stress, poor diet, and recent separation (6 months ago).   Notes increasing cough, wheeze. 30 lb weight loss after separation.   Worsening ED for 1 year. Trouble obtaining and maintaining erection. Previously tried viagra with benefit.   Preventative: Colon cancer screening - pos iFOB 01/2014- pt attributed to hemorrhoids, declined colonoscopy. Has not completed subsequent iFOB after this. Agrees to colonoscopy - referral placed.  Prostate cancer screening - discussed,declines.Strong stream, nocturia x1. Lung cancer screening -interested. 40 PY hx.  Flu - declines  Pneumovax - 02/2015 Td 2010, Tdap 11/2017 COVID vaccine - declines Seat belt use discussed Sunscreen use discussed, no changing moles on skin.  Smoker - 1 ppd for ~40 years.  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 Interim medical history since our last visit reviewed. Allergies and medications reviewed and updated. Outpatient Medications Prior to Visit  Medication Sig Dispense Refill  . amLODipine (NORVASC) 5 MG tablet TAKE 1 TABLET BY MOUTH EVERY DAY 90 tablet 2  . aspirin (ASPIRIN EC) 81 MG EC tablet Take 1 tablet (81 mg total) by mouth every Monday, Wednesday, and Friday. Swallow whole. 30 tablet   . Blood Glucose Monitoring Suppl (ACCU-CHEK GUIDE) w/Device KIT See admin instructions.    . enalapril (VASOTEC) 20 MG tablet Take 1 tablet (20 mg total) by mouth 2 (two) times daily. 90 tablet 3  . furosemide (LASIX) 20 MG tablet TAKE 1 TABLET BY MOUTH ONCE DAILY AS NEEDED FOR FLUID 30 tablet 0  . glucose blood (ONE TOUCH ULTRA TEST) test strip Use 6 (six) times daily    . insulin lispro (HUMALOG) 100 UNIT/ML injection 5-15 units before each meal based on carb intake and sugar    . Insulin NPH, Human,, Isophane, (HUMULIN N KWIKPEN) 100 UNIT/ML Kiwkpen Inject 9 Units into the skin 2 (two) times daily.    . metoprolol tartrate (LOPRESSOR) 50 MG tablet Take 1 tablet (50 mg total) by mouth 2 (two) times daily. 180 tablet 3  . omeprazole (PRILOSEC) 40 MG capsule Take 1 capsule by mouth twice daily 28 capsule 0  . pravastatin (PRAVACHOL) 80 MG tablet TAKE 1 TABLET(80 MG) BY MOUTH  DAILY 90 tablet 2   No facility-administered medications prior to visit.     Per HPI unless specifically indicated in ROS section below Review of Systems  Constitutional: Positive for appetite change and unexpected weight change. Negative for activity change, chills, fatigue and fever.  HENT: Negative for hearing loss.   Eyes: Negative for visual disturbance.  Respiratory: Positive for cough, shortness of breath and wheezing. Negative for chest tightness.   Cardiovascular: Positive for leg swelling.  Negative for chest pain and palpitations.  Gastrointestinal: Positive for nausea. Negative for abdominal distention, abdominal pain, blood in stool, constipation, diarrhea and vomiting.  Genitourinary: Negative for difficulty urinating and hematuria.  Musculoskeletal: Negative for arthralgias, myalgias and neck pain.  Skin: Negative for rash.  Neurological: Negative for dizziness, seizures, syncope and headaches.  Hematological: Negative for adenopathy. Does not bruise/bleed easily.  Psychiatric/Behavioral: Negative for dysphoric mood. The patient is not nervous/anxious.    Objective:  BP (!) 144/80 (BP Location: Right Arm, Patient Position: Sitting, Cuff Size: Large)   Pulse 92   Temp 97.8 F (36.6 C) (Temporal)   Ht 5' 11.5" (1.816 m)   Wt 229 lb 2 oz (103.9 kg)   SpO2 95%   BMI 31.51 kg/m   Wt Readings from Last 3 Encounters:  04/16/20 229 lb 2 oz (103.9 kg)  04/04/20 230 lb (104.3 kg)  12/03/19 248 lb 6 oz (112.7 kg)      Physical Exam Vitals and nursing note reviewed.  Constitutional:      General: He is not in acute distress.    Appearance: Normal appearance. He is well-developed. He is not ill-appearing.  HENT:     Head: Normocephalic and atraumatic.     Right Ear: Hearing, tympanic membrane, ear canal and external ear normal.     Left Ear: Hearing, tympanic membrane, ear canal and external ear normal.  Eyes:     General: No scleral icterus.    Extraocular Movements: Extraocular movements intact.     Conjunctiva/sclera: Conjunctivae normal.     Pupils: Pupils are equal, round, and reactive to light.  Cardiovascular:     Rate and Rhythm: Normal rate and regular rhythm.     Pulses: Normal pulses.          Radial pulses are 2+ on the right side and 2+ on the left side.     Heart sounds: Normal heart sounds. No murmur.  Pulmonary:     Effort: Pulmonary effort is normal. No respiratory distress.     Breath sounds: Normal breath sounds. No wheezing, rhonchi or rales.      Comments: Lungs overall clear Abdominal:     General: Abdomen is flat. Bowel sounds are normal. There is no distension.     Palpations: Abdomen is soft. There is no mass.     Tenderness: There is no abdominal tenderness. There is no guarding or rebound.     Hernia: No hernia is present.  Musculoskeletal:        General: Normal range of motion.     Cervical back: Normal range of motion and neck supple.     Right lower leg: Edema (tr-1+) present.     Left lower leg: Edema (tr-1+) present.  Lymphadenopathy:     Cervical: No cervical adenopathy.  Skin:    General: Skin is warm and dry.     Findings: No rash.  Neurological:     General: No focal deficit present.     Mental Status: He is alert and  oriented to person, place, and time.     Comments: CN grossly intact, station and gait intact  Psychiatric:        Mood and Affect: Mood normal.        Behavior: Behavior normal.        Thought Content: Thought content normal.        Judgment: Judgment normal.       Results for orders placed or performed in visit on 04/16/20  PSA  Result Value Ref Range   PSA 0.46 0.10 - 4.00 ng/mL  Parathyroid hormone, intact (no Ca)  Result Value Ref Range   PTH 104 (H) 14 - 64 pg/mL  VITAMIN D 25 Hydroxy (Vit-D Deficiency, Fractures)  Result Value Ref Range   VITD 13.83 (L) 30.00 - 100.00 ng/mL  T4, free  Result Value Ref Range   Free T4 0.96 0.60 - 1.60 ng/dL  Magnesium  Result Value Ref Range   Magnesium 1.2 (L) 1.5 - 2.5 mg/dL  Lipid panel  Result Value Ref Range   Cholesterol 207 (H) 0 - 200 mg/dL   Triglycerides 181.0 (H) 0 - 149 mg/dL   HDL 134.80 >39.00 mg/dL   VLDL 36.2 0.0 - 40.0 mg/dL   LDL Cholesterol 36 0 - 99 mg/dL   Total CHOL/HDL Ratio 2    NonHDL 72.08   Comprehensive metabolic panel  Result Value Ref Range   Sodium 139 135 - 145 mEq/L   Potassium 2.9 (L) 3.5 - 5.1 mEq/L   Chloride 92 (L) 96 - 112 mEq/L   CO2 36 (H) 19 - 32 mEq/L   Glucose, Bld 58 (L) 70 - 99 mg/dL     BUN 14 6 - 23 mg/dL   Creatinine, Ser 1.19 0.40 - 1.50 mg/dL   Total Bilirubin 0.9 0.2 - 1.2 mg/dL   Alkaline Phosphatase 144 (H) 39 - 117 U/L   AST 38 (H) 0 - 37 U/L   ALT 21 0 - 53 U/L   Total Protein 6.7 6.0 - 8.3 g/dL   Albumin 3.9 3.5 - 5.2 g/dL   GFR 62.89 >60.00 mL/min   Calcium 8.1 (L) 8.4 - 10.5 mg/dL  CBC with Differential/Platelet  Result Value Ref Range   WBC 10.0 4.0 - 10.5 K/uL   RBC 4.06 (L) 4.22 - 5.81 Mil/uL   Hemoglobin 14.2 13.0 - 17.0 g/dL   HCT 41.7 39 - 52 %   MCV 102.7 (H) 78.0 - 100.0 fl   MCHC 34.1 30.0 - 36.0 g/dL   RDW 15.6 (H) 11.5 - 15.5 %   Platelets 249.0 150 - 400 K/uL   Neutrophils Relative % 64.6 43 - 77 %   Lymphocytes Relative 26.7 12 - 46 %   Monocytes Relative 7.2 3 - 12 %   Eosinophils Relative 1.0 0 - 5 %   Basophils Relative 0.5 0 - 3 %   Neutro Abs 6.5 1.4 - 7.7 K/uL   Lymphs Abs 2.7 0.7 - 4.0 K/uL   Monocytes Absolute 0.7 0 - 1 K/uL   Eosinophils Absolute 0.1 0 - 0 K/uL   Basophils Absolute 0.1 0 - 0 K/uL  TSH  Result Value Ref Range   TSH 0.24 (L) 0.35 - 4.50 uIU/mL  Vitamin B12  Result Value Ref Range   Vitamin B-12 171 (L) 211 - 911 pg/mL  Folate  Result Value Ref Range   Folate 2.6 (L) >5.9 ng/mL  POCT Urinalysis Dipstick (Automated)  Result Value Ref Range   Color,  UA dark yellow    Clarity, UA clear    Glucose, UA Negative Negative   Bilirubin, UA 1+    Ketones, UA +/-    Spec Grav, UA 1.015 1.010 - 1.025   Blood, UA negative    pH, UA 6.0 5.0 - 8.0   Protein, UA Positive (A) Negative   Urobilinogen, UA 1.0 0.2 or 1.0 E.U./dL   Nitrite, UA negative    Leukocytes, UA Negative Negative   Lab Results  Component Value Date   HGBA1C 7.5 (H) 12/03/2019    Depression screen [facility] 2/9 04/16/2020 04/16/2019 02/01/2018  Decreased Interest 0 0 0  Down, Depressed, Hopeless 0 0 0  PHQ - 2 Score 0 0 0    EKG - poor quality - fibrillations at limb leads with regular QRS anticipate NSR at 90s, normal axis, intervals, no acute  ST/T changes but diffuse T wave flattening throughout  Assessment & Plan:  This visit occurred during the SARS-CoV-2 public health emergency.  Safety protocols were in place, including screening questions prior to the visit, additional usage of staff PPE, and extensive cleaning of exam room while observing appropriate contact time as indicated for disinfecting solutions.   Problem List Items Addressed This Visit    TOBACCO ABUSE    Continue to encourage cessation. Precontemplative.  Will refer for lung cancer screening program. Will update CXR in setting of smoking history and recent weight loss       Relevant Orders   DG Chest 2 View (Completed)   POCT Urinalysis Dipstick (Automated) (Completed)   Ambulatory Referral for Lung Cancer Scre   Psychosocial stressors    Ongoing - 3rd shift, recent separation from wife. Poor diet - motivated to restart healthy diet choices.       Pedal edema    Chronic, ongoing. Previously thought related to sodium intake. He is on amlodipine. Will repeat labwork for further evaluation, consider abd [place] to evaluate for liver disease.       Macrocytic anemia    Update anemia panel. Endorses 2-3 alcoholic beverages/day.       Relevant Orders   Vitamin B12 (Completed)   Folate (Completed)   Vitamin B1   Hyperlipidemia due to type 1 diabetes mellitus (HCC)    Chronic, check FLP on pravastatin 59m daily.  The ASCVD Risk score (Mikey BussingDC Jr., et al., 2013) failed to calculate for the following reasons:   The valid HDL cholesterol range is 20 to 100 mg/dL       Relevant Medications   sildenafil (VIAGRA) 50 MG tablet   GERD    Continues BID PPI.       Essential hypertension    Chronic, elevated readings today despite regimen. No changes today.       Relevant Medications   sildenafil (VIAGRA) 50 MG tablet   Erectile dysfunction    Worsening for the past year. Anticipate organic cause. Reviewed contributions of HTN DM and HLD. Trial viagra,  precautions reviewed.  Check EKG for new baseline.       Relevant Orders   EKG 12-Lead (Completed)   Encounter for general adult medical examination with abnormal findings - Primary    Preventative protocols reviewed and updated unless pt declined. Discussed healthy diet and lifestyle.  Will refer for colonoscopy as due - he agrees to complete over the next few months.  Interested in lung cancer screening program - will refer.  I did recommend he get COVID vaccine.  Relevant Orders   EKG 12-Lead (Completed)   Diabetes mellitus type 1, controlled ([place])    Continue endo f/u on humalog and NPH.  Reviewed recent ER visit for hypoglycemia.       Abnormal thyroid function test    Update labs. ?h/o subclinical hyperthyroidism. Sees endo. H/o chronically suppressed TSH but with otherwise normal thyroid hormones.       Abnormal EKG    EKG - poor quality - fibrillations at limb leads with regular QRS anticipate NSR at 90s, normal axis, intervals, no acute ST/T changes but diffuse T wave flattening throughout  Consider rpt EKG at next visit.        Other Visit Diagnoses    Special screening for malignant neoplasms, colon       Relevant Orders   Ambulatory referral to Gastroenterology   Special screening for malignant neoplasm of prostate       Hypocalcemia           Meds ordered this encounter  Medications  . sildenafil (VIAGRA) 50 MG tablet    Sig: Take 1 tablet (50 mg total) by mouth daily as needed for erectile dysfunction.    Dispense:  10 tablet    Refill:  0   Orders Placed This Encounter  Procedures  . DG Chest 2 View    Standing Status:   Future    Number of Occurrences:   1    Standing Expiration Date:   04/16/2021    Order Specific Question:   Reason for Exam (SYMPTOM  OR DIAGNOSIS REQUIRED)    Answer:   smoker, worsening dyspnea    Order Specific Question:   Preferred imaging location?    Answer:   [facility]    Order Specific Question:    Radiology Contrast Protocol - do NOT remove file path    Answer:   [email or web address]  . Vitamin B12  . Folate  . Vitamin B1  . Ambulatory referral to Gastroenterology    Referral Priority:   Routine    Referral Type:   Consultation    Referral Reason:   Specialty Services Required    Number of Visits Requested:   1  . Ambulatory Referral for Lung Cancer Scre    Referral Priority:   Routine    Referral Type:   Consultation    Referral Reason:   Specialty Services Required    Number of Visits Requested:   1  . POCT Urinalysis Dipstick (Automated)  . EKG 12-Lead    Patient instructions: EKG today Labs today  Urinalysis today  Chest xray today. We will refer you for lung cancer screening CT program.  Work on quitting smoking. I do recommend COVID vaccine.  Pending results we may check liver ultrasound.  Expect a call to schedule colonoscopy over the next few months.  Trial viagra 36m daily as needed for ED.   Follow up plan: Return in about 3 months (around 07/17/2020), or if symptoms worsen or fail to improve, for follow up visit.  JRia Bush MD

## 2020-04-16 NOTE — Assessment & Plan Note (Addendum)
Worsening for the past year. Anticipate organic cause. Reviewed contributions of HTN DM and HLD. Trial viagra, precautions reviewed.  Check EKG for new baseline.

## 2020-04-19 ENCOUNTER — Encounter: Payer: Self-pay | Admitting: Family Medicine

## 2020-04-19 ENCOUNTER — Other Ambulatory Visit: Payer: Self-pay | Admitting: Family Medicine

## 2020-04-19 DIAGNOSIS — E1069 Type 1 diabetes mellitus with other specified complication: Secondary | ICD-10-CM | POA: Insufficient documentation

## 2020-04-19 DIAGNOSIS — E559 Vitamin D deficiency, unspecified: Secondary | ICD-10-CM | POA: Insufficient documentation

## 2020-04-19 DIAGNOSIS — D539 Nutritional anemia, unspecified: Secondary | ICD-10-CM | POA: Insufficient documentation

## 2020-04-19 DIAGNOSIS — E538 Deficiency of other specified B group vitamins: Secondary | ICD-10-CM

## 2020-04-19 DIAGNOSIS — R600 Localized edema: Secondary | ICD-10-CM

## 2020-04-19 DIAGNOSIS — E1021 Type 1 diabetes mellitus with diabetic nephropathy: Secondary | ICD-10-CM

## 2020-04-19 DIAGNOSIS — R634 Abnormal weight loss: Secondary | ICD-10-CM

## 2020-04-19 DIAGNOSIS — R809 Proteinuria, unspecified: Secondary | ICD-10-CM | POA: Insufficient documentation

## 2020-04-19 DIAGNOSIS — E785 Hyperlipidemia, unspecified: Secondary | ICD-10-CM | POA: Insufficient documentation

## 2020-04-19 DIAGNOSIS — E213 Hyperparathyroidism, unspecified: Secondary | ICD-10-CM

## 2020-04-19 DIAGNOSIS — R9431 Abnormal electrocardiogram [ECG] [EKG]: Secondary | ICD-10-CM | POA: Insufficient documentation

## 2020-04-19 HISTORY — DX: Deficiency of other specified B group vitamins: E53.8

## 2020-04-19 HISTORY — DX: Vitamin D deficiency, unspecified: E55.9

## 2020-04-19 HISTORY — DX: Hyperparathyroidism, unspecified: E21.3

## 2020-04-19 LAB — PARATHYROID HORMONE, INTACT (NO CA): PTH: 104 pg/mL — ABNORMAL HIGH (ref 14–64)

## 2020-04-19 LAB — VITAMIN B1: Vitamin B1 (Thiamine): 7 nmol/L — ABNORMAL LOW (ref 8–30)

## 2020-04-19 MED ORDER — MAGNESIUM 500 MG PO CAPS: 1.0000 | ORAL_CAPSULE | Freq: Every day | ORAL | 11 refills | Status: DC

## 2020-04-19 MED ORDER — B-12 1000 MCG SL SUBL: 1.0000 | SUBLINGUAL_TABLET | Freq: Every day | SUBLINGUAL | 11 refills | Status: DC

## 2020-04-19 MED ORDER — POTASSIUM CHLORIDE CRYS ER 20 MEQ PO TBCR: 20.0000 meq | EXTENDED_RELEASE_TABLET | Freq: Every day | ORAL | 1 refills | Status: DC

## 2020-04-19 MED ORDER — FOLIC ACID 1 MG PO TABS
1.0000 mg | ORAL_TABLET | Freq: Every day | ORAL | 11 refills | Status: DC
Start: 2020-04-19 — End: 2021-05-01

## 2020-04-19 MED ORDER — VITAMIN D3 1.25 MG (50000 UT) PO TABS: 1.0000 | ORAL_TABLET | ORAL | 1 refills | Status: DC

## 2020-04-19 NOTE — Assessment & Plan Note (Signed)
Chronic, check FLP on pravastatin 80mg  daily.  The ASCVD Risk score Mikey Bussing DC Jr., et al., 2013) failed to calculate for the following reasons:   The valid HDL cholesterol range is 20 to 100 mg/dL

## 2020-04-19 NOTE — Assessment & Plan Note (Signed)
Ongoing - 3rd shift, recent separation from wife. Poor diet - motivated to restart healthy diet choices.

## 2020-04-19 NOTE — Assessment & Plan Note (Signed)
Continues BID PPI.  

## 2020-04-19 NOTE — Assessment & Plan Note (Addendum)
Continue to encourage cessation. Precontemplative.  Will refer for lung cancer screening program. Will update CXR in setting of smoking history and recent weight loss

## 2020-04-19 NOTE — Assessment & Plan Note (Signed)
Update anemia panel. Endorses 2-3 alcoholic beverages/day.

## 2020-04-19 NOTE — Assessment & Plan Note (Addendum)
Continue endo f/u on humalog and NPH.  Reviewed recent ER visit for hypoglycemia.

## 2020-04-19 NOTE — Assessment & Plan Note (Signed)
Chronic, elevated readings today despite regimen. No changes today.

## 2020-04-19 NOTE — Assessment & Plan Note (Addendum)
Chronic, ongoing. Previously thought related to sodium intake. He is on amlodipine. Will repeat labwork for further evaluation, consider abd [place] to evaluate for liver disease.

## 2020-04-19 NOTE — Assessment & Plan Note (Addendum)
Update labs. ?h/o subclinical hyperthyroidism. Sees endo. H/o chronically suppressed TSH but with otherwise normal thyroid hormones.

## 2020-04-19 NOTE — Progress Notes (Signed)
Plz notify labs returned abnormal -  1. Vitamins were low - D, folate, B12 - recommend start supplementation of all of these (vitamin D weekly prescription sent to pharmacy, Vit B12 1073mcg daily OTC, folate 1mg  daily OTC). Vit B1 pending. Would offer B12 shot monthly for 6 months to speed replacement.  2. Potassium very low and magnesium and calcium were low - start Mg 500mg  daily and Klor-con 75mEq tablet daily sent to pharmacy. Increase calcium in diet (dairy, leafy greens). Will likely need to continue potassium while taking lasix.  3. Sugar was very low - rec f/u with endo for insulin management.  4. Kidneys and liver were slightly worse than prior, parathyroid hormone was elevated - will recheck at next visit. Will proceed with abdominal ultrasound to further evaluate kidneys and liver. Recommend 1 mo f/u visit instead of waiting 3 months to recheck some of these labs. Sooner if worsening.

## 2020-04-19 NOTE — Assessment & Plan Note (Signed)
EKG - poor quality - fibrillations at limb leads with regular QRS anticipate NSR at 90s, normal axis, intervals, no acute ST/T changes but diffuse T wave flattening throughout  Consider rpt EKG at next visit.

## 2020-04-20 ENCOUNTER — Other Ambulatory Visit: Payer: Self-pay | Admitting: Family Medicine

## 2020-04-20 MED ORDER — THIAMINE HCL 100 MG PO TABS
100.0000 mg | ORAL_TABLET | Freq: Every day | ORAL | 6 refills | Status: DC
Start: 2020-04-20 — End: 2020-12-27

## 2020-04-21 ENCOUNTER — Telehealth: Payer: Self-pay

## 2020-04-21 NOTE — Telephone Encounter (Signed)
Left message for patient to call the office to discuss results and recommendations.

## 2020-04-21 NOTE — Telephone Encounter (Signed)
-----   Message from Ria Bush, MD sent at 04/19/2020 11:38 AM EDT ----- Plz notify xray returned ok. EKG returned somewhat abnormal but I think this partly due to motion artifact. Recommend we repeat EKG at next visit. Let us know sooner if chest pain, dizziness, palpitations develop.

## 2020-04-21 NOTE — Telephone Encounter (Signed)
Patient has been notified and verbalized understanding 

## 2020-04-22 ENCOUNTER — Encounter: Payer: Self-pay | Admitting: Family Medicine

## 2020-04-22 ENCOUNTER — Telehealth: Payer: Self-pay | Admitting: *Deleted

## 2020-04-22 NOTE — Telephone Encounter (Signed)
Left message for patient to notify them that it is time to schedule annual low dose lung cancer screening CT scan. Instructed patient to call back to verify information prior to the scan being scheduled.  

## 2020-04-23 ENCOUNTER — Telehealth: Payer: BC Managed Care – PPO

## 2020-04-24 ENCOUNTER — Telehealth: Payer: BC Managed Care – PPO

## 2020-04-25 ENCOUNTER — Encounter: Payer: Self-pay | Admitting: Family Medicine

## 2020-04-25 ENCOUNTER — Other Ambulatory Visit: Payer: Self-pay

## 2020-04-25 ENCOUNTER — Telehealth (INDEPENDENT_AMBULATORY_CARE_PROVIDER_SITE_OTHER): Payer: BC Managed Care – PPO | Admitting: Family Medicine

## 2020-04-25 VITALS — Ht 74.0 in | Wt 230.0 lb

## 2020-04-25 DIAGNOSIS — E559 Vitamin D deficiency, unspecified: Secondary | ICD-10-CM | POA: Diagnosis not present

## 2020-04-25 DIAGNOSIS — Z658 Other specified problems related to psychosocial circumstances: Secondary | ICD-10-CM

## 2020-04-25 DIAGNOSIS — D539 Nutritional anemia, unspecified: Secondary | ICD-10-CM

## 2020-04-25 DIAGNOSIS — F1011 Alcohol abuse, in remission: Secondary | ICD-10-CM | POA: Insufficient documentation

## 2020-04-25 DIAGNOSIS — F1020 Alcohol dependence, uncomplicated: Secondary | ICD-10-CM

## 2020-04-25 DIAGNOSIS — E538 Deficiency of other specified B group vitamins: Secondary | ICD-10-CM | POA: Diagnosis not present

## 2020-04-25 DIAGNOSIS — F1010 Alcohol abuse, uncomplicated: Secondary | ICD-10-CM

## 2020-04-25 DIAGNOSIS — R197 Diarrhea, unspecified: Secondary | ICD-10-CM | POA: Insufficient documentation

## 2020-04-25 HISTORY — DX: Alcohol dependence, uncomplicated: F10.20

## 2020-04-25 MED ORDER — HYDROXYZINE HCL 25 MG PO TABS: 12.5000 mg | ORAL_TABLET | Freq: Two times a day (BID) | ORAL | 0 refills | Status: DC | PRN

## 2020-04-25 NOTE — Assessment & Plan Note (Signed)
Reviewed with patient. Will trial hydroxyzine 12.5-25mg  BID PRN anxiety. Avoid benzos at this time in significant alcohol use.

## 2020-04-25 NOTE — Assessment & Plan Note (Addendum)
Doubt infectious. In setting of significant alcohol use concern for pancreatic insufficiency/malabsorption. Discussed this. He is using imodium and pepto bismol to control diarrhea. See below. Will come in Monday for OV for further evaluation and repeat labwork.

## 2020-04-25 NOTE — Assessment & Plan Note (Addendum)
Alcohol overuse and dependence.  Reviewed true alcohol intake which is closer to 12 oz hard liquor/day = 8 standard size drinks/day. Reviewed with patient effect of alcohol on nutrition status and GI organs, encouraged slow titration off alcohol to avoid withdrawals. He is ready to try.

## 2020-04-25 NOTE — Progress Notes (Signed)
Virtual visit started through [employer], a video enabled telemedicine application. Due to national recommendations of social distancing due to COVID-19, a virtual visit is felt to be most appropriate for this patient at this time. Reviewed limitations, risks, security and privacy concerns of performing a virtual visit and the availability of in person appointments. I also reviewed that there may be a patient responsible charge related to this service. The patient agreed to proceed.  Interactive audio and video telecommunications were attempted between myself and Russell Gomez, however failed due to patient having technical difficulties OR patient not having access to video capability.  We continued and completed visit with audio only.  Time on phone: 12:43 - 12:57pm, about 50% of total time with patient  Patient location: home Provider location: [place] at [facility], office Persons participating in this virtual visit: patient, provider   If any vitals were documented, they were collected by patient at home unless specified below.    Ht 6' 2"  (1.88 m)   Wt 230 lb (104.3 kg)   BMI 29.53 kg/m    CC: diarrhea, anxiety Subjective:    Patient ID: Russell Gomez, male    DOB: January 25, 1961, 59 y.o.   MRN: [id number]  HPI: Russell Gomez is a 59 y.o. male presenting on 04/25/2020 for Diarrhea (Pt stated--still having diarrhea, mix water and no appetite) and Anxiety (nerve problem--pt requesting meds)   2d h/o uncontrollable diarrhea with accidents. Frank watery diarrhea. Felt feverish Tmax 100. Watery stool was yellow. Some mucous. Somewhat greasy stools.   No chills, abd pain, nausea/vomiting, blood in stool, black tarry stool.  Currently controlled after drinking 3 bottles of imodium AD and took pepto bismol.   Notes worsening anxiety - asks about medication to help with this - between 3rd shift, stress with separation from wife, no appetite.   Alcohol - averaging 2-3 mixed drinks/day  "medium size" = 12 oz liquor/day.      Relevant past medical, surgical, family and social history reviewed and updated as indicated. Interim medical history since our last visit reviewed. Allergies and medications reviewed and updated. Outpatient Medications Prior to Visit  Medication Sig Dispense Refill  . amLODipine (NORVASC) 5 MG tablet TAKE 1 TABLET BY MOUTH EVERY DAY 90 tablet 2  . aspirin (ASPIRIN EC) 81 MG EC tablet Take 1 tablet (81 mg total) by mouth every Monday, Wednesday, and Friday. Swallow whole. 30 tablet   . Blood Glucose Monitoring Suppl (ACCU-CHEK GUIDE) w/Device KIT See admin instructions.    . Cholecalciferol (VITAMIN D3) 1.25 MG (50000 UT) TABS Take 1 tablet by mouth once a week. 12 tablet 1  . Cyanocobalamin (B-12) 1000 MCG SUBL Place 1 tablet under the tongue daily. 30 tablet 11  . enalapril (VASOTEC) 20 MG tablet Take 1 tablet (20 mg total) by mouth 2 (two) times daily. 90 tablet 3  . folic acid (FOLVITE) 1 MG tablet Take 1 tablet (1 mg total) by mouth daily. 30 tablet 11  . furosemide (LASIX) 20 MG tablet TAKE 1 TABLET BY MOUTH ONCE DAILY AS NEEDED FOR FLUID 30 tablet 0  . glucose blood (ONE TOUCH ULTRA TEST) test strip Use 6 (six) times daily    . insulin lispro (HUMALOG) 100 UNIT/ML injection 5-15 units before each meal based on carb intake and sugar    . Insulin NPH, Human,, Isophane, (HUMULIN N KWIKPEN) 100 UNIT/ML Kiwkpen Inject 9 Units into the skin 2 (two) times daily.    . Magnesium 500 MG  CAPS Take 1 capsule (500 mg total) by mouth daily. 30 capsule 11  . metoprolol tartrate (LOPRESSOR) 50 MG tablet Take 1 tablet (50 mg total) by mouth 2 (two) times daily. 180 tablet 3  . omeprazole (PRILOSEC) 40 MG capsule Take 1 capsule by mouth twice daily 28 capsule 0  . potassium chloride SA (KLOR-CON) 20 MEQ tablet Take 1 tablet (20 mEq total) by mouth daily. 90 tablet 1  . pravastatin (PRAVACHOL) 80 MG tablet TAKE 1 TABLET(80 MG) BY MOUTH DAILY 90 tablet 2  . sildenafil  (VIAGRA) 50 MG tablet Take 1 tablet (50 mg total) by mouth daily as needed for erectile dysfunction. 10 tablet 0  . thiamine 100 MG tablet Take 1 tablet (100 mg total) by mouth daily. 30 tablet 6   No facility-administered medications prior to visit.     Per HPI unless specifically indicated in ROS section below Review of Systems Objective:  Ht 6' 2"  (1.88 m)   Wt 230 lb (104.3 kg)   BMI 29.53 kg/m   Wt Readings from Last 3 Encounters:  04/25/20 230 lb (104.3 kg)  04/16/20 229 lb 2 oz (103.9 kg)  04/04/20 230 lb (104.3 kg)       Physical exam: Gen: alert, NAD, not ill appearing Pulm: speaks in complete sentences without increased work of breathing Psych: normal mood, normal thought content      Results for orders placed or performed in visit on 04/16/20  PSA  Result Value Ref Range   PSA 0.46 0.10 - 4.00 ng/mL  Parathyroid hormone, intact (no Ca)  Result Value Ref Range   PTH 104 (H) 14 - 64 pg/mL  VITAMIN D 25 Hydroxy (Vit-D Deficiency, Fractures)  Result Value Ref Range   VITD 13.83 (L) 30.00 - 100.00 ng/mL  T4, free  Result Value Ref Range   Free T4 0.96 0.60 - 1.60 ng/dL  Magnesium  Result Value Ref Range   Magnesium 1.2 (L) 1.5 - 2.5 mg/dL  Lipid panel  Result Value Ref Range   Cholesterol 207 (H) 0 - 200 mg/dL   Triglycerides 181.0 (H) 0 - 149 mg/dL   HDL 134.80 >39.00 mg/dL   VLDL 36.2 0.0 - 40.0 mg/dL   LDL Cholesterol 36 0 - 99 mg/dL   Total CHOL/HDL Ratio 2    NonHDL 72.08   Comprehensive metabolic panel  Result Value Ref Range   Sodium 139 135 - 145 mEq/L   Potassium 2.9 (L) 3.5 - 5.1 mEq/L   Chloride 92 (L) 96 - 112 mEq/L   CO2 36 (H) 19 - 32 mEq/L   Glucose, Bld 58 (L) 70 - 99 mg/dL   BUN 14 6 - 23 mg/dL   Creatinine, Ser 1.19 0.40 - 1.50 mg/dL   Total Bilirubin 0.9 0.2 - 1.2 mg/dL   Alkaline Phosphatase 144 (H) 39 - 117 U/L   AST 38 (H) 0 - 37 U/L   ALT 21 0 - 53 U/L   Total Protein 6.7 6.0 - 8.3 g/dL   Albumin 3.9 3.5 - 5.2 g/dL   GFR  62.89 >60.00 mL/min   Calcium 8.1 (L) 8.4 - 10.5 mg/dL  CBC with Differential/Platelet  Result Value Ref Range   WBC 10.0 4.0 - 10.5 K/uL   RBC 4.06 (L) 4.22 - 5.81 Mil/uL   Hemoglobin 14.2 13.0 - 17.0 g/dL   HCT 41.7 39 - 52 %   MCV 102.7 (H) 78.0 - 100.0 fl   MCHC 34.1 30.0 - 36.0 g/dL  RDW 15.6 (H) 11.5 - 15.5 %   Platelets 249.0 150 - 400 K/uL   Neutrophils Relative % 64.6 43 - 77 %   Lymphocytes Relative 26.7 12 - 46 %   Monocytes Relative 7.2 3 - 12 %   Eosinophils Relative 1.0 0 - 5 %   Basophils Relative 0.5 0 - 3 %   Neutro Abs 6.5 1.4 - 7.7 K/uL   Lymphs Abs 2.7 0.7 - 4.0 K/uL   Monocytes Absolute 0.7 0 - 1 K/uL   Eosinophils Absolute 0.1 0 - 0 K/uL   Basophils Absolute 0.1 0 - 0 K/uL  TSH  Result Value Ref Range   TSH 0.24 (L) 0.35 - 4.50 uIU/mL  Vitamin B12  Result Value Ref Range   Vitamin B-12 171 (L) 211 - 911 pg/mL  Folate  Result Value Ref Range   Folate 2.6 (L) >5.9 ng/mL  Vitamin B1  Result Value Ref Range   Vitamin B1 (Thiamine) 7 (L) 8 - 30 nmol/L  POCT Urinalysis Dipstick (Automated)  Result Value Ref Range   Color, UA dark yellow    Clarity, UA clear    Glucose, UA Negative Negative   Bilirubin, UA 1+    Ketones, UA +/-    Spec Grav, UA 1.015 1.010 - 1.025   Blood, UA negative    pH, UA 6.0 5.0 - 8.0   Protein, UA Positive (A) Negative   Urobilinogen, UA 1.0 0.2 or 1.0 E.U./dL   Nitrite, UA negative    Leukocytes, UA Negative Negative   Assessment & Plan:   Problem List Items Addressed This Visit    Vitamin D deficiency   Vitamin B12 deficiency   Psychosocial stressors    Reviewed with patient. Will trial hydroxyzine 12.5-25mg BID PRN anxiety. Avoid benzos at this time in significant alcohol use.       Macrocytic anemia   Folate deficiency   Alcohol abuse    Alcohol overuse and dependence.  Reviewed true alcohol intake which is closer to 12 oz hard liquor/day = 8 standard size drinks/day. Reviewed with patient effect of alcohol on  nutrition status and GI organs, encouraged slow titration off alcohol to avoid withdrawals. He is ready to try.       Acute diarrhea - Primary    Doubt infectious. In setting of significant alcohol use concern for pancreatic insufficiency/malabsorption. Discussed this. He is using imodium and pepto bismol to control diarrhea. See below. Will come in Monday for OV for further evaluation and repeat labwork.           Meds ordered this encounter  Medications  . hydrOXYzine (ATARAX/VISTARIL) 25 MG tablet    Sig: Take 0.5-1 tablets (12.5-25 mg total) by mouth 2 (two) times daily as needed for anxiety.    Dispense:  30 tablet    Refill:  0   No orders of the defined types were placed in this encounter.   I discussed the assessment and treatment plan with the patient. The patient was provided an opportunity to ask questions and all were answered. The patient agreed with the plan and demonstrated an understanding of the instructions. The patient was advised to call back or seek an in-person evaluation if the symptoms worsen or if the condition fails to improve as anticipated.  Follow up plan: Return if symptoms worsen or fail to improve.  Ria Bush, MD

## 2020-04-28 ENCOUNTER — Other Ambulatory Visit: Payer: Self-pay | Admitting: Family Medicine

## 2020-04-28 ENCOUNTER — Other Ambulatory Visit: Payer: Self-pay

## 2020-04-28 ENCOUNTER — Encounter: Payer: Self-pay | Admitting: Family Medicine

## 2020-04-28 ENCOUNTER — Telehealth: Payer: Self-pay | Admitting: Radiology

## 2020-04-28 ENCOUNTER — Telehealth: Payer: Self-pay | Admitting: Family Medicine

## 2020-04-28 ENCOUNTER — Ambulatory Visit (INDEPENDENT_AMBULATORY_CARE_PROVIDER_SITE_OTHER): Payer: BC Managed Care – PPO | Admitting: Family Medicine

## 2020-04-28 VITALS — BP 124/76 | HR 87 | Temp 97.8°F | Ht 74.0 in | Wt 230.2 lb

## 2020-04-28 DIAGNOSIS — R197 Diarrhea, unspecified: Secondary | ICD-10-CM

## 2020-04-28 DIAGNOSIS — E519 Thiamine deficiency, unspecified: Secondary | ICD-10-CM

## 2020-04-28 DIAGNOSIS — E559 Vitamin D deficiency, unspecified: Secondary | ICD-10-CM

## 2020-04-28 DIAGNOSIS — R801 Persistent proteinuria, unspecified: Secondary | ICD-10-CM

## 2020-04-28 DIAGNOSIS — E1021 Type 1 diabetes mellitus with diabetic nephropathy: Secondary | ICD-10-CM

## 2020-04-28 DIAGNOSIS — I10 Essential (primary) hypertension: Secondary | ICD-10-CM | POA: Diagnosis not present

## 2020-04-28 DIAGNOSIS — D539 Nutritional anemia, unspecified: Secondary | ICD-10-CM

## 2020-04-28 DIAGNOSIS — F1010 Alcohol abuse, uncomplicated: Secondary | ICD-10-CM

## 2020-04-28 DIAGNOSIS — Z658 Other specified problems related to psychosocial circumstances: Secondary | ICD-10-CM

## 2020-04-28 DIAGNOSIS — E213 Hyperparathyroidism, unspecified: Secondary | ICD-10-CM

## 2020-04-28 DIAGNOSIS — R600 Localized edema: Secondary | ICD-10-CM

## 2020-04-28 DIAGNOSIS — E538 Deficiency of other specified B group vitamins: Secondary | ICD-10-CM | POA: Diagnosis not present

## 2020-04-28 DIAGNOSIS — R634 Abnormal weight loss: Secondary | ICD-10-CM

## 2020-04-28 HISTORY — DX: Thiamine deficiency, unspecified: E51.9

## 2020-04-28 LAB — MICROALBUMIN / CREATININE URINE RATIO
Creatinine,U: 178.4 mg/dL
Microalb Creat Ratio: 4.2 mg/g (ref 0.0–30.0)
Microalb, Ur: 7.5 mg/dL — ABNORMAL HIGH (ref 0.0–1.9)

## 2020-04-28 LAB — BASIC METABOLIC PANEL
BUN: 7 mg/dL (ref 6–23)
CO2: 38 mEq/L — ABNORMAL HIGH (ref 19–32)
Calcium: 6.6 mg/dL — ABNORMAL LOW (ref 8.4–10.5)
Chloride: 91 mEq/L — ABNORMAL LOW (ref 96–112)
Creatinine, Ser: 0.75 mg/dL (ref 0.40–1.50)
GFR: 107.12 mL/min (ref >60.00)
Glucose, Bld: 160 mg/dL — ABNORMAL HIGH (ref 70–99)
Potassium: 3.1 mEq/L — ABNORMAL LOW (ref 3.5–5.1)
Sodium: 141 mEq/L (ref 135–145)

## 2020-04-28 LAB — PROTIME-INR
INR: 1 ratio (ref 0.8–1.0)
Prothrombin Time: 11.4 s (ref 9.6–13.1)

## 2020-04-28 LAB — LIPASE: Lipase: 20 U/L (ref 11.0–59.0)

## 2020-04-28 LAB — MAGNESIUM: Magnesium: 0.8 mg/dL — CL (ref 1.5–2.5)

## 2020-04-28 MED ORDER — DIPHENOXYLATE-ATROPINE 2.5-0.025 MG PO TABS
1.0000 | ORAL_TABLET | Freq: Three times a day (TID) | ORAL | 0 refills | Status: DC | PRN
Start: 2020-04-28 — End: 2020-12-10

## 2020-04-28 MED ORDER — CYANOCOBALAMIN 1000 MCG/ML IJ SOLN
1000.0000 ug | Freq: Once | INTRAMUSCULAR | Status: AC
  Administered 2020-04-28: 1000 ug via INTRAMUSCULAR

## 2020-04-28 NOTE — Assessment & Plan Note (Signed)
Hydroxyzine has been helpful but finds sedating - continue, try 1/2 tab at a time.

## 2020-04-28 NOTE — Assessment & Plan Note (Signed)
See above. Check microalb today. This is on enalapril 20mg  bid.

## 2020-04-28 NOTE — Assessment & Plan Note (Signed)
Found to have multiple vitamin deficiencies - rec start replacement, B12 shot today.

## 2020-04-28 NOTE — Telephone Encounter (Signed)
Tried to reach patient - VM not set up.  Please keep trying - Very abnormal labs - likely diarrhea contributing - recommend ER evaluation to replace electrolytes.

## 2020-04-28 NOTE — Assessment & Plan Note (Signed)
Noted today - with low calcium ?secondary hyperPTH due to renal disease.

## 2020-04-28 NOTE — Assessment & Plan Note (Signed)
Update microalb today. Pending complete abd [place].

## 2020-04-28 NOTE — Assessment & Plan Note (Signed)
rec start 1000-2000 IU daily.

## 2020-04-28 NOTE — Telephone Encounter (Signed)
Attempted to contact pt.  No answer.  Vm not set up.  Need to relay Dr. G's message.  

## 2020-04-28 NOTE — Progress Notes (Signed)
This visit was conducted in person.  BP 124/76 (BP Location: Left Arm, Patient Position: Sitting, Cuff Size: Large)   Pulse 87   Temp 97.8 F (36.6 C) (Temporal)   Ht _0  (1.88 m)   Wt 230 lb 3 oz (104.4 kg)   SpO2 95%   BMI 29.55 kg/m   BP Readings from Last 3 Encounters:  04/28/20 124/76  04/16/20 (!) 144/80  04/04/20 (!) 141/82    CC: f/u abnormal labs Subjective:    Patient ID: Russell Gomez, male    DOB: 10-26-1961, 59 y.o.   MRN: [id number]  HPI: Russell Gomez is a 59 y.o. male presenting on 04/28/2020 for Results (Here for f/u of abnormal labs. )   See prior note and recent lab results for details.   Recent diarrheal illness in setting of significant alcohol use - concern for pancreatic insufficiency/malabsorption. Currently managing with pepto bismol/imodium. Diarrhea progressing to soft stool.   Anxiety - recently started hydroxyzine 12.5-25mg bid prn. Finds this is helpful but overly sedation.   Alcohol use - 12 oz hard liquor/day - working on slowly tapering down.      Relevant past medical, surgical, family and social history reviewed and updated as indicated. Interim medical history since our last visit reviewed. Allergies and medications reviewed and updated. Outpatient Medications Prior to Visit  Medication Sig Dispense Refill  . amLODipine (NORVASC) 5 MG tablet TAKE 1 TABLET BY MOUTH EVERY DAY 90 tablet 2  . aspirin (ASPIRIN EC) 81 MG EC tablet Take 1 tablet (81 mg total) by mouth every Monday, Wednesday, and Friday. Swallow whole. 30 tablet   . Blood Glucose Monitoring Suppl (ACCU-CHEK GUIDE) w/Device KIT See admin instructions.    . Cholecalciferol (VITAMIN D3) 1.25 MG (50000 UT) TABS Take 1 tablet by mouth once a week. 12 tablet 1  . Cyanocobalamin (B-12) 1000 MCG SUBL Place 1 tablet under the tongue daily. 30 tablet 11  . enalapril (VASOTEC) 20 MG tablet Take 1 tablet (20 mg total) by mouth 2 (two) times daily. 90 tablet 3  . folic acid (FOLVITE)  1 MG tablet Take 1 tablet (1 mg total) by mouth daily. 30 tablet 11  . furosemide (LASIX) 20 MG tablet TAKE 1 TABLET BY MOUTH ONCE DAILY AS NEEDED FOR FLUID 30 tablet 0  . glucose blood (ONE TOUCH ULTRA TEST) test strip Use 6 (six) times daily    . hydrOXYzine (ATARAX/VISTARIL) 25 MG tablet Take 0.5-1 tablets (12.5-25 mg total) by mouth 2 (two) times daily as needed for anxiety. 30 tablet 0  . insulin lispro (HUMALOG) 100 UNIT/ML injection 5-15 units before each meal based on carb intake and sugar    . Insulin NPH, Human,, Isophane, (HUMULIN N KWIKPEN) 100 UNIT/ML Kiwkpen Inject 9 Units into the skin 2 (two) times daily.    . Magnesium 500 MG CAPS Take 1 capsule (500 mg total) by mouth daily. 30 capsule 11  . metoprolol tartrate (LOPRESSOR) 50 MG tablet Take 1 tablet (50 mg total) by mouth 2 (two) times daily. 180 tablet 3  . omeprazole (PRILOSEC) 40 MG capsule Take 1 capsule by mouth twice daily 28 capsule 0  . potassium chloride SA (KLOR-CON) 20 MEQ tablet Take 1 tablet (20 mEq total) by mouth daily. 90 tablet 1  . pravastatin (PRAVACHOL) 80 MG tablet TAKE 1 TABLET(80 MG) BY MOUTH DAILY 90 tablet 2  . sildenafil (VIAGRA) 50 MG tablet Take 1 tablet (50 mg total) by mouth daily as needed  for erectile dysfunction. 10 tablet 0  . thiamine 100 MG tablet Take 1 tablet (100 mg total) by mouth daily. 30 tablet 6   No facility-administered medications prior to visit.     Per HPI unless specifically indicated in ROS section below Review of Systems Objective:  BP 124/76 (BP Location: Left Arm, Patient Position: Sitting, Cuff Size: Large)   Pulse 87   Temp 97.8 F (36.6 C) (Temporal)   Ht _0  (1.88 m)   Wt 230 lb 3 oz (104.4 kg)   SpO2 95%   BMI 29.55 kg/m   Wt Readings from Last 3 Encounters:  04/28/20 230 lb 3 oz (104.4 kg)  04/25/20 230 lb (104.3 kg)  04/16/20 229 lb 2 oz (103.9 kg)      Physical Exam Vitals and nursing note reviewed.  Constitutional:      Appearance: Normal  appearance. He is not ill-appearing.  Eyes:     Extraocular Movements: Extraocular movements intact.     Pupils: Pupils are equal, round, and reactive to light.  Cardiovascular:     Rate and Rhythm: Normal rate and regular rhythm.     Pulses: Normal pulses.     Heart sounds: Normal heart sounds. No murmur heard.   Pulmonary:     Effort: Pulmonary effort is normal. No respiratory distress.     Breath sounds: Normal breath sounds. No wheezing, rhonchi or rales.  Abdominal:     General: Abdomen is flat. Bowel sounds are normal. There is no distension.     Palpations: Abdomen is soft. There is no mass.     Tenderness: There is no abdominal tenderness. There is no guarding or rebound.     Hernia: No hernia is present.  Musculoskeletal:     Right lower leg: Edema (tr) present.     Left lower leg: Edema (tr) present.  Skin:    Findings: No rash.     Comments: Bruising to forearms  Neurological:     Mental Status: He is alert.  Psychiatric:        Mood and Affect: Mood normal.        Behavior: Behavior normal.       Results for orders placed or performed in visit on 04/16/20  PSA  Result Value Ref Range   PSA 0.46 0.10 - 4.00 ng/mL  Parathyroid hormone, intact (no Ca)  Result Value Ref Range   PTH 104 (H) 14 - 64 pg/mL  VITAMIN D 25 Hydroxy (Vit-D Deficiency, Fractures)  Result Value Ref Range   VITD 13.83 (L) 30.00 - 100.00 ng/mL  T4, free  Result Value Ref Range   Free T4 0.96 0.60 - 1.60 ng/dL  Magnesium  Result Value Ref Range   Magnesium 1.2 (L) 1.5 - 2.5 mg/dL  Lipid panel  Result Value Ref Range   Cholesterol 207 (H) 0 - 200 mg/dL   Triglycerides 181.0 (H) 0 - 149 mg/dL   HDL 134.80 >39.00 mg/dL   VLDL 36.2 0.0 - 40.0 mg/dL   LDL Cholesterol 36 0 - 99 mg/dL   Total CHOL/HDL Ratio 2    NonHDL 72.08   Comprehensive metabolic panel  Result Value Ref Range   Sodium 139 135 - 145 mEq/L   Potassium 2.9 (L) 3.5 - 5.1 mEq/L   Chloride 92 (L) 96 - 112 mEq/L   CO2 36  (H) 19 - 32 mEq/L   Glucose, Bld 58 (L) 70 - 99 mg/dL   BUN 14 6 - 23 mg/dL  Creatinine, Ser 1.19 0.40 - 1.50 mg/dL   Total Bilirubin 0.9 0.2 - 1.2 mg/dL   Alkaline Phosphatase 144 (H) 39 - 117 U/L   AST 38 (H) 0 - 37 U/L   ALT 21 0 - 53 U/L   Total Protein 6.7 6.0 - 8.3 g/dL   Albumin 3.9 3.5 - 5.2 g/dL   GFR 62.89 >60.00 mL/min   Calcium 8.1 (L) 8.4 - 10.5 mg/dL  CBC with Differential/Platelet  Result Value Ref Range   WBC 10.0 4.0 - 10.5 K/uL   RBC 4.06 (L) 4.22 - 5.81 Mil/uL   Hemoglobin 14.2 13.0 - 17.0 g/dL   HCT 41.7 39 - 52 %   MCV 102.7 (H) 78.0 - 100.0 fl   MCHC 34.1 30.0 - 36.0 g/dL   RDW 15.6 (H) 11.5 - 15.5 %   Platelets 249.0 150 - 400 K/uL   Neutrophils Relative % 64.6 43 - 77 %   Lymphocytes Relative 26.7 12 - 46 %   Monocytes Relative 7.2 3 - 12 %   Eosinophils Relative 1.0 0 - 5 %   Basophils Relative 0.5 0 - 3 %   Neutro Abs 6.5 1.4 - 7.7 K/uL   Lymphs Abs 2.7 0.7 - 4.0 K/uL   Monocytes Absolute 0.7 0 - 1 K/uL   Eosinophils Absolute 0.1 0 - 0 K/uL   Basophils Absolute 0.1 0 - 0 K/uL  TSH  Result Value Ref Range   TSH 0.24 (L) 0.35 - 4.50 uIU/mL  Vitamin B12  Result Value Ref Range   Vitamin B-12 171 (L) 211 - 911 pg/mL  Folate  Result Value Ref Range   Folate 2.6 (L) >5.9 ng/mL  Vitamin B1  Result Value Ref Range   Vitamin B1 (Thiamine) 7 (L) 8 - 30 nmol/L  POCT Urinalysis Dipstick (Automated)  Result Value Ref Range   Color, UA dark yellow    Clarity, UA clear    Glucose, UA Negative Negative   Bilirubin, UA 1+    Ketones, UA +/-    Spec Grav, UA 1.015 1.010 - 1.025   Blood, UA negative    pH, UA 6.0 5.0 - 8.0   Protein, UA Positive (A) Negative   Urobilinogen, UA 1.0 0.2 or 1.0 E.U./dL   Nitrite, UA negative    Leukocytes, UA Negative Negative   Assessment & Plan:  This visit occurred during the SARS-CoV-2 public health emergency.  Safety protocols were in place, including screening questions prior to the visit, additional usage of  staff PPE, and extensive cleaning of exam room while observing appropriate contact time as indicated for disinfecting solutions.   Problem List Items Addressed This Visit    Vitamin D deficiency    rec start 1000-2000 IU daily.       Vitamin B12 deficiency    b12 shot today then start 1048mg daily.      Type 1 diabetes mellitus with diabetic nephropathy ([place]    See above. Check microalb today. This is on enalapril 225mbid.       Thiamine deficiency    rec start 50-1008maily      Psychosocial stressors    Hydroxyzine has been helpful but finds sedating - continue, try 1/2 tab at a time.       Proteinuria    Update microalb today. Pending complete abd [place]     Pedal edema    Ongoing. Anticipate related to alcohol intake, should improve with decreased use. abd [place]  pending to eval liver and kidneys.       Macrocytic anemia    Found to have multiple vitamin deficiencies - rec start replacement, B12 shot today.       Hyperparathyroidism ([place])    Noted today - with low calcium ?secondary hyperPTH due to renal disease.       Folate deficiency    rec start folate 59m daily.      Essential hypertension    Improved readings noted today.       Alcohol abuse - Primary    Discussed with patient, reviewed health risks of alcohol abuse. He is starting to cut down. Start vitamin deficiency replacement.  Awaiting further evaluation for alcoholic liver disease with abd [place]      Relevant Orders   Basic metabolic panel   Magnesium   Microalbumin / creatinine urine ratio   Lipase   Protime-INR   Acute diarrhea    This is improving with less alcohol intake. Continue to monitor. If ongoing, low threshold to evaluate for fecal fat/malabsorption/pancreas insufficiency.          Meds ordered this encounter  Medications  . cyanocobalamin ((VITAMIN B-12)) injection 1,000 mcg   Orders Placed This Encounter  Procedures  . Basic metabolic panel  . Magnesium  .  Microalbumin / creatinine urine ratio  . Lipase  . Protime-INR    Patient instructions Labs today.  Urine check today for protein levels.  B12 shot today I recommend starting vitamins B12 10034m, B1 5-105mfolate 1mg72md vitamin D 1000-2000 units daily over the counter.  Continue working on slowly decreasing alcohol.   Follow up plan: No follow-ups on file.  JaviRia Bush

## 2020-04-28 NOTE — Assessment & Plan Note (Signed)
rec start folate 1mg  daily.

## 2020-04-28 NOTE — Patient Instructions (Addendum)
Labs today.  Urine check today for protein levels.  B12 shot today I recommend starting vitamins B12 1051mcg, B1 5-10mg , folate 1mg  and vitamin D 1000-2000 units daily over the counter.  Continue working on slowly decreasing alcohol.   Alcohol Abuse and Dependence Information, Adult Alcohol is a widely available drug. People drink alcohol in different amounts. People who drink alcohol very often and in large amounts often have problems during and after drinking. They may develop what is called an alcohol use disorder. There are two main types of alcohol use disorders:  Alcohol abuse. This is when you use alcohol too much or too often. You may use alcohol to make yourself feel happy or to reduce stress. You may have a hard time setting a limit on the amount you drink.  Alcohol dependence. This is when you use alcohol consistently for a period of time, and your body changes as a result. This can make it hard to stop drinking because you may start to feel sick or feel different when you do not use alcohol. These symptoms are known as withdrawal. How can alcohol abuse and dependence affect me? Alcohol abuse and dependence can have a negative effect on your life. Drinking too much can lead to addiction. You may feel like you need alcohol to function normally. You may drink alcohol before work in the morning, during the day, or as soon as you get home from work in the evening. These actions can result in:  Poor work performance.  Job loss.  Financial problems.  Car crashes or criminal charges from driving after drinking alcohol.  Problems in your relationships with friends and family.  Losing the trust and respect of coworkers, friends, and family. Drinking heavily over a long period of time can permanently damage your body and brain, and can cause lifelong health issues, such as:  Damage to your liver or pancreas.  Heart problems, high blood pressure, or stroke.  Certain  cancers.  Decreased ability to fight infections.  Brain or nerve damage.  Depression.  Early (premature) death. If you are careless or you crave alcohol, it is easy to drink more than your body can handle (overdose). Alcohol overdose is a serious situation that requires hospitalization. It may lead to permanent injuries or death. What can increase my risk?  Having a family history of alcohol abuse.  Having depression or other mental health conditions.  Beginning to drink at an early age.  Binge drinking often.  Experiencing trauma, stress, and an unstable home life during childhood.  Spending time with people who drink often. What actions can I take to prevent or manage alcohol abuse and dependence?  Do not drink alcohol if: ? Your health care provider tells you not to drink. ? You are pregnant, may be pregnant, or are planning to become pregnant.  If you drink alcohol: ? Limit how much you use to:  0-1 drink a day for women.  0-2 drinks a day for men. ? Be aware of how much alcohol is in your drink. In the [place] one drink equals one 12 oz bottle of beer (355 mL), one 5 oz glass of wine (148 mL), or one 1 oz glass of hard liquor (44 mL).  Stop drinking if you have been drinking too much. This can be very hard to do if you are used to abusing alcohol. If you begin to have withdrawal symptoms, talk with your health care provider or a person that you trust. These symptoms may  include anxiety, shaky hands, headache, nausea, sweating, or not being able to sleep.  Choose to drink nonalcoholic beverages in social gatherings and places where there may be alcohol. Activity  Spend more time on activities that you enjoy that do not involve alcohol, like hobbies or exercise.  Find healthy ways to cope with stress, such as exercise, meditation, or spending time with people you care about. General information  Talk to your family, coworkers, and friends about supporting you in your  efforts to stop drinking. If they drink, ask them not to drink around you. Spend more time with people who do not drink alcohol.  If you think that you have an alcohol dependency problem: ? Tell friends or family about your concerns. ? Talk with your health care provider or another health professional about where to get help. ? Work with a Transport planner and a Regulatory affairs officer. ? Consider joining a support group for people who struggle with alcohol abuse and dependence. Where to find support   Your health care provider.  SMART Recovery: www.smartrecovery.org Therapy and support groups  Local treatment centers or chemical dependency counselors.  Local AA groups in your community: NicTax.com.pt Where to find more information  Centers for Disease Control and Prevention: http://www.wolf.info/  National Institute on Alcohol Abuse and Alcoholism: http://www.bradshaw.com/  Alcoholics Anonymous (AA): NicTax.com.pt Contact a health care provider if:  You drank more or for longer than you intended on more than one occasion.  You tried to stop drinking or to cut back on how much you drink, but you were not able to.  You often drink to the point of vomiting or passing out.  You want to drink so badly that you cannot think about anything else.  You have problems in your life due to drinking, but you continue to drink.  You keep drinking even though you feel anxious, depressed, or have experienced memory loss.  You have stopped doing the things you used to enjoy in order to drink.  You have to drink more than you used to in order to get the effect you want.  You experience anxiety, sweating, nausea, shakiness, and trouble sleeping when you try to stop drinking. Get help right away if:  You have thoughts about hurting yourself or others.  You have serious withdrawal symptoms, including: ? Confusion. ? Racing heart. ? High blood pressure. ? Fever. If you ever feel like you may hurt yourself or  others, or have thoughts about taking your own life, get help right away. You can go to your nearest emergency department or call:  Your local emergency services (911 in the [place]  A suicide crisis helpline, such as the [place] at (671) 731-5325. This is open 24 hours a day. Summary  Alcohol abuse and dependence can have a negative effect on your life. Drinking too much or too often can lead to addiction.  If you drink alcohol, limit how much you use.  If you are having trouble keeping your drinking under control, find ways to change your behavior. Hobbies, calming activities, exercise, or support groups can help.  If you feel you need help with changing your drinking habits, talk with your health care provider, a good friend, or a therapist, or go to an [place] group. This information is not intended to replace advice given to you by your health care provider. Make sure you discuss any questions you have with your health care provider. Document Revised: 02/13/2019 Document Reviewed: 01/02/2019 Elsevier Patient Education  2020 Elsevier Inc.  

## 2020-04-28 NOTE — Assessment & Plan Note (Signed)
b12 shot today then start 1045mcg daily.

## 2020-04-28 NOTE — Telephone Encounter (Signed)
[facility] called a critical result, MG- 0.8, CALCIUM - 6.6. (calcium is critical at 6.0) . Results given to Dr Danise Mina

## 2020-04-28 NOTE — Telephone Encounter (Signed)
Plz notify - I'd like him to pass by lab for stool tests (ordered) and he may try lomotil anti diarrheal medication sent in for him. However I want him to not take med until we get lab results back to ensure electrolytes are improving.

## 2020-04-28 NOTE — Assessment & Plan Note (Signed)
This is improving with less alcohol intake. Continue to monitor. If ongoing, low threshold to evaluate for fecal fat/malabsorption/pancreas insufficiency.

## 2020-04-28 NOTE — Assessment & Plan Note (Signed)
Improved readings noted today.

## 2020-04-28 NOTE — Assessment & Plan Note (Signed)
Ongoing. Anticipate related to alcohol intake, should improve with decreased use. abd [place] pending to eval liver and kidneys.

## 2020-04-28 NOTE — Telephone Encounter (Signed)
Patient called today He stated that the uncontrollable diarrhea has returned. Patient is requesting something to be called into his pharmacy. Patient asked that it be a strong medication because the diarrhea is uncontrollable and needs something to make it stop

## 2020-04-28 NOTE — Assessment & Plan Note (Signed)
rec start 50-100mg  daily

## 2020-04-28 NOTE — Assessment & Plan Note (Addendum)
Discussed with patient, reviewed health risks of alcohol abuse. He is starting to cut down. Start vitamin deficiency replacement.  Awaiting further evaluation for alcoholic liver disease with abd [place]

## 2020-04-29 ENCOUNTER — Telehealth: Payer: Self-pay

## 2020-04-29 NOTE — Telephone Encounter (Signed)
Patient aware of recommendations.  

## 2020-04-29 NOTE — Telephone Encounter (Signed)
-----   Message from Ria Bush, MD sent at 04/29/2020  8:11 AM EDT ----- Plz notify pancreas blood test returned ok, kidneys improved.  Electrolytes remain very abnormal including low calcium and magnesium - how is he feeling today, how is diarrhea? If ongoing diarrhea, malaise, do recommend ER eval for electrolyte replacement.

## 2020-04-29 NOTE — Telephone Encounter (Signed)
No diarrhea today.  Advised patient of results and recommendations.

## 2020-04-29 NOTE — Telephone Encounter (Signed)
Patient aware of results and recommendations. °

## 2020-05-01 ENCOUNTER — Other Ambulatory Visit: Payer: Self-pay | Admitting: Family Medicine

## 2020-05-01 ENCOUNTER — Ambulatory Visit: Payer: BC Managed Care – PPO

## 2020-05-01 DIAGNOSIS — N289 Disorder of kidney and ureter, unspecified: Secondary | ICD-10-CM

## 2020-05-01 NOTE — Telephone Encounter (Signed)
Spoke with patient - he is taking vitamin and mineral supplements. rec add calcium supplement as well. Ongoing loose stools but overall improving diarrhea  Will come tomorrow for AM labs.

## 2020-05-02 ENCOUNTER — Other Ambulatory Visit: Payer: BC Managed Care – PPO

## 2020-05-06 ENCOUNTER — Ambulatory Visit: Payer: BC Managed Care – PPO

## 2020-05-09 ENCOUNTER — Telehealth: Payer: Self-pay | Admitting: *Deleted

## 2020-05-09 ENCOUNTER — Other Ambulatory Visit (INDEPENDENT_AMBULATORY_CARE_PROVIDER_SITE_OTHER): Payer: BC Managed Care – PPO

## 2020-05-09 ENCOUNTER — Other Ambulatory Visit: Payer: Self-pay

## 2020-05-09 DIAGNOSIS — N289 Disorder of kidney and ureter, unspecified: Secondary | ICD-10-CM

## 2020-05-09 DIAGNOSIS — I10 Essential (primary) hypertension: Secondary | ICD-10-CM

## 2020-05-09 LAB — RENAL FUNCTION PANEL
Albumin: 3.1 g/dL — ABNORMAL LOW (ref 3.5–5.2)
BUN: 11 mg/dL (ref 6–23)
CO2: 31 mEq/L (ref 19–32)
Calcium: 7.1 mg/dL — ABNORMAL LOW (ref 8.4–10.5)
Chloride: 99 mEq/L (ref 96–112)
Creatinine, Ser: 1.13 mg/dL (ref 0.40–1.50)
GFR: 66.74 mL/min (ref >60.00)
Glucose, Bld: 228 mg/dL — ABNORMAL HIGH (ref 70–99)
Phosphorus: 3.5 mg/dL (ref 2.3–4.6)
Potassium: 3.1 mEq/L — ABNORMAL LOW (ref 3.5–5.1)
Sodium: 141 mEq/L (ref 135–145)

## 2020-05-09 LAB — MAGNESIUM: Magnesium: 0.9 mg/dL — CL (ref 1.5–2.5)

## 2020-05-09 NOTE — Telephone Encounter (Signed)
Received call from [facility] with critical Magnesium of 0.9.  Dr. Danise Mina notified at 3:05 pm.

## 2020-05-09 NOTE — Telephone Encounter (Signed)
See result note. I tried calling earlier in the day and again tonight - unable to reach patient.

## 2020-05-13 ENCOUNTER — Other Ambulatory Visit: Payer: Self-pay

## 2020-05-13 MED ORDER — SM CORAL CALCIUM 1000 (390 CA) MG PO TABS: 1.0000 | ORAL_TABLET | Freq: Every day | ORAL | Status: DC

## 2020-05-13 NOTE — Telephone Encounter (Signed)
Patient contacted the office and states that he needs a refill on Omeprazole, last refilled 04/04/20 for #28 with 0 refills. Patient is wondering if he can have a 90 day supply of this medication, or if Dr. Darnell Level advised that he cut back on this medication? Dr. Darnell Level, I do not see anything documented about cutting back on this rx - please advise.

## 2020-05-13 NOTE — Addendum Note (Signed)
Addended by: Ria Bush on: 05/13/2020 04:35 PM   Modules accepted: Orders

## 2020-05-13 NOTE — Telephone Encounter (Addendum)
Called patient.  He has been taking lasix 20mg  daily to BID. Notes worsening edema.  He is taking his magnesium 500mg  QD, potassium 9mEq QD, and calcium daily.  Will return stool tests tomorrow.  I asked him to reschedule abd [place] - has been cancelled twice in the past 2 wks.  Reviewed symptoms needing urgent evaluation.  We may add spironolactone - but want to wait until diarrhea better. Please schedule lab visit for early next week for f/u electrolyte abnormalities.

## 2020-05-13 NOTE — Telephone Encounter (Signed)
Attempted to call pt.  No answer.  Vm box has not been set up.  Needs to schedule lab visit for early next week.

## 2020-05-14 ENCOUNTER — Other Ambulatory Visit: Payer: BC Managed Care – PPO

## 2020-05-14 DIAGNOSIS — R197 Diarrhea, unspecified: Secondary | ICD-10-CM | POA: Diagnosis not present

## 2020-05-14 NOTE — Addendum Note (Signed)
Addended by: Ellamae Sia on: 05/14/2020 04:14 PM   Modules accepted: Orders

## 2020-05-14 NOTE — Telephone Encounter (Signed)
Spoke with pt to schedule lab visit.  Says he will need to call back later this week to schedule.

## 2020-05-15 LAB — C. DIFFICILE GDH AND TOXIN A/B
GDH ANTIGEN: NOT DETECTED
MICRO NUMBER:: 10675703
SPECIMEN QUALITY:: ADEQUATE
TOXIN A AND B: NOT DETECTED

## 2020-05-15 MED ORDER — OMEPRAZOLE 40 MG PO CPDR: 40.0000 mg | DELAYED_RELEASE_CAPSULE | Freq: Two times a day (BID) | ORAL | 1 refills | Status: DC

## 2020-05-15 NOTE — Telephone Encounter (Signed)
Spoke with pt relaying Dr. Synthia Innocent message.  Pt verbalizes understanding.  States he has tried once daily but has issues at night when he gets up so he takes the 2nd pill.

## 2020-05-15 NOTE — Telephone Encounter (Signed)
I've sent in enough for BID dosing but would like him to try once daily omeprazole.

## 2020-05-16 ENCOUNTER — Telehealth: Payer: Self-pay

## 2020-05-16 NOTE — Telephone Encounter (Signed)
Attempted to contact pt.  No answer.  Vm box not set up.  Need to relay lab results.  Per Dr. Darnell Level: Your stool test for one type of bacteria returned reassuringly negative. Awaiting pancreas stool test.

## 2020-05-18 LAB — FECAL FAT, QUALITATIVE: FECAL FAT, QUALITATIVE: NORMAL

## 2020-05-18 NOTE — Addendum Note (Signed)
Addended by: Ria Bush on: 05/18/2020 08:55 PM   Modules accepted: Orders

## 2020-05-19 ENCOUNTER — Telehealth: Payer: Self-pay

## 2020-05-19 NOTE — Telephone Encounter (Signed)
While on the phn relaying pt's lab results, he mentioned that furosemide is not working for the swelling.  Says his ankles are bigger than ever.  Pt is asking if there is something else he can try.  Plz advise.

## 2020-05-19 NOTE — Telephone Encounter (Signed)
Pt.notified

## 2020-05-19 NOTE — Telephone Encounter (Signed)
Attempted to contact pt.  No answer.  Vm box not set up.  Need to relay Dr. Synthia Innocent message and schedule OV ASAP.

## 2020-05-19 NOTE — Telephone Encounter (Signed)
Let's hold amlodipine at this time as it could contribute to leg swelling. plz schedule OV asap to review options - we can get labs at that time.

## 2020-05-20 NOTE — Telephone Encounter (Signed)
Spoke with pt relaying Dr. Synthia Innocent message.  Verbalizes understanding and scheduled OV on 05/22/20 at 9:00.

## 2020-05-22 ENCOUNTER — Encounter: Payer: Self-pay | Admitting: Family Medicine

## 2020-05-22 ENCOUNTER — Other Ambulatory Visit: Payer: Self-pay

## 2020-05-22 ENCOUNTER — Ambulatory Visit (INDEPENDENT_AMBULATORY_CARE_PROVIDER_SITE_OTHER): Payer: BC Managed Care – PPO | Admitting: Family Medicine

## 2020-05-22 ENCOUNTER — Ambulatory Visit: Admission: RE | Admit: 2020-05-22 | Payer: BC Managed Care – PPO | Source: Ambulatory Visit

## 2020-05-22 VITALS — BP 140/78 | HR 92 | Temp 97.8°F | Ht 74.0 in | Wt 243.5 lb

## 2020-05-22 DIAGNOSIS — R197 Diarrhea, unspecified: Secondary | ICD-10-CM

## 2020-05-22 DIAGNOSIS — R600 Localized edema: Secondary | ICD-10-CM

## 2020-05-22 DIAGNOSIS — E519 Thiamine deficiency, unspecified: Secondary | ICD-10-CM

## 2020-05-22 DIAGNOSIS — R7401 Elevation of levels of liver transaminase levels: Secondary | ICD-10-CM | POA: Insufficient documentation

## 2020-05-22 DIAGNOSIS — E1069 Type 1 diabetes mellitus with other specified complication: Secondary | ICD-10-CM

## 2020-05-22 DIAGNOSIS — E538 Deficiency of other specified B group vitamins: Secondary | ICD-10-CM

## 2020-05-22 DIAGNOSIS — E559 Vitamin D deficiency, unspecified: Secondary | ICD-10-CM

## 2020-05-22 DIAGNOSIS — I10 Essential (primary) hypertension: Secondary | ICD-10-CM

## 2020-05-22 DIAGNOSIS — F1010 Alcohol abuse, uncomplicated: Secondary | ICD-10-CM

## 2020-05-22 LAB — COMPREHENSIVE METABOLIC PANEL
ALT: 34 U/L (ref 0–53)
AST: 49 U/L — ABNORMAL HIGH (ref 0–37)
Albumin: 3.6 g/dL (ref 3.5–5.2)
Alkaline Phosphatase: 127 U/L — ABNORMAL HIGH (ref 39–117)
BUN: 14 mg/dL (ref 6–23)
CO2: 32 mEq/L (ref 19–32)
Calcium: 8.3 mg/dL — ABNORMAL LOW (ref 8.4–10.5)
Chloride: 102 mEq/L (ref 96–112)
Creatinine, Ser: 0.85 mg/dL (ref 0.40–1.50)
GFR: 92.69 mL/min (ref >60.00)
Glucose, Bld: 125 mg/dL — ABNORMAL HIGH (ref 70–99)
Potassium: 4.1 mEq/L (ref 3.5–5.1)
Sodium: 142 mEq/L (ref 135–145)
Total Bilirubin: 0.9 mg/dL (ref 0.2–1.2)
Total Protein: 6.2 g/dL (ref 6.0–8.3)

## 2020-05-22 LAB — CBC WITH DIFFERENTIAL/PLATELET
Basophils Absolute: 0.1 10*3/uL (ref 0.0–0.1)
Basophils Relative: 1 % (ref 0.0–3.0)
Eosinophils Absolute: 0.3 10*3/uL (ref 0.0–0.7)
Eosinophils Relative: 4.5 % (ref 0.0–5.0)
HCT: 38.2 % — ABNORMAL LOW (ref 39.0–52.0)
Hemoglobin: 12.9 g/dL — ABNORMAL LOW (ref 13.0–17.0)
Lymphocytes Relative: 26.5 % (ref 12.0–46.0)
Lymphs Abs: 2 10*3/uL (ref 0.7–4.0)
MCHC: 33.7 g/dL (ref 30.0–36.0)
MCV: 106 fl — ABNORMAL HIGH (ref 78.0–100.0)
Monocytes Absolute: 0.6 10*3/uL (ref 0.1–1.0)
Monocytes Relative: 8 % (ref 3.0–12.0)
Neutro Abs: 4.5 10*3/uL (ref 1.4–7.7)
Neutrophils Relative %: 60 % (ref 43.0–77.0)
Platelets: 276 10*3/uL (ref 150.0–400.0)
RBC: 3.61 Mil/uL — ABNORMAL LOW (ref 4.22–5.81)
RDW: 16.6 % — ABNORMAL HIGH (ref 11.5–15.5)
WBC: 7.4 10*3/uL (ref 4.0–10.5)

## 2020-05-22 LAB — VITAMIN B12: Vitamin B-12: 291 pg/mL (ref 211–911)

## 2020-05-22 LAB — FOLATE: Folate: 23.5 ng/mL (ref >5.9)

## 2020-05-22 LAB — VITAMIN D 25 HYDROXY (VIT D DEFICIENCY, FRACTURES): VITD: 23.3 ng/mL — ABNORMAL LOW (ref 30.00–100.00)

## 2020-05-22 LAB — TSH: TSH: 0.11 u[IU]/mL — ABNORMAL LOW (ref 0.35–4.50)

## 2020-05-22 MED ORDER — FUROSEMIDE 40 MG PO TABS: 40.0000 mg | ORAL_TABLET | Freq: Every day | ORAL | 3 refills | Status: DC

## 2020-05-22 MED ORDER — CYANOCOBALAMIN 1000 MCG/ML IJ SOLN
1000.0000 ug | Freq: Once | INTRAMUSCULAR | Status: AC
  Administered 2020-05-22: 1000 ug via INTRAMUSCULAR

## 2020-05-22 NOTE — Assessment & Plan Note (Addendum)
B12 shot today while in office.  He has been regular with vitamins and supplements.  Check levels (pre-shot), continue oral b12 replacement.

## 2020-05-22 NOTE — Assessment & Plan Note (Signed)
Seems he missed abdominal ultrasound this morning - will call to reschedule

## 2020-05-22 NOTE — Patient Instructions (Signed)
Labs today. Then B12 shot today  Increase furosemide to 40mg  daily, let me know effect on urination. Call imaging center about abdominal ultrasound - looks like it was scheduled for today.  Start using compression stockings.  We will be in touch with results.

## 2020-05-22 NOTE — Assessment & Plan Note (Signed)
Reviewed dangers of insulin without meal.

## 2020-05-22 NOTE — Assessment & Plan Note (Signed)
BP stable off amlodipine - will stay off, continue enalapril and metoprolol BID.

## 2020-05-22 NOTE — Assessment & Plan Note (Addendum)
Continue to encourage full cessation. He continues slowly tapering alcohol intake - down to 3 mixed drinks/day (previously 12 oz liquor daily equivalent to 8 standard drinks/day).

## 2020-05-22 NOTE — Progress Notes (Signed)
This visit was conducted in person.  BP 140/78 (BP Location: Left Arm, Patient Position: Sitting, Cuff Size: Large)    Pulse 92    Temp 97.8 F (36.6 C) (Temporal)    Ht 6' 2"  (1.88 m)    Wt 243 lb 8 oz (110.5 kg)    SpO2 97%    BMI 31.26 kg/m    CC: ankle swelling Subjective:    Patient ID: Russell Gomez, male    DOB: 04-01-1961, 59 y.o.   MRN: [id number]  HPI: Russell Gomez is a 59 y.o. male presenting on 05/22/2020 for Joint Swelling (C/o continued bilateral ankle swelling.)   See prior notes for details.  Upcoming abd [place] scheduled later today.   Had hypoglycemic seizure on Sunday - after he fell asleep and forgot to eat. Paramedics evaluated him, did not go to ER.   Diarrhea has significantly improved. Eating better than he was with noted weight gain.  Cutting down by 1 drink a week - down to 3 mixed drinks a day (from 8).   Stopped amlodipine due to possible contribution to pedal edema. BP remaining stable off amlodipine.  Continues enalapril, metoprolol, omeprazole BID and pravastatin QD.  He has been taking potassium 37mq daily.      Relevant past medical, surgical, family and social history reviewed and updated as indicated. Interim medical history since our last visit reviewed. Allergies and medications reviewed and updated. Outpatient Medications Prior to Visit  Medication Sig Dispense Refill   aspirin (ASPIRIN EC) 81 MG EC tablet Take 1 tablet (81 mg total) by mouth every Monday, Wednesday, and Friday. Swallow whole. 30 tablet    Blood Glucose Monitoring Suppl (ACCU-CHEK GUIDE) w/Device KIT See admin instructions.     Cholecalciferol (VITAMIN D3) 1.25 MG (50000 UT) TABS Take 1 tablet by mouth once a week. 12 tablet 1   Coral Calcium (SM CORAL CALCIUM) 1000 (390 Ca) MG TABS Take 1 tablet by mouth daily. 60 tablet    Cyanocobalamin (B-12) 1000 MCG SUBL Place 1 tablet under the tongue daily. 30 tablet 11   diphenoxylate-atropine (LOMOTIL) 2.5-0.025 MG tablet  Take 1 tablet by mouth 3 (three) times daily as needed for diarrhea or loose stools. 30 tablet 0   enalapril (VASOTEC) 20 MG tablet Take 1 tablet (20 mg total) by mouth 2 (two) times daily. 90 tablet 3   folic acid (FOLVITE) 1 MG tablet Take 1 tablet (1 mg total) by mouth daily. 30 tablet 11   glucose blood (ONE TOUCH ULTRA TEST) test strip Use 6 (six) times daily     hydrOXYzine (ATARAX/VISTARIL) 25 MG tablet Take 0.5-1 tablets (12.5-25 mg total) by mouth 2 (two) times daily as needed for anxiety. 30 tablet 0   insulin lispro (HUMALOG) 100 UNIT/ML injection 5-15 units before each meal based on carb intake and sugar     Insulin NPH, Human,, Isophane, (HUMULIN N KWIKPEN) 100 UNIT/ML Kiwkpen Inject 9 Units into the skin 2 (two) times daily.     Magnesium 500 MG CAPS Take 1 capsule (500 mg total) by mouth daily. 30 capsule 11   metoprolol tartrate (LOPRESSOR) 50 MG tablet Take 1 tablet (50 mg total) by mouth 2 (two) times daily. 180 tablet 3   omeprazole (PRILOSEC) 40 MG capsule Take 1 capsule (40 mg total) by mouth 2 (two) times daily. 60 capsule 1   potassium chloride SA (KLOR-CON) 20 MEQ tablet Take 1 tablet (20 mEq total) by mouth daily. 90 tablet 1  pravastatin (PRAVACHOL) 80 MG tablet TAKE 1 TABLET(80 MG) BY MOUTH DAILY 90 tablet 2   sildenafil (VIAGRA) 50 MG tablet Take 1 tablet (50 mg total) by mouth daily as needed for erectile dysfunction. 10 tablet 0   thiamine 100 MG tablet Take 1 tablet (100 mg total) by mouth daily. 30 tablet 6   amLODipine (NORVASC) 5 MG tablet TAKE 1 TABLET BY MOUTH EVERY DAY 90 tablet 2   furosemide (LASIX) 20 MG tablet TAKE 1 TABLET BY MOUTH ONCE DAILY AS NEEDED FOR FLUID 30 tablet 0   No facility-administered medications prior to visit.     Per HPI unless specifically indicated in ROS section below Review of Systems Objective:  BP 140/78 (BP Location: Left Arm, Patient Position: Sitting, Cuff Size: Large)    Pulse 92    Temp 97.8 F (36.6 C)  (Temporal)    Ht 6' 2"  (1.88 m)    Wt 243 lb 8 oz (110.5 kg)    SpO2 97%    BMI 31.26 kg/m   Wt Readings from Last 3 Encounters:  05/22/20 243 lb 8 oz (110.5 kg)  04/28/20 230 lb 3 oz (104.4 kg)  04/25/20 230 lb (104.3 kg)      Physical Exam Vitals and nursing note reviewed.  Constitutional:      Appearance: Normal appearance. He is not ill-appearing.  Cardiovascular:     Rate and Rhythm: Normal rate and regular rhythm.     Pulses: Normal pulses.     Heart sounds: Normal heart sounds. No murmur heard.   Pulmonary:     Effort: Pulmonary effort is normal. No respiratory distress.     Breath sounds: Normal breath sounds. No wheezing, rhonchi or rales.  Abdominal:     General: Abdomen is flat. Bowel sounds are normal. There is no distension.     Palpations: Abdomen is soft. There is no mass.     Tenderness: There is no abdominal tenderness. There is no right CVA tenderness, left CVA tenderness, guarding or rebound.     Hernia: No hernia is present.  Musculoskeletal:        General: Swelling present.     Right lower leg: Edema (2+ pitting) present.     Left lower leg: Edema (2+ pitting) present.     Comments: 2+ DP bilaterally  Skin:    Findings: Rash (papular bilateral to chest, not pruritic) present.  Neurological:     Mental Status: He is alert.  Psychiatric:        Mood and Affect: Mood normal.        Behavior: Behavior normal.       Results for orders placed or performed in visit on 05/22/20  Vitamin B12  Result Value Ref Range   Vitamin B-12 291 211 - 911 pg/mL  Folate  Result Value Ref Range   Folate 23.5 >5.9 ng/mL  VITAMIN D 25 Hydroxy (Vit-D Deficiency, Fractures)  Result Value Ref Range   VITD 23.30 (L) 30.00 - 100.00 ng/mL  TSH  Result Value Ref Range   TSH 0.11 (L) 0.35 - 4.50 uIU/mL  Comprehensive metabolic panel  Result Value Ref Range   Sodium 142 135 - 145 mEq/L   Potassium 4.1 3.5 - 5.1 mEq/L   Chloride 102 96 - 112 mEq/L   CO2 32 19 - 32 mEq/L     Glucose, Bld 125 (H) 70 - 99 mg/dL   BUN 14 6 - 23 mg/dL   Creatinine, Ser 0.85 0.40 - 1.50  mg/dL   Total Bilirubin 0.9 0.2 - 1.2 mg/dL   Alkaline Phosphatase 127 (H) 39 - 117 U/L   AST 49 (H) 0 - 37 U/L   ALT 34 0 - 53 U/L   Total Protein 6.2 6.0 - 8.3 g/dL   Albumin 3.6 3.5 - 5.2 g/dL   GFR 92.69 >60.00 mL/min   Calcium 8.3 (L) 8.4 - 10.5 mg/dL  CBC with Differential/Platelet  Result Value Ref Range   WBC 7.4 4.0 - 10.5 K/uL   RBC 3.61 (L) 4.22 - 5.81 Mil/uL   Hemoglobin 12.9 (L) 13.0 - 17.0 g/dL   HCT 38.2 (L) 39 - 52 %   MCV 106.0 (H) 78.0 - 100.0 fl   MCHC 33.7 30.0 - 36.0 g/dL   RDW 16.6 (H) 11.5 - 15.5 %   Platelets 276.0 150 - 400 K/uL   Neutrophils Relative % 60.0 43 - 77 %   Lymphocytes Relative 26.5 12 - 46 %   Monocytes Relative 8.0 3 - 12 %   Eosinophils Relative 4.5 0 - 5 %   Basophils Relative 1.0 0 - 3 %   Neutro Abs 4.5 1.4 - 7.7 K/uL   Lymphs Abs 2.0 0.7 - 4.0 K/uL   Monocytes Absolute 0.6 0 - 1 K/uL   Eosinophils Absolute 0.3 0 - 0 K/uL   Basophils Absolute 0.1 0 - 0 K/uL   Assessment & Plan:  This visit occurred during the SARS-CoV-2 public health emergency.  Safety protocols were in place, including screening questions prior to the visit, additional usage of staff PPE, and extensive cleaning of exam room while observing appropriate contact time as indicated for disinfecting solutions.   Problem List Items Addressed This Visit    Vitamin D deficiency   Relevant Orders   VITAMIN D 25 Hydroxy (Vit-D Deficiency, Fractures) (Completed)   Vitamin B12 deficiency    B12 shot today while in office.  He has been regular with vitamins and supplements.  Check levels (pre-shot), continue oral b12 replacement.       Relevant Orders   Vitamin B12 (Completed)   Transaminitis    Seems he missed abdominal ultrasound this morning - will call to reschedule       Thiamine deficiency   Relevant Orders   Vitamin B1   Pedal edema - Primary    Marked, worsening.  We have stopped amlodipine in an effort to improve edema without much benefit. I think this is liver (hypoalbuminemia) related. Continue elevation of legs, will trial compression stockings (Rx provided today). Good pulses point against PAD. Will continue titrating lasix dose to 76m daily - current dose ineffective for diuresis.       Relevant Orders   TSH (Completed)   Comprehensive metabolic panel (Completed)   CBC with Differential/Platelet (Completed)   Folate deficiency   Relevant Orders   Folate (Completed)   Essential hypertension    BP stable off amlodipine - will stay off, continue enalapril and metoprolol BID.       Relevant Medications   furosemide (LASIX) 40 MG tablet   Diabetes mellitus type 1, controlled [place]    Reviewed dangers of insulin without meal.       Alcohol abuse    Continue to encourage full cessation. He continues slowly tapering alcohol intake - down to 3 mixed drinks/day (previously 12 oz liquor daily equivalent to 8 standard drinks/day).       Acute diarrhea    Fortunately fecal fat and C diff stool tests  returned normal.  This has significantly improved with lower alcohol intake.           Meds ordered this encounter  Medications   furosemide (LASIX) 40 MG tablet    Sig: Take 1 tablet (40 mg total) by mouth daily.    Dispense:  30 tablet    Refill:  3   cyanocobalamin ((VITAMIN B-12)) injection 1,000 mcg   Orders Placed This Encounter  Procedures   Vitamin B12   Folate   VITAMIN D 25 Hydroxy (Vit-D Deficiency, Fractures)   Vitamin B1   TSH   Comprehensive metabolic panel   CBC with Differential/Platelet   Patient Instructions  Labs today. Then B12 shot today  Increase furosemide to 71m daily, let me know effect on urination. Call imaging center about abdominal ultrasound - looks like it was scheduled for today.  Start using compression stockings.  We will be in touch with results.     Follow up plan: No follow-ups on  file.  JRia Bush MD

## 2020-05-22 NOTE — Assessment & Plan Note (Addendum)
Marked, worsening. We have stopped amlodipine in an effort to improve edema without much benefit. I think this is liver (hypoalbuminemia) related. Continue elevation of legs, will trial compression stockings (Rx provided today). Good pulses point against PAD. Will continue titrating lasix dose to 40mg  daily - current dose ineffective for diuresis.

## 2020-05-22 NOTE — Assessment & Plan Note (Addendum)
Fortunately fecal fat and C diff stool tests returned normal.  This has significantly improved with lower alcohol intake.

## 2020-05-23 ENCOUNTER — Other Ambulatory Visit (INDEPENDENT_AMBULATORY_CARE_PROVIDER_SITE_OTHER): Payer: BC Managed Care – PPO

## 2020-05-23 ENCOUNTER — Ambulatory Visit
Admission: RE | Admit: 2020-05-23 | Discharge: 2020-05-23 | Disposition: A | Discharge status: Home / Self Care | Payer: BC Managed Care – PPO | Source: Ambulatory Visit | Attending: Family Medicine | Admitting: Family Medicine

## 2020-05-23 DIAGNOSIS — R634 Abnormal weight loss: Secondary | ICD-10-CM

## 2020-05-23 DIAGNOSIS — K760 Fatty (change of) liver, not elsewhere classified: Secondary | ICD-10-CM | POA: Diagnosis not present

## 2020-05-23 DIAGNOSIS — F1010 Alcohol abuse, uncomplicated: Secondary | ICD-10-CM

## 2020-05-23 DIAGNOSIS — R600 Localized edema: Secondary | ICD-10-CM | POA: Diagnosis not present

## 2020-05-23 DIAGNOSIS — R7989 Other specified abnormal findings of blood chemistry: Secondary | ICD-10-CM | POA: Diagnosis not present

## 2020-05-23 DIAGNOSIS — E1021 Type 1 diabetes mellitus with diabetic nephropathy: Secondary | ICD-10-CM | POA: Diagnosis not present

## 2020-05-23 DIAGNOSIS — I10 Essential (primary) hypertension: Secondary | ICD-10-CM

## 2020-05-23 DIAGNOSIS — K7689 Other specified diseases of liver: Secondary | ICD-10-CM | POA: Diagnosis not present

## 2020-05-23 DIAGNOSIS — I714 Abdominal aortic aneurysm, without rupture: Secondary | ICD-10-CM | POA: Diagnosis not present

## 2020-05-23 LAB — MAGNESIUM: Magnesium: 1.4 mg/dL — ABNORMAL LOW (ref 1.5–2.5)

## 2020-05-23 LAB — T4, FREE: Free T4: 1.03 ng/dL (ref 0.60–1.60)

## 2020-05-26 LAB — VITAMIN B1: Vitamin B1 (Thiamine): 10 nmol/L (ref 8–30)

## 2020-05-27 ENCOUNTER — Encounter: Payer: Self-pay | Admitting: Family Medicine

## 2020-05-27 DIAGNOSIS — I714 Abdominal aortic aneurysm, without rupture, unspecified: Secondary | ICD-10-CM | POA: Insufficient documentation

## 2020-06-02 IMAGING — CR DG ANKLE COMPLETE 3+V*L*
1 series · 3 of 3 positions shown · non-contrast
Comparison: None.

CLINICAL DATA: Tripping injury, foot and ankle pain.

EXAM:
LEFT ANKLE COMPLETE - 3+ VIEW

[Series 1: dg ankle complete left · 0.14mm/px · 3 of 3 slices shown]
[im 1/3]
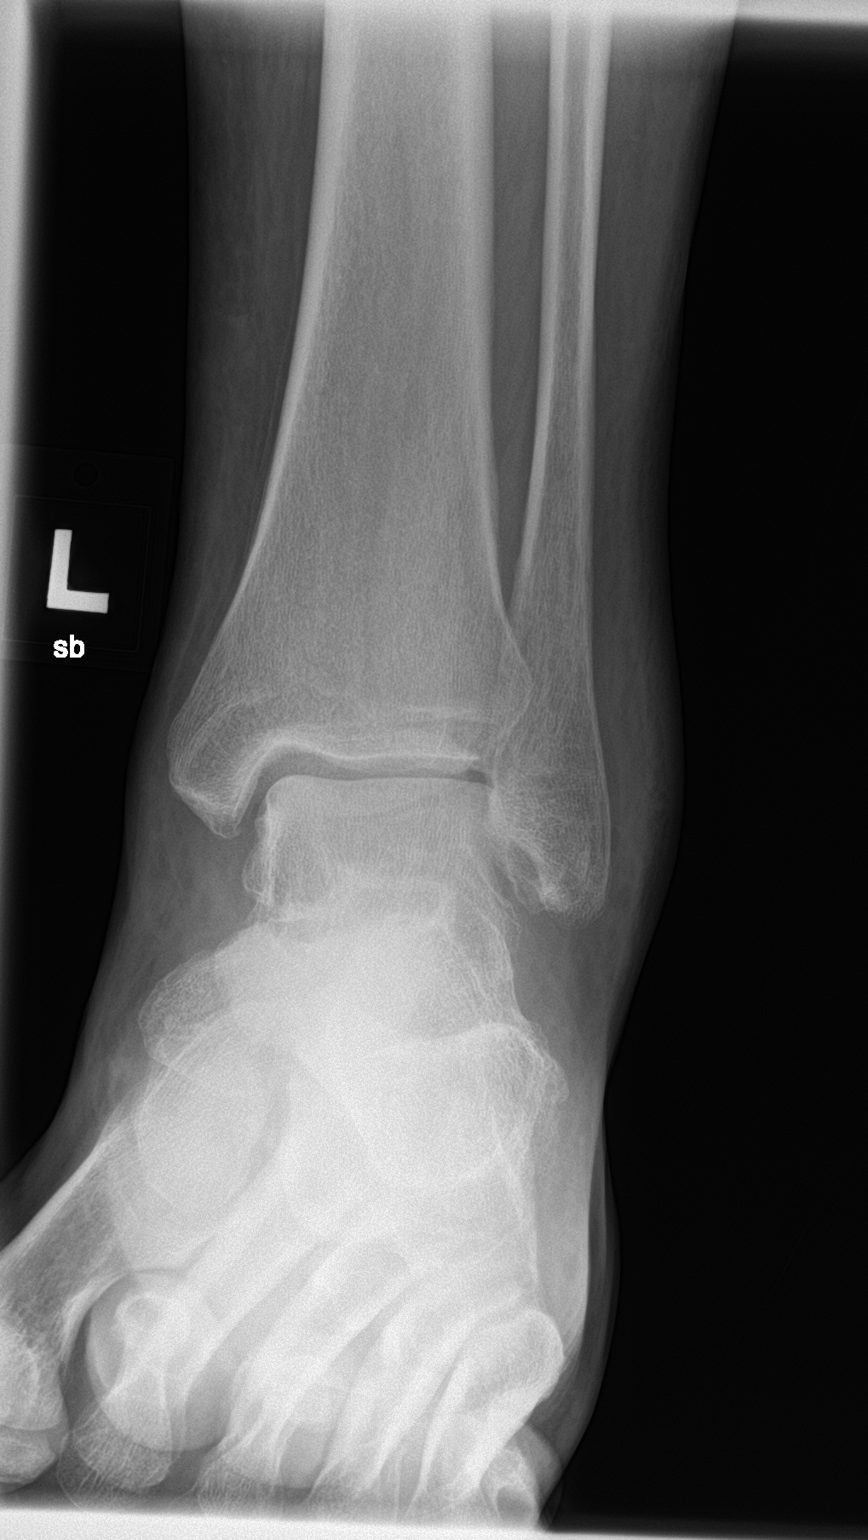
[im 2/3]
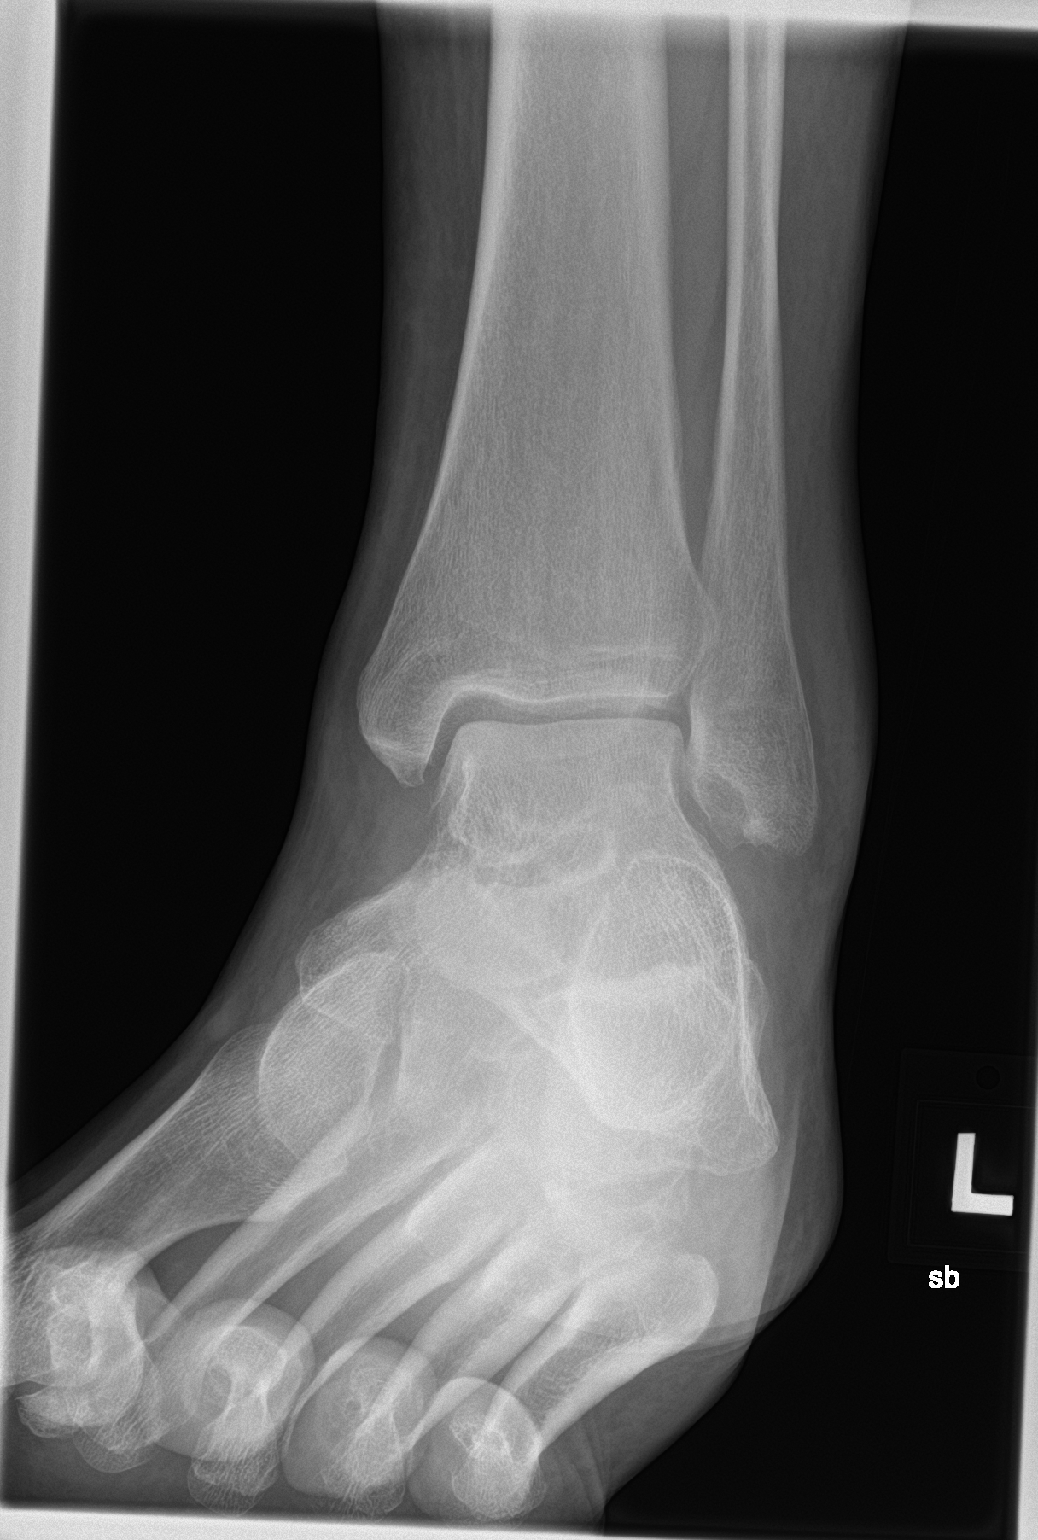
[im 3/3]
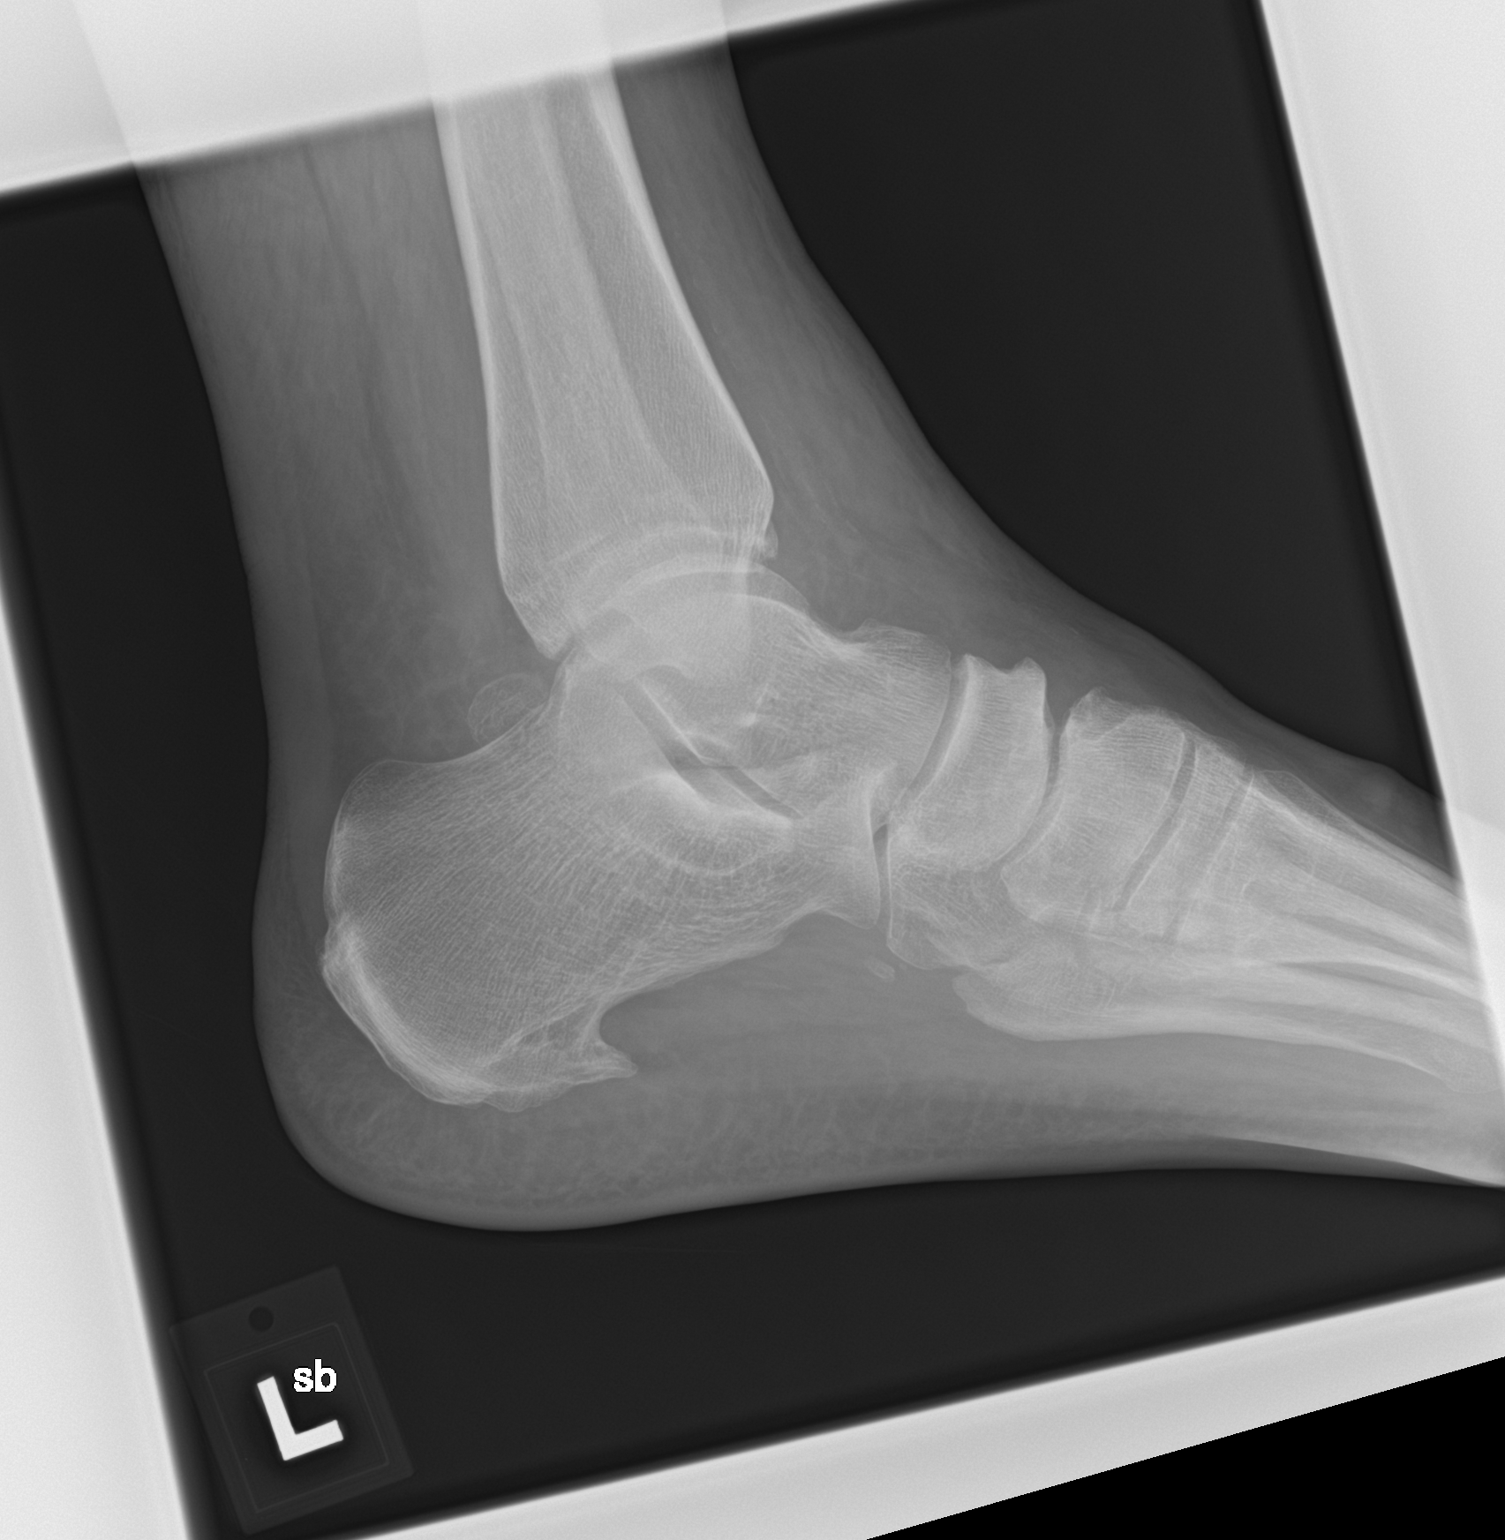

[3 of 3 positions shown; findings below may reference images not displayed]

FINDINGS: Soft tissue swelling overlying the lateral malleolus. Plafond and
talar dome appear intact. No malleolar fracture noted.

Plantar calcaneal spur. Mild dorsal spurring in the midfoot.

Type 1 accessory navicular.
IMPRESSION: 1. Soft tissue swelling overlying the lateral malleolus, without
underlying fracture or acute bony findings.
2. Plantar calcaneal spur.

## 2020-07-05 ENCOUNTER — Other Ambulatory Visit: Payer: Self-pay | Admitting: Family Medicine

## 2020-07-11 ENCOUNTER — Telehealth: Payer: Self-pay

## 2020-07-11 NOTE — Telephone Encounter (Signed)
Contacted patient to schedule lung CT screening scan after receiving referral from Dr. Danise Mina.  I explained program to patient and he is agreeable but prefers to wait and schedule for a few weeks.  He is agreeable for our program to call him back in 2 to 3 weeks to schedule scan.  Patient is a current smoker.

## 2020-07-15 ENCOUNTER — Other Ambulatory Visit: Payer: Self-pay | Admitting: Internal Medicine

## 2020-07-21 ENCOUNTER — Other Ambulatory Visit: Payer: Self-pay | Admitting: Internal Medicine

## 2020-07-24 ENCOUNTER — Other Ambulatory Visit: Payer: Self-pay | Admitting: Family Medicine

## 2020-07-24 ENCOUNTER — Telehealth: Payer: Self-pay

## 2020-07-24 NOTE — Telephone Encounter (Signed)
Attempted to contact pt.  No answer.  Vm not set up.  Need to relay Dr. G's message.  

## 2020-07-24 NOTE — Telephone Encounter (Signed)
The 50mg  Viagra is not working. He said it is not due to alcohol. It is not enough. Says he needs a stronger dose. Please advise [phone].

## 2020-07-24 NOTE — Telephone Encounter (Signed)
Attempted to return pt's call.  No answer.  Vm box not set up.

## 2020-07-24 NOTE — Telephone Encounter (Signed)
Patient contacted the office asking to speak with Russell Gomez. He states he really needs her to call him back ASAP. He would not relay any information to me.

## 2020-07-24 NOTE — Telephone Encounter (Signed)
plz notify 100mg  dose sent to pharmacy.

## 2020-07-25 NOTE — Telephone Encounter (Signed)
Notified pt, by phn, new rx sent.  Verbalizes understanding.

## 2020-07-28 ENCOUNTER — Telehealth: Payer: Self-pay

## 2020-07-28 NOTE — Telephone Encounter (Signed)
Contacted patient today for lung CT screening clinic.  We had spoken in early September and he had asked [place] to call back. Patient feels like Dr. Leo Grosser has "worked him up" sufficiently recently and does not want to participate in the program at this time.  He may be open to participation in the future.

## 2020-08-29 ENCOUNTER — Other Ambulatory Visit: Payer: Self-pay | Admitting: Family Medicine

## 2020-09-03 ENCOUNTER — Other Ambulatory Visit: Payer: Self-pay

## 2020-09-03 MED ORDER — FUROSEMIDE 40 MG PO TABS
40.0000 mg | ORAL_TABLET | Freq: Every day | ORAL | 3 refills | Status: DC
Start: 2020-09-03 — End: 2021-01-14

## 2020-10-07 ENCOUNTER — Other Ambulatory Visit: Payer: Self-pay | Admitting: Internal Medicine

## 2020-10-07 ENCOUNTER — Other Ambulatory Visit: Payer: Self-pay | Admitting: Family Medicine

## 2020-10-13 ENCOUNTER — Other Ambulatory Visit (INDEPENDENT_AMBULATORY_CARE_PROVIDER_SITE_OTHER): Payer: BC Managed Care – PPO

## 2020-10-13 ENCOUNTER — Encounter: Payer: Self-pay | Admitting: Family Medicine

## 2020-10-13 ENCOUNTER — Other Ambulatory Visit: Payer: Self-pay | Admitting: Family Medicine

## 2020-10-13 ENCOUNTER — Telehealth (INDEPENDENT_AMBULATORY_CARE_PROVIDER_SITE_OTHER): Payer: BC Managed Care – PPO | Admitting: Family Medicine

## 2020-10-13 VITALS — BP 131/76 | HR 108 | Ht 74.0 in | Wt 230.0 lb

## 2020-10-13 DIAGNOSIS — R059 Cough, unspecified: Secondary | ICD-10-CM

## 2020-10-13 DIAGNOSIS — F17200 Nicotine dependence, unspecified, uncomplicated: Secondary | ICD-10-CM

## 2020-10-13 DIAGNOSIS — E1069 Type 1 diabetes mellitus with other specified complication: Secondary | ICD-10-CM

## 2020-10-13 LAB — POC INFLUENZA A&B (BINAX/QUICKVUE)
Influenza A, POC: NEGATIVE
Influenza B, POC: NEGATIVE

## 2020-10-13 MED ORDER — GUAIFENESIN-CODEINE 100-10 MG/5ML PO SYRP
5.0000 mL | ORAL_SOLUTION | Freq: Two times a day (BID) | ORAL | 0 refills | Status: DC | PRN
Start: 2020-10-13 — End: 2020-12-27

## 2020-10-13 NOTE — Progress Notes (Signed)
Patient ID: Russell Gomez, male    DOB: January 23, 1961, 59 y.o.   MRN: [id number]  Virtual visit completed through [employer], a video enabled telemedicine application. Due to national recommendations of social distancing due to COVID-19, a virtual visit is felt to be most appropriate for this patient at this time. Reviewed limitations, risks, security and privacy concerns of performing a virtual visit and the availability of in person appointments. I also reviewed that there may be a patient responsible charge related to this service. The patient agreed to proceed.   Patient location: home Provider location: [place] at [facility], office Persons participating in this virtual visit: patient, provider   If any vitals were documented, they were collected by patient at home unless specified below.    BP 131/76   Pulse (!) 108   Ht _0  (1.88 m)   Wt 230 lb (104.3 kg)   BMI 29.53 kg/m   Pulse Readings from Last 3 Encounters:  10/13/20 (!) 108  05/22/20 92  04/28/20 87    CC: cough Subjective:   HPI: Russell Gomez is a 59 y.o. male presenting on 10/13/2020 for Cough (C/o productive cough with clear mucous, chest pain due to coughing so much, SOB, loss taste/smell, loss of appetite, fatigue and body aches.  Sxs started 10/10/20.  No COVID vaccines. )   First day of symptoms actually 10/09/2020.   4d h/o productive cough of clear mucous, sinus congestion pressure, body aches, fatigue, loss of taste/smell, mildly worsening dyspnea. Endorses increased dyspnea, increased cough, increased sputum production. He is wheezing as well.  R ribcage pain started last week from excessive coughing - no reproducible pain with palpation at the area.  Does feel chills but doesn't feel feverish.  No known COVID exposure.  Has not received COVID vaccines.  Notes cbg's trending up since sick.  Lab Results  Component Value Date   HGBA1C 7.5 (H) 12/03/2019  Last A1c with endo - unsure. Upcoming endo appt  this month Honor Junes).   Continues 1 PPD smoker.  CXR from 04/2020 showing chronic bronchitis changes      Relevant past medical, surgical, family and social history reviewed and updated as indicated. Interim medical history since our last visit reviewed. Allergies and medications reviewed and updated. Outpatient Medications Prior to Visit  Medication Sig Dispense Refill  . aspirin (ASPIRIN EC) 81 MG EC tablet Take 1 tablet (81 mg total) by mouth every Monday, Wednesday, and Friday. Swallow whole. 30 tablet   . Blood Glucose Monitoring Suppl (ACCU-CHEK GUIDE) w/Device KIT See admin instructions.    . Cholecalciferol (VITAMIN D3) 1.25 MG (50000 UT) TABS Take 1 tablet by mouth once a week. 12 tablet 1  . Coral Calcium (SM CORAL CALCIUM) 1000 (390 Ca) MG TABS Take 1 tablet by mouth daily. 60 tablet   . Cyanocobalamin (B-12) 1000 MCG SUBL Place 1 tablet under the tongue daily. 30 tablet 11  . diphenoxylate-atropine (LOMOTIL) 2.5-0.025 MG tablet Take 1 tablet by mouth 3 (three) times daily as needed for diarrhea or loose stools. 30 tablet 0  . enalapril (VASOTEC) 20 MG tablet Take 1 tablet by mouth twice daily 90 tablet 1  . folic acid (FOLVITE) 1 MG tablet Take 1 tablet (1 mg total) by mouth daily. 30 tablet 11  . furosemide (LASIX) 40 MG tablet Take 1 tablet (40 mg total) by mouth daily. 30 tablet 3  . glucose blood (ONE TOUCH ULTRA TEST) test strip Use 6 (six) times daily    .  hydrOXYzine (ATARAX/VISTARIL) 25 MG tablet Take 0.5-1 tablets (12.5-25 mg total) by mouth 2 (two) times daily as needed for anxiety. 30 tablet 0  . insulin lispro (HUMALOG) 100 UNIT/ML injection 5-15 units before each meal based on carb intake and sugar    . Insulin NPH, Human,, Isophane, (HUMULIN N KWIKPEN) 100 UNIT/ML Kiwkpen Inject 9 Units into the skin 2 (two) times daily.    . Magnesium 500 MG CAPS Take 1 capsule (500 mg total) by mouth daily. 30 capsule 11  . metoprolol tartrate (LOPRESSOR) 50 MG tablet Take 1  tablet by mouth twice daily 180 tablet 0  . omeprazole (PRILOSEC) 40 MG capsule Take 1 capsule by mouth twice daily 180 capsule 3  . potassium chloride SA (KLOR-CON) 20 MEQ tablet Take 1 tablet (20 mEq total) by mouth daily. 90 tablet 1  . pravastatin (PRAVACHOL) 80 MG tablet Take 1 tablet by mouth once daily 90 tablet 2  . sildenafil (VIAGRA) 100 MG tablet Take 1 tablet (100 mg total) by mouth daily as needed for erectile dysfunction. 8 tablet 11  . thiamine 100 MG tablet Take 1 tablet (100 mg total) by mouth daily. 30 tablet 6   No facility-administered medications prior to visit.     Per HPI unless specifically indicated in ROS section below Review of Systems Objective:  BP 131/76   Pulse (!) 108   Ht _0  (1.88 m)   Wt 230 lb (104.3 kg)   BMI 29.53 kg/m   Wt Readings from Last 3 Encounters:  10/13/20 230 lb (104.3 kg)  05/22/20 243 lb 8 oz (110.5 kg)  04/28/20 230 lb 3 oz (104.4 kg)       Physical exam: Gen: alert, NAD, not ill appearing Pulm: speaks in complete sentences without increased work of breathing Psych: normal mood, normal thought content      Results for orders placed or performed in visit on 05/23/20  T4, free  Result Value Ref Range   Free T4 1.03 0.60 - 1.60 ng/dL  Magnesium  Result Value Ref Range   Magnesium 1.4 (L) 1.5 - 2.5 mg/dL   Assessment & Plan:   Problem List Items Addressed This Visit    TOBACCO ABUSE   Diabetes mellitus type 1, controlled (HCC)   Cough - Primary    Anticipate acute bronchitis in setting of smoker with chronic bronchitis changes on latest CXR. In setting of COVID pandemic, need to rule this out as well as influenza. I will ask him to come for curbside swabs. Recommend out of work at least until covid swab returns.  If flu positive - tamiflu If covid positive - quarantine for 10d total, mAb infusion treatment If both negative - rec Rx Augmentin course 5-7d.  Codeine cough syrup Rx PRN Cough.  Continue to encourage  smoking cessation.           Meds ordered this encounter  Medications  . guaiFENesin-codeine (ROBITUSSIN AC) 100-10 MG/5ML syrup    Sig: Take 5 mLs by mouth 2 (two) times daily as needed for cough (sedation precautions).    Dispense:  120 mL    Refill:  0   No orders of the defined types were placed in this encounter.   I discussed the assessment and treatment plan with the patient. The patient was provided an opportunity to ask questions and all were answered. The patient agreed with the plan and demonstrated an understanding of the instructions. The patient was advised to call back or seek  an in-person evaluation if the symptoms worsen or if the condition fails to improve as anticipated.  Follow up plan: Return if symptoms worsen or fail to improve.  Ria Bush, MD

## 2020-10-13 NOTE — Assessment & Plan Note (Addendum)
Anticipate acute bronchitis in setting of smoker with chronic bronchitis changes on latest CXR. In setting of COVID pandemic, need to rule this out as well as influenza. I will ask him to come for curbside swabs. Recommend out of work at least until covid swab returns.  If flu positive - tamiflu If covid positive - quarantine for 10d total, mAb infusion treatment If both negative - rec Rx Augmentin course 5-7d.  Codeine cough syrup Rx PRN Cough.  Continue to encourage smoking cessation.

## 2020-10-14 ENCOUNTER — Telehealth: Payer: Self-pay | Admitting: Family Medicine

## 2020-10-14 MED ORDER — AMOXICILLIN-POT CLAVULANATE 875-125 MG PO TABS: 1.0000 | ORAL_TABLET | Freq: Two times a day (BID) | ORAL | 0 refills | Status: AC

## 2020-10-14 MED ORDER — AMOXICILLIN-POT CLAVULANATE 875-125 MG PO TABS: 1.0000 | ORAL_TABLET | Freq: Two times a day (BID) | ORAL | 0 refills | Status: DC

## 2020-10-14 NOTE — Telephone Encounter (Signed)
Pt called in wanted to know about getting a antibiotic for chest. and wanted to know about results of the covid test and flu test.

## 2020-10-14 NOTE — Addendum Note (Signed)
Addended by: Ria Bush on: 10/14/2020 01:47 PM   Modules accepted: Orders

## 2020-10-14 NOTE — Telephone Encounter (Signed)
Spoke with pt relaying Dr. G's message. Pt verbalizes understanding.  

## 2020-10-14 NOTE — Telephone Encounter (Signed)
plz notify flu swab negative I've sent in antibiotic for him - take augmentin twice daily for 1 wk. covid swab pending. Recommend out of work until test returns and feeling better.

## 2020-10-15 LAB — SARS-COV-2, NAA 2 DAY TAT

## 2020-10-15 LAB — NOVEL CORONAVIRUS, NAA: SARS-CoV-2, NAA: NOT DETECTED

## 2020-10-15 LAB — SPECIMEN STATUS REPORT

## 2020-11-04 DIAGNOSIS — J019 Acute sinusitis, unspecified: Secondary | ICD-10-CM | POA: Diagnosis not present

## 2020-11-04 DIAGNOSIS — Z20828 Contact with and (suspected) exposure to other viral communicable diseases: Secondary | ICD-10-CM | POA: Diagnosis not present

## 2020-12-02 ENCOUNTER — Telehealth: Payer: Self-pay | Admitting: Family Medicine

## 2020-12-02 NOTE — Telephone Encounter (Signed)
See note below the access nurse note that pt was offered an appt but wanted to see what Dr Darnell Level would say first. I spoke with pt; pt said extreme swelling in both legs from knees down to ankles and feet for 1 1/2 - 2 wks. No redness and no pain. Pt has been taking furosemide 40 mg daily; pt has not missed med. When sitting pt does not keep feet; advised to keep feet elevated. Pt said swelling goes down overnight but swelling returns shortly after gets up in morning. No CP and no SOB. Pt does not want to schedule appt until Dr Darnell Level reviews and gives advise of what to do. Sending note to Dr Darnell Level and Lattie Haw CMA.

## 2020-12-02 NOTE — Telephone Encounter (Signed)
[name] - Client TELEPHONE ADVICE RECORD AccessNurse Patient Name: Russell Gomez Gender: Male DOB: [birth year] Age: 60 Y 2 M 5 D Return Phone Number: [phone] (Primary) Address: City/State/Zip: [place] [address] Client [facility] - Client Client Site [place] Physician Ria Bush - MD Contact Type Call Who Is Calling Patient / Member / Family / Caregiver Call Type Triage / Clinical Relationship To Patient Self Return Phone Number [phone] (Primary) Chief Complaint Feet swelling Reason for Call Symptomatic / Request for [place] states he is having extreme swelling in his feet, ankles, and legs and is fatigued. He almost went to the hospital last night. He deny's shortness of breath. Translation No Nurse Assessment Nurse: Laqueta Due, RN, Metallurgist (Eastern Time): 12/02/2020 3:08:02 PM Confirm and document reason for call. If symptomatic, describe symptoms. ---Pt having swelling in his feet, ankles, legs and is fatigued. started a little bit longer than 2 weeks ago. no fever Does the patient have any new or worsening symptoms? ---Yes Will a triage be completed? ---Yes Related visit to physician within the last 2 weeks? ---No Does the PT have any chronic conditions? (i.e. diabetes, asthma, this includes High risk factors for pregnancy, etc.) ---Yes List chronic conditions. ---diabetes, enlarged heart, HTN, high cholesterol Is this a behavioral health or substance abuse call? ---No Guidelines Guideline Title Affirmed Question Affirmed Notes Nurse Date/Time Eilene Ghazi Time) Leg Swelling and Edema [1] MODERATE leg swelling (e.g., swelling extends up to knees) AND [2] new-onset or worsening Laqueta Due, Therapist, sports, [employer] 12/02/2020 3:08:01 PM Disp. Time Eilene Ghazi Time) Disposition Final User 12/02/2020 3:15:43 PM See PCP within 24 Hours Yes Laqueta Due, RN,  Museum/gallery conservator PLEASE NOTE: All timestamps contained within this report are represented as [place] Standard Time. CONFIDENTIALTY NOTICE: This fax transmission is intended only for the addressee. It contains information that is legally privileged, confidential or otherwise protected from use or disclosure. If you are not the intended recipient, you are strictly prohibited from reviewing, disclosing, copying using or disseminating any of this information or taking any action in reliance on or regarding this information. If you have received this fax in error, please notify us immediately by telephone so that we can arrange for its return to [place]. Phone: [phone], Toll-Free: [phone], Fax: [phone] Page: 2 of 2 Call Id: [id number] [place] Disagree/Comply Comply Caller Understands Yes PreDisposition Did not know what to do Care Advice Given Per Guideline SEE PCP WITHIN 24 HOURS: * IF OFFICE WILL BE OPEN: You need to be examined within the next 24 hours. Call your doctor (or NP/PA) when the office opens and make an appointment. CARE ADVICE given per Leg Swelling and Edema (Adult) guideline. CALL BACK IF: * Breathing difficulty or chest pain occurs * You become worse LEG SWELLING OR EDEMA: * Elevate your legs or try to lie down one or two times a day for 20 minutes. * Avoid socks with an elastic band at the top. * Wear comfortable shoes. Referrals REFERRED TO PCP OFFICE

## 2020-12-02 NOTE — Telephone Encounter (Signed)
His legs and ankle that are swollen, and he wanted to know what to do. I did offer an apt but wanted to see what Dr. Darnell Level has to say

## 2020-12-02 NOTE — Telephone Encounter (Signed)
Recommend in office evaluation for labs and possible med change.

## 2020-12-03 NOTE — Telephone Encounter (Signed)
Attempted to call pt.  No answer.  Vm box not set up.  Need to relay Dr. Synthia Innocent message and schedule OV today as indicated.

## 2020-12-03 NOTE — Telephone Encounter (Signed)
Can he come in at 1pm or 4:30pm today?

## 2020-12-04 NOTE — Telephone Encounter (Signed)
Attempted to call pt.  No answer.  Vm box not set up.  Need to relay Dr. Synthia Innocent message and schedule OV asap as indicated.

## 2020-12-05 NOTE — Telephone Encounter (Signed)
May place at 12:30pm thanks.

## 2020-12-05 NOTE — Telephone Encounter (Signed)
Pt called in to report that he felt lightheaded last night and when he checked his BP with a wrist cuff it was 95/56 and BG was 125... ER/UC precautions given... per VO from Dr Darnell Level, pt is to only take Enalapril QD not BID and to stay hydrated drinking plenty of water... Pt expressed understanding    Appt scheduled for Monday at 2pm but pt states he works 3rd shift and will need to get back home as early as possible to get enough sleep before work. He is looking to come in before lunch time or in the morning  but there are no openings available... Please advise on if pt can be fit in earlier in the day

## 2020-12-08 ENCOUNTER — Ambulatory Visit: Payer: BC Managed Care – PPO | Admitting: Family Medicine

## 2020-12-08 NOTE — Telephone Encounter (Signed)
Patient missed appointment today but needed sooner appointment anyway and it wasn't rescheduled. Please call to reschedule appt - may place in open 12:30 slot this week.

## 2020-12-08 NOTE — Telephone Encounter (Signed)
Plz call pt to r/s on 12/10/20 at 12:30, if he can.

## 2020-12-08 NOTE — Telephone Encounter (Signed)
Noted  

## 2020-12-08 NOTE — Telephone Encounter (Addendum)
Pt scheduled for 2:00 today.  

## 2020-12-08 NOTE — Telephone Encounter (Signed)
Patient is scheduled EM 

## 2020-12-10 ENCOUNTER — Ambulatory Visit (INDEPENDENT_AMBULATORY_CARE_PROVIDER_SITE_OTHER): Payer: BC Managed Care – PPO | Admitting: Family Medicine

## 2020-12-10 ENCOUNTER — Other Ambulatory Visit: Payer: Self-pay

## 2020-12-10 ENCOUNTER — Encounter: Payer: Self-pay | Admitting: Family Medicine

## 2020-12-10 VITALS — BP 120/66 | HR 81 | Temp 98.4°F | Ht 74.0 in | Wt 220.1 lb

## 2020-12-10 DIAGNOSIS — E538 Deficiency of other specified B group vitamins: Secondary | ICD-10-CM | POA: Diagnosis not present

## 2020-12-10 DIAGNOSIS — R42 Dizziness and giddiness: Secondary | ICD-10-CM | POA: Diagnosis not present

## 2020-12-10 DIAGNOSIS — R9431 Abnormal electrocardiogram [ECG] [EKG]: Secondary | ICD-10-CM

## 2020-12-10 DIAGNOSIS — F1010 Alcohol abuse, uncomplicated: Secondary | ICD-10-CM

## 2020-12-10 DIAGNOSIS — I499 Cardiac arrhythmia, unspecified: Secondary | ICD-10-CM | POA: Insufficient documentation

## 2020-12-10 DIAGNOSIS — R600 Localized edema: Secondary | ICD-10-CM

## 2020-12-10 DIAGNOSIS — I10 Essential (primary) hypertension: Secondary | ICD-10-CM

## 2020-12-10 DIAGNOSIS — E559 Vitamin D deficiency, unspecified: Secondary | ICD-10-CM

## 2020-12-10 LAB — CBC WITH DIFFERENTIAL/PLATELET
Basophils Absolute: 0 10*3/uL (ref 0.0–0.1)
Basophils Relative: 0.4 % (ref 0.0–3.0)
Eosinophils Absolute: 0.2 10*3/uL (ref 0.0–0.7)
Eosinophils Relative: 2.1 % (ref 0.0–5.0)
HCT: 38 % — ABNORMAL LOW (ref 39.0–52.0)
Hemoglobin: 13.1 g/dL (ref 13.0–17.0)
Lymphocytes Relative: 46.1 % — ABNORMAL HIGH (ref 12.0–46.0)
Lymphs Abs: 3.5 10*3/uL (ref 0.7–4.0)
MCHC: 34.5 g/dL (ref 30.0–36.0)
MCV: 99.6 fl (ref 78.0–100.0)
Monocytes Absolute: 0.8 10*3/uL (ref 0.1–1.0)
Monocytes Relative: 11.2 % (ref 3.0–12.0)
Neutro Abs: 3 10*3/uL (ref 1.4–7.7)
Neutrophils Relative %: 40.2 % — ABNORMAL LOW (ref 43.0–77.0)
Platelets: 248 10*3/uL (ref 150.0–400.0)
RBC: 3.82 Mil/uL — ABNORMAL LOW (ref 4.22–5.81)
RDW: 15.6 % — ABNORMAL HIGH (ref 11.5–15.5)
WBC: 7.5 10*3/uL (ref 4.0–10.5)

## 2020-12-10 LAB — COMPREHENSIVE METABOLIC PANEL
ALT: 35 U/L (ref 0–53)
AST: 47 U/L — ABNORMAL HIGH (ref 0–37)
Albumin: 3.1 g/dL — ABNORMAL LOW (ref 3.5–5.2)
Alkaline Phosphatase: 230 U/L — ABNORMAL HIGH (ref 39–117)
BUN: 9 mg/dL (ref 6–23)
CO2: 40 mEq/L — ABNORMAL HIGH (ref 19–32)
Calcium: 6.6 mg/dL — ABNORMAL LOW (ref 8.4–10.5)
Chloride: 92 mEq/L — ABNORMAL LOW (ref 96–112)
Creatinine, Ser: 0.85 mg/dL (ref 0.40–1.50)
GFR: 95.99 mL/min (ref >60.00)
Glucose, Bld: 90 mg/dL (ref 70–99)
Potassium: 3.3 mEq/L — ABNORMAL LOW (ref 3.5–5.1)
Sodium: 140 mEq/L (ref 135–145)
Total Bilirubin: 0.8 mg/dL (ref 0.2–1.2)
Total Protein: 5.7 g/dL — ABNORMAL LOW (ref 6.0–8.3)

## 2020-12-10 LAB — VITAMIN D 25 HYDROXY (VIT D DEFICIENCY, FRACTURES): VITD: 12.52 ng/mL — ABNORMAL LOW (ref 30.00–100.00)

## 2020-12-10 LAB — TSH: TSH: 0.08 u[IU]/mL — ABNORMAL LOW (ref 0.35–4.50)

## 2020-12-10 LAB — MAGNESIUM: Magnesium: 1.1 mg/dL — ABNORMAL LOW (ref 1.5–2.5)

## 2020-12-10 LAB — VITAMIN B12: Vitamin B-12: 358 pg/mL (ref 211–911)

## 2020-12-10 MED ORDER — ENALAPRIL MALEATE 20 MG PO TABS: 20.0000 mg | ORAL_TABLET | Freq: Every day | ORAL | 1 refills | Status: DC

## 2020-12-10 NOTE — Assessment & Plan Note (Signed)
Stable on lower enalapril dose - continue this and metoprolol bid.

## 2020-12-10 NOTE — Assessment & Plan Note (Signed)
Anticipate hypotensive event led to last week's symptoms - overall feeling better since enalapril dose was dropped. Continue lower dose. Check labwork today. Reviewed importance of good hydration status.

## 2020-12-10 NOTE — Assessment & Plan Note (Signed)
Check EKG - not consistent with afib.  Check labs (TSH, CBC)

## 2020-12-10 NOTE — Assessment & Plan Note (Addendum)
He continues dropping alcohol intake, down to 2-3 mixed drinks/day. Encouraged ongoing tapering for goal full cessation.  Discussed concerning effect of alcohol on heart.

## 2020-12-10 NOTE — Assessment & Plan Note (Addendum)
Acutely worse earlier last week now significantly improved.  Anticipate AKI from hypotension led to worsened pedal edema and now BPs are better this has also improved.

## 2020-12-10 NOTE — Patient Instructions (Addendum)
Labs today. I'm glad you're feeling some better. Continue once daily enalapril along with metoprolol 50mg  twice daily.  EKG returned abnormal but it doesn't seem to be atrial fibrillation. I'd still like to refer you to cardiologist for further evaluation and will order heart ultrasound.  Ensure staying well hydrated.  Good job with less alcohol, keep trying to back down.

## 2020-12-10 NOTE — Progress Notes (Signed)
Patient ID: Russell Gomez, male    DOB: Aug 25, 1961, 60 y.o.   MRN: [id number]  This visit was conducted in person.  BP 120/66 (BP Location: Left Arm, Patient Position: Sitting, Cuff Size: Normal)   Pulse 81   Temp 98.4 F (36.9 C) (Temporal)   Ht 6' 2" (1.88 m)   Wt 220 lb 2 oz (99.8 kg)   SpO2 98%   BMI 28.26 kg/m   Orthostatic VS for the past 24 hrs (Last 3 readings):  BP- Lying BP- Standing at 3 minutes  12/10/20 1245 -- 140/70  12/10/20 1243 142/78 --   CC: dizziness Subjective:   HPI: Russell Gomez is a 60 y.o. male presenting on 12/10/2020 for Dizziness (C/o dizziness. Started about 2 wks ago.  Decreased dose of enalapril, per Dr. Darnell Level.  Seems to be helping. ) and [name] about 1 wk ago at home. Injured chin and mid chest.  Also, has some tenderness on bilateral sides.  All pain is improving. )   2 wk h/o dizziness - seems to be improving since dropping enalapril dose to 66m once daily (from BID). He also continues metoprolol 562mbid and lasix 4024maily.   Did have fall at home - slipped on slick spot on floor, landed on anterior chest and chin - overall improving. This happened 12/01/2020.  Denies other chest pain, dyspnea, nausea/vomiting, abd pain.  Trying to limit salt intake.   Separated from wife - less stress since this. Attributes improving blood pressures to less stress.   Off/on ankle swelling present since last year - amlodipine stopped last year due to ankle swelling. Overall improved from last week. Continues potassium and magnesium and folic acid.   Alcohol - better. Down to 2-3 mixed drinks/day (a couple shots per drink) from 1/2 gallon previously.   Diarrhea has also improved - better since backing down off alcohol.   Continues 3rd shift - works nights starting at 10:30pm.      Relevant past medical, surgical, family and social history reviewed and updated as indicated. Interim medical history since our last visit reviewed. Allergies and  medications reviewed and updated. Outpatient Medications Prior to Visit  Medication Sig Dispense Refill  . aspirin 81 MG EC tablet Take 1 tablet (81 mg total) by mouth every Monday, Wednesday, and Friday. Swallow whole. 30 tablet   . Blood Glucose Monitoring Suppl (ACCU-CHEK GUIDE) w/Device KIT See admin instructions.    . Cholecalciferol (VITAMIN D3) 1.25 MG (50000 UT) TABS Take 1 tablet by mouth once a week. 12 tablet 1  . Coral Calcium (SM CORAL CALCIUM) 1000 (390 Ca) MG TABS Take 1 tablet by mouth daily. 60 tablet   . Cyanocobalamin (B-12) 1000 MCG SUBL Place 1 tablet under the tongue daily. 30 tablet 11  . folic acid (FOLVITE) 1 MG tablet Take 1 tablet (1 mg total) by mouth daily. 30 tablet 11  . furosemide (LASIX) 40 MG tablet Take 1 tablet (40 mg total) by mouth daily. 30 tablet 3  . glucose blood test strip Use 6 (six) times daily    . guaiFENesin-codeine (ROBITUSSIN AC) 100-10 MG/5ML syrup Take 5 mLs by mouth 2 (two) times daily as needed for cough (sedation precautions). 120 mL 0  . hydrOXYzine (ATARAX/VISTARIL) 25 MG tablet Take 0.5-1 tablets (12.5-25 mg total) by mouth 2 (two) times daily as needed for anxiety. 30 tablet 0  . insulin lispro (HUMALOG) 100 UNIT/ML injection 5-15 units before each meal based on carb  intake and sugar    . Insulin NPH, Human,, Isophane, (HUMULIN N KWIKPEN) 100 UNIT/ML Kiwkpen Inject 9 Units into the skin 2 (two) times daily.    . Magnesium 500 MG CAPS Take 1 capsule (500 mg total) by mouth daily. 30 capsule 11  . metoprolol tartrate (LOPRESSOR) 50 MG tablet Take 1 tablet by mouth twice daily 180 tablet 0  . omeprazole (PRILOSEC) 40 MG capsule Take 1 capsule by mouth twice daily 180 capsule 3  . potassium chloride SA (KLOR-CON) 20 MEQ tablet Take 1 tablet (20 mEq total) by mouth daily. 90 tablet 1  . pravastatin (PRAVACHOL) 80 MG tablet Take 1 tablet by mouth once daily 90 tablet 2  . sildenafil (VIAGRA) 100 MG tablet Take 1 tablet (100 mg total) by mouth  daily as needed for erectile dysfunction. 8 tablet 11  . thiamine 100 MG tablet Take 1 tablet (100 mg total) by mouth daily. 30 tablet 6  . diphenoxylate-atropine (LOMOTIL) 2.5-0.025 MG tablet Take 1 tablet by mouth 3 (three) times daily as needed for diarrhea or loose stools. 30 tablet 0  . enalapril (VASOTEC) 20 MG tablet Take 1 tablet by mouth twice daily (Patient taking differently: Taking 1 tablet at bedtime) 90 tablet 1   No facility-administered medications prior to visit.     Per HPI unless specifically indicated in ROS section below Review of Systems Objective:  BP 120/66 (BP Location: Left Arm, Patient Position: Sitting, Cuff Size: Normal)   Pulse 81   Temp 98.4 F (36.9 C) (Temporal)   Ht 6' 2" (1.88 m)   Wt 220 lb 2 oz (99.8 kg)   SpO2 98%   BMI 28.26 kg/m   Wt Readings from Last 3 Encounters:  12/10/20 220 lb 2 oz (99.8 kg)  10/13/20 230 lb (104.3 kg)  05/22/20 243 lb 8 oz (110.5 kg)      Physical Exam Vitals and nursing note reviewed.  Constitutional:      Appearance: Normal appearance. He is not ill-appearing.  Cardiovascular:     Rate and Rhythm: Tachycardia present. Rhythm irregularly irregular.     Pulses: Normal pulses.     Heart sounds: No murmur heard.   Pulmonary:     Effort: Pulmonary effort is normal. No respiratory distress.     Breath sounds: No wheezing, rhonchi or rales.     Comments:  Coarse throughout Swelling L upper costochondral junction No tenderness midline sternum Chest:     Chest wall: Tenderness present.  Abdominal:     General: Abdomen is flat. Bowel sounds are normal. There is no distension.     Palpations: Abdomen is soft. There is no hepatomegaly, splenomegaly or mass.     Tenderness: There is no abdominal tenderness. There is no guarding or rebound. Negative signs include Murphy's sign.     Hernia: No hernia is present.  Musculoskeletal:     Right lower leg: Edema (1+) present.     Left lower leg: Edema (1+) present.   Skin:    General: Skin is warm and dry.     Findings: Bruising present. No rash.  Neurological:     Mental Status: He is alert.  Psychiatric:        Mood and Affect: Mood normal.        Behavior: Behavior normal.       Results for orders placed or performed in visit on 10/13/20  Novel Coronavirus, NAA ([facility])   Specimen: Nasopharyngeal(NP) swabs in vial transport medium  Nasopharynge  Result Value Ref Range   SARS-CoV-2, NAA Not Detected Not Detected  SARS-COV-2, NAA 2 DAY TAT   Nasopharynge  Result Value Ref Range   SARS-CoV-2, NAA 2 DAY TAT Performed   Specimen status report  Result Value Ref Range   specimen status report Comment   POC Influenza A&B (Binax test)  Result Value Ref Range   Influenza A, POC Negative Negative   Influenza B, POC Negative Negative   EKG - ?multifocal p waves present with frequent PVCs, LAD with LAFB, no acute ST/T changes Assessment & Plan:  This visit occurred during the SARS-CoV-2 public health emergency.  Safety protocols were in place, including screening questions prior to the visit, additional usage of staff PPE, and extensive cleaning of exam room while observing appropriate contact time as indicated for disinfecting solutions.   Problem List Items Addressed This Visit    Vitamin D deficiency   Relevant Orders   VITAMIN D 25 Hydroxy (Vit-D Deficiency, Fractures)   Vitamin B12 deficiency   Relevant Orders   Vitamin B12   Pedal edema    Acutely worse earlier last week now significantly improved.  Anticipate AKI from hypotension led to worsened pedal edema and now BPs are better this has also improved.       Irregular heart rhythm    Check EKG - not consistent with afib.  Check labs (TSH, CBC)      Relevant Orders   CBC with Differential/Platelet   Comprehensive metabolic panel   TSH   Essential hypertension    Stable on lower enalapril dose - continue this and metoprolol bid.      Relevant Medications   enalapril  (VASOTEC) 20 MG tablet   Dizziness - Primary    Anticipate hypotensive event led to last week's symptoms - overall feeling better since enalapril dose was dropped. Continue lower dose. Check labwork today. Reviewed importance of good hydration status.       Relevant Orders   CBC with Differential/Platelet   Comprehensive metabolic panel   Magnesium   TSH   EKG 12-Lead (Completed)   ECHOCARDIOGRAM COMPLETE   Alcohol abuse    He continues dropping alcohol intake, down to 2-3 mixed drinks/day. Encouraged ongoing tapering for goal full cessation.  Discussed concerning effect of alcohol on heart.       Abnormal EKG    Abnormal - changing p waves, frequent PVCs with possible LAFB. In diabetic will check echocardiogram and refer to cards for further evaluation. Discussed negative effect of alcohol on heart function - encouraged full cessation.       Relevant Orders   Ambulatory referral to Cardiology   ECHOCARDIOGRAM COMPLETE       Meds ordered this encounter  Medications  . enalapril (VASOTEC) 20 MG tablet    Sig: Take 1 tablet (20 mg total) by mouth at bedtime.    Dispense:  90 tablet    Refill:  1   Orders Placed This Encounter  Procedures  . CBC with Differential/Platelet  . Comprehensive metabolic panel  . Magnesium  . TSH  . Vitamin B12  . VITAMIN D 25 Hydroxy (Vit-D Deficiency, Fractures)  . Ambulatory referral to Cardiology    Referral Priority:   Routine    Referral Type:   Consultation    Referral Reason:   Specialty Services Required    Requested Specialty:   Cardiology    Number of Visits Requested:   1  . EKG 12-Lead  . ECHOCARDIOGRAM COMPLETE  Standing Status:   Future    Standing Expiration Date:   12/10/2021    Order Specific Question:   Where should this test be performed    Answer:   [facility]    Order Specific Question:   Perflutren DEFINITY (image enhancing agent) should be administered unless hypersensitivity or allergy exist    Answer:    Administer Perflutren    Order Specific Question:   Is a special reader required? (athlete or structural heart)    Answer:   No    Order Specific Question:   Does this study need to be read by the Structural team/Level 3 readers?    Answer:   No    Order Specific Question:   Reason for exam-Echo    Answer:   Abnormal ECG  R94.31    Patient Instructions  Labs today. I'm glad you're feeling some better. Continue once daily enalapril along with metoprolol 75m twice daily.  EKG returned abnormal but it doesn't seem to be atrial fibrillation. I'd still like to refer you to cardiologist for further evaluation and will order heart ultrasound.  Ensure staying well hydrated.  Good job with less alcohol, keep trying to back down.    Follow up plan: Return if symptoms worsen or fail to improve.  JRia Bush MD

## 2020-12-10 NOTE — Assessment & Plan Note (Addendum)
Abnormal - changing p waves, frequent PVCs with possible LAFB. In diabetic will check echocardiogram and refer to cards for further evaluation. Discussed negative effect of alcohol on heart function - encouraged full cessation.

## 2020-12-12 ENCOUNTER — Telehealth: Payer: Self-pay

## 2020-12-12 NOTE — Telephone Encounter (Signed)
Received faxed PA request from [place] for omprazole 40 mg DR cap; key:  [id number].  Decision pending.

## 2020-12-15 ENCOUNTER — Other Ambulatory Visit: Payer: Self-pay | Admitting: Family Medicine

## 2020-12-15 MED ORDER — PANTOPRAZOLE SODIUM 40 MG PO TBEC: 40.0000 mg | DELAYED_RELEASE_TABLET | Freq: Two times a day (BID) | ORAL | 1 refills | Status: DC

## 2020-12-15 MED ORDER — VITAMIN D3 1.25 MG (50000 UT) PO TABS: 1.0000 | ORAL_TABLET | ORAL | 3 refills | Status: DC

## 2020-12-15 MED ORDER — MAGNESIUM 500 MG PO CAPS: 1.0000 | ORAL_CAPSULE | Freq: Two times a day (BID) | ORAL | 11 refills | Status: DC

## 2020-12-15 NOTE — Addendum Note (Signed)
Addended by: Ria Bush on: 12/15/2020 07:25 AM   Modules accepted: Orders

## 2020-12-15 NOTE — Telephone Encounter (Signed)
Received faxed PA denial.  Reason:  Pt's health benefit plan does not cover any drug that is therapeutically equivalent to an OTC drug.  The OTC products contain the same active ingredients as the prescription product at the same or similar strengths.  FYI to Dr. Darnell Level.

## 2020-12-15 NOTE — Addendum Note (Signed)
Addended by: Ria Bush on: 12/15/2020 02:48 PM   Modules accepted: Orders

## 2020-12-15 NOTE — Telephone Encounter (Signed)
plz notify patient of denial.  I have sent pantoprazole 40mg  BID for him to price out.

## 2020-12-17 NOTE — Telephone Encounter (Addendum)
Attempted to contact pt.  No answer.  Vm box not set up.  Need to notify pt of omeprazole denial and relay Dr. Synthia Innocent message.   Also, need to schedule a lab visit this week (see Lab Result Note, 12/10/20).

## 2020-12-18 NOTE — Telephone Encounter (Signed)
Patient returned your call. Please call him back. EM

## 2020-12-18 NOTE — Telephone Encounter (Signed)
Attempted to contact pt.  No answer.  Vm box not set up.  Need to notify pt of omeprazole denial and relay Dr. Synthia Innocent message.   Also, need to schedule a lab visit this week (see Lab Result Note, 12/10/20).

## 2020-12-18 NOTE — Telephone Encounter (Signed)
Pt returning call.  I relayed Dr. Synthia Innocent message about med and recent lab result note.  I also mentioned lab info was mailed to him.  Pt verbalizes understanding.  Not able to schedule labs this week.  Schedule on 12/22/20 at 8:40.

## 2020-12-22 ENCOUNTER — Other Ambulatory Visit: Payer: BC Managed Care – PPO

## 2020-12-23 ENCOUNTER — Telehealth: Payer: Self-pay

## 2020-12-23 NOTE — Telephone Encounter (Addendum)
Received faxed PA denial.  Reason:  This medication is covered when 2 OTC generic PPIs (such as omeprazole, lansoprazole) have been tried and did not work, ore when the member cannot take all other OTC generic PPI meds.   Please note:  The alternative meds are OTC and are not covered by pharmacy benefits.

## 2020-12-25 ENCOUNTER — Other Ambulatory Visit: Payer: Self-pay | Admitting: Family Medicine

## 2020-12-26 ENCOUNTER — Emergency Department: Payer: BC Managed Care – PPO

## 2020-12-26 ENCOUNTER — Other Ambulatory Visit: Payer: Self-pay

## 2020-12-26 ENCOUNTER — Inpatient Hospital Stay
Admission: EM | Admit: 2020-12-26 | Discharge: 2020-12-29 | DRG: 309 | Disposition: A | Discharge status: Home / Self Care | Payer: BC Managed Care – PPO | Attending: Internal Medicine | Admitting: Internal Medicine

## 2020-12-26 DIAGNOSIS — I714 Abdominal aortic aneurysm, without rupture: Secondary | ICD-10-CM | POA: Diagnosis present

## 2020-12-26 DIAGNOSIS — R778 Other specified abnormalities of plasma proteins: Secondary | ICD-10-CM

## 2020-12-26 DIAGNOSIS — I444 Left anterior fascicular block: Secondary | ICD-10-CM | POA: Diagnosis present

## 2020-12-26 DIAGNOSIS — Z8249 Family history of ischemic heart disease and other diseases of the circulatory system: Secondary | ICD-10-CM

## 2020-12-26 DIAGNOSIS — E1021 Type 1 diabetes mellitus with diabetic nephropathy: Secondary | ICD-10-CM | POA: Diagnosis not present

## 2020-12-26 DIAGNOSIS — R197 Diarrhea, unspecified: Secondary | ICD-10-CM | POA: Diagnosis not present

## 2020-12-26 DIAGNOSIS — E876 Hypokalemia: Secondary | ICD-10-CM

## 2020-12-26 DIAGNOSIS — Z833 Family history of diabetes mellitus: Secondary | ICD-10-CM

## 2020-12-26 DIAGNOSIS — Z981 Arthrodesis status: Secondary | ICD-10-CM

## 2020-12-26 DIAGNOSIS — Z794 Long term (current) use of insulin: Secondary | ICD-10-CM

## 2020-12-26 DIAGNOSIS — I4891 Unspecified atrial fibrillation: Principal | ICD-10-CM

## 2020-12-26 DIAGNOSIS — F1010 Alcohol abuse, uncomplicated: Secondary | ICD-10-CM | POA: Diagnosis not present

## 2020-12-26 DIAGNOSIS — K529 Noninfective gastroenteritis and colitis, unspecified: Secondary | ICD-10-CM

## 2020-12-26 DIAGNOSIS — E785 Hyperlipidemia, unspecified: Secondary | ICD-10-CM | POA: Diagnosis present

## 2020-12-26 DIAGNOSIS — R112 Nausea with vomiting, unspecified: Secondary | ICD-10-CM

## 2020-12-26 DIAGNOSIS — R1110 Vomiting, unspecified: Secondary | ICD-10-CM | POA: Diagnosis not present

## 2020-12-26 DIAGNOSIS — D6959 Other secondary thrombocytopenia: Secondary | ICD-10-CM | POA: Diagnosis present

## 2020-12-26 DIAGNOSIS — N2581 Secondary hyperparathyroidism of renal origin: Secondary | ICD-10-CM | POA: Diagnosis not present

## 2020-12-26 DIAGNOSIS — Z7982 Long term (current) use of aspirin: Secondary | ICD-10-CM

## 2020-12-26 DIAGNOSIS — R110 Nausea: Secondary | ICD-10-CM | POA: Diagnosis not present

## 2020-12-26 DIAGNOSIS — Z7141 Alcohol abuse counseling and surveillance of alcoholic: Secondary | ICD-10-CM

## 2020-12-26 DIAGNOSIS — E1069 Type 1 diabetes mellitus with other specified complication: Secondary | ICD-10-CM | POA: Diagnosis not present

## 2020-12-26 DIAGNOSIS — Z20822 Contact with and (suspected) exposure to covid-19: Secondary | ICD-10-CM | POA: Diagnosis present

## 2020-12-26 DIAGNOSIS — Z885 Allergy status to narcotic agent status: Secondary | ICD-10-CM

## 2020-12-26 DIAGNOSIS — F1020 Alcohol dependence, uncomplicated: Secondary | ICD-10-CM

## 2020-12-26 DIAGNOSIS — E871 Hypo-osmolality and hyponatremia: Secondary | ICD-10-CM | POA: Diagnosis present

## 2020-12-26 DIAGNOSIS — I700 Atherosclerosis of aorta: Secondary | ICD-10-CM | POA: Diagnosis not present

## 2020-12-26 DIAGNOSIS — I248 Other forms of acute ischemic heart disease: Secondary | ICD-10-CM | POA: Diagnosis present

## 2020-12-26 DIAGNOSIS — I340 Nonrheumatic mitral (valve) insufficiency: Secondary | ICD-10-CM | POA: Diagnosis not present

## 2020-12-26 DIAGNOSIS — E108 Type 1 diabetes mellitus with unspecified complications: Secondary | ICD-10-CM | POA: Diagnosis present

## 2020-12-26 DIAGNOSIS — F17210 Nicotine dependence, cigarettes, uncomplicated: Secondary | ICD-10-CM | POA: Diagnosis present

## 2020-12-26 DIAGNOSIS — F10239 Alcohol dependence with withdrawal, unspecified: Secondary | ICD-10-CM | POA: Diagnosis not present

## 2020-12-26 DIAGNOSIS — K219 Gastro-esophageal reflux disease without esophagitis: Secondary | ICD-10-CM | POA: Diagnosis present

## 2020-12-26 DIAGNOSIS — R9431 Abnormal electrocardiogram [ECG] [EKG]: Secondary | ICD-10-CM | POA: Diagnosis not present

## 2020-12-26 DIAGNOSIS — I10 Essential (primary) hypertension: Secondary | ICD-10-CM | POA: Diagnosis not present

## 2020-12-26 DIAGNOSIS — I480 Paroxysmal atrial fibrillation: Secondary | ICD-10-CM | POA: Diagnosis not present

## 2020-12-26 DIAGNOSIS — I2489 Other forms of acute ischemic heart disease: Secondary | ICD-10-CM

## 2020-12-26 DIAGNOSIS — E109 Type 1 diabetes mellitus without complications: Secondary | ICD-10-CM | POA: Diagnosis present

## 2020-12-26 DIAGNOSIS — R7989 Other specified abnormal findings of blood chemistry: Secondary | ICD-10-CM

## 2020-12-26 DIAGNOSIS — Z888 Allergy status to other drugs, medicaments and biological substances status: Secondary | ICD-10-CM

## 2020-12-26 DIAGNOSIS — Z79899 Other long term (current) drug therapy: Secondary | ICD-10-CM

## 2020-12-26 DIAGNOSIS — F10939 Alcohol use, unspecified with withdrawal, unspecified: Secondary | ICD-10-CM

## 2020-12-26 LAB — CBC WITH DIFFERENTIAL/PLATELET
Abs Immature Granulocytes: 0.06 10*3/uL (ref 0.00–0.07)
Basophils Absolute: 0 10*3/uL (ref 0.0–0.1)
Basophils Relative: 0 %
Eosinophils Absolute: 0 10*3/uL (ref 0.0–0.5)
Eosinophils Relative: 0 %
HCT: 42.5 % (ref 39.0–52.0)
Hemoglobin: 15 g/dL (ref 13.0–17.0)
Immature Granulocytes: 1 %
Lymphocytes Relative: 15 %
Lymphs Abs: 1.6 10*3/uL (ref 0.7–4.0)
MCH: 34.5 pg — ABNORMAL HIGH (ref 26.0–34.0)
MCHC: 35.3 g/dL (ref 30.0–36.0)
MCV: 97.7 fL (ref 80.0–100.0)
Monocytes Absolute: 0.8 10*3/uL (ref 0.1–1.0)
Monocytes Relative: 7 %
Neutro Abs: 8.4 10*3/uL — ABNORMAL HIGH (ref 1.7–7.7)
Neutrophils Relative %: 77 %
Platelets: 196 10*3/uL (ref 150–400)
RBC: 4.35 MIL/uL (ref 4.22–5.81)
RDW: 14.7 % (ref 11.5–15.5)
WBC: 10.9 10*3/uL — ABNORMAL HIGH (ref 4.0–10.5)
nRBC: 0.3 % — ABNORMAL HIGH (ref 0.0–0.2)

## 2020-12-26 MED ORDER — MAGNESIUM SULFATE 2 GM/50ML IV SOLN: 2.0000 g | Freq: Once | INTRAVENOUS | Status: AC

## 2020-12-26 MED ORDER — ONDANSETRON HCL 4 MG/2ML IJ SOLN: 4.0000 mg | Freq: Once | INTRAMUSCULAR | Status: AC

## 2020-12-26 MED ORDER — FAMOTIDINE IN NACL 20-0.9 MG/50ML-% IV SOLN
20.0000 mg | Freq: Once | INTRAVENOUS | Status: AC
  Administered 2020-12-27: 20 mg via INTRAVENOUS
  Filled 2020-12-26 (×2): qty 50

## 2020-12-26 MED ORDER — MAGNESIUM SULFATE 2 GM/50ML IV SOLN
INTRAVENOUS | Status: AC
  Administered 2020-12-26: 2 g via INTRAVENOUS
  Filled 2020-12-26: qty 50

## 2020-12-26 MED ORDER — LACTATED RINGERS IV BOLUS
1000.0000 mL | Freq: Once | INTRAVENOUS | Status: AC
  Administered 2020-12-26: 1000 mL via INTRAVENOUS

## 2020-12-26 MED ORDER — ONDANSETRON HCL 4 MG/2ML IJ SOLN
INTRAMUSCULAR | Status: AC
  Administered 2020-12-26: 4 mg via INTRAVENOUS
  Filled 2020-12-26: qty 2

## 2020-12-26 NOTE — ED Triage Notes (Signed)
Pt presents to the [facility] via EMS from home with c/o nausea and vomiting that began 3 days ago. Pt reports 10+ episodes of emesis per day. Pt states that he has since developed substernal chest pain.

## 2020-12-27 ENCOUNTER — Inpatient Hospital Stay
Admit: 2020-12-27 | Discharge: 2020-12-27 | Disposition: A | Discharge status: Home / Self Care | Payer: BC Managed Care – PPO | Attending: Internal Medicine | Admitting: Internal Medicine

## 2020-12-27 DIAGNOSIS — I10 Essential (primary) hypertension: Secondary | ICD-10-CM | POA: Diagnosis present

## 2020-12-27 DIAGNOSIS — Z20822 Contact with and (suspected) exposure to covid-19: Secondary | ICD-10-CM | POA: Diagnosis present

## 2020-12-27 DIAGNOSIS — Z794 Long term (current) use of insulin: Secondary | ICD-10-CM | POA: Diagnosis not present

## 2020-12-27 DIAGNOSIS — K529 Noninfective gastroenteritis and colitis, unspecified: Secondary | ICD-10-CM | POA: Diagnosis present

## 2020-12-27 DIAGNOSIS — I714 Abdominal aortic aneurysm, without rupture: Secondary | ICD-10-CM | POA: Diagnosis present

## 2020-12-27 DIAGNOSIS — E876 Hypokalemia: Secondary | ICD-10-CM

## 2020-12-27 DIAGNOSIS — I248 Other forms of acute ischemic heart disease: Secondary | ICD-10-CM | POA: Diagnosis present

## 2020-12-27 DIAGNOSIS — Z7982 Long term (current) use of aspirin: Secondary | ICD-10-CM | POA: Diagnosis not present

## 2020-12-27 DIAGNOSIS — I340 Nonrheumatic mitral (valve) insufficiency: Secondary | ICD-10-CM | POA: Diagnosis present

## 2020-12-27 DIAGNOSIS — D6959 Other secondary thrombocytopenia: Secondary | ICD-10-CM | POA: Diagnosis present

## 2020-12-27 DIAGNOSIS — R778 Other specified abnormalities of plasma proteins: Secondary | ICD-10-CM

## 2020-12-27 DIAGNOSIS — E1069 Type 1 diabetes mellitus with other specified complication: Secondary | ICD-10-CM

## 2020-12-27 DIAGNOSIS — F17210 Nicotine dependence, cigarettes, uncomplicated: Secondary | ICD-10-CM | POA: Diagnosis present

## 2020-12-27 DIAGNOSIS — F10239 Alcohol dependence with withdrawal, unspecified: Secondary | ICD-10-CM | POA: Diagnosis not present

## 2020-12-27 DIAGNOSIS — R112 Nausea with vomiting, unspecified: Secondary | ICD-10-CM | POA: Diagnosis not present

## 2020-12-27 DIAGNOSIS — F10939 Alcohol use, unspecified with withdrawal, unspecified: Secondary | ICD-10-CM

## 2020-12-27 DIAGNOSIS — I480 Paroxysmal atrial fibrillation: Secondary | ICD-10-CM

## 2020-12-27 DIAGNOSIS — E785 Hyperlipidemia, unspecified: Secondary | ICD-10-CM | POA: Diagnosis present

## 2020-12-27 DIAGNOSIS — I4891 Unspecified atrial fibrillation: Secondary | ICD-10-CM

## 2020-12-27 DIAGNOSIS — K219 Gastro-esophageal reflux disease without esophagitis: Secondary | ICD-10-CM | POA: Diagnosis present

## 2020-12-27 DIAGNOSIS — E871 Hypo-osmolality and hyponatremia: Secondary | ICD-10-CM | POA: Diagnosis present

## 2020-12-27 DIAGNOSIS — F1020 Alcohol dependence, uncomplicated: Secondary | ICD-10-CM

## 2020-12-27 DIAGNOSIS — I444 Left anterior fascicular block: Secondary | ICD-10-CM | POA: Diagnosis present

## 2020-12-27 DIAGNOSIS — E1021 Type 1 diabetes mellitus with diabetic nephropathy: Secondary | ICD-10-CM | POA: Diagnosis present

## 2020-12-27 DIAGNOSIS — Z981 Arthrodesis status: Secondary | ICD-10-CM | POA: Diagnosis not present

## 2020-12-27 DIAGNOSIS — N2581 Secondary hyperparathyroidism of renal origin: Secondary | ICD-10-CM | POA: Diagnosis present

## 2020-12-27 DIAGNOSIS — R197 Diarrhea, unspecified: Secondary | ICD-10-CM

## 2020-12-27 HISTORY — DX: Unspecified atrial fibrillation: I48.91

## 2020-12-27 LAB — MAGNESIUM
Magnesium: 1.1 mg/dL — ABNORMAL LOW (ref 1.7–2.4)
Magnesium: 1.7 mg/dL (ref 1.7–2.4)

## 2020-12-27 LAB — CBC
HCT: 39.5 % (ref 39.0–52.0)
Hemoglobin: 14.1 g/dL (ref 13.0–17.0)
MCH: 34.6 pg — ABNORMAL HIGH (ref 26.0–34.0)
MCHC: 35.7 g/dL (ref 30.0–36.0)
MCV: 97.1 fL (ref 80.0–100.0)
Platelets: 204 10*3/uL (ref 150–400)
RBC: 4.07 MIL/uL — ABNORMAL LOW (ref 4.22–5.81)
RDW: 14.9 % (ref 11.5–15.5)
WBC: 12.1 10*3/uL — ABNORMAL HIGH (ref 4.0–10.5)
nRBC: 0.2 % (ref 0.0–0.2)

## 2020-12-27 LAB — GLUCOSE, CAPILLARY
Glucose-Capillary: 133 mg/dL — ABNORMAL HIGH (ref 70–99)
Glucose-Capillary: 146 mg/dL — ABNORMAL HIGH (ref 70–99)
Glucose-Capillary: 164 mg/dL — ABNORMAL HIGH (ref 70–99)
Glucose-Capillary: 271 mg/dL — ABNORMAL HIGH (ref 70–99)
Glucose-Capillary: 419 mg/dL — ABNORMAL HIGH (ref 70–99)
Glucose-Capillary: 273 mg/dL — ABNORMAL HIGH (ref 70–99)
Glucose-Capillary: 261 mg/dL — ABNORMAL HIGH (ref 70–99)

## 2020-12-27 LAB — PHOSPHORUS: Phosphorus: 2.5 mg/dL (ref 2.5–4.6)

## 2020-12-27 LAB — COMPREHENSIVE METABOLIC PANEL
ALT: 35 U/L (ref 0–44)
AST: 91 U/L — ABNORMAL HIGH (ref 15–41)
Albumin: 2.8 g/dL — ABNORMAL LOW (ref 3.5–5.0)
Alkaline Phosphatase: 204 U/L — ABNORMAL HIGH (ref 38–126)
Anion gap: 18 — ABNORMAL HIGH (ref 5–15)
BUN: 8 mg/dL (ref 6–20)
CO2: 29 mmol/L (ref 22–32)
Calcium: 6.3 mg/dL — CL (ref 8.9–10.3)
Chloride: 95 mmol/L — ABNORMAL LOW (ref 98–111)
Creatinine, Ser: 0.73 mg/dL (ref 0.61–1.24)
GFR, Estimated: >60 mL/min (ref >60)
Glucose, Bld: 93 mg/dL (ref 70–99)
Potassium: 3.1 mmol/L — ABNORMAL LOW (ref 3.5–5.1)
Sodium: 142 mmol/L (ref 135–145)
Total Bilirubin: 1.2 mg/dL (ref 0.3–1.2)
Total Protein: 5.9 g/dL — ABNORMAL LOW (ref 6.5–8.1)

## 2020-12-27 LAB — BASIC METABOLIC PANEL
Anion gap: 17 — ABNORMAL HIGH (ref 5–15)
BUN: 7 mg/dL (ref 6–20)
CO2: 30 mmol/L (ref 22–32)
Calcium: 6.8 mg/dL — ABNORMAL LOW (ref 8.9–10.3)
Chloride: 93 mmol/L — ABNORMAL LOW (ref 98–111)
Creatinine, Ser: 0.8 mg/dL (ref 0.61–1.24)
GFR, Estimated: >60 mL/min (ref >60)
Glucose, Bld: 118 mg/dL — ABNORMAL HIGH (ref 70–99)
Potassium: 2.9 mmol/L — ABNORMAL LOW (ref 3.5–5.1)
Sodium: 140 mmol/L (ref 135–145)

## 2020-12-27 LAB — CBG MONITORING, ED: Glucose-Capillary: 283 mg/dL — ABNORMAL HIGH (ref 70–99)

## 2020-12-27 LAB — HEMOGLOBIN A1C
Hgb A1c MFr Bld: 7.4 % — ABNORMAL HIGH (ref 4.8–5.6)
Mean Plasma Glucose: 165.68 mg/dL

## 2020-12-27 LAB — RESP PANEL BY RT-PCR (FLU A&B, COVID) ARPGX2
Influenza A by PCR: NEGATIVE
Influenza B by PCR: NEGATIVE
SARS Coronavirus 2 by RT PCR: NEGATIVE

## 2020-12-27 LAB — TROPONIN I (HIGH SENSITIVITY)
Troponin I (High Sensitivity): 30 ng/L — ABNORMAL HIGH (ref <18)
Troponin I (High Sensitivity): 31 ng/L — ABNORMAL HIGH (ref <18)

## 2020-12-27 LAB — HIV ANTIBODY (ROUTINE TESTING W REFLEX): HIV Screen 4th Generation wRfx: NONREACTIVE

## 2020-12-27 MED ORDER — INSULIN NPH (HUMAN) (ISOPHANE) 100 UNIT/ML ~~LOC~~ SUSP
5.0000 [IU] | Freq: Two times a day (BID) | SUBCUTANEOUS | Status: DC
  Filled 2020-12-27: qty 10

## 2020-12-27 MED ORDER — MAGNESIUM SULFATE 2 GM/50ML IV SOLN
2.0000 g | Freq: Once | INTRAVENOUS | Status: AC
  Administered 2020-12-27: 2 g via INTRAVENOUS
  Filled 2020-12-27: qty 50

## 2020-12-27 MED ORDER — INSULIN ASPART 100 UNIT/ML ~~LOC~~ SOLN: 0.0000 [IU] | Freq: Three times a day (TID) | SUBCUTANEOUS | Status: DC

## 2020-12-27 MED ORDER — POTASSIUM CHLORIDE 10 MEQ/100ML IV SOLN
10.0000 meq | INTRAVENOUS | Status: AC
  Administered 2020-12-27 (×2): 10 meq via INTRAVENOUS
  Filled 2020-12-27 (×4): qty 100

## 2020-12-27 MED ORDER — DILTIAZEM HCL-DEXTROSE 125-5 MG/125ML-% IV SOLN (PREMIX)
5.0000 mg/h | INTRAVENOUS | Status: DC
  Administered 2020-12-27: 5 mg/h via INTRAVENOUS
  Administered 2020-12-27: 10 mg/h via INTRAVENOUS
  Administered 2020-12-27: 15 mg/h via INTRAVENOUS
  Filled 2020-12-27 (×2): qty 125

## 2020-12-27 MED ORDER — DILTIAZEM HCL ER COATED BEADS 180 MG PO CP24
180.0000 mg | ORAL_CAPSULE | Freq: Once | ORAL | Status: AC
  Administered 2020-12-27: 180 mg via ORAL
  Filled 2020-12-27: qty 1

## 2020-12-27 MED ORDER — THIAMINE HCL 100 MG/ML IJ SOLN: 100.0000 mg | Freq: Every day | INTRAMUSCULAR | Status: DC

## 2020-12-27 MED ORDER — ADULT MULTIVITAMIN W/MINERALS CH
1.0000 | ORAL_TABLET | Freq: Every day | ORAL | Status: DC
  Administered 2020-12-27 – 2020-12-29 (×3): 1 via ORAL
  Filled 2020-12-27 (×3): qty 1

## 2020-12-27 MED ORDER — INSULIN ASPART 100 UNIT/ML ~~LOC~~ SOLN
0.0000 [IU] | Freq: Three times a day (TID) | SUBCUTANEOUS | Status: DC
  Administered 2020-12-27: 9 [IU] via SUBCUTANEOUS
  Administered 2020-12-28: 4 [IU] via SUBCUTANEOUS
  Administered 2020-12-28: 7 [IU] via SUBCUTANEOUS
  Administered 2020-12-29: 2 [IU] via SUBCUTANEOUS
  Filled 2020-12-27 (×2): qty 1

## 2020-12-27 MED ORDER — DILTIAZEM HCL-DEXTROSE 125-5 MG/125ML-% IV SOLN (PREMIX)
5.0000 mg/h | INTRAVENOUS | Status: DC
  Administered 2020-12-27: 5 mg/h via INTRAVENOUS
  Filled 2020-12-27: qty 125

## 2020-12-27 MED ORDER — POTASSIUM CHLORIDE 10 MEQ/100ML IV SOLN
10.0000 meq | Freq: Once | INTRAVENOUS | Status: AC
  Administered 2020-12-27: 10 meq via INTRAVENOUS
  Filled 2020-12-27: qty 100

## 2020-12-27 MED ORDER — AMIODARONE LOAD VIA INFUSION
150.0000 mg | Freq: Once | INTRAVENOUS | Status: AC
  Administered 2020-12-27: 150 mg via INTRAVENOUS
  Filled 2020-12-27: qty 83.34

## 2020-12-27 MED ORDER — ACETAMINOPHEN 325 MG PO TABS: 650.0000 mg | ORAL_TABLET | ORAL | Status: DC | PRN

## 2020-12-27 MED ORDER — POTASSIUM CHLORIDE 10 MEQ/100ML IV SOLN
10.0000 meq | INTRAVENOUS | Status: DC
  Administered 2020-12-27 (×2): 10 meq via INTRAVENOUS
  Filled 2020-12-27 (×6): qty 100

## 2020-12-27 MED ORDER — INSULIN ASPART 100 UNIT/ML ~~LOC~~ SOLN
0.0000 [IU] | SUBCUTANEOUS | Status: DC
  Administered 2020-12-27: 2 [IU] via SUBCUTANEOUS
  Administered 2020-12-27: 3 [IU] via SUBCUTANEOUS
  Administered 2020-12-27: 8 [IU] via SUBCUTANEOUS
  Filled 2020-12-27 (×3): qty 1

## 2020-12-27 MED ORDER — LORAZEPAM 2 MG/ML IJ SOLN: 0.0000 mg | Freq: Four times a day (QID) | INTRAMUSCULAR | Status: AC

## 2020-12-27 MED ORDER — AMIODARONE HCL IN DEXTROSE 360-4.14 MG/200ML-% IV SOLN
60.0000 mg/h | INTRAVENOUS | Status: AC
  Administered 2020-12-27 (×2): 60 mg/h via INTRAVENOUS
  Filled 2020-12-27 (×2): qty 200

## 2020-12-27 MED ORDER — LORAZEPAM 2 MG/ML IJ SOLN
0.0000 mg | Freq: Two times a day (BID) | INTRAMUSCULAR | Status: DC
  Filled 2020-12-27: qty 1

## 2020-12-27 MED ORDER — LORAZEPAM 2 MG/ML IJ SOLN
1.0000 mg | INTRAMUSCULAR | Status: DC | PRN
Start: 2020-12-27 — End: 2020-12-29
  Administered 2020-12-27: 2 mg via INTRAVENOUS
  Administered 2020-12-28 – 2020-12-29 (×2): 1 mg via INTRAVENOUS
  Filled 2020-12-27 (×2): qty 1

## 2020-12-27 MED ORDER — INSULIN DETEMIR 100 UNIT/ML ~~LOC~~ SOLN
8.0000 [IU] | Freq: Every day | SUBCUTANEOUS | Status: DC
  Administered 2020-12-27 – 2020-12-28 (×2): 8 [IU] via SUBCUTANEOUS
  Filled 2020-12-27 (×3): qty 0.08

## 2020-12-27 MED ORDER — ENOXAPARIN SODIUM 100 MG/ML ~~LOC~~ SOLN
1.0000 mg/kg | Freq: Two times a day (BID) | SUBCUTANEOUS | Status: DC
  Administered 2020-12-27 – 2020-12-28 (×2): 95 mg via SUBCUTANEOUS
  Filled 2020-12-27 (×5): qty 1

## 2020-12-27 MED ORDER — THIAMINE HCL 100 MG PO TABS
100.0000 mg | ORAL_TABLET | Freq: Every day | ORAL | Status: DC
  Administered 2020-12-27 – 2020-12-29 (×3): 100 mg via ORAL
  Filled 2020-12-27 (×3): qty 1

## 2020-12-27 MED ORDER — INSULIN ASPART 100 UNIT/ML ~~LOC~~ SOLN
0.0000 [IU] | Freq: Every day | SUBCUTANEOUS | Status: DC
  Administered 2020-12-27 – 2020-12-28 (×2): 3 [IU] via SUBCUTANEOUS
  Filled 2020-12-27 (×2): qty 1

## 2020-12-27 MED ORDER — AMIODARONE HCL IN DEXTROSE 360-4.14 MG/200ML-% IV SOLN
30.0000 mg/h | INTRAVENOUS | Status: DC
  Administered 2020-12-27 – 2020-12-28 (×3): 30 mg/h via INTRAVENOUS
  Filled 2020-12-27 (×2): qty 200

## 2020-12-27 MED ORDER — FOLIC ACID 1 MG PO TABS
1.0000 mg | ORAL_TABLET | Freq: Every day | ORAL | Status: DC
  Administered 2020-12-27 – 2020-12-29 (×3): 1 mg via ORAL
  Filled 2020-12-27 (×3): qty 1

## 2020-12-27 MED ORDER — LORAZEPAM 1 MG PO TABS
1.0000 mg | ORAL_TABLET | ORAL | Status: DC | PRN
Start: 2020-12-27 — End: 2020-12-29

## 2020-12-27 MED ORDER — CALCIUM GLUCONATE-NACL 1-0.675 GM/50ML-% IV SOLN
1.0000 g | Freq: Once | INTRAVENOUS | Status: AC
  Administered 2020-12-27: 1000 mg via INTRAVENOUS
  Filled 2020-12-27: qty 50

## 2020-12-27 MED ORDER — DILTIAZEM HCL 25 MG/5ML IV SOLN
25.0000 mg | Freq: Once | INTRAVENOUS | Status: AC
  Administered 2020-12-27: 25 mg via INTRAVENOUS
  Filled 2020-12-27: qty 5

## 2020-12-27 MED ORDER — POTASSIUM CHLORIDE CRYS ER 20 MEQ PO TBCR
40.0000 meq | EXTENDED_RELEASE_TABLET | Freq: Once | ORAL | Status: AC
  Administered 2020-12-27: 40 meq via ORAL
  Filled 2020-12-27: qty 2

## 2020-12-27 MED ORDER — ONDANSETRON HCL 4 MG/2ML IJ SOLN
4.0000 mg | Freq: Four times a day (QID) | INTRAMUSCULAR | Status: DC | PRN
  Administered 2020-12-27: 4 mg via INTRAVENOUS
  Filled 2020-12-27: qty 2

## 2020-12-27 MED ORDER — DILTIAZEM HCL ER COATED BEADS 120 MG PO CP24
240.0000 mg | ORAL_CAPSULE | Freq: Every day | ORAL | Status: DC
  Administered 2020-12-27 – 2020-12-29 (×3): 240 mg via ORAL
  Filled 2020-12-27 (×3): qty 2

## 2020-12-27 MED ORDER — POTASSIUM CHLORIDE 20 MEQ PO PACK
20.0000 meq | PACK | Freq: Two times a day (BID) | ORAL | Status: DC
  Administered 2020-12-27 (×2): 20 meq via ORAL
  Filled 2020-12-27 (×2): qty 1

## 2020-12-27 MED ORDER — SODIUM CHLORIDE 0.9 % IV SOLN: INTRAVENOUS | Status: DC

## 2020-12-27 NOTE — ED Provider Notes (Signed)
[facility] Emergency Department Provider Note  ____________________________________________  Time seen: Approximately 1:25 AM  I have reviewed the triage vital signs and the nursing notes.   HISTORY  Chief Complaint Emesis   HPI Russell Gomez is a 60 y.o. male with a history of diabetes, alcohol abuse, smoking, hypertension, hyperlipidemia who presents for evaluation of nausea and vomiting.  Patient reports that he has felt unwell for the last 2 to 3 days.  Has had more than 10 episodes of nonbloody nonbilious emesis.  Has had some mild diarrhea.  No hematochezia, no hematemesis, no melena.  Has been having intermittent chest tightness associated with shortness of breath.  No cough, no fever, no sore throat.  Patient denies any personal or family history of PE or DVT, recent travel immobilization, leg pain or swelling.   Has not been vaccinated for Covid.  No known exposures.  Past Medical History:  Diagnosis Date  . Arthritis    on operative finger  . Dental crowns present   . Diabetes type 1, uncontrolled (HCC)    IDDM (Dr. Eddie Dibbles @ [facility])  . Esophageal reflux   . Goiter 04/2012   no current med, has yearly monitoring  . High cholesterol   . HTN (hypertension)    under control with meds, has been on med x 2 yr.    Patient Active Problem List   Diagnosis Date Noted  . Hypocalcemia 12/27/2020  . Hypomagnesemia 12/27/2020  . Alcohol use disorder, moderate, dependence ([place]) 12/27/2020  . Acute gastroenteritis 12/27/2020  . Rapid atrial fibrillation, new onset(HCC) 12/27/2020  . Dizziness 12/10/2020  . Irregular heart rhythm 12/10/2020  . AAA (abdominal aortic aneurysm) ([place]) 05/27/2020  . Transaminitis 05/22/2020  . Thiamine deficiency 04/28/2020  . Acute diarrhea 04/25/2020  . Alcohol abuse 04/25/2020  . Macrocytic anemia 04/19/2020  . Abnormal EKG 04/19/2020  . Proteinuria 04/19/2020  . Vitamin B12 deficiency 04/19/2020  . Vitamin D deficiency  04/19/2020  . Type 1 diabetes mellitus with diabetic nephropathy ([place]) 04/19/2020  . Folate deficiency 04/19/2020  . Hyperparathyroidism ([place]) 04/19/2020  . Psychosocial stressors 04/27/2019  . Pedal edema 04/08/2017  . Abnormal thyroid function test 02/13/2017  . Encounter for general adult medical examination with abnormal findings 01/23/2014  . Cough 01/09/2014  . Colloid thyroid nodule 05/05/2012  . Erectile dysfunction 04/19/2012  . Diabetes mellitus type 1, controlled ([place]) 10/22/2010  . Hyperlipidemia due to type 1 diabetes mellitus ([place]) 10/22/2010  . TOBACCO ABUSE 10/22/2010  . Essential hypertension 10/22/2010  . GERD 10/22/2010    Past Surgical History:  Procedure Laterality Date  . DISTAL INTERPHALANGEAL JOINT FUSION Right 03/05/2014   Procedure: DEBRIDEMENT (DIP) DISTAL INTERPHALANGEAL RIGHT MIDDLE FINGER;  Surgeon: Wynonia Sours, MD;  Location: [place];  Service: Orthopedics;  Laterality: Right;  . KNEE ARTHROSCOPY    . MASS EXCISION Right 03/05/2014   Procedure: EXCISION CYST ;  Surgeon: Wynonia Sours, MD;  Location: [place];  Service: Orthopedics;  Laterality: Right;  ANESTHESIA: IV REGINAL FAB  . OPEN REDUCTION NASAL FRACTURE  12/26/2008   with closure of nasal lac.  Marland Kitchen ORIF DISTAL RADIUS FRACTURE Right 12/26/2008  . ORIF WRIST FRACTURE Left 05/12/2016   takedown of nonunion/malunion and OPEN REDUCTION INTERNAL FIXATION (ORIF) WRIST FRACTURE;  Surgeon: Corky Mull, MD  . PERCUTANEOUS PINNING Left 02/06/2013   Procedure: PINNING PIP OF THE LEFT MIDDLE FINGER ;  Surgeon: Wynonia Sours, MD;  Location: [facility];  Service: Orthopedics;  Laterality: Left;  . SKIN GRAFT SPLIT THICKNESS LEG / FOOT Right 1985   thigh after trauma vs machine at work  . TRIGGER FINGER RELEASE  11/28/2012   Procedure: RELEASE TRIGGER FINGER/A-1 PULLEY;  Surgeon: Wynonia Sours, MD;  Laterality: Left;  EXCISION MASS LEFT RING FINGER, RELEASE A-1 PULLEY  LEFT RING FINGER (ganglion cyst)    Prior to Admission medications   Medication Sig Start Date End Date Taking? Authorizing Provider  aspirin 81 MG EC tablet Take 1 tablet (81 mg total) by mouth every Monday, Wednesday, and Friday. Swallow whole. 04/05/18   Ria Bush, MD  Blood Glucose Monitoring Suppl (ACCU-CHEK GUIDE) w/Device KIT See admin instructions. 02/09/19   [provider]  Cholecalciferol (VITAMIN D3) 1.25 MG (50000 UT) TABS Take 1 tablet by mouth once a week. 12/15/20   Ria Bush, MD  Coral Calcium (SM CORAL CALCIUM) 1000 (390 Ca) MG TABS Take 1 tablet by mouth daily. 05/13/20   Ria Bush, MD  Cyanocobalamin (B-12) 1000 MCG SUBL Place 1 tablet under the tongue daily. 04/19/20   Ria Bush, MD  enalapril (VASOTEC) 20 MG tablet Take 1 tablet (20 mg total) by mouth at bedtime. 12/10/20   Ria Bush, MD  folic acid (FOLVITE) 1 MG tablet Take 1 tablet (1 mg total) by mouth daily. 04/19/20   Ria Bush, MD  furosemide (LASIX) 40 MG tablet Take 1 tablet (40 mg total) by mouth daily. 09/03/20   Ria Bush, MD  glucose blood test strip Use 6 (six) times daily 01/16/18   [provider]  guaiFENesin-codeine (ROBITUSSIN AC) 100-10 MG/5ML syrup Take 5 mLs by mouth 2 (two) times daily as needed for cough (sedation precautions). 10/13/20   Ria Bush, MD  hydrOXYzine (ATARAX/VISTARIL) 25 MG tablet Take 0.5-1 tablets (12.5-25 mg total) by mouth 2 (two) times daily as needed for anxiety. 04/25/20   Ria Bush, MD  insulin lispro (HUMALOG) 100 UNIT/ML injection 5-15 units before each meal based on carb intake and sugar 10/11/19   [provider]  Insulin NPH, Human,, Isophane, (HUMULIN N KWIKPEN) 100 UNIT/ML Kiwkpen Inject 9 Units into the skin 2 (two) times daily. 12/03/19   Ria Bush, MD  Magnesium 500 MG CAPS Take 1 capsule (500 mg total) by mouth in the morning and at bedtime. 12/15/20   Ria Bush, MD   metoprolol tartrate (LOPRESSOR) 50 MG tablet Take 1 tablet by mouth twice daily 10/08/20   Ria Bush, MD  omeprazole (PRILOSEC) 40 MG capsule Take 1 capsule by mouth twice daily 07/07/20   Ria Bush, MD  pantoprazole (PROTONIX) 40 MG tablet Take 1 tablet (40 mg total) by mouth 2 (two) times daily before a meal. 12/15/20   Ria Bush, MD  potassium chloride SA (KLOR-CON) 20 MEQ tablet Take 1 tablet by mouth once daily 12/25/20   Ria Bush, MD  pravastatin (PRAVACHOL) 80 MG tablet Take 1 tablet by mouth once daily 07/21/20   Ria Bush, MD  sildenafil (VIAGRA) 100 MG tablet Take 1 tablet (100 mg total) by mouth daily as needed for erectile dysfunction. 07/24/20   Ria Bush, MD  thiamine 100 MG tablet Take 1 tablet (100 mg total) by mouth daily. 04/20/20   Ria Bush, MD    Allergies Insulin aspart, Spironolactone, Hydrochlorothiazide, and Morphine  Family History  Problem Relation Age of Onset  . Healthy Father   . Hypertension Mother   . Hyperlipidemia Mother   . Diabetes Maternal Uncle   .  Coronary artery disease Maternal Uncle   . Stroke Maternal Aunt   . Diabetes Paternal Grandmother   . Diabetes Paternal Uncle     Social History Social History   Tobacco Use  . Smoking status: Current Every Day Smoker    Packs/day: 1.00    Years: 20.00    Pack years: 20.00    Types: Cigarettes  . Smokeless tobacco: Never Used  . Tobacco comment: smokes 1 pack a day  Vaping Use  . Vaping Use: Former  Substance Use Topics  . Alcohol use: Yes    Alcohol/week: 7.0 standard drinks    Types: 7 Cans of beer per week    Comment: 1-2 drinks per day  . Drug use: No    Review of Systems  Constitutional: Negative for fever. Eyes: Negative for visual changes. ENT: Negative for sore throat. Neck: No neck pain  Cardiovascular: + chest pain. Respiratory: + shortness of breath. Gastrointestinal: Negative for abdominal pain. +vomiting and  diarrhea. Genitourinary: Negative for dysuria. Musculoskeletal: Negative for back pain. Skin: Negative for rash. Neurological: Negative for headaches, weakness or numbness. Psych: No SI or HI  ____________________________________________   PHYSICAL EXAM:  VITAL SIGNS: ED Triage Vitals  Enc Vitals Group     BP 12/26/20 2316 (!) 153/88     Pulse Rate 12/26/20 2259 (!) 163     Resp 12/27/20 0003 (!) 22     Temp 12/26/20 2259 98.5 F (36.9 C)     Temp Source 12/26/20 2259 Oral     SpO2 12/26/20 2259 94 %     Weight 12/26/20 2300 210 lb (95.3 kg)     Height 12/26/20 2300 6' 2"  (1.88 m)     Head Circumference --      Peak Flow --      Pain Score 12/26/20 2301 4     Pain Loc --      Pain Edu? --      Excl. in [place]? --     Constitutional: Alert and oriented, no apparent distress. HEENT:      Head: Normocephalic and atraumatic.         Eyes: Conjunctivae are normal. Sclera is non-icteric.       Mouth/Throat: Mucous membranes are moist.       Neck: Supple with no signs of meningismus. Cardiovascular: Irregularly irregular rhythm with tachycardic rate  respiratory: Normal respiratory effort. Lungs are clear to auscultation bilaterally. No wheezes, crackles, or rhonchi.  Gastrointestinal: Soft, non tender, and non distended with positive bowel sounds. No rebound or guarding. Genitourinary: No CVA tenderness. Musculoskeletal: No edema, cyanosis, or erythema of extremities. Neurologic: Normal speech and language. Face is symmetric. Moving all extremities. No gross focal neurologic deficits are appreciated. Skin: Skin is warm, dry and intact. No rash noted. Psychiatric: Mood and affect are normal. Speech and behavior are normal.  ____________________________________________   LABS (all labs ordered are listed, but only abnormal results are displayed)  Labs Reviewed  CBC WITH DIFFERENTIAL/PLATELET - Abnormal; Notable for the following components:      Result Value   WBC 10.9 (*)     MCH 34.5 (*)    nRBC 0.3 (*)    Neutro Abs 8.4 (*)    All other components within normal limits  COMPREHENSIVE METABOLIC PANEL - Abnormal; Notable for the following components:   Potassium 3.1 (*)    Chloride 95 (*)    Calcium 6.3 (*)    Total Protein 5.9 (*)    Albumin 2.8 (*)  AST 91 (*)    Alkaline Phosphatase 204 (*)    Anion gap 18 (*)    All other components within normal limits  MAGNESIUM - Abnormal; Notable for the following components:   Magnesium 1.1 (*)    All other components within normal limits  TROPONIN I (HIGH SENSITIVITY) - Abnormal; Notable for the following components:   Troponin I (High Sensitivity) 30 (*)    All other components within normal limits  RESP PANEL BY RT-PCR (FLU A&B, COVID) ARPGX2  CBG MONITORING, ED  TROPONIN I (HIGH SENSITIVITY)   ____________________________________________  EKG  ED ECG REPORT I, Rudene Re, the attending physician, personally viewed and interpreted this ECG.  A. fib with RVR, rate of 163, diffuse ST depressions with no ST elevation. ____________________________________________  RADIOLOGY  I have personally reviewed the images performed during this visit and I agree with the Radiologist's read.   Interpretation by Radiologist:  DG Chest Portable 1 View  Result Date: 12/26/2020 CLINICAL DATA:  Nausea and vomiting x3 days. EXAM: PORTABLE CHEST 1 VIEW COMPARISON:  April 16, 2020 FINDINGS: The heart size and mediastinal contours are within normal limits. There is mild calcification of the aortic arch. Both lungs are clear. The visualized skeletal structures are unremarkable. IMPRESSION: No active disease. Electronically Signed   By: Virgina Norfolk M.D.   On: 12/26/2020 23:33     ____________________________________________   PROCEDURES  Procedure(s) performed:yes .1-3 Lead EKG Interpretation Performed by: Rudene Re, MD Authorized by: Rudene Re, MD     Interpretation: abnormal      ECG rate assessment: tachycardic     Rhythm: atrial fibrillation     Critical Care performed: yes  CRITICAL CARE Performed by: Rudene Re  ?  Total critical care time: 40 min  Critical care time was exclusive of separately billable procedures and treating other patients.  Critical care was necessary to treat or prevent imminent or life-threatening deterioration.  Critical care was time spent personally by me on the following activities: development of treatment plan with patient and/or surrogate as well as nursing, discussions with consultants, evaluation of patient's response to treatment, examination of patient, obtaining history from patient or surrogate, ordering and performing treatments and interventions, ordering and review of laboratory studies, ordering and review of radiographic studies, pulse oximetry and re-evaluation of patient's condition.  ____________________________________________   INITIAL IMPRESSION / ASSESSMENT AND PLAN / ED COURSE  60 y.o. male with a history of diabetes, alcohol abuse, smoking, hypertension, hyperlipidemia who presents for evaluation of nausea, vomiting, diarrhea, malaise, CP, and SOB x 2-3 days.  Patient arrives in A. fib with RVR.  According to him he does not have a history of such but review of epic shows that patient has had prior EKG showing A. fib.  He does have a history of alcohol abuse and today he has several electrolyte abnormalities including hypomagnesemia, hypokalemia, hypocalcemia.  All of these electrolytes have been supplemented p.o. and IV.  Patient also received a liter of fluid for concerns for dehydration in the setting of nausea and vomiting.  He received a dose of IV Cardizem bolus with no significant improvement of his rate and he was started on a Cardizem drip.  His initial troponin is elevated at 30 concerning for demand ischemia in the setting of A. fib especially with diffuse ST depression seen on his EKG.  Covid and  flu swab are pending as possible etiologies are patient's symptoms.  Chest x-ray visualized by me with no signs of  pulmonary edema or multifocal pneumonia, confirmed by radiology.  Patient was discussed with the hospitalist service for admission.       _____________________________________________ Please note:  Patient was evaluated in Emergency Department today for the symptoms described in the history of present illness. Patient was evaluated in the context of the global COVID-19 pandemic, which necessitated consideration that the patient might be at risk for infection with the SARS-CoV-2 virus that causes COVID-19. Institutional protocols and algorithms that pertain to the evaluation of patients at risk for COVID-19 are in a state of rapid change based on information released by regulatory bodies including the CDC and federal and state organizations. These policies and algorithms were followed during the patient's care in the ED.  Some ED evaluations and interventions may be delayed as a result of limited staffing during the pandemic.   [place] Controlled Substance Database was reviewed by me. ____________________________________________   FINAL CLINICAL IMPRESSION(S) / ED DIAGNOSES   Final diagnoses:  Atrial fibrillation with RVR ([place])  Hypomagnesemia  Hypokalemia  Hypocalcemia  Demand ischemia ([place])      NEW MEDICATIONS STARTED DURING THIS VISIT:  ED Discharge Orders    None       Note:  This document was prepared using Dragon voice recognition software and may include unintentional dictation errors.    Alfred Levins, [place], MD 12/27/20 [phone]

## 2020-12-27 NOTE — ED Notes (Addendum)
Critical Lab  Calcium 6.3   MD Alfred Levins notified in person

## 2020-12-27 NOTE — Progress Notes (Signed)
Initial Nutrition Assessment  RD working remotely.  DOCUMENTATION CODES:   Not applicable  INTERVENTION:  - diet advancement as medically feasible. - will complete NFPE when feasible.   NUTRITION DIAGNOSIS:   Inadequate oral intake related to acute illness,nausea,vomiting as evidenced by per patient/family report,NPO status.  GOAL:   Patient will meet greater than or equal to 90% of their needs  MONITOR:   Diet advancement,Labs,Weight trends,I & O's  REASON FOR ASSESSMENT:   Malnutrition Screening Tool  ASSESSMENT:   60 y.o. male with medical history of type 1 DM, HTN, and alcohol use disorder. He presented to the ED with 2-day hx of vomiting and diarrhea. He reported vomiting nearly hourly during that time and that loose stools occur, on average, twice/day. He denied any abdominal pain or cramping.  RD working off site. Unable to reach patient by phone. He has not been seen by a [place] RD at any time in the past.   He has been NPO since admission. MST screen report indicates that patient reported losing "70 lb in the past year because of separation".   Weight yesterday was documented as 210 lb and appears to be a stated weight. The most recent weight PTA was on 12/10/20 at which time he weighed 219 lb. This would indicate 9 lb weight loss (4% body weight) in 2 weeks.  Weight on 12/03/19 was 248 lb. This would indicate 38 lb weight loss (15% body weight) in the past ~13 months; not significant for time frame.  Per notes: - new onset rapid afib--Cardiology consulted - acute gastroenteritis--NPO except sips with plan to advance as tolerated - hypomagnesemia and hypokalemia thought to be, at least in part, 2/2 alcohol abuse - controlled type 1 DM   Labs reviewed; CBGs: 133 and 146 mg/dl, K: 2.9 mmol/l, Cl: 93 mmol/l, Ca: 6.8 mg/dl.  Medications reviewed; 1 g Ca glucagon x1 run 2/19, 20 mg IV pepcid x1 dose 2/18, 1 mg oral folvite/day, sliding scale novolog, 2 g IV Mg  sulfate x1 run 2/18 and x2 runs 2/19, 1 tablet multivitamin with minerals/day, 10 mEq IV KCl x7 runs 2/19, 40 mEq Klor-Con x1 dose 2/19, 100 mg thiamine/day.    NUTRITION - FOCUSED PHYSICAL EXAM:  unable to complete at this time.   Diet Order:   Diet Order            Diet NPO time specified Except for: Sips with Meds  Diet effective now                 EDUCATION NEEDS:   Not appropriate for education at this time  Skin:  Skin Assessment: Reviewed RN Assessment  Last BM:  PTA/unknown  Height:   Ht Readings from Last 1 Encounters:  12/26/20 6\' 2"  (1.88 m)    Weight:   Wt Readings from Last 1 Encounters:  12/26/20 95.3 kg    Estimated Nutritional Needs:  Kcal:  2300-2500 kcal Protein:  115-125 grams Fluid:  >/= 3 L/day      Jarome Matin, MS, RD, LDN, CNSC Inpatient Clinical Dietitian RD pager # available in [place]  After hours/weekend pager # available in [facility]

## 2020-12-27 NOTE — ED Notes (Signed)
Pt is vomiting yellow emesis at this time
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
## 2020-12-27 NOTE — H&P (Signed)
History and Physical    Russell Gomez EAV:[id number] DOB: 11/17/1960 DOA: 12/26/2020  PCP: Ria Bush, MD   Patient coming from: Home  I have personally briefly reviewed patient's old medical records in [place]  Chief Complaint: Vomiting and diarrhea x2 days  HPI: Russell Gomez is a 60 y.o. male with medical history significant for type 1 diabetes, hypertension, alcohol use disorder who presents with a 2-day history of vomiting and diarrhea.  States for the past 2 days he has been vomiting every almost every hour, clear stomach contents, nonbloody nonbilious and no coffee grounds.  He also has been having loose stools about twice a day, brown in color no blood.  He denies abdominal pain, burning or cramping.  Denies fever or chills.  Unvaccinated against COVID.  He has no affected contacts and did not eat anything out of the ordinary  ED Course: Upon arrival he was found to be in rapid A. fib with heart rates in the 160s.Otherwise afebrile, BP 153/88, respirations 23 with O2 sat 90% on room air.  Blood work significant for several electrolyte abnormalities including calcium of 6.3, magnesium 1.1 and potassium 3.1.  Troponin was elevated at 30>31.  WBC 10,900.  AST 91 with alk phos 204.  Anion gap 18 with normal bicarb. EKG as reviewed by me : Rapid A. fib with rate of 163 Imaging: Chest x-ray with no active disease  Patient was given an IV fluid bolus in the ER with no significant improvement in rate and was subsequently given a diltiazem bolus followed by infusion.  He was also administered magnesium and potassium infusions and calcium gluconate.  Hospitalist consulted for admission.  Review of Systems: As per HPI otherwise all other systems on review of systems negative.    Past Medical History:  Diagnosis Date  . Arthritis    on operative finger  . Dental crowns present   . Diabetes type 1, uncontrolled (HCC)    IDDM (Dr. Eddie Dibbles @ [facility])  . Esophageal reflux   . Goiter  04/2012   no current med, has yearly monitoring  . High cholesterol   . HTN (hypertension)    under control with meds, has been on med x 2 yr.    Past Surgical History:  Procedure Laterality Date  . DISTAL INTERPHALANGEAL JOINT FUSION Right 03/05/2014   Procedure: DEBRIDEMENT (DIP) DISTAL INTERPHALANGEAL RIGHT MIDDLE FINGER;  Surgeon: Wynonia Sours, MD;  Location: [place];  Service: Orthopedics;  Laterality: Right;  . KNEE ARTHROSCOPY    . MASS EXCISION Right 03/05/2014   Procedure: EXCISION CYST ;  Surgeon: Wynonia Sours, MD;  Location: [place];  Service: Orthopedics;  Laterality: Right;  ANESTHESIA: IV REGINAL FAB  . OPEN REDUCTION NASAL FRACTURE  12/26/2008   with closure of nasal lac.  Marland Kitchen ORIF DISTAL RADIUS FRACTURE Right 12/26/2008  . ORIF WRIST FRACTURE Left 05/12/2016   takedown of nonunion/malunion and OPEN REDUCTION INTERNAL FIXATION (ORIF) WRIST FRACTURE;  Surgeon: Corky Mull, MD  . PERCUTANEOUS PINNING Left 02/06/2013   Procedure: PINNING PIP OF THE LEFT MIDDLE FINGER ;  Surgeon: Wynonia Sours, MD;  Location: [place];  Service: Orthopedics;  Laterality: Left;  . SKIN GRAFT SPLIT THICKNESS LEG / FOOT Right 1985   thigh after trauma vs machine at work  . TRIGGER FINGER RELEASE  11/28/2012   Procedure: RELEASE TRIGGER FINGER/A-1 PULLEY;  Surgeon: Wynonia Sours, MD;  Laterality: Left;  EXCISION  MASS LEFT RING FINGER, RELEASE A-1 PULLEY LEFT RING FINGER (ganglion cyst)     reports that he has been smoking cigarettes. He has a 20.00 pack-year smoking history. He has never used smokeless tobacco. He reports current alcohol use of about 7.0 standard drinks of alcohol per week. He reports that he does not use drugs.  Allergies  Allergen Reactions  . Insulin Aspart Other (See Comments)    CELLULITIS  . Spironolactone Nausea Only and Rash  . Hydrochlorothiazide Itching and Rash  . Morphine Other (See Comments)    "MAKES ME MEAN"     Family History  Problem Relation Age of Onset  . Healthy Father   . Hypertension Mother   . Hyperlipidemia Mother   . Diabetes Maternal Uncle   . Coronary artery disease Maternal Uncle   . Stroke Maternal Aunt   . Diabetes Paternal Grandmother   . Diabetes Paternal Uncle       Prior to Admission medications   Medication Sig Start Date End Date Taking? Authorizing Provider  aspirin 81 MG EC tablet Take 1 tablet (81 mg total) by mouth every Monday, Wednesday, and Friday. Swallow whole. 04/05/18   Ria Bush, MD  Blood Glucose Monitoring Suppl (ACCU-CHEK GUIDE) w/Device KIT See admin instructions. 02/09/19   [provider]  Cholecalciferol (VITAMIN D3) 1.25 MG (50000 UT) TABS Take 1 tablet by mouth once a week. 12/15/20   Ria Bush, MD  Coral Calcium (SM CORAL CALCIUM) 1000 (390 Ca) MG TABS Take 1 tablet by mouth daily. 05/13/20   Ria Bush, MD  Cyanocobalamin (B-12) 1000 MCG SUBL Place 1 tablet under the tongue daily. 04/19/20   Ria Bush, MD  enalapril (VASOTEC) 20 MG tablet Take 1 tablet (20 mg total) by mouth at bedtime. 12/10/20   Ria Bush, MD  folic acid (FOLVITE) 1 MG tablet Take 1 tablet (1 mg total) by mouth daily. 04/19/20   Ria Bush, MD  furosemide (LASIX) 40 MG tablet Take 1 tablet (40 mg total) by mouth daily. 09/03/20   Ria Bush, MD  glucose blood test strip Use 6 (six) times daily 01/16/18   [provider]  guaiFENesin-codeine (ROBITUSSIN AC) 100-10 MG/5ML syrup Take 5 mLs by mouth 2 (two) times daily as needed for cough (sedation precautions). 10/13/20   Ria Bush, MD  hydrOXYzine (ATARAX/VISTARIL) 25 MG tablet Take 0.5-1 tablets (12.5-25 mg total) by mouth 2 (two) times daily as needed for anxiety. 04/25/20   Ria Bush, MD  insulin lispro (HUMALOG) 100 UNIT/ML injection 5-15 units before each meal based on carb intake and sugar 10/11/19   [provider]  Insulin NPH, Human,,  Isophane, (HUMULIN N KWIKPEN) 100 UNIT/ML Kiwkpen Inject 9 Units into the skin 2 (two) times daily. 12/03/19   Ria Bush, MD  Magnesium 500 MG CAPS Take 1 capsule (500 mg total) by mouth in the morning and at bedtime. 12/15/20   Ria Bush, MD  metoprolol tartrate (LOPRESSOR) 50 MG tablet Take 1 tablet by mouth twice daily 10/08/20   Ria Bush, MD  omeprazole (PRILOSEC) 40 MG capsule Take 1 capsule by mouth twice daily 07/07/20   Ria Bush, MD  pantoprazole (PROTONIX) 40 MG tablet Take 1 tablet (40 mg total) by mouth 2 (two) times daily before a meal. 12/15/20   Ria Bush, MD  potassium chloride SA (KLOR-CON) 20 MEQ tablet Take 1 tablet by mouth once daily 12/25/20   Ria Bush, MD  pravastatin (PRAVACHOL) 80 MG tablet Take 1 tablet by mouth  once daily 07/21/20   Ria Bush, MD  sildenafil (VIAGRA) 100 MG tablet Take 1 tablet (100 mg total) by mouth daily as needed for erectile dysfunction. 07/24/20   Ria Bush, MD  thiamine 100 MG tablet Take 1 tablet (100 mg total) by mouth daily. 04/20/20   Ria Bush, MD    Physical Exam: Vitals:   12/26/20 2316 12/27/20 0003 12/27/20 0006 12/27/20 0044  BP: (!) 153/88  (!) 163/90 (!) 144/80  Pulse:   (!) 132 (!) 144  Resp:  (!) 22 (!) 22 20  Temp:      TempSrc:      SpO2:   96% 93%  Weight:      Height:         Vitals:   12/26/20 2316 12/27/20 0003 12/27/20 0006 12/27/20 0044  BP: (!) 153/88  (!) 163/90 (!) 144/80  Pulse:   (!) 132 (!) 144  Resp:  (!) 22 (!) 22 20  Temp:      TempSrc:      SpO2:   96% 93%  Weight:      Height:          Constitutional: Alert and oriented x 3 .  Ill-appearing HEENT:      Head: Normocephalic and atraumatic.         Eyes: PERLA, EOMI, Conjunctivae are normal. Sclera is non-icteric.       Mouth/Throat: Mucous membranes are moist.       Neck: Supple with no signs of meningismus. Cardiovascular:  Irregularly irregular, rapid. No murmurs, gallops, or  rubs. 2+ symmetrical distal pulses are present . No JVD. No LE edema Respiratory: Respiratory effort normal .Lungs sounds clear bilaterally. No wheezes, crackles, or rhonchi.  Gastrointestinal: Soft, non tender, and non distended with positive bowel sounds.  Genitourinary: No CVA tenderness. Musculoskeletal: Nontender with normal range of motion in all extremities\. No cyanosis, or erythema of extremities. Neurologic:  Face is symmetric. Moving all extremities. No gross focal neurologic deficits \. Skin: Skin is warm, dry.  No rash or ulcers Psychiatric: Mood and affect are normal    Labs on Admission: I have personally reviewed following labs and imaging studies  CBC: Recent Labs  Lab 12/26/20 2257  WBC 10.9*  NEUTROABS 8.4*  HGB 15.0  HCT 42.5  MCV 97.7  PLT 364   Basic Metabolic Panel: Recent Labs  Lab 12/26/20 2257  NA 142  K 3.1*  CL 95*  CO2 29  GLUCOSE 93  BUN 8  CREATININE 0.73  CALCIUM 6.3*  MG 1.1*   GFR: Estimated Creatinine Clearance: 117 mL/min (by C-G formula based on SCr of 0.73 mg/dL). Liver Function Tests: Recent Labs  Lab 12/26/20 2257  AST 91*  ALT 35  ALKPHOS 204*  BILITOT 1.2  PROT 5.9*  ALBUMIN 2.8*   No results for input(s): LIPASE, AMYLASE in the last 168 hours. No results for input(s): AMMONIA in the last 168 hours. Coagulation Profile: No results for input(s): INR, PROTIME in the last 168 hours. Cardiac Enzymes: No results for input(s): CKTOTAL, CKMB, CKMBINDEX, TROPONINI in the last 168 hours. BNP (last 3 results) No results for input(s): PROBNP in the last 8760 hours. HbA1C: No results for input(s): HGBA1C in the last 72 hours. CBG: No results for input(s): GLUCAP in the last 168 hours. Lipid Profile: No results for input(s): CHOL, HDL, LDLCALC, TRIG, CHOLHDL, LDLDIRECT in the last 72 hours. Thyroid Function Tests: No results for input(s): TSH, T4TOTAL, FREET4, T3FREE, THYROIDAB in the  last 72 hours. Anemia Panel: No  results for input(s): VITAMINB12, FOLATE, FERRITIN, TIBC, IRON, RETICCTPCT in the last 72 hours. Urine analysis:    Component Value Date/Time   COLORURINE YELLOW (A) 03/31/2018 2332   APPEARANCEUR HAZY (A) 03/31/2018 2332   LABSPEC 1.015 03/31/2018 2332   PHURINE 5.0 03/31/2018 2332   GLUCOSEU NEGATIVE 03/31/2018 2332   HGBUR SMALL (A) 03/31/2018 2332   BILIRUBINUR 1+ 04/16/2020 1242   KETONESUR 5 (A) 03/31/2018 2332   PROTEINUR Positive (A) 04/16/2020 1242   PROTEINUR 30 (A) 03/31/2018 2332   UROBILINOGEN 1.0 04/16/2020 1242   UROBILINOGEN 0.2 01/14/2010 0100   NITRITE negative 04/16/2020 1242   NITRITE NEGATIVE 03/31/2018 2332   LEUKOCYTESUR Negative 04/16/2020 1242    Radiological Exams on Admission: DG Chest Portable 1 View  Result Date: 12/26/2020 CLINICAL DATA:  Nausea and vomiting x3 days. EXAM: PORTABLE CHEST 1 VIEW COMPARISON:  April 16, 2020 FINDINGS: The heart size and mediastinal contours are within normal limits. There is mild calcification of the aortic arch. Both lungs are clear. The visualized skeletal structures are unremarkable. IMPRESSION: No active disease. Electronically Signed   By: Virgina Norfolk M.D.   On: 12/26/2020 23:33     Assessment/Plan 60 year old male with history of type 1 diabetes, hypertension, alcohol use disorder who presents with a 2-day history of vomiting and diarrhea and generalized malaise.  In rapid A. fib on arrival with rate in the 160s    Rapid atrial fibrillation, new onset(HCC) -Patient presenting with A. fib in the 160s -Continue diltiazem infusion and titrate per protocol -Contributing factors include acute illness, ongoing alcohol use, electrolyte disturbance -CHA2DS2-VASc of 2 so will benefit from systemic anticoagulation for stroke prevention -Lovenox 1 mg/kg every 12 pending further discussion on choice of anticoagulants -Echocardiogram -Cardiology consult    Acute gastroenteritis -Patient with nonbloody, nonbilious  vomiting, no coffee grounds associated with loose stool -Covid returned negative -IV fluids -IV antiemetics, IV Protonix -N.p.o. except for sips and advance as tolerated  Hypocalcemia -Calcium 6.3 -Received calcium gluconate in the emergency room -Continue to monitor -Had a PTH intact in June 2021 that was elevated at 104     Hypomagnesemia -IV magnesium administered in the emergency room -Continue to monitor -Suspect related in part to chronic alcohol use  Hypokalemia -Replete and monitor -Related to GI losses from vomiting and diarrhea    Diabetes mellitus type 1, controlled (HCC) -Sliding scale coverage only given ongoing vomiting -Monitor A1c -Patient follows with endocrinology    Essential hypertension -We will hold home antihypertensives given diltiazem infusion and reintroduce as BP will tolerate    Alcohol use disorder, moderate, dependence (HCC) -CIWA withdrawal protocol    Elevated troponin -Troponin with mild elevation of 30 with flat trend to 31 -Likely demand ischemia related to rapid A. fib    DVT prophylaxis: Full dose Lovenox Code Status: full code  Family Communication:  none  Disposition Plan: Back to previous home environment Consults called: Cardiology Status:At the time of admission, it appears that the appropriate admission status for this patient is INPATIENT. This is judged to be reasonable and necessary in order to provide the required intensity of service to ensure the patient's safety given the presenting symptoms, physical exam findings, and initial radiographic and laboratory data in the context of their  Comorbid conditions.   Patient requires inpatient status due to high intensity of service, high risk for further deterioration and high frequency of surveillance required.   I certify that at the  point of admission it is my clinical judgment that the patient will require inpatient hospital care spanning beyond [place] MD [facility]     12/27/2020, 1:50 AM

## 2020-12-27 NOTE — Progress Notes (Signed)
*  PRELIMINARY RESULTS* Echocardiogram 2D Echocardiogram has been performed.  Russell Gomez 12/27/2020, 3:34 PM

## 2020-12-27 NOTE — Progress Notes (Signed)
Made dr. Leslye Peer aware patients potassium is 2.9 this am. MD placed orders to give iv magnesium and Iv potassium. Will continue to monitor

## 2020-12-27 NOTE — Progress Notes (Incomplete)
Patient ID: ROMELO Gomez, male   DOB: 1961-07-28, 60 y.o.   MRN: [id number] [facility] PROGRESS NOTE  [name] [id number]:[id number] DOB: [birth year] DOA: 12/26/2020 PCP: Ria Bush, MD  HPI/Subjective: Patient came in with nausea vomiting and diarrhea.  States he saw slight blood in the vomit.  Has not had a bowel movement or vomited since yesterday.  Also found to be in rapid atrial fibrillation.  Objective: Vitals:   12/27/20 1149 12/27/20 1535  BP:    Pulse: 99 96  Resp: (!) 24   Temp:    SpO2: 95% 96%    Intake/Output Summary (Last 24 hours) at 12/27/2020 1645 Last data filed at 12/27/2020 1441 Gross per 24 hour  Intake 943.7 ml  Output 0 ml  Net 943.7 ml   Filed Weights   12/26/20 2300  Weight: 95.3 kg    ROS: Review of Systems  Respiratory: Negative for shortness of breath.   Cardiovascular: Positive for palpitations. Negative for chest pain.  Gastrointestinal: Positive for abdominal pain, diarrhea, nausea and vomiting.   Exam: Physical Exam HENT:     Head: Normocephalic.     Mouth/Throat:     Pharynx: No oropharyngeal exudate.  Eyes:     General: Lids are normal.     Conjunctiva/sclera: Conjunctivae normal.     Pupils: Pupils are equal, round, and reactive to light.  Cardiovascular:     Rate and Rhythm: Tachycardia present. Rhythm irregularly irregular.     Heart sounds: Normal heart sounds, S1 normal and S2 normal.  Pulmonary:     Breath sounds: Normal breath sounds. No decreased breath sounds, wheezing, rhonchi or rales.  Abdominal:     Palpations: Abdomen is soft.     Tenderness: There is no abdominal tenderness.  Musculoskeletal:     Right lower leg: No swelling.     Left lower leg: No swelling.  Skin:    General: Skin is warm.     Findings: No rash.  Neurological:     Mental Status: He is alert and oriented to person, place, and time.       Data Reviewed: Basic Metabolic Panel: Recent Labs  Lab 12/26/20 2257 12/27/20 0521   NA 142 140  K 3.1* 2.9*  CL 95* 93*  CO2 29 30  GLUCOSE 93 118*  BUN 8 7  CREATININE 0.73 0.80  CALCIUM 6.3* 6.8*  MG 1.1* 1.7  PHOS  --  2.5   Liver Function Tests: Recent Labs  Lab 12/26/20 2257  AST 91*  ALT 35  ALKPHOS 204*  BILITOT 1.2  PROT 5.9*  ALBUMIN 2.8*   CBC: Recent Labs  Lab 12/26/20 2257 12/27/20 0521  WBC 10.9* 12.1*  NEUTROABS 8.4*  --   HGB 15.0 14.1  HCT 42.5 39.5  MCV 97.7 97.1  PLT 196 204    CBG: Recent Labs  Lab 12/27/20 0252 12/27/20 0508 12/27/20 0743 12/27/20 1130  GLUCAP 283* 133* 146* 164*    Recent Results (from the past 240 hour(s))  Resp Panel by RT-PCR (Flu A&B, Covid) Nasopharyngeal Swab     Status: None   Collection Time: 12/26/20 10:57 PM   Specimen: Nasopharyngeal Swab; Nasopharyngeal(NP) swabs in vial transport medium  Result Value Ref Range Status   SARS Coronavirus 2 by RT PCR NEGATIVE NEGATIVE Final    Comment: (NOTE) SARS-CoV-2 target nucleic acids are NOT DETECTED.  The SARS-CoV-2 RNA is generally detectable in upper respiratory specimens during the acute phase of infection. The lowest concentration  of SARS-CoV-2 viral copies this assay can detect is 138 copies/mL. A negative result does not preclude SARS-Cov-2 infection and should not be used as the sole basis for treatment or other patient management decisions. A negative result may occur with  improper specimen collection/handling, submission of specimen other than nasopharyngeal swab, presence of viral mutation(s) within the areas targeted by this assay, and inadequate number of viral copies(<138 copies/mL). A negative result must be combined with clinical observations, patient history, and epidemiological information. The expected result is Negative.  Fact Sheet for Patients:  EntrepreneurPulse.com.au  Fact Sheet for Healthcare Providers:  IncredibleEmployment.be  This test is no t yet approved or cleared by the  [place] FDA and  has been authorized for detection and/or diagnosis of SARS-CoV-2 by FDA under an Emergency Use Authorization (EUA). This EUA will remain  in effect (meaning this test can be used) for the duration of the COVID-19 declaration under Section 564(b)(1) of the Act, 21 U.S.C.section 360bbb-3(b)(1), unless the authorization is terminated  or revoked sooner.       Influenza A by PCR NEGATIVE NEGATIVE Final   Influenza B by PCR NEGATIVE NEGATIVE Final    Comment: (NOTE) The Xpert Xpress SARS-CoV-2/FLU/RSV plus assay is intended as an aid in the diagnosis of influenza from Nasopharyngeal swab specimens and should not be used as a sole basis for treatment. Nasal washings and aspirates are unacceptable for Xpert Xpress SARS-CoV-2/FLU/RSV testing.  Fact Sheet for Patients: EntrepreneurPulse.com.au  Fact Sheet for Healthcare Providers: IncredibleEmployment.be  This test is not yet approved or cleared by the [place] FDA and has been authorized for detection and/or diagnosis of SARS-CoV-2 by FDA under an Emergency Use Authorization (EUA). This EUA will remain in effect (meaning this test can be used) for the duration of the COVID-19 declaration under Section 564(b)(1) of the Act, 21 U.S.C. section 360bbb-3(b)(1), unless the authorization is terminated or revoked.  Performed at [facility], [address] [place], [place] [address]      Studies: DG Chest Portable 1 View  Result Date: 12/26/2020 CLINICAL DATA:  Nausea and vomiting x3 days. EXAM: PORTABLE CHEST 1 VIEW COMPARISON:  April 16, 2020 FINDINGS: The heart size and mediastinal contours are within normal limits. There is mild calcification of the aortic arch. Both lungs are clear. The visualized skeletal structures are unremarkable. IMPRESSION: No active disease. Electronically Signed   By: Virgina Norfolk M.D.   On: 12/26/2020 23:33    Scheduled Meds: .  diltiazem  240 mg Oral Daily  . enoxaparin (LOVENOX) injection  1 mg/kg Subcutaneous Q12H  . folic acid  1 mg Oral Daily  . insulin aspart  0-15 Units Subcutaneous Q4H  . insulin detemir  8 Units Subcutaneous QHS  . LORazepam  0-4 mg Intravenous Q6H   Followed by  . [START ON 12/29/2020] LORazepam  0-4 mg Intravenous Q12H  . multivitamin with minerals  1 tablet Oral Daily  . potassium chloride  20 mEq Oral BID  . thiamine  100 mg Oral Daily   Or  . thiamine  100 mg Intravenous Daily   Continuous Infusions: . amiodarone 30 mg/hr (12/27/20 1523)  . diltiazem (CARDIZEM) infusion 10 mg/hr (12/27/20 1516)    Assessment/Plan:  1. New onset rapid atrial fibrillation on amiodarone drip and Cardizem drip.  Started oral Cardizem and hopefully be able to get off the IV Cardizem drip.  Hesitant on full anticoagulation.  Echocardiogram pending. 2. Nausea vomiting and diarrhea.  Will start clear liquid  diet today.  No further diarrhea but I ordered stool studies. 3. Alcohol withdrawal received IV Ativan this morning.  Patient on withdrawal protocol.  Continue thiamine folic acid and multivitamin. 4. Hypomagnesemia and hypokalemia.  Replace potassium IV and orally and replace magnesium IV. 5. Type 1 diabetes mellitus patient takes NPH insulin at home.  We will have to put on Levemir here.  Sliding scale insulin ordered.        Code Status:     Code Status Orders  (From admission, onward)         Start     Ordered   12/27/20 0143  Full code  Continuous        12/27/20 0149        Code Status History    Date Active Date Inactive Code Status Order ID Comments User Context   04/01/2018 0151 04/01/2018 2033 Full Code [id number]  Amelia Jo, MD ED   05/12/2016 1207 05/12/2016 1546 Full Code [id number]  Poggi, Marshall Cork, MD Inpatient   Advance Care Planning Activity     Family Communication: *** (indicate person spoken with, relationship, and if by phone, the number) Disposition Plan: Status  is: Inpatient  {Inpatient:23812}  Dispo: The patient is from: {From:23814}              Anticipated d/c is to: {To:23815}              Anticipated d/c date is: {Days:23816}              Patient currently {Medically stable:23817}   Difficult to place patient {Yes/No:25151}        Consultants:  ***  Procedures:  ***  Antibiotics:  *** (indicate start date, and stop date if known)    Time spent: ***    [place]

## 2020-12-27 NOTE — Progress Notes (Signed)
Patient ID: Russell Gomez, male   DOB: 1961/03/18, 60 y.o.   MRN: [id number] [facility] PROGRESS NOTE  Russell Gomez YKD:[id number] DOB: 1960/12/10 DOA: 12/26/2020 PCP: Ria Bush, MD  HPI/Subjective: Patient came in with nausea vomiting and diarrhea.  States he saw slight blood in the vomit.  Has not had a bowel movement or vomited since yesterday.  Also found to be in rapid atrial fibrillation.  Objective: Vitals:   12/27/20 1149 12/27/20 1535  BP:    Pulse: 99 96  Resp: (!) 24   Temp:    SpO2: 95% 96%    Intake/Output Summary (Last 24 hours) at 12/27/2020 1659 Last data filed at 12/27/2020 1441 Gross per 24 hour  Intake 943.7 ml  Output 0 ml  Net 943.7 ml   Filed Weights   12/26/20 2300  Weight: 95.3 kg    ROS: Review of Systems  Respiratory: Negative for shortness of breath.   Cardiovascular: Positive for palpitations. Negative for chest pain.  Gastrointestinal: Positive for abdominal pain, diarrhea, nausea and vomiting.   Exam: Physical Exam HENT:     Head: Normocephalic.     Mouth/Throat:     Pharynx: No oropharyngeal exudate.  Eyes:     General: Lids are normal.     Conjunctiva/sclera: Conjunctivae normal.     Pupils: Pupils are equal, round, and reactive to light.  Cardiovascular:     Rate and Rhythm: Tachycardia present. Rhythm irregularly irregular.     Heart sounds: Normal heart sounds, S1 normal and S2 normal.  Pulmonary:     Breath sounds: Normal breath sounds. No decreased breath sounds, wheezing, rhonchi or rales.  Abdominal:     Palpations: Abdomen is soft.     Tenderness: There is no abdominal tenderness.  Musculoskeletal:     Right lower leg: No swelling.     Left lower leg: No swelling.  Skin:    General: Skin is warm.     Findings: No rash.  Neurological:     Mental Status: He is alert and oriented to person, place, and time.       Data Reviewed: Basic Metabolic Panel: Recent Labs  Lab 12/26/20 2257 12/27/20 0521   NA 142 140  K 3.1* 2.9*  CL 95* 93*  CO2 29 30  GLUCOSE 93 118*  BUN 8 7  CREATININE 0.73 0.80  CALCIUM 6.3* 6.8*  MG 1.1* 1.7  PHOS  --  2.5   Liver Function Tests: Recent Labs  Lab 12/26/20 2257  AST 91*  ALT 35  ALKPHOS 204*  BILITOT 1.2  PROT 5.9*  ALBUMIN 2.8*   CBC: Recent Labs  Lab 12/26/20 2257 12/27/20 0521  WBC 10.9* 12.1*  NEUTROABS 8.4*  --   HGB 15.0 14.1  HCT 42.5 39.5  MCV 97.7 97.1  PLT 196 204    CBG: Recent Labs  Lab 12/27/20 0252 12/27/20 0508 12/27/20 0743 12/27/20 1130  GLUCAP 283* 133* 146* 164*    Recent Results (from the past 240 hour(s))  Resp Panel by RT-PCR (Flu A&B, Covid) Nasopharyngeal Swab     Status: None   Collection Time: 12/26/20 10:57 PM   Specimen: Nasopharyngeal Swab; Nasopharyngeal(NP) swabs in vial transport medium  Result Value Ref Range Status   SARS Coronavirus 2 by RT PCR NEGATIVE NEGATIVE Final    Comment: (NOTE) SARS-CoV-2 target nucleic acids are NOT DETECTED.  The SARS-CoV-2 RNA is generally detectable in upper respiratory specimens during the acute phase of infection. The lowest concentration  of SARS-CoV-2 viral copies this assay can detect is 138 copies/mL. A negative result does not preclude SARS-Cov-2 infection and should not be used as the sole basis for treatment or other patient management decisions. A negative result may occur with  improper specimen collection/handling, submission of specimen other than nasopharyngeal swab, presence of viral mutation(s) within the areas targeted by this assay, and inadequate number of viral copies(<138 copies/mL). A negative result must be combined with clinical observations, patient history, and epidemiological information. The expected result is Negative.  Fact Sheet for Patients:  EntrepreneurPulse.com.au  Fact Sheet for Healthcare Providers:  IncredibleEmployment.be  This test is no t yet approved or cleared by the  [place] FDA and  has been authorized for detection and/or diagnosis of SARS-CoV-2 by FDA under an Emergency Use Authorization (EUA). This EUA will remain  in effect (meaning this test can be used) for the duration of the COVID-19 declaration under Section 564(b)(1) of the Act, 21 U.S.C.section 360bbb-3(b)(1), unless the authorization is terminated  or revoked sooner.       Influenza A by PCR NEGATIVE NEGATIVE Final   Influenza B by PCR NEGATIVE NEGATIVE Final    Comment: (NOTE) The Xpert Xpress SARS-CoV-2/FLU/RSV plus assay is intended as an aid in the diagnosis of influenza from Nasopharyngeal swab specimens and should not be used as a sole basis for treatment. Nasal washings and aspirates are unacceptable for Xpert Xpress SARS-CoV-2/FLU/RSV testing.  Fact Sheet for Patients: EntrepreneurPulse.com.au  Fact Sheet for Healthcare Providers: IncredibleEmployment.be  This test is not yet approved or cleared by the [place] FDA and has been authorized for detection and/or diagnosis of SARS-CoV-2 by FDA under an Emergency Use Authorization (EUA). This EUA will remain in effect (meaning this test can be used) for the duration of the COVID-19 declaration under Section 564(b)(1) of the Act, 21 U.S.C. section 360bbb-3(b)(1), unless the authorization is terminated or revoked.  Performed at [facility], [address], [place], [place] [address]      Studies: DG Chest Portable 1 View  Result Date: 12/26/2020 CLINICAL DATA:  Nausea and vomiting x3 days. EXAM: PORTABLE CHEST 1 VIEW COMPARISON:  April 16, 2020 FINDINGS: The heart size and mediastinal contours are within normal limits. There is mild calcification of the aortic arch. Both lungs are clear. The visualized skeletal structures are unremarkable. IMPRESSION: No active disease. Electronically Signed   By: Virgina Norfolk M.D.   On: 12/26/2020 23:33    Scheduled Meds: .  diltiazem  240 mg Oral Daily  . enoxaparin (LOVENOX) injection  1 mg/kg Subcutaneous Q12H  . folic acid  1 mg Oral Daily  . insulin aspart  0-5 Units Subcutaneous QHS  . [START ON 12/28/2020] insulin aspart  0-9 Units Subcutaneous TID WC  . insulin detemir  8 Units Subcutaneous QHS  . LORazepam  0-4 mg Intravenous Q6H   Followed by  . [START ON 12/29/2020] LORazepam  0-4 mg Intravenous Q12H  . multivitamin with minerals  1 tablet Oral Daily  . potassium chloride  20 mEq Oral BID  . thiamine  100 mg Oral Daily   Or  . thiamine  100 mg Intravenous Daily   Continuous Infusions: . amiodarone 30 mg/hr (12/27/20 1523)  . diltiazem (CARDIZEM) infusion 10 mg/hr (12/27/20 1516)    Assessment/Plan:  1. New onset rapid atrial fibrillation, likely paroxysmal.  On amiodarone drip and Cardizem drip.  Started oral Cardizem and hopefully be able to get off the IV Cardizem drip.  Hesitant  on full anticoagulation.  Echocardiogram pending. 2. Nausea vomiting and diarrhea.  Will start clear liquid diet today.  No further diarrhea but I ordered stool studies. 3. Alcohol withdrawal received IV Ativan this morning.  Patient on withdrawal protocol.  Continue thiamine folic acid and multivitamin. 4. Hypomagnesemia and hypokalemia.  Replace potassium IV and orally and replace magnesium IV. 5. Type 1 diabetes mellitus with hyperlipidemia. patient takes NPH insulin at home.  We will have to put on Levemir here.  Sliding scale insulin ordered.        Code Status:     Code Status Orders  (From admission, onward)         Start     Ordered   12/27/20 0143  Full code  Continuous        12/27/20 0149        Code Status History    Date Active Date Inactive Code Status Order ID Comments User Context   04/01/2018 0151 04/01/2018 2033 Full Code [id number]  Amelia Jo, MD ED   05/12/2016 1207 05/12/2016 1546 Full Code [id number]  Poggi, Marshall Cork, MD Inpatient   Advance Care Planning Activity     Family  Communication: Patient deferred me calling family Disposition Plan: Status is: Inpatient  Dispo: The patient is from: Home              Anticipated d/c is to: Home              Anticipated d/c date is: Will depend on if he goes through full DTs or not              Patient currently not medically stable for disposition, on IV amiodarone and IV Cardizem drips.   Difficult to place patient.  No  Time spent: 31 minutes  [place]

## 2020-12-28 DIAGNOSIS — F1010 Alcohol abuse, uncomplicated: Secondary | ICD-10-CM

## 2020-12-28 LAB — COMPREHENSIVE METABOLIC PANEL
ALT: 25 U/L (ref 0–44)
AST: 51 U/L — ABNORMAL HIGH (ref 15–41)
Albumin: 2.4 g/dL — ABNORMAL LOW (ref 3.5–5.0)
Alkaline Phosphatase: 197 U/L — ABNORMAL HIGH (ref 38–126)
Anion gap: 11 (ref 5–15)
BUN: 7 mg/dL (ref 6–20)
CO2: 31 mmol/L (ref 22–32)
Calcium: 6.7 mg/dL — ABNORMAL LOW (ref 8.9–10.3)
Chloride: 93 mmol/L — ABNORMAL LOW (ref 98–111)
Creatinine, Ser: 0.7 mg/dL (ref 0.61–1.24)
GFR, Estimated: >60 mL/min (ref >60)
Glucose, Bld: 93 mg/dL (ref 70–99)
Potassium: 2.8 mmol/L — ABNORMAL LOW (ref 3.5–5.1)
Sodium: 135 mmol/L (ref 135–145)
Total Bilirubin: 1.7 mg/dL — ABNORMAL HIGH (ref 0.3–1.2)
Total Protein: 5.6 g/dL — ABNORMAL LOW (ref 6.5–8.1)

## 2020-12-28 LAB — CBC
HCT: 36.3 % — ABNORMAL LOW (ref 39.0–52.0)
Hemoglobin: 13.1 g/dL (ref 13.0–17.0)
MCH: 35.2 pg — ABNORMAL HIGH (ref 26.0–34.0)
MCHC: 36.1 g/dL — ABNORMAL HIGH (ref 30.0–36.0)
MCV: 97.6 fL (ref 80.0–100.0)
Platelets: 133 10*3/uL — ABNORMAL LOW (ref 150–400)
RBC: 3.72 MIL/uL — ABNORMAL LOW (ref 4.22–5.81)
RDW: 14.8 % (ref 11.5–15.5)
WBC: 11.4 10*3/uL — ABNORMAL HIGH (ref 4.0–10.5)
nRBC: 0 % (ref 0.0–0.2)

## 2020-12-28 LAB — ECHOCARDIOGRAM COMPLETE
AR max vel: 2.18 cm2
AV Peak grad: 11.7 mmHg
Ao pk vel: 1.71 m/s
Area-P 1/2: 3.83 cm2
Height: 74 in
S' Lateral: 4.55 cm
Weight: 3360 oz

## 2020-12-28 LAB — GLUCOSE, CAPILLARY
Glucose-Capillary: 60 mg/dL — ABNORMAL LOW (ref 70–99)
Glucose-Capillary: 189 mg/dL — ABNORMAL HIGH (ref 70–99)
Glucose-Capillary: 220 mg/dL — ABNORMAL HIGH (ref 70–99)
Glucose-Capillary: 348 mg/dL — ABNORMAL HIGH (ref 70–99)
Glucose-Capillary: 259 mg/dL — ABNORMAL HIGH (ref 70–99)

## 2020-12-28 LAB — PHOSPHORUS: Phosphorus: 2.1 mg/dL — ABNORMAL LOW (ref 2.5–4.6)

## 2020-12-28 LAB — MAGNESIUM: Magnesium: 1.8 mg/dL (ref 1.7–2.4)

## 2020-12-28 MED ORDER — APIXABAN 5 MG PO TABS
5.0000 mg | ORAL_TABLET | Freq: Two times a day (BID) | ORAL | Status: DC
  Administered 2020-12-28 – 2020-12-29 (×3): 5 mg via ORAL
  Filled 2020-12-28 (×3): qty 1

## 2020-12-28 MED ORDER — ALUM & MAG HYDROXIDE-SIMETH 200-200-20 MG/5ML PO SUSP
30.0000 mL | Freq: Four times a day (QID) | ORAL | Status: DC | PRN
  Administered 2020-12-28 – 2020-12-29 (×2): 30 mL via ORAL
  Filled 2020-12-28 (×2): qty 30

## 2020-12-28 MED ORDER — POTASSIUM CHLORIDE CRYS ER 20 MEQ PO TBCR
40.0000 meq | EXTENDED_RELEASE_TABLET | Freq: Once | ORAL | Status: AC
  Administered 2020-12-28: 40 meq via ORAL
  Filled 2020-12-28: qty 2

## 2020-12-28 MED ORDER — POTASSIUM CHLORIDE 20 MEQ PO PACK
40.0000 meq | PACK | Freq: Two times a day (BID) | ORAL | Status: DC
  Administered 2020-12-28 (×2): 40 meq via ORAL
  Filled 2020-12-28 (×2): qty 2

## 2020-12-28 MED ORDER — AMIODARONE HCL 200 MG PO TABS
200.0000 mg | ORAL_TABLET | Freq: Two times a day (BID) | ORAL | Status: DC
  Administered 2020-12-28 – 2020-12-29 (×3): 200 mg via ORAL
  Filled 2020-12-28 (×3): qty 1

## 2020-12-28 MED ORDER — METOPROLOL TARTRATE 50 MG PO TABS
50.0000 mg | ORAL_TABLET | Freq: Two times a day (BID) | ORAL | Status: DC
  Administered 2020-12-28 – 2020-12-29 (×3): 50 mg via ORAL
  Filled 2020-12-28 (×3): qty 1

## 2020-12-28 MED ORDER — POTASSIUM CHLORIDE 10 MEQ/100ML IV SOLN
10.0000 meq | INTRAVENOUS | Status: AC
  Administered 2020-12-28 (×2): 10 meq via INTRAVENOUS
  Filled 2020-12-28 (×4): qty 100

## 2020-12-28 MED ORDER — MAGNESIUM SULFATE 2 GM/50ML IV SOLN
2.0000 g | Freq: Once | INTRAVENOUS | Status: AC
  Administered 2020-12-28: 2 g via INTRAVENOUS
  Filled 2020-12-28: qty 50

## 2020-12-28 MED ORDER — ONDANSETRON HCL 4 MG/2ML IJ SOLN
4.0000 mg | Freq: Four times a day (QID) | INTRAMUSCULAR | Status: DC | PRN
  Administered 2020-12-28: 4 mg via INTRAVENOUS
  Filled 2020-12-28: qty 2

## 2020-12-28 MED ORDER — POTASSIUM & SODIUM PHOSPHATES 280-160-250 MG PO PACK
1.0000 | PACK | Freq: Three times a day (TID) | ORAL | Status: DC
  Administered 2020-12-28 (×2): 1 via ORAL
  Filled 2020-12-28 (×4): qty 1

## 2020-12-28 MED ORDER — INSULIN ASPART 100 UNIT/ML ~~LOC~~ SOLN
2.0000 [IU] | Freq: Three times a day (TID) | SUBCUTANEOUS | Status: DC
  Administered 2020-12-28 – 2020-12-29 (×3): 2 [IU] via SUBCUTANEOUS
  Filled 2020-12-28 (×3): qty 1

## 2020-12-28 NOTE — Progress Notes (Signed)
[facility] Encounter Note  Patient: Russell Gomez / Admit Date: 12/26/2020 / Date of Encounter: 12/28/2020, 6:31 AM   Subjective: Patient has had significant improvements in symptoms of weakness fatigue shortness of breath and somewhat delirium.  The patient does have a better heart rate control of atrial fibrillation with combination of medication management but currently he has not had spontaneous conversion to normal sinus rhythm.  There has been no evidence of congestive heart failure or acute coronary syndrome at this time Echocardiogram showing normal LV systolic function with ejection fraction 55 to 60% with no evidence of significant valvular heart disease  Review of Systems: Positive for: None Negative for: Vision change, hearing change, syncope, dizziness, nausea, vomiting,diarrhea, bloody stool, stomach pain, cough, congestion, diaphoresis, urinary frequency, urinary pain,skin lesions, skin rashes Others previously listed  Objective: Telemetry: Atrial fibrillation with variable heart rate Physical Exam: Blood pressure 117/70, pulse 78, temperature 98.2 F (36.8 C), temperature source Oral, resp. rate 17, height 6\' 2"  (1.88 m), weight 98.3 kg, SpO2 93 %. Body mass index is 27.82 kg/m. General: Well developed, well nourished, in no acute distress. Head: Normocephalic, atraumatic, sclera non-icteric, no xanthomas, nares are without discharge. Neck: No apparent masses Lungs: Normal respirations with no wheezes, no rhonchi, no rales , no crackles   Heart: Irregular rate and rhythm, normal S1 S2, no murmur, no rub, no gallop, PMI is normal size and placement, carotid upstroke normal without bruit, jugular venous pressure normal Abdomen: Soft, non-tender, non-distended with normoactive bowel sounds. No hepatosplenomegaly. Abdominal aorta is normal size without bruit Extremities: No edema, no clubbing, no cyanosis, no ulcers,  Peripheral: 2+ radial, 2+ femoral, 2+  dorsal pedal pulses Neuro: Alert and oriented. Moves all extremities spontaneously. Psych:  Responds to questions appropriately with a normal affect.   Intake/Output Summary (Last 24 hours) at 12/28/2020 0631 Last data filed at 12/28/2020 0538 Gross per 24 hour  Intake 1402.64 ml  Output 450 ml  Net 952.64 ml    Inpatient Medications:  . diltiazem  240 mg Oral Daily  . enoxaparin (LOVENOX) injection  1 mg/kg Subcutaneous Q12H  . folic acid  1 mg Oral Daily  . insulin aspart  0-5 Units Subcutaneous QHS  . insulin aspart  0-9 Units Subcutaneous TID WC  . insulin detemir  8 Units Subcutaneous QHS  . LORazepam  0-4 mg Intravenous Q6H   Followed by  . [START ON 12/29/2020] LORazepam  0-4 mg Intravenous Q12H  . multivitamin with minerals  1 tablet Oral Daily  . potassium chloride  20 mEq Oral BID  . thiamine  100 mg Oral Daily   Or  . thiamine  100 mg Intravenous Daily   Infusions:  . amiodarone 30 mg/hr (12/28/20 0330)  . diltiazem (CARDIZEM) infusion Stopped (12/28/20 0452)    Labs: Recent Labs    12/27/20 0521 12/28/20 0529  NA 140 135  K 2.9* 2.8*  CL 93* 93*  CO2 30 31  GLUCOSE 118* 93  BUN 7 7  CREATININE 0.80 0.70  CALCIUM 6.8* 6.7*  MG 1.7 1.8  PHOS 2.5 2.1*   Recent Labs    12/26/20 2257 12/28/20 0529  AST 91* 51*  ALT 35 25  ALKPHOS 204* 197*  BILITOT 1.2 1.7*  PROT 5.9* 5.6*  ALBUMIN 2.8* 2.4*   Recent Labs    12/26/20 2257 12/27/20 0521 12/28/20 0529  WBC 10.9* 12.1* 11.4*  NEUTROABS 8.4*  --   --   HGB 15.0 14.1 13.1  HCT 42.5 39.5 36.3*  MCV 97.7 97.1 97.6  PLT 196 204 133*   No results for input(s): CKTOTAL, CKMB, TROPONINI in the last 72 hours. Invalid input(s): POCBNP Recent Labs    12/27/20 0521  HGBA1C 7.4*     Weights: Filed Weights   12/26/20 2300 12/28/20 0100 12/28/20 0318  Weight: 95.3 kg 96.5 kg 98.3 kg     Radiology/Studies:  DG Chest Portable 1 View  Result Date: 12/26/2020 CLINICAL DATA:  Nausea and vomiting  x3 days. EXAM: PORTABLE CHEST 1 VIEW COMPARISON:  April 16, 2020 FINDINGS: The heart size and mediastinal contours are within normal limits. There is mild calcification of the aortic arch. Both lungs are clear. The visualized skeletal structures are unremarkable. IMPRESSION: No active disease. Electronically Signed   By: Virgina Norfolk M.D.   On: 12/26/2020 23:33   ECHOCARDIOGRAM COMPLETE  Result Date: 12/28/2020    ECHOCARDIOGRAM REPORT   Patient Name:   Russell Gomez Date of Exam: 12/27/2020 Medical Rec #:  [id number]      Height:       74.0 in Accession #:    [id number]     Weight:       210.0 lb Date of Birth:  [birth year]     BSA:          2.219 m Patient Age:    60 years       BP:           133/67 mmHg Patient Gender: M              HR:           116 bpm. Exam Location:  [facility] Procedure: Cardiac Doppler, Color Doppler and 2D Echo Indications:     Atrial Fibrillation I48.91  History:         Patient has no prior history of Echocardiogram examinations.                  Risk Factors:Hypertension and Diabetes.  Sonographer:     Alyse Low Roar Referring Phys:  [id number] Athena Masse Diagnosing Phys: Serafina Royals MD IMPRESSIONS  1. Left ventricular ejection fraction, by estimation, is 60 to 65%. The left ventricle has normal function. The left ventricle has no regional wall motion abnormalities. Left ventricular diastolic parameters were normal.  2. Right ventricular systolic function is normal. The right ventricular size is normal.  3. Left atrial size was mildly dilated.  4. The mitral valve is normal in structure. Mild to moderate mitral valve regurgitation.  5. The aortic valve is normal in structure. Aortic valve regurgitation is not visualized. FINDINGS  Left Ventricle: Left ventricular ejection fraction, by estimation, is 60 to 65%. The left ventricle has normal function. The left ventricle has no regional wall motion abnormalities. The left ventricular internal cavity size was normal in size. There is  no  left ventricular hypertrophy. Left ventricular diastolic parameters were normal. Right Ventricle: The right ventricular size is normal. No increase in right ventricular wall thickness. Right ventricular systolic function is normal. Left Atrium: Left atrial size was mildly dilated. Right Atrium: Right atrial size was normal in size. Pericardium: There is no evidence of pericardial effusion. Mitral Valve: The mitral valve is normal in structure. Mild to moderate mitral valve regurgitation. Tricuspid Valve: The tricuspid valve is normal in structure. Tricuspid valve regurgitation is trivial. Aortic Valve: The aortic valve is normal in structure. Aortic valve regurgitation is not visualized. Aortic valve peak gradient measures 11.7 mmHg.  Pulmonic Valve: The pulmonic valve was normal in structure. Pulmonic valve regurgitation is not visualized. Aorta: The aortic root and ascending aorta are structurally normal, with no evidence of dilitation. IAS/Shunts: No atrial level shunt detected by color flow Doppler.  LEFT VENTRICLE PLAX 2D LVIDd:         5.39 cm  Diastology LVIDs:         4.55 cm  LV e' medial:    6.53 cm/s LV PW:         1.25 cm  LV E/e' medial:  8.3 LV IVS:        1.33 cm  LV e' lateral:   8.92 cm/s LVOT diam:     2.20 cm  LV E/e' lateral: 6.1 LVOT Area:     3.80 cm  RIGHT VENTRICLE RV Mid diam:    3.49 cm RV S prime:     17.40 cm/s TAPSE (M-mode): 2.2 cm LEFT ATRIUM             Index       RIGHT ATRIUM           Index LA diam:        4.60 cm 2.07 cm/m  RA Area:     16.50 cm LA Vol (A2C):   66.8 ml 30.10 ml/m RA Volume:   43.40 ml  19.56 ml/m LA Vol (A4C):   84.7 ml 38.17 ml/m LA Biplane Vol: 79.5 ml 35.83 ml/m  AORTIC VALVE                PULMONIC VALVE AV Area (Vmax): 2.18 cm    PV Vmax:        0.99 m/s AV Vmax:        171.00 cm/s PV Peak grad:   3.9 mmHg AV Peak Grad:   11.7 mmHg   RVOT Peak grad: 2 mmHg LVOT Vmax:      98.10 cm/s  AORTA Ao Root diam: 3.50 cm MITRAL VALVE MV Area (PHT): 3.83 cm     SHUNTS MV Decel Time: 198 msec    Systemic Diam: 2.20 cm MV E velocity: 54.00 cm/s MV A velocity: 78.00 cm/s MV E/A ratio:  0.69 MV A Prime:    12.0 cm/s Serafina Royals MD Electronically signed by Serafina Royals MD Signature Date/Time: 12/28/2020/5:54:56 AM    Final      Assessment and Recommendation  60 y.o. male with diabetes hypertension with acute atrial fibrillation with rapid ventricular rate without evidence of congestive heart failure or myocardial infarction or acute coronary syndrome 1.  Continue amiodarone and transition to oral amiodarone 200 mg twice per day for possible help with spontaneous conversion to normal sinus rhythm 2.  Eliquis 5 mg twice per day for further risk reduction in stroke with atrial fibrillation while transitioning and possibly converting to normal rhythm 3.  Heart rate control of atrial fibrillation with diltiazem metoprolol combination 4.  No further cardiac diagnostics necessary at this time 5.  If patient ambulating well with good heart rate control as per above okay for discharge to home with further treatment options after above as outpatient  Signed, Serafina Royals M.D. FACC

## 2020-12-28 NOTE — Progress Notes (Signed)
Patient ID: Russell Gomez, male   DOB: [birth year], 60 y.o.   MRN: [id number] [facility] PROGRESS NOTE  SIGFREDO SCHREIER ATF:[id number] DOB: [birth year] DOA: 12/26/2020 PCP: Ria Bush, MD  HPI/Subjective: Patient states that he feels a lot better today.  He is wanting to advance his diet.  No further nausea vomiting or diarrhea.  No abdominal pain.  Admitted with new onset rapid atrial fibrillation  Objective: Vitals:   12/28/20 1100 12/28/20 1151  BP: 115/67 120/72  Pulse: 65 69  Resp:  18  Temp:  98.3 F (36.8 C)  SpO2:  92%    Intake/Output Summary (Last 24 hours) at 12/28/2020 1515 Last data filed at 12/28/2020 0538 Gross per 24 hour  Intake 459.05 ml  Output 450 ml  Net 9.05 ml   Filed Weights   12/26/20 2300 12/28/20 0100 12/28/20 0318  Weight: 95.3 kg 96.5 kg 98.3 kg    ROS: Review of Systems  Respiratory: Negative for shortness of breath.   Cardiovascular: Negative for chest pain.  Gastrointestinal: Negative for abdominal pain, nausea and vomiting.   Exam: Physical Exam HENT:     Head: Normocephalic.     Mouth/Throat:     Pharynx: No oropharyngeal exudate.  Eyes:     General: Lids are normal.     Conjunctiva/sclera: Conjunctivae normal.     Pupils: Pupils are equal, round, and reactive to light.  Cardiovascular:     Rate and Rhythm: Normal rate. Rhythm irregularly irregular.     Heart sounds: Normal heart sounds, S1 normal and S2 normal.  Pulmonary:     Breath sounds: No decreased breath sounds, wheezing, rhonchi or rales.  Abdominal:     Palpations: Abdomen is soft.     Tenderness: There is no abdominal tenderness.  Musculoskeletal:     Right ankle: Swelling present.     Left ankle: Swelling present.  Skin:    General: Skin is warm.     Findings: No rash.  Neurological:     Mental Status: He is alert and oriented to person, place, and time.       Data Reviewed: Basic Metabolic Panel: Recent Labs  Lab 12/26/20 2257  12/27/20 0521 12/28/20 0529  NA 142 140 135  K 3.1* 2.9* 2.8*  CL 95* 93* 93*  CO2 29 30 31   GLUCOSE 93 118* 93  BUN 8 7 7   CREATININE 0.73 0.80 0.70  CALCIUM 6.3* 6.8* 6.7*  MG 1.1* 1.7 1.8  PHOS  --  2.5 2.1*   Liver Function Tests: Recent Labs  Lab 12/26/20 2257 12/28/20 0529  AST 91* 51*  ALT 35 25  ALKPHOS 204* 197*  BILITOT 1.2 1.7*  PROT 5.9* 5.6*  ALBUMIN 2.8* 2.4*   CBC: Recent Labs  Lab 12/26/20 2257 12/27/20 0521 12/28/20 0529  WBC 10.9* 12.1* 11.4*  NEUTROABS 8.4*  --   --   HGB 15.0 14.1 13.1  HCT 42.5 39.5 36.3*  MCV 97.7 97.1 97.6  PLT 196 204 133*    CBG: Recent Labs  Lab 12/27/20 2020 12/27/20 2217 12/28/20 0810 12/28/20 1012 12/28/20 1153  GLUCAP 273* 261* 60* 189* 220*    Recent Results (from the past 240 hour(s))  Resp Panel by RT-PCR (Flu A&B, Covid) Nasopharyngeal Swab     Status: None   Collection Time: 12/26/20 10:57 PM   Specimen: Nasopharyngeal Swab; Nasopharyngeal(NP) swabs in vial transport medium  Result Value Ref Range Status   SARS Coronavirus 2 by RT PCR NEGATIVE NEGATIVE  Final    Comment: (NOTE) SARS-CoV-2 target nucleic acids are NOT DETECTED.  The SARS-CoV-2 RNA is generally detectable in upper respiratory specimens during the acute phase of infection. The lowest concentration of SARS-CoV-2 viral copies this assay can detect is 138 copies/mL. A negative result does not preclude SARS-Cov-2 infection and should not be used as the sole basis for treatment or other patient management decisions. A negative result may occur with  improper specimen collection/handling, submission of specimen other than nasopharyngeal swab, presence of viral mutation(s) within the areas targeted by this assay, and inadequate number of viral copies(<138 copies/mL). A negative result must be combined with clinical observations, patient history, and epidemiological information. The expected result is Negative.  Fact Sheet for Patients:   EntrepreneurPulse.com.au  Fact Sheet for Healthcare Providers:  IncredibleEmployment.be  This test is no t yet approved or cleared by the [place] FDA and  has been authorized for detection and/or diagnosis of SARS-CoV-2 by FDA under an Emergency Use Authorization (EUA). This EUA will remain  in effect (meaning this test can be used) for the duration of the COVID-19 declaration under Section 564(b)(1) of the Act, 21 U.S.C.section 360bbb-3(b)(1), unless the authorization is terminated  or revoked sooner.       Influenza A by PCR NEGATIVE NEGATIVE Final   Influenza B by PCR NEGATIVE NEGATIVE Final    Comment: (NOTE) The Xpert Xpress SARS-CoV-2/FLU/RSV plus assay is intended as an aid in the diagnosis of influenza from Nasopharyngeal swab specimens and should not be used as a sole basis for treatment. Nasal washings and aspirates are unacceptable for Xpert Xpress SARS-CoV-2/FLU/RSV testing.  Fact Sheet for Patients: EntrepreneurPulse.com.au  Fact Sheet for Healthcare Providers: IncredibleEmployment.be  This test is not yet approved or cleared by the [place] FDA and has been authorized for detection and/or diagnosis of SARS-CoV-2 by FDA under an Emergency Use Authorization (EUA). This EUA will remain in effect (meaning this test can be used) for the duration of the COVID-19 declaration under Section 564(b)(1) of the Act, 21 U.S.C. section 360bbb-3(b)(1), unless the authorization is terminated or revoked.  Performed at [facility], [address], [place], [place] [address]      Studies: DG Chest Portable 1 View  Result Date: 12/26/2020 CLINICAL DATA:  Nausea and vomiting x3 days. EXAM: PORTABLE CHEST 1 VIEW COMPARISON:  April 16, 2020 FINDINGS: The heart size and mediastinal contours are within normal limits. There is mild calcification of the aortic arch. Both lungs are clear. The  visualized skeletal structures are unremarkable. IMPRESSION: No active disease. Electronically Signed   By: Virgina Norfolk M.D.   On: 12/26/2020 23:33   ECHOCARDIOGRAM COMPLETE  Result Date: 12/28/2020    ECHOCARDIOGRAM REPORT   Patient Name:   Russell Gomez Date of Exam: 12/27/2020 Medical Rec #:  [id number]      Height:       74.0 in Accession #:    [id number]     Weight:       210.0 lb Date of Birth:  10/23/1961     BSA:          2.219 m Patient Age:    25 years       BP:           133/67 mmHg Patient Gender: M              HR:           116 bpm. Exam Location:  [facility] Procedure: Cardiac  Doppler, Color Doppler and 2D Echo Indications:     Atrial Fibrillation I48.91  History:         Patient has no prior history of Echocardiogram examinations.                  Risk Factors:Hypertension and Diabetes.  Sonographer:     Alyse Low Roar Referring Phys:  [id number] Athena Masse Diagnosing Phys: Serafina Royals MD IMPRESSIONS  1. Left ventricular ejection fraction, by estimation, is 60 to 65%. The left ventricle has normal function. The left ventricle has no regional wall motion abnormalities. Left ventricular diastolic parameters were normal.  2. Right ventricular systolic function is normal. The right ventricular size is normal.  3. Left atrial size was mildly dilated.  4. The mitral valve is normal in structure. Mild to moderate mitral valve regurgitation.  5. The aortic valve is normal in structure. Aortic valve regurgitation is not visualized. FINDINGS  Left Ventricle: Left ventricular ejection fraction, by estimation, is 60 to 65%. The left ventricle has normal function. The left ventricle has no regional wall motion abnormalities. The left ventricular internal cavity size was normal in size. There is  no left ventricular hypertrophy. Left ventricular diastolic parameters were normal. Right Ventricle: The right ventricular size is normal. No increase in right ventricular wall thickness. Right ventricular systolic  function is normal. Left Atrium: Left atrial size was mildly dilated. Right Atrium: Right atrial size was normal in size. Pericardium: There is no evidence of pericardial effusion. Mitral Valve: The mitral valve is normal in structure. Mild to moderate mitral valve regurgitation. Tricuspid Valve: The tricuspid valve is normal in structure. Tricuspid valve regurgitation is trivial. Aortic Valve: The aortic valve is normal in structure. Aortic valve regurgitation is not visualized. Aortic valve peak gradient measures 11.7 mmHg. Pulmonic Valve: The pulmonic valve was normal in structure. Pulmonic valve regurgitation is not visualized. Aorta: The aortic root and ascending aorta are structurally normal, with no evidence of dilitation. IAS/Shunts: No atrial level shunt detected by color flow Doppler.  LEFT VENTRICLE PLAX 2D LVIDd:         5.39 cm  Diastology LVIDs:         4.55 cm  LV e' medial:    6.53 cm/s LV PW:         1.25 cm  LV E/e' medial:  8.3 LV IVS:        1.33 cm  LV e' lateral:   8.92 cm/s LVOT diam:     2.20 cm  LV E/e' lateral: 6.1 LVOT Area:     3.80 cm  RIGHT VENTRICLE RV Mid diam:    3.49 cm RV S prime:     17.40 cm/s TAPSE (M-mode): 2.2 cm LEFT ATRIUM             Index       RIGHT ATRIUM           Index LA diam:        4.60 cm 2.07 cm/m  RA Area:     16.50 cm LA Vol (A2C):   66.8 ml 30.10 ml/m RA Volume:   43.40 ml  19.56 ml/m LA Vol (A4C):   84.7 ml 38.17 ml/m LA Biplane Vol: 79.5 ml 35.83 ml/m  AORTIC VALVE                PULMONIC VALVE AV Area (Vmax): 2.18 cm    PV Vmax:        0.99 m/s AV Vmax:  171.00 cm/s PV Peak grad:   3.9 mmHg AV Peak Grad:   11.7 mmHg   RVOT Peak grad: 2 mmHg LVOT Vmax:      98.10 cm/s  AORTA Ao Root diam: 3.50 cm MITRAL VALVE MV Area (PHT): 3.83 cm    SHUNTS MV Decel Time: 198 msec    Systemic Diam: 2.20 cm MV E velocity: 54.00 cm/s MV A velocity: 78.00 cm/s MV E/A ratio:  0.69 MV A Prime:    12.0 cm/s Serafina Royals MD Electronically signed by Serafina Royals  MD Signature Date/Time: 12/28/2020/5:54:56 AM    Final     Scheduled Meds: . amiodarone  200 mg Oral BID  . apixaban  5 mg Oral BID  . diltiazem  240 mg Oral Daily  . folic acid  1 mg Oral Daily  . insulin aspart  0-5 Units Subcutaneous QHS  . insulin aspart  0-9 Units Subcutaneous TID WC  . insulin detemir  8 Units Subcutaneous QHS  . LORazepam  0-4 mg Intravenous Q6H   Followed by  . [START ON 12/29/2020] LORazepam  0-4 mg Intravenous Q12H  . metoprolol tartrate  50 mg Oral BID  . multivitamin with minerals  1 tablet Oral Daily  . potassium chloride  40 mEq Oral BID  . thiamine  100 mg Oral Daily   Or  . thiamine  100 mg Intravenous Daily    Assessment/Plan:  1. Rapid atrial fibrillation initially required amiodarone drip and Cardizem drip.  Now on oral Cardizem and oral amiodarone.  Cardiology switched to Eliquis.  Echocardiogram showed a normal EF of 60 to 65% left atrial size mildly dilated and mild to moderate mitral valve regurgitation. 2. Hypomagnesemia and hypokalemia replace magnesium IV again today.  Replace potassium IV and orally today.  Recheck electrolytes tomorrow. 3. Hypophosphatemia.  Replace Neutra-Phos today and recheck phosphorus tomorrow. 4. Alcohol abuse.  Currently on alcohol withdrawal protocol. 5. Type 1 diabetes mellitus with hyperlipidemia.  Patient takes NPH insulin at home.  Currently on detemir insulin and sliding scale.  Hemoglobin A1c 7.4. 6. Nausea vomiting diarrhea has resolved 7. Nursing staff to walk with the patient today        Code Status:     Code Status Orders  (From admission, onward)         Start     Ordered   12/27/20 0143  Full code  Continuous        12/27/20 0149        Code Status History    Date Active Date Inactive Code Status Order ID Comments User Context   04/01/2018 0151 04/01/2018 2033 Full Code [id number]  Amelia Jo, MD ED   05/12/2016 1207 05/12/2016 1546 Full Code [id number]  Poggi, Marshall Cork, MD Inpatient    Advance Care Planning Activity     Family Communication: Deferred Disposition Plan: Status is: Inpatient  Dispo: The patient is from: Home              Anticipated d/c is to: Home              Anticipated d/c date is: We will see what his electrolytes are tomorrow prior to making a decision about disposition.              Patient currently doing better than when he came in.  Replacing electrolytes IV and orally today and rechecking tomorrow.   Difficult to place patient.  No.  Time spent: 28 minutes  Rini Moffit [employer]  [facility]

## 2020-12-28 NOTE — TOC Initial Note (Signed)
Transition of Care [facility]) - Initial/Assessment Note    Patient Details  Name: Russell Gomez MRN: [id number] Date of Birth: 10/02/1961  Transition [facility]) CM/SW Contact:    Harriet Masson, RN Phone Number: [phone] 12/28/2020, 3:26 PMconditions   Clinical Narrative:                 Spoke with pt today and provided substance/alcohol abuse counseling. Discussed the risk involved based upon his ongoing medical issues. Discussed the availability of treatment centers and counseling if needed.  Pt verbalized an understanding and receptive to the available resources for possible intervention. Will provider available resources and strongly encouraged pt to contact if needed for further interventions   Pt verified his son would be picking him up from the hospital to go home upon discharge. Pt states he continues to drive and able to obtain all his medications and get to all his appointments. Pt states he lives in a single family home alone with no reported issues or problems. No further needs at this time.  TOC will continue to be available for any additional needs.   Expected Discharge Plan: Home/Self Care Barriers to Discharge: No Barriers Identified   Patient Goals and CMS Choice        Expected Discharge Plan and Services Expected Discharge Plan: Home/Self Care In-house Referral: Clinical Social Work     Living arrangements for the past 2 months: Single Family Home                                      Prior Living Arrangements/Services Living arrangements for the past 2 months: Single Family Home Lives with:: Self Patient language and need for interpreter reviewed:: Yes Do you feel safe going back to the place where you live?: Yes      Need for Family Participation in Patient Care: No (Comment) Care giver support system in place?: Yes (comment)   Criminal Activity/Legal Involvement Pertinent to Current Situation/Hospitalization: No - Comment as  needed  Activities of Daily Living Home Assistive Devices/Equipment: None ADL Screening (condition at time of admission) Patient's cognitive ability adequate to safely complete daily activities?: Yes Is the patient deaf or have difficulty hearing?: No Does the patient have difficulty seeing, even when wearing glasses/contacts?: No Does the patient have difficulty concentrating, remembering, or making decisions?: No Patient able to express need for assistance with ADLs?: Yes Does the patient have difficulty dressing or bathing?: No Independently performs ADLs?: Yes (appropriate for developmental age) Does the patient have difficulty walking or climbing stairs?: No Weakness of Legs: None Weakness of Arms/Hands: None  Permission Sought/Granted   Permission granted to share information with : Yes, Verbal Permission Granted              Emotional Assessment Appearance:: Appears stated age Attitude/Demeanor/Rapport: Engaged Affect (typically observed): Accepting Orientation: : Oriented to Self,Oriented to Place,Oriented to  Time,Oriented to Situation Alcohol / Substance Use: Other (comment) (TOC requested to consult for ASubstance/Abuse Counseling) Psych Involvement: No (comment)  Admission diagnosis:  Hypocalcemia [E83.51] Hypokalemia [E87.6] Hypomagnesemia [E83.42] Demand ischemia ([place]) [I24.8] Rapid atrial fibrillation ([place]) [I48.91] Atrial fibrillation with RVR ([place]) [I48.91] Patient Active Problem List   Diagnosis Date Noted  . Hypophosphatemia   . Hypocalcemia 12/27/2020  . Hypomagnesemia 12/27/2020  . Alcohol use disorder, moderate, dependence ([place]) 12/27/2020  . Acute gastroenteritis 12/27/2020  . Atrial fibrillation with RVR ([place]) 12/27/2020  .  Elevated troponin 12/27/2020  . Alcohol withdrawal syndrome with complication ([place])   . Dizziness 12/10/2020  . Irregular heart rhythm 12/10/2020  . AAA (abdominal aortic aneurysm) ([place]) 05/27/2020  . Transaminitis  05/22/2020  . Thiamine deficiency 04/28/2020  . Acute diarrhea 04/25/2020  . Alcohol abuse 04/25/2020  . Macrocytic anemia 04/19/2020  . Abnormal EKG 04/19/2020  . Proteinuria 04/19/2020  . Vitamin B12 deficiency 04/19/2020  . Vitamin D deficiency 04/19/2020  . Type 1 diabetes mellitus with hyperlipidemia ([place]) 04/19/2020  . Folate deficiency 04/19/2020  . Hyperparathyroidism ([place]) 04/19/2020  . Psychosocial stressors 04/27/2019  . Pedal edema 04/08/2017  . Abnormal thyroid function test 02/13/2017  . Hypokalemia 08/27/2015  . Encounter for general adult medical examination with abnormal findings 01/23/2014  . Cough 01/09/2014  . Nausea vomiting and diarrhea 01/09/2014  . Colloid thyroid nodule 05/05/2012  . Erectile dysfunction 04/19/2012  . Diabetes mellitus type 1, controlled ([place]) 10/22/2010  . Hyperlipidemia due to type 1 diabetes mellitus ([place]) 10/22/2010  . TOBACCO ABUSE 10/22/2010  . Essential hypertension 10/22/2010  . GERD 10/22/2010   PCP:  Ria Bush, MD Pharmacy:   [facility] [address], [place] [address] [place] [address] Phone: [phone] Fax: [phone]     Social Determinants of Health (SDOH) Interventions    Readmission Risk Interventions No flowsheet data found.

## 2020-12-28 NOTE — Progress Notes (Signed)
Pt had three episodes of vomiting

## 2020-12-29 LAB — GLUCOSE, CAPILLARY
Glucose-Capillary: 119 mg/dL — ABNORMAL HIGH (ref 70–99)
Glucose-Capillary: 150 mg/dL — ABNORMAL HIGH (ref 70–99)

## 2020-12-29 LAB — CBC
HCT: 33.3 % — ABNORMAL LOW (ref 39.0–52.0)
Hemoglobin: 11.7 g/dL — ABNORMAL LOW (ref 13.0–17.0)
MCH: 34.7 pg — ABNORMAL HIGH (ref 26.0–34.0)
MCHC: 35.1 g/dL (ref 30.0–36.0)
MCV: 98.8 fL (ref 80.0–100.0)
Platelets: 119 10*3/uL — ABNORMAL LOW (ref 150–400)
RBC: 3.37 MIL/uL — ABNORMAL LOW (ref 4.22–5.81)
RDW: 14.6 % (ref 11.5–15.5)
WBC: 8.5 10*3/uL (ref 4.0–10.5)
nRBC: 0 % (ref 0.0–0.2)

## 2020-12-29 LAB — BASIC METABOLIC PANEL
Anion gap: 7 (ref 5–15)
BUN: 11 mg/dL (ref 6–20)
CO2: 32 mmol/L (ref 22–32)
Calcium: 7 mg/dL — ABNORMAL LOW (ref 8.9–10.3)
Chloride: 94 mmol/L — ABNORMAL LOW (ref 98–111)
Creatinine, Ser: 0.73 mg/dL (ref 0.61–1.24)
GFR, Estimated: >60 mL/min (ref >60)
Glucose, Bld: 189 mg/dL — ABNORMAL HIGH (ref 70–99)
Potassium: 3.6 mmol/L (ref 3.5–5.1)
Sodium: 133 mmol/L — ABNORMAL LOW (ref 135–145)

## 2020-12-29 LAB — MAGNESIUM: Magnesium: 1.8 mg/dL (ref 1.7–2.4)

## 2020-12-29 LAB — PHOSPHORUS: Phosphorus: 1.5 mg/dL — ABNORMAL LOW (ref 2.5–4.6)

## 2020-12-29 MED ORDER — MAGNESIUM OXIDE 400 MG PO CAPS: 400.0000 mg | ORAL_CAPSULE | Freq: Every day | ORAL | 0 refills | Status: DC

## 2020-12-29 MED ORDER — APIXABAN 5 MG PO TABS: 5.0000 mg | ORAL_TABLET | Freq: Two times a day (BID) | ORAL | 0 refills | Status: DC

## 2020-12-29 MED ORDER — DILTIAZEM HCL ER COATED BEADS 240 MG PO CP24: 240.0000 mg | ORAL_CAPSULE | Freq: Every day | ORAL | 0 refills | Status: DC

## 2020-12-29 MED ORDER — METOCLOPRAMIDE HCL 10 MG PO TABS: 5.0000 mg | ORAL_TABLET | Freq: Three times a day (TID) | ORAL | Status: DC

## 2020-12-29 MED ORDER — POTASSIUM CHLORIDE 20 MEQ PO PACK: 20.0000 meq | PACK | Freq: Every day | ORAL | 0 refills | Status: DC

## 2020-12-29 MED ORDER — POTASSIUM & SODIUM PHOSPHATES 280-160-250 MG PO PACK
1.0000 | PACK | Freq: Three times a day (TID) | ORAL | Status: DC
  Administered 2020-12-29 (×2): 1 via ORAL
  Filled 2020-12-29 (×4): qty 1

## 2020-12-29 MED ORDER — THIAMINE HCL 100 MG PO TABS: 100.0000 mg | ORAL_TABLET | Freq: Every day | ORAL | 0 refills | Status: DC

## 2020-12-29 MED ORDER — POTASSIUM CHLORIDE 20 MEQ PO PACK
20.0000 meq | PACK | Freq: Two times a day (BID) | ORAL | Status: DC
  Administered 2020-12-29: 20 meq via ORAL
  Filled 2020-12-29: qty 1

## 2020-12-29 MED ORDER — METOCLOPRAMIDE HCL 10 MG PO TABS
5.0000 mg | ORAL_TABLET | Freq: Three times a day (TID) | ORAL | Status: DC
  Administered 2020-12-29 (×2): 5 mg via ORAL
  Filled 2020-12-29 (×2): qty 1

## 2020-12-29 MED ORDER — METOCLOPRAMIDE HCL 5 MG PO TABS: 5.0000 mg | ORAL_TABLET | Freq: Three times a day (TID) | ORAL | 0 refills | Status: DC

## 2020-12-29 MED ORDER — AMIODARONE HCL 200 MG PO TABS: 200.0000 mg | ORAL_TABLET | Freq: Two times a day (BID) | ORAL | 0 refills | Status: DC

## 2020-12-29 MED ORDER — POTASSIUM & SODIUM PHOSPHATES 280-160-250 MG PO PACK: 1.0000 | PACK | Freq: Three times a day (TID) | ORAL | 0 refills | Status: DC

## 2020-12-29 MED ORDER — ALBUTEROL SULFATE HFA 108 (90 BASE) MCG/ACT IN AERS
2.0000 | INHALATION_SPRAY | Freq: Four times a day (QID) | RESPIRATORY_TRACT | Status: DC
  Administered 2020-12-29: 2 via RESPIRATORY_TRACT
  Filled 2020-12-29: qty 6.7

## 2020-12-29 MED ORDER — PROAIR RESPICLICK 108 (90 BASE) MCG/ACT IN AEPB: 2.0000 | INHALATION_SPRAY | Freq: Two times a day (BID) | RESPIRATORY_TRACT | 0 refills | Status: DC

## 2020-12-29 MED ORDER — MAGNESIUM SULFATE 2 GM/50ML IV SOLN
2.0000 g | Freq: Once | INTRAVENOUS | Status: AC
  Administered 2020-12-29: 2 g via INTRAVENOUS
  Filled 2020-12-29: qty 50

## 2020-12-29 NOTE — Discharge Summary (Signed)
[place] [facility] [place] NAME: Russell Gomez    MR#:  [id number]  DATE OF BIRTH:  [birth year]  DATE OF ADMISSION:  12/26/2020 ADMITTING PHYSICIAN: Athena Masse, MD  DATE OF DISCHARGE: 12/29/2020  1:56 PM  PRIMARY CARE PHYSICIAN: Ria Bush, MD    ADMISSION DIAGNOSIS:  Hypocalcemia [E83.51] Hypokalemia [E87.6] Hypomagnesemia [E83.42] Demand ischemia ([place]) [I24.8] Rapid atrial fibrillation (HCC) [I48.91] Atrial fibrillation with RVR ([place]) [I48.91]  DISCHARGE DIAGNOSIS:  Principal Problem:   Atrial fibrillation with RVR (HCC) Active Problems:   Diabetes mellitus type 1, controlled ([place])   Essential hypertension   Nausea vomiting and diarrhea   Hypokalemia   Type 1 diabetes mellitus with hyperlipidemia (HCC)   Hypocalcemia   Hypomagnesemia   Alcohol use disorder, moderate, dependence (HCC)   Acute gastroenteritis   Elevated troponin   Alcohol withdrawal syndrome with complication ([place])   Hypophosphatemia   SECONDARY DIAGNOSIS:   Past Medical History:  Diagnosis Date  . Arthritis    on operative finger  . Dental crowns present   . Diabetes type 1, uncontrolled (HCC)    IDDM (Dr. Eddie Dibbles @ [facility])  . Esophageal reflux   . Goiter 04/2012   no current med, has yearly monitoring  . High cholesterol   . HTN (hypertension)    under control with meds, has been on med x 2 yr.    HOSPITAL COURSE:   1.  Rapid atrial fibrillation initially.  Atrial fibrillation is now paroxysmal in nature.  Patient initially was started on amiodarone drip and Cardizem drip and switched over to oral medications.  He converted over to normal sinus rhythm yesterday.  He is on oral metoprolol 50 mg twice a day Cardizem CD 240 mg twice a day and amiodarone.  I called the patient at home and told him to take the amiodarone once a day since he converted over to not normal sinus rhythm.  He is on Eliquis to reduce stroke risk.  Risk of bleeding explained to the  patient. 2.  Hypomagnesemia, hypokalemia and hypophosphatemia.  I replace magnesium IV during the hospital course potassium IV and orally and phosphorus orally.  Recommend rechecking electrolytes and follow-up appointment. 3.  Alcohol abuse.  The patient was on alcohol withdrawal protocol.  After the first day he did not require any medications.  Patient advised not to drink any further alcohol because of the atrial fibrillation and blood thinner. 4.  Type 1 diabetes mellitus with hyperlipidemia.  The patient takes NPH insulin at home and sliding scale.  His hemoglobin A1c is 7.4.  He did not want to be on the long-acting once a day insulin so he will go back on what he was taking at home. 5.  Nausea vomiting and diarrhea.  This has resolved.  The patient did have some nausea and vomiting last night and he thinks it was the meatloaf.  I did prescribe her Reglan just in case this is diabetic gastroparesis. 6.  Hyponatremia 7.  Thrombocytopenia likely secondary to alcohol.  Platelet count 119 upon disposition  DISCHARGE CONDITIONS:  Satisfactory  CONSULTS OBTAINED:  Cardiology  DRUG ALLERGIES:   Allergies  Allergen Reactions  . Insulin Aspart Other (See Comments)    CELLULITIS  . Spironolactone Nausea Only and Rash  . Hydrochlorothiazide Itching and Rash  . Morphine Other (See Comments)    "MAKES ME MEAN"    DISCHARGE MEDICATIONS:   Allergies as of 12/29/2020      Reactions  Insulin Aspart Other (See Comments)   CELLULITIS   Spironolactone Nausea Only, Rash   Hydrochlorothiazide Itching, Rash   Morphine Other (See Comments)   "MAKES ME MEAN"      Medication List    STOP taking these medications   aspirin 81 MG EC tablet   enalapril 20 MG tablet Commonly known as: VASOTEC   potassium chloride SA 20 MEQ tablet Commonly known as: KLOR-CON     TAKE these medications   Accu-Chek Guide w/Device Kit See admin instructions.   amiodarone 200 MG tablet Commonly known as:  PACERONE Take 1 tablet (200 mg total) by mouth 2 (two) times daily.   apixaban 5 MG Tabs tablet Commonly known as: ELIQUIS Take 1 tablet (5 mg total) by mouth 2 (two) times daily.   Azelastine HCl 137 MCG/SPRAY Soln Place 1 spray into both nostrils 2 (two) times daily.   diltiazem 240 MG 24 hr capsule Commonly known as: CARDIZEM CD Take 1 capsule (240 mg total) by mouth daily. Start taking on: December 30, 5398   folic acid 1 MG tablet Commonly known as: FOLVITE Take 1 tablet (1 mg total) by mouth daily.   furosemide 40 MG tablet Commonly known as: LASIX Take 1 tablet (40 mg total) by mouth daily.   glucose blood test strip Use 6 (six) times daily   hydrOXYzine 25 MG tablet Commonly known as: ATARAX/VISTARIL Take 0.5-1 tablets (12.5-25 mg total) by mouth 2 (two) times daily as needed for anxiety.   insulin lispro 100 UNIT/ML injection Commonly known as: HUMALOG 5-15 units before each meal based on carb intake and sugar   insulin regular 100 units/mL injection Commonly known as: NOVOLIN R Inject 5-9 Units into the skin as directed. Sliding scale as needed.   Magnesium Oxide 400 MG Caps Take 1 capsule (400 mg total) by mouth daily.   metoCLOPramide 5 MG tablet Commonly known as: REGLAN Take 1 tablet (5 mg total) by mouth 3 (three) times daily before meals.   metoprolol tartrate 50 MG tablet Commonly known as: LOPRESSOR Take 1 tablet by mouth twice daily   pantoprazole 40 MG tablet Commonly known as: PROTONIX Take 1 tablet (40 mg total) by mouth 2 (two) times daily before a meal.   potassium & sodium phosphates 280-160-250 MG Pack Commonly known as: PHOS-NAK Take 1 packet by mouth 4 (four) times daily -  with meals and at bedtime for 4 days. Okay to substitute what you have in pharmacy   potassium chloride 20 MEQ packet Commonly known as: KLOR-CON Take 20 mEq by mouth daily.   pravastatin 80 MG tablet Commonly known as: PRAVACHOL Take 1 tablet by mouth once  daily   ProAir RespiClick 867 (90 Base) MCG/ACT Aepb Generic drug: Albuterol Sulfate Inhale 2 puffs into the lungs in the morning and at bedtime.   sildenafil 100 MG tablet Commonly known as: Viagra Take 1 tablet (100 mg total) by mouth daily as needed for erectile dysfunction.   SM Coral Calcium 1000 (390 Ca) MG Tabs Generic drug: Coral Calcium Take 1 tablet by mouth daily.   thiamine 100 MG tablet Take 1 tablet (100 mg total) by mouth daily. Start taking on: December 30, 2020      Again patient was called to take the amiodarone once a day and not twice a day.  DISCHARGE INSTRUCTIONS:   Follow-up PMD 5 days Follow-up cardiology 1 week  If you experience worsening of your admission symptoms, develop shortness of breath, life threatening emergency,  suicidal or homicidal thoughts you must seek medical attention immediately by calling 911 or calling your MD immediately  if symptoms less severe.  You Must read complete instructions/literature along with all the possible adverse reactions/side effects for all the Medicines you take and that have been prescribed to you. Take any new Medicines after you have completely understood and accept all the possible adverse reactions/side effects.   Please note  You were cared for by a hospitalist during your hospital stay. If you have any questions about your discharge medications or the care you received while you were in the hospital after you are discharged, you can call the unit and asked to speak with the hospitalist on call if the hospitalist that took care of you is not available. Once you are discharged, your primary care physician will handle any further medical issues. Please note that NO REFILLS for any discharge medications will be authorized once you are discharged, as it is imperative that you return to your primary care physician (or establish a relationship with a primary care physician if you do not have one) for your aftercare  needs so that they can reassess your need for medications and monitor your lab values.    Today   CHIEF COMPLAINT:   Chief Complaint  Patient presents with  . Emesis    HISTORY OF PRESENT ILLNESS:  Russell Gomez  is a 60 y.o. male came in with nausea vomiting diarrhea   VITAL SIGNS:  Blood pressure 124/69, pulse 66, temperature 98.3 F (36.8 C), temperature source Oral, resp. rate 18, height 6' 2"  (1.88 m), weight 100 kg, SpO2 97 %.  I/O:    Intake/Output Summary (Last 24 hours) at 12/29/2020 1805 Last data filed at 12/28/2020 2221 Gross per 24 hour  Intake -  Output 1000 ml  Net -1000 ml    PHYSICAL EXAMINATION:  GENERAL:  60 y.o.-year-old patient lying in the bed with no acute distress.  EYES: Pupils equal, round, reactive to light and accommodation. No scleral icterus. Extraocular muscles intact.  HEENT: Head atraumatic, normocephalic. Oropharynx and nasopharynx clear.  LUNGS: Normal breath sounds bilaterally, no wheezing, rales,rhonchi or crepitation. No use of accessory muscles of respiration.  CARDIOVASCULAR: S1, S2 normal. No murmurs, rubs, or gallops.  ABDOMEN: Soft, non-tender.  EXTREMITIES: No pedal edema.  NEUROLOGIC: Cranial nerves II through XII are intact. Muscle strength 5/5 in all extremities. Sensation intact. Gait not checked.  PSYCHIATRIC: The patient is alert and oriented x 3.  SKIN: No obvious rash, lesion, or ulcer.   DATA REVIEW:   CBC Recent Labs  Lab 12/29/20 0440  WBC 8.5  HGB 11.7*  HCT 33.3*  PLT 119*    Chemistries  Recent Labs  Lab 12/28/20 0529 12/29/20 0440  NA 135 133*  K 2.8* 3.6  CL 93* 94*  CO2 31 32  GLUCOSE 93 189*  BUN 7 11  CREATININE 0.70 0.73  CALCIUM 6.7* 7.0*  MG 1.8 1.8  AST 51*  --   ALT 25  --   ALKPHOS 197*  --   BILITOT 1.7*  --     Microbiology Results  Results for orders placed or performed during the hospital encounter of 12/26/20  Resp Panel by RT-PCR (Flu A&B, Covid) Nasopharyngeal Swab      Status: None   Collection Time: 12/26/20 10:57 PM   Specimen: Nasopharyngeal Swab; Nasopharyngeal(NP) swabs in vial transport medium  Result Value Ref Range Status   SARS Coronavirus 2 by RT PCR NEGATIVE  NEGATIVE Final    Comment: (NOTE) SARS-CoV-2 target nucleic acids are NOT DETECTED.  The SARS-CoV-2 RNA is generally detectable in upper respiratory specimens during the acute phase of infection. The lowest concentration of SARS-CoV-2 viral copies this assay can detect is 138 copies/mL. A negative result does not preclude SARS-Cov-2 infection and should not be used as the sole basis for treatment or other patient management decisions. A negative result may occur with  improper specimen collection/handling, submission of specimen other than nasopharyngeal swab, presence of viral mutation(s) within the areas targeted by this assay, and inadequate number of viral copies(<138 copies/mL). A negative result must be combined with clinical observations, patient history, and epidemiological information. The expected result is Negative.  Fact Sheet for Patients:  EntrepreneurPulse.com.au  Fact Sheet for Healthcare Providers:  IncredibleEmployment.be  This test is no t yet approved or cleared by the [place] FDA and  has been authorized for detection and/or diagnosis of SARS-CoV-2 by FDA under an Emergency Use Authorization (EUA). This EUA will remain  in effect (meaning this test can be used) for the duration of the COVID-19 declaration under Section 564(b)(1) of the Act, 21 U.S.C.section 360bbb-3(b)(1), unless the authorization is terminated  or revoked sooner.       Influenza A by PCR NEGATIVE NEGATIVE Final   Influenza B by PCR NEGATIVE NEGATIVE Final    Comment: (NOTE) The Xpert Xpress SARS-CoV-2/FLU/RSV plus assay is intended as an aid in the diagnosis of influenza from Nasopharyngeal swab specimens and should not be used as a sole basis  for treatment. Nasal washings and aspirates are unacceptable for Xpert Xpress SARS-CoV-2/FLU/RSV testing.  Fact Sheet for Patients: EntrepreneurPulse.com.au  Fact Sheet for Healthcare Providers: IncredibleEmployment.be  This test is not yet approved or cleared by the [place] FDA and has been authorized for detection and/or diagnosis of SARS-CoV-2 by FDA under an Emergency Use Authorization (EUA). This EUA will remain in effect (meaning this test can be used) for the duration of the COVID-19 declaration under Section 564(b)(1) of the Act, 21 U.S.C. section 360bbb-3(b)(1), unless the authorization is terminated or revoked.  Performed at [facility], [address], [place], [place] [address]      Management plans discussed with the patient, and he is  in agreement.  CODE STATUS:     Code Status Orders  (From admission, onward)         Start     Ordered   12/27/20 0143  Full code  Continuous        12/27/20 0149        Code Status History    Date Active Date Inactive Code Status Order ID Comments User Context   04/01/2018 0151 04/01/2018 2033 Full Code [id number]  Amelia Jo, MD ED   05/12/2016 1207 05/12/2016 1546 Full Code [id number]  Poggi, Marshall Cork, MD Inpatient   Advance Care Planning Activity      TOTAL TIME TAKING CARE OF THIS PATIENT: 34 minutes.    Loletha Grayer M.D on 12/29/2020 at 6:05 PM  Between 7am to 6pm - Pager - [phone]  After 6pm go to [email or web address] - password [email or web address]  [facility]  CC: Primary care physician; Ria Bush, MD

## 2020-12-29 NOTE — Plan of Care (Signed)
  Problem: Health Behavior/Discharge Planning: Goal: Ability to manage health-related needs will improve Outcome: Progressing   Problem: Clinical Measurements: Goal: Ability to maintain clinical measurements within normal limits will improve Outcome: Progressing   Problem: Education: Goal: Knowledge of disease or condition will improve Outcome: Progressing Goal: Understanding of medication regimen will improve Outcome: Progressing   Problem: Cardiac: Goal: Ability to achieve and maintain adequate cardiopulmonary perfusion will improve Outcome: Progressing

## 2020-12-29 NOTE — Progress Notes (Signed)
Inpatient Diabetes Program Recommendations  AACE/ADA: New Consensus Statement on Inpatient Glycemic Control (2015)  Target Ranges:  Prepandial:   less than 140 mg/dL      Peak postprandial:   less than 180 mg/dL (1-2 hours)      Critically ill patients:  140 - 180 mg/dL   Results for Russell Gomez, Russell Gomez (MRN [id number]) as of 12/29/2020 07:26  Ref. Range 12/27/2020 07:43 12/27/2020 11:30 12/27/2020 16:21 12/27/2020 18:22 12/27/2020 20:20 12/27/2020 22:17  Glucose-Capillary Latest Ref Range: 70 - 99 mg/dL 146 (H)  2 units NOVOLOG  164 (H)  3 units NOVOLOG  271 (H) 419 (H)  9 units NOVOLOG  273 (H) 261 (H)  3 units NOVOLOG  8 units LEVEMIR   Results for Russell Gomez, Russell Gomez (MRN [id number]) as of 12/29/2020 07:26  Ref. Range 12/28/2020 08:10 12/28/2020 10:12 12/28/2020 11:53 12/28/2020 16:12 12/28/2020 20:43  Glucose-Capillary Latest Ref Range: 70 - 99 mg/dL 60 (L) 189 (H) 220 (H)  4 units NOVOLOG  348 (H)  9 units NOVOLOG  259 (H)  3 units NOVOLOG  8 units LEVEMIR   Results for Russell Gomez, Russell Gomez (MRN [id number]) as of 12/29/2020 12:34  Ref. Range 12/29/2020 07:46 12/29/2020 12:05  Glucose-Capillary Latest Ref Range: 70 - 99 mg/dL 119 (H) 150 (H)    Admit with: 2-day history of vomiting and diarrhea and generalized malaise/ Rapid atrial fibrillation, new onset/ Acute gastroenteritis  History: Type 1 Diabetes, ETOH Abuse  Home DM Meds: Humalog 5-15 units TID with meals (based on carb intake and sugar)       Basaglar 18 units QHS       NPH Insulin 9 units BID??  Current Orders: Levemir 8 units QHS      Novolog Sensitive Correction Scale/ SSI (0-9 units) TID AC + HS      Novolog 2 units TID with meals     CBGs better so far today.  Will follow during hospitalization    --Will follow patient during hospitalization--  Wyn Quaker RN, MSN, CDE Diabetes Coordinator Inpatient Glycemic Control Team Team Pager: [phone] (8a-5p)

## 2020-12-29 NOTE — Progress Notes (Signed)
[facility] Encounter Note  Patient: Russell Gomez / Admit Date: 12/26/2020 / Date of Encounter: 12/29/2020, 8:16 AM   Subjective: 2/20 patient has had significant improvements in symptoms of weakness fatigue shortness of breath and somewhat delirium.  The patient does have a better heart rate control of atrial fibrillation with combination of medication management but currently he has not had spontaneous conversion to normal sinus rhythm.  There has been no evidence of congestive heart failure or acute coronary syndrome at this time  2/21.  Patient feeling much better since admission and has had no significant symptoms of chest pain shortness of breath heart failure type symptoms.  Patient has had good control of his atrial fibrillation and appears to have spontaneously converted to normal sinus rhythm by telemetry.  Patient does have sinus rhythm with frequent preatrial and preventricular contractions.  Patient tolerating all medication management necessary for treatment  Echocardiogram showing normal LV systolic function with ejection fraction 55 to 60% with no evidence of significant valvular heart disease  Review of Systems: Positive for: None Negative for: Vision change, hearing change, syncope, dizziness, nausea, vomiting,diarrhea, bloody stool, stomach pain, cough, congestion, diaphoresis, urinary frequency, urinary pain,skin lesions, skin rashes Others previously listed  Objective: Telemetry: Normal sinus rhythm with preatrial and preventricular contractions Physical Exam: Blood pressure 137/82, pulse 74, temperature 98.3 F (36.8 C), temperature source Oral, resp. rate 18, height 6\' 2"  (1.88 m), weight 100 kg, SpO2 93 %. Body mass index is 28.31 kg/m. General: Well developed, well nourished, in no acute distress. Head: Normocephalic, atraumatic, sclera non-icteric, no xanthomas, nares are without discharge. Neck: No apparent masses Lungs: Normal respirations  with no wheezes, no rhonchi, no rales , no crackles   Heart: Irregular rate and rhythm, normal S1 S2, no murmur, no rub, no gallop, PMI is normal size and placement, carotid upstroke normal without bruit, jugular venous pressure normal Abdomen: Soft, non-tender, non-distended with normoactive bowel sounds. No hepatosplenomegaly. Abdominal aorta is normal size without bruit Extremities: No edema, no clubbing, no cyanosis, no ulcers,  Peripheral: 2+ radial, 2+ femoral, 2+ dorsal pedal pulses Neuro: Alert and oriented. Moves all extremities spontaneously. Psych:  Responds to questions appropriately with a normal affect.   Intake/Output Summary (Last 24 hours) at 12/29/2020 0816 Last data filed at 12/28/2020 2221 Gross per 24 hour  Intake --  Output 1000 ml  Net -1000 ml    Inpatient Medications:  . albuterol  2 puff Inhalation QID  . amiodarone  200 mg Oral BID  . apixaban  5 mg Oral BID  . diltiazem  240 mg Oral Daily  . folic acid  1 mg Oral Daily  . insulin aspart  0-5 Units Subcutaneous QHS  . insulin aspart  0-9 Units Subcutaneous TID WC  . insulin aspart  2 Units Subcutaneous TID WC  . insulin detemir  8 Units Subcutaneous QHS  . LORazepam  0-4 mg Intravenous Q12H  . metoCLOPramide  5 mg Oral TID AC  . metoprolol tartrate  50 mg Oral BID  . multivitamin with minerals  1 tablet Oral Daily  . potassium & sodium phosphates  1 packet Oral TID WC & HS  . potassium chloride  20 mEq Oral BID  . thiamine  100 mg Oral Daily   Or  . thiamine  100 mg Intravenous Daily   Infusions:  . magnesium sulfate bolus IVPB      Labs: Recent Labs    12/28/20 0529 12/29/20 0440  NA  135 133*  K 2.8* 3.6  CL 93* 94*  CO2 31 32  GLUCOSE 93 189*  BUN 7 11  CREATININE 0.70 0.73  CALCIUM 6.7* 7.0*  MG 1.8 1.8  PHOS 2.1* 1.5*   Recent Labs    12/26/20 2257 12/28/20 0529  AST 91* 51*  ALT 35 25  ALKPHOS 204* 197*  BILITOT 1.2 1.7*  PROT 5.9* 5.6*  ALBUMIN 2.8* 2.4*   Recent Labs     12/26/20 2257 12/27/20 0521 12/28/20 0529 12/29/20 0440  WBC 10.9*   < > 11.4* 8.5  NEUTROABS 8.4*  --   --   --   HGB 15.0   < > 13.1 11.7*  HCT 42.5   < > 36.3* 33.3*  MCV 97.7   < > 97.6 98.8  PLT 196   < > 133* 119*   < > = values in this interval not displayed.   No results for input(s): CKTOTAL, CKMB, TROPONINI in the last 72 hours. Invalid input(s): POCBNP Recent Labs    12/27/20 0521  HGBA1C 7.4*     Weights: Filed Weights   12/28/20 0100 12/28/20 0318 12/29/20 0411  Weight: 96.5 kg 98.3 kg 100 kg     Radiology/Studies:  DG Chest Portable 1 View  Result Date: 12/26/2020 CLINICAL DATA:  Nausea and vomiting x3 days. EXAM: PORTABLE CHEST 1 VIEW COMPARISON:  April 16, 2020 FINDINGS: The heart size and mediastinal contours are within normal limits. There is mild calcification of the aortic arch. Both lungs are clear. The visualized skeletal structures are unremarkable. IMPRESSION: No active disease. Electronically Signed   By: Virgina Norfolk M.D.   On: 12/26/2020 23:33   ECHOCARDIOGRAM COMPLETE  Result Date: 12/28/2020    ECHOCARDIOGRAM REPORT   Patient Name:   Russell Gomez Date of Exam: 12/27/2020 Medical Rec #:  [id number]      Height:       74.0 in Accession #:    [id number]     Weight:       210.0 lb Date of Birth:  [birth year]     BSA:          2.219 m Patient Age:    60 years       BP:           133/67 mmHg Patient Gender: M              HR:           116 bpm. Exam Location:  [facility] Procedure: Cardiac Doppler, Color Doppler and 2D Echo Indications:     Atrial Fibrillation I48.91  History:         Patient has no prior history of Echocardiogram examinations.                  Risk Factors:Hypertension and Diabetes.  Sonographer:     Alyse Low Roar Referring Phys:  [id number] Athena Masse Diagnosing Phys: Serafina Royals MD IMPRESSIONS  1. Left ventricular ejection fraction, by estimation, is 60 to 65%. The left ventricle has normal function. The left ventricle has no regional  wall motion abnormalities. Left ventricular diastolic parameters were normal.  2. Right ventricular systolic function is normal. The right ventricular size is normal.  3. Left atrial size was mildly dilated.  4. The mitral valve is normal in structure. Mild to moderate mitral valve regurgitation.  5. The aortic valve is normal in structure. Aortic valve regurgitation is not visualized. FINDINGS  Left Ventricle: Left ventricular ejection  fraction, by estimation, is 60 to 65%. The left ventricle has normal function. The left ventricle has no regional wall motion abnormalities. The left ventricular internal cavity size was normal in size. There is  no left ventricular hypertrophy. Left ventricular diastolic parameters were normal. Right Ventricle: The right ventricular size is normal. No increase in right ventricular wall thickness. Right ventricular systolic function is normal. Left Atrium: Left atrial size was mildly dilated. Right Atrium: Right atrial size was normal in size. Pericardium: There is no evidence of pericardial effusion. Mitral Valve: The mitral valve is normal in structure. Mild to moderate mitral valve regurgitation. Tricuspid Valve: The tricuspid valve is normal in structure. Tricuspid valve regurgitation is trivial. Aortic Valve: The aortic valve is normal in structure. Aortic valve regurgitation is not visualized. Aortic valve peak gradient measures 11.7 mmHg. Pulmonic Valve: The pulmonic valve was normal in structure. Pulmonic valve regurgitation is not visualized. Aorta: The aortic root and ascending aorta are structurally normal, with no evidence of dilitation. IAS/Shunts: No atrial level shunt detected by color flow Doppler.  LEFT VENTRICLE PLAX 2D LVIDd:         5.39 cm  Diastology LVIDs:         4.55 cm  LV e' medial:    6.53 cm/s LV PW:         1.25 cm  LV E/e' medial:  8.3 LV IVS:        1.33 cm  LV e' lateral:   8.92 cm/s LVOT diam:     2.20 cm  LV E/e' lateral: 6.1 LVOT Area:     3.80 cm   RIGHT VENTRICLE RV Mid diam:    3.49 cm RV S prime:     17.40 cm/s TAPSE (M-mode): 2.2 cm LEFT ATRIUM             Index       RIGHT ATRIUM           Index LA diam:        4.60 cm 2.07 cm/m  RA Area:     16.50 cm LA Vol (A2C):   66.8 ml 30.10 ml/m RA Volume:   43.40 ml  19.56 ml/m LA Vol (A4C):   84.7 ml 38.17 ml/m LA Biplane Vol: 79.5 ml 35.83 ml/m  AORTIC VALVE                PULMONIC VALVE AV Area (Vmax): 2.18 cm    PV Vmax:        0.99 m/s AV Vmax:        171.00 cm/s PV Peak grad:   3.9 mmHg AV Peak Grad:   11.7 mmHg   RVOT Peak grad: 2 mmHg LVOT Vmax:      98.10 cm/s  AORTA Ao Root diam: 3.50 cm MITRAL VALVE MV Area (PHT): 3.83 cm    SHUNTS MV Decel Time: 198 msec    Systemic Diam: 2.20 cm MV E velocity: 54.00 cm/s MV A velocity: 78.00 cm/s MV E/A ratio:  0.69 MV A Prime:    12.0 cm/s Serafina Royals MD Electronically signed by Serafina Royals MD Signature Date/Time: 12/28/2020/5:54:56 AM    Final      Assessment and Recommendation  60 y.o. male with diabetes hypertension with acute atrial fibrillation with rapid ventricular rate without evidence of congestive heart failure or myocardial infarction or acute coronary syndrome 1.  Continue amiodarone and transition to oral amiodarone 200 mg once per day for now after cardioversion to normal sinus  rhythm with 19 is of normal rhythm 2.  Eliquis 5 mg twice per day for further risk reduction in stroke with atrial fibrillation as outpatient for minimum of 1 month 3.  Heart rate control of atrial fibrillation and is of normal sinus rhythm with diltiazem metoprolol combination 4.  No further cardiac diagnostics necessary at this time 5.  If patient ambulating well with good heart rate control as per above okay for discharge to home with further treatment options after above as outpatient 6.  Call if further questions  Signed, Serafina Royals M.D. FACC

## 2020-12-29 NOTE — Discharge Instructions (Signed)

## 2020-12-30 NOTE — Telephone Encounter (Signed)
Patient called stating that his insurance company is not going to cover his PPI medication. Patient stated while he was in the hospital they changed most of his medications. Patient stated that he thinks his pantoprazole was changed to Omeprazole. Patient stated that the PPI is going to cost him over a $100. Patient stated that he is seeing the cardiologist tomorrow and will see if they are going to change medications again. Patient wants to know what he can be given that will cost him less money.

## 2020-12-30 NOTE — Progress Notes (Signed)
Cardiology Office Note  Date:  12/31/2020   ID:  Russell Gomez, DOB [birth year], MRN [id number]  PCP:  Ria Bush, MD   Chief Complaint  Patient presents with  . New Patient (Initial Visit)    Referred by Dr. Danise Mina for Abnormal EKG    HPI:  [name] is a 60 year old gentleman with past medical history of hypertension  hyperlipidemia  Diabetes alcohol abuse  Hospitalization December 27, 2020 for atrial fibrillation Presents to establish care in the [place] office, follow-up of his atrial fibrillation  EKG in the hospital 2/190/2022 c/o nausea and vomiting that began 3 days ago That day with 10 episodes of nonbloody nonbilious emesis. Was told there was blood at home when throwing up Potassium 3.1 Noted to be in atrial fibrillation with RVR Treated with amiodarone infusion, diltiazem infusion Treated for alcohol withdrawal in the hospital Converted to normal sinus rhythm in the hospital  Discharged on metoprolol, Cardizem CD with amiodarone Also on Eliquis  In follow-up today reports he is not on Cardizem, does not have amiodarone or Eliquis He is taking metoprolol tartrate 50 twice daily He does have a coupon for Eliquis, plans on getting this filled.  Unclear why he does not have his other medications  Reports that he feels relatively well, denies any tachypalpitations Etiology of the bilious vomiting that led to his hospital admission is unclear Possibly from alcohol  Lab work reviewed Hemoglobin A1c 7.4 On insulin at home  EKG personally reviewed by myself on todays visit Shows normal sinus rhythm with rate 99 bpm PACs   PMH:   has a past medical history of Arthritis, Dental crowns present, Diabetes type 1, uncontrolled ([place]), Esophageal reflux, Goiter (04/2012), High cholesterol, and HTN (hypertension).  PSH:    Past Surgical History:  Procedure Laterality Date  . DISTAL INTERPHALANGEAL JOINT FUSION Right 03/05/2014   Procedure:  DEBRIDEMENT (DIP) DISTAL INTERPHALANGEAL RIGHT MIDDLE FINGER;  Surgeon: Wynonia Sours, MD;  Location: [facility];  Service: Orthopedics;  Laterality: Right;  . KNEE ARTHROSCOPY    . MASS EXCISION Right 03/05/2014   Procedure: EXCISION CYST ;  Surgeon: Wynonia Sours, MD;  Location: [facility];  Service: Orthopedics;  Laterality: Right;  ANESTHESIA: IV REGINAL FAB  . OPEN REDUCTION NASAL FRACTURE  12/26/2008   with closure of nasal lac.  Marland Kitchen ORIF DISTAL RADIUS FRACTURE Right 12/26/2008  . ORIF WRIST FRACTURE Left 05/12/2016   takedown of nonunion/malunion and OPEN REDUCTION INTERNAL FIXATION (ORIF) WRIST FRACTURE;  Surgeon: Corky Mull, MD  . PERCUTANEOUS PINNING Left 02/06/2013   Procedure: PINNING PIP OF THE LEFT MIDDLE FINGER ;  Surgeon: Wynonia Sours, MD;  Location: [facility];  Service: Orthopedics;  Laterality: Left;  . SKIN GRAFT SPLIT THICKNESS LEG / FOOT Right 1985   thigh after trauma vs machine at work  . TRIGGER FINGER RELEASE  11/28/2012   Procedure: RELEASE TRIGGER FINGER/A-1 PULLEY;  Surgeon: Wynonia Sours, MD;  Laterality: Left;  EXCISION MASS LEFT RING FINGER, RELEASE A-1 PULLEY LEFT RING FINGER (ganglion cyst)    Current Outpatient Medications  Medication Sig Dispense Refill  . Albuterol Sulfate (PROAIR RESPICLICK) 093 (90 Base) MCG/ACT AEPB Inhale 2 puffs into the lungs in the morning and at bedtime. 1 each 0  . Blood Glucose Monitoring Suppl (ACCU-CHEK GUIDE) w/Device KIT See admin instructions.    . Coral Calcium (SM CORAL CALCIUM) 1000 (390 Ca) MG TABS Take 1 tablet by mouth daily.  60 tablet   . folic acid (FOLVITE) 1 MG tablet Take 1 tablet (1 mg total) by mouth daily. 30 tablet 11  . furosemide (LASIX) 40 MG tablet Take 1 tablet (40 mg total) by mouth daily. 30 tablet 3  . glucose blood test strip Use 6 (six) times daily    . hydrOXYzine (ATARAX/VISTARIL) 25 MG tablet Take 0.5-1 tablets (12.5-25 mg total) by mouth 2 (two) times daily as  needed for anxiety. 30 tablet 0  . insulin lispro (HUMALOG) 100 UNIT/ML injection 5-15 units before each meal based on carb intake and sugar    . insulin regular (NOVOLIN R) 100 units/mL injection Inject 5-9 Units into the skin as directed. Sliding scale as needed.    . metoprolol tartrate (LOPRESSOR) 100 MG tablet Take 1 tablet (100 mg total) by mouth 2 (two) times daily. 60 tablet 6  . pantoprazole (PROTONIX) 40 MG tablet Take 1 tablet (40 mg total) by mouth 2 (two) times daily before a meal. 180 tablet 1  . potassium chloride (KLOR-CON) 20 MEQ packet Take 20 mEq by mouth daily. 30 packet 0  . pravastatin (PRAVACHOL) 80 MG tablet Take 1 tablet by mouth once daily 90 tablet 2  . apixaban (ELIQUIS) 5 MG TABS tablet Take 1 tablet (5 mg total) by mouth 2 (two) times daily. (Patient not taking: Reported on 12/31/2020) 60 tablet 0   No current facility-administered medications for this visit.     Allergies:   Insulin aspart, Spironolactone, Hydrochlorothiazide, and Morphine   Social History:  The patient  reports that he has been smoking cigarettes. He has a 20.00 pack-year smoking history. He has never used smokeless tobacco. He reports current alcohol use of about 7.0 standard drinks of alcohol per week. He reports that he does not use drugs.   Family History:   family history includes Coronary artery disease in his maternal uncle; Diabetes in his maternal uncle, paternal grandmother, and paternal uncle; Healthy in his father; Hyperlipidemia in his mother; Hypertension in his mother; Stroke in his maternal aunt.    Review of Systems: Review of Systems  Constitutional: Negative.   HENT: Negative.   Respiratory: Negative.   Cardiovascular: Negative.   Gastrointestinal: Negative.   Musculoskeletal: Negative.   Neurological: Negative.   Psychiatric/Behavioral: Negative.   All other systems reviewed and are negative.   PHYSICAL EXAM: VS:  BP 138/76   Pulse 99   Ht _0  (1.88 m)   Wt 212  lb (96.2 kg)   BMI 27.22 kg/m  , BMI Body mass index is 27.22 kg/m. GEN: Well nourished, well developed, in no acute distress HEENT: normal Neck: no JVD, carotid bruits, or masses Cardiac: RRR; no murmurs, rubs, or gallops,no edema  Respiratory:  clear to auscultation bilaterally, normal work of breathing GI: soft, nontender, nondistended, + BS MS: no deformity or atrophy Skin: warm and dry, no rash Neuro:  Strength and sensation are intact Psych: euthymic mood, full affect    Recent Labs: 12/10/2020: TSH 0.08 12/28/2020: ALT 25 12/29/2020: BUN 11; Creatinine, Ser 0.73; Hemoglobin 11.7; Magnesium 1.8; Platelets 119; Potassium 3.6; Sodium 133    Lipid Panel Lab Results  Component Value Date   CHOL 207 (H) 04/16/2020   HDL 134.80 04/16/2020   LDLCALC 36 04/16/2020   TRIG 181.0 (H) 04/16/2020      Wt Readings from Last 3 Encounters:  12/31/20 212 lb (96.2 kg)  12/29/20 220 lb 7.4 oz (100 kg)  12/10/20 220 lb 2 oz (99.8  kg)       ASSESSMENT AND PLAN:  Problem List Items Addressed This Visit      Cardiology Problems   Atrial fibrillation with RVR (HCC) - Primary   Relevant Medications   metoprolol tartrate (LOPRESSOR) 100 MG tablet   Other Relevant Orders   EKG 12-Lead   Essential hypertension   Relevant Medications   metoprolol tartrate (LOPRESSOR) 100 MG tablet   Other Relevant Orders   EKG 12-Lead   Type 1 diabetes mellitus with hyperlipidemia (HCC)   Relevant Medications   metoprolol tartrate (LOPRESSOR) 100 MG tablet   Hyperlipidemia due to type 1 diabetes mellitus (HCC)   Relevant Medications   metoprolol tartrate (LOPRESSOR) 100 MG tablet     Other   Alcohol use disorder, moderate, dependence (HCC)   TOBACCO ABUSE     Atrial fibrillation with RVR In the setting of bilious vomiting, alcohol abuse Alcohol cessation recommended, Currently not on his amiodarone, diltiazem, Eliquis Discussed risk and benefit of anticoagulation, CHADS VASC 2 (diabetes,  hypertension) He will fill the Eliquis, start 5 twice daily, Significant ecchymotic bruising noted on exam today, We will see if he can tolerate Eliquis Recommended that he watch for hematemesis, GI bleeds, excessive bruising -For rate and rhythm control recommended we increase metoprolol to heart rate up to 100 twice daily Given polypharmacy, currently not on several of his medications that were prescribed at discharge, will try to keep his medication list smaller  Diabetes Managed by primary care Hemoglobin A1c 7.4  Smoker We have encouraged him to continue to work on weaning his cigarettes and smoking cessation. He will continue to work on this and does not want any assistance with chantix.   Essential hypertension Increase metoprolol as above, Was previously on enalapril, this was held during recent hospitalization We will continue to hold Was discharged on diltiazem extended release, did not start this, will continue to hold    Total encounter time more than 60 minutes  Greater than 50% was spent in counseling and coordination of care with the patient    Signed, Esmond Plants, M.D., Ph.D. [place], [place]

## 2020-12-31 ENCOUNTER — Ambulatory Visit (INDEPENDENT_AMBULATORY_CARE_PROVIDER_SITE_OTHER): Payer: BC Managed Care – PPO | Admitting: Cardiovascular Disease

## 2020-12-31 ENCOUNTER — Other Ambulatory Visit: Payer: Self-pay

## 2020-12-31 ENCOUNTER — Encounter: Payer: Self-pay | Admitting: Cardiovascular Disease

## 2020-12-31 VITALS — BP 138/76 | HR 99 | Ht 74.0 in | Wt 212.0 lb

## 2020-12-31 DIAGNOSIS — E785 Hyperlipidemia, unspecified: Secondary | ICD-10-CM

## 2020-12-31 DIAGNOSIS — F1020 Alcohol dependence, uncomplicated: Secondary | ICD-10-CM

## 2020-12-31 DIAGNOSIS — I10 Essential (primary) hypertension: Secondary | ICD-10-CM | POA: Diagnosis not present

## 2020-12-31 DIAGNOSIS — E1069 Type 1 diabetes mellitus with other specified complication: Secondary | ICD-10-CM

## 2020-12-31 DIAGNOSIS — F17200 Nicotine dependence, unspecified, uncomplicated: Secondary | ICD-10-CM

## 2020-12-31 DIAGNOSIS — I4891 Unspecified atrial fibrillation: Secondary | ICD-10-CM

## 2020-12-31 MED ORDER — METOPROLOL TARTRATE 100 MG PO TABS: 100.0000 mg | ORAL_TABLET | Freq: Two times a day (BID) | ORAL | 6 refills | Status: DC

## 2020-12-31 NOTE — Patient Instructions (Addendum)
Medication Instructions:  Please increase the metoprolol up to 100 mg twice a day (up from 50 twice  A day)  eliquis 5 twice a day  Stop amiodarone Stop diltiazem   If you need a refill on your cardiac medications before your next appointment, please call your pharmacy.    Lab work: No new labs needed   If you have labs (blood work) drawn today and your tests are completely normal, you will receive your results only by: Marland Kitchen MyChart Message (if you have MyChart) OR . A paper copy in the mail If you have any lab test that is abnormal or we need to change your treatment, we will call you to review the results.   Testing/Procedures: No new testing needed   Follow-Up: At [facility], you and your health needs are our priority.  As part of our continuing mission to provide you with exceptional heart care, we have created designated Provider Care Teams.  These Care Teams include your primary Cardiologist (physician) and Advanced Practice Providers (APPs -  Physician Assistants and Nurse Practitioners) who all work together to provide you with the care you need, when you need it.  . You will need a follow up appointment in 6 months  . Providers on your designated Care Team:   . Murray Hodgkins, NP . Christell Faith, PA-C . Marrianne Mood, PA-C  Any Other Special Instructions Will Be Listed Below (If Applicable).  COVID-19 Vaccine Information can be found at: ShippingScam.co.uk For questions related to vaccine distribution or appointments, please email vaccine@[email or web address] .[email or web address] or call [phone].

## 2021-01-01 MED ORDER — OMEPRAZOLE 40 MG PO CPDR: 40.0000 mg | DELAYED_RELEASE_CAPSULE | Freq: Two times a day (BID) | ORAL | 3 refills | Status: DC

## 2021-01-01 NOTE — Telephone Encounter (Addendum)
Please notify I have resent in omeprazole to take 40mg  twice daily to price out. Please fill PA if needed - indication severe GERD with bilious vomiting, failed lower dose OTC omeprazole

## 2021-01-01 NOTE — Addendum Note (Signed)
Addended by: Ria Bush on: 01/01/2021 11:02 AM   Modules accepted: Orders

## 2021-01-04 ENCOUNTER — Other Ambulatory Visit: Payer: Self-pay | Admitting: Family Medicine

## 2021-01-05 NOTE — Telephone Encounter (Signed)
Submitted PA for omeprazole 40 mg cap; key:  [id number].  Decision pending.

## 2021-01-06 ENCOUNTER — Ambulatory Visit: Payer: BC Managed Care – PPO | Admitting: Family Medicine

## 2021-01-06 NOTE — Telephone Encounter (Signed)
Received faxed PA denial for omeprazole 40 mg cap.  Reason:  The requested service is not a covered benefit per pt's benefit booklet or plan documents.  FYI to Dr. Darnell Level.

## 2021-01-06 NOTE — Telephone Encounter (Signed)
Please resubmit PA for pantoprazole 40mg  bid.  He cannot take any other recommended generic OTC PPIs as they are not effecting to manage symptoms, he needs higher dose given severe GERD.

## 2021-01-06 NOTE — Telephone Encounter (Signed)
Resubmitted PA for pantoprazole 40 mg tab; key:  [id number].  Decision pending.

## 2021-01-06 NOTE — Addendum Note (Signed)
Addended by: Ria Bush on: 01/06/2021 01:59 PM   Modules accepted: Orders

## 2021-01-06 NOTE — Telephone Encounter (Signed)
Pharmacy requests refill on: Potassium Chloride 20 mEq  LAST REFILL: 12/29/2020 (Q-30, R-0) LAST OV: 12/10/2020 NEXT OV: 01/14/2021 PHARMACY: [place], [place]

## 2021-01-08 NOTE — Telephone Encounter (Addendum)
Pantoprazole PA is showing approved in CoverMyMeds.  Have not received a fax yet.   Notified [facility] of approval.

## 2021-01-14 ENCOUNTER — Encounter: Payer: Self-pay | Admitting: Family Medicine

## 2021-01-14 ENCOUNTER — Other Ambulatory Visit: Payer: Self-pay

## 2021-01-14 ENCOUNTER — Ambulatory Visit (INDEPENDENT_AMBULATORY_CARE_PROVIDER_SITE_OTHER): Payer: BC Managed Care – PPO | Admitting: Family Medicine

## 2021-01-14 ENCOUNTER — Telehealth: Payer: Self-pay | Admitting: Family Medicine

## 2021-01-14 VITALS — BP 140/80 | HR 77 | Temp 97.7°F | Ht 74.0 in | Wt 225.2 lb

## 2021-01-14 DIAGNOSIS — I10 Essential (primary) hypertension: Secondary | ICD-10-CM | POA: Diagnosis not present

## 2021-01-14 DIAGNOSIS — E1069 Type 1 diabetes mellitus with other specified complication: Secondary | ICD-10-CM

## 2021-01-14 DIAGNOSIS — K219 Gastro-esophageal reflux disease without esophagitis: Secondary | ICD-10-CM | POA: Diagnosis not present

## 2021-01-14 DIAGNOSIS — E785 Hyperlipidemia, unspecified: Secondary | ICD-10-CM

## 2021-01-14 DIAGNOSIS — I4891 Unspecified atrial fibrillation: Secondary | ICD-10-CM | POA: Diagnosis not present

## 2021-01-14 DIAGNOSIS — E559 Vitamin D deficiency, unspecified: Secondary | ICD-10-CM

## 2021-01-14 DIAGNOSIS — R946 Abnormal results of thyroid function studies: Secondary | ICD-10-CM | POA: Diagnosis not present

## 2021-01-14 DIAGNOSIS — R600 Localized edema: Secondary | ICD-10-CM

## 2021-01-14 DIAGNOSIS — F1020 Alcohol dependence, uncomplicated: Secondary | ICD-10-CM

## 2021-01-14 DIAGNOSIS — F1010 Alcohol abuse, uncomplicated: Secondary | ICD-10-CM

## 2021-01-14 LAB — COMPREHENSIVE METABOLIC PANEL
ALT: 56 U/L — ABNORMAL HIGH (ref 0–53)
AST: 76 U/L — ABNORMAL HIGH (ref 0–37)
Albumin: 2.8 g/dL — ABNORMAL LOW (ref 3.5–5.2)
Alkaline Phosphatase: 206 U/L — ABNORMAL HIGH (ref 39–117)
BUN: 10 mg/dL (ref 6–23)
CO2: 36 mEq/L — ABNORMAL HIGH (ref 19–32)
Calcium: 8.7 mg/dL (ref 8.4–10.5)
Chloride: 96 mEq/L (ref 96–112)
Creatinine, Ser: 0.85 mg/dL (ref 0.40–1.50)
GFR: 95.92 mL/min (ref >60.00)
Glucose, Bld: 100 mg/dL — ABNORMAL HIGH (ref 70–99)
Potassium: 3.6 mEq/L (ref 3.5–5.1)
Sodium: 139 mEq/L (ref 135–145)
Total Bilirubin: 0.7 mg/dL (ref 0.2–1.2)
Total Protein: 5.7 g/dL — ABNORMAL LOW (ref 6.0–8.3)

## 2021-01-14 LAB — CBC WITH DIFFERENTIAL/PLATELET
Basophils Absolute: 0.1 10*3/uL (ref 0.0–0.1)
Basophils Relative: 0.9 % (ref 0.0–3.0)
Eosinophils Absolute: 0.2 10*3/uL (ref 0.0–0.7)
Eosinophils Relative: 2.3 % (ref 0.0–5.0)
HCT: 36.4 % — ABNORMAL LOW (ref 39.0–52.0)
Hemoglobin: 12.2 g/dL — ABNORMAL LOW (ref 13.0–17.0)
Lymphocytes Relative: 32.4 % (ref 12.0–46.0)
Lymphs Abs: 2.5 10*3/uL (ref 0.7–4.0)
MCHC: 33.6 g/dL (ref 30.0–36.0)
MCV: 103.3 fl — ABNORMAL HIGH (ref 78.0–100.0)
Monocytes Absolute: 0.6 10*3/uL (ref 0.1–1.0)
Monocytes Relative: 7.3 % (ref 3.0–12.0)
Neutro Abs: 4.4 10*3/uL (ref 1.4–7.7)
Neutrophils Relative %: 57.1 % (ref 43.0–77.0)
Platelets: 385 10*3/uL (ref 150.0–400.0)
RBC: 3.53 Mil/uL — ABNORMAL LOW (ref 4.22–5.81)
RDW: 15.8 % — ABNORMAL HIGH (ref 11.5–15.5)
WBC: 7.6 10*3/uL (ref 4.0–10.5)

## 2021-01-14 LAB — T4, FREE: Free T4: 0.91 ng/dL (ref 0.60–1.60)

## 2021-01-14 LAB — T3: T3, Total: 103 ng/dL (ref 76–181)

## 2021-01-14 LAB — PHOSPHORUS: Phosphorus: 4.5 mg/dL (ref 2.3–4.6)

## 2021-01-14 LAB — TSH: TSH: 0.42 u[IU]/mL (ref 0.35–4.50)

## 2021-01-14 LAB — MAGNESIUM: Magnesium: 1.6 mg/dL (ref 1.5–2.5)

## 2021-01-14 MED ORDER — POTASSIUM CHLORIDE CRYS ER 20 MEQ PO TBCR: 20.0000 meq | EXTENDED_RELEASE_TABLET | Freq: Every day | ORAL | 3 refills | Status: DC

## 2021-01-14 MED ORDER — VITAMIN D3 25 MCG (1000 UT) PO CAPS: 1.0000 | ORAL_CAPSULE | Freq: Every day | ORAL | Status: DC

## 2021-01-14 MED ORDER — FUROSEMIDE 40 MG PO TABS: 40.0000 mg | ORAL_TABLET | Freq: Every day | ORAL | 3 refills | Status: DC

## 2021-01-14 NOTE — Assessment & Plan Note (Addendum)
Encouraged full cessation. Reviewed risks of alcohol use and heart disease.  He states he has significantly dropped alcohol use - down to 4 oz whipped vodka/day.

## 2021-01-14 NOTE — Progress Notes (Signed)
Patient ID: Russell Gomez, male    DOB: 09/23/1961, 60 y.o.   MRN: [id number]  This visit was conducted in person.  BP 140/80   Pulse 77   Temp 97.7 F (36.5 C) (Temporal)   Ht 6' 2" (1.88 m)   Wt 225 lb 3 oz (102.1 kg)   SpO2 97%   BMI 28.91 kg/m    CC: hosp f/u visit  Subjective:   HPI: Russell Gomez is a 60 y.o. male presenting on 01/14/2021 for Hospitalization Follow-up (Admitted on 12/26/20 to [facility], dx rapid Afib.)   Recent hospitalization to [facility] for episode of paroxysmal atrial fibrillation initially treated with amiodarone and cardizem drip, switched to oral medications. eliquis started. Planned cardizem CD 252m bid and metoprolol 516mbid and amiodarone. He also had multiple electrolyte abnormalities in setting of alcohol abuse. Rx reglan PRN nausea/vomiting - hasn't needed. Aspirin, potassium, enalapril discontinued on discharge. Eliquis is very expensive.   Echocardiogram 12/2020 - LVEF 60-65%, normal wall motion, RV systolic function normal, LA mildly dilated, mild-mod MVR, normal AV.   Established with Dr GoRockey Situast month, note reviewed. rec start eliquis for afib, monitor for bruising, GIB. Planned f/u in 2 weeks. Did not start diltiazem or amiodarone - these remain on hold at this time.  Endo f/u planned 01/20/2021  Pantoprazole 4051mID PA recently approved by insurance. However it remains $100/bottle. He's been taking omeprazole 40m83mC 2 tablets once daily instead.  Since home feels tired. 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 Denies chest pain, dyspnea, dizziness, or palpitations. No headache.  Home BP readings have been elevated - 150-190/80-90s (wrist meter)  Has dropped alcohol intake down to 1 drink/day (4oz vodka).    Admission date: 12/26/2020 Discharge date: 12/29/2020 TCM hosp f/u phone call not completed  D/c diagnosis: Principal Problem:   Atrial fibrillation with RVR (HCC)Anokative Problems:   Diabetes mellitus type 1, controlled (HCC)[place] Essential hypertension   Nausea vomiting and diarrhea   Hypokalemia   Type 1 diabetes mellitus with hyperlipidemia (HCC)   Hypocalcemia   Hypomagnesemia   Alcohol use disorder, moderate, dependence (HCC)   Acute gastroenteritis   Elevated troponin   Alcohol withdrawal syndrome with complication (HCC)   Hypophosphatemia     Relevant past medical, surgical, family and social history reviewed and updated as indicated. Interim medical history since our last visit reviewed. Allergies and medications reviewed and updated. Outpatient Medications Prior to Visit  Medication Sig Dispense Refill  . apixaban (ELIQUIS) 5 MG TABS tablet Take 1 tablet (5 mg total) by mouth 2 (two) times daily. 60 tablet 0  . folic acid (FOLVITE) 1 MG tablet Take 1 tablet (1 mg total) by mouth daily. 30 tablet 11  . glucose blood test strip Use 6 (six) times daily    . hydrOXYzine (ATARAX/VISTARIL) 25 MG tablet Take 0.5-1 tablets (12.5-25 mg total) by mouth 2 (two) times daily as needed for anxiety. 30 tablet 0  . insulin NPH Human (NOVOLIN N) 100 UNIT/ML injection Inject 9 Units into the skin 2 (two) times daily.    . insulin regular (NOVOLIN R) 100 units/mL injection Inject 5-9 Units into the skin as directed. Sliding scale as needed.    . metoprolol tartrate (LOPRESSOR) 100 MG tablet Take 1 tablet (100 mg total) by mouth 2 (two) times daily. 60 tablet 6  . pravastatin (PRAVACHOL) 80 MG tablet Take 1 tablet by mouth once daily 90 tablet 2  . furosemide (  LASIX) 40 MG tablet Take 1 tablet (40 mg total) by mouth daily. 30 tablet 3  . enalapril (VASOTEC) 20 MG tablet Take 1 tablet (20 mg total) by mouth daily.    . pantoprazole (PROTONIX) 40 MG tablet Take 1 tablet (40 mg total) by mouth 2 (two) times daily before a meal. (Patient not taking: Reported on 01/14/2021) 180 tablet 1  . potassium chloride (KLOR-CON) 20 MEQ packet Take 20 mEq by mouth daily. (Patient not taking: Reported on 01/14/2021) 30 packet 0  . Albuterol  Sulfate (PROAIR RESPICLICK) 128 (90 Base) MCG/ACT AEPB Inhale 2 puffs into the lungs in the morning and at bedtime. 1 each 0  . Blood Glucose Monitoring Suppl (ACCU-CHEK GUIDE) w/Device KIT See admin instructions.    . Coral Calcium (SM CORAL CALCIUM) 1000 (390 Ca) MG TABS Take 1 tablet by mouth daily. 60 tablet   . insulin lispro (HUMALOG) 100 UNIT/ML injection 5-15 units before each meal based on carb intake and sugar     No facility-administered medications prior to visit.     Per HPI unless specifically indicated in ROS section below Review of Systems Objective:  BP 140/80   Pulse 77   Temp 97.7 F (36.5 C) (Temporal)   Ht 6' 2" (1.88 m)   Wt 225 lb 3 oz (102.1 kg)   SpO2 97%   BMI 28.91 kg/m   Wt Readings from Last 3 Encounters:  01/14/21 225 lb 3 oz (102.1 kg)  12/31/20 212 lb (96.2 kg)  12/29/20 220 lb 7.4 oz (100 kg)      Physical Exam Vitals and nursing note reviewed.  Constitutional:      Appearance: Normal appearance. He is not ill-appearing.  Cardiovascular:     Rate and Rhythm: Normal rate. Rhythm irregular.     Pulses: Normal pulses.     Heart sounds: Normal heart sounds. No murmur heard.     Comments: PACs, not irregularly irregular Pulmonary:     Effort: Pulmonary effort is normal. No respiratory distress.     Breath sounds: Normal breath sounds. No wheezing, rhonchi or rales.  Musculoskeletal:        General: Normal range of motion.     Right lower leg: Edema (1+) present.     Left lower leg: Edema (1+) present.  Skin:    Findings: Bruising present. No rash.  Neurological:     Mental Status: He is alert.  Psychiatric:        Mood and Affect: Mood normal.        Behavior: Behavior normal.       Results for orders placed or performed during the hospital encounter of 12/26/20  Resp Panel by RT-PCR (Flu A&B, Covid) Nasopharyngeal Swab   Specimen: Nasopharyngeal Swab; Nasopharyngeal(NP) swabs in vial transport medium  Result Value Ref Range   SARS  Coronavirus 2 by RT PCR NEGATIVE NEGATIVE   Influenza A by PCR NEGATIVE NEGATIVE   Influenza B by PCR NEGATIVE NEGATIVE  CBC with Differential  Result Value Ref Range   WBC 10.9 (H) 4.0 - 10.5 K/uL   RBC 4.35 4.22 - 5.81 MIL/uL   Hemoglobin 15.0 13.0 - 17.0 g/dL   HCT 42.5 39.0 - 52.0 %   MCV 97.7 80.0 - 100.0 fL   MCH 34.5 (H) 26.0 - 34.0 pg   MCHC 35.3 30.0 - 36.0 g/dL   RDW 14.7 11.5 - 15.5 %   Platelets 196 150 - 400 K/uL   nRBC 0.3 (H) 0.0 -  0.2 %   Neutrophils Relative % 77 %   Neutro Abs 8.4 (H) 1.7 - 7.7 K/uL   Lymphocytes Relative 15 %   Lymphs Abs 1.6 0.7 - 4.0 K/uL   Monocytes Relative 7 %   Monocytes Absolute 0.8 0.1 - 1.0 K/uL   Eosinophils Relative 0 %   Eosinophils Absolute 0.0 0.0 - 0.5 K/uL   Basophils Relative 0 %   Basophils Absolute 0.0 0.0 - 0.1 K/uL   Immature Granulocytes 1 %   Abs Immature Granulocytes 0.06 0.00 - 0.07 K/uL  Comprehensive metabolic panel  Result Value Ref Range   Sodium 142 135 - 145 mmol/L   Potassium 3.1 (L) 3.5 - 5.1 mmol/L   Chloride 95 (L) 98 - 111 mmol/L   CO2 29 22 - 32 mmol/L   Glucose, Bld 93 70 - 99 mg/dL   BUN 8 6 - 20 mg/dL   Creatinine, Ser 0.73 0.61 - 1.24 mg/dL   Calcium 6.3 (LL) 8.9 - 10.3 mg/dL   Total Protein 5.9 (L) 6.5 - 8.1 g/dL   Albumin 2.8 (L) 3.5 - 5.0 g/dL   AST 91 (H) 15 - 41 U/L   ALT 35 0 - 44 U/L   Alkaline Phosphatase 204 (H) 38 - 126 U/L   Total Bilirubin 1.2 0.3 - 1.2 mg/dL   GFR, Estimated >60 >60 mL/min   Anion gap 18 (H) 5 - 15  Magnesium  Result Value Ref Range   Magnesium 1.1 (L) 1.7 - 2.4 mg/dL  Hemoglobin A1c  Result Value Ref Range   Hgb A1c MFr Bld 7.4 (H) 4.8 - 5.6 %   Mean Plasma Glucose 165.68 mg/dL  HIV Antibody (routine testing w rflx)  Result Value Ref Range   HIV Screen 4th Generation wRfx Non Reactive Non Reactive  Basic metabolic panel  Result Value Ref Range   Sodium 140 135 - 145 mmol/L   Potassium 2.9 (L) 3.5 - 5.1 mmol/L   Chloride 93 (L) 98 - 111 mmol/L   CO2 30  22 - 32 mmol/L   Glucose, Bld 118 (H) 70 - 99 mg/dL   BUN 7 6 - 20 mg/dL   Creatinine, Ser 0.80 0.61 - 1.24 mg/dL   Calcium 6.8 (L) 8.9 - 10.3 mg/dL   GFR, Estimated >60 >60 mL/min   Anion gap 17 (H) 5 - 15  CBC  Result Value Ref Range   WBC 12.1 (H) 4.0 - 10.5 K/uL   RBC 4.07 (L) 4.22 - 5.81 MIL/uL   Hemoglobin 14.1 13.0 - 17.0 g/dL   HCT 39.5 39.0 - 52.0 %   MCV 97.1 80.0 - 100.0 fL   MCH 34.6 (H) 26.0 - 34.0 pg   MCHC 35.7 30.0 - 36.0 g/dL   RDW 14.9 11.5 - 15.5 %   Platelets 204 150 - 400 K/uL   nRBC 0.2 0.0 - 0.2 %  Phosphorus  Result Value Ref Range   Phosphorus 2.5 2.5 - 4.6 mg/dL  Magnesium  Result Value Ref Range   Magnesium 1.7 1.7 - 2.4 mg/dL  Glucose, capillary  Result Value Ref Range   Glucose-Capillary 133 (H) 70 - 99 mg/dL  Glucose, capillary  Result Value Ref Range   Glucose-Capillary 146 (H) 70 - 99 mg/dL  Glucose, capillary  Result Value Ref Range   Glucose-Capillary 164 (H) 70 - 99 mg/dL  Magnesium  Result Value Ref Range   Magnesium 1.8 1.7 - 2.4 mg/dL  Phosphorus  Result Value Ref Range  Phosphorus 2.1 (L) 2.5 - 4.6 mg/dL  Comprehensive metabolic panel  Result Value Ref Range   Sodium 135 135 - 145 mmol/L   Potassium 2.8 (L) 3.5 - 5.1 mmol/L   Chloride 93 (L) 98 - 111 mmol/L   CO2 31 22 - 32 mmol/L   Glucose, Bld 93 70 - 99 mg/dL   BUN 7 6 - 20 mg/dL   Creatinine, Ser 0.70 0.61 - 1.24 mg/dL   Calcium 6.7 (L) 8.9 - 10.3 mg/dL   Total Protein 5.6 (L) 6.5 - 8.1 g/dL   Albumin 2.4 (L) 3.5 - 5.0 g/dL   AST 51 (H) 15 - 41 U/L   ALT 25 0 - 44 U/L   Alkaline Phosphatase 197 (H) 38 - 126 U/L   Total Bilirubin 1.7 (H) 0.3 - 1.2 mg/dL   GFR, Estimated >60 >60 mL/min   Anion gap 11 5 - 15  CBC  Result Value Ref Range   WBC 11.4 (H) 4.0 - 10.5 K/uL   RBC 3.72 (L) 4.22 - 5.81 MIL/uL   Hemoglobin 13.1 13.0 - 17.0 g/dL   HCT 36.3 (L) 39.0 - 52.0 %   MCV 97.6 80.0 - 100.0 fL   MCH 35.2 (H) 26.0 - 34.0 pg   MCHC 36.1 (H) 30.0 - 36.0 g/dL   RDW  14.8 11.5 - 15.5 %   Platelets 133 (L) 150 - 400 K/uL   nRBC 0.0 0.0 - 0.2 %  Glucose, capillary  Result Value Ref Range   Glucose-Capillary 271 (H) 70 - 99 mg/dL  Glucose, capillary  Result Value Ref Range   Glucose-Capillary 419 (H) 70 - 99 mg/dL  Glucose, capillary  Result Value Ref Range   Glucose-Capillary 273 (H) 70 - 99 mg/dL  Glucose, capillary  Result Value Ref Range   Glucose-Capillary 261 (H) 70 - 99 mg/dL  Glucose, capillary  Result Value Ref Range   Glucose-Capillary 60 (L) 70 - 99 mg/dL  Glucose, capillary  Result Value Ref Range   Glucose-Capillary 189 (H) 70 - 99 mg/dL  Glucose, capillary  Result Value Ref Range   Glucose-Capillary 220 (H) 70 - 99 mg/dL  Glucose, capillary  Result Value Ref Range   Glucose-Capillary 348 (H) 70 - 99 mg/dL  Basic metabolic panel  Result Value Ref Range   Sodium 133 (L) 135 - 145 mmol/L   Potassium 3.6 3.5 - 5.1 mmol/L   Chloride 94 (L) 98 - 111 mmol/L   CO2 32 22 - 32 mmol/L   Glucose, Bld 189 (H) 70 - 99 mg/dL   BUN 11 6 - 20 mg/dL   Creatinine, Ser 0.73 0.61 - 1.24 mg/dL   Calcium 7.0 (L) 8.9 - 10.3 mg/dL   GFR, Estimated >60 >60 mL/min   Anion gap 7 5 - 15  Magnesium  Result Value Ref Range   Magnesium 1.8 1.7 - 2.4 mg/dL  Phosphorus  Result Value Ref Range   Phosphorus 1.5 (L) 2.5 - 4.6 mg/dL  CBC  Result Value Ref Range   WBC 8.5 4.0 - 10.5 K/uL   RBC 3.37 (L) 4.22 - 5.81 MIL/uL   Hemoglobin 11.7 (L) 13.0 - 17.0 g/dL   HCT 33.3 (L) 39.0 - 52.0 %   MCV 98.8 80.0 - 100.0 fL   MCH 34.7 (H) 26.0 - 34.0 pg   MCHC 35.1 30.0 - 36.0 g/dL   RDW 14.6 11.5 - 15.5 %   Platelets 119 (L) 150 - 400 K/uL   nRBC 0.0 0.0 -  0.2 %  Glucose, capillary  Result Value Ref Range   Glucose-Capillary 259 (H) 70 - 99 mg/dL  Glucose, capillary  Result Value Ref Range   Glucose-Capillary 119 (H) 70 - 99 mg/dL  Glucose, capillary  Result Value Ref Range   Glucose-Capillary 150 (H) 70 - 99 mg/dL  POC CBG, ED  Result Value Ref  Range   Glucose-Capillary 283 (H) 70 - 99 mg/dL  ECHOCARDIOGRAM COMPLETE  Result Value Ref Range   Weight 3,360 oz   Height 74 in   BP 133/67 mmHg   Ao pk vel 1.71 m/s   AR max vel 2.18 cm2   AV Peak grad 11.7 mmHg   S' Lateral 4.55 cm   Area-P 1/2 3.83 cm2  Troponin I (High Sensitivity)  Result Value Ref Range   Troponin I (High Sensitivity) 30 (H) <18 ng/L  Troponin I (High Sensitivity)  Result Value Ref Range   Troponin I (High Sensitivity) 31 (H) <18 ng/L   Assessment & Plan:  This visit occurred during the SARS-CoV-2 public health emergency.  Safety protocols were in place, including screening questions prior to the visit, additional usage of staff PPE, and extensive cleaning of exam room while observing appropriate contact time as indicated for disinfecting solutions.   Problem List Items Addressed This Visit    Diabetes mellitus type 1, controlled ([place])    Keep endo f/u next week.       Relevant Medications   insulin NPH Human (NOVOLIN N) 100 UNIT/ML injection   enalapril (VASOTEC) 20 MG tablet   Hyperlipidemia due to type 1 diabetes mellitus ([place])    Continue atorvastatin.       Relevant Medications   insulin NPH Human (NOVOLIN N) 100 UNIT/ML injection   furosemide (LASIX) 40 MG tablet   enalapril (VASOTEC) 20 MG tablet   Essential hypertension    Chronic, elevated readings at home.  Continue metoprolol 153m bid, restart enalapril 239monce daily.  Continue lasix 2089maily. Consider increasing to BID      Relevant Medications   furosemide (LASIX) 40 MG tablet   enalapril (VASOTEC) 20 MG tablet   GERD    Severe GERD previously managed on Rx PPI BID. Then insurance stopped covering this. After PA, higher dose has been approved but pt states still $100/bottle. Has been taking omeprazole 30m55mtab daily while we await insurance coverage.       Abnormal thyroid function test    TSH low. Update TFTs and f/u with endo.  Ensure hyperthyroidism not contributing  to afib.       Pedal edema    Ongoing despite lasix use daily. Discussed importance of good nutrition status - he has h/o malnutrition in alcohol abuse with low protein levels - update today.       Vitamin D deficiency    Has started 1000 IU daily.       Alcohol abuse    Encouraged full cessation. Reviewed risks of alcohol use and heart disease.  He states he has significantly dropped alcohol use - down to 4 oz whipped vodka/day.       Hypomagnesemia   Relevant Orders   Magnesium   Alcohol use disorder, moderate, dependence (HCC)   At[place] RVR ([place]CC[place]    Currently on eliquis and metoprolol 100mg6m. Not on amiodarone or dilt.  Has f/u with cards planned later this month.  Reviewed relation of alcohol use and heart disease (afib, dilated cardiomyopathy, etc).  Relevant Medications   furosemide (LASIX) 40 MG tablet   enalapril (VASOTEC) 20 MG tablet   Other Relevant Orders   Comprehensive metabolic panel   CBC with Differential/Platelet   TSH   T4, free   T3   Hypophosphatemia   Relevant Orders   Phosphorus       Meds ordered this encounter  Medications  . furosemide (LASIX) 40 MG tablet    Sig: Take 1 tablet (40 mg total) by mouth daily.    Dispense:  30 tablet    Refill:  3  . Cholecalciferol (VITAMIN D3) 25 MCG (1000 UT) CAPS    Sig: Take 1 capsule (1,000 Units total) by mouth daily.    Dispense:  30 capsule   Orders Placed This Encounter  Procedures  . Comprehensive metabolic panel  . CBC with Differential/Platelet  . TSH  . T4, free  . T3  . Phosphorus  . Magnesium    Patient Instructions  Labs today  Stay off alcohol  Continue current medicines  Keep appointments with Dr Honor Junes and Rockey Situ Return in 3 months for physical    Follow up plan: Return in about 3 months (around 04/16/2021), or if symptoms worsen or fail to improve, for annual exam, prior fasting for blood work.  Ria Bush, MD

## 2021-01-14 NOTE — Patient Instructions (Addendum)
Labs today  Stay off alcohol  Continue current medicines  Keep appointments with Dr Honor Junes and Rockey Situ Return in 3 months for physical

## 2021-01-14 NOTE — Assessment & Plan Note (Signed)
Ongoing despite lasix use daily. Discussed importance of good nutrition status - he has h/o malnutrition in alcohol abuse with low protein levels - update today.

## 2021-01-14 NOTE — Assessment & Plan Note (Addendum)
Chronic, elevated readings at home.  Continue metoprolol 100mg  bid, restart enalapril 20mg  once daily.  Continue lasix 20mg  daily. Consider increasing to BID

## 2021-01-14 NOTE — Assessment & Plan Note (Signed)
Has started 1000 IU daily.

## 2021-01-14 NOTE — Addendum Note (Signed)
Addended by: Ria Bush on: 01/14/2021 05:23 PM   Modules accepted: Orders

## 2021-01-14 NOTE — Assessment & Plan Note (Signed)
Currently on eliquis and metoprolol 100mg  bid. Not on amiodarone or dilt.  Has f/u with cards planned later this month.  Reviewed relation of alcohol use and heart disease (afib, dilated cardiomyopathy, etc).

## 2021-01-14 NOTE — Assessment & Plan Note (Signed)
Continue atorvastatin

## 2021-01-14 NOTE — Telephone Encounter (Signed)
Patient is calling in about his potassium chloride . He states that the pharmacy is stating that it is 90.00 a month for this medication. He states that he can't afford that. Please advise. EM

## 2021-01-14 NOTE — Telephone Encounter (Signed)
I've sent in tablets in place of packet.  Please have him call pharmacy to price this out.

## 2021-01-14 NOTE — Assessment & Plan Note (Addendum)
TSH low. Update TFTs and f/u with endo.  Ensure hyperthyroidism not contributing to afib.

## 2021-01-14 NOTE — Assessment & Plan Note (Signed)
Keep endo f/u next week.

## 2021-01-14 NOTE — Assessment & Plan Note (Signed)
Severe GERD previously managed on Rx PPI BID. Then insurance stopped covering this. After PA, higher dose has been approved but pt states still $100/bottle. Has been taking omeprazole 20mg  2 tab daily while we await insurance coverage.

## 2021-01-15 NOTE — Telephone Encounter (Signed)
Attempted to contact pt.  No answer.  Vm box not set up.  Need to relay Dr. Synthia Innocent message.

## 2021-01-16 NOTE — Telephone Encounter (Signed)
Spoke with pt relaying Dr. G's message. Pt verbalizes understanding.  

## 2021-01-18 ENCOUNTER — Other Ambulatory Visit: Payer: Self-pay | Admitting: Family Medicine

## 2021-01-18 MED ORDER — MAGNESIUM 400 MG PO TABS: 1.0000 | ORAL_TABLET | Freq: Every day | ORAL | Status: DC

## 2021-01-20 DIAGNOSIS — E1069 Type 1 diabetes mellitus with other specified complication: Secondary | ICD-10-CM | POA: Diagnosis not present

## 2021-01-20 DIAGNOSIS — E1159 Type 2 diabetes mellitus with other circulatory complications: Secondary | ICD-10-CM | POA: Diagnosis not present

## 2021-01-20 DIAGNOSIS — E10649 Type 1 diabetes mellitus with hypoglycemia without coma: Secondary | ICD-10-CM | POA: Diagnosis not present

## 2021-01-20 DIAGNOSIS — Z720 Tobacco use: Secondary | ICD-10-CM | POA: Diagnosis not present

## 2021-02-10 ENCOUNTER — Other Ambulatory Visit: Payer: Self-pay | Admitting: *Deleted

## 2021-02-10 MED ORDER — APIXABAN 5 MG PO TABS: 5.0000 mg | ORAL_TABLET | Freq: Two times a day (BID) | ORAL | 5 refills | Status: DC

## 2021-02-10 NOTE — Telephone Encounter (Signed)
Eliquis 5mg  paper refill request received. Patient is 61 years old, weight-102.1kg, Crea-0.85 on 01/14/2021, Diagnosis-Afib, and last seen by Dr. Rockey Situ on 12/31/2020 (per this note pt was to start as he had not been taking). Dose is appropriate based on dosing criteria. Will send in refill to requested pharmacy.

## 2021-02-13 ENCOUNTER — Telehealth: Payer: Self-pay | Admitting: Cardiovascular Disease

## 2021-02-13 NOTE — Telephone Encounter (Signed)
Patient was told by Dr. Rockey Situ that a rx for the generic of Eliquis could be called in .  Patient pharmacy states there is no generic.    Please call to discuss options

## 2021-02-13 NOTE — Telephone Encounter (Signed)
Was able to retrun pt's phone call regarding Eliquis, pt reports "I done found out what I needed to know, someone else got with me to and told", he understands no generic for Eliquis at this time, reports his Eliquis will be $50 some each month, states will call back if at any time he cannot afford. Otherwise all questions or concerns were address and no additional concerns at this time.

## 2021-02-23 ENCOUNTER — Telehealth: Payer: Self-pay

## 2021-02-23 NOTE — Telephone Encounter (Signed)
[facility] Night - Client TELEPHONE ADVICE RECORD [facility] Patient Name: Russell Gomez [place] Gender: Male DOB: 10-12-1961 Age: 60 Y 22 M 27 D Return Phone Number: [phone] (Primary) Address: City/ State/ Zip: [place] [address] Client [facility] Night - Client Client Site [place] Physician Ria Bush - MD Contact Type Call Who Is Calling Patient / Member / Family / Caregiver Call Type Triage / Clinical Relationship To Patient Self Return Phone Number [phone] (Primary) Chief Complaint BLOOD PRESSURE LOW - Systolic (top number) 90 or less Reason for Call Symptomatic / Request for Health Information Initial Comment Pts blood pressure is at 80/42 and has further questions. Translation No Nurse Assessment Nurse: Laqueta Due, RN, Metallurgist (Eastern Time): 02/21/2021 5:25:55 PM Confirm and document reason for call. If symptomatic, describe symptoms. ---BP is 80/42. denies dizziness/lightheadedness/ weakness. Does the patient have any new or worsening symptoms? ---Yes Will a triage be completed? ---Yes Related visit to physician within the last 2 weeks? ---N/A Does the PT have any chronic conditions? (i.e. diabetes, asthma, this includes High risk factors for pregnancy, etc.) ---Yes List chronic conditions. ---HTN-takes BP medicine and no recent changes. Is this a behavioral health or substance abuse call? ---No Guidelines Guideline Title Affirmed Question Affirmed Notes Nurse Date/Time (Eastern Time) Blood Pressure - Low [3] Systolic BP < 90 AND [5] NOT dizzy, lightheaded or weak [place], RN, [employer] 02/21/2021 5:27:00 PM Disp. Time Eilene Ghazi Time) Disposition Final User 02/21/2021 5:24:49 PM Send to Urgent Leverne Humbles 02/21/2021 5:31:29 PM See HCP within 4 Hours (or PCP triage) Yes Laqueta Due, RN, [place] PLEASE NOTE: All timestamps contained within this  report are represented as [place] Standard Time. CONFIDENTIALTY NOTICE: This fax transmission is intended only for the addressee. It contains information that is legally privileged, confidential or otherwise protected from use or disclosure. If you are not the intended recipient, you are strictly prohibited from reviewing, disclosing, copying using or disseminating any of this information or taking any action in reliance on or regarding this information. If you have received this fax in error, please notify us immediately by telephone so that we can arrange for its return to [place]. Phone: [phone], Toll-Free: [phone], Fax: [phone] Page: 2 of 2 Call Id: [id number] [place] Disagree/Comply Comply Caller Understands Yes PreDisposition Did not know what to do Care Advice Given Per Guideline SEE HCP (OR PCP TRIAGE) WITHIN 4 HOURS: CALL BACK IF: * Lightheadedness, weakness, or dizziness occurs * Systolic BP under 80 * You become worse CARE ADVICE given per Low Blood Pressure (Adult) guideline. Comments User: Letta Moynahan, RN Date/Time Eilene Ghazi Time): 02/21/2021 5:30:43 PM bp ranges anywhere from 90-160 Referrals [place] REFUSED

## 2021-02-23 NOTE — Telephone Encounter (Signed)
Pt called back BP 80/35 on 02/21/21; pts BP now is 131/73 P 105. No CP,SOB,H/A or dizziness or change in vision. Pt has not taken eliquis or metoprolol 02/23/21. Pt states he has not felt good and has no energy. Pt is going to [facility] now for eval. Sending note to DR Darnell Level and Lattie Haw CMA.

## 2021-02-23 NOTE — Telephone Encounter (Signed)
Unable to reach pt by phone; no answer and no v/m set up. Sending note to Dr Valinda Hoar CMA and Fabio Asa.

## 2021-02-24 ENCOUNTER — Encounter: Payer: Self-pay | Admitting: Emergency Medicine

## 2021-02-24 ENCOUNTER — Other Ambulatory Visit: Payer: Self-pay

## 2021-02-24 ENCOUNTER — Emergency Department: Payer: BC Managed Care – PPO

## 2021-02-24 ENCOUNTER — Emergency Department
Admission: EM | Admit: 2021-02-24 | Discharge: 2021-02-25 | Disposition: A | Discharge status: Home / Self Care | Payer: BC Managed Care – PPO | Attending: Emergency Medicine | Admitting: Emergency Medicine

## 2021-02-24 DIAGNOSIS — D329 Benign neoplasm of meninges, unspecified: Secondary | ICD-10-CM | POA: Diagnosis not present

## 2021-02-24 DIAGNOSIS — Z20822 Contact with and (suspected) exposure to covid-19: Secondary | ICD-10-CM | POA: Diagnosis not present

## 2021-02-24 DIAGNOSIS — I4811 Longstanding persistent atrial fibrillation: Secondary | ICD-10-CM | POA: Insufficient documentation

## 2021-02-24 DIAGNOSIS — F1010 Alcohol abuse, uncomplicated: Secondary | ICD-10-CM | POA: Insufficient documentation

## 2021-02-24 DIAGNOSIS — E1065 Type 1 diabetes mellitus with hyperglycemia: Secondary | ICD-10-CM | POA: Insufficient documentation

## 2021-02-24 DIAGNOSIS — Z7901 Long term (current) use of anticoagulants: Secondary | ICD-10-CM | POA: Insufficient documentation

## 2021-02-24 DIAGNOSIS — R9431 Abnormal electrocardiogram [ECG] [EKG]: Secondary | ICD-10-CM | POA: Insufficient documentation

## 2021-02-24 DIAGNOSIS — R220 Localized swelling, mass and lump, head: Secondary | ICD-10-CM | POA: Diagnosis not present

## 2021-02-24 DIAGNOSIS — I4891 Unspecified atrial fibrillation: Secondary | ICD-10-CM | POA: Diagnosis not present

## 2021-02-24 DIAGNOSIS — Z79899 Other long term (current) drug therapy: Secondary | ICD-10-CM | POA: Diagnosis not present

## 2021-02-24 DIAGNOSIS — F17210 Nicotine dependence, cigarettes, uncomplicated: Secondary | ICD-10-CM | POA: Insufficient documentation

## 2021-02-24 DIAGNOSIS — E860 Dehydration: Secondary | ICD-10-CM | POA: Insufficient documentation

## 2021-02-24 DIAGNOSIS — R739 Hyperglycemia, unspecified: Secondary | ICD-10-CM

## 2021-02-24 DIAGNOSIS — R7881 Bacteremia: Secondary | ICD-10-CM | POA: Diagnosis not present

## 2021-02-24 DIAGNOSIS — G939 Disorder of brain, unspecified: Secondary | ICD-10-CM | POA: Diagnosis not present

## 2021-02-24 DIAGNOSIS — R7309 Other abnormal glucose: Secondary | ICD-10-CM | POA: Diagnosis not present

## 2021-02-24 DIAGNOSIS — I10 Essential (primary) hypertension: Secondary | ICD-10-CM | POA: Insufficient documentation

## 2021-02-24 DIAGNOSIS — Z794 Long term (current) use of insulin: Secondary | ICD-10-CM | POA: Diagnosis not present

## 2021-02-24 LAB — CBC WITH DIFFERENTIAL/PLATELET
Abs Immature Granulocytes: 0.07 10*3/uL (ref 0.00–0.07)
Basophils Absolute: 0.1 10*3/uL (ref 0.0–0.1)
Basophils Relative: 1 %
Eosinophils Absolute: 0 10*3/uL (ref 0.0–0.5)
Eosinophils Relative: 0 %
HCT: 38.5 % — ABNORMAL LOW (ref 39.0–52.0)
Hemoglobin: 13.8 g/dL (ref 13.0–17.0)
Immature Granulocytes: 1 %
Lymphocytes Relative: 26 %
Lymphs Abs: 2.1 10*3/uL (ref 0.7–4.0)
MCH: 33.8 pg (ref 26.0–34.0)
MCHC: 35.8 g/dL (ref 30.0–36.0)
MCV: 94.4 fL (ref 80.0–100.0)
Monocytes Absolute: 0.7 10*3/uL (ref 0.1–1.0)
Monocytes Relative: 9 %
Neutro Abs: 5.3 10*3/uL (ref 1.7–7.7)
Neutrophils Relative %: 63 %
Platelets: 315 10*3/uL (ref 150–400)
RBC: 4.08 MIL/uL — ABNORMAL LOW (ref 4.22–5.81)
RDW: 12.2 % (ref 11.5–15.5)
WBC: 8.3 10*3/uL (ref 4.0–10.5)
nRBC: 0 % (ref 0.0–0.2)

## 2021-02-24 LAB — URINALYSIS, COMPLETE (UACMP) WITH MICROSCOPIC
Bacteria, UA: NONE SEEN
Bilirubin Urine: NEGATIVE
Glucose, UA: >=500 mg/dL — AB
Hgb urine dipstick: NEGATIVE
Ketones, ur: NEGATIVE mg/dL
Leukocytes,Ua: NEGATIVE
Nitrite: NEGATIVE
Protein, ur: NEGATIVE mg/dL
Specific Gravity, Urine: 1.014 (ref 1.005–1.030)
Squamous Epithelial / HPF: NONE SEEN (ref 0–5)
pH: 7 (ref 5.0–8.0)

## 2021-02-24 LAB — BLOOD GAS, VENOUS
Acid-Base Excess: 11.6 mmol/L — ABNORMAL HIGH (ref 0.0–2.0)
Bicarbonate: 35.9 mmol/L — ABNORMAL HIGH (ref 20.0–28.0)
O2 Saturation: 90.1 %
Patient temperature: 37
pCO2, Ven: 44 mmHg (ref 44.0–60.0)
pH, Ven: 7.52 — ABNORMAL HIGH (ref 7.250–7.430)
pO2, Ven: 52 mmHg — ABNORMAL HIGH (ref 32.0–45.0)

## 2021-02-24 LAB — TROPONIN I (HIGH SENSITIVITY): Troponin I (High Sensitivity): 21 ng/L — ABNORMAL HIGH (ref <18)

## 2021-02-24 LAB — COMPREHENSIVE METABOLIC PANEL
ALT: 31 U/L (ref 0–44)
AST: 59 U/L — ABNORMAL HIGH (ref 15–41)
Albumin: 2.7 g/dL — ABNORMAL LOW (ref 3.5–5.0)
Alkaline Phosphatase: 222 U/L — ABNORMAL HIGH (ref 38–126)
Anion gap: 15 (ref 5–15)
BUN: 10 mg/dL (ref 6–20)
CO2: 30 mmol/L (ref 22–32)
Calcium: 6.9 mg/dL — ABNORMAL LOW (ref 8.9–10.3)
Chloride: 88 mmol/L — ABNORMAL LOW (ref 98–111)
Creatinine, Ser: 0.86 mg/dL (ref 0.61–1.24)
GFR, Estimated: >60 mL/min (ref >60)
Glucose, Bld: 545 mg/dL (ref 70–99)
Potassium: 3.3 mmol/L — ABNORMAL LOW (ref 3.5–5.1)
Sodium: 133 mmol/L — ABNORMAL LOW (ref 135–145)
Total Bilirubin: 1.9 mg/dL — ABNORMAL HIGH (ref 0.3–1.2)
Total Protein: 6.6 g/dL (ref 6.5–8.1)

## 2021-02-24 LAB — CBG MONITORING, ED: Glucose-Capillary: >600 mg/dL (ref 70–99)

## 2021-02-24 MED ORDER — POTASSIUM CHLORIDE CRYS ER 20 MEQ PO TBCR
40.0000 meq | EXTENDED_RELEASE_TABLET | Freq: Once | ORAL | Status: AC
  Administered 2021-02-25: 40 meq via ORAL
  Filled 2021-02-24: qty 2

## 2021-02-24 MED ORDER — LORAZEPAM 2 MG/ML IJ SOLN: 0.0000 mg | Freq: Two times a day (BID) | INTRAMUSCULAR | Status: DC

## 2021-02-24 MED ORDER — INSULIN ASPART 100 UNIT/ML ~~LOC~~ SOLN
10.0000 [IU] | Freq: Once | SUBCUTANEOUS | Status: AC
  Administered 2021-02-24: 10 [IU] via INTRAVENOUS
  Filled 2021-02-24: qty 1

## 2021-02-24 MED ORDER — LORAZEPAM 2 MG/ML IJ SOLN: 0.0000 mg | Freq: Four times a day (QID) | INTRAMUSCULAR | Status: DC

## 2021-02-24 MED ORDER — LORAZEPAM 2 MG PO TABS: 0.0000 mg | ORAL_TABLET | Freq: Two times a day (BID) | ORAL | Status: DC

## 2021-02-24 MED ORDER — METOPROLOL TARTRATE 50 MG PO TABS
100.0000 mg | ORAL_TABLET | Freq: Once | ORAL | Status: DC
  Filled 2021-02-24: qty 2

## 2021-02-24 MED ORDER — THIAMINE HCL 100 MG/ML IJ SOLN: 100.0000 mg | Freq: Every day | INTRAMUSCULAR | Status: DC

## 2021-02-24 MED ORDER — LACTATED RINGERS IV BOLUS
1000.0000 mL | Freq: Once | INTRAVENOUS | Status: AC
  Administered 2021-02-24: 1000 mL via INTRAVENOUS

## 2021-02-24 MED ORDER — LORAZEPAM 2 MG PO TABS
0.0000 mg | ORAL_TABLET | Freq: Four times a day (QID) | ORAL | Status: DC
  Administered 2021-02-24: 1 mg via ORAL
  Filled 2021-02-24: qty 1

## 2021-02-24 MED ORDER — MAGNESIUM SULFATE 2 GM/50ML IV SOLN
2.0000 g | Freq: Once | INTRAVENOUS | Status: AC
  Administered 2021-02-24: 2 g via INTRAVENOUS
  Filled 2021-02-24: qty 50

## 2021-02-24 MED ORDER — THIAMINE HCL 100 MG PO TABS: 100.0000 mg | ORAL_TABLET | Freq: Every day | ORAL | Status: DC

## 2021-02-24 MED ORDER — POTASSIUM CHLORIDE 10 MEQ/100ML IV SOLN
10.0000 meq | Freq: Once | INTRAVENOUS | Status: AC
  Administered 2021-02-25: 10 meq via INTRAVENOUS
  Filled 2021-02-24: qty 100

## 2021-02-24 NOTE — Telephone Encounter (Signed)
Attempted to contact pt.  No answer.  Vm not set up.  Need to relay Dr. Synthia Innocent message.

## 2021-02-24 NOTE — ED Provider Notes (Signed)
[facility] Emergency Department Provider Note  ____________________________________________   Event Date/Time   First MD Initiated Contact with Patient 02/24/21 2200     (approximate)  I have reviewed the triage vital signs and the nursing notes.   HISTORY  Chief Complaint Hyperglycemia   HPI Russell Gomez is a 60 y.o. male with a past medical history of arthritis, HTN, HDL, type 1 diabetes, GERD, and alcohol abuse drinking approximately a third of fifth of liquor per day who presents for assessment of elevated blood sugars today he says that it been over 500 on several checks.  Patient states he took his long and short acting earlier today but it has not come down below 500.  He endorses some thirst and fatigue but denies any other acute complaints including headache, chest pain, cough, shortness of breath, Donnell pain, burning with urination, vomiting, diarrhea, dysuria, rash or other acute sick symptoms.  He does note he fell 3 days ago and is not sure if he hit his head or not has not had any subsequent pain or subsequent falls.  He states he is taking Eliquis for his A. fib he has been taking this as directed.         Past Medical History:  Diagnosis Date  . Arthritis    on operative finger  . Dental crowns present   . Diabetes type 1, uncontrolled (HCC)    IDDM (Dr. Eddie Dibbles @ [facility])  . Esophageal reflux   . Goiter 04/2012   no current med, has yearly monitoring  . High cholesterol   . HTN (hypertension)    under control with meds, has been on med x 2 yr.    Patient Active Problem List   Diagnosis Date Noted  . Hypophosphatemia   . Hypocalcemia 12/27/2020  . Hypomagnesemia 12/27/2020  . Alcohol use disorder, moderate, dependence ([place]) 12/27/2020  . Acute gastroenteritis 12/27/2020  . Atrial fibrillation with RVR ([place]) 12/27/2020  . Elevated troponin 12/27/2020  . Dizziness 12/10/2020  . Irregular heart rhythm 12/10/2020  . AAA (abdominal  aortic aneurysm) ([place]) 05/27/2020  . Transaminitis 05/22/2020  . Thiamine deficiency 04/28/2020  . Acute diarrhea 04/25/2020  . Alcohol abuse 04/25/2020  . Macrocytic anemia 04/19/2020  . Abnormal EKG 04/19/2020  . Proteinuria 04/19/2020  . Vitamin B12 deficiency 04/19/2020  . Vitamin D deficiency 04/19/2020  . Folate deficiency 04/19/2020  . Hyperparathyroidism ([place]) 04/19/2020  . Psychosocial stressors 04/27/2019  . Pedal edema 04/08/2017  . Abnormal thyroid function test 02/13/2017  . Hypokalemia 08/27/2015  . Encounter for general adult medical examination with abnormal findings 01/23/2014  . Cough 01/09/2014  . Nausea vomiting and diarrhea 01/09/2014  . Colloid thyroid nodule 05/05/2012  . Erectile dysfunction 04/19/2012  . Diabetes mellitus type 1, controlled ([place]) 10/22/2010  . Hyperlipidemia due to type 1 diabetes mellitus ([place]) 10/22/2010  . TOBACCO ABUSE 10/22/2010  . Essential hypertension 10/22/2010  . GERD 10/22/2010    Past Surgical History:  Procedure Laterality Date  . DISTAL INTERPHALANGEAL JOINT FUSION Right 03/05/2014   Procedure: DEBRIDEMENT (DIP) DISTAL INTERPHALANGEAL RIGHT MIDDLE FINGER;  Surgeon: Wynonia Sours, MD;  Location: [place];  Service: Orthopedics;  Laterality: Right;  . KNEE ARTHROSCOPY    . MASS EXCISION Right 03/05/2014   Procedure: EXCISION CYST ;  Surgeon: Wynonia Sours, MD;  Location: [place];  Service: Orthopedics;  Laterality: Right;  ANESTHESIA: IV REGINAL FAB  . OPEN REDUCTION NASAL FRACTURE  12/26/2008  with closure of nasal lac.  Marland Kitchen ORIF DISTAL RADIUS FRACTURE Right 12/26/2008  . ORIF WRIST FRACTURE Left 05/12/2016   takedown of nonunion/malunion and OPEN REDUCTION INTERNAL FIXATION (ORIF) WRIST FRACTURE;  Surgeon: Corky Mull, MD  . PERCUTANEOUS PINNING Left 02/06/2013   Procedure: PINNING PIP OF THE LEFT MIDDLE FINGER ;  Surgeon: Wynonia Sours, MD;  Location: [place];  Service:  Orthopedics;  Laterality: Left;  . SKIN GRAFT SPLIT THICKNESS LEG / FOOT Right 1985   thigh after trauma vs machine at work  . TRIGGER FINGER RELEASE  11/28/2012   Procedure: RELEASE TRIGGER FINGER/A-1 PULLEY;  Surgeon: Wynonia Sours, MD;  Laterality: Left;  EXCISION MASS LEFT RING FINGER, RELEASE A-1 PULLEY LEFT RING FINGER (ganglion cyst)    Prior to Admission medications   Medication Sig Start Date End Date Taking? Authorizing Provider  apixaban (ELIQUIS) 5 MG TABS tablet Take 1 tablet (5 mg total) by mouth 2 (two) times daily. 02/10/21   Minna Merritts, MD  Cholecalciferol (VITAMIN D3) 25 MCG (1000 UT) CAPS Take 1 capsule (1,000 Units total) by mouth daily. 01/14/21   Ria Bush, MD  enalapril (VASOTEC) 20 MG tablet Take 1 tablet (20 mg total) by mouth daily. 01/14/21   Ria Bush, MD  folic acid (FOLVITE) 1 MG tablet Take 1 tablet (1 mg total) by mouth daily. 04/19/20   Ria Bush, MD  furosemide (LASIX) 40 MG tablet Take 1 tablet (40 mg total) by mouth daily. 01/14/21   Ria Bush, MD  glucose blood test strip Use 6 (six) times daily 01/16/18   [provider]  hydrOXYzine (ATARAX/VISTARIL) 25 MG tablet Take 0.5-1 tablets (12.5-25 mg total) by mouth 2 (two) times daily as needed for anxiety. 04/25/20   Ria Bush, MD  insulin NPH Human (NOVOLIN N) 100 UNIT/ML injection Inject 9 Units into the skin 2 (two) times daily.    [provider]  insulin regular (NOVOLIN R) 100 units/mL injection Inject 5-9 Units into the skin as directed. Sliding scale as needed.    [provider]  Magnesium 400 MG TABS Take 1 tablet by mouth daily. 01/18/21   Ria Bush, MD  metoprolol tartrate (LOPRESSOR) 100 MG tablet Take 1 tablet (100 mg total) by mouth 2 (two) times daily. 12/31/20   Minna Merritts, MD  pantoprazole (PROTONIX) 40 MG tablet Take 1 tablet (40 mg total) by mouth 2 (two) times daily before a meal. Patient not taking: Reported on  01/14/2021 12/15/20   Ria Bush, MD  potassium chloride SA (KLOR-CON) 20 MEQ tablet Take 1 tablet (20 mEq total) by mouth daily. 01/14/21   Ria Bush, MD  pravastatin (PRAVACHOL) 80 MG tablet Take 1 tablet by mouth once daily 07/21/20   Ria Bush, MD    Allergies Insulin aspart, Spironolactone, Hydrochlorothiazide, and Morphine  Family History  Problem Relation Age of Onset  . Healthy Father   . Hypertension Mother   . Hyperlipidemia Mother   . Diabetes Maternal Uncle   . Coronary artery disease Maternal Uncle   . Stroke Maternal Aunt   . Diabetes Paternal Grandmother   . Diabetes Paternal Uncle     Social History Social History   Tobacco Use  . Smoking status: Current Every Day Smoker    Packs/day: 1.00    Years: 20.00    Pack years: 20.00    Types: Cigarettes  . Smokeless tobacco: Never Used  . Tobacco comment: smokes 1 pack a day  Vaping Use  . Vaping Use: Former  Substance Use Topics  . Alcohol use: Yes    Alcohol/week: 7.0 standard drinks    Types: 7 Cans of beer per week    Comment: 1-2 drinks per day  . Drug use: No    Review of Systems  Review of Systems  Constitutional: Positive for malaise/fatigue. Negative for chills and fever.  HENT: Negative for sore throat.   Eyes: Negative for pain.  Respiratory: Negative for cough and stridor.   Cardiovascular: Negative for chest pain.  Gastrointestinal: Negative for vomiting.  Genitourinary: Negative for dysuria.  Musculoskeletal: Positive for falls.  Skin: Negative for rash.  Neurological: Negative for seizures, loss of consciousness and headaches.  Endo/Heme/Allergies: Positive for polydipsia.  Psychiatric/Behavioral: Negative for suicidal ideas.  All other systems reviewed and are negative.     ____________________________________________   PHYSICAL EXAM:  VITAL SIGNS: ED Triage Vitals [02/24/21 2148]  Enc Vitals Group     BP (!) 160/106     Pulse Rate 94     Resp 20     Temp  98.3 F (36.8 C)     Temp Source Oral     SpO2 96 %     Weight 180 lb (81.6 kg)     Height 6\' 3"  (1.905 m)     Head Circumference      Peak Flow      Pain Score 0     Pain Loc      Pain Edu?      Excl. in [place]?    Vitals:   02/24/21 2148  BP: (!) 160/106  Pulse: 94  Resp: 20  Temp: 98.3 F (36.8 C)  SpO2: 96%   Physical Exam Vitals and nursing note reviewed.  Constitutional:      Appearance: He is well-developed. He is ill-appearing.  HENT:     Head: Normocephalic and atraumatic.     Right Ear: External ear normal.     Left Ear: External ear normal.     Mouth/Throat:     Mouth: Mucous membranes are dry.  Eyes:     Conjunctiva/sclera: Conjunctivae normal.  Cardiovascular:     Rate and Rhythm: Normal rate. Rhythm irregular.     Heart sounds: No murmur heard.   Pulmonary:     Effort: Pulmonary effort is normal. No respiratory distress.     Breath sounds: Normal breath sounds.  Abdominal:     Palpations: Abdomen is soft.     Tenderness: There is no abdominal tenderness.  Musculoskeletal:     Cervical back: Neck supple.  Skin:    General: Skin is warm and dry.     Capillary Refill: Capillary refill takes more than 3 seconds.  Neurological:     Mental Status: He is alert and oriented to person, place, and time.  Psychiatric:        Mood and Affect: Mood normal.     No tenderness step-offs or deformities over the C/T/L-spine.  2+ bilateral radial and DP pulses.  No significant tenderness large effusion or deformity of the bilateral shoulders elbows wrists knees or ankles.  Strength is symmetric in the bilateral upper and lower extremities.  Sensation is intact to light touch in all extremities.  No other obvious significant trauma on exam other some old appearing scattered bruises over the bilateral forearms without any underlying tenderness. ____________________________________________   LABS (all labs ordered are listed, but only abnormal results are  displayed)  Labs Reviewed  CBC WITH  DIFFERENTIAL/PLATELET - Abnormal; Notable for the following components:      Result Value   RBC 4.08 (*)    HCT 38.5 (*)    All other components within normal limits  COMPREHENSIVE METABOLIC PANEL - Abnormal; Notable for the following components:   Sodium 133 (*)    Potassium 3.3 (*)    Chloride 88 (*)    Glucose, Bld 545 (*)    Calcium 6.9 (*)    Albumin 2.7 (*)    AST 59 (*)    Alkaline Phosphatase 222 (*)    Total Bilirubin 1.9 (*)    All other components within normal limits  URINALYSIS, COMPLETE (UACMP) WITH MICROSCOPIC - Abnormal; Notable for the following components:   Color, Urine STRAW (*)    APPearance CLEAR (*)    Glucose, UA >=500 (*)    All other components within normal limits  BLOOD GAS, VENOUS - Abnormal; Notable for the following components:   pH, Ven 7.52 (*)    pO2, Ven 52.0 (*)    Bicarbonate 35.9 (*)    Acid-Base Excess 11.6 (*)    All other components within normal limits  CBG MONITORING, ED - Abnormal; Notable for the following components:   Glucose-Capillary >600 (*)    All other components within normal limits  TROPONIN I (HIGH SENSITIVITY) - Abnormal; Notable for the following components:   Troponin I (High Sensitivity) 21 (*)    All other components within normal limits  SARS CORONAVIRUS 2 (TAT 6-24 HRS)  BETA-HYDROXYBUTYRIC ACID  ETHANOL  PROTIME-INR   ____________________________________________  EKG  A. fib with a rate of 116, prolonged QTc interval, PVCs and multiple nonspecific ST changes changes. ____________________________________________  RADIOLOGY  ED MD interpretation: Meningioma without evidence of intracranial hemorrhage or other acute intracranial process.  Official radiology report(s): CT Head Wo Contrast  Result Date: 02/24/2021 CLINICAL DATA:  Elevated blood sugar EXAM: CT HEAD WITHOUT CONTRAST TECHNIQUE: Contiguous axial images were obtained from the base of the skull through the  vertex without intravenous contrast. COMPARISON:  MRI 01/21/2009, CT 12/26/2008 FINDINGS: Brain: No acute territorial infarction or hemorrhage is visualized. Peripherally calcified mass centered above the left anterior clinoid process. This measures 2.5 x 1.9 by 1.9 cm, increased compared to prior measurement of 1.5 cm. Ventricles are nonenlarged. Mild atrophy. Vascular: No hyperdense vessels.  Carotid vascular calcification Skull: Normal. Negative for fracture or focal lesion. Sinuses/Orbits: No acute finding. Other: None IMPRESSION: 1. No CT evidence for acute intracranial abnormality. 2. 2.5 cm peripherally calcified mass centered at/above the left anterior clinoid process, increased in size compared to prior measurement of 1.5 cm. Findings on previous imaging suggested meningioma. Electronically Signed   By: Donavan Foil M.D.   On: 02/24/2021 22:45    ____________________________________________   PROCEDURES  Procedure(s) performed (including Critical Care):  .1-3 Lead EKG Interpretation Performed by: Lucrezia Starch, MD Authorized by: Lucrezia Starch, MD     Interpretation: abnormal     ECG rate assessment: normal     Rhythm: atrial fibrillation       ____________________________________________   INITIAL IMPRESSION / ASSESSMENT AND PLAN / ED COURSE        Patient presents with above-stated exam for assessment of elevated blood sugars measured at home that have not improved with his home insulin regimen.  He also notes that he is anticoagulated on Eliquis for his A. fib and had a fall a couple days ago and is an alcoholic.  On arrival he is  hypertensive with a BP of 160/106 with otherwise stable vital signs on room air.  He does appear dehydrated with very decreased cap refill and dry mucous membranes.  There is no obvious evidence of trauma on exam and is nonfocal neuro exam given report of fall couple days ago with patient being on Eliquis and him not being sure if he hit his  head CT head obtained.  This was remarkable for chronic meningioma without evidence of acute injury or intracranial process or hemorrhage..  Given reports of elevated blood sugar at home the patient being dehydrated he was given a liter of fluid and screening labs for DKA sent.  He denies any other recent trauma and there is no findings on history or exam to suggest acute infectious foci.  It is certainly possible his alcohol use is contributing to poorly controlled sugars and expect this concern to the patient.  He was placed on CIWA while undergoing his work-up.  CBC shows no leukocytosis or acute anemia and normal platelets.  Troponin elevated 21 although down from on his last check 1 month ago when it was 31.  We will plan to obtain a 2-hour repeat.  VBG with a pH of 7.52 with a PCO2 of 44 and a bicarb of 35.9 not consistent with DKA.  Urinalysis remarkable for greater than 500 glucose without evidence of blood ketones or infection.  Care patient signed over to oncoming doctor at approximate 2300.  Plan is follow-up CMP and if there is evidence of HHS started on insulin drip and if not likely get sliding scale insulin.  Patient given 1 L of LR pending CMP and concern for dehydration likely driving A. fib with RVR.  We will proceed heart responds to fluids and likely insulin into for immediate rate control with beta-blocker or calcium channel blocker at this time.   ____________________________________________   FINAL CLINICAL IMPRESSION(S) / ED DIAGNOSES  Final diagnoses:  Meningioma ([place])  Hyperglycemia  Dehydration  Longstanding persistent atrial fibrillation (HCC)  Alcohol abuse  Hypertension, unspecified type  Atrial fibrillation with RVR (HCC)  QT prolongation    Medications  LORazepam (ATIVAN) injection 0-4 mg ( Intravenous See Alternative 02/24/21 2241)    Or  LORazepam (ATIVAN) tablet 0-4 mg (1 mg Oral Given 02/24/21 2241)  LORazepam (ATIVAN) injection 0-4 mg (has no  administration in time range)    Or  LORazepam (ATIVAN) tablet 0-4 mg (has no administration in time range)  thiamine tablet 100 mg (has no administration in time range)    Or  thiamine (B-1) injection 100 mg (has no administration in time range)  magnesium sulfate IVPB 2 g 50 mL (has no administration in time range)  metoprolol tartrate (LOPRESSOR) tablet 100 mg (has no administration in time range)  potassium chloride SA (KLOR-CON) CR tablet 40 mEq (has no administration in time range)  lactated ringers bolus 1,000 mL (0 mLs Intravenous Stopped 02/24/21 2323)     ED Discharge Orders    None       Note:  This document was prepared using Dragon voice recognition software and may include unintentional dictation errors.   Lucrezia Starch, MD 02/24/21 [phone]

## 2021-02-24 NOTE — Telephone Encounter (Signed)
I don't see where he was seen at [facility]. plz call for update on symptoms.  Needs to be evaluated urgently for feeling ill with low blood pressures.

## 2021-02-24 NOTE — ED Triage Notes (Signed)
Pt arrived via POV with reports of elevated blood sugar >500 that started today.  Pt takes insulin states he took long acting insulin prior to arrival.  Pt is alert and oriented on arrival. Pt reports he just doesn't feel right. Pt also reports elevated BP.

## 2021-02-25 ENCOUNTER — Telehealth: Payer: Self-pay

## 2021-02-25 DIAGNOSIS — F1010 Alcohol abuse, uncomplicated: Secondary | ICD-10-CM

## 2021-02-25 LAB — BETA-HYDROXYBUTYRIC ACID: Beta-Hydroxybutyric Acid: 0.05 mmol/L (ref 0.05–0.27)

## 2021-02-25 LAB — SARS CORONAVIRUS 2 (TAT 6-24 HRS): SARS Coronavirus 2: NEGATIVE

## 2021-02-25 LAB — CBG MONITORING, ED
Glucose-Capillary: 75 mg/dL (ref 70–99)
Glucose-Capillary: 79 mg/dL (ref 70–99)
Glucose-Capillary: 101 mg/dL — ABNORMAL HIGH (ref 70–99)

## 2021-02-25 LAB — TROPONIN I (HIGH SENSITIVITY): Troponin I (High Sensitivity): 16 ng/L (ref <18)

## 2021-02-25 MED ORDER — OXAZEPAM 10 MG PO CAPS: 10.0000 mg | ORAL_CAPSULE | Freq: Two times a day (BID) | ORAL | 0 refills | Status: DC | PRN

## 2021-02-25 MED ORDER — HYDROXYZINE HCL 25 MG PO TABS: 12.5000 mg | ORAL_TABLET | Freq: Two times a day (BID) | ORAL | 0 refills | Status: DC | PRN

## 2021-02-25 NOTE — Telephone Encounter (Addendum)
Referral placed to residential treatment services of [facility].   Please provide him with # to call as well to expedite eval - [phone]  In interim may take hydroxyzine to help with symptoms, I've also sent in oxazepam to use sparingly for alcohol withdrawal symptoms.

## 2021-02-25 NOTE — Telephone Encounter (Signed)
See notes from ED visit yesterday 02/24/21.

## 2021-02-25 NOTE — Telephone Encounter (Signed)
Noted  

## 2021-02-25 NOTE — Addendum Note (Signed)
Addended by: Ria Bush on: 02/25/2021 07:08 PM   Modules accepted: Orders

## 2021-02-25 NOTE — Telephone Encounter (Signed)
Pt reports he is trying to stop drinking alcohol, and he is having tremors and body is trembling... Pt would like to know if there is any Rx that will help with...   I requested to schedule a follow up for pt and next available per pt preference May 3 at 9:30 just in case you would like to follow up as he has recently been seen in the hospital  Please advise

## 2021-02-25 NOTE — Telephone Encounter (Signed)
Pt would like to know if he could referral for drug and alcohol rehab at [facility] an alcohol and drug detox rehab center

## 2021-02-25 NOTE — ED Provider Notes (Signed)
Accepted care of this patient from Dr. Hulan Saas at 11:30 PM.  Patient presented with hyperglycemia with no signs of DKA.  Patient looks dehydrated.  Received 2 L of fluid, potassium IV and p.o., and IV magnesium.  Also received thiamine.  Patient was given 10 units of insulin by Dr. Tamala Julian.  Repeat blood sugar is within normal limits.  He is tolerating p.o.  We did discuss the importance of following his sliding scale especially while drinking as his sugars will become more elevated due to alcohol.  Patient reports that he is trying to quit.  I did offer to find him inpatient detox facility but he wants to try outpatient at this time because he cannot miss work.  Discussed signs and symptoms of complicated alcohol withdrawal and recommended return to the emergency room if these develop.  Otherwise discussed my standard return precautions and close follow-up with PCP   Rudene Re, MD 02/25/21 [phone]

## 2021-02-26 NOTE — Telephone Encounter (Signed)
Spoke with pt relaying Dr. Synthia Innocent message.  Pt verbalizes understanding.  However, he's driving at the moment and will call back to get the phn # provided by Dr. Darnell Level (see message below).

## 2021-02-26 NOTE — Telephone Encounter (Signed)
Pt returning call.  I provided phn # [phone] for [facility].  Also, pt wants to make Dr. Darnell Level aware of the following recent BP readings:  Yesterday 8:30 PM- 127/72, P 87 9:30 PM-  97/57  Today 12:00 AM- 110/62, P 84- after taking Eliquis 2:00 AM- 109/57, P 95 9:30 AM 121/78, P 134   At 10:00 AM, pt took old rx, metoprolol 25 mg.  Dr. Rockey Situ had increased strength to 100 mg, which pt last took 2 days ago.  He has also stopped Enalapril.  Pt is aware Dr. Darnell Level is out of the office and message will be fwd.  I suggested to also inform Dr. Donivan Scull office of above info.  Pt agrees to call them too.

## 2021-02-26 NOTE — Telephone Encounter (Signed)
Agree with holding bp meds until bp stabilizes and reaching out to cardiology office for further advice re antihypertensive regimen.

## 2021-02-26 NOTE — Telephone Encounter (Signed)
Agree with reaching out to Dr. Donivan Scull office.   Is he having any symptoms of low blood pressure?

## 2021-02-27 ENCOUNTER — Telehealth: Payer: Self-pay | Admitting: Cardiovascular Disease

## 2021-02-27 NOTE — Telephone Encounter (Signed)
Pt returning call.  I relayed Dr. G's message.  Pt verbalizes understanding.  

## 2021-02-27 NOTE — Telephone Encounter (Signed)
Attempted to reach out to pt, no answer, unable to LVM, not set up

## 2021-02-27 NOTE — Telephone Encounter (Signed)
Attempted to contact pt.  No answer.  Vm box is not set up.  Need to relay Dr. Synthia Innocent message.

## 2021-02-27 NOTE — Telephone Encounter (Signed)
Pt c/o BP issue: STAT if pt c/o blurred vision, one-sided weakness or slurred speech  1. What are your last 5 BP readings?  Today 02/27/21 0745 107/62 HR 136 Took Eliquis  1130 111/77 HR 102 1230 105/60 HR 93 Patient had not taken any BP medication   2. Are you having any other symptoms (ex. Dizziness, headache, blurred vision, passed out)? No symptoms  3. What is your BP issue? Patient blood pressure getting too low.  Would like to change medication. Please call to discuss.

## 2021-03-04 NOTE — Telephone Encounter (Signed)
Was able to get in touch with pt, Russell Gomez reports he has recently stop taking the 100 mg dose of his metoprolol BID, stated "It has made me pass out three times". Asked pt about BP and HR readings, he reports "my BP got too low". Last episode of passing out was 4/22, reported BP and HR   02/27/21 0745 107/62 HR 136  Reading are before his medication per pt 1130 111/77 HR 102 1230 105/60 HR 93 1330 107/70 HR 130 2130 109/83 HR 87    Since then pt reports has gone back to his original dose of 50 mg BID of metoprolol and has no syncopal episodes since the reduce in medication. Does not monitor BP and HR everyday, did not have any further vitals to give.   Advised pt that the increase in metoprolol was help to maintain an adequate HR, noted numbers in the 130s, he stated the 100 mg "is too much, it will lower my BP and then I'll pass out". Gave ok to stick with the 50 mg BID metoprolol at this time if it makes him feel more comfortable, please keep check on HR, advised will send to Dr. Rockey Situ for review and recommendations.Pt verbalized understanding, questions were address and no additional concerns at this time. Agreeable to plan, will call back for anything further.  Will await Dr. Rockey Situ advice.

## 2021-03-05 NOTE — Telephone Encounter (Signed)
Okay to stay on metoprolol tartrate 50 twice a day Will be very difficult to use higher doses of metoprolol especially if he drinks alcohol, has high sugars, gets dehydrated as seen on recent trip to the emergency room

## 2021-03-10 ENCOUNTER — Ambulatory Visit: Payer: BC Managed Care – PPO | Admitting: Family Medicine

## 2021-03-24 ENCOUNTER — Telehealth: Payer: Self-pay | Admitting: Family Medicine

## 2021-03-24 NOTE — Telephone Encounter (Addendum)
Attempted to contact pt.  No answer.  Vm not set up.  Need to relay Dr. Synthia Innocent message and r/s ER f/u OV.

## 2021-03-24 NOTE — Telephone Encounter (Signed)
Pt missed ER f/u earlier this month. Can we call to check on pt, ensure BPs better controlled with med changes done by cardiology? Would offer f/u OV. Thanks

## 2021-03-25 NOTE — Telephone Encounter (Signed)
Pt returning call.  States he forgot about appt.  Reports BP readings are better.  Pt r/s ER f/u on 04/03/21 at 7:30 and will bring log of recent BP readings.

## 2021-03-25 NOTE — Telephone Encounter (Signed)
Attempted to contact pt.  No answer.  Vm not set up.  Need to relay Dr. G's message and r/s ER f/u OV. 

## 2021-04-03 ENCOUNTER — Inpatient Hospital Stay: Payer: BC Managed Care – PPO | Admitting: Family Medicine

## 2021-04-11 ENCOUNTER — Other Ambulatory Visit: Payer: Self-pay | Admitting: Family Medicine

## 2021-04-11 DIAGNOSIS — E519 Thiamine deficiency, unspecified: Secondary | ICD-10-CM

## 2021-04-11 DIAGNOSIS — R946 Abnormal results of thyroid function studies: Secondary | ICD-10-CM

## 2021-04-11 DIAGNOSIS — E559 Vitamin D deficiency, unspecified: Secondary | ICD-10-CM

## 2021-04-11 DIAGNOSIS — D539 Nutritional anemia, unspecified: Secondary | ICD-10-CM

## 2021-04-11 DIAGNOSIS — E538 Deficiency of other specified B group vitamins: Secondary | ICD-10-CM

## 2021-04-11 DIAGNOSIS — E1069 Type 1 diabetes mellitus with other specified complication: Secondary | ICD-10-CM

## 2021-04-11 DIAGNOSIS — Z125 Encounter for screening for malignant neoplasm of prostate: Secondary | ICD-10-CM

## 2021-04-11 DIAGNOSIS — I4891 Unspecified atrial fibrillation: Secondary | ICD-10-CM

## 2021-04-11 DIAGNOSIS — E213 Hyperparathyroidism, unspecified: Secondary | ICD-10-CM

## 2021-04-14 ENCOUNTER — Other Ambulatory Visit: Payer: BC Managed Care – PPO

## 2021-04-21 ENCOUNTER — Encounter: Payer: BC Managed Care – PPO | Admitting: Family Medicine

## 2021-04-29 ENCOUNTER — Other Ambulatory Visit: Payer: Self-pay

## 2021-04-29 ENCOUNTER — Encounter: Payer: Self-pay | Admitting: *Deleted

## 2021-04-29 DIAGNOSIS — R0902 Hypoxemia: Secondary | ICD-10-CM | POA: Diagnosis not present

## 2021-04-29 DIAGNOSIS — F17210 Nicotine dependence, cigarettes, uncomplicated: Secondary | ICD-10-CM | POA: Insufficient documentation

## 2021-04-29 DIAGNOSIS — I10 Essential (primary) hypertension: Secondary | ICD-10-CM | POA: Insufficient documentation

## 2021-04-29 DIAGNOSIS — E1069 Type 1 diabetes mellitus with other specified complication: Secondary | ICD-10-CM | POA: Diagnosis not present

## 2021-04-29 DIAGNOSIS — E785 Hyperlipidemia, unspecified: Secondary | ICD-10-CM | POA: Diagnosis not present

## 2021-04-29 DIAGNOSIS — R000 Tachycardia, unspecified: Secondary | ICD-10-CM | POA: Diagnosis not present

## 2021-04-29 DIAGNOSIS — Z7901 Long term (current) use of anticoagulants: Secondary | ICD-10-CM | POA: Diagnosis not present

## 2021-04-29 DIAGNOSIS — E860 Dehydration: Secondary | ICD-10-CM | POA: Diagnosis not present

## 2021-04-29 DIAGNOSIS — R112 Nausea with vomiting, unspecified: Secondary | ICD-10-CM | POA: Diagnosis not present

## 2021-04-29 DIAGNOSIS — Z79899 Other long term (current) drug therapy: Secondary | ICD-10-CM | POA: Insufficient documentation

## 2021-04-29 DIAGNOSIS — N529 Male erectile dysfunction, unspecified: Secondary | ICD-10-CM | POA: Diagnosis not present

## 2021-04-29 DIAGNOSIS — E876 Hypokalemia: Secondary | ICD-10-CM | POA: Insufficient documentation

## 2021-04-29 DIAGNOSIS — Z794 Long term (current) use of insulin: Secondary | ICD-10-CM | POA: Diagnosis not present

## 2021-04-29 LAB — COMPREHENSIVE METABOLIC PANEL
ALT: 34 U/L (ref 0–44)
AST: 93 U/L — ABNORMAL HIGH (ref 15–41)
Albumin: 3 g/dL — ABNORMAL LOW (ref 3.5–5.0)
Alkaline Phosphatase: 206 U/L — ABNORMAL HIGH (ref 38–126)
Anion gap: 16 — ABNORMAL HIGH (ref 5–15)
BUN: 9 mg/dL (ref 6–20)
CO2: 29 mmol/L (ref 22–32)
Calcium: 8 mg/dL — ABNORMAL LOW (ref 8.9–10.3)
Chloride: 91 mmol/L — ABNORMAL LOW (ref 98–111)
Creatinine, Ser: 0.8 mg/dL (ref 0.61–1.24)
GFR, Estimated: >60 mL/min (ref >60)
Glucose, Bld: 330 mg/dL — ABNORMAL HIGH (ref 70–99)
Potassium: 2.6 mmol/L — CL (ref 3.5–5.1)
Sodium: 136 mmol/L (ref 135–145)
Total Bilirubin: 1.3 mg/dL — ABNORMAL HIGH (ref 0.3–1.2)
Total Protein: 6.2 g/dL — ABNORMAL LOW (ref 6.5–8.1)

## 2021-04-29 LAB — CBC
HCT: 41.8 % (ref 39.0–52.0)
Hemoglobin: 14.9 g/dL (ref 13.0–17.0)
MCH: 33.4 pg (ref 26.0–34.0)
MCHC: 35.6 g/dL (ref 30.0–36.0)
MCV: 93.7 fL (ref 80.0–100.0)
Platelets: 254 10*3/uL (ref 150–400)
RBC: 4.46 MIL/uL (ref 4.22–5.81)
RDW: 13.2 % (ref 11.5–15.5)
WBC: 5.7 10*3/uL (ref 4.0–10.5)
nRBC: 0 % (ref 0.0–0.2)

## 2021-04-29 LAB — CBG MONITORING, ED: Glucose-Capillary: 336 mg/dL — ABNORMAL HIGH (ref 70–99)

## 2021-04-29 LAB — LIPASE, BLOOD: Lipase: 21 U/L (ref 11–51)

## 2021-04-29 MED ORDER — ONDANSETRON 4 MG PO TBDP
4.0000 mg | ORAL_TABLET | Freq: Once | ORAL | Status: AC | PRN
  Administered 2021-04-29: 4 mg via ORAL
  Filled 2021-04-29: qty 1

## 2021-04-29 NOTE — ED Notes (Signed)
Patient aware that we need urine sample for testing, unable at this time. Pt given instruction on providing urine sample when able to do so.   

## 2021-04-29 NOTE — ED Triage Notes (Signed)
Pt reports onset of vomiting around 2100 tonight. Feels lightheaded. Hx of diabetes, reports glucose in the 200's tonight. No diarrhea or fevers. Denies pain. Clear yellow emesis in triage.

## 2021-04-30 ENCOUNTER — Emergency Department
Admission: EM | Admit: 2021-04-30 | Discharge: 2021-04-30 | Disposition: A | Discharge status: Home / Self Care | Payer: BC Managed Care – PPO | Attending: Emergency Medicine | Admitting: Emergency Medicine

## 2021-04-30 DIAGNOSIS — R112 Nausea with vomiting, unspecified: Secondary | ICD-10-CM

## 2021-04-30 DIAGNOSIS — E876 Hypokalemia: Secondary | ICD-10-CM

## 2021-04-30 DIAGNOSIS — R000 Tachycardia, unspecified: Secondary | ICD-10-CM | POA: Diagnosis not present

## 2021-04-30 LAB — URINALYSIS, COMPLETE (UACMP) WITH MICROSCOPIC
Bacteria, UA: NONE SEEN
Bilirubin Urine: NEGATIVE
Glucose, UA: >=500 mg/dL — AB
Hgb urine dipstick: NEGATIVE
Ketones, ur: NEGATIVE mg/dL
Leukocytes,Ua: NEGATIVE
Nitrite: NEGATIVE
Protein, ur: NEGATIVE mg/dL
Specific Gravity, Urine: 1.018 (ref 1.005–1.030)
Squamous Epithelial / HPF: NONE SEEN (ref 0–5)
pH: 6 (ref 5.0–8.0)

## 2021-04-30 LAB — MAGNESIUM: Magnesium: 1.4 mg/dL — ABNORMAL LOW (ref 1.7–2.4)

## 2021-04-30 MED ORDER — POTASSIUM CHLORIDE CRYS ER 20 MEQ PO TBCR: 20.0000 meq | EXTENDED_RELEASE_TABLET | Freq: Every day | ORAL | 0 refills | Status: DC

## 2021-04-30 MED ORDER — THIAMINE HCL 100 MG/ML IJ SOLN
100.0000 mg | Freq: Once | INTRAMUSCULAR | Status: AC
  Administered 2021-04-30: 100 mg via INTRAVENOUS
  Filled 2021-04-30: qty 2

## 2021-04-30 MED ORDER — MAGNESIUM OXIDE 400 MG PO CAPS: 1.0000 | ORAL_CAPSULE | Freq: Two times a day (BID) | ORAL | 0 refills | Status: AC

## 2021-04-30 MED ORDER — INSULIN ASPART 100 UNIT/ML IJ SOLN
5.0000 [IU] | Freq: Once | INTRAMUSCULAR | Status: AC
  Administered 2021-04-30: 5 [IU] via SUBCUTANEOUS
  Filled 2021-04-30: qty 1

## 2021-04-30 MED ORDER — LACTATED RINGERS IV BOLUS
1000.0000 mL | Freq: Once | INTRAVENOUS | Status: AC
  Administered 2021-04-30: 1000 mL via INTRAVENOUS

## 2021-04-30 MED ORDER — POTASSIUM CHLORIDE CRYS ER 20 MEQ PO TBCR
40.0000 meq | EXTENDED_RELEASE_TABLET | Freq: Once | ORAL | Status: AC
  Administered 2021-04-30: 40 meq via ORAL
  Filled 2021-04-30: qty 2

## 2021-04-30 MED ORDER — MAGNESIUM SULFATE 2 GM/50ML IV SOLN
2.0000 g | Freq: Once | INTRAVENOUS | Status: AC
  Administered 2021-04-30: 2 g via INTRAVENOUS
  Filled 2021-04-30: qty 50

## 2021-04-30 NOTE — ED Provider Notes (Signed)
[facility] Emergency Department Provider Note  ____________________________________________   Event Date/Time   First MD Initiated Contact with Patient 04/30/21 0235     (approximate)  I have reviewed the triage vital signs and the nursing notes.   HISTORY  Chief Complaint Emesis    HPI Russell Gomez is a 60 y.o. male who presents for evaluation of vomiting and some lightheadedness.  He said that he did drink alcohol but a lot less than he used to.  He said he has been compliant with his diabetes medicine but his sugars been running high.  He vomited earlier in the emergency department but he feels better after the Zofran.  He denies fever, sore throat, chest pain, shortness of breath, abdominal pain, and diarrhea.  Nothing particular makes his symptoms better or worse and they were acute and severe earlier and are now mild.     Past Medical History:  Diagnosis Date   Arthritis    on operative finger   Dental crowns present    Diabetes type 1, uncontrolled ([place])    IDDM (Dr. Eddie Dibbles @ [facility])   Esophageal reflux    Goiter 04/2012   no current med, has yearly monitoring   High cholesterol    HTN (hypertension)    under control with meds, has been on med x 2 yr.    Patient Active Problem List   Diagnosis Date Noted   Hypophosphatemia    Hypocalcemia 12/27/2020   Hypomagnesemia 12/27/2020   Alcohol use disorder, moderate, dependence ([place]) 12/27/2020   Acute gastroenteritis 12/27/2020   Atrial fibrillation with RVR ([place]) 12/27/2020   Elevated troponin 12/27/2020   Dizziness 12/10/2020   Irregular heart rhythm 12/10/2020   AAA (abdominal aortic aneurysm) ([place]) 05/27/2020   Transaminitis 05/22/2020   Thiamine deficiency 04/28/2020   Acute diarrhea 04/25/2020   Alcohol abuse 04/25/2020   Macrocytic anemia 04/19/2020   Abnormal EKG 04/19/2020   Proteinuria 04/19/2020   Vitamin B12 deficiency 04/19/2020   Vitamin D deficiency 04/19/2020   Folate  deficiency 04/19/2020   Hyperparathyroidism ([place]) 04/19/2020   Psychosocial stressors 04/27/2019   Pedal edema 04/08/2017   Abnormal thyroid function test 02/13/2017   Hypokalemia 08/27/2015   Encounter for general adult medical examination with abnormal findings 01/23/2014   Cough 01/09/2014   Nausea vomiting and diarrhea 01/09/2014   Colloid thyroid nodule 05/05/2012   Erectile dysfunction 04/19/2012   Diabetes mellitus type 1, controlled ([place]) 10/22/2010   Hyperlipidemia due to type 1 diabetes mellitus ([place]) 10/22/2010   TOBACCO ABUSE 10/22/2010   Essential hypertension 10/22/2010   GERD 10/22/2010    Past Surgical History:  Procedure Laterality Date   DISTAL INTERPHALANGEAL JOINT FUSION Right 03/05/2014   Procedure: DEBRIDEMENT (DIP) DISTAL INTERPHALANGEAL RIGHT MIDDLE FINGER;  Surgeon: Wynonia Sours, MD;  Location: [place];  Service: Orthopedics;  Laterality: Right;   KNEE ARTHROSCOPY     MASS EXCISION Right 03/05/2014   Procedure: EXCISION CYST ;  Surgeon: Wynonia Sours, MD;  Location: [place];  Service: Orthopedics;  Laterality: Right;  ANESTHESIA: IV REGINAL FAB   OPEN REDUCTION NASAL FRACTURE  12/26/2008   with closure of nasal lac.   ORIF DISTAL RADIUS FRACTURE Right 12/26/2008   ORIF WRIST FRACTURE Left 05/12/2016   takedown of nonunion/malunion and OPEN REDUCTION INTERNAL FIXATION (ORIF) WRIST FRACTURE;  Surgeon: Corky Mull, MD   PERCUTANEOUS PINNING Left 02/06/2013   Procedure: PINNING PIP OF THE LEFT MIDDLE FINGER ;  Surgeon: Wynonia Sours, MD;  Location: [place];  Service: Orthopedics;  Laterality: Left;   SKIN GRAFT SPLIT THICKNESS LEG / FOOT Right 1985   thigh after trauma vs machine at work   TRIGGER FINGER RELEASE  11/28/2012   Procedure: RELEASE TRIGGER FINGER/A-1 PULLEY;  Surgeon: Wynonia Sours, MD;  Laterality: Left;  EXCISION MASS LEFT RING FINGER, RELEASE A-1 PULLEY LEFT RING FINGER (ganglion cyst)    Prior to  Admission medications   Medication Sig Start Date End Date Taking? Authorizing Provider  Magnesium Oxide 400 MG CAPS Take 1 capsule (400 mg total) by mouth 2 (two) times daily for 7 days. 04/30/21 05/07/21 Yes Hinda Kehr, MD  potassium chloride SA (KLOR-CON M20) 20 MEQ tablet Take 1 tablet (20 mEq total) by mouth daily. 04/30/21  Yes Hinda Kehr, MD  apixaban (ELIQUIS) 5 MG TABS tablet Take 1 tablet (5 mg total) by mouth 2 (two) times daily. 02/10/21   Minna Merritts, MD  Cholecalciferol (VITAMIN D3) 25 MCG (1000 UT) CAPS Take 1 capsule (1,000 Units total) by mouth daily. 01/14/21   Ria Bush, MD  enalapril (VASOTEC) 20 MG tablet Take 1 tablet (20 mg total) by mouth daily. 01/14/21   Ria Bush, MD  folic acid (FOLVITE) 1 MG tablet Take 1 tablet (1 mg total) by mouth daily. 04/19/20   Ria Bush, MD  furosemide (LASIX) 40 MG tablet Take 1 tablet (40 mg total) by mouth daily. 01/14/21   Ria Bush, MD  glucose blood test strip Use 6 (six) times daily 01/16/18   [provider]  hydrOXYzine (ATARAX/VISTARIL) 25 MG tablet Take 0.5-1 tablets (12.5-25 mg total) by mouth 2 (two) times daily as needed for anxiety or nausea (sedation precautions). 02/25/21   Ria Bush, MD  insulin NPH Human (NOVOLIN N) 100 UNIT/ML injection Inject 9 Units into the skin 2 (two) times daily.    [provider]  insulin regular (NOVOLIN R) 100 units/mL injection Inject 5-9 Units into the skin as directed. Sliding scale as needed.    [provider]  Magnesium 400 MG TABS Take 1 tablet by mouth daily. 01/18/21   Ria Bush, MD  metoprolol tartrate (LOPRESSOR) 100 MG tablet Take 1 tablet (100 mg total) by mouth 2 (two) times daily. 12/31/20   Minna Merritts, MD  oxazepam (SERAX) 10 MG capsule Take 1 capsule (10 mg total) by mouth 2 (two) times daily as needed (alcohol withdrawal symptoms). 02/25/21   Ria Bush, MD  pantoprazole (PROTONIX) 40 MG tablet Take 1  tablet (40 mg total) by mouth 2 (two) times daily before a meal. Patient not taking: Reported on 01/14/2021 12/15/20   Ria Bush, MD  pravastatin (PRAVACHOL) 80 MG tablet Take 1 tablet by mouth once daily 07/21/20   Ria Bush, MD    Allergies Insulin aspart, Spironolactone, Hydrochlorothiazide, and Morphine  Family History  Problem Relation Age of Onset   Healthy Father    Hypertension Mother    Hyperlipidemia Mother    Diabetes Maternal Uncle    Coronary artery disease Maternal Uncle    Stroke Maternal Aunt    Diabetes Paternal Grandmother    Diabetes Paternal Uncle     Social History Social History   Tobacco Use   Smoking status: Every Day    Packs/day: 1.00    Years: 20.00    Pack years: 20.00    Types: Cigarettes   Smokeless tobacco: Never   Tobacco comments:    smokes 1  pack a day  Vaping Use   Vaping Use: Former  Substance Use Topics   Alcohol use: Yes    Alcohol/week: 7.0 standard drinks    Types: 7 Cans of beer per week    Comment: 1-2 drinks per day   Drug use: No    Review of Systems Constitutional: No fever/chills Eyes: No visual changes. ENT: No sore throat. Cardiovascular: Denies chest pain. Respiratory: Denies shortness of breath. Gastrointestinal: Positive for nausea and vomiting, negative for abdominal pain. Genitourinary: Negative for dysuria. Musculoskeletal: Negative for neck pain.  Negative for back pain. Integumentary: Negative for rash. Neurological: Negative for headaches, focal weakness or numbness.   ____________________________________________   PHYSICAL EXAM:  VITAL SIGNS: ED Triage Vitals  Enc Vitals Group     BP 04/29/21 2241 (!) 159/78     Pulse Rate 04/29/21 2241 (!) 111     Resp 04/29/21 2241 16     Temp 04/29/21 2241 98 F (36.7 C)     Temp Source 04/29/21 2241 Oral     SpO2 04/29/21 2241 93 %     Weight --      Height 04/29/21 2239 1.905 m (6\' 3" )     Head Circumference --      Peak Flow --       Pain Score 04/29/21 2239 0     Pain Loc --      Pain Edu? --      Excl. in [place]? --     Constitutional: Alert and oriented.  Eyes: Conjunctivae are normal.  Head: Atraumatic. Nose: No congestion/rhinnorhea. Mouth/Throat: Patient is wearing a mask. Neck: No stridor.  No meningeal signs.   Cardiovascular: Normal rate, regular rhythm. Good peripheral circulation. Respiratory: Normal respiratory effort.  No retractions. Gastrointestinal: Soft and nontender. No distention.  Musculoskeletal: No lower extremity tenderness nor edema. No gross deformities of extremities. Neurologic:  Normal speech and language. No gross focal neurologic deficits are appreciated.  Skin:  Skin is warm, dry and intact. Psychiatric: Mood and affect are normal. Speech and behavior are normal.  ____________________________________________   LABS (all labs ordered are listed, but only abnormal results are displayed)  Labs Reviewed  COMPREHENSIVE METABOLIC PANEL - Abnormal; Notable for the following components:      Result Value   Potassium 2.6 (*)    Chloride 91 (*)    Glucose, Bld 330 (*)    Calcium 8.0 (*)    Total Protein 6.2 (*)    Albumin 3.0 (*)    AST 93 (*)    Alkaline Phosphatase 206 (*)    Total Bilirubin 1.3 (*)    Anion gap 16 (*)    All other components within normal limits  MAGNESIUM - Abnormal; Notable for the following components:   Magnesium 1.4 (*)    All other components within normal limits  CBG MONITORING, ED - Abnormal; Notable for the following components:   Glucose-Capillary 336 (*)    All other components within normal limits  LIPASE, BLOOD  CBC  URINALYSIS, COMPLETE (UACMP) WITH MICROSCOPIC   ____________________________________________  EKG  ED ECG REPORT I, Hinda Kehr, the attending physician, personally viewed and interpreted this ECG.  Date: 04/29/2021 EKG Time: 22: 41 Rate: 110 Rhythm: Sinus tachycardia QRS Axis: normal Intervals: Left anterior fascicular  block with prolonged QTC of 533 ms ST/T Wave abnormalities: Non-specific ST segment / T-wave changes, but no clear evidence of acute ischemia. Narrative Interpretation: no definitive evidence of acute ischemia; does not meet STEMI criteria.  ____________________________________________  RADIOLOGY Ursula Alert, personally viewed and evaluated these images (plain radiographs) as part of my medical decision making, as well as reviewing the written report by the radiologist.  ED MD interpretation: No indication for emergent imaging  Official radiology report(s): No results found.  ____________________________________________   PROCEDURES   Procedure(s) performed (including Critical Care):  Procedures   ____________________________________________   INITIAL IMPRESSION / MDM / [place] / ED COURSE  As part of my medical decision making, I reviewed the following data within the [place] notes reviewed and incorporated, Labs reviewed , EKG interpreted , Old chart reviewed, and Notes from prior ED visits   Differential diagnosis includes, but is not limited to, alcohol intoxication or withdrawal, alcoholic ketoacidosis, electrolyte or metabolic abnormality, alcohol abuse leading to nausea and vomiting, nonspecific gastritis, viral or bacterial illness.  Patient is actually fairly well-appearing and is feeling better now than he did earlier with no treatment other than Zofran given in triage.  His vital signs are stable; originally he was slightly tachycardic but that has improved.  He said his stomach feels a little bit queasy still remains no longer vomiting and is able to tolerate oral fluids.  He claims that he has decreased the amount of alcohol he drinks but is still consuming 2-3 drinks a day.    I suspect this contributes to his baseline lab abnormalities including hepatic function tests which are slightly elevated but stable.  His  potassium is 2.6, likely due to chronic issues as well as his acute vomiting, and I repleted with 40 mill equivalents by mouth as well as 2 g of IV magnesium.  I am also providing a prescription for supplements as listed below.  CBC is normal, lipase is normal.  The patient's metabolic panel also indicated an anion gap of 16 but this is just slightly elevated above the upper limit of normal and should be corrected with regular insulin 5 units subcutaneous and 1 L lactated Ringer's.  Given his alcohol abuse I also provided thiamine 100 mg IV.  The patient says he feels better and feels good to go.  I think this is appropriate and I gave my usual customary follow-up recommendations and return precautions.  He was given the Zofran in triage prior to me seeing the EKG or evaluated the patient and I will not prescribe any Zofran as an outpatient given his prolonged QTC.        ____________________________________________  FINAL CLINICAL IMPRESSION(S) / ED DIAGNOSES  Final diagnoses:  Non-intractable vomiting with nausea, unspecified vomiting type  Hypokalemia  Hypomagnesemia     MEDICATIONS GIVEN DURING THIS VISIT:  Medications  ondansetron (ZOFRAN-ODT) disintegrating tablet 4 mg (4 mg Oral Given 04/29/21 2245)  potassium chloride SA (KLOR-CON) CR tablet 40 mEq (40 mEq Oral Given 04/30/21 0247)  lactated ringers bolus 1,000 mL (0 mLs Intravenous Stopped 04/30/21 0421)  insulin aspart (novoLOG) injection 5 Units (5 Units Subcutaneous Given 04/30/21 0248)  magnesium sulfate IVPB 2 g 50 mL (0 g Intravenous Stopped 04/30/21 0421)  thiamine (B-1) injection 100 mg (100 mg Intravenous Given 04/30/21 0346)     ED Discharge Orders          Ordered    potassium chloride SA (KLOR-CON M20) 20 MEQ tablet  Daily        04/30/21 0448    Magnesium Oxide 400 MG CAPS  2 times daily        04/30/21 0448  Note:  This document was prepared using Dragon voice recognition software and may  include unintentional dictation errors.   Hinda Kehr, MD 04/30/21 [phone]

## 2021-04-30 NOTE — Discharge Instructions (Addendum)
As we discussed, please try to stay hydrated, use your regular medication (insulin) as written, and take the prescribed supplements you were given tonight.  Follow-up with your primary care doctor at the next available opportunity.  Return to the emergency department if you develop new or worsening symptoms that concern you.

## 2021-04-30 NOTE — ED Notes (Signed)
Discharge instructions and prescriptions reviewed with pt. Pt verbalized understanding. Pt ambulated to waiting are without difficulty.   Ruben Reason, RN

## 2021-05-01 ENCOUNTER — Telehealth: Payer: Self-pay | Admitting: Family Medicine

## 2021-05-01 NOTE — Telephone Encounter (Signed)
Noted  

## 2021-05-01 NOTE — Telephone Encounter (Signed)
Call patient no answer voice mail is not set up

## 2021-05-01 NOTE — Telephone Encounter (Signed)
E-scribed refills.  Needs to r/s ER f/u ASAP.  Has no-showed 3x.

## 2021-05-16 DIAGNOSIS — M7989 Other specified soft tissue disorders: Secondary | ICD-10-CM | POA: Diagnosis not present

## 2021-05-16 DIAGNOSIS — S8001XA Contusion of right knee, initial encounter: Secondary | ICD-10-CM | POA: Diagnosis not present

## 2021-05-16 DIAGNOSIS — S8991XA Unspecified injury of right lower leg, initial encounter: Secondary | ICD-10-CM | POA: Diagnosis not present

## 2021-05-26 ENCOUNTER — Telehealth: Payer: Self-pay

## 2021-05-26 NOTE — Telephone Encounter (Signed)
Pt c/o depression for 2 years and reports it is caused from being separated for 2.5 years and living alone and working 3rd shift. Pt states, "I think I am highly depressed." Pt requesting referral to psychiatrist. Pt denies any thought or plans to hurt himself, no suicidal ideations. Advised pt he will need an apt with PCP to discuss depression and next steps. Pt agreed and apt made for 7/26.  Pt also c/o vomiting and diarrhea x 3 days and not eating for 2 days. Pt reports both have progressively gotten better and reports vomiting only 1 x today and thinks it may actually just be acid reflux. Reports diarrhea 2 x today but worse last night. Pt reports he is currently hungry and is getting ready to eat macaroni and cheese, collards greens and cabbage. Advised pt these foods may upset his stomach again and he may want to just start with toast if he has not eaten for 2 days and see how he does and then progress from there. Pt has been staying hydrated but is concerned because he is diabetic. Last BG was this morning and was 120. He reported if he is not feeling totally better by this evening he is going to go to the ER. Pt denies any other symptoms. Advised to continue to increase fluid intake and if symptoms worsened or he developed any new symptoms to contact office. Advised of ER precautions. Pt verbalized understanding.

## 2021-05-27 ENCOUNTER — Other Ambulatory Visit: Payer: Self-pay

## 2021-05-27 ENCOUNTER — Inpatient Hospital Stay
Admission: EM | Admit: 2021-05-27 | Discharge: 2021-05-29 | DRG: 638 | Disposition: A | Discharge status: Home / Self Care | Payer: BC Managed Care – PPO | Attending: Internal Medicine | Admitting: Internal Medicine

## 2021-05-27 ENCOUNTER — Encounter: Payer: Self-pay | Admitting: Emergency Medicine

## 2021-05-27 DIAGNOSIS — Z8249 Family history of ischemic heart disease and other diseases of the circulatory system: Secondary | ICD-10-CM

## 2021-05-27 DIAGNOSIS — Z981 Arthrodesis status: Secondary | ICD-10-CM

## 2021-05-27 DIAGNOSIS — E1165 Type 2 diabetes mellitus with hyperglycemia: Secondary | ICD-10-CM

## 2021-05-27 DIAGNOSIS — Z888 Allergy status to other drugs, medicaments and biological substances status: Secondary | ICD-10-CM | POA: Diagnosis not present

## 2021-05-27 DIAGNOSIS — Z20822 Contact with and (suspected) exposure to covid-19: Secondary | ICD-10-CM | POA: Diagnosis present

## 2021-05-27 DIAGNOSIS — F1020 Alcohol dependence, uncomplicated: Secondary | ICD-10-CM | POA: Diagnosis present

## 2021-05-27 DIAGNOSIS — E440 Moderate protein-calorie malnutrition: Secondary | ICD-10-CM | POA: Insufficient documentation

## 2021-05-27 DIAGNOSIS — E872 Acidosis, unspecified: Secondary | ICD-10-CM

## 2021-05-27 DIAGNOSIS — E785 Hyperlipidemia, unspecified: Secondary | ICD-10-CM

## 2021-05-27 DIAGNOSIS — I10 Essential (primary) hypertension: Secondary | ICD-10-CM | POA: Diagnosis not present

## 2021-05-27 DIAGNOSIS — E7800 Pure hypercholesterolemia, unspecified: Secondary | ICD-10-CM | POA: Diagnosis not present

## 2021-05-27 DIAGNOSIS — R112 Nausea with vomiting, unspecified: Secondary | ICD-10-CM

## 2021-05-27 DIAGNOSIS — E1065 Type 1 diabetes mellitus with hyperglycemia: Secondary | ICD-10-CM

## 2021-05-27 DIAGNOSIS — E873 Alkalosis: Secondary | ICD-10-CM | POA: Diagnosis not present

## 2021-05-27 DIAGNOSIS — K219 Gastro-esophageal reflux disease without esophagitis: Secondary | ICD-10-CM | POA: Diagnosis not present

## 2021-05-27 DIAGNOSIS — Z823 Family history of stroke: Secondary | ICD-10-CM

## 2021-05-27 DIAGNOSIS — I480 Paroxysmal atrial fibrillation: Secondary | ICD-10-CM | POA: Diagnosis not present

## 2021-05-27 DIAGNOSIS — Z83438 Family history of other disorder of lipoprotein metabolism and other lipidemia: Secondary | ICD-10-CM

## 2021-05-27 DIAGNOSIS — Z794 Long term (current) use of insulin: Secondary | ICD-10-CM | POA: Diagnosis not present

## 2021-05-27 DIAGNOSIS — F1010 Alcohol abuse, uncomplicated: Secondary | ICD-10-CM | POA: Diagnosis not present

## 2021-05-27 DIAGNOSIS — F17210 Nicotine dependence, cigarettes, uncomplicated: Secondary | ICD-10-CM | POA: Diagnosis present

## 2021-05-27 DIAGNOSIS — E10649 Type 1 diabetes mellitus with hypoglycemia without coma: Secondary | ICD-10-CM | POA: Diagnosis not present

## 2021-05-27 DIAGNOSIS — R0902 Hypoxemia: Secondary | ICD-10-CM | POA: Diagnosis not present

## 2021-05-27 DIAGNOSIS — Z833 Family history of diabetes mellitus: Secondary | ICD-10-CM

## 2021-05-27 DIAGNOSIS — Z6822 Body mass index (BMI) 22.0-22.9, adult: Secondary | ICD-10-CM

## 2021-05-27 DIAGNOSIS — Z7901 Long term (current) use of anticoagulants: Secondary | ICD-10-CM | POA: Diagnosis not present

## 2021-05-27 DIAGNOSIS — I4891 Unspecified atrial fibrillation: Secondary | ICD-10-CM | POA: Diagnosis not present

## 2021-05-27 DIAGNOSIS — E1010 Type 1 diabetes mellitus with ketoacidosis without coma: Principal | ICD-10-CM | POA: Diagnosis present

## 2021-05-27 DIAGNOSIS — Z79899 Other long term (current) drug therapy: Secondary | ICD-10-CM

## 2021-05-27 DIAGNOSIS — Z885 Allergy status to narcotic agent status: Secondary | ICD-10-CM | POA: Diagnosis not present

## 2021-05-27 DIAGNOSIS — Z743 Need for continuous supervision: Secondary | ICD-10-CM | POA: Diagnosis not present

## 2021-05-27 DIAGNOSIS — R110 Nausea: Secondary | ICD-10-CM | POA: Diagnosis not present

## 2021-05-27 DIAGNOSIS — E876 Hypokalemia: Secondary | ICD-10-CM | POA: Diagnosis not present

## 2021-05-27 DIAGNOSIS — R197 Diarrhea, unspecified: Secondary | ICD-10-CM | POA: Diagnosis not present

## 2021-05-27 LAB — BASIC METABOLIC PANEL
Anion gap: 24 — ABNORMAL HIGH (ref 5–15)
Anion gap: 17 — ABNORMAL HIGH (ref 5–15)
BUN: 7 mg/dL (ref 6–20)
BUN: 7 mg/dL (ref 6–20)
CO2: 28 mmol/L (ref 22–32)
CO2: 31 mmol/L (ref 22–32)
Calcium: 7 mg/dL — ABNORMAL LOW (ref 8.9–10.3)
Calcium: 6.7 mg/dL — ABNORMAL LOW (ref 8.9–10.3)
Chloride: 80 mmol/L — ABNORMAL LOW (ref 98–111)
Chloride: 85 mmol/L — ABNORMAL LOW (ref 98–111)
Creatinine, Ser: 0.87 mg/dL (ref 0.61–1.24)
Creatinine, Ser: 0.8 mg/dL (ref 0.61–1.24)
GFR, Estimated: >60 mL/min (ref >60)
GFR, Estimated: >60 mL/min (ref >60)
Glucose, Bld: 455 mg/dL — ABNORMAL HIGH (ref 70–99)
Glucose, Bld: 214 mg/dL — ABNORMAL HIGH (ref 70–99)
Potassium: 2.7 mmol/L — CL (ref 3.5–5.1)
Potassium: 2.9 mmol/L — ABNORMAL LOW (ref 3.5–5.1)
Sodium: 132 mmol/L — ABNORMAL LOW (ref 135–145)
Sodium: 133 mmol/L — ABNORMAL LOW (ref 135–145)

## 2021-05-27 LAB — CBG MONITORING, ED
Glucose-Capillary: 434 mg/dL — ABNORMAL HIGH (ref 70–99)
Glucose-Capillary: 256 mg/dL — ABNORMAL HIGH (ref 70–99)
Glucose-Capillary: 236 mg/dL — ABNORMAL HIGH (ref 70–99)

## 2021-05-27 LAB — BLOOD GAS, VENOUS
Acid-Base Excess: 25.1 mmol/L — ABNORMAL HIGH (ref 0.0–2.0)
Bicarbonate: 51.9 mmol/L — ABNORMAL HIGH (ref 20.0–28.0)
O2 Saturation: 82.8 %
Patient temperature: 37
pCO2, Ven: 58 mmHg (ref 44.0–60.0)
pH, Ven: 7.56 — ABNORMAL HIGH (ref 7.250–7.430)
pO2, Ven: 40 mmHg (ref 32.0–45.0)

## 2021-05-27 LAB — GASTROINTESTINAL PANEL BY PCR, STOOL (REPLACES STOOL CULTURE)

## 2021-05-27 LAB — CBC
HCT: 41 % (ref 39.0–52.0)
Hemoglobin: 15 g/dL (ref 13.0–17.0)
MCH: 34.2 pg — ABNORMAL HIGH (ref 26.0–34.0)
MCHC: 36.6 g/dL — ABNORMAL HIGH (ref 30.0–36.0)
MCV: 93.4 fL (ref 80.0–100.0)
Platelets: 203 10*3/uL (ref 150–400)
RBC: 4.39 MIL/uL (ref 4.22–5.81)
RDW: 13.1 % (ref 11.5–15.5)
WBC: 10.4 10*3/uL (ref 4.0–10.5)
nRBC: 0.2 % (ref 0.0–0.2)

## 2021-05-27 LAB — ETHANOL: Alcohol, Ethyl (B): <10 mg/dL (ref <10)

## 2021-05-27 LAB — RESP PANEL BY RT-PCR (FLU A&B, COVID) ARPGX2
Influenza A by PCR: NEGATIVE
Influenza B by PCR: NEGATIVE
SARS Coronavirus 2 by RT PCR: NEGATIVE

## 2021-05-27 LAB — MAGNESIUM: Magnesium: 1.2 mg/dL — ABNORMAL LOW (ref 1.7–2.4)

## 2021-05-27 LAB — C DIFFICILE QUICK SCREEN W PCR REFLEX
C Diff antigen: NEGATIVE
C Diff interpretation: NOT DETECTED
C Diff toxin: NEGATIVE

## 2021-05-27 LAB — BETA-HYDROXYBUTYRIC ACID: Beta-Hydroxybutyric Acid: 0.34 mmol/L — ABNORMAL HIGH (ref 0.05–0.27)

## 2021-05-27 MED ORDER — POTASSIUM CHLORIDE CRYS ER 20 MEQ PO TBCR: 20.0000 meq | EXTENDED_RELEASE_TABLET | Freq: Every day | ORAL | Status: DC

## 2021-05-27 MED ORDER — INSULIN DETEMIR 100 UNIT/ML ~~LOC~~ SOLN
9.0000 [IU] | Freq: Two times a day (BID) | SUBCUTANEOUS | Status: DC
  Filled 2021-05-27 (×2): qty 0.09

## 2021-05-27 MED ORDER — THIAMINE HCL 100 MG PO TABS
100.0000 mg | ORAL_TABLET | Freq: Every day | ORAL | Status: DC
  Administered 2021-05-28 – 2021-05-29 (×2): 100 mg via ORAL
  Filled 2021-05-27 (×2): qty 1

## 2021-05-27 MED ORDER — INSULIN ASPART 100 UNIT/ML IJ SOLN
0.0000 [IU] | Freq: Three times a day (TID) | INTRAMUSCULAR | Status: DC
  Administered 2021-05-28: 9 [IU] via SUBCUTANEOUS
  Administered 2021-05-28: 2 [IU] via SUBCUTANEOUS
  Filled 2021-05-27 (×2): qty 1

## 2021-05-27 MED ORDER — POTASSIUM CHLORIDE IN NACL 20-0.9 MEQ/L-% IV SOLN
Freq: Once | INTRAVENOUS | Status: AC
  Filled 2021-05-27: qty 1000

## 2021-05-27 MED ORDER — APIXABAN 5 MG PO TABS
5.0000 mg | ORAL_TABLET | Freq: Two times a day (BID) | ORAL | Status: DC
  Administered 2021-05-27 – 2021-05-29 (×4): 5 mg via ORAL
  Filled 2021-05-27 (×4): qty 1

## 2021-05-27 MED ORDER — HYDROXYZINE HCL 25 MG PO TABS
12.5000 mg | ORAL_TABLET | Freq: Two times a day (BID) | ORAL | Status: DC | PRN
  Filled 2021-05-27: qty 1

## 2021-05-27 MED ORDER — ADULT MULTIVITAMIN W/MINERALS CH
1.0000 | ORAL_TABLET | Freq: Every day | ORAL | Status: DC
  Administered 2021-05-27 – 2021-05-29 (×3): 1 via ORAL
  Filled 2021-05-27 (×3): qty 1

## 2021-05-27 MED ORDER — FUROSEMIDE 40 MG PO TABS: 40.0000 mg | ORAL_TABLET | Freq: Every day | ORAL | Status: DC

## 2021-05-27 MED ORDER — MAGNESIUM SULFATE 2 GM/50ML IV SOLN
2.0000 g | Freq: Once | INTRAVENOUS | Status: AC
  Administered 2021-05-27: 2 g via INTRAVENOUS
  Filled 2021-05-27: qty 50

## 2021-05-27 MED ORDER — THIAMINE HCL 100 MG/ML IJ SOLN
100.0000 mg | Freq: Once | INTRAMUSCULAR | Status: AC
  Administered 2021-05-27: 100 mg via INTRAVENOUS
  Filled 2021-05-27: qty 2

## 2021-05-27 MED ORDER — INSULIN REGULAR HUMAN 100 UNIT/ML IJ SOLN: 5.0000 [IU] | INTRAMUSCULAR | Status: DC

## 2021-05-27 MED ORDER — METOPROLOL TARTRATE 50 MG PO TABS
100.0000 mg | ORAL_TABLET | Freq: Two times a day (BID) | ORAL | Status: DC
  Administered 2021-05-27 – 2021-05-29 (×4): 100 mg via ORAL
  Filled 2021-05-27 (×4): qty 2

## 2021-05-27 MED ORDER — ONDANSETRON HCL 4 MG/2ML IJ SOLN: 4.0000 mg | Freq: Four times a day (QID) | INTRAMUSCULAR | Status: DC | PRN

## 2021-05-27 MED ORDER — LOPERAMIDE HCL 2 MG PO CAPS
2.0000 mg | ORAL_CAPSULE | Freq: Four times a day (QID) | ORAL | Status: DC | PRN
  Administered 2021-05-27 – 2021-05-28 (×2): 2 mg via ORAL
  Filled 2021-05-27 (×2): qty 1

## 2021-05-27 MED ORDER — POTASSIUM CHLORIDE 20 MEQ PO PACK
40.0000 meq | PACK | Freq: Every day | ORAL | Status: DC
  Filled 2021-05-27: qty 2

## 2021-05-27 MED ORDER — VITAMIN D3 25 MCG (1000 UNIT) PO TABS
1000.0000 [IU] | ORAL_TABLET | Freq: Every day | ORAL | Status: DC
  Administered 2021-05-27 – 2021-05-29 (×3): 1000 [IU] via ORAL
  Filled 2021-05-27 (×5): qty 1

## 2021-05-27 MED ORDER — PANTOPRAZOLE SODIUM 40 MG PO TBEC
40.0000 mg | DELAYED_RELEASE_TABLET | Freq: Two times a day (BID) | ORAL | Status: DC
  Administered 2021-05-28 – 2021-05-29 (×3): 40 mg via ORAL
  Filled 2021-05-27 (×3): qty 1

## 2021-05-27 MED ORDER — ENALAPRIL MALEATE 10 MG PO TABS: 20.0000 mg | ORAL_TABLET | Freq: Every day | ORAL | Status: DC

## 2021-05-27 MED ORDER — PRAVASTATIN SODIUM 20 MG PO TABS
80.0000 mg | ORAL_TABLET | Freq: Every day | ORAL | Status: DC
  Administered 2021-05-27 – 2021-05-29 (×3): 80 mg via ORAL
  Filled 2021-05-27: qty 4
  Filled 2021-05-27 (×2): qty 2
  Filled 2021-05-27: qty 4

## 2021-05-27 MED ORDER — LORAZEPAM 1 MG PO TABS: 1.0000 mg | ORAL_TABLET | ORAL | Status: DC | PRN

## 2021-05-27 MED ORDER — POTASSIUM CHLORIDE 20 MEQ PO PACK
60.0000 meq | PACK | Freq: Every day | ORAL | Status: DC
  Administered 2021-05-27 – 2021-05-28 (×2): 60 meq via ORAL
  Filled 2021-05-27 (×2): qty 3

## 2021-05-27 MED ORDER — SODIUM CHLORIDE 0.9 % IV SOLN: INTRAVENOUS | Status: DC

## 2021-05-27 MED ORDER — INSULIN NPH (HUMAN) (ISOPHANE) 100 UNIT/ML ~~LOC~~ SUSP: 9.0000 [IU] | Freq: Two times a day (BID) | SUBCUTANEOUS | Status: DC

## 2021-05-27 MED ORDER — FOLIC ACID 1 MG PO TABS
1.0000 mg | ORAL_TABLET | Freq: Every day | ORAL | Status: DC
  Administered 2021-05-28 – 2021-05-29 (×2): 1 mg via ORAL
  Filled 2021-05-27 (×2): qty 1

## 2021-05-27 MED ORDER — INSULIN ASPART 100 UNIT/ML IJ SOLN
0.0000 [IU] | Freq: Every day | INTRAMUSCULAR | Status: DC
Start: 2021-05-27 — End: 2021-05-29
  Administered 2021-05-27: 2 [IU] via SUBCUTANEOUS
  Filled 2021-05-27: qty 1

## 2021-05-27 MED ORDER — FOLIC ACID 1 MG PO TABS
1.0000 mg | ORAL_TABLET | Freq: Once | ORAL | Status: AC
  Administered 2021-05-27: 1 mg via ORAL
  Filled 2021-05-27: qty 1

## 2021-05-27 MED ORDER — MAGNESIUM OXIDE -MG SUPPLEMENT 400 (240 MG) MG PO TABS
400.0000 mg | ORAL_TABLET | Freq: Every day | ORAL | Status: DC
  Administered 2021-05-28 – 2021-05-29 (×2): 400 mg via ORAL
  Filled 2021-05-27 (×2): qty 1

## 2021-05-27 MED ORDER — LORAZEPAM 2 MG/ML IJ SOLN
1.0000 mg | INTRAMUSCULAR | Status: DC | PRN
  Administered 2021-05-28: 2 mg via INTRAVENOUS
  Filled 2021-05-27: qty 1

## 2021-05-27 MED ORDER — LOPERAMIDE HCL 2 MG PO CAPS
2.0000 mg | ORAL_CAPSULE | Freq: Once | ORAL | Status: AC
  Administered 2021-05-27: 2 mg via ORAL
  Filled 2021-05-27: qty 1

## 2021-05-27 MED ORDER — INSULIN NPH (HUMAN) (ISOPHANE) 100 UNIT/ML ~~LOC~~ SUSP
9.0000 [IU] | Freq: Every day | SUBCUTANEOUS | Status: DC
  Filled 2021-05-27: qty 10

## 2021-05-27 MED ORDER — ONDANSETRON HCL 4 MG/2ML IJ SOLN
4.0000 mg | Freq: Once | INTRAMUSCULAR | Status: AC
  Administered 2021-05-27: 4 mg via INTRAVENOUS
  Filled 2021-05-27: qty 2

## 2021-05-27 NOTE — ED Provider Notes (Signed)
[facility] Emergency Department Provider Note  ____________________________________________   Event Date/Time   First MD Initiated Contact with Patient 05/27/21 1457     (approximate)  I have reviewed the triage vital signs and the nursing notes.   HISTORY  Chief Complaint Hyperglycemia    HPI Russell Gomez is a 60 y.o. male with A. fib on Eliquis, type 1 diabetes on insulin who comes in with concerns for elevated sugar.  Patient reports having 4 days of nausea, vomiting, diarrhea.  He states that today he is not having diarrhea and the vomiting is now stopped.  Does still feel little nauseous.  Denies any abdominal pain, chest pain, shortness of breath, leg swelling.  States that he has been taking his insulin but today he noticed that his sugar was over 600 therefore presented to the emergency room.  He reports feeling very weak, constant, worse with standing, better at rest.  Denies any falls.  Patient does report daily alcohol drinking.  Last drink yesterday.  States that when he withdrawals he is only had some shakes but otherwise denies any severe withdrawals in the past.            Past Medical History:  Diagnosis Date   Arthritis    on operative finger   Dental crowns present    Diabetes type 1, uncontrolled ([place])    IDDM (Dr. Eddie Dibbles @ [facility])   Esophageal reflux    Goiter 04/2012   no current med, has yearly monitoring   High cholesterol    HTN (hypertension)    under control with meds, has been on med x 2 yr.    Patient Active Problem List   Diagnosis Date Noted   Hypophosphatemia    Hypocalcemia 12/27/2020   Hypomagnesemia 12/27/2020   Alcohol use disorder, moderate, dependence ([place]) 12/27/2020   Acute gastroenteritis 12/27/2020   Atrial fibrillation with RVR ([place]) 12/27/2020   Elevated troponin 12/27/2020   Dizziness 12/10/2020   Irregular heart rhythm 12/10/2020   AAA (abdominal aortic aneurysm) ([place]) 05/27/2020   Transaminitis  05/22/2020   Thiamine deficiency 04/28/2020   Acute diarrhea 04/25/2020   Alcohol abuse 04/25/2020   Macrocytic anemia 04/19/2020   Abnormal EKG 04/19/2020   Proteinuria 04/19/2020   Vitamin B12 deficiency 04/19/2020   Vitamin D deficiency 04/19/2020   Folate deficiency 04/19/2020   Hyperparathyroidism ([place]) 04/19/2020   Psychosocial stressors 04/27/2019   Pedal edema 04/08/2017   Abnormal thyroid function test 02/13/2017   Hypokalemia 08/27/2015   Encounter for general adult medical examination with abnormal findings 01/23/2014   Cough 01/09/2014   Nausea vomiting and diarrhea 01/09/2014   Colloid thyroid nodule 05/05/2012   Erectile dysfunction 04/19/2012   Diabetes mellitus type 1, controlled ([place]) 10/22/2010   Hyperlipidemia due to type 1 diabetes mellitus ([place]) 10/22/2010   TOBACCO ABUSE 10/22/2010   Essential hypertension 10/22/2010   GERD 10/22/2010    Past Surgical History:  Procedure Laterality Date   DISTAL INTERPHALANGEAL JOINT FUSION Right 03/05/2014   Procedure: DEBRIDEMENT (DIP) DISTAL INTERPHALANGEAL RIGHT MIDDLE FINGER;  Surgeon: Wynonia Sours, MD;  Location: [place];  Service: Orthopedics;  Laterality: Right;   KNEE ARTHROSCOPY     MASS EXCISION Right 03/05/2014   Procedure: EXCISION CYST ;  Surgeon: Wynonia Sours, MD;  Location: [place];  Service: Orthopedics;  Laterality: Right;  ANESTHESIA: IV REGINAL FAB   OPEN REDUCTION NASAL FRACTURE  12/26/2008   with closure of nasal lac.  ORIF DISTAL RADIUS FRACTURE Right 12/26/2008   ORIF WRIST FRACTURE Left 05/12/2016   takedown of nonunion/malunion and OPEN REDUCTION INTERNAL FIXATION (ORIF) WRIST FRACTURE;  Surgeon: Corky Mull, MD   PERCUTANEOUS PINNING Left 02/06/2013   Procedure: PINNING PIP OF THE LEFT MIDDLE FINGER ;  Surgeon: Wynonia Sours, MD;  Location: [place];  Service: Orthopedics;  Laterality: Left;   SKIN GRAFT SPLIT THICKNESS LEG / FOOT Right 1985    thigh after trauma vs machine at work   TRIGGER FINGER RELEASE  11/28/2012   Procedure: RELEASE TRIGGER FINGER/A-1 PULLEY;  Surgeon: Wynonia Sours, MD;  Laterality: Left;  EXCISION MASS LEFT Gomez FINGER, RELEASE A-1 PULLEY LEFT Gomez FINGER (ganglion cyst)    Prior to Admission medications   Medication Sig Start Date End Date Taking? Authorizing Provider  apixaban (ELIQUIS) 5 MG TABS tablet Take 1 tablet (5 mg total) by mouth 2 (two) times daily. 02/10/21   Minna Merritts, MD  Cholecalciferol (VITAMIN D3) 25 MCG (1000 UT) CAPS Take 1 capsule (1,000 Units total) by mouth daily. 01/14/21   Ria Bush, MD  enalapril (VASOTEC) 20 MG tablet Take 1 tablet (20 mg total) by mouth daily. 01/14/21   Ria Bush, MD  folic acid (FOLVITE) 1 MG tablet Take 1 tablet by mouth once daily 05/01/21   Ria Bush, MD  furosemide (LASIX) 40 MG tablet Take 1 tablet (40 mg total) by mouth daily. 01/14/21   Ria Bush, MD  glucose blood test strip Use 6 (six) times daily 01/16/18   [provider]  hydrOXYzine (ATARAX/VISTARIL) 25 MG tablet Take 0.5-1 tablets (12.5-25 mg total) by mouth 2 (two) times daily as needed for anxiety or nausea (sedation precautions). 02/25/21   Ria Bush, MD  insulin NPH Human (NOVOLIN N) 100 UNIT/ML injection Inject 9 Units into the skin 2 (two) times daily.    [provider]  insulin regular (NOVOLIN R) 100 units/mL injection Inject 5-9 Units into the skin as directed. Sliding scale as needed.    [provider]  Magnesium 400 MG TABS Take 1 tablet by mouth daily. 01/18/21   Ria Bush, MD  metoprolol tartrate (LOPRESSOR) 100 MG tablet Take 1 tablet (100 mg total) by mouth 2 (two) times daily. 12/31/20   Minna Merritts, MD  oxazepam (SERAX) 10 MG capsule Take 1 capsule (10 mg total) by mouth 2 (two) times daily as needed (alcohol withdrawal symptoms). 02/25/21   Ria Bush, MD  pantoprazole (PROTONIX) 40 MG tablet Take 1  tablet (40 mg total) by mouth 2 (two) times daily before a meal. Patient not taking: Reported on 01/14/2021 12/15/20   Ria Bush, MD  potassium chloride SA (KLOR-CON M20) 20 MEQ tablet Take 1 tablet (20 mEq total) by mouth daily. 04/30/21   Hinda Kehr, MD  pravastatin (PRAVACHOL) 80 MG tablet Take 1 tablet by mouth once daily 05/01/21   Ria Bush, MD    Allergies Insulin aspart, Spironolactone, Hydrochlorothiazide, and Morphine  Family History  Problem Relation Age of Onset   Healthy Father    Hypertension Mother    Hyperlipidemia Mother    Diabetes Maternal Uncle    Coronary artery disease Maternal Uncle    Stroke Maternal Aunt    Diabetes Paternal Grandmother    Diabetes Paternal Uncle     Social History Social History   Tobacco Use   Smoking status: Every Day    Packs/day: 1.00    Years: 20.00    Pack  years: 20.00    Types: Cigarettes   Smokeless tobacco: Never   Tobacco comments:    smokes 1 pack a day  Vaping Use   Vaping Use: Former  Substance Use Topics   Alcohol use: Yes    Alcohol/week: 7.0 standard drinks    Types: 7 Cans of beer per week    Comment: 1-2 drinks per day   Drug use: No      Review of Systems Constitutional: No fever/chills, elevated sugar Eyes: No visual changes. ENT: No sore throat. Cardiovascular: Denies chest pain. Respiratory: Denies shortness of breath. Gastrointestinal: Nausea vomiting diarrhea Genitourinary: Negative for dysuria. Musculoskeletal: Negative for back pain. Skin: Negative for rash. Neurological: Negative for headaches, focal weakness or numbness. All other ROS negative ____________________________________________   PHYSICAL EXAM:  VITAL SIGNS: ED Triage Vitals  Enc Vitals Group     BP 05/27/21 1134 108/70     Pulse Rate 05/27/21 1134 94     Resp 05/27/21 1134 20     Temp 05/27/21 1134 98.5 F (36.9 C)     Temp Source 05/27/21 1134 Oral     SpO2 05/27/21 1134 95 %     Weight 05/27/21 1127  179 lb 14.3 oz (81.6 kg)     Height 05/27/21 1127 6\' 3"  (1.905 m)     Head Circumference --      Peak Flow --      Pain Score 05/27/21 1127 0     Pain Loc --      Pain Edu? --      Excl. in [place]? --     Constitutional: Alert and oriented. Well appearing and in no acute distress. Eyes: Conjunctivae are normal. EOMI. Head: Atraumatic. Nose: No congestion/rhinnorhea. Mouth/Throat: Mucous membranes are dry Neck: No stridor. Trachea Midline. FROM Cardiovascular: Irregular, tachycardic grossly normal heart sounds.  Good peripheral circulation. Respiratory: Normal respiratory effort.  No retractions. Lungs CTAB. Gastrointestinal: Soft and nontender. No distention. No abdominal bruits.  Musculoskeletal: No lower extremity tenderness nor edema.  No joint effusions. Neurologic:  Normal speech and language. No gross focal neurologic deficits are appreciated.  Skin:  Skin is warm, dry and intact. No rash noted. Psychiatric: Mood and affect are normal. Speech and behavior are normal. GU: Deferred   ____________________________________________   LABS (all labs ordered are listed, but only abnormal results are displayed)  Labs Reviewed  BASIC METABOLIC PANEL - Abnormal; Notable for the following components:      Result Value   Sodium 132 (*)    Potassium 2.7 (*)    Chloride 80 (*)    Glucose, Bld 455 (*)    Calcium 7.0 (*)    Anion gap 24 (*)    All other components within normal limits  CBC - Abnormal; Notable for the following components:   MCH 34.2 (*)    MCHC 36.6 (*)    All other components within normal limits  CBG MONITORING, ED - Abnormal; Notable for the following components:   Glucose-Capillary 434 (*)    All other components within normal limits  URINALYSIS, COMPLETE (UACMP) WITH MICROSCOPIC  BETA-HYDROXYBUTYRIC ACID  CBG MONITORING, ED   ____________________________________________   ED ECG REPORT I, Vanessa [place], the attending physician, personally viewed and  interpreted this ECG.  A lot of artifact but appears atrial fibrillation difficult to interpret and we will get a repeat EKG  Repeat EKG is atrial fibrillation rate of 106, no ST elevation, no T wave inversions, normal intervals ____________________________________________  PROCEDURES  Procedure(s) performed (including Critical Care):  .1-3 Lead EKG Interpretation  Date/Time: 05/27/2021 3:23 PM Performed by: Vanessa [place], MD Authorized by: Vanessa [place], MD     Interpretation: abnormal     ECG rate:  100s   ECG rate assessment: tachycardic     Rhythm: atrial fibrillation     Ectopy: none     Conduction: normal     ____________________________________________   INITIAL IMPRESSION / ASSESSMENT AND PLAN / ED COURSE  Russell Gomez was evaluated in Emergency Department on 05/27/2021 for the symptoms described in the history of present illness. He was evaluated in the context of the global COVID-19 pandemic, which necessitated consideration that the patient might be at risk for infection with the SARS-CoV-2 virus that causes COVID-19. Institutional protocols and algorithms that pertain to the evaluation of patients at risk for COVID-19 are in a state of rapid change based on information released by regulatory bodies including the CDC and federal and state organizations. These policies and algorithms were followed during the patient's care in the ED.    Patient is a 60 year old with diabetes who comes in with tachycardia nausea, vomiting, diarrhea.  Labs ordered to evaluate for [place] abnormalities, AKI, DKA.  Suspect that this could be a gastroenteritis that caused him to have elevated sugars.  Patient reports being compliant.  Also consider alcoholic ketoacidosis given last drink was yesterday.  We will add on ethanol level.  Labs show a potassium of 2.7.  We will give both IV and oral repletion in order to get his potassium over 3.5 for IV insulin.     Patient's VBG shows  alkalosis and metabolic alkalosis probably from all the vomiting and diarrhea.  Making DKA less likely.  After fluids patient's anion gap was already starting to close.  However patient is still pretty symptomatic continued to have diarrhea and weakness therefore will discuss hospital team for admission. Repeat abdominal exam remains soft and non tender.      ____________________________________________   FINAL CLINICAL IMPRESSION(S) / ED DIAGNOSES   Final diagnoses:  Hypomagnesemia  Nausea vomiting and diarrhea  Hypokalemia      MEDICATIONS GIVEN DURING THIS VISIT:  Medications  potassium chloride (KLOR-CON) packet 60 mEq (60 mEq Oral Given 05/27/21 1520)  metoprolol tartrate (LOPRESSOR) tablet 100 mg (has no administration in time range)  pravastatin (PRAVACHOL) tablet 80 mg (80 mg Oral Given 05/27/21 1955)  hydrOXYzine (ATARAX/VISTARIL) tablet 12.5-25 mg (has no administration in time range)  pantoprazole (PROTONIX) EC tablet 40 mg (has no administration in time range)  apixaban (ELIQUIS) tablet 5 mg (has no administration in time range)  folic acid (FOLVITE) tablet 1 mg (1 mg Oral Not Given 05/27/21 1934)  cholecalciferol (VITAMIN D) tablet 1,000 Units (1,000 Units Oral Given 05/27/21 1955)  magnesium oxide (MAG-OX) tablet 400 mg (has no administration in time range)  thiamine tablet 100 mg (100 mg Oral Not Given 05/27/21 1935)  0.9 %  sodium chloride infusion ( Intravenous New Bag/Given 05/27/21 1954)  LORazepam (ATIVAN) tablet 1-4 mg (has no administration in time range)    Or  LORazepam (ATIVAN) injection 1-4 mg (has no administration in time range)  multivitamin with minerals tablet 1 tablet (1 tablet Oral Given 05/27/21 1955)  ondansetron (ZOFRAN) injection 4 mg (has no administration in time range)  insulin aspart (novoLOG) injection 0-9 Units (has no administration in time range)  insulin aspart (novoLOG) injection 0-5 Units (has no administration in time range)  0.9 %  NaCl  with KCl 20 mEq/ L  infusion ( Intravenous Stopped 05/27/21 1716)  ondansetron (ZOFRAN) injection 4 mg (4 mg Intravenous Given 05/27/21 1542)  thiamine (B-1) injection 100 mg (100 mg Intravenous Given 07/12/99 9233)  folic acid (FOLVITE) tablet 1 mg (1 mg Oral Given 05/27/21 1545)  loperamide (IMODIUM) capsule 2 mg (2 mg Oral Given 05/27/21 1726)  magnesium sulfate IVPB 2 g 50 mL (0 g Intravenous Stopped 05/27/21 1955)     ED Discharge Orders     None        Note:  This document was prepared using Dragon voice recognition software and may include unintentional dictation errors.    Vanessa Magn[place], MD 05/27/21 2041

## 2021-05-27 NOTE — Telephone Encounter (Signed)
Noted. Currently in ER. Will see on Tuesday
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
## 2021-05-27 NOTE — ED Notes (Signed)
Pt c/o nausea, [name], RN stated pt has already had meds for it. Pt informed that we could not give him anything at this time because he has already had meds. Pt stated, "I have not had anything yet, EMS did not give me nothing."

## 2021-05-27 NOTE — ED Triage Notes (Signed)
Arrives via [facility]. C?O N/V x 4 days.  CBG at home was 'Hi".  EMS checked CBG:  556.  VS wnl.

## 2021-05-27 NOTE — Telephone Encounter (Signed)
Noted  

## 2021-05-27 NOTE — ED Notes (Signed)
Pt states he typically has 3 "good sized" mixed drinks every morning after coming home from 3rd shift work. He started having diarrhea about 3 days ago, has not eaten in 4 days, and just started drinking juice yesterday. He reports some ETOH this morning but not as much as usual. Denies s/s of withdrawal at this time.

## 2021-05-28 ENCOUNTER — Encounter: Payer: Self-pay | Admitting: Family Medicine

## 2021-05-28 ENCOUNTER — Other Ambulatory Visit: Payer: Self-pay

## 2021-05-28 DIAGNOSIS — E876 Hypokalemia: Secondary | ICD-10-CM

## 2021-05-28 DIAGNOSIS — E872 Acidosis, unspecified: Secondary | ICD-10-CM

## 2021-05-28 DIAGNOSIS — I480 Paroxysmal atrial fibrillation: Secondary | ICD-10-CM

## 2021-05-28 DIAGNOSIS — E785 Hyperlipidemia, unspecified: Secondary | ICD-10-CM

## 2021-05-28 DIAGNOSIS — E1065 Type 1 diabetes mellitus with hyperglycemia: Secondary | ICD-10-CM | POA: Diagnosis not present

## 2021-05-28 DIAGNOSIS — F1010 Alcohol abuse, uncomplicated: Secondary | ICD-10-CM

## 2021-05-28 DIAGNOSIS — E440 Moderate protein-calorie malnutrition: Secondary | ICD-10-CM | POA: Insufficient documentation

## 2021-05-28 LAB — BASIC METABOLIC PANEL
Anion gap: 14 (ref 5–15)
Anion gap: 8 (ref 5–15)
BUN: 6 mg/dL (ref 6–20)
BUN: 6 mg/dL (ref 6–20)
CO2: 37 mmol/L — ABNORMAL HIGH (ref 22–32)
CO2: 36 mmol/L — ABNORMAL HIGH (ref 22–32)
Calcium: 6.8 mg/dL — ABNORMAL LOW (ref 8.9–10.3)
Calcium: 6.6 mg/dL — ABNORMAL LOW (ref 8.9–10.3)
Chloride: 85 mmol/L — ABNORMAL LOW (ref 98–111)
Chloride: 90 mmol/L — ABNORMAL LOW (ref 98–111)
Creatinine, Ser: 0.67 mg/dL (ref 0.61–1.24)
Creatinine, Ser: 0.79 mg/dL (ref 0.61–1.24)
GFR, Estimated: >60 mL/min (ref >60)
GFR, Estimated: >60 mL/min (ref >60)
Glucose, Bld: 90 mg/dL (ref 70–99)
Glucose, Bld: 51 mg/dL — ABNORMAL LOW (ref 70–99)
Potassium: 3.1 mmol/L — ABNORMAL LOW (ref 3.5–5.1)
Potassium: 2.8 mmol/L — ABNORMAL LOW (ref 3.5–5.1)
Sodium: 136 mmol/L (ref 135–145)
Sodium: 134 mmol/L — ABNORMAL LOW (ref 135–145)

## 2021-05-28 LAB — URINALYSIS, COMPLETE (UACMP) WITH MICROSCOPIC
Bacteria, UA: NONE SEEN
Bilirubin Urine: NEGATIVE
Glucose, UA: NEGATIVE mg/dL
Ketones, ur: NEGATIVE mg/dL
Leukocytes,Ua: NEGATIVE
Nitrite: NEGATIVE
Protein, ur: NEGATIVE mg/dL
Specific Gravity, Urine: 1.004 — ABNORMAL LOW (ref 1.005–1.030)
Squamous Epithelial / HPF: NONE SEEN (ref 0–5)
pH: 7 (ref 5.0–8.0)

## 2021-05-28 LAB — MAGNESIUM: Magnesium: 1.7 mg/dL (ref 1.7–2.4)

## 2021-05-28 LAB — CBC
HCT: 39.6 % (ref 39.0–52.0)
Hemoglobin: 14 g/dL (ref 13.0–17.0)
MCH: 34.1 pg — ABNORMAL HIGH (ref 26.0–34.0)
MCHC: 35.4 g/dL (ref 30.0–36.0)
MCV: 96.4 fL (ref 80.0–100.0)
Platelets: 198 10*3/uL (ref 150–400)
RBC: 4.11 MIL/uL — ABNORMAL LOW (ref 4.22–5.81)
RDW: 13.3 % (ref 11.5–15.5)
WBC: 9 10*3/uL (ref 4.0–10.5)
nRBC: 0 % (ref 0.0–0.2)

## 2021-05-28 LAB — PHOSPHORUS: Phosphorus: 2.1 mg/dL — ABNORMAL LOW (ref 2.5–4.6)

## 2021-05-28 LAB — GLUCOSE, CAPILLARY
Glucose-Capillary: 101 mg/dL — ABNORMAL HIGH (ref 70–99)
Glucose-Capillary: 112 mg/dL — ABNORMAL HIGH (ref 70–99)
Glucose-Capillary: 139 mg/dL — ABNORMAL HIGH (ref 70–99)
Glucose-Capillary: 161 mg/dL — ABNORMAL HIGH (ref 70–99)
Glucose-Capillary: 224 mg/dL — ABNORMAL HIGH (ref 70–99)
Glucose-Capillary: 332 mg/dL — ABNORMAL HIGH (ref 70–99)
Glucose-Capillary: 37 mg/dL — CL (ref 70–99)
Glucose-Capillary: 429 mg/dL — ABNORMAL HIGH (ref 70–99)

## 2021-05-28 LAB — URINE DRUG SCREEN, QUALITATIVE (ARMC ONLY)
Amphetamines, Ur Screen: NOT DETECTED
Barbiturates, Ur Screen: NOT DETECTED
Benzodiazepine, Ur Scrn: NOT DETECTED
Cannabinoid 50 Ng, Ur ~~LOC~~: POSITIVE — AB
Cocaine Metabolite,Ur ~~LOC~~: NOT DETECTED
MDMA (Ecstasy)Ur Screen: NOT DETECTED
Methadone Scn, Ur: NOT DETECTED
Opiate, Ur Screen: NOT DETECTED
Phencyclidine (PCP) Ur S: NOT DETECTED
Tricyclic, Ur Screen: NOT DETECTED

## 2021-05-28 LAB — HEMOGLOBIN A1C
Hgb A1c MFr Bld: 8.2 % — ABNORMAL HIGH (ref 4.8–5.6)
Mean Plasma Glucose: 188.64 mg/dL

## 2021-05-28 LAB — LACTIC ACID, PLASMA
Lactic Acid, Venous: 3.9 mmol/L (ref 0.5–1.9)
Lactic Acid, Venous: 1.6 mmol/L (ref 0.5–1.9)
Lactic Acid, Venous: 1.3 mmol/L (ref 0.5–1.9)

## 2021-05-28 MED ORDER — POTASSIUM CHLORIDE 10 MEQ/100ML IV SOLN
10.0000 meq | Freq: Once | INTRAVENOUS | Status: AC
  Administered 2021-05-28: 10 meq via INTRAVENOUS
  Filled 2021-05-28: qty 100

## 2021-05-28 MED ORDER — INSULIN DETEMIR 100 UNIT/ML ~~LOC~~ SOLN
10.0000 [IU] | Freq: Two times a day (BID) | SUBCUTANEOUS | Status: DC
  Filled 2021-05-28 (×2): qty 0.1

## 2021-05-28 MED ORDER — POTASSIUM CHLORIDE CRYS ER 20 MEQ PO TBCR
40.0000 meq | EXTENDED_RELEASE_TABLET | Freq: Once | ORAL | Status: AC
  Administered 2021-05-28: 40 meq via ORAL
  Filled 2021-05-28: qty 2

## 2021-05-28 MED ORDER — SODIUM CHLORIDE 0.9 % IV BOLUS
1000.0000 mL | Freq: Once | INTRAVENOUS | Status: AC
  Administered 2021-05-28: 1000 mL via INTRAVENOUS

## 2021-05-28 MED ORDER — MAGNESIUM SULFATE 2 GM/50ML IV SOLN
2.0000 g | Freq: Once | INTRAVENOUS | Status: AC
  Administered 2021-05-28: 2 g via INTRAVENOUS
  Filled 2021-05-28: qty 50

## 2021-05-28 MED ORDER — POTASSIUM PHOSPHATES 15 MMOLE/5ML IV SOLN
30.0000 mmol | Freq: Once | INTRAVENOUS | Status: AC
  Administered 2021-05-28: 30 mmol via INTRAVENOUS
  Filled 2021-05-28: qty 10

## 2021-05-28 MED ORDER — INSULIN DETEMIR 100 UNIT/ML ~~LOC~~ SOLN
12.0000 [IU] | Freq: Two times a day (BID) | SUBCUTANEOUS | Status: DC
  Administered 2021-05-28: 12 [IU] via SUBCUTANEOUS
  Filled 2021-05-28 (×2): qty 0.12

## 2021-05-28 MED ORDER — GLUCERNA SHAKE PO LIQD
237.0000 mL | Freq: Three times a day (TID) | ORAL | Status: DC
Start: 2021-05-28 — End: 2021-05-29
  Administered 2021-05-28 – 2021-05-29 (×2): 237 mL via ORAL

## 2021-05-28 MED ORDER — POTASSIUM CHLORIDE 10 MEQ/100ML IV SOLN
10.0000 meq | INTRAVENOUS | Status: AC
  Administered 2021-05-28: 10 meq via INTRAVENOUS
  Filled 2021-05-28: qty 100

## 2021-05-28 MED ORDER — INSULIN DETEMIR 100 UNIT/ML ~~LOC~~ SOLN
10.0000 [IU] | Freq: Every day | SUBCUTANEOUS | Status: DC
  Filled 2021-05-28: qty 0.1

## 2021-05-28 MED ORDER — INSULIN ASPART 100 UNIT/ML IJ SOLN
5.0000 [IU] | Freq: Three times a day (TID) | INTRAMUSCULAR | Status: DC
  Administered 2021-05-28 (×2): 5 [IU] via SUBCUTANEOUS
  Filled 2021-05-28: qty 1

## 2021-05-28 MED ORDER — SODIUM CHLORIDE 0.9 % IV SOLN
INTRAVENOUS | Status: DC | PRN
  Administered 2021-05-28: 250 mL via INTRAVENOUS

## 2021-05-28 NOTE — Progress Notes (Signed)
Inpatient Diabetes Program Recommendations  AACE/ADA: New Consensus Statement on Inpatient Glycemic Control (2015)  Target Ranges:  Prepandial:   less than 140 mg/dL      Peak postprandial:   less than 180 mg/dL (1-2 hours)      Critically ill patients:  140 - 180 mg/dL   Lab Results  Component Value Date   GLUCAP 429 (H) 05/28/2021   HGBA1C 8.2 (H) 05/28/2021    Review of Glycemic Control Results for Russell Gomez, Russell Gomez (MRN [id number]) as of 05/28/2021 08:41  Ref. Range 05/27/2021 11:41 05/27/2021 17:32 05/27/2021 21:45 05/28/2021 04:19 05/28/2021 08:19  Glucose-Capillary Latest Ref Range: 70 - 99 mg/dL 434 (H) 256 (H) 236 (H) 224 (H) 429 (H)   Diabetes history: DM type 1 Outpatient Diabetes medications: NPH 9 units bid, Novolin Regular insulin 5-9 units tid Current orders for Inpatient glycemic control:  Levemir 9 units bid Novolog 0-9 units tid + hs  Note: Levemir not given last night due to the time being ordered and when pharmacy put it in.first dose scheduled this am.   Inpatient Diabetes Program Recommendations:    - consider getting another BMET, last one was 9 hours ago. Has not had basal insulin in almost 24 hours.  Watch on current regimen. Pt on [employer] insulin due to cost. A1c 8.2% on 7/21  DKA exacerbated by N/V.  Thanks,  Tama Headings RN, MSN, BC-ADM Inpatient Diabetes Coordinator Team Pager [phone] (8a-5p)

## 2021-05-28 NOTE — Plan of Care (Signed)

## 2021-05-28 NOTE — Progress Notes (Signed)
Initial Nutrition Assessment  DOCUMENTATION CODES:  Non-severe (moderate) malnutrition in context of social or environmental circumstances  INTERVENTION:  Continue current diet as order, encouraged PO intake Glucerna Shake po TID, each supplement provides 220 kcal and 10 grams of protein Continue MVI, thiamine, and folic acid daily Request new measured weight  NUTRITION DIAGNOSIS: Moderate Malnutrition (in the context of social/environmental circumstances) related to  (excessive EtOH consumption and lack of nutrient dense foods) as evidenced by mild fat depletion, moderate fat depletion, mild muscle depletion, percent weight loss (26.2% x 12 months).  GOAL: Patient will meet greater than or equal to 90% of their needs  MONITOR:  PO intake, Supplement acceptance, Labs, Weight trends  REASON FOR ASSESSMENT:  Malnutrition Screening Tool    ASSESSMENT:  60 y.o. male with a PMH of A. Fib, DM type 1, tobacco dependence (1.5-2 packs/day), EtOH abuse, HLD, HTN, and GERD. Presented to ED with elevated sbg >600. Reports n/v/d for the past 3-4 days.  Pt reported he drinks daily, 3 "good sized" mixed drinks after work in the morning. Does have mild withdrawals without alcohol.  Last drink was the day of admission  Pt resting in bed at the time of assessment. Lunch tray at bedside, 100% consumed. Pt reports that appetite has been good today. Had some diarrhea this AM, but none this afternoon and nausea/vomiting have resolved. Pt reports that he is eating more here than he normally does at home.   Discussed reported weight loss and poor intake on admission screen. Pt reports that he separated from his spouse about 2.5 years ago and since that time has lost >50 lbs. Reports that his portions have gotten smaller due to lack of interest in food. Pt attributes this to depression, noted appointment with psychiatrist is was scheduled for 7/26 by PCP. Severe weight loss of 26.2% noted in the last 12  months. Encouraged pt to continue eating well and will add nutrition supplements. Discussed eating energy dense snacks at home to help maintain weight.    Nutritionally Relevant Medications: Scheduled Meds:  cholecalciferol  1,000 Units Oral Daily   folic acid  1 mg Oral Daily   insulin aspart  0-5 Units Subcutaneous QHS   insulin aspart  0-9 Units Subcutaneous TID WC   insulin aspart  5 Units Subcutaneous TID WC   insulin detemir  12 Units Subcutaneous BID   magnesium oxide  400 mg Oral Daily   multivitamin with minerals  1 tablet Oral Daily   pantoprazole  40 mg Oral BID AC   potassium chloride  60 mEq Oral Daily   pravastatin  80 mg Oral Daily   thiamine  100 mg Oral Daily   Continuous Infusions:  sodium chloride 150 mL/hr at 05/28/21 [id number]   magnesium sulfate bolus IVPB 2 g (05/28/21 0910)   potassium PHOSPHATE IVPB (in mmol)     PRN Meds: loperamide, ondansetron  Labs Reviewed: K 3.1 Phosphorus 2.1 SBG ranges from 101-429 mg/dL over the last 24 hours HgbA1c 8.2% (7/21)  NUTRITION - FOCUSED PHYSICAL EXAM: Flowsheet Row Most Recent Value  Orbital Region Mild depletion  Upper Arm Region Mild depletion  Thoracic and Lumbar Region Mild depletion  Buccal Region Mild depletion  Temple Region Mild depletion  Clavicle Bone Region No depletion  Clavicle and Acromion Bone Region Mild depletion  Scapular Bone Region No depletion  Dorsal Hand Mild depletion  Patellar Region Moderate depletion  Anterior Thigh Region Moderate depletion  Posterior Calf Region Mild depletion  Edema (  RD Assessment) None  Hair Reviewed  Eyes Reviewed  Mouth Reviewed  Skin Reviewed  Nails Reviewed   Diet Order:   Diet Order             Diet heart healthy/carb modified Room service appropriate? Yes; Fluid consistency: Thin  Diet effective now                   EDUCATION NEEDS:  Education needs have been addressed  Skin:  Skin Assessment: Reviewed RN Assessment  Last BM:  7/21 -  type 7  Height:  Ht Readings from Last 1 Encounters:  05/27/21 '6\' 3"'$  (1.905 m)    Weight:  Wt Readings from Last 1 Encounters:  05/27/21 81.6 kg    Ideal Body Weight:  89.1 kg  BMI:  Body mass index is 22.49 kg/m.  Estimated Nutritional Needs:  Kcal:  2200-2400 kcal/d Protein:  115-130 g/d Fluid:  >2.2 L/d   Ranell Patrick, RD, LDN Clinical Dietitian Pager on [place]

## 2021-05-28 NOTE — Progress Notes (Signed)
6Patient ID: Russell Gomez, male   DOB: 1961-02-18, 60 y.o.   MRN: [id number]  Patient hypoglycemic.  Have had him drink a few juices and will recheck sugar.  Change Levemir to once a day dosing.  Cut off the short acting insulin prior to meals for right now and just go with sliding scale.  Potassium still low.  Receiving K-Phos right now.  We will give another oral dose of potassium and 2 more rounds of KCl.  Recheck electrolytes tomorrow morning.  Dr Loletha Grayer

## 2021-05-28 NOTE — Progress Notes (Signed)
NauseaPatient ID: Russell Gomez, male   DOB: 05-12-1961, 60 y.o.   MRN: [id number] [facility] PROGRESS NOTE  Russell Gomez DOB: 1961-05-05 DOA: 05/27/2021 PCP: Ria Bush, MD  HPI/Subjective: Patient feeling a lot better when I saw him this morning.  Vomiting and diarrhea going on for days.  No blood in the vomit or in the bowel movements.  Had some abdominal pain with diarrhea but none now.  Objective: Vitals:   05/28/21 0822 05/28/21 1154  BP: 122/68 101/67  Pulse: 91 81  Resp: 17 17  Temp: 98.7 F (37.1 C) 98.6 F (37 C)  SpO2: 96% 98%    Intake/Output Summary (Last 24 hours) at 05/28/2021 1458 Last data filed at 05/28/2021 4315 Gross per 24 hour  Intake 1194.93 ml  Output --  Net 1194.93 ml   Filed Weights   05/27/21 1127  Weight: 81.6 kg    ROS: Review of Systems  Respiratory:  Negative for shortness of breath.   Cardiovascular:  Negative for chest pain.  Gastrointestinal:  Negative for abdominal pain, nausea and vomiting.  Exam: Physical Exam HENT:     Head: Normocephalic.     Mouth/Throat:     Pharynx: No oropharyngeal exudate.  Eyes:     General: Lids are normal.     Conjunctiva/sclera: Conjunctivae normal.  Cardiovascular:     Rate and Rhythm: Normal rate and regular rhythm.     Heart sounds: Normal heart sounds, S1 normal and S2 normal.  Pulmonary:     Breath sounds: Examination of the right-lower field reveals decreased breath sounds and rhonchi. Examination of the left-lower field reveals decreased breath sounds and rhonchi. Decreased breath sounds and rhonchi present. No wheezing or rales.  Abdominal:     Palpations: Abdomen is soft.     Tenderness: There is no abdominal tenderness.  Musculoskeletal:     Right lower leg: No swelling.     Left lower leg: No swelling.  Skin:    General: Skin is warm.     Findings: No rash.  Neurological:     Mental Status: He is alert and oriented to person, place, and time.      Data Reviewed: Basic Metabolic Panel: Recent Labs  Lab 05/27/21 1133 05/27/21 1716 05/28/21 0023  NA 132* 133* 136  K 2.7* 2.9* 3.1*  CL 80* 85* 85*  CO2 28 31 37*  GLUCOSE 455* 214* 90  BUN 7 7 6   CREATININE 0.87 0.80 0.67  CALCIUM 7.0* 6.7* 6.8*  MG  --  1.2* 1.7  PHOS  --   --  2.1*    CBC: Recent Labs  Lab 05/27/21 1133 05/28/21 0023  WBC 10.4 9.0  HGB 15.0 14.0  HCT 41.0 39.6  MCV 93.4 96.4  PLT 203 198     CBG: Recent Labs  Lab 05/27/21 2145 05/27/21 2357 05/28/21 0419 05/28/21 0819 05/28/21 1151  GLUCAP 236* 101* 224* 429* 161*    Recent Results (from the past 240 hour(s))  Resp Panel by RT-PCR (Flu A&B, Covid) Nasopharyngeal Swab     Status: None   Collection Time: 05/27/21  3:36 PM   Specimen: Nasopharyngeal Swab; Nasopharyngeal(NP) swabs in vial transport medium  Result Value Ref Range Status   SARS Coronavirus 2 by RT PCR NEGATIVE NEGATIVE Final    Comment: (NOTE) SARS-CoV-2 target nucleic acids are NOT DETECTED.  The SARS-CoV-2 RNA is generally detectable in upper respiratory specimens during the acute phase of infection. The lowest concentration of SARS-CoV-2  viral copies this assay can detect is 138 copies/mL. A negative result does not preclude SARS-Cov-2 infection and should not be used as the sole basis for treatment or other patient management decisions. A negative result may occur with  improper specimen collection/handling, submission of specimen other than nasopharyngeal swab, presence of viral mutation(s) within the areas targeted by this assay, and inadequate number of viral copies(<138 copies/mL). A negative result must be combined with clinical observations, patient history, and epidemiological information. The expected result is Negative.  Fact Sheet for Patients:  EntrepreneurPulse.com.au  Fact Sheet for Healthcare Providers:  IncredibleEmployment.be  This test is no t yet  approved or cleared by the [place] FDA and  has been authorized for detection and/or diagnosis of SARS-CoV-2 by FDA under an Emergency Use Authorization (EUA). This EUA will remain  in effect (meaning this test can be used) for the duration of the COVID-19 declaration under Section 564(b)(1) of the Act, 21 U.S.C.section 360bbb-3(b)(1), unless the authorization is terminated  or revoked sooner.       Influenza A by PCR NEGATIVE NEGATIVE Final   Influenza B by PCR NEGATIVE NEGATIVE Final    Comment: (NOTE) The Xpert Xpress SARS-CoV-2/FLU/RSV plus assay is intended as an aid in the diagnosis of influenza from Nasopharyngeal swab specimens and should not be used as a sole basis for treatment. Nasal washings and aspirates are unacceptable for Xpert Xpress SARS-CoV-2/FLU/RSV testing.  Fact Sheet for Patients: EntrepreneurPulse.com.au  Fact Sheet for Healthcare Providers: IncredibleEmployment.be  This test is not yet approved or cleared by the [place] FDA and has been authorized for detection and/or diagnosis of SARS-CoV-2 by FDA under an Emergency Use Authorization (EUA). This EUA will remain in effect (meaning this test can be used) for the duration of the COVID-19 declaration under Section 564(b)(1) of the Act, 21 U.S.C. section 360bbb-3(b)(1), unless the authorization is terminated or revoked.  Performed at [facility], [address] [place], [place] [address]   Gastrointestinal Panel by PCR , Stool     Status: None   Collection Time: 05/27/21  5:54 PM   Specimen: Stool  Result Value Ref Range Status   Campylobacter species NOT DETECTED NOT DETECTED Final   Plesimonas shigelloides NOT DETECTED NOT DETECTED Final   Salmonella species NOT DETECTED NOT DETECTED Final   Yersinia enterocolitica NOT DETECTED NOT DETECTED Final   Vibrio species NOT DETECTED NOT DETECTED Final   Vibrio cholerae NOT DETECTED NOT DETECTED  Final   Enteroaggregative E coli (EAEC) NOT DETECTED NOT DETECTED Final   Enteropathogenic E coli (EPEC) NOT DETECTED NOT DETECTED Final   Enterotoxigenic E coli (ETEC) NOT DETECTED NOT DETECTED Final   Shiga like toxin producing E coli (STEC) NOT DETECTED NOT DETECTED Final   Shigella/Enteroinvasive E coli (EIEC) NOT DETECTED NOT DETECTED Final   Cryptosporidium NOT DETECTED NOT DETECTED Final   Cyclospora cayetanensis NOT DETECTED NOT DETECTED Final   Entamoeba histolytica NOT DETECTED NOT DETECTED Final   Giardia lamblia NOT DETECTED NOT DETECTED Final   Adenovirus F40/41 NOT DETECTED NOT DETECTED Final   Astrovirus NOT DETECTED NOT DETECTED Final   Norovirus GI/GII NOT DETECTED NOT DETECTED Final   Rotavirus A NOT DETECTED NOT DETECTED Final   Sapovirus (I, II, IV, and V) NOT DETECTED NOT DETECTED Final    Comment: Performed at [facility], [address] [place], [place] [address]  C Difficile Quick Screen w PCR reflex     Status: None   Collection Time:  05/27/21  5:54 PM   Specimen: Stool  Result Value Ref Range Status   C Diff antigen NEGATIVE NEGATIVE Final   C Diff toxin NEGATIVE NEGATIVE Final   C Diff interpretation No C. difficile detected.  Final    Comment: Performed at [facility], [address] [place], [place] [address]     Scheduled Meds:  apixaban  5 mg Oral BID   cholecalciferol  1,000 Units Oral Daily   folic acid  1 mg Oral Daily   insulin aspart  0-5 Units Subcutaneous QHS   insulin aspart  0-9 Units Subcutaneous TID WC   insulin aspart  5 Units Subcutaneous TID WC   insulin detemir  10 Units Subcutaneous BID   magnesium oxide  400 mg Oral Daily   metoprolol tartrate  100 mg Oral BID   multivitamin with minerals  1 tablet Oral Daily   pantoprazole  40 mg Oral BID AC   potassium chloride  60 mEq Oral Daily   pravastatin  80 mg Oral Daily   thiamine  100 mg Oral Daily   Continuous Infusions:  potassium PHOSPHATE IVPB (in mmol)  30 mmol (05/28/21 1236)    Assessment/Plan:  Type 1 diabetes mellitus with hyperglycemia patient was given IV fluids and improved.  Started on detemir insulin and sliding scale.  We will check another BMP. Lactic acidosis improved with IV fluids. Hypomagnesemia hypokalemia and hypophosphatemia.  This morning magnesium and K-Phos ordered.  Recheck magnesium and phosphorus tomorrow morning.  Recheck potassium with lab draw today. Nausea vomiting diarrhea.  Patient wanted to advance diet to solid food feeling better.  Stool studies negative discontinue isolation Alcohol abuse.  On alcohol withdrawal protocol Paroxysmal atrial fibrillation on Lopressor for rate control and Eliquis for anticoagulation Hyperlipidemia unspecified on pravastatin GERD on Protonix      Code Status:     Code Status Orders  (From admission, onward)           Start     Ordered   05/27/21 1909  Full code  Continuous        05/27/21 1910           Code Status History     Date Active Date Inactive Code Status Order ID Comments User Context   12/27/2020 0149 12/29/2020 1902 Full Code [id number]  Athena Masse, MD ED   04/01/2018 0151 04/01/2018 2033 Full Code [id number]  Amelia Jo, MD ED   05/12/2016 1207 05/12/2016 1546 Full Code [id number]  Poggi, Marshall Cork, MD Inpatient      Disposition Plan: Status is: Inpatient  Dispo: The patient is from: Home              Anticipated d/c is to: Home              Patient currently presented with nausea vomiting and diarrhea and elevated sugars.  We will advance to solid food today and see how he does.   Difficult to place patient.  No.  Time spent: 27 minutes  [place]

## 2021-05-28 NOTE — Progress Notes (Signed)
Date and time results received: 05/28/21 0120 (use smartphrase ".now" to insert current time)  Test: lactic acid Critical Value: 3.9  Name of Provider Notified: Fuller Plan MD  Orders Received? Or Actions Taken?: Orders Received - See Orders for details  1,000 mL NS IV bolus administered

## 2021-05-29 DIAGNOSIS — E1065 Type 1 diabetes mellitus with hyperglycemia: Secondary | ICD-10-CM | POA: Diagnosis not present

## 2021-05-29 DIAGNOSIS — R197 Diarrhea, unspecified: Secondary | ICD-10-CM

## 2021-05-29 DIAGNOSIS — E440 Moderate protein-calorie malnutrition: Secondary | ICD-10-CM

## 2021-05-29 DIAGNOSIS — E872 Acidosis: Secondary | ICD-10-CM | POA: Diagnosis not present

## 2021-05-29 DIAGNOSIS — R112 Nausea with vomiting, unspecified: Secondary | ICD-10-CM

## 2021-05-29 DIAGNOSIS — E876 Hypokalemia: Secondary | ICD-10-CM | POA: Diagnosis not present

## 2021-05-29 LAB — GLUCOSE, CAPILLARY
Glucose-Capillary: 464 mg/dL — ABNORMAL HIGH (ref 70–99)
Glucose-Capillary: 363 mg/dL — ABNORMAL HIGH (ref 70–99)

## 2021-05-29 LAB — BASIC METABOLIC PANEL
Anion gap: 10 (ref 5–15)
BUN: 7 mg/dL (ref 6–20)
CO2: 33 mmol/L — ABNORMAL HIGH (ref 22–32)
Calcium: 6.7 mg/dL — ABNORMAL LOW (ref 8.9–10.3)
Chloride: 90 mmol/L — ABNORMAL LOW (ref 98–111)
Creatinine, Ser: 0.78 mg/dL (ref 0.61–1.24)
GFR, Estimated: >60 mL/min (ref >60)
Glucose, Bld: 431 mg/dL — ABNORMAL HIGH (ref 70–99)
Potassium: 4.1 mmol/L (ref 3.5–5.1)
Sodium: 133 mmol/L — ABNORMAL LOW (ref 135–145)

## 2021-05-29 LAB — MAGNESIUM: Magnesium: 1.6 mg/dL — ABNORMAL LOW (ref 1.7–2.4)

## 2021-05-29 LAB — PHOSPHORUS: Phosphorus: 3.1 mg/dL (ref 2.5–4.6)

## 2021-05-29 LAB — HEMOGLOBIN: Hemoglobin: 11.6 g/dL — ABNORMAL LOW (ref 13.0–17.0)

## 2021-05-29 MED ORDER — ADULT MULTIVITAMIN W/MINERALS CH: 1.0000 | ORAL_TABLET | Freq: Every day | ORAL | Status: DC

## 2021-05-29 MED ORDER — INSULIN ASPART 100 UNIT/ML IJ SOLN: 0.0000 [IU] | Freq: Three times a day (TID) | INTRAMUSCULAR | Status: DC

## 2021-05-29 MED ORDER — MAGNESIUM SULFATE 2 GM/50ML IV SOLN
2.0000 g | Freq: Once | INTRAVENOUS | Status: AC
  Administered 2021-05-29: 2 g via INTRAVENOUS
  Filled 2021-05-29: qty 50

## 2021-05-29 MED ORDER — THIAMINE HCL 100 MG PO TABS: 100.0000 mg | ORAL_TABLET | Freq: Every day | ORAL | 0 refills | Status: DC

## 2021-05-29 MED ORDER — MAGNESIUM OXIDE -MG SUPPLEMENT 400 (240 MG) MG PO TABS: 400.0000 mg | ORAL_TABLET | Freq: Every day | ORAL | 0 refills | Status: DC

## 2021-05-29 MED ORDER — INSULIN PEN NEEDLE 34G X 3.5 MM MISC: 1.0000 | Freq: Every day | 0 refills | Status: DC

## 2021-05-29 MED ORDER — GLUCERNA SHAKE PO LIQD: 237.0000 mL | Freq: Three times a day (TID) | ORAL | 0 refills | Status: DC

## 2021-05-29 MED ORDER — INSULIN ASPART 100 UNIT/ML IJ SOLN: 5.0000 [IU] | Freq: Three times a day (TID) | INTRAMUSCULAR | Status: DC

## 2021-05-29 MED ORDER — INSULIN DETEMIR 100 UNIT/ML ~~LOC~~ SOLN
15.0000 [IU] | Freq: Every day | SUBCUTANEOUS | Status: DC
  Administered 2021-05-29: 15 [IU] via SUBCUTANEOUS
  Filled 2021-05-29: qty 0.15

## 2021-05-29 MED ORDER — INSULIN ASPART 100 UNIT/ML IJ SOLN: 0.0000 [IU] | Freq: Every day | INTRAMUSCULAR | Status: DC

## 2021-05-29 MED ORDER — INSULIN ASPART 100 UNIT/ML IJ SOLN
10.0000 [IU] | Freq: Once | INTRAMUSCULAR | Status: AC
  Administered 2021-05-29: 10 [IU] via SUBCUTANEOUS
  Filled 2021-05-29: qty 1

## 2021-05-29 MED ORDER — INSULIN DETEMIR 100 UNIT/ML FLEXPEN: 15.0000 [IU] | PEN_INJECTOR | Freq: Every day | SUBCUTANEOUS | 0 refills | Status: DC

## 2021-05-29 MED ORDER — TRAZODONE HCL 50 MG PO TABS: 50.0000 mg | ORAL_TABLET | Freq: Every day | ORAL | 0 refills | Status: DC

## 2021-05-29 MED ORDER — OSCAL 500/200 D-3 500-200 MG-UNIT PO TABS: 1.0000 | ORAL_TABLET | Freq: Two times a day (BID) | ORAL | 0 refills | Status: DC

## 2021-05-29 NOTE — Progress Notes (Addendum)
Patients blood sugar 464 this morning,Duncan MD notified and orders for 10 units novolog Brown were ordered and administered.

## 2021-05-29 NOTE — Progress Notes (Signed)
Inpatient Diabetes Program Recommendations  AACE/ADA: New Consensus Statement on Inpatient Glycemic Control   Target Ranges:  Prepandial:   less than 140 mg/dL      Peak postprandial:   less than 180 mg/dL (1-2 hours)      Critically ill patients:  140 - 180 mg/dL   Results for Russell Gomez, Russell Gomez (MRN [id number]) as of 05/29/2021 07:41  Ref. Range 05/28/2021 08:19 05/28/2021 11:51 05/28/2021 17:00 05/28/2021 17:03 05/28/2021 17:43 05/28/2021 20:32 05/28/2021 23:49 05/29/2021 06:04  Glucose-Capillary Latest Ref Range: 70 - 99 mg/dL 429 (H)  Novolog 14 units '@9'$ :13  Levemir 12 units @ 10:14 161 (H)  Novolog 7 units @ 12:40 33 (LL) 37 (LL) 112 (H) 139 (H) 332 (H) 464 (H)  Novolog 10 units @ 7:00  Results for Russell Gomez, Russell Gomez (MRN [id number]) as of 05/29/2021 07:41  Ref. Range 12/27/2020 05:21 05/28/2021 00:23  Hemoglobin A1C Latest Ref Range: 4.8 - 5.6 % 7.4 (H) 8.2 (H)    Review of Glycemic Control  Diabetes history: DM1 (makes NO insulin; requires basal, correction, and carb coverage insulin) Outpatient Diabetes medications: NPH 6-10 units QAM, NPH 7 units QPM, Regular 5-9 units TID with meals Current orders for Inpatient glycemic control: Levemir 10 units QD, Novolog 0-9 units TID with meals, Novolog 0-5 units QHS  Inpatient Diabetes Program Recommendations:    Insulin: Noted Levemir decreased and meal coverage insulin stopped due to hypoglycemia on 05/28/21. Anticipate hypoglycemia due to amount of Novolog he was given. Please consider increasing Levemir to 15 units daily (to start this morning at 10:00 am), decreasing Novolog correction to 0-6 units TID with meals and ordering Novolog 3 units TID with meals for meal coverage if patient eats at least 50% of meals.  NOTE: Per chart, noted patient has Type 1 DM and sees Dr. Honor Junes (Endocrinologist) and was last seen 01/20/21. Per office note on 01/20/21, patient has gotten insurance and wanted to go back on prior insulin regimen. Per note, "He was  noted to be taking 8 units of N bid and ~5 units of R plus sliding scale. His ratio is 15 and his sensitivity is 50 with a target of 100. I will therefore switch him to [employer] and Novolog w/ meals. I sent in prescriptions. I told him to start on 16 units of basaglar daily. If his numbers are running high overall, I told him to increase to 18 or 20 units as appropriate. I told him to calculate his Novolog doses the same."     Spoke with patient over the phone to inquire about outpatient insulin regimen. Patient states that he is currently using Novolin NPH and Novolin Regular. Patient states he takes NPH 6-10 units QAM (dose depends on how glucose is trending), NPH 7 units QPM, Regular 1-9 units TID with meals (for correction and carb coverage). Patient states that he has continued using NPH and Regular because it is cheaper. Discussed using savings cards for more expensive insulins in case he decided to change insulin regimen in the future. Discussed current A1C ov 8.2% on 05/28/21 indicating an average glucose of 189 mg/dl over the past 2-3 months. Patient states that he checks glucose with finger sticks using Contour glucometer. Inquired about whether he ate or drank anything with carbs during the night. Patient states that he ate a [place] sandwich during the night and that he was told that he would not be given any insulin for carb coverage since his glucose was low yesterday afternoon. Patient states he  knew the sandwich was going to make his glucose go up since he did not get any insulin for the carbs. Discussed glucose of 464 mg/dl this morning and that he was given Novolog 10 units at 7:00 am. Anticipate the Novolog 10 units will be more than enough to get glucose down and cover any carbs he is going to eat this morning (given 1 unit covers 15 grams of carbs and 1 unit drops his glucose 50 mg/dl). Patient states he ordered an omelet, grits, and a biscuit for breakfast this morning. Encouraged patient to  eat at least 15-30 grams of carbs this morning to prevent hypoglycemia following the amount of Novolog he has already been given this morning. Patient verbalized understanding of information and states he has no questions at this time.   Thanks, Barnie Alderman, RN, MSN, CDE Diabetes Coordinator Inpatient Diabetes Program [phone] (Team Pager from 8am to 5pm)

## 2021-05-29 NOTE — Discharge Summary (Signed)
[facility] NAME: Russell Gomez    MR#:  [id number]  DATE OF BIRTH:  1961-02-01  DATE OF ADMISSION:  05/27/2021 ADMITTING PHYSICIAN: Leslee Home, DO  DATE OF DISCHARGE: 05/29/2021 11:08 AM  PRIMARY CARE PHYSICIAN: Ria Bush, MD    ADMISSION DIAGNOSIS:  Hypokalemia [E87.6] Hypomagnesemia [E83.42] DKA, type 1 ([place]) [E10.10] Nausea vomiting and diarrhea [R11.2, R19.7] Hyperglycemia due to diabetes mellitus ([place]) [E11.65]  DISCHARGE DIAGNOSIS:  Active Problems:   DKA, type 1 ([place])   Type 1 diabetes mellitus with hyperglycemia (HCC)   Lactic acid acidosis   AF (paroxysmal atrial fibrillation) (HCC)   Hyperlipidemia   Malnutrition of moderate degree   SECONDARY DIAGNOSIS:   Past Medical History:  Diagnosis Date  . Arthritis    on operative finger  . Dental crowns present   . Diabetes type 1, uncontrolled (HCC)    IDDM (Dr. Eddie Dibbles @ [facility])  . Esophageal reflux   . Goiter 04/2012   no current med, has yearly monitoring  . High cholesterol   . HTN (hypertension)    under control with meds, has been on med x 2 yr.    HOSPITAL COURSE:   Type 1 diabetes mellitus with hyperglycemia.  The patient was placed on IV fluid hydration and improved.  Patient was started on detemir insulin and sliding scale.  The patient did have hypoglycemia yesterday and with cutting back insulin had hyperglycemia this morning.  Patient feeling well and wanted to go home.  The patient was okay with me prescribing the detemir insulin for him to go home with.  If it is too expensive he will go back to his Novolin. Lactic acidosis improved with IV fluids. Hypomagnesemia, hypokalemia and hypophosphatemia on presentation.  I did give K-Phos during the hospitalization.  I did give IV magnesium both days.  Will prescribe oral magnesium upon going home. Nausea vomiting and diarrhea.  This has improved.  Tolerating solid food.  Stool studies were  negative. Alcohol abuse.  Advised to stop drinking alcohol. Paroxysmal atrial fibrillation on Lopressor for rate control and Eliquis for anticoagulation Hyperlipidemia unspecified on pravastatin GERD on Protonix Malnutrition of moderate degree  DISCHARGE CONDITIONS:   Satisfactory  CONSULTS OBTAINED:   None  DRUG ALLERGIES:   Allergies  Allergen Reactions  . Insulin Aspart Other (See Comments)    CELLULITIS  . Spironolactone Nausea Only and Rash  . Hydrochlorothiazide Itching and Rash  . Morphine Other (See Comments)    "MAKES ME MEAN"    DISCHARGE MEDICATIONS:   Allergies as of 05/29/2021       Reactions   Insulin Aspart Other (See Comments)   CELLULITIS   Spironolactone Nausea Only, Rash   Hydrochlorothiazide Itching, Rash   Morphine Other (See Comments)   "MAKES ME MEAN"        Medication List     STOP taking these medications    insulin NPH Human 100 UNIT/ML injection Commonly known as: NOVOLIN N   Magnesium 400 MG Tabs Replaced by: magnesium oxide 400 (240 Mg) MG tablet   oxazepam 10 MG capsule Commonly known as: SERAX   Vitamin D3 25 MCG (1000 UT) Caps       TAKE these medications    apixaban 5 MG Tabs tablet Commonly known as: ELIQUIS Take 1 tablet (5 mg total) by mouth 2 (two) times daily.   feeding supplement (GLUCERNA SHAKE) Liqd Take 237 mLs by mouth 3 (three) times daily between  meals.   folic acid 1 MG tablet Commonly known as: FOLVITE Take 1 tablet by mouth once daily   furosemide 40 MG tablet Commonly known as: LASIX Take 1 tablet (40 mg total) by mouth daily.   glucose blood test strip Use 6 (six) times daily   hydrOXYzine 25 MG tablet Commonly known as: ATARAX/VISTARIL Take 0.5-1 tablets (12.5-25 mg total) by mouth 2 (two) times daily as needed for anxiety or nausea (sedation precautions).   insulin detemir 100 UNIT/ML FlexPen Commonly known as: LEVEMIR Inject 15 Units into the skin daily.   Insulin Pen Needle 34G  X 3.5 MM Misc 1 Dose by Does not apply route daily.   insulin regular 100 units/mL injection Commonly known as: NOVOLIN R Inject 5-9 Units into the skin as directed. Sliding scale as needed.   magnesium oxide 400 (240 Mg) MG tablet Commonly known as: MAG-OX Take 1 tablet (400 mg total) by mouth daily. Replaces: Magnesium 400 MG Tabs   metoprolol tartrate 100 MG tablet Commonly known as: LOPRESSOR Take 1 tablet (100 mg total) by mouth 2 (two) times daily.   multivitamin with minerals Tabs tablet Take 1 tablet by mouth daily.   Oscal 500/200 D-3 500-200 MG-UNIT tablet Generic drug: calcium-vitamin D Take 1 tablet by mouth 2 (two) times daily.   pantoprazole 40 MG tablet Commonly known as: PROTONIX Take 1 tablet (40 mg total) by mouth 2 (two) times daily before a meal.   potassium chloride SA 20 MEQ tablet Commonly known as: Klor-Con M20 Take 1 tablet (20 mEq total) by mouth daily.   pravastatin 80 MG tablet Commonly known as: PRAVACHOL Take 1 tablet by mouth once daily   thiamine 100 MG tablet Take 1 tablet (100 mg total) by mouth daily.   traZODone 50 MG tablet Commonly known as: DESYREL Take 1 tablet (50 mg total) by mouth at bedtime.         DISCHARGE INSTRUCTIONS:   Follow-up PMD 5 days  If you experience worsening of your admission symptoms, develop shortness of breath, life threatening emergency, suicidal or homicidal thoughts you must seek medical attention immediately by calling 911 or calling your MD immediately  if symptoms less severe.  You Must read complete instructions/literature along with all the possible adverse reactions/side effects for all the Medicines you take and that have been prescribed to you. Take any new Medicines after you have completely understood and accept all the possible adverse reactions/side effects.   Please note  You were cared for by a hospitalist during your hospital stay. If you have any questions about your discharge  medications or the care you received while you were in the hospital after you are discharged, you can call the unit and asked to speak with the hospitalist on call if the hospitalist that took care of you is not available. Once you are discharged, your primary care physician will handle any further medical issues. Please note that NO REFILLS for any discharge medications will be authorized once you are discharged, as it is imperative that you return to your primary care physician (or establish a relationship with a primary care physician if you do not have one) for your aftercare needs so that they can reassess your need for medications and monitor your lab values.    Today   CHIEF COMPLAINT:   Chief Complaint  Patient presents with  . Hyperglycemia    HISTORY OF PRESENT ILLNESS:  Russell Gomez  is a 60 y.o. male came in  with hyperglycemia   VITAL SIGNS:  Blood pressure (!) 124/93, pulse 90, temperature 98.2 F (36.8 C), resp. rate 18, height '6\' 3"'$  (1.905 m), weight 81.6 kg, SpO2 98 %.  I/O:   Intake/Output Summary (Last 24 hours) at 05/29/2021 1711 Last data filed at 05/29/2021 1018 Gross per 24 hour  Intake 248.56 ml  Output 652 ml  Net -403.44 ml    PHYSICAL EXAMINATION:  GENERAL:  60 y.o.-year-old patient lying in the bed with no acute distress.  EYES: Pupils equal, round, reactive to light and accommodation. No scleral icterus. Extraocular muscles intact.  HEENT: Head atraumatic, normocephalic. Oropharynx and nasopharynx clear.  LUNGS: Normal breath sounds bilaterally, no wheezing, rales,rhonchi or crepitation. No use of accessory muscles of respiration.  CARDIOVASCULAR: S1, S2 normal. No murmurs, rubs, or gallops.  ABDOMEN: Soft, non-tender, non-distended.   EXTREMITIES: No pedal edema.  NEUROLOGIC: Cranial nerves II through XII are intact. Muscle strength 5/5 in all extremities. Sensation intact. Gait not checked.  PSYCHIATRIC: The patient is alert and oriented x 3.   SKIN: No obvious rash, lesion, or ulcer.   DATA REVIEW:   CBC Recent Labs  Lab 05/28/21 0023 05/29/21 0554  WBC 9.0  --   HGB 14.0 11.6*  HCT 39.6  --   PLT 198  --     Chemistries  Recent Labs  Lab 05/29/21 0554  NA 133*  K 4.1  CL 90*  CO2 33*  GLUCOSE 431*  BUN 7  CREATININE 0.78  CALCIUM 6.7*  MG 1.6*     Microbiology Results  Results for orders placed or performed during the hospital encounter of 05/27/21  Resp Panel by RT-PCR (Flu A&B, Covid) Nasopharyngeal Swab     Status: None   Collection Time: 05/27/21  3:36 PM   Specimen: Nasopharyngeal Swab; Nasopharyngeal(NP) swabs in vial transport medium  Result Value Ref Range Status   SARS Coronavirus 2 by RT PCR NEGATIVE NEGATIVE Final    Comment: (NOTE) SARS-CoV-2 target nucleic acids are NOT DETECTED.  The SARS-CoV-2 RNA is generally detectable in upper respiratory specimens during the acute phase of infection. The lowest concentration of SARS-CoV-2 viral copies this assay can detect is 138 copies/mL. A negative result does not preclude SARS-Cov-2 infection and should not be used as the sole basis for treatment or other patient management decisions. A negative result may occur with  improper specimen collection/handling, submission of specimen other than nasopharyngeal swab, presence of viral mutation(s) within the areas targeted by this assay, and inadequate number of viral copies(<138 copies/mL). A negative result must be combined with clinical observations, patient history, and epidemiological information. The expected result is Negative.  Fact Sheet for Patients:  EntrepreneurPulse.com.au  Fact Sheet for Healthcare Providers:  IncredibleEmployment.be  This test is no t yet approved or cleared by the [place] FDA and  has been authorized for detection and/or diagnosis of SARS-CoV-2 by FDA under an Emergency Use Authorization (EUA). This EUA will remain  in  effect (meaning this test can be used) for the duration of the COVID-19 declaration under Section 564(b)(1) of the Act, 21 U.S.C.section 360bbb-3(b)(1), unless the authorization is terminated  or revoked sooner.       Influenza A by PCR NEGATIVE NEGATIVE Final   Influenza B by PCR NEGATIVE NEGATIVE Final    Comment: (NOTE) The Xpert Xpress SARS-CoV-2/FLU/RSV plus assay is intended as an aid in the diagnosis of influenza from Nasopharyngeal swab specimens and should not be used as a sole basis for  treatment. Nasal washings and aspirates are unacceptable for Xpert Xpress SARS-CoV-2/FLU/RSV testing.  Fact Sheet for Patients: EntrepreneurPulse.com.au  Fact Sheet for Healthcare Providers: IncredibleEmployment.be  This test is not yet approved or cleared by the [place] FDA and has been authorized for detection and/or diagnosis of SARS-CoV-2 by FDA under an Emergency Use Authorization (EUA). This EUA will remain in effect (meaning this test can be used) for the duration of the COVID-19 declaration under Section 564(b)(1) of the Act, 21 U.S.C. section 360bbb-3(b)(1), unless the authorization is terminated or revoked.  Performed at [facility], [address] [place], [place] [address]   Gastrointestinal Panel by PCR , Stool     Status: None   Collection Time: 05/27/21  5:54 PM   Specimen: Stool  Result Value Ref Range Status   Campylobacter species NOT DETECTED NOT DETECTED Final   Plesimonas shigelloides NOT DETECTED NOT DETECTED Final   Salmonella species NOT DETECTED NOT DETECTED Final   Yersinia enterocolitica NOT DETECTED NOT DETECTED Final   Vibrio species NOT DETECTED NOT DETECTED Final   Vibrio cholerae NOT DETECTED NOT DETECTED Final   Enteroaggregative E coli (EAEC) NOT DETECTED NOT DETECTED Final   Enteropathogenic E coli (EPEC) NOT DETECTED NOT DETECTED Final   Enterotoxigenic E coli (ETEC) NOT DETECTED NOT DETECTED  Final   Shiga like toxin producing E coli (STEC) NOT DETECTED NOT DETECTED Final   Shigella/Enteroinvasive E coli (EIEC) NOT DETECTED NOT DETECTED Final   Cryptosporidium NOT DETECTED NOT DETECTED Final   Cyclospora cayetanensis NOT DETECTED NOT DETECTED Final   Entamoeba histolytica NOT DETECTED NOT DETECTED Final   Giardia lamblia NOT DETECTED NOT DETECTED Final   Adenovirus F40/41 NOT DETECTED NOT DETECTED Final   Astrovirus NOT DETECTED NOT DETECTED Final   Norovirus GI/GII NOT DETECTED NOT DETECTED Final   Rotavirus A NOT DETECTED NOT DETECTED Final   Sapovirus (I, II, IV, and V) NOT DETECTED NOT DETECTED Final    Comment: Performed at [facility], [address] [place], [place] [address]  C Difficile Quick Screen w PCR reflex     Status: None   Collection Time: 05/27/21  5:54 PM   Specimen: Stool  Result Value Ref Range Status   C Diff antigen NEGATIVE NEGATIVE Final   C Diff toxin NEGATIVE NEGATIVE Final   C Diff interpretation No C. difficile detected.  Final    Comment: Performed at [facility], [address] [place], [place] [address]      Management plans discussed with the patient, and he is in agreement.  CODE STATUS:  Code Status History     Date Active Date Inactive Code Status Order ID Comments User Context   05/27/2021 1911 05/29/2021 1614 Full Code [id number]  Leslee Home, DO ED   12/27/2020 0149 12/29/2020 1902 Full Code [id number]  Athena Masse, MD ED   04/01/2018 0151 04/01/2018 2033 Full Code [id number]  Amelia Jo, MD ED   05/12/2016 1207 05/12/2016 1546 Full Code [id number]  Poggi, Marshall Cork, MD Inpatient      Questions for Most Recent Historical Code Status (Order [id number])        TOTAL TIME TAKING CARE OF THIS PATIENT: 34 minutes.    Loletha Grayer M.D on 05/29/2021 at 5:11 PM  [facility]  CC: Primary care physician; Ria Bush, MD

## 2021-05-29 NOTE — Progress Notes (Signed)
Pt discharged to home.  IV removed without complication.  AVS given to pt and explained with no further questions.  Pt transported off unit via WC

## 2021-06-02 ENCOUNTER — Ambulatory Visit: Payer: BC Managed Care – PPO | Admitting: Family Medicine

## 2021-06-03 ENCOUNTER — Ambulatory Visit: Payer: BC Managed Care – PPO | Admitting: Family Medicine

## 2021-06-03 LAB — GLUCOSE, CAPILLARY: Glucose-Capillary: 33 mg/dL — CL (ref 70–99)

## 2021-06-13 ENCOUNTER — Other Ambulatory Visit: Payer: Self-pay

## 2021-06-13 ENCOUNTER — Encounter: Payer: Self-pay | Admitting: Emergency Medicine

## 2021-06-13 DIAGNOSIS — E1065 Type 1 diabetes mellitus with hyperglycemia: Secondary | ICD-10-CM | POA: Diagnosis present

## 2021-06-13 DIAGNOSIS — J9602 Acute respiratory failure with hypercapnia: Secondary | ICD-10-CM | POA: Diagnosis not present

## 2021-06-13 DIAGNOSIS — K567 Ileus, unspecified: Secondary | ICD-10-CM | POA: Diagnosis not present

## 2021-06-13 DIAGNOSIS — G4700 Insomnia, unspecified: Secondary | ICD-10-CM | POA: Diagnosis present

## 2021-06-13 DIAGNOSIS — A419 Sepsis, unspecified organism: Secondary | ICD-10-CM | POA: Diagnosis not present

## 2021-06-13 DIAGNOSIS — K219 Gastro-esophageal reflux disease without esophagitis: Secondary | ICD-10-CM | POA: Diagnosis present

## 2021-06-13 DIAGNOSIS — N179 Acute kidney failure, unspecified: Secondary | ICD-10-CM | POA: Diagnosis present

## 2021-06-13 DIAGNOSIS — G928 Other toxic encephalopathy: Secondary | ICD-10-CM | POA: Diagnosis present

## 2021-06-13 DIAGNOSIS — E872 Acidosis: Secondary | ICD-10-CM | POA: Diagnosis not present

## 2021-06-13 DIAGNOSIS — K5000 Crohn's disease of small intestine without complications: Secondary | ICD-10-CM | POA: Diagnosis present

## 2021-06-13 DIAGNOSIS — I9589 Other hypotension: Secondary | ICD-10-CM | POA: Diagnosis not present

## 2021-06-13 DIAGNOSIS — E876 Hypokalemia: Secondary | ICD-10-CM | POA: Diagnosis present

## 2021-06-13 DIAGNOSIS — K388 Other specified diseases of appendix: Secondary | ICD-10-CM | POA: Diagnosis not present

## 2021-06-13 DIAGNOSIS — R0902 Hypoxemia: Secondary | ICD-10-CM | POA: Diagnosis not present

## 2021-06-13 DIAGNOSIS — K631 Perforation of intestine (nontraumatic): Secondary | ICD-10-CM | POA: Diagnosis not present

## 2021-06-13 DIAGNOSIS — K5720 Diverticulitis of large intestine with perforation and abscess without bleeding: Secondary | ICD-10-CM | POA: Diagnosis present

## 2021-06-13 DIAGNOSIS — F1020 Alcohol dependence, uncomplicated: Secondary | ICD-10-CM | POA: Diagnosis not present

## 2021-06-13 DIAGNOSIS — J690 Pneumonitis due to inhalation of food and vomit: Secondary | ICD-10-CM | POA: Diagnosis not present

## 2021-06-13 DIAGNOSIS — F10231 Alcohol dependence with withdrawal delirium: Secondary | ICD-10-CM | POA: Diagnosis not present

## 2021-06-13 DIAGNOSIS — Z8249 Family history of ischemic heart disease and other diseases of the circulatory system: Secondary | ICD-10-CM

## 2021-06-13 DIAGNOSIS — E861 Hypovolemia: Secondary | ICD-10-CM | POA: Diagnosis not present

## 2021-06-13 DIAGNOSIS — J9601 Acute respiratory failure with hypoxia: Secondary | ICD-10-CM | POA: Diagnosis not present

## 2021-06-13 DIAGNOSIS — R451 Restlessness and agitation: Secondary | ICD-10-CM | POA: Diagnosis not present

## 2021-06-13 DIAGNOSIS — E10649 Type 1 diabetes mellitus with hypoglycemia without coma: Secondary | ICD-10-CM | POA: Diagnosis not present

## 2021-06-13 DIAGNOSIS — I340 Nonrheumatic mitral (valve) insufficiency: Secondary | ICD-10-CM | POA: Diagnosis present

## 2021-06-13 DIAGNOSIS — I959 Hypotension, unspecified: Secondary | ICD-10-CM | POA: Diagnosis not present

## 2021-06-13 DIAGNOSIS — Z20822 Contact with and (suspected) exposure to covid-19: Secondary | ICD-10-CM | POA: Diagnosis not present

## 2021-06-13 DIAGNOSIS — D125 Benign neoplasm of sigmoid colon: Secondary | ICD-10-CM | POA: Diagnosis not present

## 2021-06-13 DIAGNOSIS — R6520 Severe sepsis without septic shock: Secondary | ICD-10-CM | POA: Diagnosis not present

## 2021-06-13 DIAGNOSIS — E7800 Pure hypercholesterolemia, unspecified: Secondary | ICD-10-CM | POA: Diagnosis present

## 2021-06-13 DIAGNOSIS — R1084 Generalized abdominal pain: Secondary | ICD-10-CM | POA: Diagnosis not present

## 2021-06-13 DIAGNOSIS — Z716 Tobacco abuse counseling: Secondary | ICD-10-CM

## 2021-06-13 DIAGNOSIS — R000 Tachycardia, unspecified: Secondary | ICD-10-CM | POA: Diagnosis not present

## 2021-06-13 DIAGNOSIS — E8809 Other disorders of plasma-protein metabolism, not elsewhere classified: Secondary | ICD-10-CM | POA: Diagnosis present

## 2021-06-13 DIAGNOSIS — Z885 Allergy status to narcotic agent status: Secondary | ICD-10-CM

## 2021-06-13 DIAGNOSIS — R109 Unspecified abdominal pain: Secondary | ICD-10-CM | POA: Diagnosis not present

## 2021-06-13 DIAGNOSIS — I480 Paroxysmal atrial fibrillation: Secondary | ICD-10-CM | POA: Diagnosis present

## 2021-06-13 DIAGNOSIS — Z888 Allergy status to other drugs, medicaments and biological substances status: Secondary | ICD-10-CM

## 2021-06-13 DIAGNOSIS — R739 Hyperglycemia, unspecified: Secondary | ICD-10-CM | POA: Diagnosis not present

## 2021-06-13 DIAGNOSIS — F17210 Nicotine dependence, cigarettes, uncomplicated: Secondary | ICD-10-CM | POA: Diagnosis present

## 2021-06-13 DIAGNOSIS — D62 Acute posthemorrhagic anemia: Secondary | ICD-10-CM | POA: Diagnosis not present

## 2021-06-13 DIAGNOSIS — K650 Generalized (acute) peritonitis: Secondary | ICD-10-CM | POA: Diagnosis present

## 2021-06-13 DIAGNOSIS — R1110 Vomiting, unspecified: Secondary | ICD-10-CM | POA: Diagnosis not present

## 2021-06-13 DIAGNOSIS — R918 Other nonspecific abnormal finding of lung field: Secondary | ICD-10-CM | POA: Diagnosis not present

## 2021-06-13 DIAGNOSIS — I10 Essential (primary) hypertension: Secondary | ICD-10-CM | POA: Diagnosis present

## 2021-06-13 LAB — BLOOD GAS, VENOUS
Acid-Base Excess: 4.3 mmol/L — ABNORMAL HIGH (ref 0.0–2.0)
Bicarbonate: 27.3 mmol/L (ref 20.0–28.0)
O2 Saturation: 85.1 %
Patient temperature: 37
pCO2, Ven: 35 mmHg — ABNORMAL LOW (ref 44.0–60.0)
pH, Ven: 7.5 — ABNORMAL HIGH (ref 7.250–7.430)
pO2, Ven: 45 mmHg (ref 32.0–45.0)

## 2021-06-13 LAB — COMPREHENSIVE METABOLIC PANEL
ALT: 32 U/L (ref 0–44)
AST: 58 U/L — ABNORMAL HIGH (ref 15–41)
Albumin: 2.4 g/dL — ABNORMAL LOW (ref 3.5–5.0)
Alkaline Phosphatase: 200 U/L — ABNORMAL HIGH (ref 38–126)
Anion gap: 15 (ref 5–15)
BUN: 5 mg/dL — ABNORMAL LOW (ref 6–20)
CO2: 22 mmol/L (ref 22–32)
Calcium: 6.4 mg/dL — CL (ref 8.9–10.3)
Chloride: 97 mmol/L — ABNORMAL LOW (ref 98–111)
Creatinine, Ser: 0.8 mg/dL (ref 0.61–1.24)
GFR, Estimated: >60 mL/min (ref >60)
Glucose, Bld: 443 mg/dL — ABNORMAL HIGH (ref 70–99)
Potassium: 3.5 mmol/L (ref 3.5–5.1)
Sodium: 134 mmol/L — ABNORMAL LOW (ref 135–145)
Total Bilirubin: 2 mg/dL — ABNORMAL HIGH (ref 0.3–1.2)
Total Protein: 5.8 g/dL — ABNORMAL LOW (ref 6.5–8.1)

## 2021-06-13 LAB — CBC
HCT: 41.7 % (ref 39.0–52.0)
Hemoglobin: 14.5 g/dL (ref 13.0–17.0)
MCH: 34 pg (ref 26.0–34.0)
MCHC: 34.8 g/dL (ref 30.0–36.0)
MCV: 97.9 fL (ref 80.0–100.0)
Platelets: 369 10*3/uL (ref 150–400)
RBC: 4.26 MIL/uL (ref 4.22–5.81)
RDW: 14.8 % (ref 11.5–15.5)
WBC: 11.6 10*3/uL — ABNORMAL HIGH (ref 4.0–10.5)
nRBC: 0 % (ref 0.0–0.2)

## 2021-06-13 LAB — LIPASE, BLOOD: Lipase: 18 U/L (ref 11–51)

## 2021-06-13 LAB — CBG MONITORING, ED: Glucose-Capillary: 479 mg/dL — ABNORMAL HIGH (ref 70–99)

## 2021-06-13 MED ORDER — SODIUM CHLORIDE 0.9 % IV SOLN: INTRAVENOUS | Status: DC

## 2021-06-13 MED ORDER — SODIUM CHLORIDE 0.9 % IV BOLUS
1000.0000 mL | Freq: Once | INTRAVENOUS | Status: AC
  Administered 2021-06-13: 1000 mL via INTRAVENOUS

## 2021-06-13 MED ORDER — ONDANSETRON HCL 4 MG/2ML IJ SOLN
4.0000 mg | Freq: Once | INTRAMUSCULAR | Status: AC
  Administered 2021-06-13: 4 mg via INTRAVENOUS
  Filled 2021-06-13: qty 2

## 2021-06-13 NOTE — ED Triage Notes (Signed)
Pt to ED via [facility] with complaints of abd pain N/V for the last two days he has tried several OTC that have not worked. He reports that it is two inches below his naval and goes across his abd

## 2021-06-14 ENCOUNTER — Inpatient Hospital Stay
Admission: EM | Admit: 2021-06-14 | Discharge: 2021-06-25 | DRG: 853 | Disposition: A | Discharge status: Rehabilitation Facility | Payer: BC Managed Care – PPO | Attending: Obstetrics and Gynecology | Admitting: Obstetrics and Gynecology

## 2021-06-14 ENCOUNTER — Inpatient Hospital Stay: Payer: BC Managed Care – PPO | Admitting: Anesthesiology

## 2021-06-14 ENCOUNTER — Emergency Department: Payer: BC Managed Care – PPO

## 2021-06-14 ENCOUNTER — Encounter
Admission: EM | Disposition: A | Discharge status: Rehabilitation Facility | Payer: Self-pay | Source: Home / Self Care | Attending: Internal Medicine

## 2021-06-14 DIAGNOSIS — F1020 Alcohol dependence, uncomplicated: Secondary | ICD-10-CM | POA: Diagnosis not present

## 2021-06-14 DIAGNOSIS — I959 Hypotension, unspecified: Secondary | ICD-10-CM

## 2021-06-14 DIAGNOSIS — A419 Sepsis, unspecified organism: Secondary | ICD-10-CM | POA: Diagnosis present

## 2021-06-14 DIAGNOSIS — K650 Generalized (acute) peritonitis: Secondary | ICD-10-CM | POA: Diagnosis present

## 2021-06-14 DIAGNOSIS — R1084 Generalized abdominal pain: Secondary | ICD-10-CM

## 2021-06-14 DIAGNOSIS — I480 Paroxysmal atrial fibrillation: Secondary | ICD-10-CM | POA: Diagnosis present

## 2021-06-14 DIAGNOSIS — K631 Perforation of intestine (nontraumatic): Secondary | ICD-10-CM | POA: Diagnosis not present

## 2021-06-14 DIAGNOSIS — E1065 Type 1 diabetes mellitus with hyperglycemia: Secondary | ICD-10-CM | POA: Diagnosis present

## 2021-06-14 DIAGNOSIS — R112 Nausea with vomiting, unspecified: Secondary | ICD-10-CM

## 2021-06-14 DIAGNOSIS — R5381 Other malaise: Secondary | ICD-10-CM | POA: Diagnosis not present

## 2021-06-14 DIAGNOSIS — I10 Essential (primary) hypertension: Secondary | ICD-10-CM | POA: Diagnosis present

## 2021-06-14 DIAGNOSIS — T17908A Unspecified foreign body in respiratory tract, part unspecified causing other injury, initial encounter: Secondary | ICD-10-CM

## 2021-06-14 DIAGNOSIS — K5000 Crohn's disease of small intestine without complications: Secondary | ICD-10-CM

## 2021-06-14 DIAGNOSIS — R7989 Other specified abnormal findings of blood chemistry: Secondary | ICD-10-CM

## 2021-06-14 DIAGNOSIS — E872 Acidosis: Secondary | ICD-10-CM | POA: Diagnosis present

## 2021-06-14 DIAGNOSIS — J690 Pneumonitis due to inhalation of food and vomit: Secondary | ICD-10-CM | POA: Diagnosis not present

## 2021-06-14 DIAGNOSIS — F1010 Alcohol abuse, uncomplicated: Secondary | ICD-10-CM

## 2021-06-14 DIAGNOSIS — K567 Ileus, unspecified: Secondary | ICD-10-CM | POA: Diagnosis not present

## 2021-06-14 DIAGNOSIS — D62 Acute posthemorrhagic anemia: Secondary | ICD-10-CM | POA: Diagnosis not present

## 2021-06-14 DIAGNOSIS — I9589 Other hypotension: Secondary | ICD-10-CM | POA: Diagnosis not present

## 2021-06-14 DIAGNOSIS — E861 Hypovolemia: Secondary | ICD-10-CM | POA: Diagnosis not present

## 2021-06-14 DIAGNOSIS — R6520 Severe sepsis without septic shock: Secondary | ICD-10-CM | POA: Diagnosis present

## 2021-06-14 DIAGNOSIS — G928 Other toxic encephalopathy: Secondary | ICD-10-CM | POA: Diagnosis present

## 2021-06-14 DIAGNOSIS — Z0189 Encounter for other specified special examinations: Secondary | ICD-10-CM

## 2021-06-14 DIAGNOSIS — Z20822 Contact with and (suspected) exposure to covid-19: Secondary | ICD-10-CM | POA: Diagnosis not present

## 2021-06-14 DIAGNOSIS — Z9049 Acquired absence of other specified parts of digestive tract: Secondary | ICD-10-CM | POA: Diagnosis present

## 2021-06-14 DIAGNOSIS — I340 Nonrheumatic mitral (valve) insufficiency: Secondary | ICD-10-CM | POA: Diagnosis present

## 2021-06-14 DIAGNOSIS — E10649 Type 1 diabetes mellitus with hypoglycemia without coma: Secondary | ICD-10-CM | POA: Diagnosis not present

## 2021-06-14 DIAGNOSIS — J9601 Acute respiratory failure with hypoxia: Secondary | ICD-10-CM | POA: Diagnosis not present

## 2021-06-14 DIAGNOSIS — F10231 Alcohol dependence with withdrawal delirium: Secondary | ICD-10-CM | POA: Diagnosis not present

## 2021-06-14 DIAGNOSIS — N179 Acute kidney failure, unspecified: Secondary | ICD-10-CM | POA: Diagnosis present

## 2021-06-14 DIAGNOSIS — K5720 Diverticulitis of large intestine with perforation and abscess without bleeding: Secondary | ICD-10-CM | POA: Diagnosis present

## 2021-06-14 DIAGNOSIS — F17210 Nicotine dependence, cigarettes, uncomplicated: Secondary | ICD-10-CM | POA: Diagnosis present

## 2021-06-14 DIAGNOSIS — T17910A Gastric contents in respiratory tract, part unspecified causing asphyxiation, initial encounter: Secondary | ICD-10-CM

## 2021-06-14 DIAGNOSIS — J9602 Acute respiratory failure with hypercapnia: Secondary | ICD-10-CM | POA: Diagnosis not present

## 2021-06-14 HISTORY — PX: LAPAROTOMY: SHX154

## 2021-06-14 HISTORY — PX: COLON RESECTION SIGMOID: SHX6737

## 2021-06-14 HISTORY — PX: APPENDECTOMY: SHX54

## 2021-06-14 LAB — BASIC METABOLIC PANEL
Anion gap: 16 — ABNORMAL HIGH (ref 5–15)
Anion gap: 5 (ref 5–15)
BUN: 10 mg/dL (ref 6–20)
BUN: 15 mg/dL (ref 6–20)
CO2: 24 mmol/L (ref 22–32)
CO2: 31 mmol/L (ref 22–32)
Calcium: 6.4 mg/dL — CL (ref 8.9–10.3)
Calcium: 7.1 mg/dL — ABNORMAL LOW (ref 8.9–10.3)
Chloride: 93 mmol/L — ABNORMAL LOW (ref 98–111)
Chloride: 102 mmol/L (ref 98–111)
Creatinine, Ser: 0.98 mg/dL (ref 0.61–1.24)
Creatinine, Ser: 1.06 mg/dL (ref 0.61–1.24)
GFR, Estimated: >60 mL/min (ref >60)
GFR, Estimated: >60 mL/min (ref >60)
Glucose, Bld: 510 mg/dL (ref 70–99)
Glucose, Bld: 142 mg/dL — ABNORMAL HIGH (ref 70–99)
Potassium: 3.8 mmol/L (ref 3.5–5.1)
Potassium: 3.3 mmol/L — ABNORMAL LOW (ref 3.5–5.1)
Sodium: 133 mmol/L — ABNORMAL LOW (ref 135–145)
Sodium: 138 mmol/L (ref 135–145)

## 2021-06-14 LAB — COMPREHENSIVE METABOLIC PANEL
ALT: 23 U/L (ref 0–44)
AST: 54 U/L — ABNORMAL HIGH (ref 15–41)
Albumin: 1.8 g/dL — ABNORMAL LOW (ref 3.5–5.0)
Alkaline Phosphatase: 153 U/L — ABNORMAL HIGH (ref 38–126)
Anion gap: 17 — ABNORMAL HIGH (ref 5–15)
BUN: 12 mg/dL (ref 6–20)
CO2: 20 mmol/L — ABNORMAL LOW (ref 22–32)
Calcium: 7.7 mg/dL — ABNORMAL LOW (ref 8.9–10.3)
Chloride: 98 mmol/L (ref 98–111)
Creatinine, Ser: 1.22 mg/dL (ref 0.61–1.24)
GFR, Estimated: >60 mL/min (ref >60)
Glucose, Bld: 474 mg/dL — ABNORMAL HIGH (ref 70–99)
Potassium: 3.2 mmol/L — ABNORMAL LOW (ref 3.5–5.1)
Sodium: 135 mmol/L (ref 135–145)
Total Bilirubin: 1.9 mg/dL — ABNORMAL HIGH (ref 0.3–1.2)
Total Protein: 4.8 g/dL — ABNORMAL LOW (ref 6.5–8.1)

## 2021-06-14 LAB — URINALYSIS, COMPLETE (UACMP) WITH MICROSCOPIC
Bacteria, UA: NONE SEEN
Bilirubin Urine: NEGATIVE
Glucose, UA: >=500 mg/dL — AB
Hgb urine dipstick: NEGATIVE
Ketones, ur: 5 mg/dL — AB
Leukocytes,Ua: NEGATIVE
Nitrite: NEGATIVE
Protein, ur: NEGATIVE mg/dL
Specific Gravity, Urine: 1.03 (ref 1.005–1.030)
pH: 5 (ref 5.0–8.0)

## 2021-06-14 LAB — GLUCOSE, CAPILLARY
Glucose-Capillary: 356 mg/dL — ABNORMAL HIGH (ref 70–99)
Glucose-Capillary: 327 mg/dL — ABNORMAL HIGH (ref 70–99)
Glucose-Capillary: 254 mg/dL — ABNORMAL HIGH (ref 70–99)
Glucose-Capillary: 136 mg/dL — ABNORMAL HIGH (ref 70–99)
Glucose-Capillary: 112 mg/dL — ABNORMAL HIGH (ref 70–99)
Glucose-Capillary: 143 mg/dL — ABNORMAL HIGH (ref 70–99)
Glucose-Capillary: 147 mg/dL — ABNORMAL HIGH (ref 70–99)
Glucose-Capillary: 148 mg/dL — ABNORMAL HIGH (ref 70–99)
Glucose-Capillary: 138 mg/dL — ABNORMAL HIGH (ref 70–99)
Glucose-Capillary: 111 mg/dL — ABNORMAL HIGH (ref 70–99)

## 2021-06-14 LAB — CBC
HCT: 31.7 % — ABNORMAL LOW (ref 39.0–52.0)
HCT: 35 % — ABNORMAL LOW (ref 39.0–52.0)
Hemoglobin: 11.1 g/dL — ABNORMAL LOW (ref 13.0–17.0)
Hemoglobin: 11.9 g/dL — ABNORMAL LOW (ref 13.0–17.0)
MCH: 34.8 pg — ABNORMAL HIGH (ref 26.0–34.0)
MCH: 34.1 pg — ABNORMAL HIGH (ref 26.0–34.0)
MCHC: 35 g/dL (ref 30.0–36.0)
MCHC: 34 g/dL (ref 30.0–36.0)
MCV: 99.4 fL (ref 80.0–100.0)
MCV: 100.3 fL — ABNORMAL HIGH (ref 80.0–100.0)
Platelets: 251 10*3/uL (ref 150–400)
Platelets: 292 10*3/uL (ref 150–400)
RBC: 3.19 MIL/uL — ABNORMAL LOW (ref 4.22–5.81)
RBC: 3.49 MIL/uL — ABNORMAL LOW (ref 4.22–5.81)
RDW: 14.8 % (ref 11.5–15.5)
RDW: 15.1 % (ref 11.5–15.5)
WBC: 11.5 10*3/uL — ABNORMAL HIGH (ref 4.0–10.5)
WBC: 12.6 10*3/uL — ABNORMAL HIGH (ref 4.0–10.5)
nRBC: 0 % (ref 0.0–0.2)
nRBC: 0.2 % (ref 0.0–0.2)

## 2021-06-14 LAB — CBG MONITORING, ED
Glucose-Capillary: 433 mg/dL — ABNORMAL HIGH (ref 70–99)
Glucose-Capillary: 471 mg/dL — ABNORMAL HIGH (ref 70–99)

## 2021-06-14 LAB — PROTIME-INR
INR: 1.3 — ABNORMAL HIGH (ref 0.8–1.2)
INR: 1.2 (ref 0.8–1.2)
Prothrombin Time: 16.5 seconds — ABNORMAL HIGH (ref 11.4–15.2)
Prothrombin Time: 14.7 seconds (ref 11.4–15.2)

## 2021-06-14 LAB — RESP PANEL BY RT-PCR (FLU A&B, COVID) ARPGX2
Influenza A by PCR: NEGATIVE
Influenza B by PCR: NEGATIVE
SARS Coronavirus 2 by RT PCR: NEGATIVE

## 2021-06-14 LAB — MRSA NEXT GEN BY PCR, NASAL: MRSA by PCR Next Gen: NOT DETECTED

## 2021-06-14 LAB — ABO/RH: ABO/RH(D): O POS

## 2021-06-14 LAB — MAGNESIUM
Magnesium: 1 mg/dL — ABNORMAL LOW (ref 1.7–2.4)
Magnesium: 0.9 mg/dL — CL (ref 1.7–2.4)
Magnesium: 1.5 mg/dL — ABNORMAL LOW (ref 1.7–2.4)
Magnesium: 1.8 mg/dL (ref 1.7–2.4)

## 2021-06-14 LAB — PHOSPHORUS
Phosphorus: 4.4 mg/dL (ref 2.5–4.6)
Phosphorus: 5.4 mg/dL — ABNORMAL HIGH (ref 2.5–4.6)
Phosphorus: 3.8 mg/dL (ref 2.5–4.6)

## 2021-06-14 LAB — LACTIC ACID, PLASMA: Lactic Acid, Venous: 4.5 mmol/L (ref 0.5–1.9)

## 2021-06-14 LAB — PREPARE RBC (CROSSMATCH)

## 2021-06-14 LAB — APTT: aPTT: 31 seconds (ref 24–36)

## 2021-06-14 SURGERY — LAPAROTOMY, EXPLORATORY
Anesthesia: General

## 2021-06-14 MED ORDER — LORAZEPAM 1 MG PO TABS
1.0000 mg | ORAL_TABLET | ORAL | Status: AC | PRN
  Filled 2021-06-14: qty 2

## 2021-06-14 MED ORDER — DEXTROSE IN LACTATED RINGERS 5 % IV SOLN: INTRAVENOUS | Status: DC

## 2021-06-14 MED ORDER — MAGNESIUM SULFATE 2 GM/50ML IV SOLN
2.0000 g | Freq: Once | INTRAVENOUS | Status: AC
  Administered 2021-06-14: 2 g via INTRAVENOUS
  Filled 2021-06-14: qty 50

## 2021-06-14 MED ORDER — LORAZEPAM 2 MG/ML IJ SOLN: 1.0000 mg | INTRAMUSCULAR | Status: DC | PRN

## 2021-06-14 MED ORDER — LACTATED RINGERS IV SOLN: INTRAVENOUS | Status: DC | PRN

## 2021-06-14 MED ORDER — SEVOFLURANE IN SOLN
RESPIRATORY_TRACT | Status: AC
  Filled 2021-06-14: qty 250

## 2021-06-14 MED ORDER — LORAZEPAM 2 MG/ML IJ SOLN: 0.0000 mg | Freq: Two times a day (BID) | INTRAMUSCULAR | Status: DC

## 2021-06-14 MED ORDER — FENTANYL CITRATE (PF) 250 MCG/5ML IJ SOLN
INTRAMUSCULAR | Status: AC
  Filled 2021-06-14: qty 5

## 2021-06-14 MED ORDER — CALCIUM CHLORIDE 10 % IV SOLN
INTRAVENOUS | Status: DC | PRN
  Administered 2021-06-14 (×2): 500 mg via INTRAVENOUS
  Administered 2021-06-14: 1 g via INTRAVENOUS

## 2021-06-14 MED ORDER — CALCIUM GLUCONATE-NACL 1-0.675 GM/50ML-% IV SOLN
1.0000 g | Freq: Once | INTRAVENOUS | Status: AC
  Administered 2021-06-14: 1000 mg via INTRAVENOUS
  Filled 2021-06-14: qty 50

## 2021-06-14 MED ORDER — INSULIN REGULAR(HUMAN) IN NACL 100-0.9 UT/100ML-% IV SOLN
INTRAVENOUS | Status: DC | PRN
  Administered 2021-06-14: 5 [IU]/h via INTRAVENOUS

## 2021-06-14 MED ORDER — HYDROMORPHONE HCL 1 MG/ML IJ SOLN
INTRAMUSCULAR | Status: AC
  Filled 2021-06-14: qty 1

## 2021-06-14 MED ORDER — ACETAMINOPHEN 650 MG RE SUPP: 650.0000 mg | Freq: Four times a day (QID) | RECTAL | Status: DC | PRN

## 2021-06-14 MED ORDER — THIAMINE HCL 100 MG/ML IJ SOLN
100.0000 mg | Freq: Once | INTRAMUSCULAR | Status: AC
  Administered 2021-06-14: 100 mg via INTRAVENOUS
  Filled 2021-06-14: qty 2

## 2021-06-14 MED ORDER — MORPHINE SULFATE (PF) 4 MG/ML IV SOLN
4.0000 mg | INTRAVENOUS | Status: DC | PRN
Start: 2021-06-14 — End: 2021-06-17
  Administered 2021-06-15 – 2021-06-16 (×3): 4 mg via INTRAVENOUS
  Filled 2021-06-14 (×3): qty 1

## 2021-06-14 MED ORDER — DEXTROSE 50 % IV SOLN
0.0000 mL | INTRAVENOUS | Status: DC | PRN
  Administered 2021-06-16 – 2021-06-23 (×3): 50 mL via INTRAVENOUS
  Administered 2021-06-23: 25 mL via INTRAVENOUS
  Filled 2021-06-14 (×3): qty 50

## 2021-06-14 MED ORDER — SUCCINYLCHOLINE CHLORIDE 200 MG/10ML IV SOSY
PREFILLED_SYRINGE | INTRAVENOUS | Status: DC | PRN
  Administered 2021-06-14: 120 mg via INTRAVENOUS

## 2021-06-14 MED ORDER — LABETALOL HCL 5 MG/ML IV SOLN
INTRAVENOUS | Status: AC
  Filled 2021-06-14: qty 4

## 2021-06-14 MED ORDER — LIDOCAINE HCL (PF) 2 % IJ SOLN
INTRAMUSCULAR | Status: AC
  Filled 2021-06-14: qty 5

## 2021-06-14 MED ORDER — ACETAMINOPHEN 325 MG PO TABS
650.0000 mg | ORAL_TABLET | Freq: Four times a day (QID) | ORAL | Status: DC | PRN
  Administered 2021-06-18 – 2021-06-21 (×2): 650 mg via ORAL
  Filled 2021-06-14 (×2): qty 2

## 2021-06-14 MED ORDER — ROCURONIUM BROMIDE 10 MG/ML (PF) SYRINGE
PREFILLED_SYRINGE | INTRAVENOUS | Status: AC
  Filled 2021-06-14: qty 10

## 2021-06-14 MED ORDER — PANTOPRAZOLE SODIUM 40 MG IV SOLR
40.0000 mg | Freq: Every day | INTRAVENOUS | Status: DC
  Administered 2021-06-14 – 2021-06-23 (×10): 40 mg via INTRAVENOUS
  Filled 2021-06-14 (×10): qty 40

## 2021-06-14 MED ORDER — PROPOFOL 10 MG/ML IV BOLUS
INTRAVENOUS | Status: AC
  Filled 2021-06-14: qty 20

## 2021-06-14 MED ORDER — BUPIVACAINE-EPINEPHRINE (PF) 0.5% -1:200000 IJ SOLN
INTRAMUSCULAR | Status: AC
  Filled 2021-06-14: qty 30

## 2021-06-14 MED ORDER — HYDROMORPHONE HCL 1 MG/ML IJ SOLN: 0.2500 mg | INTRAMUSCULAR | Status: DC | PRN

## 2021-06-14 MED ORDER — ONDANSETRON HCL 4 MG/2ML IJ SOLN
INTRAMUSCULAR | Status: DC | PRN
  Administered 2021-06-14: 4 mg via INTRAVENOUS

## 2021-06-14 MED ORDER — FOLIC ACID 1 MG PO TABS
1.0000 mg | ORAL_TABLET | Freq: Every day | ORAL | Status: DC
  Administered 2021-06-14 – 2021-06-25 (×10): 1 mg via ORAL
  Filled 2021-06-14 (×10): qty 1

## 2021-06-14 MED ORDER — FENTANYL CITRATE (PF) 100 MCG/2ML IJ SOLN
INTRAMUSCULAR | Status: DC | PRN
  Administered 2021-06-14 (×2): 50 ug via INTRAVENOUS
  Administered 2021-06-14: 100 ug via INTRAVENOUS

## 2021-06-14 MED ORDER — LACTATED RINGERS IV BOLUS
1000.0000 mL | Freq: Once | INTRAVENOUS | Status: AC
  Administered 2021-06-14: 1000 mL via INTRAVENOUS

## 2021-06-14 MED ORDER — PIPERACILLIN-TAZOBACTAM 3.375 G IVPB 30 MIN
3.3750 g | Freq: Once | INTRAVENOUS | Status: DC
  Filled 2021-06-14: qty 50

## 2021-06-14 MED ORDER — LORAZEPAM 2 MG/ML IJ SOLN
1.0000 mg | INTRAMUSCULAR | Status: AC | PRN
  Administered 2021-06-14 – 2021-06-15 (×2): 2 mg via INTRAVENOUS
  Administered 2021-06-16: 4 mg via INTRAVENOUS
  Administered 2021-06-16: 1 mg via INTRAVENOUS
  Administered 2021-06-16 (×5): 2 mg via INTRAVENOUS
  Administered 2021-06-16: 4 mg via INTRAVENOUS
  Filled 2021-06-14 (×4): qty 1
  Filled 2021-06-14 (×3): qty 2
  Filled 2021-06-14 (×3): qty 1

## 2021-06-14 MED ORDER — SODIUM CHLORIDE (PF) 0.9 % IJ SOLN
INTRAMUSCULAR | Status: DC | PRN
  Administered 2021-06-14: 100 mL via SURGICAL_CAVITY

## 2021-06-14 MED ORDER — HYDROMORPHONE HCL 1 MG/ML IJ SOLN
INTRAMUSCULAR | Status: DC | PRN
  Administered 2021-06-14: 1 mg via INTRAVENOUS

## 2021-06-14 MED ORDER — ACETAMINOPHEN 10 MG/ML IV SOLN
1000.0000 mg | Freq: Four times a day (QID) | INTRAVENOUS | Status: AC
  Administered 2021-06-14 – 2021-06-15 (×3): 1000 mg via INTRAVENOUS
  Filled 2021-06-14 (×4): qty 100

## 2021-06-14 MED ORDER — PROMETHAZINE HCL 25 MG/ML IJ SOLN: 6.2500 mg | INTRAMUSCULAR | Status: DC | PRN

## 2021-06-14 MED ORDER — INSULIN GLARGINE-YFGN 100 UNIT/ML ~~LOC~~ SOLN
15.0000 [IU] | Freq: Every day | SUBCUTANEOUS | Status: DC
  Administered 2021-06-14 – 2021-06-15 (×2): 15 [IU] via SUBCUTANEOUS
  Filled 2021-06-14 (×3): qty 0.15

## 2021-06-14 MED ORDER — BUPIVACAINE LIPOSOME 1.3 % IJ SUSP
INTRAMUSCULAR | Status: AC
  Filled 2021-06-14: qty 20

## 2021-06-14 MED ORDER — CALCIUM CHLORIDE 10 % IV SOLN
INTRAVENOUS | Status: AC
  Filled 2021-06-14: qty 10

## 2021-06-14 MED ORDER — THIAMINE HCL 100 MG/ML IJ SOLN
100.0000 mg | Freq: Every day | INTRAMUSCULAR | Status: DC
  Administered 2021-06-16: 100 mg via INTRAVENOUS
  Filled 2021-06-14: qty 2

## 2021-06-14 MED ORDER — LACTATED RINGERS IV SOLN: INTRAVENOUS | Status: DC

## 2021-06-14 MED ORDER — INSULIN REGULAR(HUMAN) IN NACL 100-0.9 UT/100ML-% IV SOLN: INTRAVENOUS | Status: DC

## 2021-06-14 MED ORDER — IOHEXOL 350 MG/ML SOLN
100.0000 mL | Freq: Once | INTRAVENOUS | Status: AC | PRN
  Administered 2021-06-14: 100 mL via INTRAVENOUS

## 2021-06-14 MED ORDER — PROTHROMBIN COMPLEX CONC HUMAN 500 UNITS IV KIT
5000.0000 [IU] | PACK | Status: AC
  Administered 2021-06-14: 5000 [IU] via INTRAVENOUS
  Filled 2021-06-14: qty 5000

## 2021-06-14 MED ORDER — FOLIC ACID 1 MG PO TABS: 1.0000 mg | ORAL_TABLET | Freq: Every day | ORAL | Status: DC

## 2021-06-14 MED ORDER — ACETAMINOPHEN 10 MG/ML IV SOLN
INTRAVENOUS | Status: AC
  Administered 2021-06-14: 1000 mg
  Filled 2021-06-14: qty 100

## 2021-06-14 MED ORDER — SODIUM CHLORIDE 0.9 % IV SOLN: INTRAVENOUS | Status: DC | PRN

## 2021-06-14 MED ORDER — LORAZEPAM 2 MG/ML IJ SOLN: 0.0000 mg | Freq: Four times a day (QID) | INTRAMUSCULAR | Status: DC

## 2021-06-14 MED ORDER — PHENYLEPHRINE HCL (PRESSORS) 10 MG/ML IV SOLN
INTRAVENOUS | Status: AC
  Filled 2021-06-14: qty 1

## 2021-06-14 MED ORDER — KETAMINE HCL 10 MG/ML IJ SOLN
INTRAMUSCULAR | Status: DC | PRN
  Administered 2021-06-14: 75 mg via INTRAVENOUS

## 2021-06-14 MED ORDER — FUROSEMIDE 20 MG PO TABS
40.0000 mg | ORAL_TABLET | Freq: Every day | ORAL | Status: DC
  Administered 2021-06-14: 40 mg via ORAL
  Filled 2021-06-14: qty 2

## 2021-06-14 MED ORDER — MIDAZOLAM HCL 2 MG/2ML IJ SOLN
INTRAMUSCULAR | Status: DC | PRN
  Administered 2021-06-14: 2 mg via INTRAVENOUS

## 2021-06-14 MED ORDER — SUGAMMADEX SODIUM 200 MG/2ML IV SOLN
INTRAVENOUS | Status: DC | PRN
  Administered 2021-06-14 (×2): 200 mg via INTRAVENOUS

## 2021-06-14 MED ORDER — ONDANSETRON HCL 4 MG/2ML IJ SOLN
4.0000 mg | Freq: Four times a day (QID) | INTRAMUSCULAR | Status: DC | PRN
  Administered 2021-06-15 – 2021-06-20 (×6): 4 mg via INTRAVENOUS
  Filled 2021-06-14 (×6): qty 2

## 2021-06-14 MED ORDER — ONDANSETRON 4 MG PO TBDP
4.0000 mg | ORAL_TABLET | Freq: Four times a day (QID) | ORAL | Status: DC | PRN
  Filled 2021-06-14: qty 1

## 2021-06-14 MED ORDER — HYDROCODONE-ACETAMINOPHEN 5-325 MG PO TABS
1.0000 | ORAL_TABLET | ORAL | Status: DC | PRN
  Administered 2021-06-15: 1 via ORAL
  Filled 2021-06-14: qty 2
  Filled 2021-06-14: qty 1
  Filled 2021-06-14: qty 2

## 2021-06-14 MED ORDER — 0.9 % SODIUM CHLORIDE (POUR BTL) OPTIME
TOPICAL | Status: DC | PRN
  Administered 2021-06-14: 7000 mL

## 2021-06-14 MED ORDER — LABETALOL HCL 5 MG/ML IV SOLN
5.0000 mg | INTRAVENOUS | Status: DC | PRN
  Administered 2021-06-14: 5 mg via INTRAVENOUS

## 2021-06-14 MED ORDER — PHENYLEPHRINE HCL (PRESSORS) 10 MG/ML IV SOLN
INTRAVENOUS | Status: DC | PRN
  Administered 2021-06-14: 100 ug via INTRAVENOUS
  Administered 2021-06-14 (×3): 160 ug via INTRAVENOUS
  Administered 2021-06-14 (×2): 100 ug via INTRAVENOUS

## 2021-06-14 MED ORDER — LIDOCAINE HCL (CARDIAC) PF 100 MG/5ML IV SOSY
PREFILLED_SYRINGE | INTRAVENOUS | Status: DC | PRN
  Administered 2021-06-14: 100 mg via INTRAVENOUS

## 2021-06-14 MED ORDER — ROCURONIUM BROMIDE 100 MG/10ML IV SOLN
INTRAVENOUS | Status: DC | PRN
  Administered 2021-06-14: 20 mg via INTRAVENOUS
  Administered 2021-06-14: 10 mg via INTRAVENOUS
  Administered 2021-06-14: 30 mg via INTRAVENOUS

## 2021-06-14 MED ORDER — CALCIUM GLUCONATE-NACL 1-0.675 GM/50ML-% IV SOLN
1.0000 g | Freq: Once | INTRAVENOUS | Status: AC
  Administered 2021-06-14: 1 g via INTRAVENOUS
  Filled 2021-06-14: qty 50

## 2021-06-14 MED ORDER — INSULIN REGULAR NEW PEDIATRIC IV INFUSION >5 KG - SIMPLE MED: 1.0000 [IU]/h | INTRAVENOUS | Status: DC

## 2021-06-14 MED ORDER — INSULIN ASPART 100 UNIT/ML IJ SOLN
INTRAMUSCULAR | Status: AC
  Filled 2021-06-14: qty 1

## 2021-06-14 MED ORDER — NICOTINE 21 MG/24HR TD PT24
21.0000 mg | MEDICATED_PATCH | Freq: Every day | TRANSDERMAL | Status: DC
  Administered 2021-06-14 – 2021-06-25 (×12): 21 mg via TRANSDERMAL
  Filled 2021-06-14 (×13): qty 1

## 2021-06-14 MED ORDER — THIAMINE HCL 100 MG PO TABS
100.0000 mg | ORAL_TABLET | Freq: Every day | ORAL | Status: DC
  Administered 2021-06-14 – 2021-06-15 (×2): 100 mg via ORAL
  Filled 2021-06-14 (×2): qty 1

## 2021-06-14 MED ORDER — PIPERACILLIN-TAZOBACTAM 3.375 G IVPB
3.3750 g | Freq: Three times a day (TID) | INTRAVENOUS | Status: DC
  Administered 2021-06-14 – 2021-06-16 (×8): 3.375 g via INTRAVENOUS
  Filled 2021-06-14 (×7): qty 50

## 2021-06-14 MED ORDER — SUCCINYLCHOLINE CHLORIDE 200 MG/10ML IV SOSY
PREFILLED_SYRINGE | INTRAVENOUS | Status: AC
  Filled 2021-06-14: qty 10

## 2021-06-14 MED ORDER — ADULT MULTIVITAMIN W/MINERALS CH
1.0000 | ORAL_TABLET | Freq: Every day | ORAL | Status: DC
  Administered 2021-06-14 – 2021-06-15 (×2): 1 via ORAL
  Filled 2021-06-14 (×2): qty 1

## 2021-06-14 MED ORDER — PROPOFOL 10 MG/ML IV BOLUS
INTRAVENOUS | Status: DC | PRN
  Administered 2021-06-14: 150 mg via INTRAVENOUS

## 2021-06-14 MED ORDER — KETAMINE HCL 50 MG/ML IJ SOLN
INTRAMUSCULAR | Status: AC
  Filled 2021-06-14: qty 1

## 2021-06-14 MED ORDER — HYDROXYZINE HCL 25 MG PO TABS
12.5000 mg | ORAL_TABLET | Freq: Two times a day (BID) | ORAL | Status: DC | PRN
  Filled 2021-06-14: qty 1

## 2021-06-14 MED ORDER — POTASSIUM CHLORIDE 10 MEQ/100ML IV SOLN
10.0000 meq | INTRAVENOUS | Status: AC
  Administered 2021-06-14 (×3): 10 meq via INTRAVENOUS
  Filled 2021-06-14 (×3): qty 100

## 2021-06-14 MED ORDER — INSULIN REGULAR(HUMAN) IN NACL 100-0.9 UT/100ML-% IV SOLN
INTRAVENOUS | Status: DC
  Administered 2021-06-14: 5 [IU]/h via INTRAVENOUS

## 2021-06-14 MED ORDER — LORAZEPAM 2 MG/ML IJ SOLN
1.0000 mg | Freq: Once | INTRAMUSCULAR | Status: AC
  Administered 2021-06-14: 1 mg via INTRAVENOUS
  Filled 2021-06-14: qty 1

## 2021-06-14 MED ORDER — INSULIN ASPART 100 UNIT/ML IJ SOLN
0.0000 [IU] | INTRAMUSCULAR | Status: DC
  Administered 2021-06-15: 5 [IU] via SUBCUTANEOUS
  Administered 2021-06-15 – 2021-06-16 (×4): 3 [IU] via SUBCUTANEOUS
  Filled 2021-06-14 (×5): qty 1

## 2021-06-14 MED ORDER — MORPHINE SULFATE (PF) 4 MG/ML IV SOLN
4.0000 mg | Freq: Once | INTRAVENOUS | Status: AC
Start: 2021-06-14 — End: 2021-06-14
  Administered 2021-06-14: 4 mg via INTRAVENOUS
  Filled 2021-06-14: qty 1

## 2021-06-14 MED ORDER — DEXTROSE 50 % IV SOLN: 0.0000 mL | INTRAVENOUS | Status: DC | PRN

## 2021-06-14 MED ORDER — METOPROLOL TARTRATE 50 MG PO TABS
100.0000 mg | ORAL_TABLET | Freq: Two times a day (BID) | ORAL | Status: DC
  Administered 2021-06-14 – 2021-06-15 (×3): 100 mg via ORAL
  Filled 2021-06-14 (×4): qty 2

## 2021-06-14 MED ORDER — TRAZODONE HCL 50 MG PO TABS
50.0000 mg | ORAL_TABLET | Freq: Every day | ORAL | Status: DC
  Administered 2021-06-15: 50 mg via ORAL
  Filled 2021-06-14 (×2): qty 1

## 2021-06-14 MED ORDER — POTASSIUM CHLORIDE 10 MEQ/100ML IV SOLN
10.0000 meq | INTRAVENOUS | Status: AC
Start: 2021-06-14 — End: 2021-06-14
  Administered 2021-06-14 (×6): 10 meq via INTRAVENOUS
  Filled 2021-06-14 (×6): qty 100

## 2021-06-14 MED ORDER — LORAZEPAM 1 MG PO TABS: 1.0000 mg | ORAL_TABLET | ORAL | Status: DC | PRN

## 2021-06-14 MED ORDER — CHLORHEXIDINE GLUCONATE CLOTH 2 % EX PADS
6.0000 | MEDICATED_PAD | Freq: Every day | CUTANEOUS | Status: DC
  Administered 2021-06-14 – 2021-06-21 (×9): 6 via TOPICAL

## 2021-06-14 MED ORDER — MIDAZOLAM HCL 2 MG/2ML IJ SOLN
INTRAMUSCULAR | Status: AC
  Filled 2021-06-14: qty 2

## 2021-06-14 SURGICAL SUPPLY — 46 items
APL PRP STRL LF DISP 70% ISPRP (MISCELLANEOUS) ×2
BULB RESERV EVAC DRAIN JP 100C (MISCELLANEOUS) ×3 IMPLANT
CANISTER SUCT 1200ML W/VALVE (MISCELLANEOUS) ×3 IMPLANT
CHLORAPREP W/TINT 26 (MISCELLANEOUS) ×3 IMPLANT
DRAIN CHANNEL JP 19F (MISCELLANEOUS) ×3 IMPLANT
DRAPE LAPAROTOMY 100X77 ABD (DRAPES) ×3 IMPLANT
DRSG OPSITE POSTOP 4X10 (GAUZE/BANDAGES/DRESSINGS) ×3 IMPLANT
DRSG OPSITE POSTOP 4X12 (GAUZE/BANDAGES/DRESSINGS) ×3 IMPLANT
DRSG OPSITE POSTOP 4X8 (GAUZE/BANDAGES/DRESSINGS) ×3 IMPLANT
DRSG TEGADERM 4X10 (GAUZE/BANDAGES/DRESSINGS) ×3 IMPLANT
DRSG TELFA 3X8 NADH (GAUZE/BANDAGES/DRESSINGS) ×3 IMPLANT
ELECT REM PT RETURN 9FT ADLT (ELECTROSURGICAL) ×3
ELECTRODE REM PT RTRN 9FT ADLT (ELECTROSURGICAL) ×2 IMPLANT
GAUZE 4X4 16PLY ~~LOC~~+RFID DBL (SPONGE) ×3 IMPLANT
GAUZE PACKING 1/4 X5 YD (GAUZE/BANDAGES/DRESSINGS) ×3 IMPLANT
GAUZE SPONGE 4X4 12PLY STRL (GAUZE/BANDAGES/DRESSINGS) ×3 IMPLANT
GLOVE SURG ENC MOIS LTX SZ6.5 (GLOVE) ×3 IMPLANT
GLOVE SURG UNDER POLY LF SZ6.5 (GLOVE) ×3 IMPLANT
GOWN STRL REUS W/ TWL LRG LVL3 (GOWN DISPOSABLE) ×4 IMPLANT
GOWN STRL REUS W/TWL LRG LVL3 (GOWN DISPOSABLE) ×6
KIT OSTOMY 2 PC DRNBL 2.25 STR (WOUND CARE) ×2 IMPLANT
KIT OSTOMY DRAINABLE 2.25 STR (WOUND CARE) ×3
KIT TURNOVER KIT A (KITS) ×3 IMPLANT
LABEL OR SOLS (LABEL) ×3 IMPLANT
LIGASURE IMPACT 36 18CM CVD LR (INSTRUMENTS) ×3 IMPLANT
MANIFOLD NEPTUNE II (INSTRUMENTS) ×3 IMPLANT
NEEDLE HYPO 22GX1.5 SAFETY (NEEDLE) ×3 IMPLANT
NS IRRIG 1000ML POUR BTL (IV SOLUTION) ×21 IMPLANT
PACK BASIN MAJOR ARMC (MISCELLANEOUS) ×3 IMPLANT
PACK COLON CLEAN CLOSURE (MISCELLANEOUS) ×3 IMPLANT
RELOAD PROXIMATE 75MM BLUE (ENDOMECHANICALS) ×3 IMPLANT
SPONGE T-LAP 18X18 ~~LOC~~+RFID (SPONGE) ×9 IMPLANT
STAPLER CVD CUT BL 40 RELOAD (ENDOMECHANICALS) ×3 IMPLANT
STAPLER PROXIMATE 75MM BLUE (STAPLE) ×3 IMPLANT
STAPLER SKIN PROX 35W (STAPLE) ×3 IMPLANT
SUT PDS AB 0 CT1 27 (SUTURE) ×6 IMPLANT
SUT PDS AB 1 TP1 54 (SUTURE) ×6 IMPLANT
SUT SILK 2 0 (SUTURE) ×3
SUT SILK 2-0 18XBRD TIE 12 (SUTURE) ×2 IMPLANT
SUT SILK 3 0 (SUTURE) ×3
SUT SILK 3-0 18XBRD TIE 12 (SUTURE) ×2 IMPLANT
SUT VIC AB 3-0 SH 27 (SUTURE) ×6
SUT VIC AB 3-0 SH 27X BRD (SUTURE) ×4 IMPLANT
SUT VICRYL 3-0 CR8 SH (SUTURE) ×3 IMPLANT
SYR 20ML LL LF (SYRINGE) ×6 IMPLANT
TRAY FOLEY MTR SLVR 16FR STAT (SET/KITS/TRAYS/PACK) ×3 IMPLANT

## 2021-06-14 NOTE — Anesthesia Postprocedure Evaluation (Signed)
Anesthesia Post Note  Patient: Russell Gomez  Procedure(s) Performed: EXPLORATORY LAPAROTOMY COLON RESECTION SIGMOID WITH END COLOSTOMY CREATION APPENDECTOMY  Patient location during evaluation: PACU Anesthesia Type: General Level of consciousness: awake and alert Pain management: pain level controlled Vital Signs Assessment: post-procedure vital signs reviewed and stable Respiratory status: spontaneous breathing, nonlabored ventilation, respiratory function stable and patient connected to nasal cannula oxygen Cardiovascular status: blood pressure returned to baseline and stable Postop Assessment: no apparent nausea or vomiting Anesthetic complications: no Comments: Patient with swollen LUE after 18G in AC infiltrated-warm compress applied, swelling is resolving  To ICU with insulin drip    No notable events documented.   Last Vitals:  Vitals:   06/14/21 0900 06/14/21 0915  BP: 139/87 121/78  Pulse: (!) 127 (!) 102  Resp: 18 15  Temp:  36.7 C  SpO2: 91% 95%    Last Pain:  Vitals:   06/14/21 0915  TempSrc:   PainSc: Asleep                 Shareece Bultman
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
## 2021-06-14 NOTE — Op Note (Signed)
Preoperative diagnosis: Diverticular disease with perforation.  Postoperative diagnosis: Diverticular disease with perforation.  Procedure: Sigmoid colon resection with colostomy (Hartmann's procedure).                      Appendectomy  Anesthesia: GETA  Surgeon: Dr. Windell Moment, MD  Wound Classification: Dirty  Findings: Generalized purulent peritonitis Thickening and perforation of sigmoid colon No creeping fat or small intestine inflammation identified. Adequate hemostasis  Indications:  Patient is a 60 y.o. male with one day of abdominal pain was found to have an acute perforation of the intestine. Emergent exploratory laparotomy was indicated.  Description of procedure:  The patient was placed in the supine position and general endotracheal anesthesia was induced. A time-out was completed verifying correct patient, procedure, site, positioning, and implant(s) and/or special equipment prior to beginning this procedure. Preoperative antibiotics were given. A Foley catheter and nasogastric tube were placed. The abdomen was prepped and draped in the usual sterile fashion. A vertical midline incision was made from supraumbilical area to just above the pubis. This was deepened through the subcutaneous tissues and hemostasis was achieved with electrocautery. The linea alba was identified and incised and the peritoneal cavity entered. The abdomen was explored. Immediately free air gush out of the abdomen. Abundant amount of generalized infection with purulent peritonitis throughout the abdomen was identified. The stomach and small bowel was inspected without any pathology identified.  The small bowel was inspected and retracted to the right using a moist towel and self-retaining retractor. Using electrocautery, the colon was freed from its peritoneal attachments. Both ureters were identified and protected. The appendix was found attached to the area of perforation and was resected with double  ligature of the base with 3-0 silk. Mesoappendix divided with LigaSure device.  Points of transection were selected proximally and distally. The bowel was divided with the linear cutting stapler proximally and With Contour distally. The peritoneum overlying the mesentery was then scored with electrocautery. The mesentery was divided with LigaSure device. The specimen was removed. The abdominal cavity was then copiously irrigated and hemostasis was checked.  The proximal colon reached easily to the proposed colostomy site without tension. A disk of skin was removed from the colostomy site in the left lower quadrant. The incision was deepened through all layers of the abdominal wall and dilated to admit two fingers. The colon was passed out through the ostomy site without torsion or tension.    The Hartmann's pouch was tagged with two long sutures of 2-0 prolene and allowed to fall into the pelvis.  A closed suction drain was placed in the pelvis and brought out through separate stab wounds lateral to the incision. These were secured with 3-0 nylon.  The fascia was closed with a running suture of PDS 0. The skin was closed with skin staples.  The colostomy was matured with multiple interrupted sutures of 3-0 Vicryl. An ostomy bag was applied.  The patient tolerated the procedure well and was taken to the postanesthesia care unit in stable condition.   Specimen: Sigmoid colon                     Appendix  Complications: None  EBL: 100 mL

## 2021-06-14 NOTE — Consult Note (Signed)
Medical Consultation   Russell Gomez  HGD:[id number]  DOB: 06-07-1961  DOA: 06/14/2021  PCP: Ria Bush, MD   Requesting physician: Dr Peyton Najjar  Reason for consultation: Management of medical problems   History of Present Illness: Russell Gomez is an 60 y.o. male Atrial fibrillation on Eliquis, type 1 diabetes, nicotine dependence, moderate-severe alcohol use disorder, hospitalized from 7/20-7/22 with nausea vomiting and diarrhea associated with several electrolyte abnormalities who presents to the ED with a 3-day complaint of abdominal pain that became acutely worse on the night of arrival.  States that he had not taken a drink in 3 days because of the pain.  He states that his nausea and vomiting and diarrhea has been ongoing for couple years and continued after his recent hospital stay.  He denies fever and chills, cough chest pain or shortness of breath.  ED course on arrival temperature 99.1, pulse 130, BP 105/64 with O2 sat 95% on room air. Blood work significant for WBC 11,600, glucose 443 with normal anion gap and venous pH 7.5, LFTs at baseline with AST of 58, ALT 32 and alk phos 200, bili 2.0, lipase normal.  Calcium 6.4, magnesium 1.0  EKG, personally viewed and interpreted: Sinus tachycardia at 114 with no acute ST-T wave changes   Imaging: CT abdomen and pelvis shows multifocal punctate free air in peritoneal cavity indicating perforation at an unknown location Other findings consistent with Crohn's disease and chronic inflammatory bowel disease  The ED provider contacted surgeon, Dr. Peyton Najjar who will take patient to the OR and hospitalist consulted requested.  While in the ER patient received IV calcium gluconate, IV magnesium, as well as Kcentra for Eliquis reversal.     Review of Systems:   As per HPI otherwise 10 point review of systems negative.     Past Medical History: Past Medical History:  Diagnosis Date   Arthritis    on operative  finger   Dental crowns present    Diabetes type 1, uncontrolled ([place])    IDDM (Dr. Eddie Dibbles @ [facility])   Esophageal reflux    Goiter 04/2012   no current med, has yearly monitoring   High cholesterol    HTN (hypertension)    under control with meds, has been on med x 2 yr.    Past Surgical History: Past Surgical History:  Procedure Laterality Date   DISTAL INTERPHALANGEAL JOINT FUSION Right 03/05/2014   Procedure: DEBRIDEMENT (DIP) DISTAL INTERPHALANGEAL RIGHT MIDDLE FINGER;  Surgeon: Wynonia Sours, MD;  Location: [facility];  Service: Orthopedics;  Laterality: Right;   KNEE ARTHROSCOPY     MASS EXCISION Right 03/05/2014   Procedure: EXCISION CYST ;  Surgeon: Wynonia Sours, MD;  Location: [facility];  Service: Orthopedics;  Laterality: Right;  ANESTHESIA: IV REGINAL FAB   OPEN REDUCTION NASAL FRACTURE  12/26/2008   with closure of nasal lac.   ORIF DISTAL RADIUS FRACTURE Right 12/26/2008   ORIF WRIST FRACTURE Left 05/12/2016   takedown of nonunion/malunion and OPEN REDUCTION INTERNAL FIXATION (ORIF) WRIST FRACTURE;  Surgeon: Corky Mull, MD   PERCUTANEOUS PINNING Left 02/06/2013   Procedure: PINNING PIP OF THE LEFT MIDDLE FINGER ;  Surgeon: Wynonia Sours, MD;  Location: [facility];  Service: Orthopedics;  Laterality: Left;   SKIN GRAFT SPLIT THICKNESS LEG / FOOT Right 1985   thigh after trauma vs machine at work  TRIGGER FINGER RELEASE  11/28/2012   Procedure: RELEASE TRIGGER FINGER/A-1 PULLEY;  Surgeon: Wynonia Sours, MD;  Laterality: Left;  EXCISION MASS LEFT RING FINGER, RELEASE A-1 PULLEY LEFT RING FINGER (ganglion cyst)     Allergies:   Allergies  Allergen Reactions   Insulin Aspart Other (See Comments)    CELLULITIS   Spironolactone Nausea Only and Rash   Hydrochlorothiazide Itching and Rash   Morphine Other (See Comments)    "MAKES ME MEAN"     Social History:  reports that he has been smoking cigarettes. He has a 20.00 pack-year smoking  history. He has never used smokeless tobacco. He reports current alcohol use of about 7.0 standard drinks of alcohol per week. He reports that he does not use drugs.   Family History: Family History  Problem Relation Age of Onset   Healthy Father    Hypertension Mother    Hyperlipidemia Mother    Diabetes Maternal Uncle    Coronary artery disease Maternal Uncle    Stroke Maternal Aunt    Diabetes Paternal Grandmother    Diabetes Paternal Uncle     Physical Exam: Vitals:   06/13/21 1949 06/13/21 1958 06/14/21 0121 06/14/21 0221  BP: 105/64 105/64 122/63 129/69  Pulse: (!) 130 (!) 130 (!) 116 (!) 113  Resp: 20 (!) 22 18 (!) 22  Temp: 99.1 F (37.3 C)  98.5 F (36.9 C)   TempSrc: Oral Oral Oral   SpO2: 95% 95% 94% 97%  Weight:      Height:        Constitutional:  Alert and awake, oriented x3, not in any acute distress. Eyes: PERLA, EOMI, irises appear normal, anicteric sclera,  ENMT: external ears and nose appear normal,            Lips appears normal, oropharynx mucosa, tongue, posterior pharynx appear normal  Neck: neck appears normal, no masses, normal ROM, no thyromegaly, no JVD  CVS: S1-S2 clear, tachycardicno murmur rubs or gallops, no LE edema, normal pedal pulses  Respiratory:  clear to auscultation bilaterally, no wheezing, rales or rhonchi. Respiratory effort normal. No accessory muscle use.  Abdomen: deferred to surgery Musculoskeletal: : no cyanosis, clubbing or edema noted bilaterally                  Neuro: no gross deficits Psych: judgement and insight appear normal, stable mood and affect, mental status Skin: no rashes or lesions or ulcers, no induration or nodules    Data reviewed:  I have personally reviewed following labs and imaging studies Labs:  CBC: Recent Labs  Lab 06/13/21 2005  WBC 11.6*  HGB 14.5  HCT 41.7  MCV 97.9  PLT 657    Basic Metabolic Panel: Recent Labs  Lab 06/13/21 2005  NA 134*  K 3.5  CL 97*  CO2 22  GLUCOSE 443*   BUN 5*  CREATININE 0.80  CALCIUM 6.4*  MG 1.0*   GFR Estimated Creatinine Clearance: 117 mL/min (by C-G formula based on SCr of 0.8 mg/dL). Liver Function Tests: Recent Labs  Lab 06/13/21 2005  AST 58*  ALT 32  ALKPHOS 200*  BILITOT 2.0*  PROT 5.8*  ALBUMIN 2.4*   Recent Labs  Lab 06/13/21 2005  LIPASE 18   No results for input(s): AMMONIA in the last 168 hours. Coagulation profile Recent Labs  Lab 06/14/21 0348  INR 1.3*    Cardiac Enzymes: No results for input(s): CKTOTAL, CKMB, CKMBINDEX, TROPONINI in the last 168 hours. BNP:  Invalid input(s): POCBNP CBG: Recent Labs  Lab 06/13/21 2019  GLUCAP 479*   D-Dimer No results for input(s): DDIMER in the last 72 hours. Hgb A1c No results for input(s): HGBA1C in the last 72 hours. Lipid Profile No results for input(s): CHOL, HDL, LDLCALC, TRIG, CHOLHDL, LDLDIRECT in the last 72 hours. Thyroid function studies No results for input(s): TSH, T4TOTAL, T3FREE, THYROIDAB in the last 72 hours.  Invalid input(s): FREET3 Anemia work up No results for input(s): VITAMINB12, FOLATE, FERRITIN, TIBC, IRON, RETICCTPCT in the last 72 hours. Urinalysis    Component Value Date/Time   COLORURINE AMBER (A) 06/14/2021 0348   APPEARANCEUR HAZY (A) 06/14/2021 0348   LABSPEC 1.030 06/14/2021 0348   PHURINE 5.0 06/14/2021 0348   GLUCOSEU >=500 (A) 06/14/2021 0348   HGBUR NEGATIVE 06/14/2021 0348   BILIRUBINUR NEGATIVE 06/14/2021 0348   BILIRUBINUR 1+ 04/16/2020 1242   KETONESUR 5 (A) 06/14/2021 0348   PROTEINUR NEGATIVE 06/14/2021 0348   UROBILINOGEN 1.0 04/16/2020 1242   UROBILINOGEN 0.2 01/14/2010 0100   NITRITE NEGATIVE 06/14/2021 0348   LEUKOCYTESUR NEGATIVE 06/14/2021 0348     Microbiology Recent Results (from the past 240 hour(s))  Resp Panel by RT-PCR (Flu A&B, Covid) Nasopharyngeal Swab     Status: None   Collection Time: 06/14/21  3:48 AM   Specimen: Nasopharyngeal Swab; Nasopharyngeal(NP) swabs in vial  transport medium  Result Value Ref Range Status   SARS Coronavirus 2 by RT PCR NEGATIVE NEGATIVE Final    Comment: (NOTE) SARS-CoV-2 target nucleic acids are NOT DETECTED.  The SARS-CoV-2 RNA is generally detectable in upper respiratory specimens during the acute phase of infection. The lowest concentration of SARS-CoV-2 viral copies this assay can detect is 138 copies/mL. A negative result does not preclude SARS-Cov-2 infection and should not be used as the sole basis for treatment or other patient management decisions. A negative result may occur with  improper specimen collection/handling, submission of specimen other than nasopharyngeal swab, presence of viral mutation(s) within the areas targeted by this assay, and inadequate number of viral copies(<138 copies/mL). A negative result must be combined with clinical observations, patient history, and epidemiological information. The expected result is Negative.  Fact Sheet for Patients:  EntrepreneurPulse.com.au  Fact Sheet for Healthcare Providers:  IncredibleEmployment.be  This test is no t yet approved or cleared by the [place] FDA and  has been authorized for detection and/or diagnosis of SARS-CoV-2 by FDA under an Emergency Use Authorization (EUA). This EUA will remain  in effect (meaning this test can be used) for the duration of the COVID-19 declaration under Section 564(b)(1) of the Act, 21 U.S.C.section 360bbb-3(b)(1), unless the authorization is terminated  or revoked sooner.       Influenza A by PCR NEGATIVE NEGATIVE Final   Influenza B by PCR NEGATIVE NEGATIVE Final    Comment: (NOTE) The Xpert Xpress SARS-CoV-2/FLU/RSV plus assay is intended as an aid in the diagnosis of influenza from Nasopharyngeal swab specimens and should not be used as a sole basis for treatment. Nasal washings and aspirates are unacceptable for Xpert Xpress SARS-CoV-2/FLU/RSV testing.  Fact  Sheet for Patients: EntrepreneurPulse.com.au  Fact Sheet for Healthcare Providers: IncredibleEmployment.be  This test is not yet approved or cleared by the [place] FDA and has been authorized for detection and/or diagnosis of SARS-CoV-2 by FDA under an Emergency Use Authorization (EUA). This EUA will remain in effect (meaning this test can be used) for the duration of the COVID-19 declaration under Section 564(b)(1) of the  Act, 21 U.S.C. section 360bbb-3(b)(1), unless the authorization is terminated or revoked.  Performed at [facility] [address] [place], [place] [address]        Inpatient Medications:   Scheduled Meds:  folic acid  1 mg Oral Daily   furosemide  40 mg Oral Daily   metoprolol tartrate  100 mg Oral BID   pantoprazole (PROTONIX) IV  40 mg Intravenous QHS   traZODone  50 mg Oral QHS   Continuous Infusions:  sodium chloride 125 mL/hr at 06/14/21 0225   calcium gluconate     lactated ringers     magnesium sulfate bolus IVPB 2 g (06/14/21 0443)   piperacillin-tazobactam (ZOSYN)  IV 3.375 g (06/14/21 0429)   prothrombin complex conc human (Kcentra) IVPB       Radiological Exams on Admission: CT ABDOMEN PELVIS W CONTRAST  Result Date: 06/14/2021 CLINICAL DATA:  Nausea and vomiting EXAM: CT ABDOMEN AND PELVIS WITH CONTRAST TECHNIQUE: Multidetector CT imaging of the abdomen and pelvis was performed using the standard protocol following bolus administration of intravenous contrast. CONTRAST:  182m OMNIPAQUE IOHEXOL 350 MG/ML SOLN COMPARISON:  None. FINDINGS: LOWER CHEST: Normal. HEPATOBILIARY: Diffuse hypoattenuation of the liver relative to the spleen suggests hepatic steatosis. No focal liver lesion or biliary dilatation. The gallbladder is normal. PANCREAS: Normal pancreas. No ductal dilatation or peripancreatic fluid collection. SPLEEN: Normal. ADRENALS/URINARY TRACT: The adrenal glands are normal. No  hydronephrosis, nephroureterolithiasis or solid renal mass. The urinary bladder is normal for degree of distention STOMACH/BOWEL: There is a punctate focus free air in the ventral abdomen (series 2, image 30). There is no hiatal hernia. Normal duodenal course and caliber. Small amount of free fluid interleaved between loops of small bowel. There is hyperenhancement of the wall of the terminal ileum. Rectosigmoid diverticulosis without acute inflammation. There is intramural fat deposition within the proximal ascending colon and the descending colon. Normal appendix. VASCULAR/LYMPHATIC: There is calcific atherosclerosis of the abdominal aorta. No lymphadenopathy. REPRODUCTIVE: Normal prostate size with symmetric seminal vesicles. MUSCULOSKELETAL. No bony spinal canal stenosis or focal osseous abnormality. OTHER: None. IMPRESSION: 1. Hyperenhancement of the wall of the terminal ileum with small amount of free fluid and punctate focus of free air in the ventral abdomen, suggesting acute Crohn's disease. 2. Multifocal punctate free air within the peritoneal cavity indicating perforation at an unknown location. 3. Submucosal fat deposition within the proximal ascending colon and descending colon, consistent with chronic inflammatory bowel disease. Critical Value/emergent results were called by telephone at the time of interpretation on 06/14/2021 at 3:24 am [facility] who verbally acknowledged these results. Aortic Atherosclerosis (ICD10-I70.0). Electronically Signed   By: KUlyses JarredM.D.   On: 06/14/2021 03:24    Impression/Recommendations Medical consult for management of medical problems on 60year old male with history of atrial fibrillation on Eliquis, type 1 diabetes, nicotine dependence, moderate-severe alcohol use disorder, admitted to the surgical service with bowel perforation   Bowel perforation ([place] -Patient being taken emergently to the OR - Management per surgery    AF (paroxysmal  atrial fibrillation) ( - Kcentra reversal of Eliquis given in the ED preoperatively - Resume Eliquis when appropriate postop - Metoprolol IV every 6 for rate control    Hypocalcemia   Hypomagnesemia - Secondary to chronic GI losses - Calcium gluconate and IV mag given in the ED - Continue to monitor and replete electrolytes - IV hydration    Type 1 diabetes mellitus with hyperglycemia - Blood sugar 443  but no evidence of DKA - Sliding scale every 4 as patient will be n.p.o. postop and can consider IV insulin while recovering    Terminal ileitis/chronic inflammatory bowel disease on CT - Patient reports a 2-year history of nausea vomiting diarrhea and abdominal pain, with recent hospitalization from 7/20-7/22 for same - CT with findings concerning for chronic inflammatory bowel disease - GI consult when appropriate following his emergency surgery    Essential hypertension - As needed IV antihypertensives while n.p.o.    Alcohol use disorder, moderate, dependence ([place]) - CIWA withdrawal protocol - Patient drinks 1/5 of liquor daily and has not taken a drink in 3 days  Nicotine dependence - Patient smokes a pack and a half of cigarettes per day - Nicotine patch    Thank you for this consultation.  Our [facility] hospitalist team will follow the patient with you.   Time Spent: 60  Athena Masse M.D. [facility] 06/14/2021, 5:00 AM

## 2021-06-14 NOTE — Progress Notes (Addendum)
Inpatient Diabetes Program Recommendations  AACE/ADA: New Consensus Statement on Inpatient Glycemic Control (2015)  Target Ranges:  Prepandial:   less than 140 mg/dL      Peak postprandial:   less than 180 mg/dL (1-2 hours)      Critically ill patients:  140 - 180 mg/dL  Results for DARRAGH, GRUENWALD (MRN [id number]) as of 06/14/2021 10:24  Ref. Range 06/14/2021 08:46  Sodium Latest Ref Range: 135 - 145 mmol/L 135  Potassium Latest Ref Range: 3.5 - 5.1 mmol/L 3.2 (L)  Chloride Latest Ref Range: 98 - 111 mmol/L 98  CO2 Latest Ref Range: 22 - 32 mmol/L 20 (L)  Glucose Latest Ref Range: 70 - 99 mg/dL 474 (H)  BUN Latest Ref Range: 6 - 20 mg/dL 12  Creatinine Latest Ref Range: 0.61 - 1.24 mg/dL 1.22  Calcium Latest Ref Range: 8.9 - 10.3 mg/dL 7.7 (L)  Anion gap Latest Ref Range: 5 - 15  17 (H)    Results for YENGKONG, BIBB (MRN [id number]) as of 06/14/2021 10:24  Ref. Range 06/13/2021 20:19 06/14/2021 08:31 06/14/2021 09:27 06/14/2021 09:53  Glucose-Capillary Latest Ref Range: 70 - 99 mg/dL 479 (H) 471 (H)  IV Insulin Drip started at 0757am 433 (H) 356 (H)  IV Insulin Drip   Results for LORENZ, KERSTETTER (MRN [id number]) as of 06/14/2021 10:24  Ref. Range 12/27/2020 05:21 05/28/2021 00:23  Hemoglobin A1C Latest Ref Range: 4.8 - 5.6 % 7.4 (H) 8.2 (H)    Admit with: Bowel perforation/ Diverticular disease with perforation  History: Type 1 Diabetes Needs Basal, Correction, and Meal Coverage ETOH Abuse  Home DM Meds: Levemir 15 units Daily        Regular 5-9 units per SSI  Current Orders: IV Insulin Drip    Underwent Sigmoid colon resection with colostomy (Hartmann's procedure) and Appendectomy early this AM  ENDO: Dr. Honor Junes with [facility] Last seen 01/20/2021 Was supposed to switch from NPH and Regular Insulin to Basaglar + [employer] 16 units Daily and Increase to 18-20 units of CBGs run high Start Novolog 5 units TID with meals + SSI (1 unit for every 5 Grams Carbs + 1 unit for  every 50 mg/dl >100)     MD- Note Anion Gap 17 on 8:46am BMET (CO2 was 20)--Next BMET due later this AM  Would leave pt on IV Insulin Drip until Anion Gap closer to 12 or less and CBGs under 180 for at least 4 consecutive readings  When pt is finally ready to transition to SQ Insulin, please consider: --Start Semglee 15 units Daily (make sure to continue IV Insulin Drip for at least 1 hour after 1st dose Semglee on board) --Start Novolog Sensitive Correction Scale/ SSI (0-9 units) Q4 hours    --Will follow patient during hospitalization--  Wyn Quaker RN, MSN, CDE Diabetes Coordinator Inpatient Glycemic Control Team Team Pager: [phone] (8a-5p)

## 2021-06-14 NOTE — Anesthesia Procedure Notes (Signed)
Arterial Line Insertion Start/End8/05/2021 6:21 AM Performed by: Scarlett Presto, MD, anesthesiologist  Patient location: OR. Preanesthetic checklist: patient identified, IV checked, site marked, risks and benefits discussed, surgical consent, monitors and equipment checked, pre-op evaluation, timeout performed and anesthesia consent Left, radial was placed Catheter size: 20 G Hand hygiene performed  and Seldinger technique used Allen's test indicative of satisfactory collateral circulation Attempts: 1 Procedure performed using ultrasound guided technique. Ultrasound Notes:anatomy identified, needle tip was noted to be adjacent to the nerve/plexus identified, no ultrasound evidence of intravascular and/or intraneural injection and image(s) printed for medical record Following insertion, dressing applied and Biopatch. Post procedure assessment: normal  Patient tolerated the procedure well with no immediate complications.

## 2021-06-14 NOTE — Anesthesia Procedure Notes (Signed)
Anesthesia Procedure Note 18 gauge IV placed with ultrasound guidance in AC-image in chart

## 2021-06-14 NOTE — Anesthesia Procedure Notes (Signed)
Procedure Name: Intubation Date/Time: 06/14/2021 5:58 AM Performed by: Garner Nash, CRNA Pre-anesthesia Checklist: Patient identified, Emergency Drugs available, Suction available and Patient being monitored Patient Re-evaluated:Patient Re-evaluated prior to induction Oxygen Delivery Method: Circle system utilized Preoxygenation: Pre-oxygenation with 100% oxygen Induction Type: IV induction Ventilation: Mask ventilation without difficulty Laryngoscope Size: McGraph and 4 Grade View: Grade I Tube type: Oral Tube size: 7.5 mm Number of attempts: 1 Airway Equipment and Method: Stylet and Oral airway Placement Confirmation: ETT inserted through vocal cords under direct vision, positive ETCO2 and breath sounds checked- equal and bilateral Tube secured with: Tape Dental Injury: Teeth and Oropharynx as per pre-operative assessment

## 2021-06-14 NOTE — ED Notes (Signed)
Complaining of low abdomen pain that started earlier this week, makes it hard for him to sit up.

## 2021-06-14 NOTE — Progress Notes (Signed)
Russell Gomez is a 60 year old M with type I DM, A. fib on Eliquis and alcohol use disorder presenting with several days progressive abdominal pain, not severe.  In the ER, tachycardic, lactate greater than 4, leukocytosis and tachypneic.  Hypoxic to 89% on room air.  CT abdomen and pelvis showed perforated bowel.  Taken emergently to the OR for ex lap.      Perforated sigmoid diverticulitis Status post sigmoid colectomy with Hartmann procedure and colostomy 8/7 by Dr. Peyton Najjar -Continue Zosyn   Severe sepsis due to perforated diverticulitis Presented with tachycardia, leukocytosis, tachypnea, hypoxia, and elevated lactic acid. -Continue Zosyn  Acute hypoxic respiratory failure due to sepsis Tachypneic and hypoxic in the setting of perforated diverticulitis and peritonitis. - Wean oxygen as able  Type 1 diabetes with early DKA Bicarb 20, anion gap 17 in the setting of glucoses greater than 400. - Start insulin drip - Frequent BMP - IV fluids - Likely transition to subQ insulin this afternoon  Hypertension Blood pressure initially elevated, now improving, continue home furosemide and metoprolol  Paroxysmal atrial fibrillation -Resume Eliquis when safe from surgical standpoint - Continue metoprolol  Alcohol use disorder with history of withdrawal -CIWA scoring - Thiamine and folate - On-demand Ativan  GERD - Continue home PPI  Hypokalemia - Check magnesium - Supplement potassium

## 2021-06-14 NOTE — Plan of Care (Signed)
  Problem: Education: Goal: Knowledge of General Education information will improve Description: Including pain rating scale, medication(s)/side effects and non-pharmacologic comfort measures Outcome: Progressing   Problem: Health Behavior/Discharge Planning: Goal: Ability to manage health-related needs will improve Outcome: Progressing   Problem: Clinical Measurements: Goal: Ability to maintain clinical measurements within normal limits will improve Outcome: Progressing Goal: Will remain free from infection Outcome: Progressing Goal: Diagnostic test results will improve Outcome: Progressing Goal: Respiratory complications will improve Outcome: Progressing Goal: Cardiovascular complication will be avoided Outcome: Progressing   Problem: Activity: Goal: Risk for activity intolerance will decrease Outcome: Progressing   Problem: Nutrition: Goal: Adequate nutrition will be maintained Outcome: Progressing   Problem: Coping: Goal: Level of anxiety will decrease Outcome: Progressing   Problem: Elimination: Goal: Will not experience complications related to bowel motility Outcome: Progressing Goal: Will not experience complications related to urinary retention Outcome: Progressing   Problem: Pain Managment: Goal: General experience of comfort will improve Outcome: Progressing   Problem: Safety: Goal: Ability to remain free from injury will improve Outcome: Progressing   Problem: Skin Integrity: Goal: Risk for impaired skin integrity will decrease Outcome: Progressing   Problem: Education: Goal: Required Educational Video(s) Outcome: Progressing   Problem: Clinical Measurements: Goal: Ability to maintain clinical measurements within normal limits will improve Outcome: Progressing Goal: Postoperative complications will be avoided or minimized Outcome: Progressing   Problem: Skin Integrity: Goal: Demonstration of wound healing without infection will  improve Outcome: Progressing   Problem: Education: Goal: Knowledge of ostomy care will improve Outcome: Progressing Goal: Understanding of discharge needs will improve Outcome: Progressing   Problem: Bowel/Gastric/Urinary: Goal: Gastrointestinal status for postoperative course will improve Outcome: Progressing   Problem: Coping: Goal: Coping ability will improve Outcome: Progressing   Problem: Fluid Volume: Goal: Ability to achieve a balanced intake and output will improve Outcome: Progressing   Problem: Health Behavior/Discharge Planning: Goal: Ability to manage health-related needs will improve Outcome: Progressing   Problem: Nutrition: Goal: Will attain and maintain optimal nutritional status will improve Outcome: Progressing   Problem: Clinical Measurements: Goal: Postoperative complications will be avoided or minimized Outcome: Progressing   Problem: Skin Integrity: Goal: Will show signs of wound healing Outcome: Progressing Goal: Risk for impaired skin integrity will decrease Outcome: Progressing   Problem: Education: Goal: Ability to describe self-care measures that may prevent or decrease complications (Diabetes Survival Skills Education) will improve Outcome: Progressing Goal: Individualized Educational Video(s) Outcome: Progressing   Problem: Cardiac: Goal: Ability to maintain an adequate cardiac output will improve Outcome: Progressing   Problem: Health Behavior/Discharge Planning: Goal: Ability to identify and utilize available resources and services will improve Outcome: Progressing Goal: Ability to manage health-related needs will improve Outcome: Progressing   Problem: Fluid Volume: Goal: Ability to achieve a balanced intake and output will improve Outcome: Progressing   Problem: Metabolic: Goal: Ability to maintain appropriate glucose levels will improve Outcome: Progressing   Problem: Nutritional: Goal: Maintenance of adequate nutrition  will improve Outcome: Progressing Goal: Maintenance of adequate weight for body size and type will improve Outcome: Progressing   Problem: Respiratory: Goal: Will regain and/or maintain adequate ventilation Outcome: Progressing   Problem: Urinary Elimination: Goal: Ability to achieve and maintain adequate renal perfusion and functioning will improve Outcome: Progressing

## 2021-06-14 NOTE — Transfer of Care (Signed)
Immediate Anesthesia Transfer of Care Note  Patient: Russell Gomez  Procedure(s) Performed: EXPLORATORY LAPAROTOMY COLON RESECTION SIGMOID WITH END COLOSTOMY CREATION APPENDECTOMY  Patient Location: PACU  Anesthesia Type:General  Level of Consciousness: sedated  Airway & Oxygen Therapy: Patient Spontanous Breathing and Patient connected to face mask oxygen  Post-op Assessment: Report given to RN and Post -op Vital signs reviewed and stable  Post vital signs: Reviewed and stable  Last Vitals:  Vitals Value Taken Time  BP 176/99 06/14/21 0830  Temp    Pulse 126 06/14/21 0838  Resp 27 06/14/21 0838  SpO2 98 % 06/14/21 0838  Vitals shown include unvalidated device data.  Last Pain:  Vitals:   06/14/21 0121  TempSrc: Oral  PainSc:          Complications: No notable events documented.

## 2021-06-14 NOTE — ED Provider Notes (Signed)
[facility] Emergency Department Provider Note  ____________________________________________   Event Date/Time   First MD Initiated Contact with Patient 06/14/21 [phone]     (approximate)  I have reviewed the triage vital signs and the nursing notes.   HISTORY  Chief Complaint Abdominal Pain, Nausea, and Emesis    HPI Russell Gomez is a 60 y.o. male with medical history as listed below who presents for evaluation of about 3 days of lower abdominal pain.  He said that it started right after he stopped drinking alcohol.  He was admitted to the hospital a few weeks ago for nausea, vomiting, and diarrhea, and he states this has continued.  He reports that the pain is severe and sharp tonight and nothing in particular makes it better or worse.  He denies fever and shortness of breath.  Nothing in particular makes his symptoms better or worse.  He admits to having an issue with chronic alcohol abuse but says he has not had a drink in 3 days.     Past Medical History:  Diagnosis Date   Arthritis    on operative finger   Dental crowns present    Diabetes type 1, uncontrolled ([place])    IDDM (Dr. Eddie Dibbles @ [facility])   Esophageal reflux    Goiter 04/2012   no current med, has yearly monitoring   High cholesterol    HTN (hypertension)    under control with meds, has been on med x 2 yr.    Patient Active Problem List   Diagnosis Date Noted   Bowel perforation ([place]) 06/14/2021   Malnutrition of moderate degree 05/28/2021   Lactic acid acidosis    AF (paroxysmal atrial fibrillation) ([place])    Hyperlipidemia    DKA, type 1 ([place]) 05/27/2021   Type 1 diabetes mellitus with hyperglycemia ([place]) 05/27/2021   Hypophosphatemia    Hypocalcemia 12/27/2020   Hypomagnesemia 12/27/2020   Alcohol use disorder, moderate, dependence ([place]) 12/27/2020   Acute gastroenteritis 12/27/2020   Atrial fibrillation with RVR ([place]) 12/27/2020   Elevated troponin 12/27/2020   Dizziness  12/10/2020   Irregular heart rhythm 12/10/2020   AAA (abdominal aortic aneurysm) ([place]) 05/27/2020   Transaminitis 05/22/2020   Thiamine deficiency 04/28/2020   Acute diarrhea 04/25/2020   Alcohol abuse 04/25/2020   Macrocytic anemia 04/19/2020   Abnormal EKG 04/19/2020   Proteinuria 04/19/2020   Vitamin B12 deficiency 04/19/2020   Vitamin D deficiency 04/19/2020   Folate deficiency 04/19/2020   Hyperparathyroidism ([place]) 04/19/2020   Psychosocial stressors 04/27/2019   Pedal edema 04/08/2017   Abnormal thyroid function test 02/13/2017   Hypokalemia 08/27/2015   Encounter for general adult medical examination with abnormal findings 01/23/2014   Cough 01/09/2014   Nausea vomiting and diarrhea 01/09/2014   Colloid thyroid nodule 05/05/2012   Erectile dysfunction 04/19/2012   Diabetes mellitus type 1, controlled ([place]) 10/22/2010   Hyperlipidemia due to type 1 diabetes mellitus ([place]) 10/22/2010   TOBACCO ABUSE 10/22/2010   Essential hypertension 10/22/2010   GERD 10/22/2010    Past Surgical History:  Procedure Laterality Date   DISTAL INTERPHALANGEAL JOINT FUSION Right 03/05/2014   Procedure: DEBRIDEMENT (DIP) DISTAL INTERPHALANGEAL RIGHT MIDDLE FINGER;  Surgeon: Wynonia Sours, MD;  Location: [place];  Service: Orthopedics;  Laterality: Right;   KNEE ARTHROSCOPY     MASS EXCISION Right 03/05/2014   Procedure: EXCISION CYST ;  Surgeon: Wynonia Sours, MD;  Location: [place];  Service: Orthopedics;  Laterality:  Right;  ANESTHESIA: IV REGINAL FAB   OPEN REDUCTION NASAL FRACTURE  12/26/2008   with closure of nasal lac.   ORIF DISTAL RADIUS FRACTURE Right 12/26/2008   ORIF WRIST FRACTURE Left 05/12/2016   takedown of nonunion/malunion and OPEN REDUCTION INTERNAL FIXATION (ORIF) WRIST FRACTURE;  Surgeon: Corky Mull, MD   PERCUTANEOUS PINNING Left 02/06/2013   Procedure: PINNING PIP OF THE LEFT MIDDLE FINGER ;  Surgeon: Wynonia Sours, MD;  Location: [place];  Service: Orthopedics;  Laterality: Left;   SKIN GRAFT SPLIT THICKNESS LEG / FOOT Right 1985   thigh after trauma vs machine at work   TRIGGER FINGER RELEASE  11/28/2012   Procedure: RELEASE TRIGGER FINGER/A-1 PULLEY;  Surgeon: Wynonia Sours, MD;  Laterality: Left;  EXCISION MASS LEFT RING FINGER, RELEASE A-1 PULLEY LEFT RING FINGER (ganglion cyst)    Prior to Admission medications   Medication Sig Start Date End Date Taking? Authorizing Provider  apixaban (ELIQUIS) 5 MG TABS tablet Take 1 tablet (5 mg total) by mouth 2 (two) times daily. 02/10/21  Yes Gollan, Kathlene November, MD  calcium-vitamin D (OSCAL 500/200 D-3) 500-200 MG-UNIT tablet Take 1 tablet by mouth 2 (two) times daily. 05/29/21 06/28/21  Loletha Grayer, MD  feeding supplement, GLUCERNA SHAKE, (GLUCERNA SHAKE) LIQD Take 237 mLs by mouth 3 (three) times daily between meals. 05/29/21   Loletha Grayer, MD  folic acid (FOLVITE) 1 MG tablet Take 1 tablet by mouth once daily 05/01/21   Ria Bush, MD  furosemide (LASIX) 40 MG tablet Take 1 tablet (40 mg total) by mouth daily. 01/14/21   Ria Bush, MD  glucose blood test strip Use 6 (six) times daily 01/16/18   [provider]  hydrOXYzine (ATARAX/VISTARIL) 25 MG tablet Take 0.5-1 tablets (12.5-25 mg total) by mouth 2 (two) times daily as needed for anxiety or nausea (sedation precautions). 02/25/21   Ria Bush, MD  insulin detemir (LEVEMIR) 100 UNIT/ML FlexPen Inject 15 Units into the skin daily. 05/29/21   Loletha Grayer, MD  Insulin Pen Needle 34G X 3.5 MM MISC 1 Dose by Does not apply route daily. 05/29/21   Loletha Grayer, MD  insulin regular (NOVOLIN R) 100 units/mL injection Inject 5-9 Units into the skin as directed. Sliding scale as needed.    [provider]  magnesium oxide (MAG-OX) 400 (240 Mg) MG tablet Take 1 tablet (400 mg total) by mouth daily. 05/29/21   Loletha Grayer, MD  metoprolol tartrate (LOPRESSOR) 100 MG tablet Take 1  tablet (100 mg total) by mouth 2 (two) times daily. 12/31/20   Minna Merritts, MD  Multiple Vitamin (MULTIVITAMIN WITH MINERALS) TABS tablet Take 1 tablet by mouth daily. 05/29/21   Loletha Grayer, MD  pantoprazole (PROTONIX) 40 MG tablet Take 1 tablet (40 mg total) by mouth 2 (two) times daily before a meal. 12/15/20   Ria Bush, MD  potassium chloride SA (KLOR-CON M20) 20 MEQ tablet Take 1 tablet (20 mEq total) by mouth daily. 04/30/21   Hinda Kehr, MD  pravastatin (PRAVACHOL) 80 MG tablet Take 1 tablet by mouth once daily 05/01/21   Ria Bush, MD  thiamine 100 MG tablet Take 1 tablet (100 mg total) by mouth daily. 05/29/21   Loletha Grayer, MD  traZODone (DESYREL) 50 MG tablet Take 1 tablet (50 mg total) by mouth at bedtime. 05/29/21   Loletha Grayer, MD    Allergies Insulin aspart, Spironolactone, Hydrochlorothiazide, and Morphine  Family History  Problem Relation Age of  Onset   Healthy Father    Hypertension Mother    Hyperlipidemia Mother    Diabetes Maternal Uncle    Coronary artery disease Maternal Uncle    Stroke Maternal Aunt    Diabetes Paternal Grandmother    Diabetes Paternal Uncle     Social History Social History   Tobacco Use   Smoking status: Every Day    Packs/day: 1.00    Years: 20.00    Pack years: 20.00    Types: Cigarettes   Smokeless tobacco: Never   Tobacco comments:    smokes 1 pack a day  Vaping Use   Vaping Use: Former  Substance Use Topics   Alcohol use: Yes    Alcohol/week: 7.0 standard drinks    Types: 7 Cans of beer per week    Comment: 1-2 drinks per day   Drug use: No    Review of Systems Constitutional: No fever/chills Eyes: No visual changes. ENT: No sore throat. Cardiovascular: Denies chest pain. Respiratory: Denies shortness of breath. Gastrointestinal: Lower abdominal pain as described above.  Positive for nausea, vomiting, and diarrhea. Genitourinary: Negative for dysuria. Musculoskeletal: Negative for  neck pain.  Negative for back pain. Integumentary: Negative for rash. Neurological: Negative for headaches, focal weakness or numbness.   ____________________________________________   PHYSICAL EXAM:  VITAL SIGNS: ED Triage Vitals  Enc Vitals Group     BP 06/13/21 1949 105/64     Pulse Rate 06/13/21 1949 (!) 130     Resp 06/13/21 1949 20     Temp 06/13/21 1949 99.1 F (37.3 C)     Temp Source 06/13/21 1949 Oral     SpO2 06/13/21 1949 95 %     Weight 06/13/21 1932 97.1 kg (214 lb)     Height 06/13/21 1932 1.88 m ('6\' 2"'$ )     Head Circumference --      Peak Flow --      Pain Score 06/13/21 1931 8     Pain Loc --      Pain Edu? --      Excl. in [place]? --     Constitutional: Alert and oriented.  Eyes: Conjunctivae are normal.  Head: Atraumatic. Nose: No congestion/rhinnorhea. Mouth/Throat: Poor dentition. Neck: No stridor.  No meningeal signs.   Cardiovascular: Tachycardia, regular rhythm. Good peripheral circulation. Respiratory: Normal respiratory effort.  No retractions. Gastrointestinal: Nondistended, soft with generalized tenderness throughout the abdomen and with immediate guarding. Musculoskeletal: No lower extremity tenderness nor edema. No gross deformities of extremities. Neurologic: Patient has a mild tremor in both hands consistent with alcohol withdrawal.  No asterixis. normal speech and language. No gross focal neurologic deficits are appreciated.  Skin:  Skin is warm, dry and intact. Psychiatric: Mood and affect are normal. Speech and behavior are normal.  ____________________________________________   LABS (all labs ordered are listed, but only abnormal results are displayed)  Labs Reviewed  COMPREHENSIVE METABOLIC PANEL - Abnormal; Notable for the following components:      Result Value   Sodium 134 (*)    Chloride 97 (*)    Glucose, Bld 443 (*)    BUN 5 (*)    Calcium 6.4 (*)    Total Protein 5.8 (*)    Albumin 2.4 (*)    AST 58 (*)    Alkaline  Phosphatase 200 (*)    Total Bilirubin 2.0 (*)    All other components within normal limits  CBC - Abnormal; Notable for the following components:   WBC 11.6 (*)  All other components within normal limits  BLOOD GAS, VENOUS - Abnormal; Notable for the following components:   pH, Ven 7.50 (*)    pCO2, Ven 35 (*)    Acid-Base Excess 4.3 (*)    All other components within normal limits  MAGNESIUM - Abnormal; Notable for the following components:   Magnesium 1.0 (*)    All other components within normal limits  CBG MONITORING, ED - Abnormal; Notable for the following components:   Glucose-Capillary 479 (*)    All other components within normal limits  RESP PANEL BY RT-PCR (FLU A&B, COVID) ARPGX2  LIPASE, BLOOD  URINALYSIS, COMPLETE (UACMP) WITH MICROSCOPIC  LACTIC ACID, PLASMA  LACTIC ACID, PLASMA  PROTIME-INR  APTT  TYPE AND SCREEN   ____________________________________________  EKG  ED ECG REPORT I, Hinda Kehr, the attending physician, personally viewed and interpreted this ECG.  Date: 06/14/2021 EKG Time: 3:24 AM Rate: 114 Rhythm: Sinus tachycardia with occasional PVC QRS Axis: normal Intervals: Prolonged QTC at 527 ms ST/T Wave abnormalities: Non-specific ST segment / T-wave changes, but no clear evidence of acute ischemia. Narrative Interpretation: no definitive evidence of acute ischemia; does not meet STEMI criteria.  ____________________________________________  RADIOLOGY I, Hinda Kehr, personally viewed and evaluated these images (plain radiographs) as part of my medical decision making, as well as reviewing the written report by the radiologist.  I also discussed the case by phone with Dr. Collins Scotland, the radiologist.  ED MD interpretation: Terminal ileitis with bowel perforation and free air.  Official radiology report(s): CT ABDOMEN PELVIS W CONTRAST  Result Date: 06/14/2021 CLINICAL DATA:  Nausea and vomiting EXAM: CT ABDOMEN AND PELVIS WITH CONTRAST  TECHNIQUE: Multidetector CT imaging of the abdomen and pelvis was performed using the standard protocol following bolus administration of intravenous contrast. CONTRAST:  148m OMNIPAQUE IOHEXOL 350 MG/ML SOLN COMPARISON:  None. FINDINGS: LOWER CHEST: Normal. HEPATOBILIARY: Diffuse hypoattenuation of the liver relative to the spleen suggests hepatic steatosis. No focal liver lesion or biliary dilatation. The gallbladder is normal. PANCREAS: Normal pancreas. No ductal dilatation or peripancreatic fluid collection. SPLEEN: Normal. ADRENALS/URINARY TRACT: The adrenal glands are normal. No hydronephrosis, nephroureterolithiasis or solid renal mass. The urinary bladder is normal for degree of distention STOMACH/BOWEL: There is a punctate focus free air in the ventral abdomen (series 2, image 30). There is no hiatal hernia. Normal duodenal course and caliber. Small amount of free fluid interleaved between loops of small bowel. There is hyperenhancement of the wall of the terminal ileum. Rectosigmoid diverticulosis without acute inflammation. There is intramural fat deposition within the proximal ascending colon and the descending colon. Normal appendix. VASCULAR/LYMPHATIC: There is calcific atherosclerosis of the abdominal aorta. No lymphadenopathy. REPRODUCTIVE: Normal prostate size with symmetric seminal vesicles. MUSCULOSKELETAL. No bony spinal canal stenosis or focal osseous abnormality. OTHER: None. IMPRESSION: 1. Hyperenhancement of the wall of the terminal ileum with small amount of free fluid and punctate focus of free air in the ventral abdomen, suggesting acute Crohn's disease. 2. Multifocal punctate free air within the peritoneal cavity indicating perforation at an unknown location. 3. Submucosal fat deposition within the proximal ascending colon and descending colon, consistent with chronic inflammatory bowel disease. Critical Value/emergent results were called by telephone at the time of interpretation on  06/14/2021 at 3:24 am [facility] who verbally acknowledged these results. Aortic Atherosclerosis (ICD10-I70.0). Electronically Signed   By: KUlyses JarredM.D.   On: 06/14/2021 03:24    ____________________________________________   PROCEDURES   Procedure(s) performed (including  Critical Care):  .1-3 Lead EKG Interpretation  Date/Time: 06/14/2021 2:34 AM Performed by: Hinda Kehr, MD Authorized by: Hinda Kehr, MD     Interpretation: abnormal     ECG rate:  118   ECG rate assessment: tachycardic     Rhythm: sinus tachycardia     Ectopy: none     Conduction: normal   .Critical Care  Date/Time: 06/14/2021 3:42 AM Performed by: Hinda Kehr, MD Authorized by: Hinda Kehr, MD   Critical care provider statement:    Critical care time (minutes):  60   Critical care time was exclusive of:  Separately billable procedures and treating other patients   Critical care was necessary to treat or prevent imminent or life-threatening deterioration of the following conditions: bowel perforation.   Critical care was time spent personally by me on the following activities:  Development of treatment plan with patient or surrogate, discussions with consultants, evaluation of patient's response to treatment, examination of patient, obtaining history from patient or surrogate, ordering and performing treatments and interventions, ordering and review of laboratory studies, ordering and review of radiographic studies, pulse oximetry, re-evaluation of patient's condition and review of old charts   ____________________________________________   [place] / MDM / [place] / ED COURSE  As part of my medical decision making, I reviewed the following data within the [place] notes reviewed and incorporated, Labs reviewed , EKG interpreted , Old chart reviewed, Radiograph reviewed , Discussed with admitting physician , Discussed with radiologist,  A consult was requested and obtained from this/these consultant(s) Surgery, and Notes from prior ED visits, discussed with hospitalist, discussed with pharmacist.   Differential diagnosis includes, but is not limited to, alcoholic ketoacidosis, DKA, nonspecific electrolyte or metabolic abnormality, acute intra-abdominal infection such as appendicitis or biliary colic.  The patient is on the cardiac monitor to evaluate for evidence of arrhythmia and/or significant heart rate changes.  Patient has some signs and symptoms of alcohol withdrawal although this could also mimic acute infection.  Given that he has not had a drink in 3 days and is tachycardic and tremulous I am giving a dose of Ativan 1 mg IV.  I am also giving thiamine 100 mg IV.   He does seem to have abdominal pain and tenderness and I will further evaluate with a CT scan of the abdomen pelvis with IV contrast.  Lab results are notable for multiple electrolyte abnormalities such as a calcium of 6.4 which I will replete with 1 g calcium gluconate, but this is similar to his prior hypocalcemia.  His potassium is within normal limits tonight.  Magnesium is in process.  Lipase is normal, CBC is essentially normal other than a very mild leukocytosis.  His anion gap is within normal limits which is reassuring.  LFTs are mildly elevated but in the setting of chronic alcohol abuse this is nonspecific.  For the patient's abdominal pain is getting morphine 4 mg IV and will obtain EKG because he has a history of prolonged QTC interval in addition to his multiple electrolyte abnormalities.  He is getting a liter of normal saline IV bolus.  He understands and agrees with the plan.  Clinical Course as of 06/14/21 [id number]  Sun Jun 14, 2021  [id number] I took a call from Dr. Collins Scotland with radiology.  He reports that the patient has what appears to be terminal ileitis suggestive of Crohn's disease but he also has multifocal free air in his abdomen suggestive of a  perforation.  I will call and speak by phone with Dr. Peyton Najjar with surgery. [CF]  0345 Dr. Peyton Najjar called back and asked if I could reverse the Eliquis.  I verified with Corene Cornea and pharmacy that Eppie Gibson is the preferred medication and I have ordered it.  I did the patient regarding the results and the need for emergency surgery. [CF]  [phone] Discussed case by phone with Dr. Damita Dunnings who will consult for the medical management of the patient's comorbidities. [CF]    Clinical Course User Index [CF] Hinda Kehr, MD     ____________________________________________  FINAL CLINICAL IMPRESSION(S) / ED DIAGNOSES  Final diagnoses:  Bowel perforation (HCC)  Nausea vomiting and diarrhea  Generalized abdominal pain  Alcohol abuse  Hypomagnesemia  Elevated LFTs     MEDICATIONS GIVEN DURING THIS VISIT:  Medications  sodium chloride 0.9 % bolus 1,000 mL (0 mLs Intravenous Stopped 06/14/21 0221)    And  0.9 %  sodium chloride infusion ( Intravenous New Bag/Given 06/14/21 0225)  thiamine (B-1) injection 100 mg (has no administration in time range)  magnesium sulfate IVPB 2 g 50 mL (has no administration in time range)  lactated ringers bolus 1,000 mL (has no administration in time range)  calcium gluconate 1 g/ 50 mL sodium chloride IVPB (has no administration in time range)  prothrombin complex conc human (KCENTRA) IVPB 5,000 Units (has no administration in time range)  furosemide (LASIX) tablet 40 mg (has no administration in time range)  metoprolol tartrate (LOPRESSOR) tablet 100 mg (has no administration in time range)  hydrOXYzine (ATARAX/VISTARIL) tablet 12.5-25 mg (has no administration in time range)  traZODone (DESYREL) tablet 50 mg (has no administration in time range)  folic acid (FOLVITE) tablet 1 mg (has no administration in time range)  piperacillin-tazobactam (ZOSYN) IVPB 3.375 g (3.375 g Intravenous New Bag/Given 06/14/21 0429)  acetaminophen (TYLENOL) tablet 650 mg (has no  administration in time range)    Or  acetaminophen (TYLENOL) suppository 650 mg (has no administration in time range)  HYDROcodone-acetaminophen (NORCO/VICODIN) 5-325 MG per tablet 1-2 tablet (has no administration in time range)  morphine 4 MG/ML injection 4 mg (has no administration in time range)  ondansetron (ZOFRAN-ODT) disintegrating tablet 4 mg (has no administration in time range)    Or  ondansetron (ZOFRAN) injection 4 mg (has no administration in time range)  pantoprazole (PROTONIX) injection 40 mg (has no administration in time range)  ondansetron (ZOFRAN) injection 4 mg (4 mg Intravenous Given 06/13/21 2016)  LORazepam (ATIVAN) injection 1 mg (1 mg Intravenous Given 06/14/21 0427)  morphine 4 MG/ML injection 4 mg (4 mg Intravenous Not Given 06/14/21 0427)  iohexol (OMNIPAQUE) 350 MG/ML injection 100 mL (100 mLs Intravenous Contrast Given 06/14/21 0256)     ED Discharge Orders     None        Note:  This document was prepared using Dragon voice recognition software and may include unintentional dictation errors.   Hinda Kehr, MD 06/14/21 [phone]

## 2021-06-14 NOTE — Anesthesia Preprocedure Evaluation (Addendum)
Anesthesia Evaluation  Patient identified by MRN, date of birth, ID band Patient awake    Reviewed: Allergy & Precautions, NPO status , Patient's Chart, lab work & pertinent test resultsPreop documentation limited or incomplete due to emergent nature of procedure.  Airway Mallampati: III  TM Distance: <3 FB Neck ROM: Full    Dental  (+) Missing, Poor Dentition, Chipped, Dental Advisory Given Missing several upper and lower teeth:   Pulmonary Current Smoker and Patient abstained from smoking.,    Pulmonary exam normal        Cardiovascular Exercise Tolerance: Poor hypertension, Pt. on medications and Pt. on home beta blockers Normal cardiovascular exam(-) dysrhythmias (ON ELIQUIS) Atrial Fibrillation      Neuro/Psych PSYCHIATRIC DISORDERS Anxiety Depression negative neurological ROS     GI/Hepatic GERD  Medicated and Controlled,(+) Cirrhosis       ,   Endo/Other  diabetes, Poorly Controlled, Type 1, Insulin Dependent  Renal/GU negative Renal ROS  negative genitourinary   Musculoskeletal  (+) Arthritis ,   Abdominal   Peds  Hematology negative hematology ROS (+) anemia ,   Anesthesia Other Findings .echo 2/22 Left ventricular ejection fraction, by estimation, is 60 to 65%. The  left ventricle has normal function. The left ventricle has no regional  wall motion abnormalities. Left ventricular diastolic parameters were  normal.  2. Right ventricular systolic function is normal. The right ventricular   CHRONIC ETOH size is normal.  3. Left atrial size was mildly dilated.  4. The mitral valve is normal in structure. Mild to moderate mitral valve  regurgitation.  5. The aortic valve is normal in structure. Aortic valve regurgitation is  not visualized.  \  afib-ON ELIQUIS-TO RECEIVE REVERSAL PRIOR TO or AND HAVE PRBCS AND FFP READY FOR SURGERY  CALCIUM AND MAGNESIUM EXTREMELY LOW  CHRONIC ETOH    Reproductive/Obstetrics negative OB ROS                            Anesthesia Physical Anesthesia Plan  ASA: 4 and emergent  Anesthesia Plan: General   Post-op Pain Management:    Induction:   PONV Risk Score and Plan:   Airway Management Planned: Oral ETT  Additional Equipment: Arterial line  Intra-op Plan:   Post-operative Plan: Possible Post-op intubation/ventilation and Extubation in OR  Informed Consent: I have reviewed the patients History and Physical, chart, labs and discussed the procedure including the risks, benefits and alternatives for the proposed anesthesia with the patient or authorized representative who has indicated his/her understanding and acceptance.       Plan Discussed with: CRNA  Anesthesia Plan Comments: (POSSIBLE a LINE, POSSIBLE CENTRAL LINE  RSI)        Anesthesia Quick Evaluation

## 2021-06-15 ENCOUNTER — Encounter: Payer: Self-pay | Admitting: General Surgery

## 2021-06-15 LAB — BASIC METABOLIC PANEL
Anion gap: 4 — ABNORMAL LOW (ref 5–15)
BUN: 18 mg/dL (ref 6–20)
CO2: 30 mmol/L (ref 22–32)
Calcium: 6.5 mg/dL — ABNORMAL LOW (ref 8.9–10.3)
Chloride: 105 mmol/L (ref 98–111)
Creatinine, Ser: 0.97 mg/dL (ref 0.61–1.24)
GFR, Estimated: >60 mL/min (ref >60)
Glucose, Bld: 101 mg/dL — ABNORMAL HIGH (ref 70–99)
Potassium: 3.1 mmol/L — ABNORMAL LOW (ref 3.5–5.1)
Sodium: 139 mmol/L (ref 135–145)

## 2021-06-15 LAB — MAGNESIUM: Magnesium: 1.6 mg/dL — ABNORMAL LOW (ref 1.7–2.4)

## 2021-06-15 LAB — GLUCOSE, CAPILLARY
Glucose-Capillary: 107 mg/dL — ABNORMAL HIGH (ref 70–99)
Glucose-Capillary: 115 mg/dL — ABNORMAL HIGH (ref 70–99)
Glucose-Capillary: 164 mg/dL — ABNORMAL HIGH (ref 70–99)
Glucose-Capillary: 173 mg/dL — ABNORMAL HIGH (ref 70–99)
Glucose-Capillary: 175 mg/dL — ABNORMAL HIGH (ref 70–99)
Glucose-Capillary: 191 mg/dL — ABNORMAL HIGH (ref 70–99)
Glucose-Capillary: 242 mg/dL — ABNORMAL HIGH (ref 70–99)

## 2021-06-15 LAB — LACTIC ACID, PLASMA: Lactic Acid, Venous: 1 mmol/L (ref 0.5–1.9)

## 2021-06-15 MED ORDER — CALCIUM GLUCONATE-NACL 2-0.675 GM/100ML-% IV SOLN
2.0000 g | Freq: Once | INTRAVENOUS | Status: AC
  Administered 2021-06-15: 2000 mg via INTRAVENOUS
  Filled 2021-06-15: qty 100

## 2021-06-15 MED ORDER — POTASSIUM CHLORIDE 10 MEQ/100ML IV SOLN
10.0000 meq | INTRAVENOUS | Status: AC
  Administered 2021-06-15 (×6): 10 meq via INTRAVENOUS
  Filled 2021-06-15 (×6): qty 100

## 2021-06-15 MED ORDER — BOOST / RESOURCE BREEZE PO LIQD CUSTOM: 1.0000 | Freq: Three times a day (TID) | ORAL | Status: DC

## 2021-06-15 MED ORDER — MAGNESIUM SULFATE 2 GM/50ML IV SOLN
2.0000 g | Freq: Once | INTRAVENOUS | Status: AC
  Administered 2021-06-15: 2 g via INTRAVENOUS
  Filled 2021-06-15: qty 50

## 2021-06-15 MED ORDER — NEPRO/CARBSTEADY PO LIQD
237.0000 mL | Freq: Three times a day (TID) | ORAL | Status: DC
Start: 2021-06-15 — End: 2021-06-16

## 2021-06-15 MED ORDER — ENOXAPARIN SODIUM 100 MG/ML IJ SOSY
1.0000 mg/kg | PREFILLED_SYRINGE | Freq: Two times a day (BID) | INTRAMUSCULAR | Status: DC
  Administered 2021-06-15 – 2021-06-20 (×10): 97.5 mg via SUBCUTANEOUS
  Filled 2021-06-15 (×11): qty 0.97

## 2021-06-15 NOTE — Consult Note (Addendum)
[place] Nurse ostomy consult note Stoma type/location: LLQ, colostomy Stomal assessment/size: 1 3/4" round, budded  Peristomal assessment: NA Treatment options for stomal/peristomal skin:NA  Output none Ostomy pouching: 2pc. 2 3/4" in place from OR Education provided: reviewed basics on ostomy creation; lives alone no family support Left educational materials in the room  (3) 2 3/4" pouching systems and 1 barrier ring in the room.  PAR stock did not have barrier rings Enrolled patient in [place] program: Yes, verified information.   [place] Nurse will follow along with you for continued support with ostomy teaching and care [place] MSN, RN, [place], [place], [place]

## 2021-06-15 NOTE — Progress Notes (Signed)
[facility] PROGRESS NOTE    Russell Gomez  [id number] DOB: Apr 16, 1961 DOA: 06/14/2021 PCP: Ria Bush, MD      Brief Narrative:  Mr. Hudelson is a 60 year old M with type I DM, A. fib on Eliquis and alcohol use disorder presenting with several days progressive abdominal pain, not severe.  In the ER, tachycardic, lactate greater than 4, leukocytosis and tachypneic.  Hypoxic to 89% on room air.  CT abdomen and pelvis showed perforated bowel.  Taken emergently to the OR for ex lap.          Assessment & Plan:  Severe sepsis due to perforated diverticulitis Presented with abdominal pain, CT showed perforated sigmoid diverticulitis.  Also had tachycardia, leukocytosis, tachypnea and lung failure as well as elevated lactic acid at the time of presentation.  Heart rate remains A. fib in the 100s, blood pressure soft, mentation good.   -Continue Zosyn    Perforated sigmoid diverticulitis Status post sigmoid colectomy with Hartmann procedure and colostomy 8/7 by Dr. Peyton Najjar  -Postop care by Dr. Peyton Najjar - Continue Zosyn      Acute hypoxic respiratory failure due to sepsis Tachypneic and hypoxic in the setting of perforated diverticulitis and peritonitis. - Wean oxygen as able  Type 1 diabetes with hyperglycemia Early DKA, now resolved Hyperglycemic and with worsening acidosis..  Started on an insulin drip during the day yesterday, Resolved.  Glucoses controlled on subcu insulin today - Continue glargine 15 daily - Continue sliding scale corrections    Hypertension Blood pressure soft - Hold furosemide - Continue metoprolol  Paroxysmal atrial fibrillation - Hold Eliquis until restarted by surgery - Continue metoprolol  Alcohol use disorder with history of withdrawal No evidence of withdrawals -CIWA scoring - Continue thiamine and folate - Ativan as needed for withdrawal symptom   GERD - Continue home PPI    Hypokalemia Hypocalcemia Hypomagnesemia -Supplement potassium, magnesium, and calcium   Insomnia - Continue trazodone at night       Disposition: Status is: Inpatient  Remains inpatient appropriate because:IV treatments appropriate due to intensity of illness or inability to take PO  Dispo: The patient is from: Home              Anticipated d/c is to: Home              Patient currently is not medically stable to d/c.   Difficult to place patient No       Level of care: Stepdown       MDM: The below labs and imaging reports were reviewed and summarized above.  Medication management as above.  This is an acute illness was severe with threat to life  DVT prophylaxis: SCDs Start: 06/14/21 0421  Code Status: Full code Family Communication:      Procedures:  8/7 CT abdomen and pelvis with contrast-perforated bowel with peritonitis.  The suspicion for Crohn's disease was ruled out on exploratory laparotomy 8/7 sigmoid colectomy with colostomy  Antimicrobials:  Zosyn 8/7>>  Culture data:  8/8 blood culture x2          Subjective: Patient has a lot of abdominal pain, generalized malaise.  No fever, confusion.  No vomiting.  He has a little bit of output in his ostomy.  No blood from the ostomy.  No delirium or seizures.  Objective: Vitals:   06/15/21 0900 06/15/21 1000 06/15/21 1030 06/15/21 1100  BP: 97/64 107/70  103/72  Pulse: (!) 125 (!) 113 96 93  Resp:  $'20 17 16 13  'w$ Temp:      TempSrc:      SpO2: 97% 96% 98% 98%  Weight:      Height:        Intake/Output Summary (Last 24 hours) at 06/15/2021 1123 Last data filed at 06/15/2021 1000 Gross per 24 hour  Intake 2314.2 ml  Output 1300 ml  Net 1014.2 ml   Filed Weights   06/13/21 1932  Weight: 97.1 kg    Examination: General appearance:  adult male, alert and in no acute distress. Appears ill and tired HEENT: Anicteric, conjunctiva pink, lids and lashes normal. No nasal deformity,  discharge, epistaxis.  Lips dry and cracked, oropharynx dry, no oral lesions, hearing normal.   Skin: Warm and dry.  no jaundice.  No suspicious rashes or lesions. Cardiac: Tachycardic, regular, nl S1-S2, no murmurs appreciated.  Capillary refill is brisk.  No JVD, no LE edema.  Radial  pulses 2+ and symmetric. Respiratory: Normal respiratory rate and rhythm.  CTAB without rales or wheezes. Abdomen: Abdomen diffusely to palpation, voluntary guarding noted, no rebound or rigidity. No ascites, distension, hepatosplenomegaly.  Ostomy appears clean well-healing MSK: No deformities or effusions. Neuro: Awake and alert.  EOMI, moves all extremities with generalized weakness but symmetric strength. Speech fluent.    Psych: Sensorium intact and responding to questions, attention normal. Affect blunted.  Judgment and insight appear normal.    Data Reviewed: I have personally reviewed following labs and imaging studies:  CBC: Recent Labs  Lab 06/13/21 2005 06/14/21 0600 06/14/21 0846  WBC 11.6* 11.5* 12.6*  HGB 14.5 11.1* 11.9*  HCT 41.7 31.7* 35.0*  MCV 97.9 99.4 100.3*  PLT 369 251 [id number]   Basic Metabolic Panel: Recent Labs  Lab 06/13/21 2005 06/14/21 0600 06/14/21 0846 06/14/21 1720 06/15/21 0456  NA 134* 133* 135 138 139  K 3.5 3.8 3.2* 3.3* 3.1*  CL 97* 93* 98 102 105  CO2 22 24 20* 31 30  GLUCOSE 443* 510* 474* 142* 101*  BUN 5* '10 12 15 18  '$ CREATININE 0.80 0.98 1.22 1.06 0.97  CALCIUM 6.4* 6.4* 7.7* 7.1* 6.5*  MG 1.0* 0.9* 1.5* 1.8 1.6*  PHOS  --  4.4 5.4* 3.8  --    GFR: Estimated Creatinine Clearance: 96.5 mL/min (by C-G formula based on SCr of 0.97 mg/dL). Liver Function Tests: Recent Labs  Lab 06/13/21 2005 06/14/21 0846  AST 58* 54*  ALT 32 23  ALKPHOS 200* 153*  BILITOT 2.0* 1.9*  PROT 5.8* 4.8*  ALBUMIN 2.4* 1.8*   Recent Labs  Lab 06/13/21 2005  LIPASE 18   No results for input(s): AMMONIA in the last 168 hours. Coagulation Profile: Recent Labs   Lab 06/14/21 0348 06/14/21 0846  INR 1.3* 1.2   Cardiac Enzymes: No results for input(s): CKTOTAL, CKMB, CKMBINDEX, TROPONINI in the last 168 hours. BNP (last 3 results) No results for input(s): PROBNP in the last 8760 hours. HbA1C: No results for input(s): HGBA1C in the last 72 hours. CBG: Recent Labs  Lab 06/14/21 1910 06/15/21 0010 06/15/21 0414 06/15/21 0740 06/15/21 1119  GLUCAP 111* 242* 107* 115* 173*   Lipid Profile: No results for input(s): CHOL, HDL, LDLCALC, TRIG, CHOLHDL, LDLDIRECT in the last 72 hours. Thyroid Function Tests: No results for input(s): TSH, T4TOTAL, FREET4, T3FREE, THYROIDAB in the last 72 hours. Anemia Panel: No results for input(s): VITAMINB12, FOLATE, FERRITIN, TIBC, IRON, RETICCTPCT in the last 72 hours. Urine analysis:    Component Value Date/Time  COLORURINE AMBER (A) 06/14/2021 0348   APPEARANCEUR HAZY (A) 06/14/2021 0348   LABSPEC 1.030 06/14/2021 0348   PHURINE 5.0 06/14/2021 0348   GLUCOSEU >=500 (A) 06/14/2021 0348   HGBUR NEGATIVE 06/14/2021 0348   BILIRUBINUR NEGATIVE 06/14/2021 0348   BILIRUBINUR 1+ 04/16/2020 1242   KETONESUR 5 (A) 06/14/2021 0348   PROTEINUR NEGATIVE 06/14/2021 0348   UROBILINOGEN 1.0 04/16/2020 1242   UROBILINOGEN 0.2 01/14/2010 0100   NITRITE NEGATIVE 06/14/2021 0348   LEUKOCYTESUR NEGATIVE 06/14/2021 0348   Sepsis Labs: '@LABRCNTIP'$ (procalcitonin:4,lacticacidven:4)  ) Recent Results (from the past 240 hour(s))  Resp Panel by RT-PCR (Flu A&B, Covid) Nasopharyngeal Swab     Status: None   Collection Time: 06/14/21  3:48 AM   Specimen: Nasopharyngeal Swab; Nasopharyngeal(NP) swabs in vial transport medium  Result Value Ref Range Status   SARS Coronavirus 2 by RT PCR NEGATIVE NEGATIVE Final    Comment: (NOTE) SARS-CoV-2 target nucleic acids are NOT DETECTED.  The SARS-CoV-2 RNA is generally detectable in upper respiratory specimens during the acute phase of infection. The lowest concentration of  SARS-CoV-2 viral copies this assay can detect is 138 copies/mL. A negative result does not preclude SARS-Cov-2 infection and should not be used as the sole basis for treatment or other patient management decisions. A negative result may occur with  improper specimen collection/handling, submission of specimen other than nasopharyngeal swab, presence of viral mutation(s) within the areas targeted by this assay, and inadequate number of viral copies(<138 copies/mL). A negative result must be combined with clinical observations, patient history, and epidemiological information. The expected result is Negative.  Fact Sheet for Patients:  EntrepreneurPulse.com.au  Fact Sheet for Healthcare Providers:  IncredibleEmployment.be  This test is no t yet approved or cleared by the [place] FDA and  has been authorized for detection and/or diagnosis of SARS-CoV-2 by FDA under an Emergency Use Authorization (EUA). This EUA will remain  in effect (meaning this test can be used) for the duration of the COVID-19 declaration under Section 564(b)(1) of the Act, 21 U.S.C.section 360bbb-3(b)(1), unless the authorization is terminated  or revoked sooner.       Influenza A by PCR NEGATIVE NEGATIVE Final   Influenza B by PCR NEGATIVE NEGATIVE Final    Comment: (NOTE) The Xpert Xpress SARS-CoV-2/FLU/RSV plus assay is intended as an aid in the diagnosis of influenza from Nasopharyngeal swab specimens and should not be used as a sole basis for treatment. Nasal washings and aspirates are unacceptable for Xpert Xpress SARS-CoV-2/FLU/RSV testing.  Fact Sheet for Patients: EntrepreneurPulse.com.au  Fact Sheet for Healthcare Providers: IncredibleEmployment.be  This test is not yet approved or cleared by the [place] FDA and has been authorized for detection and/or diagnosis of SARS-CoV-2 by FDA under an Emergency Use  Authorization (EUA). This EUA will remain in effect (meaning this test can be used) for the duration of the COVID-19 declaration under Section 564(b)(1) of the Act, 21 U.S.C. section 360bbb-3(b)(1), unless the authorization is terminated or revoked.  Performed at [facility], [address] [place], [place] [address]   MRSA Next Gen by PCR, Nasal     Status: None   Collection Time: 06/14/21 10:10 AM   Specimen: Nasal Mucosa; Nasal Swab  Result Value Ref Range Status   MRSA by PCR Next Gen NOT DETECTED NOT DETECTED Final    Comment: (NOTE) The GeneXpert MRSA Assay (FDA approved for NASAL specimens only), is one component of a comprehensive MRSA colonization surveillance program. It is not intended  to diagnose MRSA infection nor to guide or monitor treatment for MRSA infections. Test performance is not FDA approved in patients less than 30 years old. Performed at [facility], [address] [place], [place] [address]          Radiology Studies: CT ABDOMEN PELVIS W CONTRAST  Result Date: 06/14/2021 CLINICAL DATA:  Nausea and vomiting EXAM: CT ABDOMEN AND PELVIS WITH CONTRAST TECHNIQUE: Multidetector CT imaging of the abdomen and pelvis was performed using the standard protocol following bolus administration of intravenous contrast. CONTRAST:  157m OMNIPAQUE IOHEXOL 350 MG/ML SOLN COMPARISON:  None. FINDINGS: LOWER CHEST: Normal. HEPATOBILIARY: Diffuse hypoattenuation of the liver relative to the spleen suggests hepatic steatosis. No focal liver lesion or biliary dilatation. The gallbladder is normal. PANCREAS: Normal pancreas. No ductal dilatation or peripancreatic fluid collection. SPLEEN: Normal. ADRENALS/URINARY TRACT: The adrenal glands are normal. No hydronephrosis, nephroureterolithiasis or solid renal mass. The urinary bladder is normal for degree of distention STOMACH/BOWEL: There is a punctate focus free air in the ventral abdomen (series 2, image 30).  There is no hiatal hernia. Normal duodenal course and caliber. Small amount of free fluid interleaved between loops of small bowel. There is hyperenhancement of the wall of the terminal ileum. Rectosigmoid diverticulosis without acute inflammation. There is intramural fat deposition within the proximal ascending colon and the descending colon. Normal appendix. VASCULAR/LYMPHATIC: There is calcific atherosclerosis of the abdominal aorta. No lymphadenopathy. REPRODUCTIVE: Normal prostate size with symmetric seminal vesicles. MUSCULOSKELETAL. No bony spinal canal stenosis or focal osseous abnormality. OTHER: None. IMPRESSION: 1. Hyperenhancement of the wall of the terminal ileum with small amount of free fluid and punctate focus of free air in the ventral abdomen, suggesting acute Crohn's disease. 2. Multifocal punctate free air within the peritoneal cavity indicating perforation at an unknown location. 3. Submucosal fat deposition within the proximal ascending colon and descending colon, consistent with chronic inflammatory bowel disease. Critical Value/emergent results were called by telephone at the time of interpretation on 06/14/2021 at 3:24 am to [facility], who verbally acknowledged these results. Aortic Atherosclerosis (ICD10-I70.0). Electronically Signed   By: KUlyses JarredM.D.   On: 06/14/2021 03:24        Scheduled Meds:  Chlorhexidine Gluconate Cloth  6 each Topical Daily   folic acid  1 mg Oral Daily   insulin aspart  0-15 Units Subcutaneous Q4H   insulin glargine-yfgn  15 Units Subcutaneous QHS   metoprolol tartrate  100 mg Oral BID   multivitamin with minerals  1 tablet Oral Daily   nicotine  21 mg Transdermal Daily   pantoprazole (PROTONIX) IV  40 mg Intravenous QHS   thiamine  100 mg Oral Daily   Or   thiamine  100 mg Intravenous Daily   traZODone  50 mg Oral QHS   Continuous Infusions:  acetaminophen Stopped (06/15/21 0112)   piperacillin-tazobactam (ZOSYN)  IV Stopped  (06/15/21 0932)   potassium chloride 10 mEq (06/15/21 1120)     LOS: 1 day    Time spent: 35 minutes    CEdwin Dada MD [facility] 06/15/2021, 11:23 AM     Please page though [facility] [employer] secure chat:  For [employer] cAdult nurse

## 2021-06-15 NOTE — Plan of Care (Signed)
  Problem: Education: Goal: Knowledge of General Education information will improve Description: Including pain rating scale, medication(s)/side effects and non-pharmacologic comfort measures Outcome: Progressing   Problem: Health Behavior/Discharge Planning: Goal: Ability to manage health-related needs will improve Outcome: Progressing   Problem: Clinical Measurements: Goal: Ability to maintain clinical measurements within normal limits will improve Outcome: Progressing Goal: Will remain free from infection Outcome: Progressing Goal: Diagnostic test results will improve Outcome: Progressing Goal: Respiratory complications will improve Outcome: Progressing Goal: Cardiovascular complication will be avoided Outcome: Progressing   Problem: Activity: Goal: Risk for activity intolerance will decrease Outcome: Progressing   Problem: Nutrition: Goal: Adequate nutrition will be maintained Outcome: Progressing   Problem: Coping: Goal: Level of anxiety will decrease Outcome: Progressing   Problem: Elimination: Goal: Will not experience complications related to bowel motility Outcome: Progressing Goal: Will not experience complications related to urinary retention Outcome: Progressing   Problem: Pain Managment: Goal: General experience of comfort will improve Outcome: Progressing   Problem: Safety: Goal: Ability to remain free from injury will improve Outcome: Progressing   Problem: Skin Integrity: Goal: Risk for impaired skin integrity will decrease Outcome: Progressing   Problem: Education: Goal: Required Educational Video(s) Outcome: Progressing   Problem: Clinical Measurements: Goal: Ability to maintain clinical measurements within normal limits will improve Outcome: Progressing Goal: Postoperative complications will be avoided or minimized Outcome: Progressing   Problem: Skin Integrity: Goal: Demonstration of wound healing without infection will  improve Outcome: Progressing   Problem: Education: Goal: Knowledge of ostomy care will improve Outcome: Progressing Goal: Understanding of discharge needs will improve Outcome: Progressing   Problem: Bowel/Gastric/Urinary: Goal: Gastrointestinal status for postoperative course will improve Outcome: Progressing   Problem: Coping: Goal: Coping ability will improve Outcome: Progressing   Problem: Fluid Volume: Goal: Ability to achieve a balanced intake and output will improve Outcome: Progressing   Problem: Health Behavior/Discharge Planning: Goal: Ability to manage health-related needs will improve Outcome: Progressing   Problem: Nutrition: Goal: Will attain and maintain optimal nutritional status will improve Outcome: Progressing   Problem: Clinical Measurements: Goal: Postoperative complications will be avoided or minimized Outcome: Progressing   Problem: Skin Integrity: Goal: Will show signs of wound healing Outcome: Progressing Goal: Risk for impaired skin integrity will decrease Outcome: Progressing   Problem: Education: Goal: Ability to describe self-care measures that may prevent or decrease complications (Diabetes Survival Skills Education) will improve Outcome: Progressing Goal: Individualized Educational Video(s) Outcome: Progressing   Problem: Cardiac: Goal: Ability to maintain an adequate cardiac output will improve Outcome: Progressing   Problem: Health Behavior/Discharge Planning: Goal: Ability to identify and utilize available resources and services will improve Outcome: Progressing Goal: Ability to manage health-related needs will improve Outcome: Progressing   Problem: Fluid Volume: Goal: Ability to achieve a balanced intake and output will improve Outcome: Progressing   Problem: Metabolic: Goal: Ability to maintain appropriate glucose levels will improve Outcome: Progressing   Problem: Nutritional: Goal: Maintenance of adequate nutrition  will improve Outcome: Progressing Goal: Maintenance of adequate weight for body size and type will improve Outcome: Progressing   Problem: Respiratory: Goal: Will regain and/or maintain adequate ventilation Outcome: Progressing   Problem: Urinary Elimination: Goal: Ability to achieve and maintain adequate renal perfusion and functioning will improve Outcome: Progressing

## 2021-06-15 NOTE — Progress Notes (Addendum)
Initial Nutrition Assessment  DOCUMENTATION CODES:   Not applicable  INTERVENTION:   Nepro Shake po TID with diet advancement, each supplement provides 425 kcal and 19 grams protein  MVI, thiamine and folic acid po daily   Pt at high refeed risk; recommend monitor potassium, magnesium and phosphorus labs daily until stable  NUTRITION DIAGNOSIS:   Inadequate oral intake related to acute illness as evidenced by other (comment) (pt on clear liquid diet).  GOAL:   Patient will meet greater than or equal to 90% of their needs  MONITOR:   PO intake, Supplement acceptance, Diet advancement, Labs, Weight trends, Skin, I & O's  REASON FOR ASSESSMENT:   Malnutrition Screening Tool    ASSESSMENT:   60 y/o male with h/o type 1 DM (diagnosed age 25y), HTN, etoh abuse, Afib, GERD and AAA who is admitted with severe sepsis secondary to perforated diverticulitis now s/p Hartmann's 8/7  Met with pt in room today. Pt reports good appetite and oral intake at baseline but reports poor oral intake for several days pta r/t abdominal pain and nausea. Pt does report that at times, he chooses to drink etoh and not eat. Pt had broth, jello and italian ice for breakfast this morning. Pt denies any nausea or abdominal pain other than some soreness around his incision. Pt has now been advanced to a full liquid diet. Pt reports that he is not yet passing flatus but he is having some loose stools. RD dicussed with pt the importance of adequate nutrition needed to preserve lean muscle and support post op healing. Pt is willing to drink vanilla supplements in hospital; RD will order. Pt is likely at refeed risk. Per chart, pt appears to be down 26lbs(11%) from July 2021- December 2021. Pt appears to be weight stable since December. Pt reports an 80lb weight loss several years ago after quitting alcohol one time but reports that now his weight remains stable around 214lbs.   Medications reviewed and include:  lovenox, folic acid, insulin, MVI, protonix, thiamine, zosyn  Labs reviewed: K 3.1(L), Ca 6.5(L), Mg 1.6(L) P 3.8 wnl- 8/7 Wbc- 12.6(H) Cbgs- 242, 107, 115, 173 x 24 hrs AIC 8.2(H)- 7/21  NUTRITION - FOCUSED PHYSICAL EXAM:  Flowsheet Row Most Recent Value  Orbital Region No depletion  Upper Arm Region No depletion  Thoracic and Lumbar Region No depletion  Buccal Region No depletion  Temple Region No depletion  Clavicle Bone Region Mild depletion  Clavicle and Acromion Bone Region Mild depletion  Scapular Bone Region No depletion  Dorsal Hand Mild depletion  Patellar Region Mild depletion  Anterior Thigh Region Mild depletion  Posterior Calf Region Mild depletion  Edema (RD Assessment) None  Hair Reviewed  Eyes Reviewed  Mouth Reviewed  Skin Reviewed  Nails Reviewed   Diet Order:   Diet Order             Diet full liquid Room service appropriate? Yes; Fluid consistency: Thin  Diet effective now                  EDUCATION NEEDS:   Education needs have been addressed  Skin:  Skin Assessment: Reviewed RN Assessment (incision abdomen)  Last BM:  8/8- type 7- 279m via ostomy  Height:   Ht Readings from Last 1 Encounters:  06/13/21 6' 2" (1.88 m)    Weight:   Wt Readings from Last 1 Encounters:  06/13/21 97.1 kg    Ideal Body Weight:  86.3 kg  BMI:  Body mass index is 27.48 kg/m.  Estimated Nutritional Needs:   Kcal:  2300-2600kcal/day  Protein:  115-130g/day  Fluid:  2.6-2.9L/day  Koleen Distance MS, RD, LDN Please refer to [facility] for RD and/or RD on-call/weekend/after hours pager

## 2021-06-15 NOTE — Progress Notes (Signed)
Patient ID: LINTON LORTZ, male   DOB: [birth year], 60 y.o.   MRN: [id number]     [facility] Day(s): 1.   Interval History: Patient seen and examined, no acute events or new complaints overnight. Patient reports feeling better this morning.  He reported the pain is getting better.  He denies any nausea or vomiting.  He reports that the colostomy is working.  Vital signs in last 24 hours: [min-max] current  Temp:  [97.3 F (36.3 C)-98.2 F (36.8 C)] 97.6 F (36.4 C) (08/08 0800) Pulse Rate:  [93-125] 93 (08/08 1100) Resp:  [12-21] 13 (08/08 1100) BP: (90-122)/(51-79) 103/72 (08/08 1100) SpO2:  [95 %-100 %] 98 % (08/08 1100) Arterial Line BP: (76-131)/(38-59) 126/59 (08/08 1100)     Height: '6\' 2"'$  (188 cm) Weight: 97.1 kg BMI (Calculated): 27.46   Physical Exam:  Constitutional: alert, cooperative and no distress  Respiratory: breathing non-labored at rest  Cardiovascular: regular rate and sinus rhythm  Gastrointestinal: soft, non-tender, and non-distended.  Colostomy pink and patent  Labs:  CBC Latest Ref Rng & Units 06/14/2021 06/14/2021 06/13/2021  WBC 4.0 - 10.5 K/uL 12.6(H) 11.5(H) 11.6(H)  Hemoglobin 13.0 - 17.0 g/dL 11.9(L) 11.1(L) 14.5  Hematocrit 39.0 - 52.0 % 35.0(L) 31.7(L) 41.7  Platelets 150 - 400 K/uL 292 251 369   CMP Latest Ref Rng & Units 06/15/2021 06/14/2021 06/14/2021  Glucose 70 - 99 mg/dL 101(H) 142(H) 474(H)  BUN 6 - 20 mg/dL '18 15 12  '$ Creatinine 0.61 - 1.24 mg/dL 0.97 1.06 1.22  Sodium 135 - 145 mmol/L 139 138 135  Potassium 3.5 - 5.1 mmol/L 3.1(L) 3.3(L) 3.2(L)  Chloride 98 - 111 mmol/L 105 102 98  CO2 22 - 32 mmol/L 30 31 20(L)  Calcium 8.9 - 10.3 mg/dL 6.5(L) 7.1(L) 7.7(L)  Total Protein 6.5 - 8.1 g/dL - - 4.8(L)  Total Bilirubin 0.3 - 1.2 mg/dL - - 1.9(H)  Alkaline Phos 38 - 126 U/L - - 153(H)  AST 15 - 41 U/L - - 54(H)  ALT 0 - 44 U/L - - 23    Imaging studies: No new pertinent imaging studies   Assessment/Plan:  60 y.o. male with  diverticulitis with perforation 1 Day Post-Op s/p Hartman's procedure, complicated by pertinent comorbidities including type 1 diabetes, A. fib and on anticoagulation, chronic alcohol abuse.  Patient recovering slowly.  No sign of deterioration.  He has adequate bowel function.  I will start clear liquid diet and advance as tolerated.  Appreciate hospitalist recommendation and management of medical comorbidities.  Patient can get out of bed to chair.  We will continue with IV antibiotic therapy due to severe intraperitoneal infection identify on exploratory laparotomy.  We will start therapeutic Lovenox before restarting long-acting Eliquis.  Arnold Long, MD

## 2021-06-15 NOTE — Consult Note (Signed)
[facility] for Therapeutic Lovenox Indication: atrial fibrillation  Patient Measurements: Height: '6\' 2"'$  (188 cm) Weight: 97.1 kg (214 lb) IBW/kg (Calculated) : 82.2  Labs: Recent Labs    06/13/21 2005 06/14/21 0348 06/14/21 0600 06/14/21 0846 06/14/21 1720 06/15/21 0456  HGB 14.5  --  11.1* 11.9*  --   --   HCT 41.7  --  31.7* 35.0*  --   --   PLT 369  --  251 292  --   --   APTT  --  31  --   --   --   --   LABPROT  --  16.5*  --  14.7  --   --   INR  --  1.3*  --  1.2  --   --   CREATININE 0.80  --  0.98 1.22 1.06 0.97    Estimated Creatinine Clearance: 96.5 mL/min (by C-G formula based on SCr of 0.97 mg/dL).   Medical History: Past Medical History:  Diagnosis Date   Arthritis    on operative finger   Dental crowns present    Diabetes type 1, uncontrolled ([place])    IDDM (Dr. Eddie Dibbles @ [facility])   Esophageal reflux    Goiter 04/2012   no current med, has yearly monitoring   High cholesterol    HTN (hypertension)    under control with meds, has been on med x 2 yr.    Medications:  Apixaban 5 mg BID prior to admission (last patient reported dose 06/13/21)  Kcentra 50 units/kg on 8/7  Assessment: Patient is a 60 y/o M with medical history as above including Afib on apixaban who is admitted with perforated diverticulitis. Patient is status post sigmoid colectomy with Hartmann's procedure on 8/7. Pharmacy consulted to start therapeutic enoxaparin for Afib while home apixaban on hold.    Plan:  --Enoxaparin 97.5 mg (1 mg/kg) to start 8/8 at 2200 --CBC at least every 3 days per protocol; CBC ordered for tomorrow AM --Follow-up ability to re-start apixaban  Benita Gutter 06/15/2021,2:42 PM

## 2021-06-16 ENCOUNTER — Inpatient Hospital Stay: Payer: BC Managed Care – PPO

## 2021-06-16 ENCOUNTER — Inpatient Hospital Stay: Payer: Self-pay

## 2021-06-16 LAB — PREPARE FRESH FROZEN PLASMA: Unit division: 0

## 2021-06-16 LAB — COMPREHENSIVE METABOLIC PANEL
ALT: 19 U/L (ref 0–44)
AST: 43 U/L — ABNORMAL HIGH (ref 15–41)
Albumin: 1.6 g/dL — ABNORMAL LOW (ref 3.5–5.0)
Alkaline Phosphatase: 178 U/L — ABNORMAL HIGH (ref 38–126)
Anion gap: 7 (ref 5–15)
BUN: 17 mg/dL (ref 6–20)
CO2: 27 mmol/L (ref 22–32)
Calcium: 7.2 mg/dL — ABNORMAL LOW (ref 8.9–10.3)
Chloride: 103 mmol/L (ref 98–111)
Creatinine, Ser: 0.75 mg/dL (ref 0.61–1.24)
GFR, Estimated: >60 mL/min (ref >60)
Glucose, Bld: 69 mg/dL — ABNORMAL LOW (ref 70–99)
Potassium: 3.4 mmol/L — ABNORMAL LOW (ref 3.5–5.1)
Sodium: 137 mmol/L (ref 135–145)
Total Bilirubin: 1.3 mg/dL — ABNORMAL HIGH (ref 0.3–1.2)
Total Protein: 5.1 g/dL — ABNORMAL LOW (ref 6.5–8.1)

## 2021-06-16 LAB — BPAM FFP
Blood Product Expiration Date: 202208122359
Blood Product Expiration Date: 202208122359
Blood Product Expiration Date: 202208122359
Blood Product Expiration Date: 202208122359
Unit Type and Rh: 5100
Unit Type and Rh: 5100
Unit Type and Rh: 5100
Unit Type and Rh: 5100

## 2021-06-16 LAB — MAGNESIUM: Magnesium: 1.9 mg/dL (ref 1.7–2.4)

## 2021-06-16 LAB — GLUCOSE, CAPILLARY
Glucose-Capillary: 46 mg/dL — ABNORMAL LOW (ref 70–99)
Glucose-Capillary: 71 mg/dL (ref 70–99)
Glucose-Capillary: 75 mg/dL (ref 70–99)
Glucose-Capillary: 118 mg/dL — ABNORMAL HIGH (ref 70–99)
Glucose-Capillary: 45 mg/dL — ABNORMAL LOW (ref 70–99)
Glucose-Capillary: 120 mg/dL — ABNORMAL HIGH (ref 70–99)
Glucose-Capillary: 64 mg/dL — ABNORMAL LOW (ref 70–99)
Glucose-Capillary: 79 mg/dL (ref 70–99)
Glucose-Capillary: 132 mg/dL — ABNORMAL HIGH (ref 70–99)

## 2021-06-16 LAB — PHOSPHORUS
Phosphorus: 2.1 mg/dL — ABNORMAL LOW (ref 2.5–4.6)
Phosphorus: 3.7 mg/dL (ref 2.5–4.6)

## 2021-06-16 LAB — CBC
HCT: 33.8 % — ABNORMAL LOW (ref 39.0–52.0)
Hemoglobin: 11.6 g/dL — ABNORMAL LOW (ref 13.0–17.0)
MCH: 33.8 pg (ref 26.0–34.0)
MCHC: 34.3 g/dL (ref 30.0–36.0)
MCV: 98.5 fL (ref 80.0–100.0)
Platelets: 281 10*3/uL (ref 150–400)
RBC: 3.43 MIL/uL — ABNORMAL LOW (ref 4.22–5.81)
RDW: 15.2 % (ref 11.5–15.5)
WBC: 11.7 10*3/uL — ABNORMAL HIGH (ref 4.0–10.5)
nRBC: 0 % (ref 0.0–0.2)

## 2021-06-16 LAB — POTASSIUM: Potassium: 3.3 mmol/L — ABNORMAL LOW (ref 3.5–5.1)

## 2021-06-16 MED ORDER — POLYETHYLENE GLYCOL 3350 17 G PO PACK
17.0000 g | PACK | Freq: Every day | ORAL | Status: DC
  Administered 2021-06-18 – 2021-06-25 (×4): 17 g via ORAL
  Filled 2021-06-16 (×6): qty 1

## 2021-06-16 MED ORDER — THIAMINE HCL 100 MG/ML IJ SOLN
500.0000 mg | Freq: Every day | INTRAVENOUS | Status: AC
  Administered 2021-06-16 – 2021-06-19 (×4): 500 mg via INTRAVENOUS
  Filled 2021-06-16 (×4): qty 5

## 2021-06-16 MED ORDER — DEXTROSE 50 % IV SOLN
INTRAVENOUS | Status: AC
  Administered 2021-06-16: 50 mL via INTRAVENOUS
  Filled 2021-06-16: qty 50

## 2021-06-16 MED ORDER — KCL-LACTATED RINGERS-D5W 20 MEQ/L IV SOLN
INTRAVENOUS | Status: DC
  Filled 2021-06-16 (×5): qty 1000

## 2021-06-16 MED ORDER — LACTATED RINGERS IV SOLN: INTRAVENOUS | Status: DC

## 2021-06-16 MED ORDER — POTASSIUM PHOSPHATES 15 MMOLE/5ML IV SOLN
30.0000 mmol | Freq: Once | INTRAVENOUS | Status: AC
  Administered 2021-06-16: 30 mmol via INTRAVENOUS
  Filled 2021-06-16: qty 10

## 2021-06-16 MED ORDER — DEXMEDETOMIDINE HCL IN NACL 400 MCG/100ML IV SOLN
0.4000 ug/kg/h | INTRAVENOUS | Status: DC
  Administered 2021-06-16 – 2021-06-17 (×3): 0.4 ug/kg/h via INTRAVENOUS
  Administered 2021-06-18: 0.5 ug/kg/h via INTRAVENOUS
  Administered 2021-06-18: 0.6 ug/kg/h via INTRAVENOUS
  Administered 2021-06-18: 0.8 ug/kg/h via INTRAVENOUS
  Filled 2021-06-16 (×6): qty 100

## 2021-06-16 MED ORDER — SODIUM CHLORIDE 0.9% FLUSH
10.0000 mL | Freq: Two times a day (BID) | INTRAVENOUS | Status: DC
  Administered 2021-06-16 – 2021-06-25 (×18): 10 mL

## 2021-06-16 MED ORDER — SODIUM CHLORIDE 0.9% FLUSH: 10.0000 mL | INTRAVENOUS | Status: DC | PRN

## 2021-06-16 MED ORDER — PIPERACILLIN-TAZOBACTAM 3.375 G IVPB
3.3750 g | Freq: Three times a day (TID) | INTRAVENOUS | Status: DC
  Administered 2021-06-17 – 2021-06-21 (×14): 3.375 g via INTRAVENOUS
  Filled 2021-06-16 (×12): qty 50

## 2021-06-16 MED ORDER — DEXTROSE 5 % IV SOLN: INTRAVENOUS | Status: DC

## 2021-06-16 MED ORDER — POTASSIUM CHLORIDE 10 MEQ/100ML IV SOLN
10.0000 meq | INTRAVENOUS | Status: AC
  Filled 2021-06-16 (×2): qty 100

## 2021-06-16 MED ORDER — INSULIN GLARGINE-YFGN 100 UNIT/ML ~~LOC~~ SOLN
7.0000 [IU] | Freq: Every day | SUBCUTANEOUS | Status: DC
  Filled 2021-06-16: qty 0.07

## 2021-06-16 MED ORDER — SODIUM CHLORIDE 0.9 % IV BOLUS
500.0000 mL | Freq: Once | INTRAVENOUS | Status: AC
  Administered 2021-06-16: 500 mL via INTRAVENOUS

## 2021-06-16 MED ORDER — CALCIUM GLUCONATE-NACL 1-0.675 GM/50ML-% IV SOLN
1.0000 g | Freq: Once | INTRAVENOUS | Status: AC
  Administered 2021-06-16: 1000 mg via INTRAVENOUS
  Filled 2021-06-16: qty 50

## 2021-06-16 NOTE — Progress Notes (Signed)
Pt has increase O2 needs after vomiting. Lungs sounds are coarse/crackels/expiratory wheezes. Pt cough is incessant, productive, and weak. O2 via [place] increased from 2L to 6L to maintain O2 saturation >90%. Provider notified, awaiting response.   06/16/21 0400  Vitals  Patient Position (if appropriate) Lying  Pulse Rate (!) 102  Pulse Rate Source Monitor  ECG Heart Rate (!) 104  Resp (!) 34  Oxygen Therapy  SpO2 (!) 77 %  Art Line  Arterial Line BP 151/72  Arterial Line MAP (mmHg) 99 mmHg  MEWS Score  MEWS Temp 0  MEWS Systolic 0  MEWS Pulse 1  MEWS RR 2  MEWS LOC 0  MEWS Score 3  MEWS Score Color Yellow

## 2021-06-16 NOTE — Progress Notes (Signed)
[facility] PROGRESS NOTE    Russell Gomez  [id number] DOB: 05/19/1961 DOA: 06/14/2021 PCP: Ria Bush, MD      Brief Narrative:  Mr. Schaus is a 60 year old M with type I DM, A. fib on Eliquis and alcohol use disorder presenting with several days progressive abdominal pain, not severe.  In the ER, tachycardic, lactate greater than 4, leukocytosis and tachypneic.  Hypoxic to 89% on room air.  CT abdomen and pelvis showed perforated bowel.  Taken emergently to the OR for ex lap.           Assessment & Plan:  Severe sepsis due to perforated diverticulitis Presented with abdominal pain, CT showed perforated sigmoid diverticulitis.  Also had tachycardia, leukocytosis, tachypnea and lung failure as well as elevated lactic acid at the time of presentation.  Stable, no new fever  -Continue Zosyn, day 3     Perforated sigmoid diverticulitis Status post sigmoid colectomy with Hartmann procedure and colostomy 8/7 by Dr. Peyton Najjar  - Postop care by Dr. Peyton Najjar     Aspiration event 8/9 NG tube out 8/8 evening, overnight, patient had vomiting and choking/coughing.  Chest x-ray clear but worsening hypoxia. - Continue Zosyn - Wean oxygen as able     Acute hypoxic respiratory failure due to sepsis Tachypneic and hypoxic at admission in the setting of perforated diverticulitis and peritonitis. - Wean oxygen as able   Acute metabolic encephalopathy At baseline the patient is independent, has no cognitive impairment.  Currently he is somnolent and at times agitated and disoriented.  Likely due to peritonitis, hypoglycemia   Type 1 diabetes, moderate control Early DKA, now resolved A1c 8.2% last month Here was hyperglycemic and with mild acidosis at admission.  Started on an insulin drip  and gap closed, transitioned to SubQ insulin  Glucose <60 this morning given too much Lantus - D5W infusion for now - Reduce Lantus to 7 daily, half home  dose - Continue sliding scale corrections    Hypertension BP remains soft - Hold furoesmide - Continue metoprolol  Paroxysmal atrial fibrillation Surgery would like to use Lovenox prior to resuming Eliquis - Continue Lovenox - Hold home Eliquis - Continue metoprolol   Alcohol use disorder with history of withdrawal Somewhat agitated and confused today - Continue stepdown frequency CIWA scoring - Continue thiamine and folate - Continue stepdown Ativan protocol  GERD - Continue home PPI   Hypokalemia Hypocalcemia Hypomagnesemia Hypophosphatemia - Supplement potassium, phosphorus, calcium   Insomnia - Continue trazodone at night       Disposition: Status is: Inpatient  Remains inpatient appropriate because:IV treatments appropriate due to intensity of illness or inability to take PO  Dispo: The patient is from: Home              Anticipated d/c is to: Home              Patient currently is not medically stable to d/c.   Difficult to place patient No       Level of care: Stepdown       MDM: The below labs and imaging reports were reviewed and summarized above.  Medication management as above.  This is an acute illness was severe with threat to life  DVT prophylaxis: SCDs Start: 06/14/21 0421  Code Status: Full code Family Communication:      Procedures:  8/7 CT abdomen and pelvis with contrast-perforated bowel with peritonitis.  The suspicion for Crohn's disease was ruled out on exploratory laparotomy  8/7 sigmoid colectomy with colostomy  Antimicrobials:  Zosyn 8/7>>  Culture data:  8/8 blood culture x2          Subjective: Abdominal pain persists, no improvement.  No fever.  He is drowsy and confused this morning.  He is hypoglycemic.  He had an aspiration event last night.  Objective: Vitals:   06/16/21 0648 06/16/21 0800 06/16/21 0855 06/16/21 0919  BP: (!) 159/86     Pulse:  99 96   Resp:  (!) 24 20   Temp:  98.3 F  (36.8 C)    TempSrc:  Axillary    SpO2:  97% 94% 96%  Weight:      Height:        Intake/Output Summary (Last 24 hours) at 06/16/2021 1351 Last data filed at 06/16/2021 1239 Gross per 24 hour  Intake 550 ml  Output 870 ml  Net -320 ml   Filed Weights   06/13/21 1932  Weight: 97.1 kg    Examination: General appearance: Adult male, lying in bed, somnolent     HEENT: Anicteric, conjunctival pink, lids and lashes normal.  No nasal deformity, discharge, epistaxis.  Lips dry and cracked, oropharynx dry, no oral lesions Skin:  Cardiac: Tachycardic, regular, no murmurs appreciated, no JVD or edema. Respiratory: Nontachypneic, respirations shallow, no rales or wheezing appreciated Abdomen: Involuntary guarding noted, no rigidity or rebound, appears diffusely tender.  No distention. MSK: Normal muscle bulk and tone, no deformities or effusions. Neuro: Sleepy, stirs sluggishly to touch, appears to move upper extremities with symmetric generalized weakness, no spontaneous verbalizations, face appears symmetric Psych: Somnolent, attention diminished     Data Reviewed: I have personally reviewed following labs and imaging studies:  CBC: Recent Labs  Lab 06/13/21 2005 06/14/21 0600 06/14/21 0846 06/16/21 0431  WBC 11.6* 11.5* 12.6* 11.7*  HGB 14.5 11.1* 11.9* 11.6*  HCT 41.7 31.7* 35.0* 33.8*  MCV 97.9 99.4 100.3* 98.5  PLT 369 251 292 [id number]   Basic Metabolic Panel: Recent Labs  Lab 06/14/21 0600 06/14/21 0846 06/14/21 1720 06/15/21 0456 06/16/21 0431  NA 133* 135 138 139 137  K 3.8 3.2* 3.3* 3.1* 3.4*  CL 93* 98 102 105 103  CO2 24 20* '31 30 27  '$ GLUCOSE 510* 474* 142* 101* 69*  BUN '10 12 15 18 17  '$ CREATININE 0.98 1.22 1.06 0.97 0.75  CALCIUM 6.4* 7.7* 7.1* 6.5* 7.2*  MG 0.9* 1.5* 1.8 1.6* 1.9  PHOS 4.4 5.4* 3.8  --  2.1*   GFR: Estimated Creatinine Clearance: 117 mL/min (by C-G formula based on SCr of 0.75 mg/dL). Liver Function Tests: Recent Labs  Lab  06/13/21 2005 06/14/21 0846 06/16/21 0431  AST 58* 54* 43*  ALT 32 23 19  ALKPHOS 200* 153* 178*  BILITOT 2.0* 1.9* 1.3*  PROT 5.8* 4.8* 5.1*  ALBUMIN 2.4* 1.8* 1.6*   Recent Labs  Lab 06/13/21 2005  LIPASE 18   No results for input(s): AMMONIA in the last 168 hours. Coagulation Profile: Recent Labs  Lab 06/14/21 0348 06/14/21 0846  INR 1.3* 1.2   Cardiac Enzymes: No results for input(s): CKTOTAL, CKMB, CKMBINDEX, TROPONINI in the last 168 hours. BNP (last 3 results) No results for input(s): PROBNP in the last 8760 hours. HbA1C: No results for input(s): HGBA1C in the last 72 hours. CBG: Recent Labs  Lab 06/16/21 0350 06/16/21 0428 06/16/21 0812 06/16/21 0829 06/16/21 1054  GLUCAP 75 71 45* 118* 120*   Lipid Profile: No results for input(s): CHOL, HDL,  LDLCALC, TRIG, CHOLHDL, LDLDIRECT in the last 72 hours. Thyroid Function Tests: No results for input(s): TSH, T4TOTAL, FREET4, T3FREE, THYROIDAB in the last 72 hours. Anemia Panel: No results for input(s): VITAMINB12, FOLATE, FERRITIN, TIBC, IRON, RETICCTPCT in the last 72 hours. Urine analysis:    Component Value Date/Time   COLORURINE AMBER (A) 06/14/2021 0348   APPEARANCEUR HAZY (A) 06/14/2021 0348   LABSPEC 1.030 06/14/2021 0348   PHURINE 5.0 06/14/2021 0348   GLUCOSEU >=500 (A) 06/14/2021 0348   HGBUR NEGATIVE 06/14/2021 0348   BILIRUBINUR NEGATIVE 06/14/2021 0348   BILIRUBINUR 1+ 04/16/2020 1242   KETONESUR 5 (A) 06/14/2021 0348   PROTEINUR NEGATIVE 06/14/2021 0348   UROBILINOGEN 1.0 04/16/2020 1242   UROBILINOGEN 0.2 01/14/2010 0100   NITRITE NEGATIVE 06/14/2021 0348   LEUKOCYTESUR NEGATIVE 06/14/2021 0348   Sepsis Labs: '@LABRCNTIP'$ (procalcitonin:4,lacticacidven:4)  ) Recent Results (from the past 240 hour(s))  Resp Panel by RT-PCR (Flu A&B, Covid) Nasopharyngeal Swab     Status: None   Collection Time: 06/14/21  3:48 AM   Specimen: Nasopharyngeal Swab; Nasopharyngeal(NP) swabs in vial  transport medium  Result Value Ref Range Status   SARS Coronavirus 2 by RT PCR NEGATIVE NEGATIVE Final    Comment: (NOTE) SARS-CoV-2 target nucleic acids are NOT DETECTED.  The SARS-CoV-2 RNA is generally detectable in upper respiratory specimens during the acute phase of infection. The lowest concentration of SARS-CoV-2 viral copies this assay can detect is 138 copies/mL. A negative result does not preclude SARS-Cov-2 infection and should not be used as the sole basis for treatment or other patient management decisions. A negative result may occur with  improper specimen collection/handling, submission of specimen other than nasopharyngeal swab, presence of viral mutation(s) within the areas targeted by this assay, and inadequate number of viral copies(<138 copies/mL). A negative result must be combined with clinical observations, patient history, and epidemiological information. The expected result is Negative.  Fact Sheet for Patients:  EntrepreneurPulse.com.au  Fact Sheet for Healthcare Providers:  IncredibleEmployment.be  This test is no t yet approved or cleared by the [place] FDA and  has been authorized for detection and/or diagnosis of SARS-CoV-2 by FDA under an Emergency Use Authorization (EUA). This EUA will remain  in effect (meaning this test can be used) for the duration of the COVID-19 declaration under Section 564(b)(1) of the Act, 21 U.S.C.section 360bbb-3(b)(1), unless the authorization is terminated  or revoked sooner.       Influenza A by PCR NEGATIVE NEGATIVE Final   Influenza B by PCR NEGATIVE NEGATIVE Final    Comment: (NOTE) The Xpert Xpress SARS-CoV-2/FLU/RSV plus assay is intended as an aid in the diagnosis of influenza from Nasopharyngeal swab specimens and should not be used as a sole basis for treatment. Nasal washings and aspirates are unacceptable for Xpert Xpress SARS-CoV-2/FLU/RSV testing.  Fact  Sheet for Patients: EntrepreneurPulse.com.au  Fact Sheet for Healthcare Providers: IncredibleEmployment.be  This test is not yet approved or cleared by the [place] FDA and has been authorized for detection and/or diagnosis of SARS-CoV-2 by FDA under an Emergency Use Authorization (EUA). This EUA will remain in effect (meaning this test can be used) for the duration of the COVID-19 declaration under Section 564(b)(1) of the Act, 21 U.S.C. section 360bbb-3(b)(1), unless the authorization is terminated or revoked.  Performed at [facility], [address], [place], [place] [address]   MRSA Next Gen by PCR, Nasal     Status: None   Collection Time: 06/14/21 10:10 AM  Specimen: Nasal Mucosa; Nasal Swab  Result Value Ref Range Status   MRSA by PCR Next Gen NOT DETECTED NOT DETECTED Final    Comment: (NOTE) The GeneXpert MRSA Assay (FDA approved for NASAL specimens only), is one component of a comprehensive MRSA colonization surveillance program. It is not intended to diagnose MRSA infection nor to guide or monitor treatment for MRSA infections. Test performance is not FDA approved in patients less than 26 years old. Performed at [facility], [address] [place], [place] [address]   CULTURE, BLOOD (ROUTINE X 2) w Reflex to ID Panel     Status: None (Preliminary result)   Collection Time: 06/15/21 11:46 AM   Specimen: BLOOD  Result Value Ref Range Status   Specimen Description BLOOD BLOOD LEFT ARM  Final   Special Requests   Final    BOTTLES DRAWN AEROBIC AND ANAEROBIC Blood Culture adequate volume   Culture   Final    NO GROWTH < 24 HOURS Performed at [facility], [address], [place], [place] [address]    Report Status PENDING  Incomplete  CULTURE, BLOOD (ROUTINE X 2) w Reflex to ID Panel     Status: None (Preliminary result)   Collection Time: 06/15/21 11:46 AM   Specimen: BLOOD  Result Value  Ref Range Status   Specimen Description BLOOD BLOOD RIGHT ARM  Final   Special Requests   Final    BOTTLES DRAWN AEROBIC AND ANAEROBIC Blood Culture results may not be optimal due to an inadequate volume of blood received in culture bottles   Culture   Final    NO GROWTH < 24 HOURS Performed at [facility], [address] [place], [place] [address]    Report Status PENDING  Incomplete         Radiology Studies: DG Chest Port 1 View  Result Date: 06/16/2021 CLINICAL DATA:  60 year old male with aspiration of gastric contents. EXAM: PORTABLE CHEST 1 VIEW COMPARISON:  Portable chest 12/26/2020 and earlier. FINDINGS: Portable AP upright view at 0435 hours. Lower lung volumes. Mediastinal contours remain normal. Visualized tracheal air column is within normal limits. Mild crowding of lung markings diffusely now. Otherwise Allowing for portable technique the lungs are clear. No confluent opacity. Visible bowel-gas pattern is within normal limits. No acute osseous abnormality identified. IMPRESSION: Low lung volumes.  No acute cardiopulmonary abnormality. Electronically Signed   By: Genevie Ann M.D.   On: 06/16/2021 05:12   DG Abd 2 Views  Result Date: 06/16/2021 CLINICAL DATA:  Postoperative nausea and vomiting, abdominal pain, colon resection and appendectomy on 06/14/2021 EXAM: ABDOMEN - 2 VIEW COMPARISON:  CT abdomen and pelvis 06/14/2021 FINDINGS: Surgical drain in pelvis. Dilated a air-filled small bowel and to lesser degree colonic loops in the abdomen and pelvis consistent with postoperative ileus. No bowel wall thickening seen. Scattered atherosclerotic calcifications aorta. Bones demineralized with degenerative disc disease changes lumbar spine. IMPRESSION: Postoperative ileus. Electronically Signed   By: Lavonia Dana M.D.   On: 06/16/2021 11:19        Scheduled Meds:  Chlorhexidine Gluconate Cloth  6 each Topical Daily   enoxaparin (LOVENOX) injection  1 mg/kg Subcutaneous Q12H    feeding supplement (NEPRO CARB STEADY)  237 mL Oral TID BM   folic acid  1 mg Oral Daily   insulin aspart  0-15 Units Subcutaneous Q4H   insulin glargine-yfgn  7 Units Subcutaneous QHS   metoprolol tartrate  100 mg Oral BID   multivitamin  with minerals  1 tablet Oral Daily   nicotine  21 mg Transdermal Daily   pantoprazole (PROTONIX) IV  40 mg Intravenous QHS   polyethylene glycol  17 g Oral Daily   thiamine  100 mg Oral Daily   Or   thiamine  100 mg Intravenous Daily   traZODone  50 mg Oral QHS   Continuous Infusions:  dextrose 5% lactated ringers with KCl 20 mEq/L     piperacillin-tazobactam (ZOSYN)  IV 3.375 g (06/16/21 0512)   potassium PHOSPHATE IVPB (in mmol) 30 mmol (06/16/21 1234)     LOS: 2 days    Time spent: 35 minutes    Edwin Dada, MD [facility] 06/16/2021, 1:51 PM     Please page though [place] or Epic secure chat:  For [employer], Adult nurse

## 2021-06-16 NOTE — Progress Notes (Signed)
[facility] [place] PROGRESS NOTE    Russell Gomez  [id number] DOB: 07/09/1961 DOA: 06/14/2021 PCP: Ria Bush, MD   Acute event note Called to bedside for evaluation of patient who vomited and might have aspirated.  Previously not on oxygen now requiring 6 L.  He was able to cough up some stuff and was suctioned.  Per nurse, NG tube was removed earlier in the day and he was started on clear liquids   Assessment & Plan:  Acute aspiration - Stat chest x-ray showing no acute abnormality - O2 sat 100% on room air - Continue close monitoring - Continue Zosyn - Keep n.p.o. - Continue aspiration precautions - We will recommend replacing NG tube as well as contact surgery for further guidance   Principal Problem:   Bowel perforation (HCC) Active Problems:   Terminal ileitis ([place])   Hypocalcemia   Hypomagnesemia   Type 1 diabetes mellitus with hyperglycemia (HCC)   Essential hypertension   Alcohol use disorder, moderate, dependence (HCC)   AF (paroxysmal atrial fibrillation) (HCC)   Objective: Vitals:   06/16/21 0400 06/16/21 0500 06/16/21 0514 06/16/21 0600  BP:      Pulse: (!) 102 96 93 88  Resp: (!) 34 (!) 30 (!) 26 (!) 22  Temp:      TempSrc:      SpO2: (!) 77% 95% 94% 97%  Weight:      Height:        Intake/Output Summary (Last 24 hours) at 06/16/2021 0625 Last data filed at 06/16/2021 [id number] Gross per 24 hour  Intake 900.09 ml  Output 1160 ml  Net -259.91 ml   Filed Weights   06/13/21 1932  Weight: 97.1 kg    Examination:  General exam: Appears drowsy somewhat lethargic but not in acute distress respiratory system: Coarse breath sounds anteriorly right lung field. Cardiovascular system: Tachycardic gastrointestinal system: Abdomen is nondistended, soft and nontender.    Data Reviewed: I have personally reviewed following labs and imaging studies  CBC: Recent Labs  Lab 06/13/21 2005 06/14/21 0600 06/14/21 0846 06/16/21 0431  WBC 11.6* 11.5*  12.6* 11.7*  HGB 14.5 11.1* 11.9* 11.6*  HCT 41.7 31.7* 35.0* 33.8*  MCV 97.9 99.4 100.3* 98.5  PLT 369 251 292 [id number]   Basic Metabolic Panel: Recent Labs  Lab 06/14/21 0600 06/14/21 0846 06/14/21 1720 06/15/21 0456 06/16/21 0431  NA 133* 135 138 139 137  K 3.8 3.2* 3.3* 3.1* 3.4*  CL 93* 98 102 105 103  CO2 24 20* '31 30 27  '$ GLUCOSE 510* 474* 142* 101* 69*  BUN '10 12 15 18 17  '$ CREATININE 0.98 1.22 1.06 0.97 0.75  CALCIUM 6.4* 7.7* 7.1* 6.5* 7.2*  MG 0.9* 1.5* 1.8 1.6* 1.9  PHOS 4.4 5.4* 3.8  --  2.1*   GFR: Estimated Creatinine Clearance: 117 mL/min (by C-G formula based on SCr of 0.75 mg/dL). Liver Function Tests: Recent Labs  Lab 06/13/21 2005 06/14/21 0846 06/16/21 0431  AST 58* 54* 43*  ALT 32 23 19  ALKPHOS 200* 153* 178*  BILITOT 2.0* 1.9* 1.3*  PROT 5.8* 4.8* 5.1*  ALBUMIN 2.4* 1.8* 1.6*   Recent Labs  Lab 06/13/21 2005  LIPASE 18   No results for input(s): AMMONIA in the last 168 hours. Coagulation Profile: Recent Labs  Lab 06/14/21 0348 06/14/21 0846  INR 1.3* 1.2   Cardiac Enzymes: No results for input(s): CKTOTAL, CKMB, CKMBINDEX, TROPONINI in the last 168 hours. BNP (last 3 results) No results  for input(s): PROBNP in the last 8760 hours. HbA1C: No results for input(s): HGBA1C in the last 72 hours. CBG: Recent Labs  Lab 06/15/21 1925 06/15/21 2318 06/16/21 0318 06/16/21 0350 06/16/21 0428  GLUCAP 191* 164* 46* 75 71   Lipid Profile: No results for input(s): CHOL, HDL, LDLCALC, TRIG, CHOLHDL, LDLDIRECT in the last 72 hours. Thyroid Function Tests: No results for input(s): TSH, T4TOTAL, FREET4, T3FREE, THYROIDAB in the last 72 hours. Anemia Panel: No results for input(s): VITAMINB12, FOLATE, FERRITIN, TIBC, IRON, RETICCTPCT in the last 72 hours. Sepsis Labs: Recent Labs  Lab 06/14/21 0348 06/15/21 0800  LATICACIDVEN 4.5* 1.0    Recent Results (from the past 240 hour(s))  Resp Panel by RT-PCR (Flu A&B, Covid) Nasopharyngeal  Swab     Status: None   Collection Time: 06/14/21  3:48 AM   Specimen: Nasopharyngeal Swab; Nasopharyngeal(NP) swabs in vial transport medium  Result Value Ref Range Status   SARS Coronavirus 2 by RT PCR NEGATIVE NEGATIVE Final    Comment: (NOTE) SARS-CoV-2 target nucleic acids are NOT DETECTED.  The SARS-CoV-2 RNA is generally detectable in upper respiratory specimens during the acute phase of infection. The lowest concentration of SARS-CoV-2 viral copies this assay can detect is 138 copies/mL. A negative result does not preclude SARS-Cov-2 infection and should not be used as the sole basis for treatment or other patient management decisions. A negative result may occur with  improper specimen collection/handling, submission of specimen other than nasopharyngeal swab, presence of viral mutation(s) within the areas targeted by this assay, and inadequate number of viral copies(<138 copies/mL). A negative result must be combined with clinical observations, patient history, and epidemiological information. The expected result is Negative.  Fact Sheet for Patients:  EntrepreneurPulse.com.au  Fact Sheet for Healthcare Providers:  IncredibleEmployment.be  This test is no t yet approved or cleared by the [place] FDA and  has been authorized for detection and/or diagnosis of SARS-CoV-2 by FDA under an Emergency Use Authorization (EUA). This EUA will remain  in effect (meaning this test can be used) for the duration of the COVID-19 declaration under Section 564(b)(1) of the Act, 21 U.S.C.section 360bbb-3(b)(1), unless the authorization is terminated  or revoked sooner.       Influenza A by PCR NEGATIVE NEGATIVE Final   Influenza B by PCR NEGATIVE NEGATIVE Final    Comment: (NOTE) The Xpert Xpress SARS-CoV-2/FLU/RSV plus assay is intended as an aid in the diagnosis of influenza from Nasopharyngeal swab specimens and should not be used as a sole  basis for treatment. Nasal washings and aspirates are unacceptable for Xpert Xpress SARS-CoV-2/FLU/RSV testing.  Fact Sheet for Patients: EntrepreneurPulse.com.au  Fact Sheet for Healthcare Providers: IncredibleEmployment.be  This test is not yet approved or cleared by the [place] FDA and has been authorized for detection and/or diagnosis of SARS-CoV-2 by FDA under an Emergency Use Authorization (EUA). This EUA will remain in effect (meaning this test can be used) for the duration of the COVID-19 declaration under Section 564(b)(1) of the Act, 21 U.S.C. section 360bbb-3(b)(1), unless the authorization is terminated or revoked.  Performed at [facility], [address] [place], [place] [address]   MRSA Next Gen by PCR, Nasal     Status: None   Collection Time: 06/14/21 10:10 AM   Specimen: Nasal Mucosa; Nasal Swab  Result Value Ref Range Status   MRSA by PCR Next Gen NOT DETECTED NOT DETECTED Final    Comment: (NOTE) The GeneXpert MRSA Assay (FDA approved  for NASAL specimens only), is one component of a comprehensive MRSA colonization surveillance program. It is not intended to diagnose MRSA infection nor to guide or monitor treatment for MRSA infections. Test performance is not FDA approved in patients less than 66 years old. Performed at [facility], [address] [place], [place] [address]   CULTURE, BLOOD (ROUTINE X 2) w Reflex to ID Panel     Status: None (Preliminary result)   Collection Time: 06/15/21 11:46 AM   Specimen: BLOOD  Result Value Ref Range Status   Specimen Description BLOOD BLOOD LEFT ARM  Final   Special Requests   Final    BOTTLES DRAWN AEROBIC AND ANAEROBIC Blood Culture adequate volume   Culture   Final    NO GROWTH < 24 HOURS Performed at [facility], [address] [place], [place] [address]    Report Status PENDING  Incomplete  CULTURE, BLOOD (ROUTINE X 2) w Reflex to  ID Panel     Status: None (Preliminary result)   Collection Time: 06/15/21 11:46 AM   Specimen: BLOOD  Result Value Ref Range Status   Specimen Description BLOOD BLOOD RIGHT ARM  Final   Special Requests   Final    BOTTLES DRAWN AEROBIC AND ANAEROBIC Blood Culture results may not be optimal due to an inadequate volume of blood received in culture bottles   Culture   Final    NO GROWTH < 24 HOURS Performed at [facility], [address], [place],  [address]    Report Status PENDING  Incomplete         Radiology Studies: DG Chest Port 1 View  Result Date: 06/16/2021 CLINICAL DATA:  60 year old male with aspiration of gastric contents. EXAM: PORTABLE CHEST 1 VIEW COMPARISON:  Portable chest 12/26/2020 and earlier. FINDINGS: Portable AP upright view at 0435 hours. Lower lung volumes. Mediastinal contours remain normal. Visualized tracheal air column is within normal limits. Mild crowding of lung markings diffusely now. Otherwise Allowing for portable technique the lungs are clear. No confluent opacity. Visible bowel-gas pattern is within normal limits. No acute osseous abnormality identified. IMPRESSION: Low lung volumes.  No acute cardiopulmonary abnormality. Electronically Signed   By: Genevie Ann M.D.   On: 06/16/2021 05:12        Scheduled Meds:  Chlorhexidine Gluconate Cloth  6 each Topical Daily   enoxaparin (LOVENOX) injection  1 mg/kg Subcutaneous Q12H   feeding supplement (NEPRO CARB STEADY)  237 mL Oral TID BM   folic acid  1 mg Oral Daily   insulin aspart  0-15 Units Subcutaneous Q4H   insulin glargine-yfgn  15 Units Subcutaneous QHS   metoprolol tartrate  100 mg Oral BID   multivitamin with minerals  1 tablet Oral Daily   nicotine  21 mg Transdermal Daily   pantoprazole (PROTONIX) IV  40 mg Intravenous QHS   thiamine  100 mg Oral Daily   Or   thiamine  100 mg Intravenous Daily   traZODone  50 mg Oral QHS   Continuous Infusions:  piperacillin-tazobactam  (ZOSYN)  IV 3.375 g (06/16/21 0512)     LOS: 2 days    Time spent: 30 CRITICAL CARE Performed by: Athena Masse   Total critical care time: 75mnutes  Critical care time was exclusive of separately billable procedures and treating other patients.  Critical care was necessary to treat or prevent imminent or life-threatening deterioration.  Critical care was time spent personally by me on the following  activities: development of treatment plan with patient and/or surrogate as well as nursing, discussions with consultants, evaluation of patient's response to treatment, examination of patient, obtaining history from patient or surrogate, ordering and performing treatments and interventions, ordering and review of laboratory studies, ordering and review of radiographic studies, pulse oximetry and re-evaluation of patient's condition.     Athena Masse, MD [facility]

## 2021-06-16 NOTE — Progress Notes (Signed)
Patient ID: Russell Gomez, male   DOB: September 07, 1961, 60 y.o.   MRN: [id number]     [place] [facility] Day(s): 2.   Interval History: Patient seen and examined, no acute events or new complaints overnight. Patient was calm and sleepy.  As per nurse patient had episode of vomiting last night.  This morning patient without nausea.  He does have distention.  Abdominal x-rays shows ileus bladder.  Vital signs in last 24 hours: [min-max] current  Temp:  [98 F (36.7 C)-98.3 F (36.8 C)] 98 F (36.7 C) (08/08 1600) Pulse Rate:  [77-125] 88 (08/09 0600) Resp:  [13-34] 22 (08/09 0600) BP: (97-159)/(64-86) 159/86 (08/09 0648) SpO2:  [77 %-99 %] 97 % (08/09 0600) Arterial Line BP: (90-151)/(50-78) 139/67 (08/09 0600)     Height: '6\' 2"'$  (188 cm) Weight: 97.1 kg BMI (Calculated): 27.46   Physical Exam:  Constitutional: alert, cooperative and no distress  Respiratory: breathing non-labored at rest  Cardiovascular: regular rate and sinus rhythm  Gastrointestinal: soft, non-tender, and distended.  Colostomy pink and patent with edema  Labs:  CBC Latest Ref Rng & Units 06/16/2021 06/14/2021 06/14/2021  WBC 4.0 - 10.5 K/uL 11.7(H) 12.6(H) 11.5(H)  Hemoglobin 13.0 - 17.0 g/dL 11.6(L) 11.9(L) 11.1(L)  Hematocrit 39.0 - 52.0 % 33.8(L) 35.0(L) 31.7(L)  Platelets 150 - 400 K/uL 281 292 251   CMP Latest Ref Rng & Units 06/16/2021 06/15/2021 06/14/2021  Glucose 70 - 99 mg/dL 69(L) 101(H) 142(H)  BUN 6 - 20 mg/dL '17 18 15  '$ Creatinine 0.61 - 1.24 mg/dL 0.75 0.97 1.06  Sodium 135 - 145 mmol/L 137 139 138  Potassium 3.5 - 5.1 mmol/L 3.4(L) 3.1(L) 3.3(L)  Chloride 98 - 111 mmol/L 103 105 102  CO2 22 - 32 mmol/L '27 30 31  '$ Calcium 8.9 - 10.3 mg/dL 7.2(L) 6.5(L) 7.1(L)  Total Protein 6.5 - 8.1 g/dL 5.1(L) - -  Total Bilirubin 0.3 - 1.2 mg/dL 1.3(H) - -  Alkaline Phos 38 - 126 U/L 178(H) - -  AST 15 - 41 U/L 43(H) - -  ALT 0 - 44 U/L 19 - -    Imaging studies: No new pertinent imaging  studies   Assessment/Plan:  60 y.o. male with diverticulitis with perforation 2 Day Post-Op s/p Hartman's procedure, complicated by pertinent comorbidities including type 1 diabetes, A. fib and on anticoagulation, chronic alcohol abuse.  Patient has developed postoperative ileus which is expected after this type of surgery especially with generalized peritonitis.  NGT attempted last night was unsuccessful.  Since he is not nausea at this moment we will hold to see if ileus resolves spontaneously.  If he develop any nausea will be tried to put NGT.  We will keep n.p.o. until we had recurrent bowel function.  We will continue with IV antibiotic therapy.  Appreciate hospitalist management of her medical comorbidities.  We will continue in stepdown status due to low blood pressure and low blood sugar that needs close monitoring.  Patient can get out of bed.  We will continue with therapeutic anticoagulation.  Arnold Long, MD

## 2021-06-16 NOTE — Progress Notes (Signed)
Peripherally Inserted Central Catheter Placement  The IV Nurse has discussed with the patient and/or persons authorized to consent for the patient, the purpose of this procedure and the potential benefits and risks involved with this procedure.  The benefits include less needle sticks, lab draws from the catheter, and the patient may be discharged home with the catheter. Risks include, but not limited to, infection, bleeding, blood clot (thrombus formation), and puncture of an artery; nerve damage and irregular heartbeat and possibility to perform a PICC exchange if needed/ordered by physician.  Alternatives to this procedure were also discussed.  Bard Power PICC patient education guide, fact sheet on infection prevention and patient information card has been provided to patient /or left at bedside.    PICC Placement Documentation  PICC Triple Lumen 0000000 PICC Right Basilic 38 cm 0 cm (Active)  Indication for Insertion or Continuance of Line Poor Vasculature-patient has had multiple peripheral attempts or PIVs lasting less than 24 hours 06/16/21 1700  Exposed Catheter (cm) 0 cm 06/16/21 1700  Site Assessment Clean;Dry;Intact 06/16/21 1700  Lumen #1 Status Flushed;Blood return noted 06/16/21 1700  Lumen #2 Status Flushed;Blood return noted 06/16/21 1700  Lumen #3 Status Flushed;Blood return noted 06/16/21 1700  Dressing Type Transparent 06/16/21 1700  Dressing Status Clean;Dry;Intact 06/16/21 1700  Antimicrobial disc in place? Yes 06/16/21 1700  Dressing Change Due 06/23/21 06/16/21 1700       Jule Economy Horton 06/16/2021, 6:01 PM

## 2021-06-16 NOTE — Progress Notes (Signed)
Inpatient Diabetes Program Recommendations  AACE/ADA: New Consensus Statement on Inpatient Glycemic Control   Target Ranges:  Prepandial:   less than 140 mg/dL      Peak postprandial:   less than 180 mg/dL (1-2 hours)      Critically ill patients:  140 - 180 mg/dL   Results for Russell Gomez, Russell Gomez (MRN [id number]) as of 06/16/2021 07:26  Ref. Range 06/15/2021 07:40 06/15/2021 11:19 06/15/2021 16:43 06/15/2021 19:25 06/15/2021 23:18 06/16/2021 03:18 06/16/2021 03:50 06/16/2021 04:28  Glucose-Capillary Latest Ref Range: 70 - 99 mg/dL 115 (H) 173 (H) 175 (H) 191 (H) 164 (H) 46 (L) 75 71    Review of Glycemic Control  Diabetes history: DM1 (makes NO insulin; requires basal, correction, and carb coverage insulin) Outpatient Diabetes medications: Levemir 15 units daily, Regular 5-9 units TID with meals Current orders for Inpatient glycemic control: Semglee 15 units QHS, Novolog 0-15 units Q4H  Inpatient Diabetes Program Recommendations:    Insulin: Noted hypoglycemia today; anticipate due to Novolog moderate correction Q4H. Please consider decreasing Novolog to 0-6 units Q4H.  If patient is eating at least 50% of meals, please consider ordering Novolog 2 units TID with meals for meal coverage if patient eats at least 50% of meals.  Thanks, Barnie Alderman, RN, MSN, CDE Diabetes Coordinator Inpatient Diabetes Program [phone] (Team Pager from 8am to 5pm)

## 2021-06-16 NOTE — Progress Notes (Signed)
   Pt continues to cough and reports feeling nausea. Dr. Prudy Feeler ordered stat chest xray and instructed to re-insert NGT to decompress stomach. 2 attempt made on  NGT insertion, met resistance in left nare and could not advance, the NGT coiled when attempted placement  in right nare. No additional attempts made. Pt reported pain during the proces. Will notify MD of unsuccessful NGT placement  06/16/21 0514  Vitals  Pulse Rate 93  ECG Heart Rate 96  Resp (!) 26  Oxygen Therapy  SpO2 94 %  Art Line  Arterial Line BP 104/54  Arterial Line MAP (mmHg) 68 mmHg  MEWS Score  MEWS Temp 0  MEWS Systolic 0  MEWS Pulse 0  MEWS RR 2  MEWS LOC 0  MEWS Score 2  MEWS Score Color Yellow   attempts

## 2021-06-16 NOTE — Progress Notes (Signed)
Patient disoriented and confused all afternoon.  Agitated and pulling at lines.  Lost IV access numerous times.  Received >10 mg Ativan in increasing doses today.  Will discuss with Critical Care and start Precedex.

## 2021-06-16 NOTE — Consult Note (Addendum)
NAME:  Russell Gomez, MRN:  [id number], DOB:  13-Dec-1960, LOS: 2 ADMISSION DATE:  06/14/2021, CONSULTATION DATE: 06/16/2021 REFERRING MD: Dr. Loleta Books, CHIEF COMPLAINT: Agitation/Delirium  History of Present Illness:  This is a 60 yo male who presented to [facility] ER on 08/7 with a 3 day hx of lower abdominal pain.  He does have a hx of chronic ETOH abuse, however reported his last drink was 08/4.   ED Course: Upon arrival to the ER pt noted to have s/sx of ETOH withdrawal requiring 1 mg of iv ativan and received 100 mg of iv thiamine.  CT Abd Pelvis revealed diverticular disease with perforation.  Therefore, General Surgery consulted and pt underwent emergent sigmoid colon resection with colostomy (Hartmann's procedure) and appendectomy.  He was subsequently admitted to the stepdown unit postop for additional workup and treatment.  However, on 08/9 hospital course complicated by severe ETOH withdrawal symptoms despite administration of ativan per CIWA protocol.  Therefore, precedex gtt ordered and PCCM team consulted to assist with plan of care.   Pertinent  Medical History  ETOH Abuse  Uncontrolled Type I Diabetes Mellitus  DKA GERD  Goiter  Hypercholesteremia  HTN  Significant Hospital Events: Including procedures, antibiotic start and stop dates in addition to other pertinent events   08/7: Pt admitted to the stepdown unit s/p emergent sigmoid colon resection with colostomy (Hartmann's procedure) and appendectomy  08/9: Pt with worsening ETOH withdrawal symptoms requiring Precedex gtt PCCM team consulted   Significant Test Results:  CT Abd Pelvis 08/7>>Hyperenhancement of the wall of the terminal ileum with small amount of free fluid and punctate focus of free air in the ventral abdomen, suggesting acute Crohn's disease. Multifocal punctate free air within the peritoneal cavity indicating perforation at an unknown location. Submucosal fat deposition within the proximal ascending colon and  descending colon, consistent with chronic inflammatory bowel disease.  Interim History / Subjective:  -Pt with severe agitation/delirium   Objective   Blood pressure 105/76, pulse 100, temperature 98.3 F (36.8 C), temperature source Axillary, resp. rate 14, height '6\' 2"'$  (1.88 m), weight 97.1 kg, SpO2 97 %.        Intake/Output Summary (Last 24 hours) at 06/16/2021 1758 Last data filed at 06/16/2021 1400 Gross per 24 hour  Intake 550 ml  Output 870 ml  Net -320 ml   Filed Weights   06/13/21 1932  Weight: 97.1 kg    Examination: General: acutely ill appearing male, with severe agitation  HENT: supple, no JVD  Lungs: faint inspiratory wheezes throughout, even, non labored  Cardiovascular: nsr, s1s2, rrr, no R/G, 2+ radial/2+ distal pulses; 1+ generalized edema  Abdomen: hypoactive BS x4, midline abdominal incision with honeycomb dressing in place with old drainage; right quadrant JP drain with serosanguinous drainage; colostomy bag intact with small amount of watery brown stool  Extremities: moves all extremities  Neuro: confused/agitated, follows commands, PERRLA  GU: indwelling foley catheter draining yellow urine   Resolved Hospital Problem list   N/A  Assessment & Plan:   Diverticular disease with perforation s/p colon resection with colostomy (Hartmann's procedure) and appendectomy~08/9 Postoperative Ileus Trend WBC and monitor fever  Trend PCT  Follow cultures~Blood x1 08/8 negative>> Continue zosyn start date 08/7>> General surgery consulted appreciate input~previous NGT placement unsuccessful if pt develops nausea recommending attempting to place NGT again   Hypotension-improving s/p fluid resuscitation  Hx: HTN  Continuous telemetry monitoring  Hold outpatient antihypertensives for now   Hypokalemia  Hypophosphatemia  Trend BMP  Replace electrolytes as indicated  Monitor UOP   Hypoglycemia  Hx: Uncontrolled Type I diabetes mellitus  Continue D5WLR with 20  meq/L Follow hypoglycemic protocol  Hold SSI and scheduled insulin for now   Acute encephalopathy secondary to ETOH withdrawal  CIWA protocol~mvi and folic acid; will start high dose thiamine  Prn precedex gtt for now   Staples (right click and "Reselect all SmartList Selections" daily)   Diet/type: TPN; will defer to General Surgery  DVT prophylaxis: LMWH GI prophylaxis: PPI Lines: Central line; PICC line placed 08/9 Foley:  Yes, and it is still needed Code Status:  full code Last date of multidisciplinary goals of care discussion [N/A]  ICU Intensivist Dr. Mortimer Fries attempted to contact pts son via telephone to update him regarding worsening ETOH withdrawal symptoms and current plan of care.  However, pts son did not answer the telephone.   Labs   CBC: Recent Labs  Lab 06/13/21 2005 06/14/21 0600 06/14/21 0846 06/16/21 0431  WBC 11.6* 11.5* 12.6* 11.7*  HGB 14.5 11.1* 11.9* 11.6*  HCT 41.7 31.7* 35.0* 33.8*  MCV 97.9 99.4 100.3* 98.5  PLT 369 251 292 [id number]    Basic Metabolic Panel: Recent Labs  Lab 06/14/21 0600 06/14/21 0846 06/14/21 1720 06/15/21 0456 06/16/21 0431  NA 133* 135 138 139 137  K 3.8 3.2* 3.3* 3.1* 3.4*  CL 93* 98 102 105 103  CO2 24 20* '31 30 27  '$ GLUCOSE 510* 474* 142* 101* 69*  BUN '10 12 15 18 17  '$ CREATININE 0.98 1.22 1.06 0.97 0.75  CALCIUM 6.4* 7.7* 7.1* 6.5* 7.2*  MG 0.9* 1.5* 1.8 1.6* 1.9  PHOS 4.4 5.4* 3.8  --  2.1*   GFR: Estimated Creatinine Clearance: 117 mL/min (by C-G formula based on SCr of 0.75 mg/dL). Recent Labs  Lab 06/13/21 2005 06/14/21 0348 06/14/21 0600 06/14/21 0846 06/15/21 0800 06/16/21 0431  WBC 11.6*  --  11.5* 12.6*  --  11.7*  LATICACIDVEN  --  4.5*  --   --  1.0  --     Liver Function Tests: Recent Labs  Lab 06/13/21 2005 06/14/21 0846 06/16/21 0431  AST 58* 54* 43*  ALT 32 23 19  ALKPHOS 200* 153* 178*  BILITOT 2.0* 1.9* 1.3*  PROT 5.8* 4.8* 5.1*  ALBUMIN 2.4* 1.8*  1.6*   Recent Labs  Lab 06/13/21 2005  LIPASE 18   No results for input(s): AMMONIA in the last 168 hours.  ABG    Component Value Date/Time   HCO3 27.3 06/13/2021 2004   O2SAT 85.1 06/13/2021 2004     Coagulation Profile: Recent Labs  Lab 06/14/21 0348 06/14/21 0846  INR 1.3* 1.2    Cardiac Enzymes: No results for input(s): CKTOTAL, CKMB, CKMBINDEX, TROPONINI in the last 168 hours.  HbA1C: Hgb A1c MFr Bld  Date/Time Value Ref Range Status  05/28/2021 12:23 AM 8.2 (H) 4.8 - 5.6 % Final    Comment:    (NOTE) Pre diabetes:          5.7%-6.4%  Diabetes:              >6.4%  Glycemic control for   <7.0% adults with diabetes   12/27/2020 05:21 AM 7.4 (H) 4.8 - 5.6 % Final    Comment:    (NOTE) Pre diabetes:          5.7%-6.4%  Diabetes:              >  6.4%  Glycemic control for   <7.0% adults with diabetes     CBG: Recent Labs  Lab 06/16/21 0812 06/16/21 0829 06/16/21 1054 06/16/21 1520 06/16/21 1547  GLUCAP 45* 118* 120* 64* 79    Review of Systems:   Unable to assess pt confused/agitated   Past Medical History:  He,  has a past medical history of Arthritis, Dental crowns present, Diabetes type 1, uncontrolled ([place]), Esophageal reflux, Goiter (04/2012), High cholesterol, and HTN (hypertension).   Surgical History:   Past Surgical History:  Procedure Laterality Date   APPENDECTOMY  06/14/2021   Procedure: APPENDECTOMY;  Surgeon: Herbert Pun, MD;  Location: [facility];  Service: General;;   COLON RESECTION SIGMOID  06/14/2021   Procedure: COLON RESECTION SIGMOID WITH END COLOSTOMY CREATION;  Surgeon: Herbert Pun, MD;  Location: [facility];  Service: General;;   DISTAL INTERPHALANGEAL JOINT FUSION Right 03/05/2014   Procedure: DEBRIDEMENT (DIP) DISTAL INTERPHALANGEAL RIGHT MIDDLE FINGER;  Surgeon: Wynonia Sours, MD;  Location: [place];  Service: Orthopedics;  Laterality: Right;   KNEE ARTHROSCOPY     LAPAROTOMY N/A  06/14/2021   Procedure: EXPLORATORY LAPAROTOMY;  Surgeon: Herbert Pun, MD;  Location: [facility];  Service: General;  Laterality: N/A;   MASS EXCISION Right 03/05/2014   Procedure: EXCISION CYST ;  Surgeon: Wynonia Sours, MD;  Location: [place];  Service: Orthopedics;  Laterality: Right;  ANESTHESIA: IV REGINAL FAB   OPEN REDUCTION NASAL FRACTURE  12/26/2008   with closure of nasal lac.   ORIF DISTAL RADIUS FRACTURE Right 12/26/2008   ORIF WRIST FRACTURE Left 05/12/2016   takedown of nonunion/malunion and OPEN REDUCTION INTERNAL FIXATION (ORIF) WRIST FRACTURE;  Surgeon: Corky Mull, MD   PERCUTANEOUS PINNING Left 02/06/2013   Procedure: PINNING PIP OF THE LEFT MIDDLE FINGER ;  Surgeon: Wynonia Sours, MD;  Location: [place];  Service: Orthopedics;  Laterality: Left;   SKIN GRAFT SPLIT THICKNESS LEG / FOOT Right 1985   thigh after trauma vs machine at work   TRIGGER FINGER RELEASE  11/28/2012   Procedure: RELEASE TRIGGER FINGER/A-1 PULLEY;  Surgeon: Wynonia Sours, MD;  Laterality: Left;  EXCISION MASS LEFT RING FINGER, RELEASE A-1 PULLEY LEFT RING FINGER (ganglion cyst)     Social History:   reports that he has been smoking cigarettes. He has a 20.00 pack-year smoking history. He has never used smokeless tobacco. He reports current alcohol use of about 7.0 standard drinks of alcohol per week. He reports that he does not use drugs.   Family History:  His family history includes Coronary artery disease in his maternal uncle; Diabetes in his maternal uncle, paternal grandmother, and paternal uncle; Healthy in his father; Hyperlipidemia in his mother; Hypertension in his mother; Stroke in his maternal aunt.   Allergies Allergies  Allergen Reactions   Insulin Aspart Other (See Comments)    CELLULITIS   Spironolactone Nausea Only and Rash   Hydrochlorothiazide Itching and Rash   Morphine Other (See Comments)    "MAKES ME MEAN"     Home Medications  Prior to  Admission medications   Medication Sig Start Date End Date Taking? Authorizing Provider  apixaban (ELIQUIS) 5 MG TABS tablet Take 1 tablet (5 mg total) by mouth 2 (two) times daily. 02/10/21  Yes Gollan, Kathlene November, MD  calcium-vitamin D (OSCAL 500/200 D-3) 500-200 MG-UNIT tablet Take 1 tablet by mouth 2 (two) times daily. 05/29/21 06/28/21  Loletha Grayer, MD  feeding  supplement, GLUCERNA SHAKE, (GLUCERNA SHAKE) LIQD Take 237 mLs by mouth 3 (three) times daily between meals. 05/29/21   Loletha Grayer, MD  folic acid (FOLVITE) 1 MG tablet Take 1 tablet by mouth once daily 05/01/21   Ria Bush, MD  furosemide (LASIX) 40 MG tablet Take 1 tablet (40 mg total) by mouth daily. 01/14/21   Ria Bush, MD  glucose blood test strip Use 6 (six) times daily 01/16/18   [provider]  hydrOXYzine (ATARAX/VISTARIL) 25 MG tablet Take 0.5-1 tablets (12.5-25 mg total) by mouth 2 (two) times daily as needed for anxiety or nausea (sedation precautions). 02/25/21   Ria Bush, MD  insulin detemir (LEVEMIR) 100 UNIT/ML FlexPen Inject 15 Units into the skin daily. 05/29/21   Loletha Grayer, MD  Insulin Pen Needle 34G X 3.5 MM MISC 1 Dose by Does not apply route daily. 05/29/21   Loletha Grayer, MD  insulin regular (NOVOLIN R) 100 units/mL injection Inject 5-9 Units into the skin as directed. Sliding scale as needed.    [provider]  magnesium oxide (MAG-OX) 400 (240 Mg) MG tablet Take 1 tablet (400 mg total) by mouth daily. 05/29/21   Loletha Grayer, MD  metoprolol tartrate (LOPRESSOR) 100 MG tablet Take 1 tablet (100 mg total) by mouth 2 (two) times daily. 12/31/20   Minna Merritts, MD  Multiple Vitamin (MULTIVITAMIN WITH MINERALS) TABS tablet Take 1 tablet by mouth daily. 05/29/21   Loletha Grayer, MD  pantoprazole (PROTONIX) 40 MG tablet Take 1 tablet (40 mg total) by mouth 2 (two) times daily before a meal. 12/15/20   Ria Bush, MD  potassium chloride SA (KLOR-CON M20)  20 MEQ tablet Take 1 tablet (20 mEq total) by mouth daily. 04/30/21   Hinda Kehr, MD  pravastatin (PRAVACHOL) 80 MG tablet Take 1 tablet by mouth once daily 05/01/21   Ria Bush, MD  thiamine 100 MG tablet Take 1 tablet (100 mg total) by mouth daily. 05/29/21   Loletha Grayer, MD  traZODone (DESYREL) 50 MG tablet Take 1 tablet (50 mg total) by mouth at bedtime. 05/29/21   Loletha Grayer, MD     Critical care time: 40 minutes      Rosilyn Mings, [place] Pager [phone] (please enter 7 digits) PCCM Consult Pager [phone] (please enter 7 digits)

## 2021-06-16 NOTE — Progress Notes (Addendum)
Pt is Alert and oriented to self today, drowsy this morning, CBG was 45 @ 0812, Dextrose 50% amp given, CBG up to 118 at 0829. Pt was placed on D5% IVFs.   Pt appears drowsy with frequent agitation episodes. Pt pulled out PIV x 3, and ART line. MD is aware.

## 2021-06-17 DIAGNOSIS — K631 Perforation of intestine (nontraumatic): Secondary | ICD-10-CM

## 2021-06-17 LAB — COMPREHENSIVE METABOLIC PANEL
ALT: 22 U/L (ref 0–44)
AST: 58 U/L — ABNORMAL HIGH (ref 15–41)
Albumin: 1.4 g/dL — ABNORMAL LOW (ref 3.5–5.0)
Alkaline Phosphatase: 223 U/L — ABNORMAL HIGH (ref 38–126)
Anion gap: 4 — ABNORMAL LOW (ref 5–15)
BUN: 12 mg/dL (ref 6–20)
CO2: 30 mmol/L (ref 22–32)
Calcium: 7.3 mg/dL — ABNORMAL LOW (ref 8.9–10.3)
Chloride: 104 mmol/L (ref 98–111)
Creatinine, Ser: 0.62 mg/dL (ref 0.61–1.24)
GFR, Estimated: >60 mL/min (ref >60)
Glucose, Bld: 206 mg/dL — ABNORMAL HIGH (ref 70–99)
Potassium: 3.6 mmol/L (ref 3.5–5.1)
Sodium: 138 mmol/L (ref 135–145)
Total Bilirubin: 0.9 mg/dL (ref 0.3–1.2)
Total Protein: 4.4 g/dL — ABNORMAL LOW (ref 6.5–8.1)

## 2021-06-17 LAB — CBC
HCT: 28.7 % — ABNORMAL LOW (ref 39.0–52.0)
Hemoglobin: 9.6 g/dL — ABNORMAL LOW (ref 13.0–17.0)
MCH: 33.3 pg (ref 26.0–34.0)
MCHC: 33.4 g/dL (ref 30.0–36.0)
MCV: 99.7 fL (ref 80.0–100.0)
Platelets: 173 10*3/uL (ref 150–400)
RBC: 2.88 MIL/uL — ABNORMAL LOW (ref 4.22–5.81)
RDW: 15.4 % (ref 11.5–15.5)
WBC: 7.7 10*3/uL (ref 4.0–10.5)
nRBC: 0.3 % — ABNORMAL HIGH (ref 0.0–0.2)

## 2021-06-17 LAB — MAGNESIUM
Magnesium: 1.5 mg/dL — ABNORMAL LOW (ref 1.7–2.4)
Magnesium: 1.7 mg/dL (ref 1.7–2.4)

## 2021-06-17 LAB — GLUCOSE, CAPILLARY
Glucose-Capillary: 145 mg/dL — ABNORMAL HIGH (ref 70–99)
Glucose-Capillary: 147 mg/dL — ABNORMAL HIGH (ref 70–99)
Glucose-Capillary: 153 mg/dL — ABNORMAL HIGH (ref 70–99)
Glucose-Capillary: 169 mg/dL — ABNORMAL HIGH (ref 70–99)
Glucose-Capillary: 200 mg/dL — ABNORMAL HIGH (ref 70–99)
Glucose-Capillary: 210 mg/dL — ABNORMAL HIGH (ref 70–99)
Glucose-Capillary: 244 mg/dL — ABNORMAL HIGH (ref 70–99)

## 2021-06-17 LAB — SURGICAL PATHOLOGY

## 2021-06-17 LAB — PHOSPHORUS: Phosphorus: 3.5 mg/dL (ref 2.5–4.6)

## 2021-06-17 MED ORDER — FENTANYL CITRATE (PF) 100 MCG/2ML IJ SOLN
25.0000 ug | INTRAMUSCULAR | Status: DC | PRN
  Administered 2021-06-17 – 2021-06-20 (×7): 25 ug via INTRAVENOUS
  Filled 2021-06-17 (×8): qty 2

## 2021-06-17 MED ORDER — INSULIN ASPART 100 UNIT/ML IJ SOLN
0.0000 [IU] | INTRAMUSCULAR | Status: DC
  Administered 2021-06-17: 4 [IU] via SUBCUTANEOUS
  Administered 2021-06-17: 2 [IU] via SUBCUTANEOUS
  Administered 2021-06-18 (×2): 1 [IU] via SUBCUTANEOUS
  Administered 2021-06-18: 2 [IU] via SUBCUTANEOUS
  Administered 2021-06-18 – 2021-06-19 (×2): 1 [IU] via SUBCUTANEOUS
  Administered 2021-06-19: 2 [IU] via SUBCUTANEOUS
  Administered 2021-06-19: 3 [IU] via SUBCUTANEOUS
  Administered 2021-06-19: 2 [IU] via SUBCUTANEOUS
  Administered 2021-06-19: 1 [IU] via SUBCUTANEOUS
  Administered 2021-06-19: 3 [IU] via SUBCUTANEOUS
  Administered 2021-06-20: 1 [IU] via SUBCUTANEOUS
  Administered 2021-06-20 – 2021-06-21 (×3): 2 [IU] via SUBCUTANEOUS
  Administered 2021-06-21: 5 [IU] via SUBCUTANEOUS
  Administered 2021-06-21: 3 [IU] via SUBCUTANEOUS
  Administered 2021-06-21: 4 [IU] via SUBCUTANEOUS
  Administered 2021-06-22: 1 [IU] via SUBCUTANEOUS
  Filled 2021-06-17 (×19): qty 1

## 2021-06-17 MED ORDER — LACTATED RINGERS IV SOLN: INTRAVENOUS | Status: DC

## 2021-06-17 MED ORDER — FENTANYL CITRATE (PF) 100 MCG/2ML IJ SOLN
25.0000 ug | Freq: Once | INTRAMUSCULAR | Status: AC
  Administered 2021-06-17: 25 ug via INTRAVENOUS
  Filled 2021-06-17: qty 2

## 2021-06-17 MED ORDER — MAGNESIUM SULFATE 2 GM/50ML IV SOLN
2.0000 g | Freq: Once | INTRAVENOUS | Status: AC
  Administered 2021-06-17: 2 g via INTRAVENOUS
  Filled 2021-06-17: qty 50

## 2021-06-17 NOTE — Progress Notes (Signed)
NAME:  Russell Gomez, MRN:  [id number], DOB:  Apr 15, 1961, LOS: 3 ADMISSION DATE:  06/14/2021  BRIEF SYNOPSIS  60 yo male who presented to [facility] ER on 08/7 with a 3 day hx of lower abdominal pain.  He does have a hx of chronic ETOH abuse, however reported his last drink was 08/4.   ED Course: Upon arrival to the ER pt noted to have s/sx of ETOH withdrawal requiring 1 mg of iv ativan and received 100 mg of iv thiamine.  CT Abd Pelvis revealed diverticular disease with perforation.  Therefore, General Surgery consulted and pt underwent emergent sigmoid colon resection with colostomy (Hartmann's procedure) and appendectomy.  He was subsequently admitted to the stepdown unit postop for additional workup and treatment.  However, on 08/9 hospital course complicated by severe ETOH withdrawal symptoms despite administration of ativan per CIWA protocol.  Therefore, precedex gtt ordered and PCCM team consulted to assist with plan of care.   Pertinent  Medical History  ETOH Abuse Uncontrolled Type I Diabetes Mellitus DKA GERD Goiter Hypercholesteremia HTN   Significant Hospital Events: Including procedures, antibiotic start and stop dates in addition to other pertinent events   08/7: Pt admitted to the stepdown unit s/p emergent sigmoid colon resection with colostomy (Hartmann's procedure) and appendectomy 08/9: Pt with worsening ETOH withdrawal symptoms requiring Precedex gtt PCCM team consulted   Significant Test Results:  CT Abd Pelvis 08/7>>Hyperenhancement of the wall of the terminal ileum with small amount of free fluid and punctate focus of free air in the ventral abdomen, suggesting acute Crohn's disease. Multifocal punctate free air within the peritoneal cavity indicating perforation at an unknown location. Submucosal fat deposition within the proximal ascending colon and descending colon, consistent with chronic inflammatory bowel disease.  History of Present Illness:  Severe  encephalopathy On oxygen Increased WOB High risk for intubation and cardiac arrest +signs of aspiration      Antimicrobials:   Antibiotics Given (last 72 hours)     Date/Time Action Medication Dose Rate   06/14/21 1346 New Bag/Given   piperacillin-tazobactam (ZOSYN) IVPB 3.375 g 3.375 g 12.5 mL/hr   06/14/21 2130 New Bag/Given   piperacillin-tazobactam (ZOSYN) IVPB 3.375 g 3.375 g 12.5 mL/hr   06/15/21 0529 New Bag/Given   piperacillin-tazobactam (ZOSYN) IVPB 3.375 g 3.375 g 12.5 mL/hr   06/15/21 1342 New Bag/Given   piperacillin-tazobactam (ZOSYN) IVPB 3.375 g 3.375 g 12.5 mL/hr   06/15/21 2256 New Bag/Given   piperacillin-tazobactam (ZOSYN) IVPB 3.375 g 3.375 g 12.5 mL/hr   06/16/21 0512 New Bag/Given   piperacillin-tazobactam (ZOSYN) IVPB 3.375 g 3.375 g 12.5 mL/hr   06/16/21 1836 New Bag/Given  [no IV access]   piperacillin-tazobactam (ZOSYN) IVPB 3.375 g 3.375 g 12.5 mL/hr   06/17/21 0159 New Bag/Given   piperacillin-tazobactam (ZOSYN) IVPB 3.375 g 3.375 g 12.5 mL/hr          Objective   Blood pressure 114/63, pulse 70, temperature 98.4 F (36.9 C), temperature source Axillary, resp. rate 16, height '6\' 2"'$  (1.88 m), weight 97.1 kg, SpO2 100 %.        Intake/Output Summary (Last 24 hours) at 06/17/2021 0710 Last data filed at 06/17/2021 0708 Gross per 24 hour  Intake 1814.51 ml  Output 1730 ml  Net 84.51 ml   Filed Weights   06/13/21 1932  Weight: 97.1 kg      REVIEW OF SYSTEMS  PATIENT IS UNABLE TO PROVIDE COMPLETE REVIEW OF SYSTEMS DUE TO SEVERE CRITICAL ILLNESS AND TOXIC  METABOLIC ENCEPHALOPATHY   PHYSICAL EXAMINATION:  GENERAL:critically ill appearing, +resp distress HEAD: Normocephalic, atraumatic.  EYES: Pupils equal, round, reactive to light.  No scleral icterus.  MOUTH: Moist mucosal membrane. NECK: Supple. PULMONARY: +rhonchi, +wheezing CARDIOVASCULAR: S1 and S2. Regular rate and rhythm. No murmurs, rubs, or gallops.   GASTROINTESTINAL: Soft, nontender, -distended. Positive bowel sounds.  MUSCULOSKELETAL: No swelling, clubbing, or edema.  NEUROLOGIC: obtunded SKIN:intact,warm,dry     ASSESSMENT AND PLAN SYNOPSIS  60 yo WM with severe toxic metabolic encephalopathy due to severe ETOH withdrawal/DT's with severe hypoxic and +aspiration pneumonia s/p bowel PERF with colostomy  Severe ACUTE Hypoxic and Hypercapnic Respiratory Failure Plan for intubation   +aspiration pneumonia On IV abx    SEVERE ALCOHOL WITHDRAWAL -Therapy with Thiamine and MVI  CARDIAC ICU monitoring   ACUTE KIDNEY INJURY/Renal Failure -continue Foley Catheter-assess need -Avoid nephrotoxic agents -Follow urine output, BMP -Ensure adequate renal perfusion, optimize oxygenation -Renal dose medications   Intake/Output Summary (Last 24 hours) at 06/17/2021 0710 Last data filed at 06/17/2021 0708 Gross per 24 hour  Intake 1814.51 ml  Output 1730 ml  Net 84.51 ml     NEUROLOGY Acute toxic metabolic encephalopathy Plan for intubation   ENDO - ICU hypoglycemic\Hyperglycemia protocol -check FSBS per protocol   GI GI PROPHYLAXIS as indicated  NUTRITIONAL STATUS DIET-->will start TF's as tolerated Constipation protocol as indicated   ELECTROLYTES -follow labs as needed -replace as needed -pharmacy consultation and following     Best practice (right click and "Reselect all SmartList Selections" daily)  Diet:  NPO Pain/Anxiety/Delirium protocol (if indicated): Yes (RASS goal -2) VAP protocol (if indicated): Yes DVT prophylaxis: Subcutaneous Heparin GI prophylaxis: PPI PICC line 8/9/ Foley:  yes Mobility:  bed rest  Code Status:  FULL CODE Disposition: ICU  Labs   CBC: Recent Labs  Lab 06/13/21 2005 06/14/21 0600 06/14/21 0846 06/16/21 0431 06/17/21 0447  WBC 11.6* 11.5* 12.6* 11.7* 7.7  HGB 14.5 11.1* 11.9* 11.6* 9.6*  HCT 41.7 31.7* 35.0* 33.8* 28.7*  MCV 97.9 99.4 100.3* 98.5 99.7   PLT 369 251 292 281 [id number]    Basic Metabolic Panel: Recent Labs  Lab 06/14/21 0846 06/14/21 1720 06/15/21 0456 06/16/21 0431 06/16/21 1941 06/17/21 0447  NA 135 138 139 137  --  138  K 3.2* 3.3* 3.1* 3.4* 3.3* 3.6  CL 98 102 105 103  --  104  CO2 20* '31 30 27  '$ --  30  GLUCOSE 474* 142* 101* 69*  --  206*  BUN '12 15 18 17  '$ --  12  CREATININE 1.22 1.06 0.97 0.75  --  0.62  CALCIUM 7.7* 7.1* 6.5* 7.2*  --  7.3*  MG 1.5* 1.8 1.6* 1.9  --  1.5*  PHOS 5.4* 3.8  --  2.1* 3.7 3.5   GFR: Estimated Creatinine Clearance: 117 mL/min (by C-G formula based on SCr of 0.62 mg/dL). Recent Labs  Lab 06/14/21 0348 06/14/21 0600 06/14/21 0846 06/15/21 0800 06/16/21 0431 06/17/21 0447  WBC  --  11.5* 12.6*  --  11.7* 7.7  LATICACIDVEN 4.5*  --   --  1.0  --   --     Liver Function Tests: Recent Labs  Lab 06/13/21 2005 06/14/21 0846 06/16/21 0431 06/17/21 0447  AST 58* 54* 43* 58*  ALT 32 '23 19 22  '$ ALKPHOS 200* 153* 178* 223*  BILITOT 2.0* 1.9* 1.3* 0.9  PROT 5.8* 4.8* 5.1* 4.4*  ALBUMIN 2.4* 1.8* 1.6* 1.4*   Recent Labs  Lab 06/13/21 2005  LIPASE 18   No results for input(s): AMMONIA in the last 168 hours.  ABG    Component Value Date/Time   HCO3 27.3 06/13/2021 2004   O2SAT 85.1 06/13/2021 2004     Coagulation Profile: Recent Labs  Lab 06/14/21 0348 06/14/21 0846  INR 1.3* 1.2    Cardiac Enzymes: No results for input(s): CKTOTAL, CKMB, CKMBINDEX, TROPONINI in the last 168 hours.  HbA1C: Hgb A1c MFr Bld  Date/Time Value Ref Range Status  05/28/2021 12:23 AM 8.2 (H) 4.8 - 5.6 % Final    Comment:    (NOTE) Pre diabetes:          5.7%-6.4%  Diabetes:              >6.4%  Glycemic control for   <7.0% adults with diabetes   12/27/2020 05:21 AM 7.4 (H) 4.8 - 5.6 % Final    Comment:    (NOTE) Pre diabetes:          5.7%-6.4%  Diabetes:              >6.4%  Glycemic control for   <7.0% adults with diabetes     CBG: Recent Labs  Lab  06/16/21 1520 06/16/21 1547 06/16/21 1942 06/17/21 0040 06/17/21 0451  GLUCAP 64* 79 132* 169* 147*    Allergies Allergies  Allergen Reactions   Insulin Aspart Other (See Comments)    CELLULITIS   Spironolactone Nausea Only and Rash   Hydrochlorothiazide Itching and Rash   Morphine Other (See Comments)    "MAKES ME MEAN"       DVT/GI PRX  assessed I Assessed the need for Labs I Assessed the need for Foley I Assessed the need for Central Venous Line Family Discussion when available I Assessed the need for Mobilization I made an Assessment of medications to be adjusted accordingly Safety Risk assessment completed  CASE DISCUSSED IN MULTIDISCIPLINARY ROUNDS WITH ICU TEAM     Critical Care Time devoted to patient care services described in this note is 65  minutes.  Critical care was necessary to treat or prevent imminent or life-threatening deterioration.   PATIENT WITH VERY POOR PROGNOSIS I ANTICIPATE PROLONGED ICU LOS  Patient with Multiorgan failure and at high risk for cardiac arrest and death.    Corrin Parker, M.D.  [facility]  Medical Director [place] Director [facility] Cardio-Pulmonary Department

## 2021-06-17 NOTE — Progress Notes (Signed)
Patient ID: Russell Gomez, male   DOB: [birth year], 61 y.o.   MRN: [id number]     [place] Hospital Day(s): 3.   Interval History: Patient seen and examined.  Patient on Precedex due to withdrawal symptoms.  Patient disoriented while he is awake.  Now with Precedex is sleepy.  The colostomy started working again with liquid stool in the colostomy bag.  Vital signs in last 24 hours: [min-max] current  Temp:  [97.5 F (36.4 C)-98.6 F (37 C)] 98.6 F (37 C) (08/10 0800) Pulse Rate:  [50-100] 79 (08/10 1005) Resp:  [14-26] 22 (08/10 0900) BP: (85-138)/(49-97) 130/68 (08/10 1005) SpO2:  [89 %-100 %] 98 % (08/10 1005) Arterial Line BP: (10-125)/(-40-72) 10/-40 (08/09 1400)     Height: '6\' 2"'$  (188 cm) Weight: 97.1 kg BMI (Calculated): 27.46   Physical Exam:  Constitutional: Drowsy Respiratory: breathing non-labored at rest  Cardiovascular: regular rate and sinus rhythm  Gastrointestinal: soft, non-tender, and non-distended.  Colostomy pink and patent  Labs:  CBC Latest Ref Rng & Units 06/17/2021 06/16/2021 06/14/2021  WBC 4.0 - 10.5 K/uL 7.7 11.7(H) 12.6(H)  Hemoglobin 13.0 - 17.0 g/dL 9.6(L) 11.6(L) 11.9(L)  Hematocrit 39.0 - 52.0 % 28.7(L) 33.8(L) 35.0(L)  Platelets 150 - 400 K/uL 173 281 292   CMP Latest Ref Rng & Units 06/17/2021 06/16/2021 06/16/2021  Glucose 70 - 99 mg/dL 206(H) - 69(L)  BUN 6 - 20 mg/dL 12 - 17  Creatinine 0.61 - 1.24 mg/dL 0.62 - 0.75  Sodium 135 - 145 mmol/L 138 - 137  Potassium 3.5 - 5.1 mmol/L 3.6 3.3(L) 3.4(L)  Chloride 98 - 111 mmol/L 104 - 103  CO2 22 - 32 mmol/L 30 - 27  Calcium 8.9 - 10.3 mg/dL 7.3(L) - 7.2(L)  Total Protein 6.5 - 8.1 g/dL 4.4(L) - 5.1(L)  Total Bilirubin 0.3 - 1.2 mg/dL 0.9 - 1.3(H)  Alkaline Phos 38 - 126 U/L 223(H) - 178(H)  AST 15 - 41 U/L 58(H) - 43(H)  ALT 0 - 44 U/L 22 - 19    Imaging studies: No new pertinent imaging studies   Assessment/Plan:  60 y.o. male with diverticulitis with perforation 3 Day Post-Op s/p  Hartman's procedure, complicated by pertinent comorbidities including type 1 diabetes, A. fib and on anticoagulation, chronic alcohol abuse.  Patient today with worsening withdrawal symptoms requiring Precedex.  Otherwise the colostomy seems to be working again with liquid stool in the bag.  We will keep him n.p.o. due to Precedex and severe disorientation when awake.  White blood cell decreasing now within normal limits.  No critical electrolyte disturbance at the moment.  Patient will continue with close monitoring of his electrolytes.  Appreciate hospitalist and ICU team for the management of this patient medical comorbidities.    Arnold Long, MD

## 2021-06-17 NOTE — Consult Note (Addendum)
[facility] Nurse ostomy follow up Pt had colostomy surgery performed 8/7. He is currently going though alcohol withdrawal and is in ICU, restrained with mittens and a sitter at the bedside.  He was not awake or ready for teaching.   Stoma type/location: Stoma is 90% red and viable, flush with skin level. Small amt pink drainage leaking from stoma edge at 7:00 o'clock Stomal assessment/size: 1 3/4 inches and oval, 10% necrotic edge to stoma from 4:00 o'clock to 6:00 o'clock Peristomal assessment: intact skin surrounding Output: small amt brown liquid stool Ostomy pouching: 2pc. (Barrier ring, # [id number], wafer # 2, pouch # [id number]) Education provided:  Applied barrier ring to attempt to maintain a seal and 2 piece pouching system.  Pt was not awake and was unable to participate in the teaching session. Extra supplies and teaching materials left at the bedside.  Enrolled patient in [place] Discharge program: Yes, previously. [place] team will begin educational sessions when pt is stable and out of ICU.  Julien Girt MSN, RN, [place], [place], [place]

## 2021-06-17 NOTE — Plan of Care (Signed)
  Problem: Education: Goal: Knowledge of General Education information will improve Description: Including pain rating scale, medication(s)/side effects and non-pharmacologic comfort measures Outcome: Progressing   Problem: Health Behavior/Discharge Planning: Goal: Ability to manage health-related needs will improve Outcome: Not Progressing   Problem: Clinical Measurements: Goal: Ability to maintain clinical measurements within normal limits will improve Outcome: Progressing Goal: Will remain free from infection Outcome: Progressing Goal: Diagnostic test results will improve Outcome: Progressing Goal: Respiratory complications will improve Outcome: Progressing Goal: Cardiovascular complication will be avoided Outcome: Progressing   Problem: Coping: Goal: Level of anxiety will decrease Outcome: Progressing   Problem: Nutrition: Goal: Adequate nutrition will be maintained Outcome: Not Progressing Note: NPO d/t delerium and agitation.   Problem: Elimination: Goal: Will not experience complications related to bowel motility Outcome: Progressing Goal: Will not experience complications related to urinary retention Outcome: Progressing   Problem: Pain Managment: Goal: General experience of comfort will improve Outcome: Progressing   Problem: Clinical Measurements: Goal: Ability to maintain clinical measurements within normal limits will improve Outcome: Progressing Goal: Postoperative complications will be avoided or minimized Outcome: Progressing   Problem: Skin Integrity: Goal: Demonstration of wound healing without infection will improve Outcome: Progressing   Problem: Education: Goal: Knowledge of ostomy care will improve Outcome: Progressing Goal: Understanding of discharge needs will improve Outcome: Progressing   Problem: Bowel/Gastric/Urinary: Goal: Gastrointestinal status for postoperative course will improve Outcome: Progressing   Problem: Coping: Goal:  Coping ability will improve Outcome: Progressing   Problem: Health Behavior/Discharge Planning: Goal: Ability to manage health-related needs will improve Outcome: Progressing   Problem: Fluid Volume: Goal: Ability to achieve a balanced intake and output will improve Outcome: Progressing   Problem: Nutrition: Goal: Will attain and maintain optimal nutritional status will improve Outcome: Progressing   Problem: Skin Integrity: Goal: Will show signs of wound healing Outcome: Progressing Goal: Risk for impaired skin integrity will decrease Outcome: Progressing   Problem: Health Behavior/Discharge Planning: Goal: Ability to identify and utilize available resources and services will improve Outcome: Progressing Goal: Ability to manage health-related needs will improve Outcome: Progressing   Problem: Respiratory: Goal: Will regain and/or maintain adequate ventilation Outcome: Progressing

## 2021-06-17 NOTE — Progress Notes (Signed)
Patient BP was dropping, noted in the [id number] systolic, so the precedex drip was decreased.  Over about a period of 30 minutes, patient began thrashing about in the bed and trying to get up. Patient was able to follow commands and was alert and oriented to self. He also tried to swatting his hands at this Probation officer and was told not to do that and he stated that he wasn't trying to do that. Mitts on both hands due to pulling out lines. Given ativan per CIWA x1. It was ineffective. Precedex was again increased and patient has tolerated well. Dressing to abdomen changed twice due to stool soilage. Serosanguinous drainage noted in the JP drain. Foley in place draining amber urine. O2 sats dropped in the mid 80s and patient was switched from nasal cannula to non-rebreather. Tolerating well and sats are at 100%. Patient is resting calmly bed at this time. Will continue to monitor.

## 2021-06-17 NOTE — Progress Notes (Signed)
Inpatient Diabetes Program Recommendations  AACE/ADA: New Consensus Statement on Inpatient Glycemic Control  Target Ranges:  Prepandial:   less than 140 mg/dL      Peak postprandial:   less than 180 mg/dL (1-2 hours)      Critically ill patients:  140 - 180 mg/dL  Results for Russell Gomez, Russell Gomez (MRN [id number]) as of 06/17/2021 07:30  Ref. Range 06/16/2021 08:12 06/16/2021 08:29 06/16/2021 10:54 06/16/2021 15:20 06/16/2021 15:47 06/16/2021 19:42 06/17/2021 00:40 06/17/2021 04:51  Glucose-Capillary Latest Ref Range: 70 - 99 mg/dL 45 (L) 118 (H) 120 (H) 64 (L) 79 132 (H) 169 (H) 147 (H)   Review of Glycemic Control   Diabetes history: DM1 (makes NO insulin; requires basal, correction, and carb coverage insulin) Outpatient Diabetes medications: Levemir 15 units daily, Regular 5-9 units TID with meals Current orders for Inpatient glycemic control: CBGs   Inpatient Diabetes Program Recommendations:     Insulin:  Please consider ordering Novolog 0-6 units Q4H.  Thanks, Barnie Alderman, RN, MSN, CDE Diabetes Coordinator Inpatient Diabetes Program [phone] (Team Pager from 8am to 5pm)

## 2021-06-18 DIAGNOSIS — K631 Perforation of intestine (nontraumatic): Secondary | ICD-10-CM | POA: Diagnosis not present

## 2021-06-18 LAB — MAGNESIUM: Magnesium: 1.5 mg/dL — ABNORMAL LOW (ref 1.7–2.4)

## 2021-06-18 LAB — GLUCOSE, CAPILLARY
Glucose-Capillary: 158 mg/dL — ABNORMAL HIGH (ref 70–99)
Glucose-Capillary: 124 mg/dL — ABNORMAL HIGH (ref 70–99)
Glucose-Capillary: 147 mg/dL — ABNORMAL HIGH (ref 70–99)
Glucose-Capillary: 185 mg/dL — ABNORMAL HIGH (ref 70–99)
Glucose-Capillary: 250 mg/dL — ABNORMAL HIGH (ref 70–99)
Glucose-Capillary: 199 mg/dL — ABNORMAL HIGH (ref 70–99)

## 2021-06-18 LAB — CBC WITH DIFFERENTIAL/PLATELET
Abs Immature Granulocytes: 1.31 10*3/uL — ABNORMAL HIGH (ref 0.00–0.07)
Basophils Absolute: 0 10*3/uL (ref 0.0–0.1)
Basophils Relative: 0 %
Eosinophils Absolute: 0.3 10*3/uL (ref 0.0–0.5)
Eosinophils Relative: 3 %
HCT: 30.8 % — ABNORMAL LOW (ref 39.0–52.0)
Hemoglobin: 10.6 g/dL — ABNORMAL LOW (ref 13.0–17.0)
Immature Granulocytes: 14 %
Lymphocytes Relative: 15 %
Lymphs Abs: 1.4 10*3/uL (ref 0.7–4.0)
MCH: 34.2 pg — ABNORMAL HIGH (ref 26.0–34.0)
MCHC: 34.4 g/dL (ref 30.0–36.0)
MCV: 99.4 fL (ref 80.0–100.0)
Monocytes Absolute: 1 10*3/uL (ref 0.1–1.0)
Monocytes Relative: 11 %
Neutro Abs: 5.3 10*3/uL (ref 1.7–7.7)
Neutrophils Relative %: 57 %
Platelets: 175 10*3/uL (ref 150–400)
RBC: 3.1 MIL/uL — ABNORMAL LOW (ref 4.22–5.81)
RDW: 15.7 % — ABNORMAL HIGH (ref 11.5–15.5)
Smear Review: NORMAL
WBC: 9.4 10*3/uL (ref 4.0–10.5)
nRBC: 0.2 % (ref 0.0–0.2)

## 2021-06-18 LAB — BASIC METABOLIC PANEL
Anion gap: 3 — ABNORMAL LOW (ref 5–15)
BUN: 9 mg/dL (ref 6–20)
CO2: 28 mmol/L (ref 22–32)
Calcium: 7.1 mg/dL — ABNORMAL LOW (ref 8.9–10.3)
Chloride: 110 mmol/L (ref 98–111)
Creatinine, Ser: 0.6 mg/dL — ABNORMAL LOW (ref 0.61–1.24)
GFR, Estimated: >60 mL/min (ref >60)
Glucose, Bld: 162 mg/dL — ABNORMAL HIGH (ref 70–99)
Potassium: 3.7 mmol/L (ref 3.5–5.1)
Sodium: 141 mmol/L (ref 135–145)

## 2021-06-18 LAB — TYPE AND SCREEN
ABO/RH(D): O POS
Antibody Screen: NEGATIVE
Unit division: 0
Unit division: 0

## 2021-06-18 LAB — BPAM RBC
Blood Product Expiration Date: 202209082359
Blood Product Expiration Date: 202209082359
Unit Type and Rh: 5100
Unit Type and Rh: 5100

## 2021-06-18 LAB — PHOSPHORUS: Phosphorus: 2.6 mg/dL (ref 2.5–4.6)

## 2021-06-18 MED ORDER — DIAZEPAM 5 MG PO TABS
2.5000 mg | ORAL_TABLET | Freq: Four times a day (QID) | ORAL | Status: DC
  Administered 2021-06-18 – 2021-06-24 (×24): 2.5 mg via ORAL
  Filled 2021-06-18 (×25): qty 1

## 2021-06-18 MED ORDER — MAGNESIUM SULFATE 4 GM/100ML IV SOLN
4.0000 g | Freq: Once | INTRAVENOUS | Status: AC
  Administered 2021-06-18: 4 g via INTRAVENOUS
  Filled 2021-06-18: qty 100

## 2021-06-18 MED ORDER — LACTATED RINGERS IV BOLUS
500.0000 mL | Freq: Once | INTRAVENOUS | Status: AC
  Administered 2021-06-18: 500 mL via INTRAVENOUS

## 2021-06-18 MED ORDER — MIDODRINE HCL 5 MG PO TABS: 10.0000 mg | ORAL_TABLET | Freq: Three times a day (TID) | ORAL | Status: DC

## 2021-06-18 MED ORDER — ADULT MULTIVITAMIN W/MINERALS CH
1.0000 | ORAL_TABLET | Freq: Every day | ORAL | Status: DC
  Administered 2021-06-20 – 2021-06-25 (×6): 1 via ORAL
  Filled 2021-06-18 (×6): qty 1

## 2021-06-18 MED ORDER — NEPRO/CARBSTEADY PO LIQD
237.0000 mL | Freq: Three times a day (TID) | ORAL | Status: DC
  Administered 2021-06-18 – 2021-06-21 (×2): 237 mL via ORAL

## 2021-06-18 MED ORDER — MIDODRINE HCL 5 MG PO TABS
10.0000 mg | ORAL_TABLET | Freq: Three times a day (TID) | ORAL | Status: DC
  Administered 2021-06-18 – 2021-06-24 (×17): 10 mg via ORAL
  Filled 2021-06-18 (×18): qty 2

## 2021-06-18 NOTE — Progress Notes (Signed)
Initial Nutrition Assessment  DOCUMENTATION CODES:   Not applicable  INTERVENTION:   Nepro Shake po TID, each supplement provides 425 kcal and 19 grams protein  MVI, thiamine and folic acid po daily   Pt at high refeed risk; recommend monitor potassium, magnesium and phosphorus labs daily until stable  If patient is unable to take in adequate nutrition via oral intake, recommend placement of nasogastric tube and nutrition support  Osmolite 1.5@70ml /hr + ProSource TF 25m BID via tube + free water flushes [id number] 4 hours  Regimen provides 2600kcal/day, 127g/day protein and [id number] free water   NUTRITION DIAGNOSIS:   Inadequate oral intake related to acute illness as evidenced by other (comment) (pt on clear liquid diet).  GOAL:   Patient will meet greater than or equal to 90% of their needs -not met   MONITOR:   PO intake, Supplement acceptance, Diet advancement, Labs, Weight trends, Skin, I & O's  ASSESSMENT:   59 4o male with h/o type 1 DM (diagnosed age [age]yHTN, etoh abuse, Afib, GERD and AAA who is admitted with severe sepsis secondary to perforated diverticulitis now s/p Hartmann's 8/7  Pt developed nausea and vomiting on 8/9; KUB noted ileus. Pt made NPO on 8/9 and has remained NPO until today; pt now initiated on a full liquid diet. Pt has become increasingly more agitated, pt trying to hit staff today. RD will add supplements to help pt meet his estimated needs. Pt is at refeed risk. If patient is unable to take in adequate oral intake, recommend nasogastric tube placement and feeds. No new weight since admission; will request daily weights. Pt is having some stool via his ostomy today.   Medications reviewed and include: lovenox, folic acid, nicotine, insulin, MVI, miralax, protonix, thiamine, zosyn, LRS @75ml /hr   Labs reviewed: K 3.7 wnl, creat 0.60(L). P 2.6 wnl, Mg 1.5(L) Hgb 10.6(L), Hct 30.8(L) Cbgs- 158, 124 x 24 hrs  Diet Order:    Diet Order              Diet NPO time specified  Diet effective now                  EDUCATION NEEDS:   Education needs have been addressed  Skin:  Skin Assessment: Reviewed RN Assessment (incision abdomen)  Last BM:  8/11- [id number] ostomy  Height:   Ht Readings from Last 1 Encounters:  06/13/21 6' 2"  (1.88 m)    Weight:   Wt Readings from Last 1 Encounters:  06/13/21 97.1 kg    Ideal Body Weight:  86.3 kg  BMI:  Body mass index is 27.48 kg/m.  Estimated Nutritional Needs:   Kcal:  2300-2600kcal/day  Protein:  115-130g/day  Fluid:  2.6-2.9L/day  CaseKoleen Distance RD, LDN Please refer to [facility] RD and/or RD on-call/weekend/after hours pager

## 2021-06-18 NOTE — Progress Notes (Signed)
Patient ID: Russell Gomez, male   DOB: [birth year], 60 y.o.   MRN: [id number]     [facility] Hospital Day(s): 4.   Interval History: Patient seen and examined.  This morning when I saw the patient even though he was sleepy he was calm and arousable.  As per nurse no issues during the night.  There has been no more episode of nausea or vomiting.  There has been stool in the colostomy bag.  Patient still on Precedex drip.  Vital signs in last 24 hours: [min-max] current  Temp:  [97.7 F (36.5 C)-98.2 F (36.8 C)] 98.2 F (36.8 C) (08/11 0800) Pulse Rate:  [48-104] 65 (08/11 1100) Resp:  [20] 20 (08/11 0800) BP: (108-127)/(57-81) 111/73 (08/11 1100) SpO2:  [80 %-100 %] 97 % (08/11 1100)     Height: '6\' 2"'$  (188 cm) Weight: 97.1 kg BMI (Calculated): 27.46   Physical Exam:  Constitutional: Sleepy but arousable Respiratory: breathing non-labored at rest  Cardiovascular: regular rate and sinus rhythm  Gastrointestinal: soft, non-tender, and non-distended.  Wound is dry and clean  Labs:  CBC Latest Ref Rng & Units 06/18/2021 06/17/2021 06/16/2021  WBC 4.0 - 10.5 K/uL 9.4 7.7 11.7(H)  Hemoglobin 13.0 - 17.0 g/dL 10.6(L) 9.6(L) 11.6(L)  Hematocrit 39.0 - 52.0 % 30.8(L) 28.7(L) 33.8(L)  Platelets 150 - 400 K/uL 175 173 281   CMP Latest Ref Rng & Units 06/18/2021 06/17/2021 06/16/2021  Glucose 70 - 99 mg/dL 162(H) 206(H) -  BUN 6 - 20 mg/dL 9 12 -  Creatinine 0.61 - 1.24 mg/dL 0.60(L) 0.62 -  Sodium 135 - 145 mmol/L 141 138 -  Potassium 3.5 - 5.1 mmol/L 3.7 3.6 3.3(L)  Chloride 98 - 111 mmol/L 110 104 -  CO2 22 - 32 mmol/L 28 30 -  Calcium 8.9 - 10.3 mg/dL 7.1(L) 7.3(L) -  Total Protein 6.5 - 8.1 g/dL - 4.4(L) -  Total Bilirubin 0.3 - 1.2 mg/dL - 0.9 -  Alkaline Phos 38 - 126 U/L - 223(H) -  AST 15 - 41 U/L - 58(H) -  ALT 0 - 44 U/L - 22 -    Imaging studies: No new pertinent imaging studies   Assessment/Plan:  60 y.o. male with diverticulitis with perforation 4 Day Post-Op  s/p Hartman's procedure, complicated by pertinent comorbidities including type 1 diabetes, A. fib and on anticoagulation, chronic alcohol abuse.  Patient having a little more episode of less aggressive uncooperative time but still having some disorientation and aggressiveness.  ICU team will try to wean Precedex and start her volume.  From a standpoint patient is awake that he can protect his airway he can start with full liquid diet.  I agree to continue IV antibiotic therapy.  I personally change the dressing without any sign of infection.  No contraindication to get out of bed from a standpoint.  Appreciate hospitalist and ICU team for the management of patient's medical comorbidities.  Arnold Long, MD

## 2021-06-18 NOTE — Progress Notes (Signed)
NAME:  Russell Gomez, MRN:  [id number], DOB:  02-04-1961, LOS: 4 ADMISSION DATE:  06/14/2021  BRIEF SYNOPSIS  60 yo male who presented to [facility] ER on 08/7 with a 3 day hx of lower abdominal pain.  He does have a hx of chronic ETOH abuse, however reported his last drink was 08/4.   ED Course: Upon arrival to the ER pt noted to have s/sx of ETOH withdrawal requiring 1 mg of iv ativan and received 100 mg of iv thiamine.  CT Abd Pelvis revealed diverticular disease with perforation.  Therefore, General Surgery consulted and pt underwent emergent sigmoid colon resection with colostomy (Hartmann's procedure) and appendectomy.  He was subsequently admitted to the stepdown unit postop for additional workup and treatment.  However, on 08/9 hospital course complicated by severe ETOH withdrawal symptoms despite administration of ativan per CIWA protocol.  Therefore, precedex gtt ordered and PCCM team consulted to assist with plan of care.   Pertinent  Medical History  ETOH Abuse Uncontrolled Type I Diabetes Mellitus DKA GERD Goiter Hypercholesteremia HTN   Significant Hospital Events: Including procedures, antibiotic start and stop dates in addition to other pertinent events   08/7: Pt admitted to the stepdown unit s/p emergent sigmoid colon resection with colostomy (Hartmann's procedure) and appendectomy 08/9: Pt with worsening ETOH withdrawal symptoms requiring Precedex gtt PCCM team consulted 8/11 remains on precedex, high risk for intubation   Significant Test Results:  CT Abd Pelvis 08/7>>Hyperenhancement of the wall of the terminal ileum with small amount of free fluid and punctate focus of free air in the ventral abdomen, suggesting acute Crohn's disease. Multifocal punctate free air within the peritoneal cavity indicating perforation at an unknown location. Submucosal fat deposition within the proximal ascending colon and descending colon, consistent with chronic inflammatory bowel  disease.  History of Present Illness:  Severe encephalopathy On minimal oxygen High risk for intubation and cardiac arrest       Antimicrobials:   Antibiotics Given (last 72 hours)     Date/Time Action Medication Dose Rate   06/15/21 1342 New Bag/Given   piperacillin-tazobactam (ZOSYN) IVPB 3.375 g 3.375 g 12.5 mL/hr   06/15/21 2256 New Bag/Given   piperacillin-tazobactam (ZOSYN) IVPB 3.375 g 3.375 g 12.5 mL/hr   06/16/21 0512 New Bag/Given   piperacillin-tazobactam (ZOSYN) IVPB 3.375 g 3.375 g 12.5 mL/hr   06/16/21 1836 New Bag/Given  [no IV access]   piperacillin-tazobactam (ZOSYN) IVPB 3.375 g 3.375 g 12.5 mL/hr   06/17/21 0159 New Bag/Given   piperacillin-tazobactam (ZOSYN) IVPB 3.375 g 3.375 g 12.5 mL/hr   06/17/21 1044 New Bag/Given   piperacillin-tazobactam (ZOSYN) IVPB 3.375 g 3.375 g 12.5 mL/hr   06/17/21 1711 New Bag/Given   piperacillin-tazobactam (ZOSYN) IVPB 3.375 g 3.375 g 12.5 mL/hr   06/18/21 0217 New Bag/Given   piperacillin-tazobactam (ZOSYN) IVPB 3.375 g 3.375 g 12.5 mL/hr          Objective   Blood pressure 120/73, pulse 66, temperature 98 F (36.7 C), resp. rate (!) 22, height '6\' 2"'$  (1.88 m), weight 97.1 kg, SpO2 100 %.        Intake/Output Summary (Last 24 hours) at 06/18/2021 0734 Last data filed at 06/18/2021 0602 Gross per 24 hour  Intake 2267.13 ml  Output 1805 ml  Net 462.13 ml    Filed Weights   06/13/21 1932  Weight: 97.1 kg    REVIEW OF SYSTEMS  PATIENT IS UNABLE TO PROVIDE COMPLETE REVIEW OF SYSTEMS DUE TO TOXIC METABOLIC ENCEPHALOPATHY  PHYSICAL EXAMINATION:  GENERAL:critically ill appearing HEAD: Normocephalic, atraumatic.  EYES: Pupils equal, round, reactive to light.  No scleral icterus.  MOUTH: Moist mucosal membrane. NECK: Supple.  PULMONARY: +rhonchi,  CARDIOVASCULAR: S1 and S2. Regular rate  GASTROINTESTINAL: Soft, +distended. Honeycomb bandage intact colostomy in place  MUSCULOSKELETAL: No swelling,  clubbing, or edema.  NEUROLOGIC: obtunded SKIN:intact,warm,dry     ASSESSMENT AND PLAN SYNOPSIS  60 yo WM with severe toxic metabolic encephalopathy due to severe ETOH withdrawal/DT's with severe hypoxic and +aspiration pneumonia s/p bowel PERF with colostomy    SEVERE ALCOHOL WITHDRAWAL -Therapy with Thiamine and MVI -CIWA Protocol -Precedex as needed -High risk for intubation  -high risk for aspiration   +aspiration pneumonia On IV abx   CARDIAC ICU monitoring   NEUROLOGY Acute toxic metabolic encephalopathy Wean off precedex  ENDO - ICU hypoglycemic\Hyperglycemia protocol -check FSBS per protocol  ELECTROLYTES -follow labs as needed -replace as needed -pharmacy consultation and following    GI GI PROPHYLAXIS as indicated  NUTRITIONAL STATUS Constipation protocol as indicated Follow up [place] practice (right click and "Reselect all SmartList Selections" daily)  Diet:  NPO Pain/Anxiety/Delirium protocol (if indicated): Yes (RASS goal -2) VAP protocol (if indicated): Yes DVT prophylaxis: Subcutaneous Heparin GI prophylaxis: PPI PICC line 8/9/ Foley:  yes Mobility:  bed rest  Code Status:  FULL CODE Disposition: ICU  Labs   CBC: Recent Labs  Lab 06/14/21 0600 06/14/21 0846 06/16/21 0431 06/17/21 0447 06/18/21 0447  WBC 11.5* 12.6* 11.7* 7.7 9.4  NEUTROABS  --   --   --   --  5.3  HGB 11.1* 11.9* 11.6* 9.6* 10.6*  HCT 31.7* 35.0* 33.8* 28.7* 30.8*  MCV 99.4 100.3* 98.5 99.7 99.4  PLT 251 292 281 173 175     Basic Metabolic Panel: Recent Labs  Lab 06/14/21 1720 06/15/21 0456 06/16/21 0431 06/16/21 1941 06/17/21 0447 06/17/21 1900 06/18/21 0447  NA 138 139 137  --  138  --  141  K 3.3* 3.1* 3.4* 3.3* 3.6  --  3.7  CL 102 105 103  --  104  --  110  CO2 '31 30 27  '$ --  30  --  28  GLUCOSE 142* 101* 69*  --  206*  --  162*  BUN '15 18 17  '$ --  12  --  9  CREATININE 1.06 0.97 0.75  --  0.62  --  0.60*  CALCIUM  7.1* 6.5* 7.2*  --  7.3*  --  7.1*  MG 1.8 1.6* 1.9  --  1.5* 1.7 1.5*  PHOS 3.8  --  2.1* 3.7 3.5  --  2.6    GFR: Estimated Creatinine Clearance: 117 mL/min (A) (by C-G formula based on SCr of 0.6 mg/dL (L)). Recent Labs  Lab 06/14/21 0348 06/14/21 0600 06/14/21 0846 06/15/21 0800 06/16/21 0431 06/17/21 0447 06/18/21 0447  WBC  --    < > 12.6*  --  11.7* 7.7 9.4  LATICACIDVEN 4.5*  --   --  1.0  --   --   --    < > = values in this interval not displayed.     Liver Function Tests: Recent Labs  Lab 06/13/21 2005 06/14/21 0846 06/16/21 0431 06/17/21 0447  AST 58* 54* 43* 58*  ALT 32 '23 19 22  '$ ALKPHOS 200* 153* 178* 223*  BILITOT 2.0* 1.9* 1.3* 0.9  PROT 5.8* 4.8* 5.1* 4.4*  ALBUMIN 2.4* 1.8* 1.6* 1.4*  Recent Labs  Lab 06/13/21 2005  LIPASE 18    No results for input(s): AMMONIA in the last 168 hours.  ABG    Component Value Date/Time   HCO3 27.3 06/13/2021 2004   O2SAT 85.1 06/13/2021 2004      Coagulation Profile: Recent Labs  Lab 06/14/21 0348 06/14/21 0846  INR 1.3* 1.2     HbA1C: Hgb A1c MFr Bld  Date/Time Value Ref Range Status  05/28/2021 12:23 AM 8.2 (H) 4.8 - 5.6 % Final    Comment:    (NOTE) Pre diabetes:          5.7%-6.4%  Diabetes:              >6.4%  Glycemic control for   <7.0% adults with diabetes   12/27/2020 05:21 AM 7.4 (H) 4.8 - 5.6 % Final    Comment:    (NOTE) Pre diabetes:          5.7%-6.4%  Diabetes:              >6.4%  Glycemic control for   <7.0% adults with diabetes     CBG: Recent Labs  Lab 06/17/21 1132 06/17/21 1546 06/17/21 1924 06/17/21 2340 06/18/21 0354  GLUCAP 200* 244* 145* 153* 158*     Allergies Allergies  Allergen Reactions   Insulin Aspart Other (See Comments)    CELLULITIS   Spironolactone Nausea Only and Rash   Hydrochlorothiazide Itching and Rash   Morphine Other (See Comments)    "MAKES ME MEAN"        DVT/GI PRX  assessed I Assessed the need for Labs I  Assessed the need for Foley I Assessed the need for Central Venous Line Family Discussion when available I Assessed the need for Mobilization I made an Assessment of medications to be adjusted accordingly Safety Risk assessment completed  CASE DISCUSSED IN MULTIDISCIPLINARY ROUNDS WITH ICU TEAM     Critical Care Time devoted to patient care services described in this note is 45  minutes.  Critical care was necessary to treat /prevent imminent and life-threatening deterioration. Overall, patient is critically ill, prognosis is guarded.     Corrin Parker, M.D.  [facility]  Medical Director [place] Director [facility] Cardio-Pulmonary Department

## 2021-06-19 DIAGNOSIS — I480 Paroxysmal atrial fibrillation: Secondary | ICD-10-CM

## 2021-06-19 DIAGNOSIS — I9589 Other hypotension: Secondary | ICD-10-CM

## 2021-06-19 DIAGNOSIS — E1065 Type 1 diabetes mellitus with hyperglycemia: Secondary | ICD-10-CM

## 2021-06-19 DIAGNOSIS — I959 Hypotension, unspecified: Secondary | ICD-10-CM

## 2021-06-19 DIAGNOSIS — E861 Hypovolemia: Secondary | ICD-10-CM

## 2021-06-19 LAB — GLUCOSE, CAPILLARY
Glucose-Capillary: 147 mg/dL — ABNORMAL HIGH (ref 70–99)
Glucose-Capillary: 219 mg/dL — ABNORMAL HIGH (ref 70–99)
Glucose-Capillary: 294 mg/dL — ABNORMAL HIGH (ref 70–99)
Glucose-Capillary: 277 mg/dL — ABNORMAL HIGH (ref 70–99)
Glucose-Capillary: 244 mg/dL — ABNORMAL HIGH (ref 70–99)
Glucose-Capillary: 196 mg/dL — ABNORMAL HIGH (ref 70–99)

## 2021-06-19 LAB — CBC
HCT: 27.9 % — ABNORMAL LOW (ref 39.0–52.0)
Hemoglobin: 9.7 g/dL — ABNORMAL LOW (ref 13.0–17.0)
MCH: 35 pg — ABNORMAL HIGH (ref 26.0–34.0)
MCHC: 34.8 g/dL (ref 30.0–36.0)
MCV: 100.7 fL — ABNORMAL HIGH (ref 80.0–100.0)
Platelets: 249 10*3/uL (ref 150–400)
RBC: 2.77 MIL/uL — ABNORMAL LOW (ref 4.22–5.81)
RDW: 16.2 % — ABNORMAL HIGH (ref 11.5–15.5)
WBC: 18.2 10*3/uL — ABNORMAL HIGH (ref 4.0–10.5)
nRBC: 0.2 % (ref 0.0–0.2)

## 2021-06-19 LAB — BASIC METABOLIC PANEL
Anion gap: 13 (ref 5–15)
BUN: 12 mg/dL (ref 6–20)
CO2: 20 mmol/L — ABNORMAL LOW (ref 22–32)
Calcium: 7.2 mg/dL — ABNORMAL LOW (ref 8.9–10.3)
Chloride: 104 mmol/L (ref 98–111)
Creatinine, Ser: 0.88 mg/dL (ref 0.61–1.24)
GFR, Estimated: >60 mL/min (ref >60)
Glucose, Bld: 261 mg/dL — ABNORMAL HIGH (ref 70–99)
Potassium: 3.9 mmol/L (ref 3.5–5.1)
Sodium: 137 mmol/L (ref 135–145)

## 2021-06-19 LAB — PHOSPHORUS: Phosphorus: 2.2 mg/dL — ABNORMAL LOW (ref 2.5–4.6)

## 2021-06-19 LAB — MAGNESIUM: Magnesium: 1.8 mg/dL (ref 1.7–2.4)

## 2021-06-19 MED ORDER — DIGOXIN 0.25 MG/ML IJ SOLN
0.2500 mg | Freq: Once | INTRAMUSCULAR | Status: AC
  Administered 2021-06-19: 0.25 mg via INTRAVENOUS
  Filled 2021-06-19: qty 2

## 2021-06-19 MED ORDER — INSULIN GLARGINE-YFGN 100 UNIT/ML ~~LOC~~ SOLN
10.0000 [IU] | Freq: Every day | SUBCUTANEOUS | Status: DC
Start: 2021-06-19 — End: 2021-06-21
  Administered 2021-06-19 – 2021-06-21 (×3): 10 [IU] via SUBCUTANEOUS
  Filled 2021-06-19 (×3): qty 0.1

## 2021-06-19 MED ORDER — AMIODARONE LOAD VIA INFUSION
150.0000 mg | Freq: Once | INTRAVENOUS | Status: AC
  Administered 2021-06-19: 150 mg via INTRAVENOUS
  Filled 2021-06-19: qty 83.34

## 2021-06-19 MED ORDER — POTASSIUM & SODIUM PHOSPHATES 280-160-250 MG PO PACK
1.0000 | PACK | Freq: Three times a day (TID) | ORAL | Status: DC
  Administered 2021-06-19 – 2021-06-21 (×8): 1 via ORAL
  Filled 2021-06-19 (×11): qty 1

## 2021-06-19 MED ORDER — AMIODARONE HCL IN DEXTROSE 360-4.14 MG/200ML-% IV SOLN
30.0000 mg/h | INTRAVENOUS | Status: DC
Start: 2021-06-19 — End: 2021-06-20
  Administered 2021-06-20: 30 mg/h via INTRAVENOUS
  Filled 2021-06-19: qty 200

## 2021-06-19 MED ORDER — AMIODARONE HCL IN DEXTROSE 360-4.14 MG/200ML-% IV SOLN
60.0000 mg/h | INTRAVENOUS | Status: AC
  Administered 2021-06-19 (×2): 60 mg/h via INTRAVENOUS
  Filled 2021-06-19: qty 400

## 2021-06-19 MED ORDER — DILTIAZEM HCL 30 MG PO TABS
60.0000 mg | ORAL_TABLET | Freq: Three times a day (TID) | ORAL | Status: DC
  Administered 2021-06-19 – 2021-06-21 (×6): 60 mg via ORAL
  Filled 2021-06-19 (×6): qty 2

## 2021-06-19 MED ORDER — LACTATED RINGERS IV BOLUS
1000.0000 mL | Freq: Once | INTRAVENOUS | Status: AC
  Administered 2021-06-19: 1000 mL via INTRAVENOUS

## 2021-06-19 NOTE — Consult Note (Signed)
[place] Nurse ostomy follow up Pt had colostomy surgery performed 8/7. He has been going though alcohol withdrawal and is in ICU and is very groggy and did not participate in the teaching session or ask questions.  Stoma type/location: Stoma is 90% red and viable, flush with skin level.  Stomal assessment/size: 1 3/4 inches and oval, 10% necrotic edge to stoma from 4:00 o'clock to 6:00 o'clock Peristomal assessment: intact skin surrounding Output: small amt brown liquid stool Ostomy pouching: 2pc. (Barrier ring, # [id number], wafer # 2, pouch # [id number]) Education provided:  Applied barrier ring to attempt to maintain a seal and 2 piece pouching system.  Described steps and demonstrated the procedure to the patient but he did not appear interested or understand. Extra supplies and teaching materials left at the bedside.  Enrolled patient in [place] Discharge program: Yes, previously. [place] team will continue educational sessions when pt is stable and out of ICU.  Julien Girt MSN, RN, [place], [place], [place]

## 2021-06-19 NOTE — TOC Progression Note (Signed)
Transition of Care [facility]) - Progression Note    Patient Details  Name: Russell Gomez MRN: [id number] Date of Birth: 1961-09-14  Transition of Care [facility]) CM/SW [place], [place] Phone Number: [phone] 06/19/2021, 5:29 PM  Clinical Narrative:     Patient presents to [facility] due to complaints of abdominal pain.  Patient has hx of alcohol and tobacco use.  Patient stated he drinks 7 drinks per day but has not drunk alcohol for three days prior to ED visit. Patient was sent to OR for sigmoid colon resection with colostomy (Hartmann's procedure) and appendectomy, 06/14/2021. [place], [place] (Son) [phone] [facility]) main contact, Ti Pelkey spouse [phone], [phone] (pt and spouse are legally separated). Patient is independent with ADLs.   Expected Discharge Plan: [place] Barriers to Discharge: Continued Medical Work up  Expected Discharge Plan and Services Expected Discharge Plan: [place] In-house Referral: Clinical Social Work   Post Acute Care Choice: [place] arrangements for the past 2 months: Single Family Home                                       Social Determinants of Health (SDOH) Interventions    Readmission Risk Interventions No flowsheet data found.

## 2021-06-19 NOTE — Progress Notes (Signed)
PROGRESS NOTE    Russell Gomez  [id number] DOB: 06-21-1961 DOA: 06/14/2021 PCP: Ria Bush, MD   Chief complaint.  Abdominal pain. Brief Narrative:   60 yo male who presented to [facility] ER on 08/7 with a 3 day hx of lower abdominal pain.  He does have a hx of chronic ETOH abuse, however reported his last drink was 08/4.  CT Abd Pelvis revealed diverticular disease with perforation.  Therefore, General Surgery consulted and pt underwent emergent sigmoid colon resection with colostomy (Hartmann's procedure) and appendectomy.  He was subsequently admitted to the stepdown unit postop for additional workup and treatment.  However, on 08/9 hospital course complicated by severe ETOH withdrawal symptoms despite administration of ativan per CIWA protocol.  Therefore, precedex gtt ordered and PCCM team consulted to assist with plan of care. Patient was transferred to medical floor on 8/11.  Assessment & Plan:   Principal Problem:   Bowel perforation (HCC) Active Problems:   Essential hypertension   Hypocalcemia   Hypomagnesemia   Alcohol use disorder, moderate, dependence (HCC)   Type 1 diabetes mellitus with hyperglycemia (HCC)   AF (paroxysmal atrial fibrillation) ([place])   Terminal ileitis ([place])  #1.  Paroxysmal atrial fibrillation with rapid major response. Hypotension secondary to tachycardia. Patient had a recent echocardiogram performed 12/2020, ejection fraction 60 to 65% with mild to moderate mitral regurgitation. Consult to cardiology is obtained. Will give amnio drip, 1 dose of digoxin.  Also give a liter of fluid for hypotension.  #2.  Alcohol withdrawal with delirium tremens. Aspiration pneumonia secondary to alcohol withdrawal. Patient was on Precedex gtt. condition had improved. Patient still on Zosyn for suppression pneumonia   #3.  Diverticulosis with perforation. Status post a Hartmann procedure.  Patient is still on antibiotics with Zosyn. Patient has passed gas,  no bowel movement. Currently on liquid diet, followed by general surgery.  4.  Type 1 diabetes. Patient currently is not in DKA, patient started full liquid diet already, I will start Lantus 10 units daily.  5.  Hypokalemia Hypophosphatemia Hypomagnesemia Potassium is better, will give oral phosphorus supplement.       DVT prophylaxis: Lovenox Code Status: full Family Communication:  Disposition Plan:    Status is: Inpatient  Remains inpatient appropriate because:Hemodynamically unstable, IV treatments appropriate due to intensity of illness or inability to take PO, and Inpatient level of care appropriate due to severity of illness  Dispo: The patient is from: Home              Anticipated d/c is to: Home              Patient currently is not medically stable to d/c.   Difficult to place patient No        I/O last 3 completed shifts: In: 4220.2 [P.O.:420; I.V.:2898.8; IV Piggyback:901.4] Out: [id number] [Urine:1020; Drains:570; Stool:200] No intake/output data recorded.     Consultants:  General surgery  Procedures: abdominal surgery  Antimicrobials: Zosyn  Subjective: I was called in to the ICU by the nurse, patient developed atrial fibrillation with rapid ventricle response, blood pressure has been low.  Discussed with the nurse, patient has been running in and out of A. fib since last night.  He does not have any chest pain.  His urine output has been low, he is currently on liquid diet.  He is also receiving IV fluids. Denies any short of breath or cough, however on 2 L oxygen. No fever chills pain No dysuria hematuria. No  abdominal pain or nausea vomiting.  He has passed gas, no bowel movement.  Objective: Vitals:   06/19/21 0500 06/19/21 0545 06/19/21 0600 06/19/21 0700  BP: (!) 99/50 (!) 100/50 (!) 94/54 (!) 90/48  Pulse: (!) 115 (!) 117 (!) 123 (!) 123  Resp:   20   Temp:   99.1 F (37.3 C)   TempSrc:   Oral   SpO2: 96% 95% 95% 95%  Weight: 112.6 kg      Height:        Intake/Output Summary (Last 24 hours) at 06/19/2021 0742 Last data filed at 06/19/2021 0600 Gross per 24 hour  Intake 2931.35 ml  Output 1200 ml  Net 1731.35 ml   Filed Weights   06/13/21 1932 06/19/21 0500  Weight: 97.1 kg 112.6 kg    Examination:  General exam: Appears calm and comfortable  Respiratory system: Clear to auscultation. Respiratory effort normal. Cardiovascular system: Irregularly irregular, tachycardic. No JVD, murmurs, rubs, gallops or clicks. No pedal edema. Gastrointestinal system: Abdomen is nondistended, soft and nontender. No organomegaly or masses felt. Normal bowel sounds heard. Central nervous system: Alert and oriented x3. No focal neurological deficits. Extremities: Symmetric 5 x 5 power. Skin: No rashes, lesions or ulcers Psychiatry: Mood & affect appropriate.     Data Reviewed: I have personally reviewed following labs and imaging studies  CBC: Recent Labs  Lab 06/14/21 0600 06/14/21 0846 06/16/21 0431 06/17/21 0447 06/18/21 0447  WBC 11.5* 12.6* 11.7* 7.7 9.4  NEUTROABS  --   --   --   --  5.3  HGB 11.1* 11.9* 11.6* 9.6* 10.6*  HCT 31.7* 35.0* 33.8* 28.7* 30.8*  MCV 99.4 100.3* 98.5 99.7 99.4  PLT 251 292 281 173 0000000   Basic Metabolic Panel: Recent Labs  Lab 06/14/21 1720 06/15/21 0456 06/16/21 0431 06/16/21 1941 06/17/21 0447 06/17/21 1900 06/18/21 0447 06/19/21 0530  NA 138 139 137  --  138  --  141  --   K 3.3* 3.1* 3.4* 3.3* 3.6  --  3.7  --   CL 102 105 103  --  104  --  110  --   CO2 '31 30 27  '$ --  30  --  28  --   GLUCOSE 142* 101* 69*  --  206*  --  162*  --   BUN '15 18 17  '$ --  12  --  9  --   CREATININE 1.06 0.97 0.75  --  0.62  --  0.60*  --   CALCIUM 7.1* 6.5* 7.2*  --  7.3*  --  7.1*  --   MG 1.8 1.6* 1.9  --  1.5* 1.7 1.5* 1.8  PHOS 3.8  --  2.1* 3.7 3.5  --  2.6 2.2*   GFR: Estimated Creatinine Clearance: 134.4 mL/min (A) (by C-G formula based on SCr of 0.6 mg/dL (L)). Liver Function  Tests: Recent Labs  Lab 06/13/21 2005 06/14/21 0846 06/16/21 0431 06/17/21 0447  AST 58* 54* 43* 58*  ALT 32 '23 19 22  '$ ALKPHOS 200* 153* 178* 223*  BILITOT 2.0* 1.9* 1.3* 0.9  PROT 5.8* 4.8* 5.1* 4.4*  ALBUMIN 2.4* 1.8* 1.6* 1.4*   Recent Labs  Lab 06/13/21 2005  LIPASE 18   No results for input(s): AMMONIA in the last 168 hours. Coagulation Profile: Recent Labs  Lab 06/14/21 0348 06/14/21 0846  INR 1.3* 1.2   Cardiac Enzymes: No results for input(s): CKTOTAL, CKMB, CKMBINDEX, TROPONINI in the last 168 hours. BNP (  last 3 results) No results for input(s): PROBNP in the last 8760 hours. HbA1C: No results for input(s): HGBA1C in the last 72 hours. CBG: Recent Labs  Lab 06/18/21 1532 06/18/21 1913 06/18/21 2310 06/19/21 0409 06/19/21 0729  GLUCAP 185* 250* 199* 147* 219*   Lipid Profile: No results for input(s): CHOL, HDL, LDLCALC, TRIG, CHOLHDL, LDLDIRECT in the last 72 hours. Thyroid Function Tests: No results for input(s): TSH, T4TOTAL, FREET4, T3FREE, THYROIDAB in the last 72 hours. Anemia Panel: No results for input(s): VITAMINB12, FOLATE, FERRITIN, TIBC, IRON, RETICCTPCT in the last 72 hours. Sepsis Labs: Recent Labs  Lab 06/14/21 0348 06/15/21 0800  LATICACIDVEN 4.5* 1.0    Recent Results (from the past 240 hour(s))  Resp Panel by RT-PCR (Flu A&B, Covid) Nasopharyngeal Swab     Status: None   Collection Time: 06/14/21  3:48 AM   Specimen: Nasopharyngeal Swab; Nasopharyngeal(NP) swabs in vial transport medium  Result Value Ref Range Status   SARS Coronavirus 2 by RT PCR NEGATIVE NEGATIVE Final    Comment: (NOTE) SARS-CoV-2 target nucleic acids are NOT DETECTED.  The SARS-CoV-2 RNA is generally detectable in upper respiratory specimens during the acute phase of infection. The lowest concentration of SARS-CoV-2 viral copies this assay can detect is 138 copies/mL. A negative result does not preclude SARS-Cov-2 infection and should not be used as  the sole basis for treatment or other patient management decisions. A negative result may occur with  improper specimen collection/handling, submission of specimen other than nasopharyngeal swab, presence of viral mutation(s) within the areas targeted by this assay, and inadequate number of viral copies(<138 copies/mL). A negative result must be combined with clinical observations, patient history, and epidemiological information. The expected result is Negative.  Fact Sheet for Patients:  EntrepreneurPulse.com.au  Fact Sheet for Healthcare Providers:  IncredibleEmployment.be  This test is no t yet approved or cleared by the [place] FDA and  has been authorized for detection and/or diagnosis of SARS-CoV-2 by FDA under an Emergency Use Authorization (EUA). This EUA will remain  in effect (meaning this test can be used) for the duration of the COVID-19 declaration under Section 564(b)(1) of the Act, 21 U.S.C.section 360bbb-3(b)(1), unless the authorization is terminated  or revoked sooner.       Influenza A by PCR NEGATIVE NEGATIVE Final   Influenza B by PCR NEGATIVE NEGATIVE Final    Comment: (NOTE) The Xpert Xpress SARS-CoV-2/FLU/RSV plus assay is intended as an aid in the diagnosis of influenza from Nasopharyngeal swab specimens and should not be used as a sole basis for treatment. Nasal washings and aspirates are unacceptable for Xpert Xpress SARS-CoV-2/FLU/RSV testing.  Fact Sheet for Patients: EntrepreneurPulse.com.au  Fact Sheet for Healthcare Providers: IncredibleEmployment.be  This test is not yet approved or cleared by the [place] FDA and has been authorized for detection and/or diagnosis of SARS-CoV-2 by FDA under an Emergency Use Authorization (EUA). This EUA will remain in effect (meaning this test can be used) for the duration of the COVID-19 declaration under Section 564(b)(1) of  the Act, 21 U.S.C. section 360bbb-3(b)(1), unless the authorization is terminated or revoked.  Performed at [facility], [address] [place], [place] [address]   MRSA Next Gen by PCR, Nasal     Status: None   Collection Time: 06/14/21 10:10 AM   Specimen: Nasal Mucosa; Nasal Swab  Result Value Ref Range Status   MRSA by PCR Next Gen NOT DETECTED NOT DETECTED Final    Comment: (NOTE) The  GeneXpert MRSA Assay (FDA approved for NASAL specimens only), is one component of a comprehensive MRSA colonization surveillance program. It is not intended to diagnose MRSA infection nor to guide or monitor treatment for MRSA infections. Test performance is not FDA approved in patients less than 51 years old. Performed at [facility], [address] [place], [place] [address]   CULTURE, BLOOD (ROUTINE X 2) w Reflex to ID Panel     Status: None (Preliminary result)   Collection Time: 06/15/21 11:46 AM   Specimen: BLOOD  Result Value Ref Range Status   Specimen Description BLOOD BLOOD LEFT ARM  Final   Special Requests   Final    BOTTLES DRAWN AEROBIC AND ANAEROBIC Blood Culture adequate volume   Culture   Final    NO GROWTH 4 DAYS Performed at [facility], [address], [place], [place] [address]    Report Status PENDING  Incomplete  CULTURE, BLOOD (ROUTINE X 2) w Reflex to ID Panel     Status: None (Preliminary result)   Collection Time: 06/15/21 11:46 AM   Specimen: BLOOD  Result Value Ref Range Status   Specimen Description BLOOD BLOOD RIGHT ARM  Final   Special Requests   Final    BOTTLES DRAWN AEROBIC AND ANAEROBIC Blood Culture results may not be optimal due to an inadequate volume of blood received in culture bottles   Culture   Final    NO GROWTH 4 DAYS Performed at [facility], [address], [place], [place] [address]    Report Status PENDING  Incomplete         Radiology Studies: No results found.      Scheduled  Meds:  amiodarone  150 mg Intravenous Once   Chlorhexidine Gluconate Cloth  6 each Topical Daily   diazepam  2.5 mg Oral Q6H   digoxin  0.25 mg Intravenous Once   enoxaparin (LOVENOX) injection  1 mg/kg Subcutaneous Q12H   feeding supplement (NEPRO CARB STEADY)  237 mL Oral TID BM   folic acid  1 mg Oral Daily   insulin aspart  0-6 Units Subcutaneous Q4H   midodrine  10 mg Oral TID WC   multivitamin with minerals  1 tablet Oral Daily   nicotine  21 mg Transdermal Daily   pantoprazole (PROTONIX) IV  40 mg Intravenous QHS   polyethylene glycol  17 g Oral Daily   sodium chloride flush  10-40 mL Intracatheter Q12H   Continuous Infusions:  amiodarone     Followed by   amiodarone     dexmedetomidine (PRECEDEX) IV infusion Stopped (06/18/21 1159)   lactated ringers     lactated ringers 75 mL/hr at 06/18/21 1534   piperacillin-tazobactam (ZOSYN)  IV 3.375 g (06/19/21 0212)   thiamine injection Stopped (06/18/21 1044)     LOS: 5 days    Time spent: 40 minutes    Sharen Hones, MD [facility]   To contact the attending provider between 7A-7P or the covering provider during after hours 7P-7A, please log into the web site [email or web address] and access using universal [place] password for that web site. If you do not have the password, please call the hospital operator.  06/19/2021, 7:42 AM

## 2021-06-19 NOTE — Progress Notes (Signed)
Patient had emesis post Midodrine and potassium & sodium phosphate administration.

## 2021-06-19 NOTE — Progress Notes (Signed)
Patient ID: Russell Gomez, male   DOB: [birth year], 60 y.o.   MRN: [id number]     [place] Hospital Day(s): 5.   Interval History: Patient seen and examined, no acute events or new complaints overnight. Patient reports having intermittent nausea.  Denies any pain  Vital signs in last 24 hours: [min-max] current  Temp:  [97.7 F (36.5 C)-100.4 F (38 C)] 98.2 F (36.8 C) (08/12 1300) Pulse Rate:  [82-145] 108 (08/12 1830) Resp:  [16-29] 23 (08/12 1830) BP: (90-140)/(48-83) 140/63 (08/12 1830) SpO2:  [92 %-100 %] 96 % (08/12 1830) Weight:  [112.6 kg] 112.6 kg (08/12 0500)     Height: '6\' 2"'$  (188 cm) Weight: 112.6 kg BMI (Calculated): 31.86   Physical Exam:  Constitutional: alert, cooperative and no distress  Respiratory: breathing non-labored at rest  Cardiovascular: Irregular rate and rhythm Gastrointestinal: soft, non-tender, and mild-distended  Labs:  CBC Latest Ref Rng & Units 06/19/2021 06/18/2021 06/17/2021  WBC 4.0 - 10.5 K/uL 18.2(H) 9.4 7.7  Hemoglobin 13.0 - 17.0 g/dL 9.7(L) 10.6(L) 9.6(L)  Hematocrit 39.0 - 52.0 % 27.9(L) 30.8(L) 28.7(L)  Platelets 150 - 400 K/uL 249 175 173   CMP Latest Ref Rng & Units 06/19/2021 06/18/2021 06/17/2021  Glucose 70 - 99 mg/dL 261(H) 162(H) 206(H)  BUN 6 - 20 mg/dL '12 9 12  '$ Creatinine 0.61 - 1.24 mg/dL 0.88 0.60(L) 0.62  Sodium 135 - 145 mmol/L 137 141 138  Potassium 3.5 - 5.1 mmol/L 3.9 3.7 3.6  Chloride 98 - 111 mmol/L 104 110 104  CO2 22 - 32 mmol/L 20(L) 28 30  Calcium 8.9 - 10.3 mg/dL 7.2(L) 7.1(L) 7.3(L)  Total Protein 6.5 - 8.1 g/dL - - 4.4(L)  Total Bilirubin 0.3 - 1.2 mg/dL - - 0.9  Alkaline Phos 38 - 126 U/L - - 223(H)  AST 15 - 41 U/L - - 58(H)  ALT 0 - 44 U/L - - 22    Imaging studies: No new pertinent imaging studies   Assessment/Plan:  60 y.o. male with diverticulitis with perforation 5 Day Post-Op s/p Hartman's procedure, complicated by pertinent comorbidities including type 1 diabetes, A. fib and on  anticoagulation, chronic alcohol abuse.  Patient with intermittent ileus.  He is also meeting is having adequate output but he developed nausea and vomiting when trying to drink or getting his oral medications.  There was an increasing white blood cell count today.  If there is an improvement for tomorrow might consider scanning the abdomen for possible intra-abdominal abscess which are expected after perforated diverticulitis.  Appreciate hospitalist, cardiology for the management of further medical comorbidities.  Keep n.p.o. for tonight.  Arnold Long, MD
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
## 2021-06-19 NOTE — TOC Progression Note (Signed)
Transition of Care [facility]) - Progression Note    Patient Details  Name: Russell Gomez MRN: [id number] Date of Birth: 01-22-1961  Transition of Care [facility]) CM/SW [place], [place] Phone Number: [phone] 06/19/2021, 5:40 PM  Clinical Narrative:     Patient remains medically unstable for discharge.  Patient experience emesis episode today.  Patient is difficult to communicate with and does become easily aggravated.  TOC continues to follow.  Expected Discharge Plan: [place] Barriers to Discharge: Continued Medical Work up  Expected Discharge Plan and Services Expected Discharge Plan: [place] In-house Referral: Clinical Social Work   Post Acute Care Choice: [place] arrangements for the past 2 months: Single Family Home                                       Social Determinants of Health (SDOH) Interventions    Readmission Risk Interventions No flowsheet data found.

## 2021-06-20 ENCOUNTER — Encounter: Payer: Self-pay | Admitting: General Surgery

## 2021-06-20 LAB — CULTURE, BLOOD (ROUTINE X 2)
Culture: NO GROWTH
Culture: NO GROWTH
Special Requests: ADEQUATE

## 2021-06-20 LAB — GLUCOSE, CAPILLARY
Glucose-Capillary: 250 mg/dL — ABNORMAL HIGH (ref 70–99)
Glucose-Capillary: 210 mg/dL — ABNORMAL HIGH (ref 70–99)
Glucose-Capillary: 163 mg/dL — ABNORMAL HIGH (ref 70–99)
Glucose-Capillary: 102 mg/dL — ABNORMAL HIGH (ref 70–99)
Glucose-Capillary: 113 mg/dL — ABNORMAL HIGH (ref 70–99)

## 2021-06-20 LAB — PHOSPHORUS: Phosphorus: 2.5 mg/dL (ref 2.5–4.6)

## 2021-06-20 LAB — MAGNESIUM: Magnesium: 1.3 mg/dL — ABNORMAL LOW (ref 1.7–2.4)

## 2021-06-20 MED ORDER — MAGNESIUM SULFATE 4 GM/100ML IV SOLN
4.0000 g | Freq: Once | INTRAVENOUS | Status: AC
  Administered 2021-06-20: 4 g via INTRAVENOUS
  Filled 2021-06-20: qty 100

## 2021-06-20 MED ORDER — APIXABAN 5 MG PO TABS
5.0000 mg | ORAL_TABLET | Freq: Two times a day (BID) | ORAL | Status: DC
  Administered 2021-06-20 – 2021-06-25 (×10): 5 mg via ORAL
  Filled 2021-06-20 (×10): qty 1

## 2021-06-20 MED ORDER — AMIODARONE HCL 200 MG PO TABS
400.0000 mg | ORAL_TABLET | Freq: Two times a day (BID) | ORAL | Status: DC
  Administered 2021-06-20 – 2021-06-24 (×9): 400 mg via ORAL
  Filled 2021-06-20 (×9): qty 2

## 2021-06-20 NOTE — Progress Notes (Signed)
PROGRESS NOTE    Russell Gomez  [id number] DOB: Dec 25, 1960 DOA: 06/14/2021 PCP: Ria Bush, MD  Chief complaint.  Abdominal pain.  Brief Narrative:   60 yo male who presented to [facility] ER on 08/7 with a 3 day hx of lower abdominal pain.  He does have a hx of chronic ETOH abuse, however reported his last drink was 08/4.  CT Abd Pelvis revealed diverticular disease with perforation.  Therefore, General Surgery consulted and pt underwent emergent sigmoid colon resection with colostomy (Hartmann's procedure) and appendectomy.  He was subsequently admitted to the stepdown unit postop for additional workup and treatment.  However, on 08/9 hospital course complicated by severe ETOH withdrawal symptoms despite administration of ativan per CIWA protocol.  Therefore, precedex gtt ordered and PCCM team consulted to assist with plan of care. Patient was transferred to medical floor on 8/11.   Assessment & Plan:   Principal Problem:   Bowel perforation (HCC) Active Problems:   Essential hypertension   Hypocalcemia   Hypomagnesemia   Alcohol use disorder, moderate, dependence (HCC)   Type 1 diabetes mellitus with hyperglycemia (HCC)   AF (paroxysmal atrial fibrillation) (HCC)   Terminal ileitis (HCC)   Hypotension  #1.  Paroxysmal atrial fibrillation with rapid major response. Hypotension secondary to tachycardia. Patient heart rate is better controlled today, will continue oral diltiazem.  Change amnio drip to 40 mg oral twice a day.  #2.  Alcohol withdrawal with delirium tremens. Aspiration pneumonia secondary to alcohol withdrawal. Continue Zosyn.  #3.  Diverticulosis with perforation. Status post a Hartmann procedure.  Patient is still on antibiotics with Zosyn. Patient is a followed by surgery, patient had nausea and vomiting yesterday, was placed on n.p.o. again.  He feels better today, he has some stool in ostomy bag.  Clear liquid diet was started again.  I will discontinue  IV fluids.  4.  Type 1 diabetes. Continue Lantus.  5.  Hypokalemia Hypophosphatemia Hypomagnesemia Continue to monitor.  Gave 4 gram magnesium sulfate   DVT prophylaxis: Lovenox Code Status: full Family Communication:  Disposition Plan:    Status is: Inpatient  Remains inpatient appropriate because:IV treatments appropriate due to intensity of illness or inability to take PO and Inpatient level of care appropriate due to severity of illness  Dispo: The patient is from: Home              Anticipated d/c is to: Home              Patient currently is not medically stable to d/c.   Difficult to place patient No        I/O last 3 completed shifts: In: 4574.8 [P.O.:580; I.V.:3749.4; IV Piggyback:245.4] Out: 1626 [Urine:390; Emesis/NG output:1; Drains:660; Stool:575] Total I/O In: 1176.1 [P.O.:200; I.V.:845.5; IV Piggyback:130.7] Out: 400 [Urine:200; Stool:200]     Consultants:  General surgey  Procedures:   Antimicrobials: Zosyn  Subjective: Patient doing well today, no longer has any nausea vomiting.  He has loose stools in his ostomy bag.  No abdominal pain. Denies any short of breath or cough. No chest pain palpitation. No dysuria hematuria  No fever or chills.   Objective: Vitals:   06/20/21 0800 06/20/21 0900 06/20/21 1000 06/20/21 1100  BP: 120/71 (!) 126/53 118/61 (!) 129/56  Pulse: 93 95 80 78  Resp: (!) '22 16 17 16  '$ Temp: 98.7 F (37.1 C)     TempSrc: Oral     SpO2: 98% 97% 97% 99%  Weight:  Height:        Intake/Output Summary (Last 24 hours) at 06/20/2021 1149 Last data filed at 06/20/2021 1100 Gross per 24 hour  Intake 2720.45 ml  Output 1195 ml  Net 1525.45 ml   Filed Weights   06/13/21 1932 06/19/21 0500 06/20/21 0500  Weight: 97.1 kg 112.6 kg 115.2 kg    Examination:  General exam: Appears calm and comfortable  Respiratory system: Clear to auscultation. Respiratory effort normal. Cardiovascular system: Irregular.  No JVD,  murmurs, rubs, gallops or clicks. No pedal edema. Gastrointestinal system: Abdomen is nondistended, soft and nontender. No organomegaly or masses felt. Normal bowel sounds heard. Central nervous system: Alert and oriented. No focal neurological deficits. Extremities: Symmetric 5 x 5 power. Skin: No rashes, lesions or ulcers Psychiatry: Judgement and insight appear normal. Mood & affect appropriate.     Data Reviewed: I have personally reviewed following labs and imaging studies  CBC: Recent Labs  Lab 06/14/21 0846 06/16/21 0431 06/17/21 0447 06/18/21 0447 06/19/21 0853  WBC 12.6* 11.7* 7.7 9.4 18.2*  NEUTROABS  --   --   --  5.3  --   HGB 11.9* 11.6* 9.6* 10.6* 9.7*  HCT 35.0* 33.8* 28.7* 30.8* 27.9*  MCV 100.3* 98.5 99.7 99.4 100.7*  PLT 292 281 173 175 0000000   Basic Metabolic Panel: Recent Labs  Lab 06/15/21 0456 06/16/21 0431 06/16/21 1941 06/17/21 0447 06/17/21 1900 06/18/21 0447 06/19/21 0530 06/19/21 0853 06/20/21 0529  NA 139 137  --  138  --  141  --  137  --   K 3.1* 3.4* 3.3* 3.6  --  3.7  --  3.9  --   CL 105 103  --  104  --  110  --  104  --   CO2 30 27  --  30  --  28  --  20*  --   GLUCOSE 101* 69*  --  206*  --  162*  --  261*  --   BUN 18 17  --  12  --  9  --  12  --   CREATININE 0.97 0.75  --  0.62  --  0.60*  --  0.88  --   CALCIUM 6.5* 7.2*  --  7.3*  --  7.1*  --  7.2*  --   MG 1.6* 1.9  --  1.5* 1.7 1.5* 1.8  --  1.3*  PHOS  --  2.1* 3.7 3.5  --  2.6 2.2*  --  2.5   GFR: Estimated Creatinine Clearance: 123.5 mL/min (by C-G formula based on SCr of 0.88 mg/dL). Liver Function Tests: Recent Labs  Lab 06/13/21 2005 06/14/21 0846 06/16/21 0431 06/17/21 0447  AST 58* 54* 43* 58*  ALT 32 '23 19 22  '$ ALKPHOS 200* 153* 178* 223*  BILITOT 2.0* 1.9* 1.3* 0.9  PROT 5.8* 4.8* 5.1* 4.4*  ALBUMIN 2.4* 1.8* 1.6* 1.4*   Recent Labs  Lab 06/13/21 2005  LIPASE 18   No results for input(s): AMMONIA in the last 168 hours. Coagulation  Profile: Recent Labs  Lab 06/14/21 0348 06/14/21 0846  INR 1.3* 1.2   Cardiac Enzymes: No results for input(s): CKTOTAL, CKMB, CKMBINDEX, TROPONINI in the last 168 hours. BNP (last 3 results) No results for input(s): PROBNP in the last 8760 hours. HbA1C: No results for input(s): HGBA1C in the last 72 hours. CBG: Recent Labs  Lab 06/19/21 1933 06/19/21 2218 06/20/21 0533 06/20/21 0731 06/20/21 1129  GLUCAP 244* 196* 250*  210* 163*   Lipid Profile: No results for input(s): CHOL, HDL, LDLCALC, TRIG, CHOLHDL, LDLDIRECT in the last 72 hours. Thyroid Function Tests: No results for input(s): TSH, T4TOTAL, FREET4, T3FREE, THYROIDAB in the last 72 hours. Anemia Panel: No results for input(s): VITAMINB12, FOLATE, FERRITIN, TIBC, IRON, RETICCTPCT in the last 72 hours. Sepsis Labs: Recent Labs  Lab 06/14/21 0348 06/15/21 0800  LATICACIDVEN 4.5* 1.0    Recent Results (from the past 240 hour(s))  Resp Panel by RT-PCR (Flu A&B, Covid) Nasopharyngeal Swab     Status: None   Collection Time: 06/14/21  3:48 AM   Specimen: Nasopharyngeal Swab; Nasopharyngeal(NP) swabs in vial transport medium  Result Value Ref Range Status   SARS Coronavirus 2 by RT PCR NEGATIVE NEGATIVE Final    Comment: (NOTE) SARS-CoV-2 target nucleic acids are NOT DETECTED.  The SARS-CoV-2 RNA is generally detectable in upper respiratory specimens during the acute phase of infection. The lowest concentration of SARS-CoV-2 viral copies this assay can detect is 138 copies/mL. A negative result does not preclude SARS-Cov-2 infection and should not be used as the sole basis for treatment or other patient management decisions. A negative result may occur with  improper specimen collection/handling, submission of specimen other than nasopharyngeal swab, presence of viral mutation(s) within the areas targeted by this assay, and inadequate number of viral copies(<138 copies/mL). A negative result must be combined  with clinical observations, patient history, and epidemiological information. The expected result is Negative.  Fact Sheet for Patients:  EntrepreneurPulse.com.au  Fact Sheet for Healthcare Providers:  IncredibleEmployment.be  This test is no t yet approved or cleared by the [place] FDA and  has been authorized for detection and/or diagnosis of SARS-CoV-2 by FDA under an Emergency Use Authorization (EUA). This EUA will remain  in effect (meaning this test can be used) for the duration of the COVID-19 declaration under Section 564(b)(1) of the Act, 21 U.S.C.section 360bbb-3(b)(1), unless the authorization is terminated  or revoked sooner.       Influenza A by PCR NEGATIVE NEGATIVE Final   Influenza B by PCR NEGATIVE NEGATIVE Final    Comment: (NOTE) The Xpert Xpress SARS-CoV-2/FLU/RSV plus assay is intended as an aid in the diagnosis of influenza from Nasopharyngeal swab specimens and should not be used as a sole basis for treatment. Nasal washings and aspirates are unacceptable for Xpert Xpress SARS-CoV-2/FLU/RSV testing.  Fact Sheet for Patients: EntrepreneurPulse.com.au  Fact Sheet for Healthcare Providers: IncredibleEmployment.be  This test is not yet approved or cleared by the [place] FDA and has been authorized for detection and/or diagnosis of SARS-CoV-2 by FDA under an Emergency Use Authorization (EUA). This EUA will remain in effect (meaning this test can be used) for the duration of the COVID-19 declaration under Section 564(b)(1) of the Act, 21 U.S.C. section 360bbb-3(b)(1), unless the authorization is terminated or revoked.  Performed at [facility], [address] [place], [place] [address]   MRSA Next Gen by PCR, Nasal     Status: None   Collection Time: 06/14/21 10:10 AM   Specimen: Nasal Mucosa; Nasal Swab  Result Value Ref Range Status   MRSA by PCR Next Gen  NOT DETECTED NOT DETECTED Final    Comment: (NOTE) The GeneXpert MRSA Assay (FDA approved for NASAL specimens only), is one component of a comprehensive MRSA colonization surveillance program. It is not intended to diagnose MRSA infection nor to guide or monitor treatment for MRSA infections. Test performance is not FDA approved in patients  less than 58 years old. Performed at [facility], [address] [place], [place] [address]   CULTURE, BLOOD (ROUTINE X 2) w Reflex to ID Panel     Status: None   Collection Time: 06/15/21 11:46 AM   Specimen: BLOOD  Result Value Ref Range Status   Specimen Description BLOOD BLOOD LEFT ARM  Final   Special Requests   Final    BOTTLES DRAWN AEROBIC AND ANAEROBIC Blood Culture adequate volume   Culture   Final    NO GROWTH 5 DAYS Performed at [facility], [address] [place], [place] [address]    Report Status 06/20/2021 FINAL  Final  CULTURE, BLOOD (ROUTINE X 2) w Reflex to ID Panel     Status: None   Collection Time: 06/15/21 11:46 AM   Specimen: BLOOD  Result Value Ref Range Status   Specimen Description BLOOD BLOOD RIGHT ARM  Final   Special Requests   Final    BOTTLES DRAWN AEROBIC AND ANAEROBIC Blood Culture results may not be optimal due to an inadequate volume of blood received in culture bottles   Culture   Final    NO GROWTH 5 DAYS Performed at [facility], [address], [place], [place] [address]    Report Status 06/20/2021 FINAL  Final         Radiology Studies: No results found.      Scheduled Meds:  Chlorhexidine Gluconate Cloth  6 each Topical Daily   diazepam  2.5 mg Oral Q6H   diltiazem  60 mg Oral Q8H   enoxaparin (LOVENOX) injection  1 mg/kg Subcutaneous Q12H   feeding supplement (NEPRO CARB STEADY)  237 mL Oral TID BM   folic acid  1 mg Oral Daily   insulin aspart  0-6 Units Subcutaneous Q4H   insulin glargine-yfgn  10 Units Subcutaneous Daily   midodrine  10 mg Oral  TID WC   multivitamin with minerals  1 tablet Oral Daily   nicotine  21 mg Transdermal Daily   pantoprazole (PROTONIX) IV  40 mg Intravenous QHS   polyethylene glycol  17 g Oral Daily   potassium & sodium phosphates  1 packet Oral TID WC & HS   sodium chloride flush  10-40 mL Intracatheter Q12H   Continuous Infusions:  amiodarone 30 mg/hr (06/20/21 1100)   dexmedetomidine (PRECEDEX) IV infusion Stopped (06/18/21 1159)   lactated ringers Stopped (06/20/21 0943)   piperacillin-tazobactam (ZOSYN)  IV 12.5 mL/hr at 06/20/21 1100     LOS: 6 days    Time spent: 32 minutes    Sharen Hones, MD [facility]   To contact the attending provider between 7A-7P or the covering provider during after hours 7P-7A, please log into the web site [email or web address] and access using universal [place] password for that web site. If you do not have the password, please call the hospital operator.  06/20/2021, 11:49 AM

## 2021-06-20 NOTE — Progress Notes (Signed)
[facility] Cardiology    SUBJECTIVE: Patient doing much better complains of less tremors/shakes no headaches no back as per the syncope no chest pain.   Vitals:   06/20/21 0500 06/20/21 0600 06/20/21 0700 06/20/21 0800  BP: 124/68 129/62 (!) 126/57 120/71  Pulse: 96 89 88 93  Resp: '18 14 20 '$ (!) 22  Temp:  97.9 F (36.6 C)  98.7 F (37.1 C)  TempSrc:  Oral  Oral  SpO2: 100% 97% 97% 98%  Weight: 115.2 kg     Height:         Intake/Output Summary (Last 24 hours) at 06/20/2021 [id number] Last data filed at 06/20/2021 0400 Gross per 24 hour  Intake 3041.24 ml  Output 961 ml  Net 2080.24 ml      PHYSICAL EXAM  General: Well developed, well nourished, in no acute distress HEENT:  Normocephalic and atramatic Neck:  No JVD.  Lungs: Clear bilaterally to auscultation and percussion. Heart: HRRR . Normal S1 and S2 without gallops or murmurs.  Abdomen: Bowel sounds are positive, abdomen soft and non-tender  Msk:  Back normal, normal gait. Normal strength and tone for age. Extremities: No clubbing, cyanosis or edema.   Neuro: Alert and oriented X 3. Psych:  Good affect, responds appropriately   LABS: Basic Metabolic Panel: Recent Labs    06/18/21 0447 06/19/21 0530 06/19/21 0853 06/20/21 0529  NA 141  --  137  --   K 3.7  --  3.9  --   CL 110  --  104  --   CO2 28  --  20*  --   GLUCOSE 162*  --  261*  --   BUN 9  --  12  --   CREATININE 0.60*  --  0.88  --   CALCIUM 7.1*  --  7.2*  --   MG 1.5* 1.8  --  1.3*  PHOS 2.6 2.2*  --  2.5   Liver Function Tests: No results for input(s): AST, ALT, ALKPHOS, BILITOT, PROT, ALBUMIN in the last 72 hours. No results for input(s): LIPASE, AMYLASE in the last 72 hours. CBC: Recent Labs    06/18/21 0447 06/19/21 0853  WBC 9.4 18.2*  NEUTROABS 5.3  --   HGB 10.6* 9.7*  HCT 30.8* 27.9*  MCV 99.4 100.7*  PLT 175 249   Cardiac Enzymes: No results for input(s): CKTOTAL, CKMB, CKMBINDEX, TROPONINI in the last 72 hours. BNP: Invalid  input(s): POCBNP D-Dimer: No results for input(s): DDIMER in the last 72 hours. Hemoglobin A1C: No results for input(s): HGBA1C in the last 72 hours. Fasting Lipid Panel: No results for input(s): CHOL, HDL, LDLCALC, TRIG, CHOLHDL, LDLDIRECT in the last 72 hours. Thyroid Function Tests: No results for input(s): TSH, T4TOTAL, T3FREE, THYROIDAB in the last 72 hours.  Invalid input(s): FREET3 Anemia Panel: No results for input(s): VITAMINB12, FOLATE, FERRITIN, TIBC, IRON, RETICCTPCT in the last 72 hours.  No results found.   Echo preserved left ventricular function of 60%  TELEMETRY: Sinus tachycardia rate of around 110:  ASSESSMENT AND PLAN:  Principal Problem:   Bowel perforation (HCC) Active Problems:   Essential hypertension   Hypocalcemia   Hypomagnesemia   Alcohol use disorder, moderate, dependence (HCC)   Type 1 diabetes mellitus with hyperglycemia (HCC)   AF (paroxysmal atrial fibrillation) (HCC)   Terminal ileitis (HCC)   Hypotension Elevated white count  Plan Recommend transfer to telemetry Continue hypertension management and control Postop diverticulitis with perforation now with colostomy and Hartman's pouch  Elevated white count unclear continue broad-spectrum antibiotic therapy Continue diabetes management and control Tachycardia improved continue medication blockers Agree with diabetes management and control Paroxysmal atrial fibrillation transition to p.o. amiodarone. Recommend long-term anticoagulation with Eliquis Continue to manage withdrawal symptoms No invasive procedures scheduled   Yolonda Kida, MD, 06/20/2021 8:23 AM

## 2021-06-20 NOTE — Progress Notes (Signed)
Patient ID: Russell Gomez, male   DOB: 21-Jul-1961, 60 y.o.   MRN: [id number]     [facility] Day(s): 6.   Interval History: Patient seen and examined, no acute events or new complaints overnight. Patient reports feeling well this morning.  He denies nausea.  He endorses being able to drink water.  Denies abdominal pain.  Vital signs in last 24 hours: [min-max] current  Temp:  [97.9 F (36.6 C)-99 F (37.2 C)] 98.7 F (37.1 C) (08/13 0800) Pulse Rate:  [80-145] 80 (08/13 1000) Resp:  [14-27] 17 (08/13 1000) BP: (106-140)/(42-77) 118/61 (08/13 1000) SpO2:  [92 %-100 %] 97 % (08/13 1000) Weight:  [115.2 kg] 115.2 kg (08/13 0500)     Height: '6\' 2"'$  (188 cm) Weight: 115.2 kg BMI (Calculated): 32.59   Physical Exam:  Constitutional: alert, cooperative and no distress  Respiratory: breathing non-labored at rest  Cardiovascular: regular rate and sinus rhythm  Gastrointestinal: soft, non-tender, and non-distended.  Colostomy pink and patent with abundant stool in the ostomy bag.  Labs:  CBC Latest Ref Rng & Units 06/19/2021 06/18/2021 06/17/2021  WBC 4.0 - 10.5 K/uL 18.2(H) 9.4 7.7  Hemoglobin 13.0 - 17.0 g/dL 9.7(L) 10.6(L) 9.6(L)  Hematocrit 39.0 - 52.0 % 27.9(L) 30.8(L) 28.7(L)  Platelets 150 - 400 K/uL 249 175 173   CMP Latest Ref Rng & Units 06/19/2021 06/18/2021 06/17/2021  Glucose 70 - 99 mg/dL 261(H) 162(H) 206(H)  BUN 6 - 20 mg/dL '12 9 12  '$ Creatinine 0.61 - 1.24 mg/dL 0.88 0.60(L) 0.62  Sodium 135 - 145 mmol/L 137 141 138  Potassium 3.5 - 5.1 mmol/L 3.9 3.7 3.6  Chloride 98 - 111 mmol/L 104 110 104  CO2 22 - 32 mmol/L 20(L) 28 30  Calcium 8.9 - 10.3 mg/dL 7.2(L) 7.1(L) 7.3(L)  Total Protein 6.5 - 8.1 g/dL - - 4.4(L)  Total Bilirubin 0.3 - 1.2 mg/dL - - 0.9  Alkaline Phos 38 - 126 U/L - - 223(H)  AST 15 - 41 U/L - - 58(H)  ALT 0 - 44 U/L - - 22    Imaging studies: No new pertinent imaging studies   Assessment/Plan:  60 y.o. male with diverticulitis  with perforation 6 Day Post-Op s/p Hartman's procedure, complicated by pertinent comorbidities including type 1 diabetes, A. fib and on anticoagulation, chronic alcohol abuse.  Patient is morning seems to be comfortable.  Patient more awake and alert.  Patient in no distress.  Abdomen is soft and depressible.  Ostomy bag with abundant stool in the bag.  Patient has been drinking water without nausea.  We will try clear liquid again.  Encourage the patient to ambulate.  Appreciate hospitalist and cardiology management of this patient medical comorbidities.  I will continue to follow closely.  Arnold Long, MD

## 2021-06-20 NOTE — Progress Notes (Signed)
Pt transferred to 2A. Vitals stable, remains in afib - rate controlled. No complaints verbalized. Tolerated clear liquids well all day. Denies any nausea, no vomiting noted. Colostomy emptied several times during the day. Condom / external catheter in place, adequate output noted.

## 2021-06-20 NOTE — Progress Notes (Signed)
Patient with increase colostomy output since 11pm, and increased output from JP drain. Patient had incontinent urine episode. 0600 bladder scan was 71cc. HR more controlled. Still on Cardizem IV and PO dose was given this morning. IVF maintained. Call bell within reach. Will endorse.

## 2021-06-21 LAB — CBC WITH DIFFERENTIAL/PLATELET
Abs Immature Granulocytes: 0.84 10*3/uL — ABNORMAL HIGH (ref 0.00–0.07)
Basophils Absolute: 0.1 10*3/uL (ref 0.0–0.1)
Basophils Relative: 1 %
Eosinophils Absolute: 0.2 10*3/uL (ref 0.0–0.5)
Eosinophils Relative: 1 %
HCT: 27.5 % — ABNORMAL LOW (ref 39.0–52.0)
Hemoglobin: 9.7 g/dL — ABNORMAL LOW (ref 13.0–17.0)
Immature Granulocytes: 6 %
Lymphocytes Relative: 19 %
Lymphs Abs: 2.7 10*3/uL (ref 0.7–4.0)
MCH: 33.8 pg (ref 26.0–34.0)
MCHC: 35.3 g/dL (ref 30.0–36.0)
MCV: 95.8 fL (ref 80.0–100.0)
Monocytes Absolute: 1 10*3/uL (ref 0.1–1.0)
Monocytes Relative: 7 %
Neutro Abs: 9.3 10*3/uL — ABNORMAL HIGH (ref 1.7–7.7)
Neutrophils Relative %: 66 %
Platelets: 282 10*3/uL (ref 150–400)
RBC: 2.87 MIL/uL — ABNORMAL LOW (ref 4.22–5.81)
RDW: 16.7 % — ABNORMAL HIGH (ref 11.5–15.5)
WBC: 13.9 10*3/uL — ABNORMAL HIGH (ref 4.0–10.5)
nRBC: 0.2 % (ref 0.0–0.2)

## 2021-06-21 LAB — GLUCOSE, CAPILLARY
Glucose-Capillary: 140 mg/dL — ABNORMAL HIGH (ref 70–99)
Glucose-Capillary: 177 mg/dL — ABNORMAL HIGH (ref 70–99)
Glucose-Capillary: 248 mg/dL — ABNORMAL HIGH (ref 70–99)
Glucose-Capillary: 286 mg/dL — ABNORMAL HIGH (ref 70–99)
Glucose-Capillary: 326 mg/dL — ABNORMAL HIGH (ref 70–99)
Glucose-Capillary: 380 mg/dL — ABNORMAL HIGH (ref 70–99)

## 2021-06-21 LAB — BASIC METABOLIC PANEL
Anion gap: 8 (ref 5–15)
BUN: 8 mg/dL (ref 6–20)
CO2: 26 mmol/L (ref 22–32)
Calcium: 7 mg/dL — ABNORMAL LOW (ref 8.9–10.3)
Chloride: 104 mmol/L (ref 98–111)
Creatinine, Ser: 0.64 mg/dL (ref 0.61–1.24)
GFR, Estimated: >60 mL/min (ref >60)
Glucose, Bld: 209 mg/dL — ABNORMAL HIGH (ref 70–99)
Potassium: 3 mmol/L — ABNORMAL LOW (ref 3.5–5.1)
Sodium: 138 mmol/L (ref 135–145)

## 2021-06-21 LAB — MAGNESIUM: Magnesium: 1.6 mg/dL — ABNORMAL LOW (ref 1.7–2.4)

## 2021-06-21 LAB — PHOSPHORUS: Phosphorus: 3 mg/dL (ref 2.5–4.6)

## 2021-06-21 MED ORDER — MAGNESIUM SULFATE 2 GM/50ML IV SOLN
2.0000 g | Freq: Once | INTRAVENOUS | Status: AC
  Administered 2021-06-21: 2 g via INTRAVENOUS
  Filled 2021-06-21: qty 50

## 2021-06-21 MED ORDER — AMOXICILLIN-POT CLAVULANATE 875-125 MG PO TABS
1.0000 | ORAL_TABLET | Freq: Two times a day (BID) | ORAL | Status: DC
  Administered 2021-06-21 – 2021-06-23 (×6): 1 via ORAL
  Filled 2021-06-21 (×6): qty 1

## 2021-06-21 MED ORDER — GLUCERNA SHAKE PO LIQD
237.0000 mL | Freq: Two times a day (BID) | ORAL | Status: DC
  Administered 2021-06-21 – 2021-06-25 (×6): 237 mL via ORAL

## 2021-06-21 MED ORDER — DILTIAZEM HCL ER COATED BEADS 180 MG PO CP24
180.0000 mg | ORAL_CAPSULE | Freq: Every day | ORAL | Status: DC
  Administered 2021-06-21 – 2021-06-25 (×5): 180 mg via ORAL
  Filled 2021-06-21 (×5): qty 1

## 2021-06-21 MED ORDER — POTASSIUM CHLORIDE 10 MEQ/100ML IV SOLN
10.0000 meq | INTRAVENOUS | Status: AC
Start: 2021-06-21 — End: 2021-06-21
  Administered 2021-06-21 (×2): 10 meq via INTRAVENOUS
  Filled 2021-06-21 (×2): qty 100

## 2021-06-21 MED ORDER — INSULIN GLARGINE-YFGN 100 UNIT/ML ~~LOC~~ SOLN
20.0000 [IU] | Freq: Two times a day (BID) | SUBCUTANEOUS | Status: DC
  Administered 2021-06-21: 20 [IU] via SUBCUTANEOUS
  Filled 2021-06-21 (×3): qty 0.2

## 2021-06-21 NOTE — Progress Notes (Signed)
Russell Gomez    SUBJECTIVE: Patient states he feels much better less tremors/shakiness more oriented denies any pain no shortness of breath sitting up in a chair eating breakfast   Vitals:   06/20/21 1700 06/20/21 1856 06/20/21 1925 06/21/21 0529  BP: (!) 124/58 125/60 126/66 126/63  Pulse: 80 70 70 75  Resp: '16 18 18 17  '$ Temp:  98.3 F (36.8 C) 98.3 F (36.8 C) 98.3 F (36.8 C)  TempSrc:  Oral  Oral  SpO2: 96% 96% 97% 96%  Weight:      Height:         Intake/Output Summary (Last 24 hours) at 06/21/2021 [id number] Last data filed at 06/21/2021 [id number] Gross per 24 hour  Intake 1226.78 ml  Output 1860 ml  Net -633.22 ml      PHYSICAL EXAM  General: Well developed, well nourished, in no acute distress HEENT:  Normocephalic and atramatic Neck:  No JVD.  Lungs: Clear bilaterally to auscultation and percussion. Heart: HRRR . Normal S1 and S2 without gallops or murmurs.  Abdomen: Bowel sounds are positive, abdomen soft and non-tender  Msk:  Back normal, normal gait. Normal strength and tone for age. Extremities: No clubbing, cyanosis or edema.   Neuro: Alert and oriented X 3. Psych:  Good affect, responds appropriately   LABS: Basic Metabolic Panel: Recent Labs    06/19/21 0853 06/20/21 0529 06/21/21 0530  NA 137  --  138  K 3.9  --  3.0*  CL 104  --  104  CO2 20*  --  26  GLUCOSE 261*  --  209*  BUN 12  --  8  CREATININE 0.88  --  0.64  CALCIUM 7.2*  --  7.0*  MG  --  1.3* 1.6*  PHOS  --  2.5 3.0   Liver Function Tests: No results for input(s): AST, ALT, ALKPHOS, BILITOT, PROT, ALBUMIN in the last 72 hours. No results for input(s): LIPASE, AMYLASE in the last 72 hours. CBC: Recent Labs    06/19/21 0853 06/21/21 0530  WBC 18.2* 13.9*  NEUTROABS  --  9.3*  HGB 9.7* 9.7*  HCT 27.9* 27.5*  MCV 100.7* 95.8  PLT 249 282   Cardiac Enzymes: No results for input(s): CKTOTAL, CKMB, CKMBINDEX, TROPONINI in the last 72 hours. BNP: Invalid input(s):  POCBNP D-Dimer: No results for input(s): DDIMER in the last 72 hours. Hemoglobin A1C: No results for input(s): HGBA1C in the last 72 hours. Fasting Lipid Panel: No results for input(s): CHOL, HDL, LDLCALC, TRIG, CHOLHDL, LDLDIRECT in the last 72 hours. Thyroid Function Tests: No results for input(s): TSH, T4TOTAL, T3FREE, THYROIDAB in the last 72 hours.  Invalid input(s): FREET3 Anemia Panel: No results for input(s): VITAMINB12, FOLATE, FERRITIN, TIBC, IRON, RETICCTPCT in the last 72 hours.  No results found.   Echo history of echo from 222 with normal left ventricular function EF of 60%  TELEMETRY: Atrial fibrillation rate of 80:  ASSESSMENT AND PLAN:  Principal Problem:   Bowel perforation (HCC) Active Problems:   Essential hypertension   Hypocalcemia   Hypomagnesemia   Alcohol use disorder, moderate, dependence (HCC)   Type 1 diabetes mellitus with hyperglycemia (HCC)   AF (paroxysmal atrial fibrillation) (HCC)   Terminal ileitis ([place])   Hypotension    Plan Continue maintain telemetry for tachycardia paroxysmal A. Fib Anticoagulation for A. fib amiodarone p.o. Rate control for tachycardia Withdrawal DTs continue treatment Postop abscess perforation now with colostomy continue antibiotics Elevated white count continue antibiotic therapy  Agree with Eliquis for anticoagulation therapy Continue diabetes management and control   Yolonda Kida, MD 06/21/2021 8:11 AM

## 2021-06-21 NOTE — Evaluation (Signed)
Occupational Therapy Evaluation Patient Details Name: Russell Gomez MRN: [id number] DOB: 08-03-1961 Today's Date: 06/21/2021    History of Present Illness Pt is a 60 y/o M who presented to [facility] on ER on 06/14/21 with a 3 day hx of lower abdominal pain. CT revealed diverticular disease with perforation. Pt underwent emergent sigmoid colon resection with colostomy & appendectomy. On 06/16/21 his hospital course was comlicated by severe ETOH withdrawal symptoms. Pt is currently being treated for paroxysmal a-fib with rapid major response. PMH: chronic EtOH abuse, uncontrolled DM1, high cholesterol, HTN   Clinical Impression   Patient presenting with decreased I in self care, balance, functional mobility/transfer, endurance,and safety awareness. Patient reports living at home alone PTA. 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  Patient currently functioning at mod - max A overall for bed mobility, stand pivot transfer, and self care tasks. Pt fatigues very quickly and shows insight to weakness and current situation. Pt is very pleasant and cooperative and motivated for therapeutic intervention.  Patient will benefit from acute OT to increase overall independence in the areas of ADLs, functional mobility, and safety awareness in order to safely discharge to next venue of care.     Follow Up Recommendations  CIR    Equipment Recommendations  Other (comment) (defer to next venue of care)       Precautions / Restrictions Precautions Precautions: Fall Precaution Comments: ostomy bag, JP drain Restrictions Weight Bearing Restrictions: No      Mobility Bed Mobility Overal bed mobility: Needs Assistance Bed Mobility: Supine to Sit     Supine to sit: Mod assist     General bed mobility comments: not observed, pt received & left sitting up in recliner    Transfers Overall transfer level: Needs assistance Equipment used: Rolling walker (2 wheeled) Transfers: Sit  to/from [facility] Sit to Stand: Mod assist Stand pivot transfers: Mod assist       General transfer comment: cuing to scoot out to edge of seat, assistance with BLE foot placement for wide BOS, cuing for anterior weight shifting, attempted sit>stand & unable to achieve full sit>stand from low recliner despite max assist +1 & multiple attempts    Balance Overall balance assessment: Needs assistance Sitting-balance support: Feet supported;Bilateral upper extremity supported Sitting balance-Leahy Scale: Fair     Standing balance support: During functional activity Standing balance-Leahy Scale: Poor                             ADL either performed or assessed with clinical judgement   ADL Overall ADL's : Needs assistance/impaired     Grooming: Wash/dry hands;Wash/dry face;Sitting;Set up               Lower Body Dressing: Moderate assistance;Maximal assistance   Toilet Transfer: Moderate assistance;RW           Functional mobility during ADLs: Moderate assistance;Rolling walker       Vision Baseline Vision/History: No visual deficits              Pertinent Vitals/Pain Pain Assessment: No/denies pain     Hand Dominance Right   Extremity/Trunk Assessment Upper Extremity Assessment Upper Extremity Assessment: Generalized weakness   Lower Extremity Assessment Lower Extremity Assessment: Generalized weakness       Communication Communication Communication: No difficulties   Cognition Arousal/Alertness: Awake/alert Behavior During Therapy: WFL for tasks assessed/performed Overall Cognitive Status: Within Functional Limits for tasks assessed  General Comments: Agreeable to tx   General Comments  Observed liquid on gown on R side of abdomen but no visbile drainage from abdomen - notified nursing staff    Exercises General Exercises - Lower Extremity Long Arc Quad:  AROM;Strengthening;Both;10 reps;Seated Hip Flexion/Marching: AROM;Strengthening;Both;5 reps;Seated        Home Living Family/patient expects to be discharged to:: Private residence Living Arrangements: Alone   Type of Home: House Home Access: Stairs to enter Technical brewer of Steps: 3 Entrance Stairs-Rails: Left;Right Home Layout: One level     Bathroom Shower/Tub: Tub/shower unit         Home Equipment: None          Prior Functioning/Environment Level of Independence: Independent                 OT Problem List: Decreased strength;Decreased activity tolerance;Impaired balance (sitting and/or standing);Decreased safety awareness      OT Treatment/Interventions: Self-care/ADL training;Modalities;Balance training;Therapeutic exercise;Therapeutic activities;Energy conservation;Patient/family education;DME and/or AE instruction    OT Goals(Current goals can be found in the care plan section) Acute Rehab OT Goals Patient Stated Goal: get better OT Goal Formulation: With patient Time For Goal Achievement: 07/05/21 Potential to Achieve Goals: Good ADL Goals Pt Will Perform Grooming: with supervision;standing Pt Will Perform Lower Body Dressing: with supervision Pt Will Transfer to Toilet: with supervision Pt Will Perform Toileting - Clothing Manipulation and hygiene: with supervision  OT Frequency: Min 3X/week   Barriers to D/C:    lives alone          AM-PAC OT "6 Clicks" Daily Activity     Outcome Measure Help from another person eating meals?: None Help from another person taking care of personal grooming?: A Little Help from another person toileting, which includes using toliet, bedpan, or urinal?: A Lot Help from another person bathing (including washing, rinsing, drying)?: A Lot Help from another person to put on and taking off regular upper body clothing?: A Little Help from another person to put on and taking off regular lower body  clothing?: A Lot 6 Click Score: 16   End of Session Equipment Utilized During Treatment: Rolling walker Nurse Communication: Mobility status  Activity Tolerance: Patient limited by fatigue Patient left: in chair;with call bell/phone within reach;with chair alarm set  OT Visit Diagnosis: Unsteadiness on feet (R26.81);Muscle weakness (generalized) (M62.81)                Time: [id number] OT Time Calculation (min): 25 min Charges:  OT General Charges $OT Visit: 1 Visit OT Evaluation $OT Eval Moderate Complexity: 1 Mod OT Treatments $Self Care/Home Management : 8-22 mins  Darleen Crocker, MS, OTR/L , CBIS ascom [phone]  06/21/21, 1:22 PM

## 2021-06-21 NOTE — Progress Notes (Signed)
Patient ID: PHOENYX GALLARDO, male   DOB: [birth year], 60 y.o.   MRN: [id number]     [facility] Hospital Day(s): 7.   Interval History: Patient seen and examined, no acute events or new complaints overnight. Patient reports feeling well. Denies nausea. Reports having a lot of "farting".  Vital signs in last 24 hours: [min-max] current  Temp:  [98.3 F (36.8 C)-98.5 F (36.9 C)] 98.3 F (36.8 C) (08/14 0529) Pulse Rate:  [70-95] 75 (08/14 0529) Resp:  [15-23] 17 (08/14 0529) BP: (116-129)/(53-76) 126/63 (08/14 0529) SpO2:  [94 %-99 %] 96 % (08/14 0529) Weight:  [113.5 kg] 113.5 kg (08/13 1400)     Height: '6\' 2"'$  (188 cm) Weight: 113.5 kg BMI (Calculated): 32.11   Physical Exam:  Constitutional: alert, cooperative and no distress  Respiratory: breathing non-labored at rest  Cardiovascular: regular rate and sinus rhythm  Gastrointestinal: soft, non-tender, and non-distended. Ostomy pink and patent  Labs:  CBC Latest Ref Rng & Units 06/21/2021 06/19/2021 06/18/2021  WBC 4.0 - 10.5 K/uL 13.9(H) 18.2(H) 9.4  Hemoglobin 13.0 - 17.0 g/dL 9.7(L) 9.7(L) 10.6(L)  Hematocrit 39.0 - 52.0 % 27.5(L) 27.9(L) 30.8(L)  Platelets 150 - 400 K/uL 282 249 175   CMP Latest Ref Rng & Units 06/21/2021 06/19/2021 06/18/2021  Glucose 70 - 99 mg/dL 209(H) 261(H) 162(H)  BUN 6 - 20 mg/dL '8 12 9  '$ Creatinine 0.61 - 1.24 mg/dL 0.64 0.88 0.60(L)  Sodium 135 - 145 mmol/L 138 137 141  Potassium 3.5 - 5.1 mmol/L 3.0(L) 3.9 3.7  Chloride 98 - 111 mmol/L 104 104 110  CO2 22 - 32 mmol/L 26 20(L) 28  Calcium 8.9 - 10.3 mg/dL 7.0(L) 7.2(L) 7.1(L)  Total Protein 6.5 - 8.1 g/dL - - -  Total Bilirubin 0.3 - 1.2 mg/dL - - -  Alkaline Phos 38 - 126 U/L - - -  AST 15 - 41 U/L - - -  ALT 0 - 44 U/L - - -    Imaging studies: No new pertinent imaging studies   Assessment/Plan:  60 y.o. male with diverticulitis with perforation 6 Day Post-Op s/p Hartman's procedure, complicated by pertinent comorbidities  including type 1 diabetes, A. fib and on anticoagulation, chronic alcohol abuse.  Patient looking much better. Calm, no distress. Ostomy working. No fever. Tolerated clear liquid. No nausea. Will advance diet to full liquid. Encourage the patient to ambulate.   Arnold Long, MD

## 2021-06-21 NOTE — Progress Notes (Signed)
Inpatient Rehab Admissions Coordinator Note:   Per PT/Ot recommendations, pt was screened for CIR candidacy by Gayland Curry, MS, CCC-SLP.  At this time we are not recommending an inpatient rehab consult.  Pt demonstrated limited tolerance of mobility during evaluations.  Will continue to follow from a distance pt's tolerance and progress with therapies.  Please contact me with questions.    Gayland Curry, [place], [place] Admissions Coordinator [phone] 06/21/21 5:36 PM

## 2021-06-21 NOTE — Progress Notes (Signed)
PROGRESS NOTE    Russell Gomez  [id number] DOB: Jul 24, 1961 DOA: 06/14/2021 PCP: Ria Bush, MD   Chief complaint.  Abdominal pain. Brief Narrative:   60 yo male who presented to [facility] ER on 08/7 with a 3 day hx of lower abdominal pain.  He does have a hx of chronic ETOH abuse, however reported his last drink was 08/4.  CT Abd Pelvis revealed diverticular disease with perforation.  Therefore, General Surgery consulted and pt underwent emergent sigmoid colon resection with colostomy (Hartmann's procedure) and appendectomy.  He was subsequently admitted to the stepdown unit postop for additional workup and treatment.  However, on 08/9 hospital course complicated by severe ETOH withdrawal symptoms despite administration of ativan per CIWA protocol.  Therefore, precedex gtt ordered and PCCM team consulted to assist with plan of care. Patient was transferred to medical floor on 8/11.   Assessment & Plan:   Principal Problem:   Bowel perforation (HCC) Active Problems:   Essential hypertension   Hypocalcemia   Hypomagnesemia   Alcohol use disorder, moderate, dependence (HCC)   Type 1 diabetes mellitus with hyperglycemia (HCC)   AF (paroxysmal atrial fibrillation) (HCC)   Terminal ileitis (HCC)   Hypotension  #1.  Paroxysmal atrial fibrillation with rapid major response. Hypotension secondary to tachycardia. Condition had improved, blood pressures normalized, patient has converted to sinus rhythm.  I will continue oral amiodarone at 400 mg twice a day.  Continue diltiazem 180 mg daily.  2.  Alcohol withdrawal with delirium tremens. Aspiration pneumonia secondary to alcohol withdrawal. Condition had improved, will change antibiotic to Augmentin.  #3.  Diverticulosis with perforation. Status post a Hartmann procedure.  Condition improving, patient had more loose stools, no nausea vomiting tolerating liquid diet.  Diet advanced to full liquid per general surgery pain  4.  Type 1  diabetes uncontrolled with hyperglycemia. Increase Lantus to 20 units twice a day.  #5.  Hypokalemia Hypomagnesemia Hypophosphatemia. Potassium magnesium repleted with IV bolus  #6.  Severe hypoalbuminemia. Start protein supplements   DVT prophylaxis: Lovenox Code Status: full Family Communication:  Disposition Plan:    Status is: Inpatient  Remains inpatient appropriate because:Inpatient level of care appropriate due to severity of illness  Dispo: The patient is from: Home              Anticipated d/c is to: Home              Patient currently is not medically stable to d/c.   Difficult to place patient No        I/O last 3 completed shifts: In: 2633.2 [P.O.:800; I.V.:1509.5; IV Piggyback:323.7] Out: 2500 [Urine:600; Drains:500; [id number] Total I/O In: 600 [P.O.:600] Out: 36 [Drains:180; Stool:600]     Consultants:  General surgery  Procedures: None  Antimicrobials: Augmentin  Subjective: Patient doing much better, no nausea vomiting anymore.  No abdominal pain.  He has more loose stools. Denies any abdominal pain nausea vomiting. No short of breath or palpitation.  No cough No dysuria hematuria pain No fever or chills. No headache or dizziness. No chest pain.  Objective: Vitals:   06/20/21 1925 06/21/21 0529 06/21/21 0814 06/21/21 1140  BP: 126/66 126/63 (!) 112/57 (!) 113/56  Pulse: 70 75 75 83  Resp: '18 17 17 18  '$ Temp: 98.3 F (36.8 C) 98.3 F (36.8 C) 98.1 F (36.7 C) 98.1 F (36.7 C)  TempSrc:  Oral    SpO2: 97% 96% 98% 98%  Weight:      Height:  Intake/Output Summary (Last 24 hours) at 06/21/2021 1325 Last data filed at 06/21/2021 1213 Gross per 24 hour  Intake 1041.5 ml  Output 2180 ml  Net -1138.5 ml   Filed Weights   06/19/21 0500 06/20/21 0500 06/20/21 1400  Weight: 112.6 kg 115.2 kg 113.5 kg    Examination:  General exam: Appears calm and comfortable  Respiratory system: Clear to auscultation. Respiratory  effort normal. Cardiovascular system: S1 & S2 heard, RRR. No JVD, murmurs, rubs, gallops or clicks. No pedal edema. Gastrointestinal system: Abdomen is nondistended, soft and nontender. No organomegaly or masses felt. Normal bowel sounds heard. Central nervous system: Alert and oriented. No focal neurological deficits. Extremities: Symmetric 5 x 5 power. Skin: No rashes, lesions or ulcers Psychiatry: Judgement and insight appear normal. Mood & affect appropriate.     Data Reviewed: I have personally reviewed following labs and imaging studies  CBC: Recent Labs  Lab 06/16/21 0431 06/17/21 0447 06/18/21 0447 06/19/21 0853 06/21/21 0530  WBC 11.7* 7.7 9.4 18.2* 13.9*  NEUTROABS  --   --  5.3  --  9.3*  HGB 11.6* 9.6* 10.6* 9.7* 9.7*  HCT 33.8* 28.7* 30.8* 27.9* 27.5*  MCV 98.5 99.7 99.4 100.7* 95.8  PLT 281 173 175 249 [id number]   Basic Metabolic Panel: Recent Labs  Lab 06/16/21 0431 06/16/21 1941 06/17/21 0447 06/17/21 1900 06/18/21 0447 06/19/21 0530 06/19/21 0853 06/20/21 0529 06/21/21 0530  NA 137  --  138  --  141  --  137  --  138  K 3.4* 3.3* 3.6  --  3.7  --  3.9  --  3.0*  CL 103  --  104  --  110  --  104  --  104  CO2 27  --  30  --  28  --  20*  --  26  GLUCOSE 69*  --  206*  --  162*  --  261*  --  209*  BUN 17  --  12  --  9  --  12  --  8  CREATININE 0.75  --  0.62  --  0.60*  --  0.88  --  0.64  CALCIUM 7.2*  --  7.3*  --  7.1*  --  7.2*  --  7.0*  MG 1.9  --  1.5* 1.7 1.5* 1.8  --  1.3* 1.6*  PHOS 2.1* 3.7 3.5  --  2.6 2.2*  --  2.5 3.0   GFR: Estimated Creatinine Clearance: 134.8 mL/min (by C-G formula based on SCr of 0.64 mg/dL). Liver Function Tests: Recent Labs  Lab 06/16/21 0431 06/17/21 0447  AST 43* 58*  ALT 19 22  ALKPHOS 178* 223*  BILITOT 1.3* 0.9  PROT 5.1* 4.4*  ALBUMIN 1.6* 1.4*   No results for input(s): LIPASE, AMYLASE in the last 168 hours. No results for input(s): AMMONIA in the last 168 hours. Coagulation Profile: No results  for input(s): INR, PROTIME in the last 168 hours. Cardiac Enzymes: No results for input(s): CKTOTAL, CKMB, CKMBINDEX, TROPONINI in the last 168 hours. BNP (last 3 results) No results for input(s): PROBNP in the last 8760 hours. HbA1C: No results for input(s): HGBA1C in the last 72 hours. CBG: Recent Labs  Lab 06/20/21 1624 06/20/21 1926 06/21/21 0011 06/21/21 0815 06/21/21 1141  GLUCAP 102* 113* 140* 248* 380*   Lipid Profile: No results for input(s): CHOL, HDL, LDLCALC, TRIG, CHOLHDL, LDLDIRECT in the last 72 hours. Thyroid Function Tests: No results for input(s):  TSH, T4TOTAL, FREET4, T3FREE, THYROIDAB in the last 72 hours. Anemia Panel: No results for input(s): VITAMINB12, FOLATE, FERRITIN, TIBC, IRON, RETICCTPCT in the last 72 hours. Sepsis Labs: Recent Labs  Lab 06/15/21 0800  LATICACIDVEN 1.0    Recent Results (from the past 240 hour(s))  Resp Panel by RT-PCR (Flu A&B, Covid) Nasopharyngeal Swab     Status: None   Collection Time: 06/14/21  3:48 AM   Specimen: Nasopharyngeal Swab; Nasopharyngeal(NP) swabs in vial transport medium  Result Value Ref Range Status   SARS Coronavirus 2 by RT PCR NEGATIVE NEGATIVE Final    Comment: (NOTE) SARS-CoV-2 target nucleic acids are NOT DETECTED.  The SARS-CoV-2 RNA is generally detectable in upper respiratory specimens during the acute phase of infection. The lowest concentration of SARS-CoV-2 viral copies this assay can detect is 138 copies/mL. A negative result does not preclude SARS-Cov-2 infection and should not be used as the sole basis for treatment or other patient management decisions. A negative result may occur with  improper specimen collection/handling, submission of specimen other than nasopharyngeal swab, presence of viral mutation(s) within the areas targeted by this assay, and inadequate number of viral copies(<138 copies/mL). A negative result must be combined with clinical observations, patient history, and  epidemiological information. The expected result is Negative.  Fact Sheet for Patients:  EntrepreneurPulse.com.au  Fact Sheet for Healthcare Providers:  IncredibleEmployment.be  This test is no t yet approved or cleared by the [place] FDA and  has been authorized for detection and/or diagnosis of SARS-CoV-2 by FDA under an Emergency Use Authorization (EUA). This EUA will remain  in effect (meaning this test can be used) for the duration of the COVID-19 declaration under Section 564(b)(1) of the Act, 21 U.S.C.section 360bbb-3(b)(1), unless the authorization is terminated  or revoked sooner.       Influenza A by PCR NEGATIVE NEGATIVE Final   Influenza B by PCR NEGATIVE NEGATIVE Final    Comment: (NOTE) The Xpert Xpress SARS-CoV-2/FLU/RSV plus assay is intended as an aid in the diagnosis of influenza from Nasopharyngeal swab specimens and should not be used as a sole basis for treatment. Nasal washings and aspirates are unacceptable for Xpert Xpress SARS-CoV-2/FLU/RSV testing.  Fact Sheet for Patients: EntrepreneurPulse.com.au  Fact Sheet for Healthcare Providers: IncredibleEmployment.be  This test is not yet approved or cleared by the [place] FDA and has been authorized for detection and/or diagnosis of SARS-CoV-2 by FDA under an Emergency Use Authorization (EUA). This EUA will remain in effect (meaning this test can be used) for the duration of the COVID-19 declaration under Section 564(b)(1) of the Act, 21 U.S.C. section 360bbb-3(b)(1), unless the authorization is terminated or revoked.  Performed at [facility], [address] [place], [place] [address]   MRSA Next Gen by PCR, Nasal     Status: None   Collection Time: 06/14/21 10:10 AM   Specimen: Nasal Mucosa; Nasal Swab  Result Value Ref Range Status   MRSA by PCR Next Gen NOT DETECTED NOT DETECTED Final    Comment:  (NOTE) The GeneXpert MRSA Assay (FDA approved for NASAL specimens only), is one component of a comprehensive MRSA colonization surveillance program. It is not intended to diagnose MRSA infection nor to guide or monitor treatment for MRSA infections. Test performance is not FDA approved in patients less than 9 years old. Performed at [facility], [place], [place] [address]   CULTURE, BLOOD (ROUTINE X 2) w Reflex to ID Panel  Status: None   Collection Time: 06/15/21 11:46 AM   Specimen: BLOOD  Result Value Ref Range Status   Specimen Description BLOOD BLOOD LEFT ARM  Final   Special Requests   Final    BOTTLES DRAWN AEROBIC AND ANAEROBIC Blood Culture adequate volume   Culture   Final    NO GROWTH 5 DAYS Performed at [facility], [address] [place], [place] [address]    Report Status 06/20/2021 FINAL  Final  CULTURE, BLOOD (ROUTINE X 2) w Reflex to ID Panel     Status: None   Collection Time: 06/15/21 11:46 AM   Specimen: BLOOD  Result Value Ref Range Status   Specimen Description BLOOD BLOOD RIGHT ARM  Final   Special Requests   Final    BOTTLES DRAWN AEROBIC AND ANAEROBIC Blood Culture results may not be optimal due to an inadequate volume of blood received in culture bottles   Culture   Final    NO GROWTH 5 DAYS Performed at [facility], [address] [place], [place] [address]    Report Status 06/20/2021 FINAL  Final         Radiology Studies: No results found.      Scheduled Meds:  amiodarone  400 mg Oral BID   apixaban  5 mg Oral BID   Chlorhexidine Gluconate Cloth  6 each Topical Daily   diazepam  2.5 mg Oral Q6H   diltiazem  60 mg Oral Q8H   feeding supplement (NEPRO CARB STEADY)  237 mL Oral TID BM   folic acid  1 mg Oral Daily   insulin aspart  0-6 Units Subcutaneous Q4H   insulin glargine-yfgn  10 Units Subcutaneous Daily   midodrine  10 mg Oral TID WC   multivitamin with minerals  1 tablet  Oral Daily   nicotine  21 mg Transdermal Daily   pantoprazole (PROTONIX) IV  40 mg Intravenous QHS   polyethylene glycol  17 g Oral Daily   potassium & sodium phosphates  1 packet Oral TID WC & HS   sodium chloride flush  10-40 mL Intracatheter Q12H   Continuous Infusions:  piperacillin-tazobactam (ZOSYN)  IV 3.375 g (06/21/21 0801)     LOS: 7 days    Time spent: 32 minutes    Sharen Hones, MD [facility]   To contact the attending provider between 7A-7P or the covering provider during after hours 7P-7A, please log into the web site [email or web address] and access using universal [place] password for that web site. If you do not have the password, please call the hospital operator.  06/21/2021, 1:25 PM

## 2021-06-21 NOTE — Evaluation (Addendum)
Physical Therapy Evaluation Patient Details Name: Russell Gomez MRN: [id number] DOB: 08/08/1961 Today's Date: 06/21/2021   History of Present Illness  Pt is a 60 y/o M who presented to [facility] on ER on 06/14/21 with a 3 day hx of lower abdominal pain. CT revealed diverticular disease with perforation. Pt underwent emergent sigmoid colon resection with colostomy & appendectomy. On 06/16/21 his hospital course was comlicated by severe ETOH withdrawal symptoms. Pt is currently being treated for paroxysmal a-fib with rapid major response. PMH: chronic EtOH abuse, uncontrolled DM1, high cholesterol, HTN  Clinical Impression  Pt seen for PT evaluation with pt received in recliner. Attempted sit>stand multiple times but pt unable to achieve full upright standing with max assist +1. Pt performs BLE strengthening exercises with instructional cuing for technique with pt demonstrating significant global BLE weakness. Pt would benefit from f/u therapy services & would benefit from intensive therapies to return to PLOF.     Follow Up Recommendations CIR    Equipment Recommendations  None recommended by PT (TBD in next venue)    Recommendations for Other Services       Precautions / Restrictions Precautions Precautions: Fall Precaution Comments: ostomy bag, JP drain Restrictions Weight Bearing Restrictions: No      Mobility  Bed Mobility               General bed mobility comments: not observed, pt received & left sitting up in recliner    Transfers Overall transfer level: Needs assistance Equipment used: Rolling walker (2 wheeled) Transfers: Sit to/from Stand Sit to Stand: Total assist         General transfer comment: cuing to scoot out to edge of seat, assistance with BLE foot placement for wide BOS, cuing for anterior weight shifting, attempted sit>stand & unable to achieve full sit>stand from low recliner despite max assist +1 & multiple attempts  Ambulation/Gait                 Stairs            Wheelchair Mobility    Modified Rankin (Stroke Patients Only)       Balance                                             Pertinent Vitals/Pain Pain Assessment: No/denies pain    Home Living Family/patient expects to be discharged to:: Private residence Living Arrangements: Alone   Type of Home: House Home Access: Stairs to enter Entrance Stairs-Rails: Left;Right (wideset) Technical brewer of Steps: 3 Home Layout: One level Home Equipment: None      Prior Function Level of Independence: Independent               Hand Dominance        Extremity/Trunk Assessment   Upper Extremity Assessment Upper Extremity Assessment: Generalized weakness    Lower Extremity Assessment Lower Extremity Assessment: Generalized weakness (3-/5 knee extension BLE in sitting, 1/5 hip flexion BLE in sitting)       Communication   Communication: No difficulties  Cognition Arousal/Alertness: Awake/alert Behavior During Therapy: WFL for tasks assessed/performed Overall Cognitive Status: Within Functional Limits for tasks assessed                                 General Comments: Agreeable to tx  General Comments General comments (skin integrity, edema, etc.): Observed liquid on gown on R side of abdomen but no visbile drainage from abdomen - notified nursing staff    Exercises General Exercises - Lower Extremity Long Arc Quad: AROM;Strengthening;Both;10 reps;Seated Hip Flexion/Marching: AROM;Strengthening;Both;5 reps;Seated   Assessment/Plan    PT Assessment Patient needs continued PT services  PT Problem List Decreased strength;Decreased mobility;Decreased safety awareness;Decreased activity tolerance;Decreased balance;Decreased knowledge of use of DME       PT Treatment Interventions DME instruction;Therapeutic activities;Modalities;Gait training;Therapeutic exercise;Patient/family education;Stair  training;Balance training;Functional mobility training;Neuromuscular re-education;Manual techniques;Wheelchair mobility training    PT Goals (Current goals can be found in the Care Plan section)  Acute Rehab PT Goals Patient Stated Goal: get better PT Goal Formulation: With patient Time For Goal Achievement: 07/05/21 Potential to Achieve Goals: Fair    Frequency 7X/week   Barriers to discharge Decreased caregiver support;Inaccessible home environment lives alone, stairs to enter home    Co-evaluation               AM-PAC PT "6 Clicks" Mobility  Outcome Measure Help needed turning from your back to your side while in a flat bed without using bedrails?: A Little Help needed moving from lying on your back to sitting on the side of a flat bed without using bedrails?: A Little Help needed moving to and from a bed to a chair (including a wheelchair)?: A Lot Help needed standing up from a chair using your arms (e.g., wheelchair or bedside chair)?: Total Help needed to walk in hospital room?: Total Help needed climbing 3-5 steps with a railing? : Total 6 Click Score: 11    End of Session Equipment Utilized During Treatment: Gait belt (positioned above abdominal drains) Activity Tolerance: Patient tolerated treatment well Patient left: in chair;with nursing/sitter in room;with call bell/phone within reach Nurse Communication: Mobility status PT Visit Diagnosis: Unsteadiness on feet (R26.81);Muscle weakness (generalized) (M62.81);Difficulty in walking, not elsewhere classified (R26.2)    Time: [id number] PT Time Calculation (min) (ACUTE ONLY): 20 min   Charges:   PT Evaluation $PT Eval Moderate Complexity: 1 Mod PT Treatments $Therapeutic Activity: 8-22 mins        Lavone Nian, PT, DPT 06/21/21, 1:07 PM   [name] 06/21/2021, 1:00 PM

## 2021-06-22 DIAGNOSIS — F1020 Alcohol dependence, uncomplicated: Secondary | ICD-10-CM

## 2021-06-22 LAB — BASIC METABOLIC PANEL
Anion gap: 6 (ref 5–15)
BUN: 7 mg/dL (ref 6–20)
CO2: 26 mmol/L (ref 22–32)
Calcium: 7.2 mg/dL — ABNORMAL LOW (ref 8.9–10.3)
Chloride: 103 mmol/L (ref 98–111)
Creatinine, Ser: 0.62 mg/dL (ref 0.61–1.24)
GFR, Estimated: >60 mL/min (ref >60)
Glucose, Bld: 131 mg/dL — ABNORMAL HIGH (ref 70–99)
Potassium: 2.9 mmol/L — ABNORMAL LOW (ref 3.5–5.1)
Sodium: 135 mmol/L (ref 135–145)

## 2021-06-22 LAB — CBC WITH DIFFERENTIAL/PLATELET
Abs Immature Granulocytes: 0.4 10*3/uL — ABNORMAL HIGH (ref 0.00–0.07)
Basophils Absolute: 0.1 10*3/uL (ref 0.0–0.1)
Basophils Relative: 0 %
Eosinophils Absolute: 0.2 10*3/uL (ref 0.0–0.5)
Eosinophils Relative: 1 %
HCT: 29.8 % — ABNORMAL LOW (ref 39.0–52.0)
Hemoglobin: 10.5 g/dL — ABNORMAL LOW (ref 13.0–17.0)
Immature Granulocytes: 3 %
Lymphocytes Relative: 21 %
Lymphs Abs: 2.7 10*3/uL (ref 0.7–4.0)
MCH: 33.9 pg (ref 26.0–34.0)
MCHC: 35.2 g/dL (ref 30.0–36.0)
MCV: 96.1 fL (ref 80.0–100.0)
Monocytes Absolute: 0.7 10*3/uL (ref 0.1–1.0)
Monocytes Relative: 6 %
Neutro Abs: 8.8 10*3/uL — ABNORMAL HIGH (ref 1.7–7.7)
Neutrophils Relative %: 69 %
Platelets: 330 10*3/uL (ref 150–400)
RBC: 3.1 MIL/uL — ABNORMAL LOW (ref 4.22–5.81)
RDW: 16.1 % — ABNORMAL HIGH (ref 11.5–15.5)
WBC: 12.9 10*3/uL — ABNORMAL HIGH (ref 4.0–10.5)
nRBC: 0 % (ref 0.0–0.2)

## 2021-06-22 LAB — GLUCOSE, CAPILLARY
Glucose-Capillary: 49 mg/dL — ABNORMAL LOW (ref 70–99)
Glucose-Capillary: 99 mg/dL (ref 70–99)
Glucose-Capillary: 86 mg/dL (ref 70–99)
Glucose-Capillary: 104 mg/dL — ABNORMAL HIGH (ref 70–99)
Glucose-Capillary: 131 mg/dL — ABNORMAL HIGH (ref 70–99)
Glucose-Capillary: 138 mg/dL — ABNORMAL HIGH (ref 70–99)

## 2021-06-22 LAB — MAGNESIUM: Magnesium: 1.6 mg/dL — ABNORMAL LOW (ref 1.7–2.4)

## 2021-06-22 LAB — PHOSPHORUS: Phosphorus: 2.1 mg/dL — ABNORMAL LOW (ref 2.5–4.6)

## 2021-06-22 MED ORDER — FUROSEMIDE 40 MG PO TABS
40.0000 mg | ORAL_TABLET | Freq: Every day | ORAL | Status: DC
  Administered 2021-06-22 – 2021-06-24 (×3): 40 mg via ORAL
  Filled 2021-06-22 (×3): qty 1

## 2021-06-22 MED ORDER — MAGNESIUM SULFATE 2 GM/50ML IV SOLN
2.0000 g | Freq: Once | INTRAVENOUS | Status: AC
  Administered 2021-06-22: 2 g via INTRAVENOUS
  Filled 2021-06-22: qty 50

## 2021-06-22 MED ORDER — POTASSIUM CHLORIDE 20 MEQ PO PACK: 40.0000 meq | PACK | Freq: Once | ORAL | Status: DC

## 2021-06-22 MED ORDER — POTASSIUM CHLORIDE 10 MEQ/100ML IV SOLN
10.0000 meq | INTRAVENOUS | Status: AC
  Administered 2021-06-22 (×5): 10 meq via INTRAVENOUS
  Filled 2021-06-22 (×5): qty 100

## 2021-06-22 MED ORDER — MAGNESIUM SULFATE 50 % IJ SOLN: 3.0000 g | Freq: Once | INTRAVENOUS | Status: DC

## 2021-06-22 MED ORDER — INSULIN GLARGINE-YFGN 100 UNIT/ML ~~LOC~~ SOLN
15.0000 [IU] | Freq: Two times a day (BID) | SUBCUTANEOUS | Status: DC
  Administered 2021-06-22 (×2): 15 [IU] via SUBCUTANEOUS
  Filled 2021-06-22 (×4): qty 0.15

## 2021-06-22 MED ORDER — POTASSIUM CHLORIDE 20 MEQ PO PACK
40.0000 meq | PACK | Freq: Once | ORAL | Status: AC
  Administered 2021-06-22: 40 meq via ORAL
  Filled 2021-06-22: qty 2

## 2021-06-22 MED ORDER — MAGNESIUM SULFATE IN D5W 1-5 GM/100ML-% IV SOLN
1.0000 g | Freq: Once | INTRAVENOUS | Status: AC
  Administered 2021-06-22: 1 g via INTRAVENOUS
  Filled 2021-06-22: qty 100

## 2021-06-22 MED ORDER — CHLORHEXIDINE GLUCONATE CLOTH 2 % EX PADS
6.0000 | MEDICATED_PAD | Freq: Every day | CUTANEOUS | Status: DC
  Administered 2021-06-22 – 2021-06-25 (×4): 6 via TOPICAL

## 2021-06-22 MED ORDER — INSULIN ASPART 100 UNIT/ML IJ SOLN
4.0000 [IU] | Freq: Three times a day (TID) | INTRAMUSCULAR | Status: DC
  Administered 2021-06-22 (×2): 4 [IU] via SUBCUTANEOUS
  Filled 2021-06-22 (×2): qty 1

## 2021-06-22 NOTE — Progress Notes (Signed)
PROGRESS NOTE    Russell Gomez  [id number] DOB: 1961/10/06 DOA: 06/14/2021 PCP: Ria Bush, MD   Chief complaint.  Abdominal pain Brief Narrative:  60 yo male who presented to [facility] ER on 08/7 with a 3 day hx of lower abdominal pain.  He does have a hx of chronic ETOH abuse, however reported his last drink was 08/4.  CT Abd Pelvis revealed diverticular disease with perforation.  Therefore, General Surgery consulted and pt underwent emergent sigmoid colon resection with colostomy (Hartmann's procedure) and appendectomy.  He was subsequently admitted to the stepdown unit postop for additional workup and treatment.  However, on 08/9 hospital course complicated by severe ETOH withdrawal symptoms despite administration of ativan per CIWA protocol.  Therefore, precedex gtt ordered and PCCM team consulted to assist with plan of care. Patient was transferred to medical floor on 8/11.   Assessment & Plan:   Principal Problem:   Bowel perforation (HCC) Active Problems:   Essential hypertension   Hypocalcemia   Hypomagnesemia   Alcohol use disorder, moderate, dependence (HCC)   Type 1 diabetes mellitus with hyperglycemia (HCC)   AF (paroxysmal atrial fibrillation) (HCC)   Terminal ileitis (HCC)   Hypotension    #1.  Paroxysmal atrial fibrillation with rapid major response. Hypotension secondary to tachycardia. Condition has resolved, continue diltiazem and amiodarone. Continue Eliquis.  2.  Alcohol withdrawal with delirium tremens. Aspiration pneumonia secondary to alcohol withdrawal. Continue Augmentin for aspiration pneumonia for additional 2 days.  3.  Diverticulosis with perforation. Status post a Hartmann procedure.  Bowel function has been improved, continue antibiotics with Augmentin.  Length of treatment will be decided by general surgery.  4.  Type 1 diabetes uncontrolled with hyperglycemia. Reduce insulin dose due to hypoglycemia.  #5.   Hypokalemia Hypomagnesemia Hypophosphatemia. Replete potassium and magnesium.  #6 generalized weakness. PT/OT, pending CIR transfer.   DVT prophylaxis: Lovenox Code Status: full Family Communication: Wife updated at bedside. Disposition Plan:    Status is: Inpatient  Remains inpatient appropriate because:Inpatient level of care appropriate due to severity of illness  Dispo: The patient is from: Home              Anticipated d/c is to: SNF              Patient currently is not medically stable to d/c.   Difficult to place patient No        I/O last 3 completed shifts: In: 1361.7 [P.O.:1260; I.V.:20; IV Piggyback:81.7] Out: [id number] [Urine:450; Drains:575; Stool:2610] No intake/output data recorded.     Consultants:  Surgery  Procedures:   Antimicrobials: Augmentin  Subjective: Patient doing well today, has a large amount stools in ostomy.  No abdominal pain or nausea vomiting.  Tolerating diet No short of breath or cough.  Next no chest pain palpitation  No fever or chills. No dysuria hematuria.  Objective: Vitals:   06/21/21 2049 06/22/21 0443 06/22/21 0530 06/22/21 0814  BP: (!) 114/52   (!) 152/77  Pulse: 67   80  Resp: 18   17  Temp: 98 F (36.7 C)   97.8 F (36.6 C)  TempSrc:    Oral  SpO2: 98%   98%  Weight:  108.6 kg 111.4 kg   Height:        Intake/Output Summary (Last 24 hours) at 06/22/2021 1045 Last data filed at 06/22/2021 1017 Gross per 24 hour  Intake 805.88 ml  Output 2435 ml  Net -1629.12 ml   [employer]  06/20/21 1400 06/22/21 0443 06/22/21 0530  Weight: 113.5 kg 108.6 kg 111.4 kg    Examination:  General exam: Appears calm and comfortable  Respiratory system: Clear to auscultation. Respiratory effort normal. Cardiovascular system: S1 & S2 heard, RRR. No JVD, murmurs, rubs, gallops or clicks. No pedal edema. Gastrointestinal system: Abdomen is nondistended, soft and nontender. No organomegaly or masses felt. Normal bowel  sounds heard. Central nervous system: Alert and oriented. No focal neurological deficits. Extremities: Symmetric 5 x 5 power. Skin: No rashes, lesions or ulcers Psychiatry: Judgement and insight appear normal. Mood & affect appropriate.     Data Reviewed: I have personally reviewed following labs and imaging studies  CBC: Recent Labs  Lab 06/17/21 0447 06/18/21 0447 06/19/21 0853 06/21/21 0530 06/22/21 0455  WBC 7.7 9.4 18.2* 13.9* 12.9*  NEUTROABS  --  5.3  --  9.3* 8.8*  HGB 9.6* 10.6* 9.7* 9.7* 10.5*  HCT 28.7* 30.8* 27.9* 27.5* 29.8*  MCV 99.7 99.4 100.7* 95.8 96.1  PLT 173 175 249 282 [id number]   Basic Metabolic Panel: Recent Labs  Lab 06/17/21 0447 06/17/21 1900 06/18/21 0447 06/19/21 0530 06/19/21 0853 06/20/21 0529 06/21/21 0530 06/22/21 0455  NA 138  --  141  --  137  --  138 135  K 3.6  --  3.7  --  3.9  --  3.0* 2.9*  CL 104  --  110  --  104  --  104 103  CO2 30  --  28  --  20*  --  26 26  GLUCOSE 206*  --  162*  --  261*  --  209* 131*  BUN 12  --  9  --  12  --  8 7  CREATININE 0.62  --  0.60*  --  0.88  --  0.64 0.62  CALCIUM 7.3*  --  7.1*  --  7.2*  --  7.0* 7.2*  MG 1.5*   < > 1.5* 1.8  --  1.3* 1.6* 1.6*  PHOS 3.5  --  2.6 2.2*  --  2.5 3.0 2.1*   < > = values in this interval not displayed.   GFR: Estimated Creatinine Clearance: 133.7 mL/min (by C-G formula based on SCr of 0.62 mg/dL). Liver Function Tests: Recent Labs  Lab 06/16/21 0431 06/17/21 0447  AST 43* 58*  ALT 19 22  ALKPHOS 178* 223*  BILITOT 1.3* 0.9  PROT 5.1* 4.4*  ALBUMIN 1.6* 1.4*   No results for input(s): LIPASE, AMYLASE in the last 168 hours. No results for input(s): AMMONIA in the last 168 hours. Coagulation Profile: No results for input(s): INR, PROTIME in the last 168 hours. Cardiac Enzymes: No results for input(s): CKTOTAL, CKMB, CKMBINDEX, TROPONINI in the last 168 hours. BNP (last 3 results) No results for input(s): PROBNP in the last 8760 hours. HbA1C: No  results for input(s): HGBA1C in the last 72 hours. CBG: Recent Labs  Lab 06/21/21 2051 06/21/21 2356 06/22/21 0351 06/22/21 0421 06/22/21 0815  GLUCAP 326* 177* 49* 99 86   Lipid Profile: No results for input(s): CHOL, HDL, LDLCALC, TRIG, CHOLHDL, LDLDIRECT in the last 72 hours. Thyroid Function Tests: No results for input(s): TSH, T4TOTAL, FREET4, T3FREE, THYROIDAB in the last 72 hours. Anemia Panel: No results for input(s): VITAMINB12, FOLATE, FERRITIN, TIBC, IRON, RETICCTPCT in the last 72 hours. Sepsis Labs: No results for input(s): PROCALCITON, LATICACIDVEN in the last 168 hours.  Recent Results (from the past 240 hour(s))  Resp Panel by  RT-PCR (Flu A&B, Covid) Nasopharyngeal Swab     Status: None   Collection Time: 06/14/21  3:48 AM   Specimen: Nasopharyngeal Swab; Nasopharyngeal(NP) swabs in vial transport medium  Result Value Ref Range Status   SARS Coronavirus 2 by RT PCR NEGATIVE NEGATIVE Final    Comment: (NOTE) SARS-CoV-2 target nucleic acids are NOT DETECTED.  The SARS-CoV-2 RNA is generally detectable in upper respiratory specimens during the acute phase of infection. The lowest concentration of SARS-CoV-2 viral copies this assay can detect is 138 copies/mL. A negative result does not preclude SARS-Cov-2 infection and should not be used as the sole basis for treatment or other patient management decisions. A negative result may occur with  improper specimen collection/handling, submission of specimen other than nasopharyngeal swab, presence of viral mutation(s) within the areas targeted by this assay, and inadequate number of viral copies(<138 copies/mL). A negative result must be combined with clinical observations, patient history, and epidemiological information. The expected result is Negative.  Fact Sheet for Patients:  EntrepreneurPulse.com.au  Fact Sheet for Healthcare Providers:  IncredibleEmployment.be  This  test is no t yet approved or cleared by the [place] FDA and  has been authorized for detection and/or diagnosis of SARS-CoV-2 by FDA under an Emergency Use Authorization (EUA). This EUA will remain  in effect (meaning this test can be used) for the duration of the COVID-19 declaration under Section 564(b)(1) of the Act, 21 U.S.C.section 360bbb-3(b)(1), unless the authorization is terminated  or revoked sooner.       Influenza A by PCR NEGATIVE NEGATIVE Final   Influenza B by PCR NEGATIVE NEGATIVE Final    Comment: (NOTE) The Xpert Xpress SARS-CoV-2/FLU/RSV plus assay is intended as an aid in the diagnosis of influenza from Nasopharyngeal swab specimens and should not be used as a sole basis for treatment. Nasal washings and aspirates are unacceptable for Xpert Xpress SARS-CoV-2/FLU/RSV testing.  Fact Sheet for Patients: EntrepreneurPulse.com.au  Fact Sheet for Healthcare Providers: IncredibleEmployment.be  This test is not yet approved or cleared by the [place] FDA and has been authorized for detection and/or diagnosis of SARS-CoV-2 by FDA under an Emergency Use Authorization (EUA). This EUA will remain in effect (meaning this test can be used) for the duration of the COVID-19 declaration under Section 564(b)(1) of the Act, 21 U.S.C. section 360bbb-3(b)(1), unless the authorization is terminated or revoked.  Performed at [facility], [address] [place], [place] [address]   MRSA Next Gen by PCR, Nasal     Status: None   Collection Time: 06/14/21 10:10 AM   Specimen: Nasal Mucosa; Nasal Swab  Result Value Ref Range Status   MRSA by PCR Next Gen NOT DETECTED NOT DETECTED Final    Comment: (NOTE) The GeneXpert MRSA Assay (FDA approved for NASAL specimens only), is one component of a comprehensive MRSA colonization surveillance program. It is not intended to diagnose MRSA infection nor to guide or monitor treatment  for MRSA infections. Test performance is not FDA approved in patients less than 50 years old. Performed at [facility], [address] [place], [place] [address]   CULTURE, BLOOD (ROUTINE X 2) w Reflex to ID Panel     Status: None   Collection Time: 06/15/21 11:46 AM   Specimen: BLOOD  Result Value Ref Range Status   Specimen Description BLOOD BLOOD LEFT ARM  Final   Special Requests   Final    BOTTLES DRAWN AEROBIC AND ANAEROBIC Blood Culture adequate volume  Culture   Final    NO GROWTH 5 DAYS Performed at [facility], [address] [place], [place] [address]    Report Status 06/20/2021 FINAL  Final  CULTURE, BLOOD (ROUTINE X 2) w Reflex to ID Panel     Status: None   Collection Time: 06/15/21 11:46 AM   Specimen: BLOOD  Result Value Ref Range Status   Specimen Description BLOOD BLOOD RIGHT ARM  Final   Special Requests   Final    BOTTLES DRAWN AEROBIC AND ANAEROBIC Blood Culture results may not be optimal due to an inadequate volume of blood received in culture bottles   Culture   Final    NO GROWTH 5 DAYS Performed at [facility], [address], [place],  [address]    Report Status 06/20/2021 FINAL  Final         Radiology Studies: No results found.      Scheduled Meds:  amiodarone  400 mg Oral BID   amoxicillin-clavulanate  1 tablet Oral Q12H   apixaban  5 mg Oral BID   Chlorhexidine Gluconate Cloth  6 each Topical Daily   diazepam  2.5 mg Oral Q6H   diltiazem  180 mg Oral Daily   feeding supplement (GLUCERNA SHAKE)  237 mL Oral BID BM   feeding supplement (NEPRO CARB STEADY)  237 mL Oral TID BM   folic acid  1 mg Oral Daily   insulin aspart  0-6 Units Subcutaneous Q4H   insulin aspart  4 Units Subcutaneous TID WC   insulin glargine-yfgn  15 Units Subcutaneous BID   midodrine  10 mg Oral TID WC   multivitamin with minerals  1 tablet Oral Daily   nicotine  21 mg Transdermal Daily   pantoprazole (PROTONIX) IV   40 mg Intravenous QHS   polyethylene glycol  17 g Oral Daily   sodium chloride flush  10-40 mL Intracatheter Q12H   Continuous Infusions:  magnesium sulfate bolus IVPB 1 g (06/22/21 1018)   potassium chloride 10 mEq (06/22/21 1010)     LOS: 8 days    Time spent: 32 minutes, more than 50% time involved in direct patient care.    Sharen Hones, MD [facility]   To contact the attending provider between 7A-7P or the covering provider during after hours 7P-7A, please log into the web site [email or web address] and access using universal [place] password for that web site. If you do not have the password, please call the hospital operator.  06/22/2021, 10:45 AM

## 2021-06-22 NOTE — Progress Notes (Signed)
Physical Therapy Treatment Patient Details Name: Russell Gomez MRN: Russell Gomez DOB: Russell Gomez Today's Date: 06/22/2021    History of Present Illness Pt is a 60 y/o M who [facility] to [facility] on 06/14/21 with a 3 day hx of lower abdominal pain. CT revealed diverticular disease with perforation. Pt underwent emergent sigmoid colon resection with colostomy & appendectomy. On 06/16/21 his hospital course was comlicated by severe ETOH withdrawal symptoms. Pt is currently being treated for paroxysmal a-fib with rapid major response. PMH: chronic EtOH abuse, uncontrolled DM1, high cholesterol, HTN    PT Comments    Nurse reports pt almost finished receiving IV K+ to supplement 2/2 low K+ this morning (2.9). Pt seen for PT tx with pt eager to participate. Pt does not require physical assistance for bed mobility on this date but heavily relies on bed features. Pt is able to complete sit>stand from slightly elevated EOB with mod<>max assist. Pt progressed to sit<>stand exercises, static standing for BLE, and seated exercises for strengthening. Pt is able to progress gait, ambulating to door & back with RW & min assist + w/c follow for safety. Continue to recommend CIR upon d/c as pt is motivated to participate & would benefit from intensive therapies to return to PLOF.   Follow Up Recommendations  CIR     Equipment Recommendations  None recommended by PT (TBD in next venue)    Recommendations for Other Services       Precautions / Restrictions Precautions Precautions: Fall Precaution Comments: ostomy bag, JP drain Restrictions Weight Bearing Restrictions: No    Mobility  Bed Mobility Overal bed mobility: Needs Assistance Bed Mobility: Supine to Sit     Supine to sit: Supervision;HOB elevated     General bed mobility comments: extra time, reliance on bed rails    Transfers Overall transfer level: Needs assistance Equipment used: Rolling walker (2 wheeled) Transfers: Sit to/from  Stand Sit to Stand: Mod assist (from slightly elevated EOB)            Ambulation/Gait Ambulation/Gait assistance: Min assist;+2 safety/equipment Gait Distance (Feet): 10 Feet Assistive device: Rolling walker (2 wheeled) Gait Pattern/deviations: Decreased step length - right;Decreased step length - left;Decreased stride length;Decreased dorsiflexion - right;Shuffle Gait velocity: decreased   General Gait Details: cuing to ambulate within base of AD   Stairs             Wheelchair Mobility    Modified Rankin (Stroke Patients Only)       Balance Overall balance assessment: Needs assistance Sitting-balance support: Feet supported;Bilateral upper extremity supported Sitting balance-Leahy Scale: Fair     Standing balance support: During functional activity;Single extremity supported Standing balance-Leahy Scale: Poor Standing balance comment: Pt able to maintain static standing with as little as 1UE support on RW                            Cognition Arousal/Alertness: Awake/alert Behavior During Therapy: WFL for tasks assessed/performed Overall Cognitive Status: Within Functional Limits for tasks assessed                                 General Comments: eager to participate      Exercises General Exercises - Lower Extremity Ankle Circles/Pumps: AROM;Both;10 reps;Supine (for edema management) Hip Flexion/Marching: AROM;Strengthening;Both;5 reps;Seated Other Exercises Other Exercises: Pt tolerated static standing ~1 minute, able to progress marching in place. Pt  performed 5x sit<>stand from EOB with mod assist increasing to max assist.    General Comments        Pertinent Vitals/Pain Pain Assessment: No/denies pain    Home Living                      Prior Function            PT Goals (current goals can now be found in the care plan section) Acute Rehab PT Goals Patient Stated Goal: get better PT Goal Formulation:  With patient Time For Goal Achievement: 07/05/21 Potential to Achieve Goals: Good Progress towards PT goals: Progressing toward goals    Frequency    7X/week      PT Plan Current plan remains appropriate    Co-evaluation              AM-PAC PT "6 Clicks" Mobility   Outcome Measure  Help needed turning from your back to your side while in a flat bed without using bedrails?: A Little Help needed moving from lying on your back to sitting on the side of a flat bed without using bedrails?: A Little Help needed moving to and from a bed to a chair (including a wheelchair)?: A Little Help needed standing up from a chair using your arms (e.g., wheelchair or bedside chair)?: A Lot Help needed to walk in hospital room?: A Lot Help needed climbing 3-5 steps with a railing? : Total 6 Click Score: 14    End of Session Equipment Utilized During Treatment: Gait belt (positioned about drains on abdomen) Activity Tolerance: Patient tolerated treatment well Patient left: in chair;with call bell/phone within reach;with chair alarm set Nurse Communication:  (weeping on RUE) PT Visit Diagnosis: Unsteadiness on feet (R26.81);Muscle weakness (generalized) (M62.81);Difficulty in walking, not elsewhere classified (R26.2)     Time: [id number] PT Time Calculation (min) (ACUTE ONLY): 31 min  Charges:  $Therapeutic Exercise: 8-22 mins $Therapeutic Activity: 8-22 mins                     Russell Gomez, PT, DPT 06/22/21, 3:57 PM    Russell Gomez 06/22/2021, 3:55 PM

## 2021-06-22 NOTE — Progress Notes (Signed)
Inpatient Rehab Admissions Coordinator:  Pt rescreened.  Pt now appears to be an appropriate candidate for possible CIR admission. Will place a consult order.   Gayland Curry, [place], [place] Admissions Coordinator [phone]

## 2021-06-22 NOTE — Progress Notes (Signed)
Inpatient Diabetes Program Recommendations  AACE/ADA: New Consensus Statement on Inpatient Glycemic Control (2015)  Target Ranges:  Prepandial:   less than 140 mg/dL      Peak postprandial:   less than 180 mg/dL (1-2 hours)      Critically ill patients:  140 - 180 mg/dL   Results for ADEIN, TENOLD (MRN [id number]) as of 06/22/2021 07:44  Ref. Range 06/21/2021 00:11 06/21/2021 08:15 06/21/2021 11:41 06/21/2021 15:53 06/21/2021 20:51  Glucose-Capillary Latest Ref Range: 70 - 99 mg/dL 140 (H) 248 (H)  2 units NOVOLOG  10 units Semglee '@0952'$   380 (H)  5 units NOVOLOG  286 (H)  3 units NOVOLOG  326 (H)  4 units NOVOLOG  20 units Semglee '@2110'$    Results for MYRICK, GRIFFO (MRN [id number]) as of 06/22/2021 07:44  Ref. Range 06/21/2021 23:56 06/22/2021 03:51 06/22/2021 04:21  Glucose-Capillary Latest Ref Range: 70 - 99 mg/dL 177 (H)  1 unit NOVOLOG  49 (L) 99   History: Type 1 Diabetes Needs Basal, Correction, and Meal Coverage ETOH Abuse   Home DM Meds: Levemir 15 units Daily                              Regular 5-9 units per SSI  Current Orders: Semglee 20 units BID      Novolog 0-6 units Q4 hours     Allowed PO diet Also getting Glucerna PO supps 237 ml BID between meals Has orders for Nepro PO supps as well   MD- Note Hypoglycemia this AM--only takes Levemir 15 units Daily at home.  Of note, pt received total of 30 units basal insulin yesterday.    Please consider:  1. Reduce Semglee to 15 units Daily (home dose)  2. Change/ Increase the Novolog SSi to the 0-9 unit scale TID AC + HS  3. Start Novolog Meal Coverage: Novolog 4 units TID with meals Hold if pt eats <50% of meal, Hold if pt NPO  4. Stop the Nepro PO supplements (Has orders for Glucerna PO supplements)     --Will follow patient during hospitalization--  Wyn Quaker RN, MSN, CDE Diabetes Coordinator Inpatient Glycemic Control Team Team Pager: [phone] (8a-5p)

## 2021-06-22 NOTE — Progress Notes (Signed)
Patient ID: MELVEN MITRANI, male   DOB: [birth year], 60 y.o.   MRN: [id number]     [place] [facility] Day(s): 8.   Interval History: Patient seen and examined, no acute events or new complaints overnight. Patient reports feeling better.  Denies any nausea or vomiting.  Denies any pain.  Vital signs in last 24 hours: [min-max] current  Temp:  [97.8 F (36.6 C)-98.4 F (36.9 C)] 98.4 F (36.9 C) (08/15 1512) Pulse Rate:  [67-100] 77 (08/15 1512) Resp:  [17-18] 17 (08/15 1512) BP: (114-152)/(52-88) 135/66 (08/15 1512) SpO2:  [97 %-98 %] 98 % (08/15 1512) Weight:  [108.6 kg-111.4 kg] 111.4 kg (08/15 0530)     Height: '6\' 2"'$  (188 cm) Weight: 111.4 kg BMI (Calculated): 31.52   Physical Exam:  Constitutional: alert, cooperative and no distress  Respiratory: breathing non-labored at rest  Cardiovascular: regular rate and sinus rhythm  Gastrointestinal: soft, non-tender, and non-distended.  Colostomy pink and patent  Labs:  CBC Latest Ref Rng & Units 06/22/2021 06/21/2021 06/19/2021  WBC 4.0 - 10.5 K/uL 12.9(H) 13.9(H) 18.2(H)  Hemoglobin 13.0 - 17.0 g/dL 10.5(L) 9.7(L) 9.7(L)  Hematocrit 39.0 - 52.0 % 29.8(L) 27.5(L) 27.9(L)  Platelets 150 - 400 K/uL 330 282 249   CMP Latest Ref Rng & Units 06/22/2021 06/21/2021 06/19/2021  Glucose 70 - 99 mg/dL 131(H) 209(H) 261(H)  BUN 6 - 20 mg/dL '7 8 12  '$ Creatinine 0.61 - 1.24 mg/dL 0.62 0.64 0.88  Sodium 135 - 145 mmol/L 135 138 137  Potassium 3.5 - 5.1 mmol/L 2.9(L) 3.0(L) 3.9  Chloride 98 - 111 mmol/L 103 104 104  CO2 22 - 32 mmol/L 26 26 20(L)  Calcium 8.9 - 10.3 mg/dL 7.2(L) 7.0(L) 7.2(L)  Total Protein 6.5 - 8.1 g/dL - - -  Total Bilirubin 0.3 - 1.2 mg/dL - - -  Alkaline Phos 38 - 126 U/L - - -  AST 15 - 41 U/L - - -  ALT 0 - 44 U/L - - -    Imaging studies: No new pertinent imaging studies   Assessment/Plan:  60 y.o. male with diverticulitis with perforation 8 Day Post-Op s/p Hartman's procedure, complicated by pertinent  comorbidities including type 1 diabetes, A. fib and on anticoagulation, chronic alcohol abuse.  Patient recovering slowly.  Tolerated full liquids.  This morning he ate some soft diet and tolerated this well.  Continue with high output colostomy.  Will advance diet to diabetic regular diet.  We will assess if with solid diet the colostomy output decreased and get more solid.  If not we will consider starting Imodium.  Continue current medical management as per primary team.  We will continue to follow closely.  Arnold Long, MD

## 2021-06-22 NOTE — Consult Note (Signed)
[place] Nurse ostomy follow up Patient receiving care in [facility] 242. Today is teaching day 1.  Patient has been to sick to participate in ostomy care before today. Stoma type/location: LUQ colostomy Stomal assessment/size: to be determined when pouch is changed tomorrow. Peristomal assessment: deferred, additional pouching supplies to be requested by Unit Secretary today Treatment options for stomal/peristomal skin: barrier ring Output: thin brown with particles and gas in existing pouch  Ostomy pouching: 2pc. 2 and 3/4 inch flat. Pouch, Lawson #[phone], skin barrier, Pearline Cables, barrier ring, Kellie Simmering [phone] Education provided: With instructions and assistance, patient was able to open and empty the existing pouch, clean the tail, and close the pouch. We discussed twice weekly changing the system, and frequent emptying of the pouch.  I let his primary RN know I was going to put in an order instructing staff to assist him with emptying, but to allow him to do as much as he could, as often as possible.   Enrolled patient in [place] Discharge program: Yes, previously.  Val Riles, RN, MSN, CWOCN, CNS-BC, pager [phone]

## 2021-06-22 NOTE — Progress Notes (Signed)
[facility]    SUBJECTIVE: Feels much better sitting up eating no shakes no shortness of breath denies palpitations   Vitals:   06/21/21 2049 06/22/21 0443 06/22/21 0530 06/22/21 0814  BP: (!) 114/52   (!) 152/77  Pulse: 67   80  Resp: 18   17  Temp: 98 F (36.7 C)   97.8 F (36.6 C)  TempSrc:    Oral  SpO2: 98%   98%  Weight:  108.6 kg 111.4 kg   Height:         Intake/Output Summary (Last 24 hours) at 06/22/2021 [id number] Last data filed at 06/22/2021 [id number] Gross per 24 hour  Intake 1285.88 ml  Output 2955 ml  Net -1669.12 ml      PHYSICAL EXAM  General: Well developed, well nourished, in no acute distress HEENT:  Normocephalic and atramatic Neck:  No JVD.  Lungs: Clear bilaterally to auscultation and percussion. Heart: Irregular irregular. Normal S1 and S2 without gallops or murmurs.  Abdomen: Bowel sounds are positive, abdomen soft and non-tender  Msk:  Back normal, normal gait. Normal strength and tone for age. Extremities: No clubbing, cyanosis or edema.   Neuro: Alert and oriented X 3. Psych:  Good affect, responds appropriately   LABS: Basic Metabolic Panel: Recent Labs    06/21/21 0530 06/22/21 0455  NA 138 135  K 3.0* 2.9*  CL 104 103  CO2 26 26  GLUCOSE 209* 131*  BUN 8 7  CREATININE 0.64 0.62  CALCIUM 7.0* 7.2*  MG 1.6* 1.6*  PHOS 3.0 2.1*   Liver Function Tests: No results for input(s): AST, ALT, ALKPHOS, BILITOT, PROT, ALBUMIN in the last 72 hours. No results for input(s): LIPASE, AMYLASE in the last 72 hours. CBC: Recent Labs    06/21/21 0530 06/22/21 0455  WBC 13.9* 12.9*  NEUTROABS 9.3* 8.8*  HGB 9.7* 10.5*  HCT 27.5* 29.8*  MCV 95.8 96.1  PLT 282 330   Cardiac Enzymes: No results for input(s): CKTOTAL, CKMB, CKMBINDEX, TROPONINI in the last 72 hours. BNP: Invalid input(s): POCBNP D-Dimer: No results for input(s): DDIMER in the last 72 hours. Hemoglobin A1C: No results for input(s): HGBA1C in the last 72 hours. Fasting  Lipid Panel: No results for input(s): CHOL, HDL, LDLCALC, TRIG, CHOLHDL, LDLDIRECT in the last 72 hours. Thyroid Function Tests: No results for input(s): TSH, T4TOTAL, T3FREE, THYROIDAB in the last 72 hours.  Invalid input(s): FREET3 Anemia Panel: No results for input(s): VITAMINB12, FOLATE, FERRITIN, TIBC, IRON, RETICCTPCT in the last 72 hours.  No results found.   Echo preserved overall left ventricular function of 60%  TELEMETRY: Atrial fibrillation rate of 80 nonspecific findings:  ASSESSMENT AND PLAN:  Principal Problem:   Bowel perforation ([place]) Active Problems:   Essential hypertension   Hypocalcemia   Hypomagnesemia   Alcohol use disorder, moderate, dependence (HCC)   Type 1 diabetes mellitus with hyperglycemia (HCC)   AF (paroxysmal atrial fibrillation) (HCC)   Terminal ileitis ([place])   Hypotension    Plan Continue Eliquis for anticoagulation for atrial fibrillation Maintain rate control for atrial fibrillation currently on amiodarone Continue amiodarone therapy will reduce from 400 twice a day to 200 twice a day Continue diabetes management and control Maintained on diltiazem 180 daily Continue withdrawal therapy and treatment Currently on antibiotic therapy postop from bowel perforation Advised patient refrain from alcohol abuse Maintain low-dose midodrine to help with blood pressure support   Yolonda Kida, MD 06/22/2021 8:35 AM

## 2021-06-23 LAB — IRON AND TIBC
Iron: 60 ug/dL (ref 45–182)
Saturation Ratios: 52 % — ABNORMAL HIGH (ref 17.9–39.5)
TIBC: 115 ug/dL — ABNORMAL LOW (ref 250–450)
UIBC: 55 ug/dL

## 2021-06-23 LAB — BASIC METABOLIC PANEL
Anion gap: 7 (ref 5–15)
BUN: 7 mg/dL (ref 6–20)
CO2: 25 mmol/L (ref 22–32)
Calcium: 7 mg/dL — ABNORMAL LOW (ref 8.9–10.3)
Chloride: 102 mmol/L (ref 98–111)
Creatinine, Ser: 0.71 mg/dL (ref 0.61–1.24)
GFR, Estimated: >60 mL/min (ref >60)
Glucose, Bld: 185 mg/dL — ABNORMAL HIGH (ref 70–99)
Potassium: 3.4 mmol/L — ABNORMAL LOW (ref 3.5–5.1)
Sodium: 134 mmol/L — ABNORMAL LOW (ref 135–145)

## 2021-06-23 LAB — GLUCOSE, CAPILLARY
Glucose-Capillary: 68 mg/dL — ABNORMAL LOW (ref 70–99)
Glucose-Capillary: 29 mg/dL — CL (ref 70–99)
Glucose-Capillary: 105 mg/dL — ABNORMAL HIGH (ref 70–99)
Glucose-Capillary: 183 mg/dL — ABNORMAL HIGH (ref 70–99)
Glucose-Capillary: 343 mg/dL — ABNORMAL HIGH (ref 70–99)
Glucose-Capillary: 318 mg/dL — ABNORMAL HIGH (ref 70–99)

## 2021-06-23 LAB — VITAMIN B12: Vitamin B-12: 738 pg/mL (ref 180–914)

## 2021-06-23 LAB — MAGNESIUM: Magnesium: 1.5 mg/dL — ABNORMAL LOW (ref 1.7–2.4)

## 2021-06-23 LAB — PHOSPHORUS: Phosphorus: 2.1 mg/dL — ABNORMAL LOW (ref 2.5–4.6)

## 2021-06-23 MED ORDER — POTASSIUM PHOSPHATES 15 MMOLE/5ML IV SOLN
30.0000 mmol | Freq: Once | INTRAVENOUS | Status: AC
  Administered 2021-06-23: 30 mmol via INTRAVENOUS
  Filled 2021-06-23: qty 10

## 2021-06-23 MED ORDER — INSULIN GLARGINE-YFGN 100 UNIT/ML ~~LOC~~ SOLN
15.0000 [IU] | Freq: Every day | SUBCUTANEOUS | Status: DC
  Administered 2021-06-23 – 2021-06-24 (×2): 15 [IU] via SUBCUTANEOUS
  Filled 2021-06-23 (×3): qty 0.15

## 2021-06-23 MED ORDER — INSULIN ASPART 100 UNIT/ML IJ SOLN: 0.0000 [IU] | Freq: Three times a day (TID) | INTRAMUSCULAR | Status: DC

## 2021-06-23 MED ORDER — MAGNESIUM SULFATE 4 GM/100ML IV SOLN
4.0000 g | Freq: Once | INTRAVENOUS | Status: AC
  Administered 2021-06-23: 4 g via INTRAVENOUS
  Filled 2021-06-23: qty 100

## 2021-06-23 MED ORDER — INSULIN ASPART 100 UNIT/ML IJ SOLN
0.0000 [IU] | Freq: Every day | INTRAMUSCULAR | Status: DC
  Administered 2021-06-23: 4 [IU] via SUBCUTANEOUS
  Administered 2021-06-24: 2 [IU] via SUBCUTANEOUS
  Filled 2021-06-23 (×2): qty 1

## 2021-06-23 MED ORDER — INSULIN ASPART 100 UNIT/ML IJ SOLN
0.0000 [IU] | Freq: Three times a day (TID) | INTRAMUSCULAR | Status: DC
  Administered 2021-06-23: 7 [IU] via SUBCUTANEOUS
  Administered 2021-06-24: 3 [IU] via SUBCUTANEOUS
  Administered 2021-06-24: 2 [IU] via SUBCUTANEOUS
  Administered 2021-06-25: 3 [IU] via SUBCUTANEOUS
  Administered 2021-06-25: 7 [IU] via SUBCUTANEOUS
  Filled 2021-06-23 (×5): qty 1

## 2021-06-23 NOTE — Progress Notes (Addendum)
PROGRESS NOTE    Russell Gomez  [id number] DOB: December 30, 1960 DOA: 06/14/2021 PCP: Ria Bush, MD   Chief complaint.  Abdominal pain. Brief Narrative:  59 yo male who presented to [facility] ER on 08/7 with a 3 day hx of lower abdominal pain.  He does have a hx of chronic ETOH abuse, however reported his last drink was 08/4.  CT Abd Pelvis revealed diverticular disease with perforation.  Therefore, General Surgery consulted and pt underwent emergent sigmoid colon resection with colostomy (Hartmann's procedure) and appendectomy.  He was subsequently admitted to the stepdown unit postop for additional workup and treatment.  However, on 08/9 hospital course complicated by severe ETOH withdrawal symptoms despite administration of ativan per CIWA protocol.  Therefore, precedex gtt ordered and PCCM team consulted to assist with plan of care. Patient was transferred to medical floor on 8/11.   Assessment & Plan:   Principal Problem:   Bowel perforation (HCC) Active Problems:   Essential hypertension   Hypocalcemia   Hypomagnesemia   Alcohol use disorder, moderate, dependence (HCC)   Type 1 diabetes mellitus with hyperglycemia (HCC)   AF (paroxysmal atrial fibrillation) (HCC)   Terminal ileitis (HCC)   Hypotension  Paroxysmal atrial fibrillation with rapid major response. Hypotension secondary to tachycardia. Patient develop paroxysmal atrial fibrillation postop.  Was placed on Eliquis, amiodarone drip and diltiazem. Currently, patient is still on oral amiodarone 400 mg twice a day, may reduce to 200 mg twice a day at time of discharge.  Patient has back to sinus rhythm.  Alcohol withdrawal with delirium tremens. Aspiration pneumonia secondary to alcohol withdrawal. Patient initially treated with Zosyn, currently on Augmentin.   Diverticulosis with perforation. Status post a Hartmann procedure.  Patient doing well, bowel has recovered.  Still on Augmentin.  Will need discussed with  surgery about discontinuation of antibiotics after 2 weeks.  Type 1 diabetes uncontrolled with hyperglycemia and hypoglycemia. Initially patient glucose was running high, currently patient developed hypoglycemia.  Further reduce insulin dose.    Hypokalemia Hypomagnesemia Hypophosphatemia Continue to replete magnesium, phosphorus and potassium  Generalized weakness. Patient is pending for CIR transfer.  Anemia. Check iron, B12 level.  Severe hypoalbuminemia. Continue protein supplements.   DVT prophylaxis: Loveox Code Status: full Family Communication:  Disposition Plan:    Status is: Inpatient  Remains inpatient appropriate because:IV treatments appropriate due to intensity of illness or inability to take PO and Inpatient level of care appropriate due to severity of illness  Dispo: The patient is from: Home              Anticipated d/c is to: CIR              Patient currently is not medically stable to d/c.   Difficult to place patient No        I/O last 3 completed shifts: In: 625.9 [P.O.:600; I.V.:10; IV Piggyback:15.9] Out: 3300 [Urine:1050; Drains:740; Stool:1510] Total I/O In: 480 [P.O.:480] Out: 320 [Drains:120; Stool:200]     Consultants:  General surgery  Procedures: Bowel  Antimicrobials:  Augmentin  Subjective: Patient is doing well, he is tolerating soft diet, he has significant amount of stool output from ostomy bag.  No nausea vomiting, Denies any short of breath or cough. No dysuria hematuria No fever or chills. No chest pain palpitation.  Objective: Vitals:   06/23/21 0333 06/23/21 0401 06/23/21 0910 06/23/21 1119  BP: (!) 141/124  120/85 132/62  Pulse: 85  66 88  Resp: 18  18 18  Temp: 97.9 F (36.6 C)  98 F (36.7 C) 98 F (36.7 C)  TempSrc:      SpO2: 98%  99% 98%  Weight:  112.5 kg    Height:        Intake/Output Summary (Last 24 hours) at 06/23/2021 1237 Last data filed at 06/23/2021 1000 Gross per 24 hour  Intake  720 ml  Output 2055 ml  Net -1335 ml   Filed Weights   06/22/21 0443 06/22/21 0530 06/23/21 0401  Weight: 108.6 kg 111.4 kg 112.5 kg    Examination:  General exam: Appears calm and comfortable  Respiratory system: Clear to auscultation. Respiratory effort normal. Cardiovascular system: S1 & S2 heard, RRR. No JVD, murmurs, rubs, gallops or clicks.  Gastrointestinal system: Abdomen is nondistended, soft and nontender. No organomegaly or masses felt. Normal bowel sounds heard. Central nervous system: Alert and oriented. No focal neurological deficits. Extremities: 2-3+ leg edema Skin: No rashes, lesions or ulcers Psychiatry: Judgement and insight appear normal. Mood & affect appropriate.     Data Reviewed: I have personally reviewed following labs and imaging studies  CBC: Recent Labs  Lab 06/17/21 0447 06/18/21 0447 06/19/21 0853 06/21/21 0530 06/22/21 0455  WBC 7.7 9.4 18.2* 13.9* 12.9*  NEUTROABS  --  5.3  --  9.3* 8.8*  HGB 9.6* 10.6* 9.7* 9.7* 10.5*  HCT 28.7* 30.8* 27.9* 27.5* 29.8*  MCV 99.7 99.4 100.7* 95.8 96.1  PLT 173 175 249 282 [id number]   Basic Metabolic Panel: Recent Labs  Lab 06/18/21 0447 06/19/21 0530 06/19/21 0853 06/20/21 0529 06/21/21 0530 06/22/21 0455 06/23/21 0425  NA 141  --  137  --  138 135 134*  K 3.7  --  3.9  --  3.0* 2.9* 3.4*  CL 110  --  104  --  104 103 102  CO2 28  --  20*  --  '26 26 25  '$ GLUCOSE 162*  --  261*  --  209* 131* 185*  BUN 9  --  12  --  '8 7 7  '$ CREATININE 0.60*  --  0.88  --  0.64 0.62 0.71  CALCIUM 7.1*  --  7.2*  --  7.0* 7.2* 7.0*  MG 1.5* 1.8  --  1.3* 1.6* 1.6* 1.5*  PHOS 2.6 2.2*  --  2.5 3.0 2.1* 2.1*   GFR: Estimated Creatinine Clearance: 134.2 mL/min (by C-G formula based on SCr of 0.71 mg/dL). Liver Function Tests: Recent Labs  Lab 06/17/21 0447  AST 58*  ALT 22  ALKPHOS 223*  BILITOT 0.9  PROT 4.4*  ALBUMIN 1.4*   No results for input(s): LIPASE, AMYLASE in the last 168 hours. No results for  input(s): AMMONIA in the last 168 hours. Coagulation Profile: No results for input(s): INR, PROTIME in the last 168 hours. Cardiac Enzymes: No results for input(s): CKTOTAL, CKMB, CKMBINDEX, TROPONINI in the last 168 hours. BNP (last 3 results) No results for input(s): PROBNP in the last 8760 hours. HbA1C: No results for input(s): HGBA1C in the last 72 hours. CBG: Recent Labs  Lab 06/22/21 2020 06/23/21 0142 06/23/21 0350 06/23/21 0912 06/23/21 1121  GLUCAP 138* 68* 29* 105* 183*   Lipid Profile: No results for input(s): CHOL, HDL, LDLCALC, TRIG, CHOLHDL, LDLDIRECT in the last 72 hours. Thyroid Function Tests: No results for input(s): TSH, T4TOTAL, FREET4, T3FREE, THYROIDAB in the last 72 hours. Anemia Panel: No results for input(s): VITAMINB12, FOLATE, FERRITIN, TIBC, IRON, RETICCTPCT in the last 72 hours. Sepsis Labs: No  results for input(s): PROCALCITON, LATICACIDVEN in the last 168 hours.  Recent Results (from the past 240 hour(s))  Resp Panel by RT-PCR (Flu A&B, Covid) Nasopharyngeal Swab     Status: None   Collection Time: 06/14/21  3:48 AM   Specimen: Nasopharyngeal Swab; Nasopharyngeal(NP) swabs in vial transport medium  Result Value Ref Range Status   SARS Coronavirus 2 by RT PCR NEGATIVE NEGATIVE Final    Comment: (NOTE) SARS-CoV-2 target nucleic acids are NOT DETECTED.  The SARS-CoV-2 RNA is generally detectable in upper respiratory specimens during the acute phase of infection. The lowest concentration of SARS-CoV-2 viral copies this assay can detect is 138 copies/mL. A negative result does not preclude SARS-Cov-2 infection and should not be used as the sole basis for treatment or other patient management decisions. A negative result may occur with  improper specimen collection/handling, submission of specimen other than nasopharyngeal swab, presence of viral mutation(s) within the areas targeted by this assay, and inadequate number of viral copies(<138  copies/mL). A negative result must be combined with clinical observations, patient history, and epidemiological information. The expected result is Negative.  Fact Sheet for Patients:  EntrepreneurPulse.com.au  Fact Sheet for Healthcare Providers:  IncredibleEmployment.be  This test is no t yet approved or cleared by the [place] FDA and  has been authorized for detection and/or diagnosis of SARS-CoV-2 by FDA under an Emergency Use Authorization (EUA). This EUA will remain  in effect (meaning this test can be used) for the duration of the COVID-19 declaration under Section 564(b)(1) of the Act, 21 U.S.C.section 360bbb-3(b)(1), unless the authorization is terminated  or revoked sooner.       Influenza A by PCR NEGATIVE NEGATIVE Final   Influenza B by PCR NEGATIVE NEGATIVE Final    Comment: (NOTE) The Xpert Xpress SARS-CoV-2/FLU/RSV plus assay is intended as an aid in the diagnosis of influenza from Nasopharyngeal swab specimens and should not be used as a sole basis for treatment. Nasal washings and aspirates are unacceptable for Xpert Xpress SARS-CoV-2/FLU/RSV testing.  Fact Sheet for Patients: EntrepreneurPulse.com.au  Fact Sheet for Healthcare Providers: IncredibleEmployment.be  This test is not yet approved or cleared by the [place] FDA and has been authorized for detection and/or diagnosis of SARS-CoV-2 by FDA under an Emergency Use Authorization (EUA). This EUA will remain in effect (meaning this test can be used) for the duration of the COVID-19 declaration under Section 564(b)(1) of the Act, 21 U.S.C. section 360bbb-3(b)(1), unless the authorization is terminated or revoked.  Performed at [facility], [address] [place], [place] [address]   MRSA Next Gen by PCR, Nasal     Status: None   Collection Time: 06/14/21 10:10 AM   Specimen: Nasal Mucosa; Nasal Swab  Result  Value Ref Range Status   MRSA by PCR Next Gen NOT DETECTED NOT DETECTED Final    Comment: (NOTE) The GeneXpert MRSA Assay (FDA approved for NASAL specimens only), is one component of a comprehensive MRSA colonization surveillance program. It is not intended to diagnose MRSA infection nor to guide or monitor treatment for MRSA infections. Test performance is not FDA approved in patients less than 49 years old. Performed at [facility], [address] [place], [place] [address]   CULTURE, BLOOD (ROUTINE X 2) w Reflex to ID Panel     Status: None   Collection Time: 06/15/21 11:46 AM   Specimen: BLOOD  Result Value Ref Range Status   Specimen Description BLOOD BLOOD LEFT ARM  Final   Special Requests   Final    BOTTLES DRAWN AEROBIC AND ANAEROBIC Blood Culture adequate volume   Culture   Final    NO GROWTH 5 DAYS Performed at [facility], [address] [place], [place] [address]    Report Status 06/20/2021 FINAL  Final  CULTURE, BLOOD (ROUTINE X 2) w Reflex to ID Panel     Status: None   Collection Time: 06/15/21 11:46 AM   Specimen: BLOOD  Result Value Ref Range Status   Specimen Description BLOOD BLOOD RIGHT ARM  Final   Special Requests   Final    BOTTLES DRAWN AEROBIC AND ANAEROBIC Blood Culture results may not be optimal due to an inadequate volume of blood received in culture bottles   Culture   Final    NO GROWTH 5 DAYS Performed at [facility], [address] [place], [place] [address]    Report Status 06/20/2021 FINAL  Final         Radiology Studies: No results found.      Scheduled Meds:  amiodarone  400 mg Oral BID   amoxicillin-clavulanate  1 tablet Oral Q12H   apixaban  5 mg Oral BID   Chlorhexidine Gluconate Cloth  6 each Topical Daily   diazepam  2.5 mg Oral Q6H   diltiazem  180 mg Oral Daily   feeding supplement (GLUCERNA SHAKE)  237 mL Oral BID BM   folic acid  1 mg Oral Daily   furosemide  40 mg Oral Daily    insulin aspart  0-5 Units Subcutaneous QHS   insulin aspart  0-9 Units Subcutaneous TID WC   insulin glargine-yfgn  15 Units Subcutaneous QHS   midodrine  10 mg Oral TID WC   multivitamin with minerals  1 tablet Oral Daily   nicotine  21 mg Transdermal Daily   pantoprazole (PROTONIX) IV  40 mg Intravenous QHS   polyethylene glycol  17 g Oral Daily   sodium chloride flush  10-40 mL Intracatheter Q12H   Continuous Infusions:  potassium PHOSPHATE IVPB (in mmol) 30 mmol (06/23/21 1100)     LOS: 9 days    Time spent: 32 minutes    Sharen Hones, MD [facility]   To contact the attending provider between 7A-7P or the covering provider during after hours 7P-7A, please log into the web site [email or web address] and access using universal [employer] password for that web site. If you do not have the password, please call the hospital operator.  06/23/2021, 12:37 PM

## 2021-06-23 NOTE — Progress Notes (Signed)
Physical Therapy Treatment Patient Details Name: Russell Gomez MRN: [id number] DOB: 1961-10-30 Today's Date: 06/23/2021    History of Present Illness Pt is a 60 y/o M who presented to [facility] on ER on 06/14/21 with a 3 day hx of lower abdominal pain. CT revealed diverticular disease with perforation. Pt underwent emergent sigmoid colon resection with colostomy & appendectomy. On 06/16/21 his hospital course was comlicated by severe ETOH withdrawal symptoms. Pt is currently being treated for paroxysmal a-fib with rapid major response. PMH: chronic EtOH abuse, uncontrolled DM1, high cholesterol, HTN    PT Comments    Pt was long sitting in bed finishing breakfast upon arriving. He agrees to PT session and is extremely motivated and pleasant. Oriented and aware of current situation. Lengthy discussion about alcohol and inpatient rehab recommendation. He is agreeable and motivated for rehab. Endorses eagerness to quick drinking and get life in order. He was able to exit bed with assistance, stand to RW from elevated bed height, and ambulate with RW 6f. All task completed with assistance +1. See below for assistance required with ADLs. BP prior to activity 133/86. He endorses fatigue with activity however vital stable throughout. Pt is a great CIR candidate and will benefit from post acute PT/OT to assist him to PLOF. Acute PT will continue to follow and progress as able per current POC.   Follow Up Recommendations  CIR     Equipment Recommendations  Other (comment) (defer to next level of care)       Precautions / Restrictions Precautions Precautions: Fall Precaution Comments: ostomy bag, JP drain,abd incision Restrictions Weight Bearing Restrictions: No    Mobility  Bed Mobility Overal bed mobility: Needs Assistance Bed Mobility: Supine to Sit     Supine to sit: HOB elevated;Min assist     General bed mobility comments: Alot of extra time required with heavy use of bed rails + HOB  elevated. pt is extremely weak but very motivated. BP in long sitting 133/86    Transfers Overall transfer level: Needs assistance Equipment used: Rolling walker (2 wheeled) Transfers: Sit to/from Stand Sit to Stand: Min assist;Mod assist;From elevated surface (slightly elevated bed height)         General transfer comment: Pt demonstrated much improved abilities to STS however continues to rely on elevated bed height and +1 assistance to achieve standing  Ambulation/Gait Ambulation/Gait assistance: Min assist Gait Distance (Feet): 35 Feet Assistive device: Rolling walker (2 wheeled) Gait Pattern/deviations: Decreased step length - right;Decreased step length - left;Decreased stride length;Decreased dorsiflexion - right;Shuffle Gait velocity: decreased   General Gait Details: pt was able to increase gait distances to 35 ft with use of baraitric RW and vcs for improved gait kinematics/posture. Pt fatigues quickly however vitals stable throughout. no desaturation on rm air with HR stable throughout activity     Balance Overall balance assessment: Needs assistance Sitting-balance support: Feet supported;Bilateral upper extremity supported Sitting balance-Leahy Scale: Fair     Standing balance support: Bilateral upper extremity supported;During functional activity Standing balance-Leahy Scale: Poor Standing balance comment: pt is high fall risk due to overall weakness       Cognition Arousal/Alertness: Awake/alert Behavior During Therapy: WFL for tasks assessed/performed Overall Cognitive Status: Within Functional Limits for tasks assessed      General Comments: motivated and reports he is done with drinking and eager to get life back on track             Pertinent Vitals/Pain Pain Assessment: No/denies pain  PT Goals (current goals can now be found in the care plan section) Acute Rehab PT Goals Patient Stated Goal: get my strength back and be able to walk without  a walker Progress towards PT goals: Progressing toward goals    Frequency    7X/week      PT Plan Current plan remains appropriate       AM-PAC PT "6 Clicks" Mobility   Outcome Measure  Help needed turning from your back to your side while in a flat bed without using bedrails?: A Little Help needed moving from lying on your back to sitting on the side of a flat bed without using bedrails?: A Little Help needed moving to and from a bed to a chair (including a wheelchair)?: A Little Help needed standing up from a chair using your arms (e.g., wheelchair or bedside chair)?: A Lot Help needed to walk in hospital room?: A Lot Help needed climbing 3-5 steps with a railing? : A Lot 6 Click Score: 15    End of Session Equipment Utilized During Treatment: Gait belt Activity Tolerance: Patient tolerated treatment well Patient left: in chair;with call bell/phone within reach;with chair alarm set Nurse Communication: Mobility status PT Visit Diagnosis: Unsteadiness on feet (R26.81);Muscle weakness (generalized) (M62.81);Difficulty in walking, not elsewhere classified (R26.2)     Time: [id number] PT Time Calculation (min) (ACUTE ONLY): 25 min  Charges:  $Gait Training: 8-22 mins $Therapeutic Activity: 8-22 mins                     Julaine Fusi PTA 06/23/21, 9:04 AM

## 2021-06-23 NOTE — Consult Note (Signed)
[place] Nurse ostomy follow up Patient receiving care in [facility] 242. Stoma type/location: LUQ colostomy Stomal assessment/size: oval, 1 3/4 inches. Necrotic edge from 4 - 6 o'clock remains present. Sutures intact. Peristomal assessment: intact Treatment options for stomal/peristomal skin:  Output: thin brown Ostomy pouching: 2pc. Barrier ring, # [id number], wafer # 2, pouch # [id number] Education provided: With my guidance and assistance, patient emptied existing pouch, removed existing pouching system, and performed all steps of application of new pouching system.  He also told me he emptied his pouch several times yesterday. He had several appropriate questions that were answered to his expressed satisfaction. Enrolled patient in [place] Start Discharge program: Yes  If present in hospital Friday, I plan to return for another pouching change session.  He is to check with his wife and find out if the [employer] program box arrived to his home. Val Riles, RN, MSN, CWOCN, CNS-BC, pager [phone]

## 2021-06-23 NOTE — Progress Notes (Signed)
Occupational Therapy Treatment Patient Details Name: Russell Gomez MRN: [id number] DOB: 02-01-1961 Today's Date: 06/23/2021    History of present illness Pt is a 60 y/o M who presented to [facility] on ER on 06/14/21 with a 3 day hx of lower abdominal pain. CT revealed diverticular disease with perforation. Pt underwent emergent sigmoid colon resection with colostomy & appendectomy. On 06/16/21 his hospital course was comlicated by severe ETOH withdrawal symptoms. Pt is currently being treated for paroxysmal a-fib with rapid major response. PMH: chronic EtOH abuse, uncontrolled DM1, high cholesterol, HTN   OT comments  Upon entering the room, pt supine in bed with RN having just completed bladder scan. Pt is motivated for OT intervention and this is his second therapy session today. Pt performs bed mobility with min A for trunk support to EOB. Focus on stand<>controlled sit. Bed elevated and pt needing min cuing for hand placement with RW and min A to stand. Pt performs 5 reps and standing for ~ 1 minute each time. Pt needing mod A for the last stand secondary to fatigue but putting forth great effort. OT provided pt with red theraband for B UE strengthening exercises and pt demonstrated 2 sets of 10 chest pulls, shoulder diagonals, shoulder elevation, and alternating punches with rest breaks as needed. Theraband left from pt to continue exercise in room when he is able. Pt continues to benefit from acute OT intervention. Continued recommendation for intensive inpatient rehab program to address functional deficits.    Follow Up Recommendations  CIR    Equipment Recommendations  Other (comment) (defer to next venue of care)       Precautions / Restrictions Precautions Precautions: Fall Precaution Comments: ostomy bag, JP drain,abd incision       Mobility Bed Mobility Overal bed mobility: Needs Assistance Bed Mobility: Supine to Sit;Sit to Supine     Supine to sit: HOB elevated;Min assist Sit to  supine: HOB elevated;Min assist        Transfers Overall transfer level: Needs assistance Equipment used: Rolling walker (2 wheeled) Transfers: Sit to/from Stand Sit to Stand: Min assist;Mod assist;From elevated surface              Balance Overall balance assessment: Needs assistance Sitting-balance support: Feet supported;Bilateral upper extremity supported Sitting balance-Leahy Scale: Fair     Standing balance support: Bilateral upper extremity supported;During functional activity Standing balance-Leahy Scale: Poor Standing balance comment: pt is high fall risk due to overall weakness                           ADL either performed or assessed with clinical judgement     Cognition Arousal/Alertness: Awake/alert Behavior During Therapy: WFL for tasks assessed/performed Overall Cognitive Status: Within Functional Limits for tasks assessed                                 General Comments: Pt is motivated and agreeable to OT intervention.                   Pertinent Vitals/ Pain       Pain Assessment: No/denies pain         Frequency  Min 3X/week        Progress Toward Goals  OT Goals(current goals can now be found in the care plan section)  Progress towards OT goals: Progressing toward goals  Acute Rehab OT  Goals Patient Stated Goal: I want to go to rehab to get stronger OT Goal Formulation: With patient Time For Goal Achievement: 07/05/21 Potential to Achieve Goals: Good  Plan Discharge plan remains appropriate;Frequency remains appropriate       AM-PAC OT "6 Clicks" Daily Activity     Outcome Measure   Help from another person eating meals?: None Help from another person taking care of personal grooming?: A Little Help from another person toileting, which includes using toliet, bedpan, or urinal?: A Lot Help from another person bathing (including washing, rinsing, drying)?: A Lot Help from another person to put on  and taking off regular upper body clothing?: A Little Help from another person to put on and taking off regular lower body clothing?: A Lot 6 Click Score: 16    End of Session Equipment Utilized During Treatment: Rolling walker  OT Visit Diagnosis: Unsteadiness on feet (R26.81);Muscle weakness (generalized) (M62.81)   Activity Tolerance Patient limited by fatigue   Patient Left in bed;with bed alarm set;with call bell/phone within reach   Nurse Communication Mobility status        Time: [id number] OT Time Calculation (min): 26 min  Charges: OT General Charges $OT Visit: 1 Visit OT Treatments $Therapeutic Activity: 8-22 mins $Therapeutic Exercise: 8-22 mins  Darleen Crocker, MS, OTR/L , CBIS ascom [phone]  06/23/21, 3:53 PM

## 2021-06-23 NOTE — Progress Notes (Signed)
Inpatient Rehabilitation Admissions Coordinator   I have received insurance approval to admit patient to [facility] in [place] to inpatient acute rehab center/CIR. I will follow up with acute team and TOC tomorrow after our 0930 [place]uddle to clarify when a bed will be available to admit him to this week.  Danne Baxter, RN, MSN Rehab Admissions Coordinator [phone] 06/23/2021 4:43 PM

## 2021-06-23 NOTE — Progress Notes (Signed)
Nutrition Follow Up Note   DOCUMENTATION CODES:   Not applicable  INTERVENTION:   Glucerna Shake po TID, each supplement provides 220 kcal and 10 grams of protein  MVI, thiamine and folic acid po daily   Pt at high refeed risk; recommend monitor potassium, magnesium and phosphorus labs daily until stable  NUTRITION DIAGNOSIS:   Inadequate oral intake related to acute illness as evidenced by other (comment) (pt on clear liquid diet).  GOAL:   Patient will meet greater than or equal to 90% of their needs -progressing   MONITOR:   PO intake, Supplement acceptance, Diet advancement, Labs, Weight trends, Skin, I & O's  ASSESSMENT:   60 y/o male with h/o type 1 DM (diagnosed age 24y), HTN, etoh abuse, Afib, GERD and AAA who is admitted with severe sepsis secondary to perforated diverticulitis now s/p Hartmann's 8/7  Pt advanced to a carbohydrate modified diet 8/15. Pt with good appetite and oral intake in hospital; pt eating 100% of meals. Pt ordered for Glucerna and Nepro; pt seems to prefer the Glucerna so will discontinue the Nepro. Pt is currently refeeding; electrolytes being replaced. Per chart, pt appears weight stable since admit but appears to be up ~23lbs from his UBW; pt +4.5L on his I & Os. Pt continues to have high ostomy output. Pt is noted to have mild edema. Plan is for transfer to CIR once medically stable.   Medications reviewed and include: Augmentin, folic acid, lasix, nicotine, insulin, MVI, miralax, protonix, Kphos   Labs reviewed: Na 134(L), K 3.4(L), P 2.1(L), Mg 1.5(L) Wbc- 12.9(H), Hgb 10.5(L), Hct 29.8(L) Cbgs- 68, 29, 105, 183 x 24 hrs  Diet Order:    Diet Order             Diet Carb Modified Fluid consistency: Thin; Room service appropriate? Yes  Diet effective now                  EDUCATION NEEDS:   Education needs have been addressed  Skin:  Skin Assessment: Reviewed RN Assessment (incision abdomen)  Last BM:  8/16- 1031m via  ostomy  Height:   Ht Readings from Last 1 Encounters:  06/13/21 '6\' 2"'$  (1.88 m)    Weight:   Wt Readings from Last 1 Encounters:  06/23/21 112.5 kg    Ideal Body Weight:  86.3 kg  BMI:  Body mass index is 31.84 kg/m.  Estimated Nutritional Needs:   Kcal:  2300-2600kcal/day  Protein:  115-130g/day  Fluid:  2.6-2.9L/day  CKoleen DistanceMS, RD, LDN Please refer to [facility]for RD and/or RD on-call/weekend/after hours pager

## 2021-06-23 NOTE — Progress Notes (Signed)
[facility] Cardiology    SUBJECTIVE: Patient feels much better ready to be discharged to rehab and is ready to get another opportunity no pain no shortness of breath   Vitals:   06/23/21 0401 06/23/21 0910 06/23/21 1119 06/23/21 1548  BP:  120/85 132/62 120/68  Pulse:  66 88 85  Resp:  '18 18 18  '$ Temp:  98 F (36.7 C) 98 F (36.7 C) 98.1 F (36.7 C)  TempSrc:      SpO2:  99% 98% 99%  Weight: 112.5 kg     Height:         Intake/Output Summary (Last 24 hours) at 06/23/2021 1656 Last data filed at 06/23/2021 1532 Gross per 24 hour  Intake 1336.66 ml  Output 2120 ml  Net -783.34 ml      PHYSICAL EXAM  General: Well developed, well nourished, in no acute distress HEENT:  Normocephalic and atramatic Neck:  No JVD.  Lungs: Clear bilaterally to auscultation and percussion. Heart: HRRR . Normal S1 and S2 without gallops or murmurs.  Abdomen: Bowel sounds are positive, abdomen soft and non-tender  Msk:  Back normal, normal gait. Normal strength and tone for age. Extremities: No clubbing, cyanosis or edema.   Neuro: Alert and oriented X 3. Psych:  Good affect, responds appropriately   LABS: Basic Metabolic Panel: Recent Labs    06/22/21 0455 06/23/21 0425  NA 135 134*  K 2.9* 3.4*  CL 103 102  CO2 26 25  GLUCOSE 131* 185*  BUN 7 7  CREATININE 0.62 0.71  CALCIUM 7.2* 7.0*  MG 1.6* 1.5*  PHOS 2.1* 2.1*   Liver Function Tests: No results for input(s): AST, ALT, ALKPHOS, BILITOT, PROT, ALBUMIN in the last 72 hours. No results for input(s): LIPASE, AMYLASE in the last 72 hours. CBC: Recent Labs    06/21/21 0530 06/22/21 0455  WBC 13.9* 12.9*  NEUTROABS 9.3* 8.8*  HGB 9.7* 10.5*  HCT 27.5* 29.8*  MCV 95.8 96.1  PLT 282 330   Cardiac Enzymes: No results for input(s): CKTOTAL, CKMB, CKMBINDEX, TROPONINI in the last 72 hours. BNP: Invalid input(s): POCBNP D-Dimer: No results for input(s): DDIMER in the last 72 hours. Hemoglobin A1C: No results for input(s):  HGBA1C in the last 72 hours. Fasting Lipid Panel: No results for input(s): CHOL, HDL, LDLCALC, TRIG, CHOLHDL, LDLDIRECT in the last 72 hours. Thyroid Function Tests: No results for input(s): TSH, T4TOTAL, T3FREE, THYROIDAB in the last 72 hours.  Invalid input(s): FREET3 Anemia Panel: Recent Labs    06/23/21 0425 06/23/21 1257  VITAMINB12  --  738  TIBC 115*  --   IRON 60  --     No results found.   Echo normal overall left ventricular function of 60  TELEMETRY: Atrial fibrillation rate of 60:  ASSESSMENT AND PLAN:  Principal Problem:   Bowel perforation ([place]) Active Problems:   Essential hypertension   Hypocalcemia   Hypomagnesemia   Alcohol use disorder, moderate, dependence (HCC)   Type 1 diabetes mellitus with hyperglycemia (HCC)   AF (paroxysmal atrial fibrillation) (HCC)   Terminal ileitis (HCC)   Hypotension    Continue anticoagulation for atrial fibrillation Status post bowel perforation with colostomy Hartman Continue hypertension management and control Agree with amiodarone therapy will reduce to 200 mg a day Maintain diltiazem 180 mg daily Agree with rehab Advised refrain from tobacco abuse Continue diabetes management and control  Yolonda Kida, MD, 06/23/2021 4:56 PM

## 2021-06-23 NOTE — Progress Notes (Signed)
Patient ID: Russell Gomez, male   DOB: 12/14/1960, 60 y.o.   MRN: [id number]     [facility] [place] Hospital Day(s): 9.   Interval History: Patient seen and examined, no acute events or new complaints overnight. Patient reports feeling well.  Denies any nausea or vomiting.  He tolerated regular diet.  Vital signs in last 24 hours: [min-max] current  Temp:  [97.9 F (36.6 C)-98.6 F (37 C)] 98.1 F (36.7 C) (08/16 1548) Pulse Rate:  [66-88] 85 (08/16 1548) Resp:  [16-18] 18 (08/16 1548) BP: (93-141)/(62-124) 120/68 (08/16 1548) SpO2:  [98 %-99 %] 99 % (08/16 1548) Weight:  [112.5 kg] 112.5 kg (08/16 0401)     Height: '6\' 2"'$  (188 cm) Weight: 112.5 kg BMI (Calculated): 31.83   Physical Exam:  Constitutional: alert, cooperative and no distress  Respiratory: breathing non-labored at rest  Cardiovascular: regular rate and sinus rhythm  Gastrointestinal: soft, non-tender, and non-distended.  Colostomy pink and patent  Labs:  CBC Latest Ref Rng & Units 06/22/2021 06/21/2021 06/19/2021  WBC 4.0 - 10.5 K/uL 12.9(H) 13.9(H) 18.2(H)  Hemoglobin 13.0 - 17.0 g/dL 10.5(L) 9.7(L) 9.7(L)  Hematocrit 39.0 - 52.0 % 29.8(L) 27.5(L) 27.9(L)  Platelets 150 - 400 K/uL 330 282 249   CMP Latest Ref Rng & Units 06/23/2021 06/22/2021 06/21/2021  Glucose 70 - 99 mg/dL 185(H) 131(H) 209(H)  BUN 6 - 20 mg/dL '7 7 8  '$ Creatinine 0.61 - 1.24 mg/dL 0.71 0.62 0.64  Sodium 135 - 145 mmol/L 134(L) 135 138  Potassium 3.5 - 5.1 mmol/L 3.4(L) 2.9(L) 3.0(L)  Chloride 98 - 111 mmol/L 102 103 104  CO2 22 - 32 mmol/L '25 26 26  '$ Calcium 8.9 - 10.3 mg/dL 7.0(L) 7.2(L) 7.0(L)  Total Protein 6.5 - 8.1 g/dL - - -  Total Bilirubin 0.3 - 1.2 mg/dL - - -  Alkaline Phos 38 - 126 U/L - - -  AST 15 - 41 U/L - - -  ALT 0 - 44 U/L - - -    Imaging studies: No new pertinent imaging studies   Assessment/Plan:  60 y.o. male with diverticulitis with perforation 9 Day Post-Op s/p Hartman's procedure, complicated by pertinent  comorbidities including type 1 diabetes, A. fib and on anticoagulation, chronic alcohol abuse.  Patient recovering slowly but adequately.  Tolerated regular diet.  Ostomy output decreasing.  I think that this will continue decreased now the patient is eating solid food.  We will continue to monitor output.  Otherwise no contraindication to discharge patient from surgical standpoint when medically stable.  Arnold Long, MD

## 2021-06-23 NOTE — Progress Notes (Signed)
Inpatient Diabetes Program Recommendations  AACE/ADA: New Consensus Statement on Inpatient Glycemic Control (2015)  Target Ranges:  Prepandial:   less than 140 mg/dL      Peak postprandial:   less than 180 mg/dL (1-2 hours)      Critically ill patients:  140 - 180 mg/dL  Results for Russell Gomez, Russell Gomez (MRN [id number]) as of 06/23/2021 06:52  Ref. Range 06/21/2021 23:56 06/22/2021 03:51 06/22/2021 04:21 06/22/2021 08:15 06/22/2021 11:54 06/22/2021 16:55 06/22/2021 20:20  Glucose-Capillary Latest Ref Range: 70 - 99 mg/dL 177 (H)  1 unit NOVOLOG  49 (L) 99 86 104 (H)  4 units NOVOLOG '@1323'$   15 units Semglee '@1322'$  131 (H)  4 units NOVOLOG '@1802'$  138 (H)       15 units Semglee '@2116'$   Results for Russell Gomez, Russell Gomez (MRN [id number]) as of 06/23/2021 06:52  Ref. Range 06/23/2021 01:42 06/23/2021 03:50  Glucose-Capillary Latest Ref Range: 70 - 99 mg/dL 68 (L) 29 (LL)   History: Type 1 Diabetes Needs Basal, Correction, and Meal Coverage ETOH Abuse   Home DM Meds: Levemir 15 units Daily                             Regular 5-9 units per SSI   Current Orders: Semglee 15 units BID                            Novolog 0-6 units Q4 hours      Novolog 4 units TID with meals         Allowed PO diet Also getting Glucerna PO supps 237 ml BID between meals Has orders for Nepro PO supps as well     MD- Note Severe Hypoglycemia this AM--only takes Levemir 15 units Daily at home.  Of note, pt received total of 30 units basal insulin yesterday.     Please consider:   1. Reduce Semglee to 15 units Daily (home dose)--currently ordered BID   2. Change the Novolog SSi to TID AC + HS (currently ordered Q4H)   3. Stop the Nepro PO supplements (Has orders for Glucerna PO supplements)    --Will follow patient during hospitalization--  Wyn Quaker RN, MSN, CDE Diabetes Coordinator Inpatient Glycemic Control Team Team Pager: [phone] (8a-5p)

## 2021-06-23 NOTE — Progress Notes (Addendum)
Inpatient Rehabilitation Admissions Coordinator   Inpatient rehab consult received. I spoke with patient by phone to discuss goals and expectations of a possible CIR Admit. Prior to admit patient had quit his job. He is currently separated from his wife and living alone. He will need to be Mod I to return home. Has made great progress with therapy and would benefit from a CIR admit pending insurance approval if policy is active and bed availability. I discussed cost of care if insurance is inactive. He is asking about disability and Medicaid applications.  I will verify insurance and pursue Auth if active.  Danne Baxter, RN, MSN Rehab Admissions Coordinator [phone] 06/23/2021 [id number] AM  [employer] policy active until [id number] when it will be terminated. Insurance auth began

## 2021-06-24 DIAGNOSIS — K631 Perforation of intestine (nontraumatic): Secondary | ICD-10-CM | POA: Diagnosis not present

## 2021-06-24 LAB — BASIC METABOLIC PANEL
Anion gap: 6 (ref 5–15)
BUN: 6 mg/dL (ref 6–20)
CO2: 26 mmol/L (ref 22–32)
Calcium: 7.1 mg/dL — ABNORMAL LOW (ref 8.9–10.3)
Chloride: 103 mmol/L (ref 98–111)
Creatinine, Ser: 0.66 mg/dL (ref 0.61–1.24)
GFR, Estimated: >60 mL/min (ref >60)
Glucose, Bld: 154 mg/dL — ABNORMAL HIGH (ref 70–99)
Potassium: 3.9 mmol/L (ref 3.5–5.1)
Sodium: 135 mmol/L (ref 135–145)

## 2021-06-24 LAB — CBC WITH DIFFERENTIAL/PLATELET
Abs Immature Granulocytes: 0.31 10*3/uL — ABNORMAL HIGH (ref 0.00–0.07)
Basophils Absolute: 0.1 10*3/uL (ref 0.0–0.1)
Basophils Relative: 1 %
Eosinophils Absolute: 0.2 10*3/uL (ref 0.0–0.5)
Eosinophils Relative: 1 %
HCT: 28.2 % — ABNORMAL LOW (ref 39.0–52.0)
Hemoglobin: 9.7 g/dL — ABNORMAL LOW (ref 13.0–17.0)
Immature Granulocytes: 2 %
Lymphocytes Relative: 22 %
Lymphs Abs: 3.1 10*3/uL (ref 0.7–4.0)
MCH: 33.4 pg (ref 26.0–34.0)
MCHC: 34.4 g/dL (ref 30.0–36.0)
MCV: 97.2 fL (ref 80.0–100.0)
Monocytes Absolute: 0.8 10*3/uL (ref 0.1–1.0)
Monocytes Relative: 6 %
Neutro Abs: 9.6 10*3/uL — ABNORMAL HIGH (ref 1.7–7.7)
Neutrophils Relative %: 68 %
Platelets: 443 10*3/uL — ABNORMAL HIGH (ref 150–400)
RBC: 2.9 MIL/uL — ABNORMAL LOW (ref 4.22–5.81)
RDW: 16 % — ABNORMAL HIGH (ref 11.5–15.5)
WBC: 14 10*3/uL — ABNORMAL HIGH (ref 4.0–10.5)
nRBC: 0 % (ref 0.0–0.2)

## 2021-06-24 LAB — GLUCOSE, CAPILLARY
Glucose-Capillary: 105 mg/dL — ABNORMAL HIGH (ref 70–99)
Glucose-Capillary: 190 mg/dL — ABNORMAL HIGH (ref 70–99)
Glucose-Capillary: 213 mg/dL — ABNORMAL HIGH (ref 70–99)
Glucose-Capillary: 225 mg/dL — ABNORMAL HIGH (ref 70–99)

## 2021-06-24 LAB — PHOSPHORUS: Phosphorus: 2.7 mg/dL (ref 2.5–4.6)

## 2021-06-24 LAB — MAGNESIUM: Magnesium: 1.7 mg/dL (ref 1.7–2.4)

## 2021-06-24 MED ORDER — AMIODARONE HCL 200 MG PO TABS
200.0000 mg | ORAL_TABLET | Freq: Every day | ORAL | Status: DC
  Administered 2021-06-25: 200 mg via ORAL
  Filled 2021-06-24: qty 1

## 2021-06-24 MED ORDER — PANTOPRAZOLE SODIUM 40 MG PO TBEC
40.0000 mg | DELAYED_RELEASE_TABLET | Freq: Every day | ORAL | Status: DC
  Administered 2021-06-24: 40 mg via ORAL
  Filled 2021-06-24: qty 1

## 2021-06-24 MED ORDER — DIAZEPAM 5 MG PO TABS
2.5000 mg | ORAL_TABLET | Freq: Two times a day (BID) | ORAL | Status: DC
  Administered 2021-06-24 – 2021-06-25 (×2): 2.5 mg via ORAL
  Filled 2021-06-24 (×2): qty 1

## 2021-06-24 MED ORDER — FUROSEMIDE 20 MG PO TABS
20.0000 mg | ORAL_TABLET | Freq: Every day | ORAL | Status: DC
  Administered 2021-06-25: 20 mg via ORAL
  Filled 2021-06-24: qty 1

## 2021-06-24 NOTE — PMR Pre-admission (Signed)
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 Date: 10/08/2020 and TERMS 07/08/21     Deduct: $3000      Out of Pocket Max: $7000      Life Max: none CIR: 90%      SNF: 90% 60 days Outpatient: 90%     Co-Pay: 30 visits combined Home Health: 90%      Co-Pay: visits per medical neccesity DME: 90%     Co-Pay: 10% Providers: in network  SECONDARY: none  Financial Counselor:       Phone#:   [employer], [employer]" for patients in Inpatient Rehabilitation Facilities with attached "Privacy Act [place] Records" was provided and verbally reviewed with: N/A  Emergency Contact Information Contact Information     Name Relation Home Work Mobile   [place] Son   [phone]   Tajohn, Talbert [phone]  [phone]       Current Medical History  Patient Admitting Diagnosis: Debility  History of Present Illness:  60 year old right-handed male with history of alcohol /tobacco abuse, atrial fibrillation maintained on Eliquis followed by Dr. Clayborn Bigness, diabetes mellitus, hyperlipidemia.  Per chart review patient lives alone currently separated from his wife.  Presented 06/14/2021 to [facility] with nausea, vomiting, nonbloody diarrhea and blood sugars greater than 600 x 3 to 4 days.  Admission chemistry sodium 134 chloride 97 glucose 443, lipase 18, hemoglobin 14.5, WBC 11,600, lactic acid 4.5, urinalysis negative nitrite, magnesium 1 urine drug screen positive marijuana  COVID PCR negative, C. difficile PCR negative.  CT abdomen pelvis showed hyperenhancement of the wall of the terminal ileum with small amount of free fluid and punctate focus of free air in the ventral abdomen as well as multifocal punctate free air within the peritoneal cavity indicating possible perforation.  General surgery consulted patient underwent sigmoid colon resection with colostomy 06/14/2021 per Dr.Cintron Ferrel Logan.  Hospital course complicated by acute blood loss anemia 10.5.  Bouts of hypokalemia supplement added.  Intermittent bouts of orthostasis maintained on ProAmatine.  Atrial fibrillation remains controlled maintained on Cardizem as well as amiodarone and his Eliquis was resumed postoperatively.  Bouts of agitation restlessness suspect alcohol withdrawal initially maintained on stepdown unit with CIWA protocol.  Aspiration pneumonia completing course of Augmentin and his diet has been advanced to regular consistency.    Patient's medical record from [facility] has been reviewed by the rehabilitation admission coordinator and physician.  Past Medical History  Past Medical History:  Diagnosis Date   Arthritis    on operative finger   Dental crowns present    Diabetes type 1, uncontrolled ([place])    IDDM (Dr. Eddie Dibbles @ [facility])   Esophageal reflux    Goiter 04/2012   no current med, has yearly monitoring   High cholesterol    HTN (hypertension)  under control with meds, has been on med x 2 yr.    Family History   family history includes Coronary artery disease in his maternal uncle; Diabetes in his maternal uncle, paternal grandmother, and paternal uncle; Healthy in his father; Hyperlipidemia in his mother; Hypertension in his mother; Stroke in his maternal aunt.  Prior Rehab/Hospitalizations Has the patient had prior rehab or hospitalizations prior to admission? Yes  Has the patient had major surgery during 100 days prior to admission? Yes   Current Medications  Current  Facility-Administered Medications:    acetaminophen (TYLENOL) tablet 650 mg, 650 mg, Oral, Q6H PRN, 650 mg at 06/21/21 1453 **OR** acetaminophen (TYLENOL) suppository 650 mg, 650 mg, Rectal, Q6H PRN, Herbert Pun, MD   amiodarone (PACERONE) tablet 200 mg, 200 mg, Oral, Daily, Callwood, Dwayne D, MD, 200 mg at 06/25/21 [id number]   apixaban (ELIQUIS) tablet 5 mg, 5 mg, Oral, BID, Sharen Hones, MD, 5 mg at 06/25/21 [id number]   Chlorhexidine Gluconate Cloth 2 % PADS 6 each, 6 each, Topical, Daily, Sharen Hones, MD, 6 each at 06/25/21 0830   dextrose 50 % solution 0-50 mL, 0-50 mL, Intravenous, PRN, Danford, Suann Larry, MD, 25 mL at 06/23/21 0405   diazepam (VALIUM) tablet 2.5 mg, 2.5 mg, Oral, Q12H, Wouk, Ailene Rud, MD, 2.5 mg at 06/25/21 [id number]   diltiazem (CARDIZEM CD) 24 hr capsule 180 mg, 180 mg, Oral, Daily, Sharen Hones, MD, 180 mg at 06/25/21 0831   feeding supplement (GLUCERNA SHAKE) (GLUCERNA SHAKE) liquid 237 mL, 237 mL, Oral, BID BM, Sharen Hones, MD, 237 mL at 06/25/21 0831   fentaNYL (SUBLIMAZE) injection 25 mcg, 25 mcg, Intravenous, Q4H PRN, Graves, Dana E, NP, 25 mcg at [id number]   folic acid (FOLVITE) tablet 1 mg, 1 mg, Oral, Daily, Cintron-Diaz, Edgardo, MD, 1 mg at 06/25/21 [id number]   furosemide (LASIX) tablet 20 mg, 20 mg, Oral, Daily, Callwood, Dwayne D, MD, 20 mg at 06/25/21 0828   insulin aspart (novoLOG) injection 0-5 Units, 0-5 Units, Subcutaneous, QHS, Sharen Hones, MD, 2 Units at 06/24/21 2204   insulin aspart (novoLOG) injection 0-9 Units, 0-9 Units, Subcutaneous, TID WC, Berta Minor, RPH, 7 Units at 06/25/21 0829   insulin glargine-yfgn (SEMGLEE) injection 15 Units, 15 Units, Subcutaneous, QHS, Sharen Hones, MD, 15 Units at 06/24/21 2204   multivitamin with minerals tablet 1 tablet, 1 tablet, Oral, Daily, Kasa, Maretta Bees, MD, 1 tablet at 06/25/21 [id number]   nicotine (NICODERM CQ - dosed in mg/24 hours) patch 21 mg, 21 mg, Transdermal, Daily, Cintron-Diaz, Reeves Forth, MD, 21 mg at  06/25/21 [id number]   [DISCONTINUED] ondansetron (ZOFRAN-ODT) disintegrating tablet 4 mg, 4 mg, Oral, Q6H PRN **OR** ondansetron (ZOFRAN) injection 4 mg, 4 mg, Intravenous, Q6H PRN, Windell Moment, Edgardo, MD, 4 mg at 06/20/21 0825   pantoprazole (PROTONIX) EC tablet 40 mg, 40 mg, Oral, QHS, Wouk, Ailene Rud, MD, 40 mg at 06/24/21 2205   polyethylene glycol (MIRALAX / GLYCOLAX) packet 17 g, 17 g, Oral, Daily, Cintron-Diaz, Edgardo, MD, 17 g at 06/25/21 0829   sodium chloride flush (NS) 0.9 % injection 10-40 mL, 10-40 mL, Intracatheter, Q12H, Cintron-Diaz, Edgardo, MD, 10 mL at 06/25/21 0831   sodium chloride flush (NS) 0.9 % injection 10-40 mL, 10-40 mL, Intracatheter, PRN, Herbert Pun, MD  Patients Current Diet:  Diet Order             Diet Carb Modified Fluid consistency: Thin; Room service appropriate? Yes  Diet effective now  Precautions / Restrictions Precautions Precautions: Fall Precaution Comments: ostomy bag, JP drain,abd incision Restrictions Weight Bearing Restrictions: No   Has the patient had 2 or more falls or a fall with injury in the past year? 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 Independent  Indoor Mobility: Did the patient need assistance with walking from room to room (with or without device)? Independent  Stairs: Did the patient need assistance with internal or external stairs (with or without device)? Independent  Functional Cognition: Did the patient need help planning regular tasks such as shopping or remembering to take medications? Independent  Home Assistive Devices / Equipment Home Assistive Devices/Equipment: None Home Equipment: None  Prior Device Use: Indicate devices/aids used by the patient prior to current illness, exacerbation or injury? None  of the above  Current Functional Level Cognition  Overall Cognitive Status: Within Functional Limits for tasks assessed Orientation Level: Oriented X4 General Comments: pt is A and O and very motivated    Extremity Assessment (includes Sensation/Coordination)  Upper Extremity Assessment: Generalized weakness  Lower Extremity Assessment: Generalized weakness    ADLs  Overall ADL's : Needs assistance/impaired Grooming: Wash/dry hands, Wash/dry face, Sitting, Set up Lower Body Dressing: Moderate assistance, Maximal assistance, Sitting/lateral leans Lower Body Dressing Details (indicate cue type and reason): to don socks in sitting Toilet Transfer: Moderate assistance, RW Functional mobility during ADLs: Min guard, Minimal assistance, Cueing for sequencing, Rolling walker (for fxl mobility ~30' for fxl HH distances)    Mobility  Overal bed mobility: Needs Assistance Bed Mobility: Supine to Sit Supine to sit: HOB elevated, Min assist Sit to supine: HOB elevated, Min assist General bed mobility comments: increased time    Transfers  Overall transfer level: Needs assistance Equipment used: Rolling walker (2 wheeled) Transfers: Sit to/from Stand Sit to Stand: Min assist, Mod assist Stand pivot transfers: Mod assist General transfer comment: CGA/MIN A for ADL transfers from elevated surface, MIN/MOD A from standard height bed.    Ambulation / Gait / Stairs / Wheelchair Mobility  Ambulation/Gait Ambulation/Gait assistance: Herbalist (Feet): 75 Feet Assistive device: Rolling walker (2 wheeled) Gait Pattern/deviations: Decreased step length - right, Decreased step length - left, Decreased stride length, Decreased dorsiflexion - right, Shuffle General Gait Details: pt tolerated increased gait distances today with vital stable throughout. Gait velocity: decreased    Posture / Balance Balance Overall balance assessment: Needs assistance Sitting-balance support: Feet  supported, Bilateral upper extremity supported Sitting balance-Leahy Scale: Good Standing balance support: Bilateral upper extremity supported, During functional activity Standing balance-Leahy Scale: Fair Standing balance comment: requires UE Support and CGA to sustain balance for static standing and fxl mobility, MIN A for pivoting/turning    Special needs/care consideration                      Signed      Show:Clear all '[x]'$ Written'[x]'$ Templated'[x]'$ Copied  Added by: '[x]'$ Meryle Ready, RN  '[]'$ Hover for details                                  WOC Nurse ostomy follow up Patient receiving [facility] in [facility]. Stoma type/location: LUQ colostomy Stomal assessment/size: oval, 1 3/4 inches. Necrotic edge from 4 - 6 o'clock remains present. Sutures intact. Peristomal  assessment: intact Treatment options for stomal/peristomal skin:  Output: thin brown Ostomy pouching: 2pc. Barrier ring, # [id number], wafer # 2, pouch # [id number] Education provided: With my guidance and assistance, patient emptied existing pouch, removed existing pouching system, and performed all steps of application of new pouching system.  He also told me he emptied his pouch several times yesterday. He had several appropriate questions that were answered to his expressed satisfaction. Enrolled patient in [employer] Start Discharge program: Yes   If present in hospital Friday, I plan to return for another pouching change session.  He is to check with his wife and find out if the [employer] program box arrived to his home. Val Riles, RN, MSN, CWOCN, CNS-BC, pager [phone]            Note Details  Author Meryle Ready, RN File Time 06/23/2021  8:05 AM  Author Type Registered Nurse Status Signed  Last Editor Meryle Ready, RN Service [place] # [id number] Admit Date 06/14/2021     Previous Home Environment  Living Arrangements:  (seperated from his wife for 2 years)  Lives With:  Alone Available Help at Discharge: Family, Available PRN/intermittently Type of Home: House Home Layout: One level Home Access: Stairs to enter Entrance Stairs-Rails: Left, Right Entrance Stairs-Number of Steps: 3 Bathroom Shower/Tub: Chiropodist: Standard Bathroom Accessibility: Yes How Accessible: Accessible via walker Worth: No  Discharge Living Setting Plans for Discharge Living Setting: Patient's home, Alone (seperated from  his wife for 2 years) Type of Home at Discharge: House Discharge Home Layout: One level Discharge Home Access: Stairs to enter Entrance Stairs-Rails: Right, Left Entrance Stairs-Number of Steps: 3 Discharge Bathroom Shower/Tub: Tub/shower unit Discharge Bathroom Toilet: Standard Discharge Bathroom Accessibility: Yes How Accessible: Accessible via walker Does the patient have any problems obtaining your medications?:  (insurance terms on 07/08/21)  Social/Family/Support Systems Patient Roles: Spouse, Parent Contact Information: wife. Cecille Rubin and son, Legrand Como Anticipated Caregiver: wife and family intermittent only Anticipated Caregiver's Contact Information: see above Ability/Limitations of Caregiver: they are seperated; son works days Caregiver Availability: Intermittent Discharge Plan Discussed with Primary Caregiver: Yes Is Caregiver In Agreement with Plan?: Yes Does Caregiver/Family have Issues with Lodging/Transportation while Pt is in Rehab?: No  Goals Patient/Family Goal for Rehab: Mod I with PT and OT Expected length of stay: ELOS 7 days Pt/Family Agrees to Admission and willing to participate: Yes Program Orientation Provided & Reviewed with Pt/Caregiver Including Roles  & Responsibilities: Yes  Decrease burden of Care through IP rehab admission: n/a  Possible need for SNF placement upon discharge: not anticipated  Patient Condition: I have reviewed medical records from [facility], spoken with  patient. I  discussed via phone for inpatient rehabilitation assessment.  Patient will benefit from ongoing PT and OT, can actively participate in 3 hours of therapy a day 5 days of the week, and can make measurable gains during the admission.  Patient will also benefit from the coordinated team approach during an Inpatient Acute Rehabilitation admission.  The patient will receive intensive therapy as well as Rehabilitation physician, nursing, social worker, and care management interventions.  Due to bladder management, bowel management, safety, skin/wound care, disease management, medication administration, pain management, and patient education the patient requires 24 hour a day rehabilitation nursing.  The patient is currently min assist overall with mobility and basic ADLs.  Discharge setting and therapy post discharge at home with home health is anticipated.  Patient has  agreed to participate in the Acute Inpatient Rehabilitation Program and will admit today.  Preadmission Screen Completed By:  Cleatrice Burke, 06/25/2021 10:15 AM ______________________________________________________________________   Discussed status with Dr. Ranell Patrick on 06/25/2021 at 1015 and received approval for admission today.  Admission Coordinator:  Cleatrice Burke, RN, time [id number] Date  06/25/2021   Assessment/Plan: Diagnosis: Debility Does the need for close, 24 hr/day Medical supervision in concert with the patient's rehab needs make it unreasonable for this patient to be served in a less intensive setting? Yes Co-Morbidities requiring supervision/potential complications: new colostomy, obesity, essential HTN, hypocalcemia, moderate alcohol use disorder Due to bladder management, bowel management, safety, skin/wound care, disease management, medication administration, pain management, and patient education, does the patient require 24 hr/day rehab nursing? Yes Does the patient require coordinated care of a physician,  rehab nurse, PT, OT to address physical and functional deficits in the context of the above medical diagnosis(es)? Yes Addressing deficits in the following areas: balance, endurance, locomotion, strength, transferring, bowel/bladder control, bathing, dressing, feeding, grooming, toileting, and psychosocial support Can the patient actively participate in an intensive therapy program of at least 3 hrs of therapy 5 days a week? Yes The potential for patient to make measurable gains while on inpatient rehab is excellent Anticipated functional outcomes upon discharge from inpatient rehab: modified independent PT, modified independent OT, independent SLP Estimated rehab length of stay to reach the above functional goals is: 5-7 days Anticipated discharge destination: Home 10. Overall Rehab/Functional Prognosis: excellent   MD Signature: Leeroy Cha, MD

## 2021-06-24 NOTE — Progress Notes (Signed)
PROGRESS NOTE    Russell Gomez  [id number] DOB: 1961/07/29 DOA: 06/14/2021 PCP: Ria Bush, MD   Chief complaint.  Abdominal pain. Brief Narrative:  60 yo male who presented to [facility] ER on 08/7 with a 3 day hx of lower abdominal pain.  He does have a hx of chronic ETOH abuse, however reported his last drink was 08/4.  CT Abd Pelvis revealed diverticular disease with perforation.  Therefore, General Surgery consulted and pt underwent emergent sigmoid colon resection with colostomy (Hartmann's procedure) and appendectomy.  He was subsequently admitted to the stepdown unit postop for additional workup and treatment.  However, on 08/9 hospital course complicated by severe ETOH withdrawal symptoms despite administration of ativan per CIWA protocol.  Therefore, precedex gtt ordered and PCCM team consulted to assist with plan of care. Patient was transferred to medical floor on 8/11.   Assessment & Plan:   Principal Problem:   Bowel perforation (HCC) Active Problems:   Essential hypertension   Hypocalcemia   Hypomagnesemia   Alcohol use disorder, moderate, dependence (HCC)   Type 1 diabetes mellitus with hyperglycemia (HCC)   AF (paroxysmal atrial fibrillation) (HCC)   Terminal ileitis (HCC)   Hypotension  Paroxysmal atrial fibrillation with rapid major response. Hypotension secondary to tachycardia. Patient develop paroxysmal atrial fibrillation postop.  Was placed on Eliquis, amiodarone drip and diltiazem. Cardiology following, reducing dose of amio today. Continuing lasix. Discontinuing milronone as hypotension resolved  Alcohol withdrawal with delirium tremens. Resolved. Currently receiving diazepam scheduled q6, will space out to q12  Aspiration pneumonia secondary to alcohol withdrawal. Perforated colonic ulcer Patient initially treated with Zosyn, currently on Augmentin, will discontinue, is pod 10, has received 11 days abx. Will d/c picc today   Diverticulosis with  perforation. Status post a Hartmann procedure.  Patient doing well, bowel has recovered.  Discontinuing abx today. Querying gen surg about JP abdominal drain. Continuing ostomy care. Will need gen surg f/u in 1 week.  Type 1 diabetes uncontrolled with hyperglycemia and hypoglycemia. Cont lantus, SSI  Generalized weakness. Patient is pending for CIR transfer, scheduled for 8/18  Severe hypoalbuminemia. Continue protein supplements.   DVT prophylaxis: Loveox Code Status: full Family Communication:  Disposition Plan:    Status is: Inpatient  Remains inpatient appropriate because:IV treatments appropriate due to intensity of illness or inability to take PO and Inpatient level of care appropriate due to severity of illness  Dispo: The patient is from: Home              Anticipated d/c is to: CIR              Patient currently is not medically stable to d/c.   Difficult to place patient No        I/O last 3 completed shifts: In: 1480.1 [P.O.:960; I.V.:10; IV Piggyback:510.1] Out: [id number] [Urine:2450; Drains:290; Stool:550] Total I/O In: -  Out: 107 [Urine:650; Drains:80; [id number]     Consultants:  General surgery  Procedures: Bowel  Antimicrobials:  Augmentin  Subjective: Patient is doing well, he is tolerating soft diet, he has significant amount of stool output from ostomy bag.  No nausea vomiting, Denies any short of breath or cough. No dysuria hematuria No fever or chills. No chest pain palpitation.  Objective: Vitals:   06/23/21 2121 06/24/21 0324 06/24/21 0733 06/24/21 1120  BP: 118/68 132/72 138/77 127/66  Pulse: 82 75 76 91  Resp: '17 20 20 18  '$ Temp: 98.6 F (37 C) 98 F (36.7 C) 98.8 F (  37.1 C) 98.5 F (36.9 C)  TempSrc:  Oral Oral Oral  SpO2: 98% 99% 100% 100%  Weight:  109.5 kg    Height:        Intake/Output Summary (Last 24 hours) at 06/24/2021 1442 Last data filed at 06/24/2021 1101 Gross per 24 hour  Intake 760.1 ml  Output 3080 ml   Net -2319.9 ml   Filed Weights   06/22/21 0530 06/23/21 0401 06/24/21 0324  Weight: 111.4 kg 112.5 kg 109.5 kg    Examination:  General exam: Appears calm and comfortable  Respiratory system: Clear to auscultation. Respiratory effort normal. Cardiovascular system: S1 & S2 heard, RRR. No JVD, murmurs, rubs, gallops or clicks.  Gastrointestinal system: Abdomen is nondistended, soft and nontender. No organomegaly or masses felt. Normal bowel sounds heard. Stapled midline incision, ostomy with output, jp drain draining yellow serous fluid Central nervous system: Alert and oriented. No focal neurological deficits. Extremities: 2-3+ leg edema Skin: No rashes, lesions or ulcers Psychiatry: Judgement and insight appear normal. Mood & affect appropriate.     Data Reviewed: I have personally reviewed following labs and imaging studies  CBC: Recent Labs  Lab 06/18/21 0447 06/19/21 0853 06/21/21 0530 06/22/21 0455 06/24/21 0500  WBC 9.4 18.2* 13.9* 12.9* 14.0*  NEUTROABS 5.3  --  9.3* 8.8* 9.6*  HGB 10.6* 9.7* 9.7* 10.5* 9.7*  HCT 30.8* 27.9* 27.5* 29.8* 28.2*  MCV 99.4 100.7* 95.8 96.1 97.2  PLT 175 249 282 330 [id number]*   Basic Metabolic Panel: Recent Labs  Lab 06/19/21 0853 06/20/21 0529 06/21/21 0530 06/22/21 0455 06/23/21 0425 06/24/21 0500  NA 137  --  138 135 134* 135  K 3.9  --  3.0* 2.9* 3.4* 3.9  CL 104  --  104 103 102 103  CO2 20*  --  '26 26 25 26  '$ GLUCOSE 261*  --  209* 131* 185* 154*  BUN 12  --  '8 7 7 6  '$ CREATININE 0.88  --  0.64 0.62 0.71 0.66  CALCIUM 7.2*  --  7.0* 7.2* 7.0* 7.1*  MG  --  1.3* 1.6* 1.6* 1.5* 1.7  PHOS  --  2.5 3.0 2.1* 2.1* 2.7   GFR: Estimated Creatinine Clearance: 132.5 mL/min (by C-G formula based on SCr of 0.66 mg/dL). Liver Function Tests: No results for input(s): AST, ALT, ALKPHOS, BILITOT, PROT, ALBUMIN in the last 168 hours.  No results for input(s): LIPASE, AMYLASE in the last 168 hours. No results for input(s): AMMONIA in  the last 168 hours. Coagulation Profile: No results for input(s): INR, PROTIME in the last 168 hours. Cardiac Enzymes: No results for input(s): CKTOTAL, CKMB, CKMBINDEX, TROPONINI in the last 168 hours. BNP (last 3 results) No results for input(s): PROBNP in the last 8760 hours. HbA1C: No results for input(s): HGBA1C in the last 72 hours. CBG: Recent Labs  Lab 06/23/21 1121 06/23/21 1551 06/23/21 2118 06/24/21 0735 06/24/21 1122  GLUCAP 183* 343* 318* 105* 190*   Lipid Profile: No results for input(s): CHOL, HDL, LDLCALC, TRIG, CHOLHDL, LDLDIRECT in the last 72 hours. Thyroid Function Tests: No results for input(s): TSH, T4TOTAL, FREET4, T3FREE, THYROIDAB in the last 72 hours. Anemia Panel: Recent Labs    06/23/21 0425 06/23/21 1257  VITAMINB12  --  738  TIBC 115*  --   IRON 60  --    Sepsis Labs: No results for input(s): PROCALCITON, LATICACIDVEN in the last 168 hours.  Recent Results (from the past 240 hour(s))  CULTURE, BLOOD (ROUTINE  X 2) w Reflex to ID Panel     Status: None   Collection Time: 06/15/21 11:46 AM   Specimen: BLOOD  Result Value Ref Range Status   Specimen Description BLOOD BLOOD LEFT ARM  Final   Special Requests   Final    BOTTLES DRAWN AEROBIC AND ANAEROBIC Blood Culture adequate volume   Culture   Final    NO GROWTH 5 DAYS Performed at [facility], [address] [place], [place] [address]    Report Status 06/20/2021 FINAL  Final  CULTURE, BLOOD (ROUTINE X 2) w Reflex to ID Panel     Status: None   Collection Time: 06/15/21 11:46 AM   Specimen: BLOOD  Result Value Ref Range Status   Specimen Description BLOOD BLOOD RIGHT ARM  Final   Special Requests   Final    BOTTLES DRAWN AEROBIC AND ANAEROBIC Blood Culture results may not be optimal due to an inadequate volume of blood received in culture bottles   Culture   Final    NO GROWTH 5 DAYS Performed at [facility], [address] [place], [place] [address]     Report Status 06/20/2021 FINAL  Final         Radiology Studies: No results found.      Scheduled Meds:  [START ON 06/25/2021] amiodarone  200 mg Oral Daily   apixaban  5 mg Oral BID   Chlorhexidine Gluconate Cloth  6 each Topical Daily   diazepam  2.5 mg Oral Q6H   diltiazem  180 mg Oral Daily   feeding supplement (GLUCERNA SHAKE)  237 mL Oral BID BM   folic acid  1 mg Oral Daily   [START ON 06/25/2021] furosemide  20 mg Oral Daily   insulin aspart  0-5 Units Subcutaneous QHS   insulin aspart  0-9 Units Subcutaneous TID WC   insulin glargine-yfgn  15 Units Subcutaneous QHS   multivitamin with minerals  1 tablet Oral Daily   nicotine  21 mg Transdermal Daily   pantoprazole  40 mg Oral QHS   polyethylene glycol  17 g Oral Daily   sodium chloride flush  10-40 mL Intracatheter Q12H   Continuous Infusions:     LOS: 10 days    Time spent: 40 minutes    Desma Maxim, MD [facility]   To contact the attending provider between 7A-7P or the covering provider during after hours 7P-7A, please log into the web site [email or web address] and access using universal [place] password for that web site. If you do not have the password, please call the hospital operator.  06/24/2021, 2:42 PM

## 2021-06-24 NOTE — Progress Notes (Signed)
Physical Therapy Treatment Patient Details Name: Russell Gomez MRN: [id number] DOB: 1961/09/12 Today's Date: 06/24/2021    History of Present Illness Pt is a 60 y/o M who presented to [facility] ER on 06/14/21 with a 3 day hx of lower abdominal pain. CT revealed diverticular disease with perforation. Pt underwent emergent sigmoid colon resection with colostomy & appendectomy. On 06/16/21 his hospital course was comlicated by severe ETOH withdrawal symptoms. Pt is currently being treated for paroxysmal a-fib with rapid major response. PMH: chronic EtOH abuse, uncontrolled DM1, high cholesterol, HTN    PT Comments    Pt was long sitting in bed upon arriving. Agrees to PT session and is cooperative throughout. He is highly motivated and eager to get to rehab to continue to recover to PLOF. Pt has JP drain, abdominal incision, and ostomy. He tolerated increased gait distance without cardiac concerns. HR reach 133 bpm at peak. Endorses fatigue but states," Not as bad as yesterday." Pt struggles standing from lower surface height. Performed STS 5 x form recliner with mod assist + vcs. Continues to present with BLE weakness. CIR most appropriate at DC to progress pt to PLOF.    Follow Up Recommendations  CIR     Equipment Recommendations  Other (comment) (defer to next level of care. pt eager to advance to ambulation without AD.)       Precautions / Restrictions Precautions Precautions: Fall Precaution Comments: ostomy bag, JP drain,abd incision Restrictions Weight Bearing Restrictions: No    Mobility  Bed Mobility Overal bed mobility: Needs Assistance Bed Mobility: Supine to Sit     Supine to sit: HOB elevated;Min assist          Transfers Overall transfer level: Needs assistance Equipment used: Rolling walker (2 wheeled) Transfers: Sit to/from Stand Sit to Stand: Min assist;Mod assist;From elevated surface         General transfer comment: min assist from elevated bed height,  mod assist from recliner  Ambulation/Gait Ambulation/Gait assistance: Min assist Gait Distance (Feet): 75 Feet Assistive device: Rolling walker (2 wheeled) Gait Pattern/deviations: Decreased step length - right;Decreased step length - left;Decreased stride length;Decreased dorsiflexion - right;Shuffle Gait velocity: decreased   General Gait Details: pt tolerated increased gait distances today with vital stable throughout.    Balance Overall balance assessment: Needs assistance Sitting-balance support: Feet supported;Bilateral upper extremity supported Sitting balance-Leahy Scale: Good     Standing balance support: Bilateral upper extremity supported;During functional activity Standing balance-Leahy Scale: Fair     Cognition Arousal/Alertness: Awake/alert Behavior During Therapy: WFL for tasks assessed/performed Overall Cognitive Status: Within Functional Limits for tasks assessed       General Comments: pt is A and O and very motivated             Pertinent Vitals/Pain Pain Assessment: No/denies pain     PT Goals (current goals can now be found in the care plan section) Acute Rehab PT Goals Patient Stated Goal: I want to go to rehab to get stronger Progress towards PT goals: Progressing toward goals    Frequency    7X/week      PT Plan Current plan remains appropriate       AM-PAC PT "6 Clicks" Mobility   Outcome Measure  Help needed turning from your back to your side while in a flat bed without using bedrails?: A Little Help needed moving from lying on your back to sitting on the side of a flat bed without using bedrails?: A Little Help needed  moving to and from a bed to a chair (including a wheelchair)?: A Little Help needed standing up from a chair using your arms (e.g., wheelchair or bedside chair)?: A Little Help needed to walk in hospital room?: A Little Help needed climbing 3-5 steps with a railing? : A Little 6 Click Score: 18    End of  Session Equipment Utilized During Treatment: Gait belt Activity Tolerance: Patient tolerated treatment well Patient left: in chair;with call bell/phone within reach;with chair alarm set Nurse Communication: Mobility status PT Visit Diagnosis: Unsteadiness on feet (R26.81);Muscle weakness (generalized) (M62.81);Difficulty in walking, not elsewhere classified (R26.2)     Time: [id number] PT Time Calculation (min) (ACUTE ONLY): 26 min  Charges:  $Gait Training: 8-22 mins $Therapeutic Activity: 8-22 mins                     Julaine Fusi PTA 06/24/21, 1:36 PM

## 2021-06-24 NOTE — Progress Notes (Signed)
Occupational Therapy Treatment Patient Details Name: Russell Gomez MRN: [id number] DOB: 1961/02/22 Today's Date: 06/24/2021    History of present illness Pt is a 60 y/o M who presented to [facility] on ER on 06/14/21 with a 3 day hx of lower abdominal pain. CT revealed diverticular disease with perforation. Pt underwent emergent sigmoid colon resection with colostomy & appendectomy. On 06/16/21 his hospital course was comlicated by severe ETOH withdrawal symptoms. Pt is currently being treated for paroxysmal a-fib with rapid major response. PMH: chronic EtOH abuse, uncontrolled DM1, high cholesterol, HTN   OT comments  Pt seen for OT tx this date to f/u re: safety with ADLs/ADL mobility. OT engages pt in seated dressing tasks with MOD/MAX A to don socks using cross-leg technique (pt appears primarily limited by heaviness/LE edema). Pt requires MIN A/CGA to CTS from elevated EOB to (~4-5") and requires MIN/MOD A for STS from standard bed height x2 trials d/t quad/hamstring weakness. Pt able to perform fxl mobility for [facility] distance with bari RW and cues for safe use/sequencing. Seated EOB, OT engages pt in seated red theraband resistance exercise (listed below) for UE Strengthening as it pertains to safety with ADL transfers. Will continue to follow acutely. Continue to anticipate that pt will require CIR f/u to ensure pt reaches highest attainable level of safety with self care ADLs and fxl mobility.    Follow Up Recommendations  CIR    Equipment Recommendations  Other (comment) (defer to next level of care)    Recommendations for Other Services      Precautions / Restrictions Precautions Precautions: Fall Precaution Comments: ostomy bag, JP drain,abd incision Restrictions Weight Bearing Restrictions: No       Mobility Bed Mobility Overal bed mobility: Needs Assistance Bed Mobility: Supine to Sit     Supine to sit: HOB elevated;Min assist     General bed mobility comments: increased time     Transfers Overall transfer level: Needs assistance Equipment used: Rolling walker (2 wheeled) Transfers: Sit to/from Stand Sit to Stand: Min assist;Mod assist         General transfer comment: CGA/MIN A for ADL transfers from elevated surface, MIN/MOD A from standard height bed.    Balance Overall balance assessment: Needs assistance Sitting-balance support: Feet supported;Bilateral upper extremity supported Sitting balance-Leahy Scale: Good     Standing balance support: Bilateral upper extremity supported;During functional activity Standing balance-Leahy Scale: Fair Standing balance comment: requires UE Support and CGA to sustain balance for static standing and fxl mobility, MIN A for pivoting/turning                           ADL either performed or assessed with clinical judgement   ADL Overall ADL's : Needs assistance/impaired                     Lower Body Dressing: Moderate assistance;Maximal assistance;Sitting/lateral leans Lower Body Dressing Details (indicate cue type and reason): to don socks in sitting             Functional mobility during ADLs: Min guard;Minimal assistance;Cueing for sequencing;Rolling walker (for fxl mobility ~30' for fxl HH distances)       Vision Baseline Vision/History: No visual deficits     Perception     Praxis      Cognition Arousal/Alertness: Awake/alert Behavior During Therapy: WFL for tasks assessed/performed Overall Cognitive Status: Within Functional Limits for tasks assessed  General Comments: pt is A and O and very motivated        Exercises Other Exercises Other Exercises: OT engages pt in seated red exercise band strengthening, horizontal abd x10, FWD punch x10 per arm, tricep pull down x10 per arm.   Shoulder Instructions       General Comments      Pertinent Vitals/ Pain       Pain Assessment: No/denies pain  Home Living                                           Prior Functioning/Environment              Frequency  Min 3X/week        Progress Toward Goals  OT Goals(current goals can now be found in the care plan section)  Progress towards OT goals: Progressing toward goals  Acute Rehab OT Goals Patient Stated Goal: I want to go to rehab to get stronger OT Goal Formulation: With patient Time For Goal Achievement: 07/05/21 Potential to Achieve Goals: Good  Plan Discharge plan remains appropriate;Frequency remains appropriate    Co-evaluation                 AM-PAC OT "6 Clicks" Daily Activity     Outcome Measure   Help from another person eating meals?: None Help from another person taking care of personal grooming?: A Little Help from another person toileting, which includes using toliet, bedpan, or urinal?: A Lot Help from another person bathing (including washing, rinsing, drying)?: A Lot Help from another person to put on and taking off regular upper body clothing?: A Little Help from another person to put on and taking off regular lower body clothing?: A Lot 6 Click Score: 16    End of Session Equipment Utilized During Treatment: Rolling walker  OT Visit Diagnosis: Unsteadiness on feet (R26.81);Muscle weakness (generalized) (M62.81)   Activity Tolerance Patient limited by fatigue   Patient Left in bed;with call bell/phone within reach;Other (comment) (eated EOB With CNA presenting to take VS)   Nurse Communication Mobility status        Time: [id number] OT Time Calculation (min): 24 min  Charges: OT General Charges $OT Visit: 1 Visit OT Treatments $Therapeutic Activity: 8-22 mins $Therapeutic Exercise: 8-22 mins  Gerrianne Scale, [place], OTR/L ascom [phone] 06/24/21, 4:05 PM

## 2021-06-24 NOTE — Progress Notes (Signed)
Inpatient Rehabilitation Admissions Coordinator   CIR bed is not available for his admission today. I spoke with patient by phone, alerted acute team and TOC. We are hopeful for bed tomorrow.  Danne Baxter, RN, MSN Rehab Admissions Coordinator [phone] 06/24/2021 10:14 AM

## 2021-06-24 NOTE — Progress Notes (Signed)
PHARMACIST - PHYSICIAN COMMUNICATION  DR:   Si Raider  CONCERNING: IV to Oral Route Change Policy  RECOMMENDATION: This patient is receiving PANTOPRAZOLE by the intravenous route.  Based on criteria approved by the Pharmacy and Therapeutics Committee, the intravenous medication(s) is/are being converted to the equivalent oral dose form(s).   DESCRIPTION: These criteria include: The patient is eating (either orally or via tube) and/or has been taking other orally administered medications for a least 24 hours The patient has no evidence of active gastrointestinal bleeding or impaired GI absorption (gastrectomy, short bowel, patient on TNA or NPO).  If you have questions about this conversion, please contact the Pharmacy Department  '[]'$   [phone] )  [facility] '[x]'$   [phone] )  [facility] '[]'$   [phone] )  [facility] '[]'$   [phone] )  [facility] '[]'$   [phone] )  [place] Rodriguez-Guzman PharmD, BCPS 06/24/2021 2:03 PM

## 2021-06-24 NOTE — Progress Notes (Signed)
[facility] Cardiology    SUBJECTIVE: Patient states to be doing reasonably well feels better today ready for discharge   Vitals:   06/23/21 2121 06/24/21 0324 06/24/21 0733 06/24/21 1120  BP: 118/68 132/72 138/77 127/66  Pulse: 82 75 76 91  Resp: '17 20 20 18  '$ Temp: 98.6 F (37 C) 98 F (36.7 C) 98.8 F (37.1 C) 98.5 F (36.9 C)  TempSrc:  Oral Oral Oral  SpO2: 98% 99% 100% 100%  Weight:  109.5 kg    Height:         Intake/Output Summary (Last 24 hours) at 06/24/2021 1357 Last data filed at 06/24/2021 1101 Gross per 24 hour  Intake 760.1 ml  Output 3230 ml  Net -2469.9 ml      PHYSICAL EXAM  General: Well developed, well nourished, in no acute distress HEENT:  Normocephalic and atramatic Neck:  No JVD.  Lungs: Clear bilaterally to auscultation and percussion. Heart: HRRR . Normal S1 and S2 without gallops or murmurs.  Abdomen: Bowel sounds are positive, abdomen soft and non-tender  Msk:  Back normal, normal gait. Normal strength and tone for age. Extremities: No clubbing, cyanosis or edema.   Neuro: Alert and oriented X 3. Psych:  Good affect, responds appropriately   LABS: Basic Metabolic Panel: Recent Labs    06/23/21 0425 06/24/21 0500  NA 134* 135  K 3.4* 3.9  CL 102 103  CO2 25 26  GLUCOSE 185* 154*  BUN 7 6  CREATININE 0.71 0.66  CALCIUM 7.0* 7.1*  MG 1.5* 1.7  PHOS 2.1* 2.7   Liver Function Tests: No results for input(s): AST, ALT, ALKPHOS, BILITOT, PROT, ALBUMIN in the last 72 hours. No results for input(s): LIPASE, AMYLASE in the last 72 hours. CBC: Recent Labs    06/22/21 0455 06/24/21 0500  WBC 12.9* 14.0*  NEUTROABS 8.8* 9.6*  HGB 10.5* 9.7*  HCT 29.8* 28.2*  MCV 96.1 97.2  PLT 330 443*   Cardiac Enzymes: No results for input(s): CKTOTAL, CKMB, CKMBINDEX, TROPONINI in the last 72 hours. BNP: Invalid input(s): POCBNP D-Dimer: No results for input(s): DDIMER in the last 72 hours. Hemoglobin A1C: No results for input(s): HGBA1C in  the last 72 hours. Fasting Lipid Panel: No results for input(s): CHOL, HDL, LDLCALC, TRIG, CHOLHDL, LDLDIRECT in the last 72 hours. Thyroid Function Tests: No results for input(s): TSH, T4TOTAL, T3FREE, THYROIDAB in the last 72 hours.  Invalid input(s): FREET3 Anemia Panel: Recent Labs    06/23/21 0425 06/23/21 1257  VITAMINB12  --  738  TIBC 115*  --   IRON 60  --     No results found.   Echo normal left ventricular function  TELEMETRY: A. fib 70:  ASSESSMENT AND PLAN:  Principal Problem:   Bowel perforation (HCC) Active Problems:   Essential hypertension   Hypocalcemia   Hypomagnesemia   Alcohol use disorder, moderate, dependence (HCC)   Type 1 diabetes mellitus with hyperglycemia (HCC)   AF (paroxysmal atrial fibrillation) (HCC)   Terminal ileitis ([place])   Hypotension    Plan Ready for discharge back to rehab facility Will reduce amiodarone to 200 mg a day Anticoagulation with Eliquis to be continued patient management of A. fib Continue diltiazem for rate control Continue diabetes management and control Advised the patient refrain from alcohol abuse Follow-up with surgery post resection and colostomy   Yolonda Kida, MD 06/24/2021 1:57 PM

## 2021-06-24 NOTE — Progress Notes (Signed)
Patient ID: Russell Gomez, male   DOB: 04-Oct-1961, 60 y.o.   MRN: [id number]     [facility] Hospital Day(s): 10.   Interval History: Patient seen and examined, no acute events or new complaints overnight. Patient reports feeling well.  Patient tolerated modified carb diet.  Ostomy output decreased to adequate level.  Slowly getting more pasty.  No nausea or vomiting.  No significant pain.  Vital signs in last 24 hours: [min-max] current  Temp:  [98 F (36.7 C)-98.8 F (37.1 C)] 98.8 F (37.1 C) (08/17 0733) Pulse Rate:  [66-88] 76 (08/17 0733) Resp:  [17-20] 20 (08/17 0733) BP: (118-138)/(62-85) 138/77 (08/17 0733) SpO2:  [98 %-100 %] 100 % (08/17 0733) Weight:  [109.5 kg] 109.5 kg (08/17 0324)     Height: '6\' 2"'$  (188 cm) Weight: 109.5 kg BMI (Calculated): 30.98   Physical Exam:  Constitutional: alert, cooperative and no distress  Respiratory: breathing non-labored at rest  Cardiovascular: regular rate and sinus rhythm  Gastrointestinal: soft, non-tender, and non-distended.  Colostomy pink and patent.  Wound dry without sign of deep fluid collection.  Labs:  CBC Latest Ref Rng & Units 06/24/2021 06/22/2021 06/21/2021  WBC 4.0 - 10.5 K/uL 14.0(H) 12.9(H) 13.9(H)  Hemoglobin 13.0 - 17.0 g/dL 9.7(L) 10.5(L) 9.7(L)  Hematocrit 39.0 - 52.0 % 28.2(L) 29.8(L) 27.5(L)  Platelets 150 - 400 K/uL 443(H) 330 282   CMP Latest Ref Rng & Units 06/24/2021 06/23/2021 06/22/2021  Glucose 70 - 99 mg/dL 154(H) 185(H) 131(H)  BUN 6 - 20 mg/dL '6 7 7  '$ Creatinine 0.61 - 1.24 mg/dL 0.66 0.71 0.62  Sodium 135 - 145 mmol/L 135 134(L) 135  Potassium 3.5 - 5.1 mmol/L 3.9 3.4(L) 2.9(L)  Chloride 98 - 111 mmol/L 103 102 103  CO2 22 - 32 mmol/L '26 25 26  '$ Calcium 8.9 - 10.3 mg/dL 7.1(L) 7.0(L) 7.2(L)  Total Protein 6.5 - 8.1 g/dL - - -  Total Bilirubin 0.3 - 1.2 mg/dL - - -  Alkaline Phos 38 - 126 U/L - - -  AST 15 - 41 U/L - - -  ALT 0 - 44 U/L - - -    Imaging studies: No new pertinent imaging  studies   Assessment/Plan:  60 y.o. male with diverticulitis with perforation 10 Day Post-Op s/p Hartman's procedure, complicated by pertinent comorbidities including type 1 diabetes, A. fib and on anticoagulation, chronic alcohol abuse.  Patient recovering adequately from surgical standpoint.  Tolerating regular diet.  Adequate ostomy output.  No sign of infection at this moment.  I agree with recommendation of inpatient rehab.  No contraindication for transfer to inpatient rehab from my standpoint.  We will continue to follow while he is inpatient here.  I will follow in my office in a week for wound follow-up.  Arnold Long, MD

## 2021-06-25 ENCOUNTER — Inpatient Hospital Stay (HOSPITAL_COMMUNITY)
Admission: RE | Admit: 2021-06-25 | Discharge: 2021-07-01 | DRG: 945 | Disposition: A | Discharge status: Home / Self Care | Payer: BC Managed Care – PPO | Source: Other Acute Inpatient Hospital | Attending: Physical Medicine and Rehabilitation | Admitting: Physical Medicine and Rehabilitation

## 2021-06-25 ENCOUNTER — Other Ambulatory Visit: Payer: Self-pay

## 2021-06-25 ENCOUNTER — Encounter (HOSPITAL_COMMUNITY): Payer: Self-pay | Admitting: Physical Medicine and Rehabilitation

## 2021-06-25 DIAGNOSIS — Z7901 Long term (current) use of anticoagulants: Secondary | ICD-10-CM

## 2021-06-25 DIAGNOSIS — E876 Hypokalemia: Secondary | ICD-10-CM | POA: Diagnosis not present

## 2021-06-25 DIAGNOSIS — Z888 Allergy status to other drugs, medicaments and biological substances status: Secondary | ICD-10-CM

## 2021-06-25 DIAGNOSIS — E109 Type 1 diabetes mellitus without complications: Secondary | ICD-10-CM | POA: Diagnosis not present

## 2021-06-25 DIAGNOSIS — D62 Acute posthemorrhagic anemia: Secondary | ICD-10-CM | POA: Diagnosis present

## 2021-06-25 DIAGNOSIS — I10 Essential (primary) hypertension: Secondary | ICD-10-CM | POA: Diagnosis present

## 2021-06-25 DIAGNOSIS — Z8249 Family history of ischemic heart disease and other diseases of the circulatory system: Secondary | ICD-10-CM

## 2021-06-25 DIAGNOSIS — Z9049 Acquired absence of other specified parts of digestive tract: Secondary | ICD-10-CM

## 2021-06-25 DIAGNOSIS — Z83438 Family history of other disorder of lipoprotein metabolism and other lipidemia: Secondary | ICD-10-CM

## 2021-06-25 DIAGNOSIS — Z933 Colostomy status: Secondary | ICD-10-CM

## 2021-06-25 DIAGNOSIS — Z885 Allergy status to narcotic agent status: Secondary | ICD-10-CM | POA: Diagnosis not present

## 2021-06-25 DIAGNOSIS — E7800 Pure hypercholesterolemia, unspecified: Secondary | ICD-10-CM | POA: Diagnosis not present

## 2021-06-25 DIAGNOSIS — E049 Nontoxic goiter, unspecified: Secondary | ICD-10-CM | POA: Diagnosis present

## 2021-06-25 DIAGNOSIS — K219 Gastro-esophageal reflux disease without esophagitis: Secondary | ICD-10-CM | POA: Diagnosis present

## 2021-06-25 DIAGNOSIS — E559 Vitamin D deficiency, unspecified: Secondary | ICD-10-CM | POA: Diagnosis present

## 2021-06-25 DIAGNOSIS — F17210 Nicotine dependence, cigarettes, uncomplicated: Secondary | ICD-10-CM | POA: Diagnosis not present

## 2021-06-25 DIAGNOSIS — Z79899 Other long term (current) drug therapy: Secondary | ICD-10-CM

## 2021-06-25 DIAGNOSIS — K631 Perforation of intestine (nontraumatic): Secondary | ICD-10-CM | POA: Diagnosis not present

## 2021-06-25 DIAGNOSIS — F10231 Alcohol dependence with withdrawal delirium: Secondary | ICD-10-CM | POA: Diagnosis not present

## 2021-06-25 DIAGNOSIS — E612 Magnesium deficiency: Secondary | ICD-10-CM | POA: Diagnosis not present

## 2021-06-25 DIAGNOSIS — Z981 Arthrodesis status: Secondary | ICD-10-CM

## 2021-06-25 DIAGNOSIS — Z794 Long term (current) use of insulin: Secondary | ICD-10-CM

## 2021-06-25 DIAGNOSIS — Z79891 Long term (current) use of opiate analgesic: Secondary | ICD-10-CM

## 2021-06-25 DIAGNOSIS — F419 Anxiety disorder, unspecified: Secondary | ICD-10-CM | POA: Diagnosis not present

## 2021-06-25 DIAGNOSIS — R5381 Other malaise: Principal | ICD-10-CM | POA: Diagnosis present

## 2021-06-25 DIAGNOSIS — J690 Pneumonitis due to inhalation of food and vomit: Secondary | ICD-10-CM | POA: Diagnosis not present

## 2021-06-25 DIAGNOSIS — M7989 Other specified soft tissue disorders: Secondary | ICD-10-CM | POA: Diagnosis not present

## 2021-06-25 DIAGNOSIS — I480 Paroxysmal atrial fibrillation: Secondary | ICD-10-CM | POA: Diagnosis not present

## 2021-06-25 DIAGNOSIS — Z833 Family history of diabetes mellitus: Secondary | ICD-10-CM

## 2021-06-25 DIAGNOSIS — Z7141 Alcohol abuse counseling and surveillance of alcoholic: Secondary | ICD-10-CM

## 2021-06-25 DIAGNOSIS — Z716 Tobacco abuse counseling: Secondary | ICD-10-CM

## 2021-06-25 LAB — GLUCOSE, CAPILLARY
Glucose-Capillary: 334 mg/dL — ABNORMAL HIGH (ref 70–99)
Glucose-Capillary: 219 mg/dL — ABNORMAL HIGH (ref 70–99)
Glucose-Capillary: 379 mg/dL — ABNORMAL HIGH (ref 70–99)
Glucose-Capillary: 285 mg/dL — ABNORMAL HIGH (ref 70–99)

## 2021-06-25 LAB — BASIC METABOLIC PANEL
Anion gap: 5 (ref 5–15)
BUN: 9 mg/dL (ref 6–20)
CO2: 27 mmol/L (ref 22–32)
Calcium: 7.2 mg/dL — ABNORMAL LOW (ref 8.9–10.3)
Chloride: 102 mmol/L (ref 98–111)
Creatinine, Ser: 0.64 mg/dL (ref 0.61–1.24)
GFR, Estimated: >60 mL/min (ref >60)
Glucose, Bld: 368 mg/dL — ABNORMAL HIGH (ref 70–99)
Potassium: 4 mmol/L (ref 3.5–5.1)
Sodium: 134 mmol/L — ABNORMAL LOW (ref 135–145)

## 2021-06-25 MED ORDER — INSULIN ASPART 100 UNIT/ML IJ SOLN: 0.0000 [IU] | Freq: Every day | INTRAMUSCULAR | 11 refills | Status: DC

## 2021-06-25 MED ORDER — POLYETHYLENE GLYCOL 3350 17 G PO PACK
17.0000 g | PACK | Freq: Every day | ORAL | Status: DC
  Administered 2021-06-26 – 2021-06-27 (×2): 17 g via ORAL
  Filled 2021-06-25 (×3): qty 1

## 2021-06-25 MED ORDER — DIAZEPAM 5 MG PO TABS
2.5000 mg | ORAL_TABLET | Freq: Two times a day (BID) | ORAL | Status: DC
  Administered 2021-06-25 – 2021-06-29 (×8): 2.5 mg via ORAL
  Filled 2021-06-25 (×8): qty 1

## 2021-06-25 MED ORDER — ACETAMINOPHEN 650 MG RE SUPP: 650.0000 mg | Freq: Four times a day (QID) | RECTAL | Status: DC | PRN

## 2021-06-25 MED ORDER — INSULIN GLARGINE-YFGN 100 UNIT/ML ~~LOC~~ SOLN
15.0000 [IU] | Freq: Every day | SUBCUTANEOUS | Status: DC
  Administered 2021-06-25: 15 [IU] via SUBCUTANEOUS
  Filled 2021-06-25 (×2): qty 0.15

## 2021-06-25 MED ORDER — DIAZEPAM 5 MG PO TABS: 2.5000 mg | ORAL_TABLET | Freq: Two times a day (BID) | ORAL | 0 refills | Status: DC

## 2021-06-25 MED ORDER — AMIODARONE HCL 200 MG PO TABS: 200.0000 mg | ORAL_TABLET | Freq: Every day | ORAL | Status: DC

## 2021-06-25 MED ORDER — DILTIAZEM HCL ER COATED BEADS 180 MG PO CP24: 180.0000 mg | ORAL_CAPSULE | Freq: Every day | ORAL | Status: DC

## 2021-06-25 MED ORDER — NAPHAZOLINE-GLYCERIN 0.012-0.25 % OP SOLN: 1.0000 [drp] | Freq: Four times a day (QID) | OPHTHALMIC | Status: DC | PRN

## 2021-06-25 MED ORDER — ADULT MULTIVITAMIN W/MINERALS CH
1.0000 | ORAL_TABLET | Freq: Every day | ORAL | Status: DC
  Administered 2021-06-26 – 2021-07-01 (×6): 1 via ORAL
  Filled 2021-06-25 (×6): qty 1

## 2021-06-25 MED ORDER — NICOTINE 21 MG/24HR TD PT24
21.0000 mg | MEDICATED_PATCH | Freq: Every day | TRANSDERMAL | Status: DC
  Administered 2021-06-26 – 2021-06-30 (×5): 21 mg via TRANSDERMAL
  Filled 2021-06-25 (×7): qty 1

## 2021-06-25 MED ORDER — INSULIN GLARGINE-YFGN 100 UNIT/ML ~~LOC~~ SOLN: 15.0000 [IU] | Freq: Every day | SUBCUTANEOUS | 11 refills | Status: DC

## 2021-06-25 MED ORDER — FOLIC ACID 1 MG PO TABS
1.0000 mg | ORAL_TABLET | Freq: Every day | ORAL | Status: DC
  Administered 2021-06-26 – 2021-07-01 (×6): 1 mg via ORAL
  Filled 2021-06-25 (×7): qty 1

## 2021-06-25 MED ORDER — PANTOPRAZOLE SODIUM 40 MG PO TBEC
40.0000 mg | DELAYED_RELEASE_TABLET | Freq: Every day | ORAL | Status: DC
  Administered 2021-06-25 – 2021-06-30 (×6): 40 mg via ORAL
  Filled 2021-06-25 (×6): qty 1

## 2021-06-25 MED ORDER — DILTIAZEM HCL ER COATED BEADS 180 MG PO CP24
180.0000 mg | ORAL_CAPSULE | Freq: Every day | ORAL | Status: DC
  Administered 2021-06-26 – 2021-07-01 (×6): 180 mg via ORAL
  Filled 2021-06-25 (×6): qty 1

## 2021-06-25 MED ORDER — POLYVINYL ALCOHOL 1.4 % OP SOLN
1.0000 [drp] | Freq: Four times a day (QID) | OPHTHALMIC | Status: DC | PRN
  Filled 2021-06-25: qty 15

## 2021-06-25 MED ORDER — GLUCERNA SHAKE PO LIQD
237.0000 mL | Freq: Two times a day (BID) | ORAL | Status: DC
  Administered 2021-06-25 – 2021-06-26 (×2): 237 mL via ORAL

## 2021-06-25 MED ORDER — NICOTINE 21 MG/24HR TD PT24: 21.0000 mg | MEDICATED_PATCH | Freq: Every day | TRANSDERMAL | 0 refills | Status: DC

## 2021-06-25 MED ORDER — FUROSEMIDE 20 MG PO TABS
20.0000 mg | ORAL_TABLET | Freq: Every day | ORAL | Status: DC
  Administered 2021-06-26: 20 mg via ORAL
  Filled 2021-06-25: qty 1

## 2021-06-25 MED ORDER — APIXABAN 5 MG PO TABS
5.0000 mg | ORAL_TABLET | Freq: Two times a day (BID) | ORAL | Status: DC
  Administered 2021-06-25 – 2021-07-01 (×12): 5 mg via ORAL
  Filled 2021-06-25 (×10): qty 1
  Filled 2021-06-25: qty 2
  Filled 2021-06-25: qty 1

## 2021-06-25 MED ORDER — AMIODARONE HCL 200 MG PO TABS
200.0000 mg | ORAL_TABLET | Freq: Every day | ORAL | Status: DC
  Administered 2021-06-26 – 2021-07-01 (×6): 200 mg via ORAL
  Filled 2021-06-25 (×6): qty 1

## 2021-06-25 MED ORDER — FUROSEMIDE 20 MG PO TABS: 20.0000 mg | ORAL_TABLET | Freq: Every day | ORAL | Status: DC

## 2021-06-25 MED ORDER — ACETAMINOPHEN 325 MG PO TABS
650.0000 mg | ORAL_TABLET | Freq: Four times a day (QID) | ORAL | Status: DC | PRN
  Administered 2021-06-30: 650 mg via ORAL
  Filled 2021-06-25: qty 2

## 2021-06-25 MED ORDER — INSULIN ASPART 100 UNIT/ML IJ SOLN
0.0000 [IU] | Freq: Three times a day (TID) | INTRAMUSCULAR | Status: DC
Start: 2021-06-25 — End: 2021-06-28
  Administered 2021-06-25: 9 [IU] via SUBCUTANEOUS
  Administered 2021-06-26: 7 [IU] via SUBCUTANEOUS
  Administered 2021-06-26 – 2021-06-27 (×3): 5 [IU] via SUBCUTANEOUS
  Administered 2021-06-27: 2 [IU] via SUBCUTANEOUS
  Administered 2021-06-27: 7 [IU] via SUBCUTANEOUS

## 2021-06-25 MED ORDER — INSULIN ASPART 100 UNIT/ML IJ SOLN: 0.0000 [IU] | Freq: Three times a day (TID) | INTRAMUSCULAR | 11 refills | Status: DC

## 2021-06-25 NOTE — Progress Notes (Signed)
Patient ID: Russell Gomez, male   DOB: January 01, 1961, 60 y.o.   MRN: [id number] Met with the patient to introduce self and the role of the nurse CM. Discussed debility p colon surgery and ostomy placement. Patient with staples to abd and colostomy 2 piece/barrier ring left UQ. Small amount of serous drainage mid incision line w redness and yellow slough noted at dehiscence and slight drainage at bottom of incision. Patient with severe edema of upper and lower ext/feet, weeping right UE. Condom removed, urinal at bedside. Reviewed medications and treatment for DM (A1C 8.2) w CMM diet and HLD (total 207/Trig 180) and HTN. Also reviewed smoking and ETOH cessation and increased protein. Continue to follow along to discharge to address educational needs. Margarito Liner

## 2021-06-25 NOTE — Discharge Summary (Signed)
Russell Gomez [id number] DOB: 27-Aug-1961 DOA: 06/14/2021  PCP: Ria Bush, MD  Admit date: 06/14/2021 Discharge date: 06/25/2021  Time spent: 35 minutes  Recommendations for Outpatient Follow-up:  General surgery follow-up 1 week, will need staples removed from abdominal incision at some point  Outpatient cardiology f/u    Discharge Diagnoses:  Principal Problem:   Bowel perforation ([place]) Active Problems:   Essential hypertension   Hypocalcemia   Hypomagnesemia   Alcohol use disorder, moderate, dependence (H[facility])   Type 1 diabetes mellitus with hyperglycemia ([facility])   AF (paroxysmal atrial fibrillation) ([place])   Terminal ileitis ([place])   Hypotension   Discharge Condition: stable  Diet recommendation: carb modified  Filed Weights   06/23/21 0401 06/24/21 0324 06/25/21 0500  Weight: 112.5 kg 109.5 kg 109.6 kg    History of present illness:  60 yo male who [facility] to [facility] ER on 08/7 with a 3 day hx of lower abdominal pain.  He does have a hx of chronic ETOH abuse, however reported his last drink was 08/4.  CT Abd Pelvis revealed diverticular disease with perforation.  Therefore, General Surgery consulted and pt underwent emergent sigmoid colon resection with colostomy (Hartmann's procedure) and appendectomy.  He was subsequently admitted to the stepdown unit postop for additional workup and treatment.  However, on 08/9 hospital course complicated by severe ETOH withdrawal symptoms despite administration of ativan per CIWA protocol.  Therefore, precedex gtt ordered and PCCM team consulted to assist with plan of care. Patient was transferred to medical floor on 8/11.  Hospital Course:   Diverticulosis with perforation. Status post a Hartmann procedure.  Patient doing well, bowel has recovered.  s/p abx. JP drain removed 8/17. Needs ongoing ostomy care. F/u gen surg 1 week.   Paroxysmal atrial fibrillation with rapid major response. Hypotension secondary to  tachycardia. Patient develop paroxysmal atrial fibrillation postop.  Was placed on Eliquis, amiodarone drip and diltiazem. Cardiology followed. D/c on amiodarone and eliquis and dlitiazem. Will need outpatient cardiology f/u   Alcohol withdrawal with delirium tremens. Resolved. Currently receiving diazepam scheduled q12, should continue to taper  Aspiration pneumonia secondary to alcohol withdrawal. Perforated colonic ulcer Treated   Type 1 diabetes uncontrolled with hyperglycemia and hypoglycemia. Cont lantus, SSI. Labile glucose while inpatient  Severe hypoalbuminemia. Continue protein supplements.  Procedures: Sigmoid colon resection w/ colostomy (hartmann's procedure) with appendectomy  Consultations: General surgery  Discharge Exam: Vitals:   06/25/21 0418 06/25/21 0728  BP: 130/66 133/74  Pulse: 89 85  Resp: 18 18  Temp: 98 F (36.7 C) 98.8 F (37.1 C)  SpO2: 98% 97%    General exam: Appears calm and comfortable  Respiratory system: Clear to auscultation. Respiratory effort normal. Cardiovascular system: S1 & S2 heard, RRR. No JVD, murmurs, rubs, gallops or clicks.  Gastrointestinal system: Abdomen is nondistended, soft and nontender. No organomegaly or masses felt. Normal bowel sounds heard. Stapled midline incision, ostomy with output Central nervous system: Alert and oriented. No focal neurological deficits. Extremities: 2-3+ leg edema Skin: No rashes, lesions or ulcers Psychiatry: Judgement and insight appear normal. Mood & affect appropriate.   Discharge Instructions   Discharge Instructions     Diet - low sodium heart healthy   Complete by: As directed    Discharge wound care:   Complete by: As directed    Local wound care   Increase activity slowly   Complete by: As directed       Allergies as of 06/25/2021  Reactions   Insulin Aspart Other (See Comments)   CELLULITIS   Spironolactone Nausea Only, Rash   Hydrochlorothiazide Itching,  Rash   Morphine Other (See Comments)   "MAKES ME MEAN"        Medication List     STOP taking these medications    insulin detemir 100 UNIT/ML FlexPen Commonly known as: LEVEMIR   insulin regular 100 units/mL injection Commonly known as: NOVOLIN R   metoprolol tartrate 100 MG tablet Commonly known as: LOPRESSOR       TAKE these medications    amiodarone 200 MG tablet Commonly known as: PACERONE Take 1 tablet (200 mg total) by mouth daily. Start taking on: June 26, 2021   apixaban 5 MG Tabs tablet Commonly known as: ELIQUIS Take 1 tablet (5 mg total) by mouth 2 (two) times daily.   diazepam 5 MG tablet Commonly known as: VALIUM Take 0.5 tablets (2.5 mg total) by mouth every 12 (twelve) hours.   diltiazem 180 MG 24 hr capsule Commonly known as: CARDIZEM CD Take 1 capsule (180 mg total) by mouth daily. Start taking on: June 26, 2021   feeding supplement (GLUCERNA SHAKE) Liqd Take 237 mLs by mouth 3 (three) times daily between meals.   folic acid 1 MG tablet Commonly known as: FOLVITE Take 1 tablet by mouth once daily   furosemide 20 MG tablet Commonly known as: LASIX Take 1 tablet (20 mg total) by mouth daily. Start taking on: June 26, 2021 What changed:  medication strength how much to take   glucose blood test strip Use 6 (six) times daily   hydrOXYzine 25 MG tablet Commonly known as: ATARAX/VISTARIL Take 0.5-1 tablets (12.5-25 mg total) by mouth 2 (two) times daily as needed for anxiety or nausea (sedation precautions).   insulin aspart 100 UNIT/ML injection Commonly known as: novoLOG Inject 0-5 Units into the skin at bedtime.   insulin aspart 100 UNIT/ML injection Commonly known as: novoLOG Inject 0-9 Units into the skin 3 (three) times daily with meals.   insulin glargine-yfgn 100 UNIT/ML injection Commonly known as: SEMGLEE Inject 0.15 mLs (15 Units total) into the skin at bedtime.   Insulin Pen Needle 34G X 3.5 MM Misc 1 Dose by  Does not apply route daily.   magnesium oxide 400 (240 Mg) MG tablet Commonly known as: MAG-OX Take 1 tablet (400 mg total) by mouth daily.   multivitamin with minerals Tabs tablet Take 1 tablet by mouth daily.   nicotine 21 mg/24hr patch Commonly known as: NICODERM CQ - dosed in mg/24 hours Place 1 patch (21 mg total) onto the skin daily. Start taking on: June 26, 2021   Oscal 500/200 D-3 500-200 MG-UNIT tablet Generic drug: calcium-vitamin D Take 1 tablet by mouth 2 (two) times daily.   pantoprazole 40 MG tablet Commonly known as: PROTONIX Take 1 tablet (40 mg total) by mouth 2 (two) times daily before a meal.   potassium chloride SA 20 MEQ tablet Commonly known as: Klor-Con M20 Take 1 tablet (20 mEq total) by mouth daily.   pravastatin 80 MG tablet Commonly known as: PRAVACHOL Take 1 tablet by mouth once daily   thiamine 100 MG tablet Take 1 tablet (100 mg total) by mouth daily.   traZODone 50 MG tablet Commonly known as: DESYREL Take 1 tablet (50 mg total) by mouth at bedtime.               Discharge Care Instructions  (From admission, onward)  Start     Ordered   06/25/21 0000  Discharge wound care:       Comments: Local wound care   06/25/21 1023           Allergies  Allergen Reactions   Insulin Aspart Other (See Comments)    CELLULITIS   Spironolactone Nausea Only and Rash   Hydrochlorothiazide Itching and Rash   Morphine Other (See Comments)    "MAKES ME MEAN"    Follow-up Information     Herbert Pun, MD Follow up in 1 week(s).   Specialty: General Surgery Why: For follow up after colostomy Contact information: [place] [id number] [phone]                  The results of significant diagnostics from this hospitalization (including imaging, microbiology, ancillary and laboratory) are listed below for reference.    Significant Diagnostic Studies: CT ABDOMEN PELVIS W  CONTRAST  Result Date: 06/14/2021 CLINICAL DATA:  Nausea and vomiting EXAM: CT ABDOMEN AND PELVIS WITH CONTRAST TECHNIQUE: Multidetector CT imaging of the abdomen and pelvis was performed using the standard protocol following bolus administration of intravenous contrast. CONTRAST:  148m OMNIPAQUE IOHEXOL 350 MG/ML SOLN COMPARISON:  None. FINDINGS: LOWER CHEST: Normal. HEPATOBILIARY: Diffuse hypoattenuation of the liver relative to the spleen suggests hepatic steatosis. No focal liver lesion or biliary dilatation. The gallbladder is normal. PANCREAS: Normal pancreas. No ductal dilatation or peripancreatic fluid collection. SPLEEN: Normal. ADRENALS/URINARY TRACT: The adrenal glands are normal. No hydronephrosis, nephroureterolithiasis or solid renal mass. The urinary bladder is normal for degree of distention STOMACH/BOWEL: There is a punctate focus free air in the ventral abdomen (series 2, image 30). There is no hiatal hernia. Normal duodenal course and caliber. Small amount of free fluid interleaved between loops of small bowel. There is hyperenhancement of the wall of the terminal ileum. Rectosigmoid diverticulosis without acute inflammation. There is intramural fat deposition within the proximal ascending colon and the descending colon. Normal appendix. VASCULAR/LYMPHATIC: There is calcific atherosclerosis of the abdominal aorta. No lymphadenopathy. REPRODUCTIVE: Normal prostate size with symmetric seminal vesicles. MUSCULOSKELETAL. No bony spinal canal stenosis or focal osseous abnormality. OTHER: None. IMPRESSION: 1. Hyperenhancement of the wall of the terminal ileum with small amount of free fluid and punctate focus of free air in the ventral abdomen, suggesting acute Crohn's disease. 2. Multifocal punctate free air within the peritoneal cavity indicating perforation at an unknown location. 3. Submucosal fat deposition within the proximal ascending colon and descending colon, consistent with chronic  inflammatory bowel disease. Critical Value/emergent results were called by telephone at the time of interpretation on 06/14/2021 at 3:24 am [facility], who verbally acknowledged these results. Aortic Atherosclerosis (ICD10-I70.0). Electronically Signed   By: KUlyses JarredM.D.   On: 06/14/2021 03:24   DG Chest Port 1 View  Result Date: 06/16/2021 CLINICAL DATA:  60year old male with aspiration of gastric contents. EXAM: PORTABLE CHEST 1 VIEW COMPARISON:  Portable chest 12/26/2020 and earlier. FINDINGS: Portable AP upright view at 0435 hours. Lower lung volumes. Mediastinal contours remain normal. Visualized tracheal air column is within normal limits. Mild crowding of lung markings diffusely now. Otherwise Allowing for portable technique the lungs are clear. No confluent opacity. Visible bowel-gas pattern is within normal limits. No acute osseous abnormality identified. IMPRESSION: Low lung volumes.  No acute cardiopulmonary abnormality. Electronically Signed   By: HGenevie AnnM.D.   On: 06/16/2021 05:12   DG Abd 2 Views  Result Date: 06/16/2021 CLINICAL DATA:  Postoperative nausea and vomiting, abdominal pain, colon resection and appendectomy on 06/14/2021 EXAM: ABDOMEN - 2 VIEW COMPARISON:  CT abdomen and pelvis 06/14/2021 FINDINGS: Surgical drain in pelvis. Dilated a air-filled small bowel and to lesser degree colonic loops in the abdomen and pelvis consistent with postoperative ileus. No bowel wall thickening seen. Scattered atherosclerotic calcifications aorta. Bones demineralized with degenerative disc disease changes lumbar spine. IMPRESSION: Postoperative ileus. Electronically Signed   By: Lavonia Dana M.D.   On: 06/16/2021 11:19   [place] EKG SITE RITE  Result Date: 06/16/2021 If Site Rite image not attached, placement could not be confirmed due to current cardiac rhythm.  [place] EKG SITE RITE  Result Date: 06/16/2021 If Site Rite image not attached, placement could not be confirmed due to  current cardiac rhythm.   Microbiology: Recent Results (from the past 240 hour(s))  CULTURE, BLOOD (ROUTINE X 2) w Reflex to ID Panel     Status: None   Collection Time: 06/15/21 11:46 AM   Specimen: BLOOD  Result Value Ref Range Status   Specimen Description BLOOD BLOOD LEFT ARM  Final   Special Requests   Final    BOTTLES DRAWN AEROBIC AND ANAEROBIC Blood Culture adequate volume   Culture   Final    NO GROWTH 5 DAYS Performed at [facility], [address] [place], [place] [address]    Report Status 06/20/2021 FINAL  Final  CULTURE, BLOOD (ROUTINE X 2) w Reflex to ID Panel     Status: None   Collection Time: 06/15/21 11:46 AM   Specimen: BLOOD  Result Value Ref Range Status   Specimen Description BLOOD BLOOD RIGHT ARM  Final   Special Requests   Final    BOTTLES DRAWN AEROBIC AND ANAEROBIC Blood Culture results may not be optimal due to an inadequate volume of blood received in culture bottles   Culture   Final    NO GROWTH 5 DAYS Performed at [facility], [address] [place], [place] [address]    Report Status 06/20/2021 FINAL  Final     Labs: Basic Metabolic Panel: Recent Labs  Lab 06/20/21 0529 06/21/21 0530 06/22/21 0455 06/23/21 0425 06/24/21 0500 06/25/21 0410  NA  --  138 135 134* 135 134*  K  --  3.0* 2.9* 3.4* 3.9 4.0  CL  --  104 103 102 103 102  CO2  --  '26 26 25 26 27  '$ GLUCOSE  --  209* 131* 185* 154* 368*  BUN  --  '8 7 7 6 9  '$ CREATININE  --  0.64 0.62 0.71 0.66 0.64  CALCIUM  --  7.0* 7.2* 7.0* 7.1* 7.2*  MG 1.3* 1.6* 1.6* 1.5* 1.7  --   PHOS 2.5 3.0 2.1* 2.1* 2.7  --    Liver Function Tests: No results for input(s): AST, ALT, ALKPHOS, BILITOT, PROT, ALBUMIN in the last 168 hours. No results for input(s): LIPASE, AMYLASE in the last 168 hours. No results for input(s): AMMONIA in the last 168 hours. CBC: Recent Labs  Lab 06/19/21 0853 06/21/21 0530 06/22/21 0455 06/24/21 0500  WBC 18.2* 13.9* 12.9* 14.0*   NEUTROABS  --  9.3* 8.8* 9.6*  HGB 9.7* 9.7* 10.5* 9.7*  HCT 27.9* 27.5* 29.8* 28.2*  MCV 100.7* 95.8 96.1 97.2  PLT 249 282 330 443*   Cardiac Enzymes: No results for input(s): CKTOTAL, CKMB, CKMBINDEX, TROPONINI in the last 168 hours. BNP: BNP (last 3 results) No  results for input(s): BNP in the last 8760 hours.  ProBNP (last 3 results) No results for input(s): PROBNP in the last 8760 hours.  CBG: Recent Labs  Lab 06/24/21 0735 06/24/21 1122 06/24/21 1558 06/24/21 2020 06/25/21 0731  GLUCAP 105* 190* 213* 225* 334*       Signed:  Desma Maxim MD.  [facility] 06/25/2021, 10:23 AM

## 2021-06-25 NOTE — Progress Notes (Signed)
Show:Clear all '[x]'$ Written'[x]'$ Templated'[x]'$ Copied  Added by: '[x]'$ Cristina Gong, RN'[x]'$ Raulkar, Clide Deutscher, MD  '[]'$ Hover for details                                                                                                                                                                                                                                                                                                                                                                                                                                  PMR Admission Coordinator Pre-Admission Assessment   Patient: Russell Gomez is an 60 y.o., male MRN: [id number] DOB: Feb 01, 1961 Height: '6\' 2"'$  (188 cm) Weight: 109.6 kg   Insurance Information HMO:     PPO: yes     PCP:      IPA:      80/20:      OTHER:  PRIMARY: [employer] [place]      Policy#: [id number]      Subscriber: pt CM Name: Larene Beach      Phone#: [phone]     Fax#: [phone] Pre-Cert#: [id number] approved until 07/07/2021      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 Date: 10/08/2020 and TERMS 07/08/21     Deduct: $3000      Out of Pocket Max: $7000      Life Max: none CIR: 90%  SNF: 90% 60 days Outpatient: 90%     Co-Pay: 30 visits combined Home Health: 90%      Co-Pay: visits per medical neccesity DME: 90%     Co-Pay: 10% Providers: in network  SECONDARY: none   Financial Counselor:       Phone#:    The Engineer, [employer]" for patients in Inpatient Rehabilitation Facilities with attached "Privacy Act [place] Records" was provided and verbally reviewed with: N/A   Emergency Contact Information Contact Information       Name Relation Home Work Mobile    [place] Son     [phone]     [place], [place] [phone]   [phone]           Current Medical History  Patient Admitting Diagnosis: Debility   History of Present Illness:  60 year old right-handed male with history of alcohol /tobacco abuse, atrial fibrillation maintained on Eliquis followed by Dr. Clayborn Bigness, diabetes mellitus, hyperlipidemia.  Per chart review patient lives alone currently separated from his wife.  Presented 06/14/2021 to [facility] with nausea, vomiting, nonbloody diarrhea and blood sugars greater than 600 x 3 to 4 days.  Admission chemistry sodium 134 chloride 97 glucose 443, lipase 18, hemoglobin 14.5, WBC 11,600, lactic acid 4.5, urinalysis negative nitrite, magnesium 1 urine drug screen positive marijuana COVID PCR negative, C. difficile PCR negative.  CT abdomen pelvis showed hyperenhancement of the wall of the terminal ileum with small amount of free fluid and punctate focus of free air in the ventral abdomen as well as multifocal punctate free air within the peritoneal cavity indicating possible perforation.  General surgery consulted patient underwent sigmoid colon resection with colostomy 06/14/2021 per Dr.Cintron Ferrel Logan.  Hospital course complicated by acute blood loss anemia 10.5.  Bouts of hypokalemia supplement added.  Intermittent bouts of orthostasis maintained on ProAmatine.  Atrial fibrillation remains controlled maintained on Cardizem as well as amiodarone and his Eliquis was resumed postoperatively.  Bouts of agitation restlessness suspect alcohol withdrawal initially maintained on stepdown unit with CIWA protocol.  Aspiration pneumonia completing course of Augmentin and his diet has been advanced to regular consistency.     Patient's medical record from [facility] has been reviewed by the rehabilitation admission coordinator and physician.   Past Medical History      Past Medical History:  Diagnosis Date   Arthritis      on operative finger   Dental crowns present     Diabetes type 1, uncontrolled  ([place])      IDDM (Dr. Eddie Dibbles @ [facility])   Esophageal reflux     Goiter 04/2012    no current med, has yearly monitoring   High cholesterol     HTN (hypertension)      under control with meds, has been on med x 2 yr.      Family History   family history includes Coronary artery disease in his maternal uncle; Diabetes in his maternal uncle, paternal grandmother, and paternal uncle; Healthy in his father; Hyperlipidemia in his mother; Hypertension in his mother; Stroke in his maternal aunt.   Prior Rehab/Hospitalizations Has the patient had prior rehab or hospitalizations prior to admission? Yes   Has the patient had major surgery during 100 days prior to admission? Yes              Current Medications   Current Facility-Administered Medications:    acetaminophen (TYLENOL) tablet 650 mg, 650 mg, Oral, Q6H PRN, 650 mg at 06/21/21 1453 **OR** acetaminophen (TYLENOL) suppository 650  mg, 650 mg, Rectal, Q6H PRN, Herbert Pun, MD   amiodarone (PACERONE) tablet 200 mg, 200 mg, Oral, Daily, Callwood, Dwayne D, MD, 200 mg at 06/25/21 [id number]   apixaban (ELIQUIS) tablet 5 mg, 5 mg, Oral, BID, Sharen Hones, MD, 5 mg at 06/25/21 [id number]   Chlorhexidine Gluconate Cloth 2 % PADS 6 each, 6 each, Topical, Daily, Sharen Hones, MD, 6 each at 06/25/21 0830   dextrose 50 % solution 0-50 mL, 0-50 mL, Intravenous, PRN, Danford, Suann Larry, MD, 25 mL at 06/23/21 0405   diazepam (VALIUM) tablet 2.5 mg, 2.5 mg, Oral, Q12H, Wouk, Ailene Rud, MD, 2.5 mg at 06/25/21 [id number]   diltiazem (CARDIZEM CD) 24 hr capsule 180 mg, 180 mg, Oral, Daily, Sharen Hones, MD, 180 mg at 06/25/21 0831   feeding supplement (GLUCERNA SHAKE) (GLUCERNA SHAKE) liquid 237 mL, 237 mL, Oral, BID BM, Sharen Hones, MD, 237 mL at 06/25/21 0831   fentaNYL (SUBLIMAZE) injection 25 mcg, 25 mcg, Intravenous, Q4H PRN, Graves, Dana E, NP, 25 mcg at [id number]   folic acid (FOLVITE) tablet 1 mg, 1 mg, Oral, Daily, Cintron-Diaz, Edgardo, MD, 1 mg at  06/25/21 [id number]   furosemide (LASIX) tablet 20 mg, 20 mg, Oral, Daily, Callwood, Dwayne D, MD, 20 mg at 06/25/21 0828   insulin aspart (novoLOG) injection 0-5 Units, 0-5 Units, Subcutaneous, QHS, Sharen Hones, MD, 2 Units at 06/24/21 2204   insulin aspart (novoLOG) injection 0-9 Units, 0-9 Units, Subcutaneous, TID WC, Berta Minor, RPH, 7 Units at 06/25/21 0829   insulin glargine-yfgn (SEMGLEE) injection 15 Units, 15 Units, Subcutaneous, QHS, Sharen Hones, MD, 15 Units at 06/24/21 2204   multivitamin with minerals tablet 1 tablet, 1 tablet, Oral, Daily, Kasa, Maretta Bees, MD, 1 tablet at 06/25/21 [id number]   nicotine (NICODERM CQ - dosed in mg/24 hours) patch 21 mg, 21 mg, Transdermal, Daily, Cintron-Diaz, Reeves Forth, MD, 21 mg at 06/25/21 [id number]   [DISCONTINUED] ondansetron (ZOFRAN-ODT) disintegrating tablet 4 mg, 4 mg, Oral, Q6H PRN **OR** ondansetron (ZOFRAN) injection 4 mg, 4 mg, Intravenous, Q6H PRN, Windell Moment, Edgardo, MD, 4 mg at 06/20/21 0825   pantoprazole (PROTONIX) EC tablet 40 mg, 40 mg, Oral, QHS, Wouk, Ailene Rud, MD, 40 mg at 06/24/21 2205   polyethylene glycol (MIRALAX / GLYCOLAX) packet 17 g, 17 g, Oral, Daily, Cintron-Diaz, Edgardo, MD, 17 g at 06/25/21 0829   sodium chloride flush (NS) 0.9 % injection 10-40 mL, 10-40 mL, Intracatheter, Q12H, Cintron-Diaz, Edgardo, MD, 10 mL at 06/25/21 0831   sodium chloride flush (NS) 0.9 % injection 10-40 mL, 10-40 mL, Intracatheter, PRN, Herbert Pun, MD   Patients Current Diet:  Diet Order                  Diet Carb Modified Fluid consistency: Thin; Room service appropriate? Yes  Diet effective now                         Precautions / Restrictions Precautions Precautions: Fall Precaution Comments: ostomy bag, JP drain,abd incision Restrictions Weight Bearing Restrictions: No    Has the patient had 2 or more falls or a fall with injury in the past year? 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 Independent   Indoor Mobility: Did the patient need assistance with walking from room to room (with or  without device)? Independent   Stairs: Did the patient need assistance with internal or external stairs (with or without device)? Independent   Functional Cognition: Did the patient need help planning regular tasks such as shopping or remembering to take medications? Independent   Home Assistive Devices / Equipment Home Assistive Devices/Equipment: None Home Equipment: None   Prior Device Use: Indicate devices/aids used by the patient prior to current illness, exacerbation or injury? None of the above   Current Functional Level Cognition   Overall Cognitive Status: Within Functional Limits for tasks assessed Orientation Level: Oriented X4 General Comments: pt is A and O and very motivated    Extremity Assessment (includes Sensation/Coordination)   Upper Extremity Assessment: Generalized weakness  Lower Extremity Assessment: Generalized weakness     ADLs   Overall ADL's : Needs assistance/impaired Grooming: Wash/dry hands, Wash/dry face, Sitting, Set up Lower Body Dressing: Moderate assistance, Maximal assistance, Sitting/lateral leans Lower Body Dressing Details (indicate cue type and reason): to don socks in sitting Toilet Transfer: Moderate assistance, RW Functional mobility during ADLs: Min guard, Minimal assistance, Cueing for sequencing, Rolling walker (for fxl mobility ~30' for fxl HH distances)     Mobility   Overal bed mobility: Needs Assistance Bed Mobility: Supine to Sit Supine to sit: HOB elevated, Min assist Sit to supine: HOB elevated, Min assist General bed mobility comments: increased time     Transfers   Overall transfer level: Needs assistance Equipment used: Rolling walker (2 wheeled) Transfers: Sit to/from  Stand Sit to Stand: Min assist, Mod assist Stand pivot transfers: Mod assist General transfer comment: CGA/MIN A for ADL transfers from elevated surface, MIN/MOD A from standard height bed.     Ambulation / Gait / Stairs / Wheelchair Mobility   Ambulation/Gait Ambulation/Gait assistance: Herbalist (Feet): 75 Feet Assistive device: Rolling walker (2 wheeled) Gait Pattern/deviations: Decreased step length - right, Decreased step length - left, Decreased stride length, Decreased dorsiflexion - right, Shuffle General Gait Details: pt tolerated increased gait distances today with vital stable throughout. Gait velocity: decreased     Posture / Balance Balance Overall balance assessment: Needs assistance Sitting-balance support: Feet supported, Bilateral upper extremity supported Sitting balance-Leahy Scale: Good Standing balance support: Bilateral upper extremity supported, During functional activity Standing balance-Leahy Scale: Fair Standing balance comment: requires UE Support and CGA to sustain balance for static standing and fxl mobility, MIN A for pivoting/turning     Special needs/care consideration                                            Signed        Show:Clear all '[x]'$ Written'[x]'$ Templated'[x]'$ Copied   Added by: '[x]'$ Meryle Ready, RN   '[]'$ Hover for details                                                               WOC Nurse ostomy follow up Patient receiving care in [facility]. Stoma type/location: LUQ colostomy Stomal assessment/size: oval, 1 3/4 inches. Necrotic edge from 4 - 6 o'clock remains present. Sutures intact. Peristomal assessment: intact Treatment options for stomal/peristomal skin:  Output: thin brown Ostomy pouching: 2pc. Barrier ring, # [id number],  wafer # 2, pouch # [id number] Education provided: With my guidance and assistance, patient emptied existing pouch, removed existing pouching system, and performed all steps of application  of new pouching system.  He also told me he emptied his pouch several times yesterday. He had several appropriate questions that were answered to his expressed satisfaction. Enrolled patient in [place] Start Discharge program: Yes   If present in hospital Friday, I plan to return for another pouching change session.  He is to check with his wife and find out if the [employer] program box arrived to his home. Val Riles, RN, MSN, CWOCN, CNS-BC, pager [phone]             Note Details   Author Meryle Ready, RN File Time 06/23/2021  8:05 AM  Author Type Registered Nurse Status Signed  Last Editor Meryle Ready, RN Service [place] # [phone] Admit Date 06/14/2021      Previous Home Environment  Living Arrangements:  (seperated from his wife for 2 years)  Lives With: Alone Available Help at Discharge: Family, Available PRN/intermittently Type of Home: House Home Layout: One level Home Access: Stairs to enter Entrance Stairs-Rails: Left, Right Entrance Stairs-Number of Steps: 3 Bathroom Shower/Tub: Chiropodist: Standard Bathroom Accessibility: Yes How Accessible: Accessible via walker [place]: No   Discharge Living Setting Plans for Discharge Living Setting: Patient's home, Alone (seperated from  his wife for 2 years) Type of Home at Discharge: House Discharge Home Layout: One level Discharge Home Access: Stairs to enter Entrance Stairs-Rails: Right, Left Entrance Stairs-Number of Steps: 3 Discharge Bathroom Shower/Tub: Tub/shower unit Discharge Bathroom Toilet: Standard Discharge Bathroom Accessibility: Yes How Accessible: Accessible via walker Does the patient have any problems obtaining your medications?:  (insurance terms on 07/08/21)   Social/Family/Support Systems Patient Roles: Spouse, Parent Contact Information: wife. Cecille Rubin and son, Legrand Como Anticipated Caregiver: wife and family intermittent  only Anticipated Caregiver's Contact Information: see above Ability/Limitations of Caregiver: they are seperated; son works days Caregiver Availability: Intermittent Discharge Plan Discussed with Primary Caregiver: Yes Is Caregiver In Agreement with Plan?: Yes Does Caregiver/Family have Issues with Lodging/Transportation while Pt is in Rehab?: No   Goals Patient/Family Goal for Rehab: Mod I with PT and OT Expected length of stay: ELOS 7 days Pt/Family Agrees to Admission and willing to participate: Yes Program Orientation Provided & Reviewed with Pt/Caregiver Including Roles  & Responsibilities: Yes   Decrease burden of Care through IP rehab admission: n/a   Possible need for SNF placement upon discharge: not anticipated   Patient Condition: I have reviewed medical records from [facility], spoken with  patient. I discussed via phone for inpatient rehabilitation assessment.  Patient will benefit from ongoing PT and OT, can actively participate in 3 hours of therapy a day 5 days of the week, and can make measurable gains during the admission.  Patient will also benefit from the coordinated team approach during an Inpatient Acute Rehabilitation admission.  The patient will receive intensive therapy as well as Rehabilitation physician, nursing, social worker, and care management interventions.  Due to bladder management, bowel management, safety, skin/wound care, disease management, medication administration, pain management, and patient education the patient requires 24 hour a day rehabilitation nursing.  The patient is currently min assist overall with mobility and basic ADLs.  Discharge setting and therapy post discharge at home with home health is anticipated.  Patient has agreed to participate in the Acute Inpatient Rehabilitation Program  and will admit today.   Preadmission Screen Completed By:  Cleatrice Burke, 06/25/2021 10:15  AM ______________________________________________________________________   Discussed status with Dr. Ranell Patrick on 06/25/2021 at 1015 and received approval for admission today.   Admission Coordinator:  Cleatrice Burke, RN, time [id number] Date  06/25/2021    Assessment/Plan: Diagnosis: Debility Does the need for close, 24 hr/day Medical supervision in concert with the patient's rehab needs make it unreasonable for this patient to be served in a less intensive setting? Yes Co-Morbidities requiring supervision/potential complications: new colostomy, obesity, essential HTN, hypocalcemia, moderate alcohol use disorder Due to bladder management, bowel management, safety, skin/wound care, disease management, medication administration, pain management, and patient education, does the patient require 24 hr/day rehab nursing? Yes Does the patient require coordinated care of a physician, rehab nurse, PT, OT to address physical and functional deficits in the context of the above medical diagnosis(es)? Yes Addressing deficits in the following areas: balance, endurance, locomotion, strength, transferring, bowel/bladder control, bathing, dressing, feeding, grooming, toileting, and psychosocial support Can the patient actively participate in an intensive therapy program of at least 3 hrs of therapy 5 days a week? Yes The potential for patient to make measurable gains while on inpatient rehab is excellent Anticipated functional outcomes upon discharge from inpatient rehab: modified independent PT, modified independent OT, independent SLP Estimated rehab length of stay to reach the above functional goals is: 5-7 days Anticipated discharge destination: Home 10. Overall Rehab/Functional Prognosis: excellent     MD Signature: Leeroy Cha, MD         Revision History                               Note Details  Author Izora Ribas, MD File Time 06/25/2021 10:23 AM  Author Type Physician Status  Signed  Last Editor Izora Ribas, MD Service Physical Medicine and [place] # [id number] Admit Date 06/25/2021

## 2021-06-25 NOTE — H&P (Signed)
Physical Medicine and Rehabilitation Admission H&P  CC: Debility  HPI: Russell Gomez. Russell Gomez is a 60 year old right-handed male with history of alcohol /tobacco abuse, atrial fibrillation maintained on Eliquis followed by Dr. Clayborn Bigness, diabetes mellitus, hyperlipidemia.  Per chart review patient lives alone currently separated from his wife.  Independent prior to admission.  Presented 06/14/2021 to [facility] with nausea, vomiting, nonbloody diarrhea and blood sugars greater than 600 x 3 to 4 days.  Admission chemistry sodium 134 chloride 97 glucose 443, lipase 18, hemoglobin 14.5, WBC 11,600, lactic acid 4.5, urinalysis negative nitrite, magnesium 1 urine drug screen positive marijuana COVID PCR negative, C. difficile PCR negative.  CT abdomen pelvis showed hyperenhancement of the wall of the terminal ileum with small amount of free fluid and punctate focus of free air in the ventral abdomen as well as multifocal punctate free air within the peritoneal cavity indicating possible perforation.  General surgery consulted patient underwent sigmoid colon resection with colostomy 06/14/2021 per Dr.Cintron Ferrel Logan.  Hospital course complicated by acute blood loss anemia 10.5.  Bouts of hypokalemia supplement added.  Intermittent bouts of orthostasis maintained on ProAmatine.  Atrial fibrillation remains controlled maintained on Cardizem as well as amiodarone and his Eliquis was resumed postoperatively.  Bouts of agitation restlessness suspect alcohol withdrawal initially maintained on stepdown unit with CIWA protocol.  Aspiration pneumonia completing course of Augmentin and his diet has been advanced to regular consistency.  Due to patient decreased functional mobility was admitted for a comprehensive rehab program. He currently complains of lower extremity weakness.   Review of Systems  Constitutional:  Positive for malaise/fatigue. Negative for chills and fever.  HENT:  Negative for hearing loss.   Eyes:  Negative for  blurred vision and double vision.  Respiratory:  Positive for shortness of breath. Negative for cough.   Cardiovascular:  Positive for palpitations and leg swelling. Negative for chest pain.  Gastrointestinal:  Positive for constipation, nausea and vomiting.  Genitourinary:  Negative for dysuria, flank pain and hematuria.  Musculoskeletal:  Positive for joint pain and myalgias.  Skin:  Negative for rash.  Neurological:  Positive for weakness.  All other systems reviewed and are negative. Past Medical History:  Diagnosis Date   Arthritis    on operative finger   Dental crowns present    Diabetes type 1, uncontrolled ([place])    IDDM (Dr. Eddie Dibbles @ [facility])   Esophageal reflux    Goiter 04/2012   no current med, has yearly monitoring   High cholesterol    HTN (hypertension)    under control with meds, has been on med x 2 yr.   Past Surgical History:  Procedure Laterality Date   APPENDECTOMY  06/14/2021   Procedure: APPENDECTOMY;  Surgeon: Herbert Pun, MD;  Location: [facility];  Service: General;;   COLON RESECTION SIGMOID  06/14/2021   Procedure: COLON RESECTION SIGMOID WITH END COLOSTOMY CREATION;  Surgeon: Herbert Pun, MD;  Location: [facility];  Service: General;;   DISTAL INTERPHALANGEAL JOINT FUSION Right 03/05/2014   Procedure: DEBRIDEMENT (DIP) DISTAL INTERPHALANGEAL RIGHT MIDDLE FINGER;  Surgeon: Wynonia Sours, MD;  Location: [place];  Service: Orthopedics;  Laterality: Right;   KNEE ARTHROSCOPY     LAPAROTOMY N/A 06/14/2021   Procedure: EXPLORATORY LAPAROTOMY;  Surgeon: Herbert Pun, MD;  Location: [facility];  Service: General;  Laterality: N/A;   MASS EXCISION Right 03/05/2014   Procedure: EXCISION CYST ;  Surgeon: Wynonia Sours, MD;  Location: [place];  Service: Orthopedics;  Laterality: Right;  ANESTHESIA: IV REGINAL FAB   OPEN REDUCTION NASAL FRACTURE  12/26/2008   with closure of nasal lac.   ORIF DISTAL RADIUS FRACTURE Right  12/26/2008   ORIF WRIST FRACTURE Left 05/12/2016   takedown of nonunion/malunion and OPEN REDUCTION INTERNAL FIXATION (ORIF) WRIST FRACTURE;  Surgeon: Corky Mull, MD   PERCUTANEOUS PINNING Left 02/06/2013   Procedure: PINNING PIP OF THE LEFT MIDDLE FINGER ;  Surgeon: Wynonia Sours, MD;  Location: [place];  Service: Orthopedics;  Laterality: Left;   SKIN GRAFT SPLIT THICKNESS LEG / FOOT Right 1985   thigh after trauma vs machine at work   TRIGGER FINGER RELEASE  11/28/2012   Procedure: RELEASE TRIGGER FINGER/A-1 PULLEY;  Surgeon: Wynonia Sours, MD;  Laterality: Left;  EXCISION MASS LEFT RING FINGER, RELEASE A-1 PULLEY LEFT RING FINGER (ganglion cyst)   Family History  Problem Relation Age of Onset   Healthy Father    Hypertension Mother    Hyperlipidemia Mother    Diabetes Maternal Uncle    Coronary artery disease Maternal Uncle    Stroke Maternal Aunt    Diabetes Paternal Grandmother    Diabetes Paternal Uncle    Social History:  reports that he has been smoking cigarettes. He has a 20.00 pack-year smoking history. He has never used smokeless tobacco. He reports current alcohol use of about 7.0 standard drinks per week. He reports that he does not use drugs. Allergies:  Allergies  Allergen Reactions   Insulin Aspart Other (See Comments)    CELLULITIS   Spironolactone Nausea Only and Rash   Hydrochlorothiazide Itching and Rash   Morphine Other (See Comments)    "MAKES ME MEAN"   Medications Prior to Admission  Medication Sig Dispense Refill   [START ON 06/26/2021] amiodarone (PACERONE) 200 MG tablet Take 1 tablet (200 mg total) by mouth daily.     apixaban (ELIQUIS) 5 MG TABS tablet Take 1 tablet (5 mg total) by mouth 2 (two) times daily. 60 tablet 5   calcium-vitamin D (OSCAL 500/200 D-3) 500-200 MG-UNIT tablet Take 1 tablet by mouth 2 (two) times daily. 60 tablet 0   diazepam (VALIUM) 5 MG tablet Take 0.5 tablets (2.5 mg total) by mouth every 12 (twelve) hours. 30  tablet 0   [START ON 06/26/2021] diltiazem (CARDIZEM CD) 180 MG 24 hr capsule Take 1 capsule (180 mg total) by mouth daily.     feeding supplement, GLUCERNA SHAKE, (GLUCERNA SHAKE) LIQD Take 237 mLs by mouth 3 (three) times daily between meals. 123XX123 mL 0   folic acid (FOLVITE) 1 MG tablet Take 1 tablet by mouth once daily 30 tablet 0   [START ON 06/26/2021] furosemide (LASIX) 20 MG tablet Take 1 tablet (20 mg total) by mouth daily. 30 tablet    glucose blood test strip Use 6 (six) times daily     hydrOXYzine (ATARAX/VISTARIL) 25 MG tablet Take 0.5-1 tablets (12.5-25 mg total) by mouth 2 (two) times daily as needed for anxiety or nausea (sedation precautions). 30 tablet 0   insulin aspart (NOVOLOG) 100 UNIT/ML injection Inject 0-5 Units into the skin at bedtime. 10 mL 11   insulin aspart (NOVOLOG) 100 UNIT/ML injection Inject 0-9 Units into the skin 3 (three) times daily with meals. 10 mL 11   insulin glargine-yfgn (SEMGLEE) 100 UNIT/ML injection Inject 0.15 mLs (15 Units total) into the skin at bedtime. 10 mL 11   Insulin Pen Needle 34G X 3.5 MM MISC  1 Dose by Does not apply route daily. 100 each 0   magnesium oxide (MAG-OX) 400 (240 Mg) MG tablet Take 1 tablet (400 mg total) by mouth daily. 30 tablet 0   Multiple Vitamin (MULTIVITAMIN WITH MINERALS) TABS tablet Take 1 tablet by mouth daily.     [START ON 06/26/2021] nicotine (NICODERM CQ - DOSED IN MG/24 HOURS) 21 mg/24hr patch Place 1 patch (21 mg total) onto the skin daily. 28 patch 0   pantoprazole (PROTONIX) 40 MG tablet Take 1 tablet (40 mg total) by mouth 2 (two) times daily before a meal. 180 tablet 1   potassium chloride SA (KLOR-CON M20) 20 MEQ tablet Take 1 tablet (20 mEq total) by mouth daily. 7 tablet 0   pravastatin (PRAVACHOL) 80 MG tablet Take 1 tablet by mouth once daily 90 tablet 0   thiamine 100 MG tablet Take 1 tablet (100 mg total) by mouth daily. 30 tablet 0   traZODone (DESYREL) 50 MG tablet Take 1 tablet (50 mg total) by mouth  at bedtime. 30 tablet 0    Drug Regimen Review Drug regimen was reviewed and remains appropriate with no significant issues identified  Home: Home Living Family/patient expects to be discharged to:: Private residence Living Arrangements:  (seperated from his wife for 2 years) Available Help at Discharge: Family, Available PRN/intermittently Type of Home: House Home Access: Stairs to enter Technical brewer of Steps: 3 Entrance Stairs-Rails: Left, Right Home Layout: One level Bathroom Shower/Tub: Chiropodist: Standard Bathroom Accessibility: Yes Home Equipment: None  Lives With: Alone   Functional History: Prior Function Level of Independence: Independent   Functional Status:  Mobility: Bed Mobility Overal bed mobility: Needs Assistance Bed Mobility: Supine to Sit Supine to sit: HOB elevated, Min assist Sit to supine: HOB elevated, Min assist General bed mobility comments: pt requested to not perform OOB at this time. he is eager for DC home Transfers Overall transfer level: Needs assistance Equipment used: Rolling walker (2 wheeled) Transfers: Sit to/from Stand Sit to Stand: Min assist, Mod assist Stand pivot transfers: Mod assist General transfer comment: CGA/MIN A for ADL transfers from elevated surface, MIN/MOD A from standard height bed. Ambulation/Gait Ambulation/Gait assistance: Min assist Gait Distance (Feet): 75 Feet Assistive device: Rolling walker (2 wheeled) Gait Pattern/deviations: Decreased step length - right, Decreased step length - left, Decreased stride length, Decreased dorsiflexion - right, Shuffle General Gait Details: pt tolerated increased gait distances today with vital stable throughout. Gait velocity: decreased   ADL: ADL Overall ADL's : Needs assistance/impaired Grooming: Wash/dry hands, Wash/dry face, Sitting, Set up Lower Body Dressing: Moderate assistance, Maximal assistance, Sitting/lateral leans Lower Body  Dressing Details (indicate cue type and reason): to don socks in sitting Toilet Transfer: Moderate assistance, RW Functional mobility during ADLs: Min guard, Minimal assistance, Cueing for sequencing, Rolling walker (for fxl mobility ~30' for fxl HH distances)   Cognition: Cognition Overall Cognitive Status: Within Functional Limits for tasks assessed Orientation Level: Oriented X4 Cognition Arousal/Alertness: Awake/alert Behavior During Therapy: WFL for tasks assessed/performed Overall Cognitive Status: Within Functional Limits for tasks assessed General Comments: pt is A and O and very motivated  Physical Exam: Blood pressure 135/81, pulse 99, temperature 98 F (36.7 C), temperature source Oral, resp. rate 16, height '6\' 2"'$  (1.88 m), weight 108.3 kg, SpO2 98 %. Physical Exam Gen: no distress, normal appearing HEENT: oral mucosa pink and moist, NCAT Cardio: Reg rate Chest: normal effort, normal rate of breathing Extremities: both lower extremities edematous Abdominal:  Comments: Colostomy intact  Neurological:     Comments: Patient is alert.  Makes eye contact with examiner.  He appears motivated.  Follows simple commands.  Provides name and age.  MSK: Lower extremities 4-/5 strength Psych: bright and motivated  Results for orders placed or performed during the hospital encounter of 06/25/21 (from the past 48 hour(s))  Glucose, capillary     Status: Abnormal   Collection Time: 06/25/21  5:00 PM  Result Value Ref Range   Glucose-Capillary 379 (H) 70 - 99 mg/dL    Comment: Glucose reference range applies only to samples taken after fasting for at least 8 hours.   No results found.     Medical Problem List and Plan: 1.   Debility secondary to diverticulosis with perforation status post Jeanette Caprice procedure/colostomy 06/14/2021  -patient may shower  -ELOS/Goals: 5-7 days modI 2.  Antithrombotics: -DVT/anticoagulation: Eliquis Pharmaceutical: Other (comment)  -antiplatelet  therapy: N/A 3. Pain Management: Tylenol as needed 4. Mood: Valium 2.5 mg every 6 hours.  Provide Emotional support  -antipsychotic agents: N/A 5. Neuropsych: This patient is capable of making decisions on his own behalf. 6. Colostomy: Initiate colostomy education. Routine skin checks 7. Fluids/Electrolytes/Nutrition: Routine in and outs with follow-up chemistries. Check vitamin D level tomorrow.  8.  Acute blood loss anemia.Follow up CBC 9.  PAF/hypotension.  Amiodarone 200 mg daily, Cardizem 180 mg daily and continue Eliquis 5 mg twice daily.  Continue ProAmatine 10 mg 3 times daily 10.  Alcohol withdrawal with delirium tremors.  Patient initially with CIWA protocol.  Provide counseling. Check magnesium level tomorrow.  11.  Diabetes mellitus.  Latest hemoglobin A1c 8.2 Semglee 15 units nightly.  Diabetic teaching 12.  Aspiration pneumonia.  Complete course of Augmentin 13.Tobacco abuse .Nicoderm patch.Provide counseling  I have personally performed a face to face diagnostic evaluation, including, but not limited to relevant history and physical exam findings, of this patient and developed relevant assessment and plan.  Additionally, I have reviewed and concur with the physician assistant's documentation above.  Izora Ribas, MD 06/25/2021   Lavon Paganini Angiulli, PA-C

## 2021-06-25 NOTE — Progress Notes (Signed)
Inpatient Rehabilitation Medication Review by a Pharmacist  A complete drug regimen review was completed for this patient to identify any potential clinically significant medication issues.  High Risk Drug Classes Is patient taking? Indication by Medication  Antipsychotic No   Anticoagulant Yes Apixaban for atrial fibrillation  Antibiotic No   Opioid No   Antiplatelet No   Hypoglycemics/insulin Yes Sliding scale insulin and semglee for glucose control  Vasoactive Medication No   Chemotherapy No   Other No      Type of Medication Issue Identified Description of Issue Recommendation(s)  Drug Interaction(s) (clinically significant)  N/A   Duplicate Therapy  N/A   Allergy  N/A   No Medication Administration End Date  N/A   Incorrect Dose  N/A   Additional Drug Therapy Needed  Midodrine? Admission note talks about continuing it but dont see previous inpatient use and BP seems normal. F/U in AM  Significant med changes from prior encounter (inform family/care partners about these prior to discharge). N/A   Other       Clinically significant medication issues were identified that warrant physician communication and completion of prescribed/recommended actions by midnight of the next day:  Yes  Name of provider notified for urgent issues identified: F/U in AM  Provider Method of Notification:     Pharmacist comments:   Time spent performing this drug regimen review (minutes):  15 mins   Ramond Craver 06/25/2021 5:25 PM

## 2021-06-25 NOTE — Progress Notes (Signed)
Inpatient Rehabilitation Admissions Coordinator    I have CIR bed at [facility] and Dr Ranell Patrick is admitting. I spoke with patient by phone and he is in agreement., Acute team and TOC made aware. I will make the arrangements to admit today.  Danne Baxter, RN, MSN Rehab Admissions Coordinator [phone] 06/25/2021 10:12 AM

## 2021-06-25 NOTE — Discharge Instructions (Addendum)
Inpatient Rehab Discharge Instructions  MARQUAL CROOKSTON Discharge date and time: No discharge date for patient encounter.   Activities/Precautions/ Functional Status: Activity: activity as tolerated Diet: diabetic diet Wound Care: Routine colostomy care Functional status:  ___ No restrictions     ___ Walk up steps independently ___ 24/7 supervision/assistance   ___ Walk up steps with assistance ___ Intermittent supervision/assistance  ___ Bathe/dress independently ___ Walk with walker     __x_ Bathe/dress with assistance ___ Walk Independently    ___ Shower independently ___ Walk with assistance    ___ Shower with assistance ___ No alcohol     ___ Return to work/school ________  Special Instructions: No driving smoking or alcohol    COMMUNITY REFERRALS UPON DISCHARGE:    [place] Equipment/Items Ordered ROLLING WALKER & 3 IN 1                                                 Agency/Supplier:[facility]  [phone]   My questions have been answered and I understand these instructions. I will adhere to these goals and the provided educational materials after my discharge from the hospital.  Patient/Caregiver Signature _______________________________ Date __________  Clinician Signature _______________________________________ Date __________  Please bring this form and your medication list with you to all your follow-up doctor's appointments.

## 2021-06-25 NOTE — Progress Notes (Signed)
Patient's am labs drawn from PICC line.  IV team placed a PIV in patient L forearm.  Nurse removed patient's PICC line at 0430, withdrawing PICC line while covered with vaseline gauze.  Vaseline gauze covered with gauze and kerlix and secured with tape.  PICC line length 38 cm.  Patient tolerated procedure well.

## 2021-06-26 LAB — GLUCOSE, CAPILLARY
Glucose-Capillary: 285 mg/dL — ABNORMAL HIGH (ref 70–99)
Glucose-Capillary: 289 mg/dL — ABNORMAL HIGH (ref 70–99)
Glucose-Capillary: 345 mg/dL — ABNORMAL HIGH (ref 70–99)
Glucose-Capillary: 325 mg/dL — ABNORMAL HIGH (ref 70–99)

## 2021-06-26 LAB — VITAMIN D 25 HYDROXY (VIT D DEFICIENCY, FRACTURES): Vit D, 25-Hydroxy: 20.63 ng/mL — ABNORMAL LOW (ref 30–100)

## 2021-06-26 LAB — CBC WITH DIFFERENTIAL/PLATELET
Abs Immature Granulocytes: 0.17 10*3/uL — ABNORMAL HIGH (ref 0.00–0.07)
Basophils Absolute: 0.1 10*3/uL (ref 0.0–0.1)
Basophils Relative: 0 %
Eosinophils Absolute: 0.2 10*3/uL (ref 0.0–0.5)
Eosinophils Relative: 1 %
HCT: 27.1 % — ABNORMAL LOW (ref 39.0–52.0)
Hemoglobin: 9 g/dL — ABNORMAL LOW (ref 13.0–17.0)
Immature Granulocytes: 1 %
Lymphocytes Relative: 21 %
Lymphs Abs: 2.6 10*3/uL (ref 0.7–4.0)
MCH: 33.5 pg (ref 26.0–34.0)
MCHC: 33.2 g/dL (ref 30.0–36.0)
MCV: 100.7 fL — ABNORMAL HIGH (ref 80.0–100.0)
Monocytes Absolute: 0.8 10*3/uL (ref 0.1–1.0)
Monocytes Relative: 6 %
Neutro Abs: 8.8 10*3/uL — ABNORMAL HIGH (ref 1.7–7.7)
Neutrophils Relative %: 71 %
Platelets: 538 10*3/uL — ABNORMAL HIGH (ref 150–400)
RBC: 2.69 MIL/uL — ABNORMAL LOW (ref 4.22–5.81)
RDW: 15.9 % — ABNORMAL HIGH (ref 11.5–15.5)
WBC: 12.5 10*3/uL — ABNORMAL HIGH (ref 4.0–10.5)
nRBC: 0 % (ref 0.0–0.2)

## 2021-06-26 LAB — COMPREHENSIVE METABOLIC PANEL
ALT: 14 U/L (ref 0–44)
AST: 20 U/L (ref 15–41)
Albumin: 1.3 g/dL — ABNORMAL LOW (ref 3.5–5.0)
Alkaline Phosphatase: 111 U/L (ref 38–126)
Anion gap: 5 (ref 5–15)
BUN: <5 mg/dL — ABNORMAL LOW (ref 6–20)
CO2: 28 mmol/L (ref 22–32)
Calcium: 7.8 mg/dL — ABNORMAL LOW (ref 8.9–10.3)
Chloride: 102 mmol/L (ref 98–111)
Creatinine, Ser: 0.65 mg/dL (ref 0.61–1.24)
GFR, Estimated: >60 mL/min (ref >60)
Glucose, Bld: 237 mg/dL — ABNORMAL HIGH (ref 70–99)
Potassium: 4 mmol/L (ref 3.5–5.1)
Sodium: 135 mmol/L (ref 135–145)
Total Bilirubin: 0.5 mg/dL (ref 0.3–1.2)
Total Protein: 5.1 g/dL — ABNORMAL LOW (ref 6.5–8.1)

## 2021-06-26 LAB — MAGNESIUM: Magnesium: 1.4 mg/dL — ABNORMAL LOW (ref 1.7–2.4)

## 2021-06-26 LAB — VITAMIN B12: Vitamin B-12: 578 pg/mL (ref 180–914)

## 2021-06-26 MED ORDER — VITAMIN D (ERGOCALCIFEROL) 1.25 MG (50000 UNIT) PO CAPS
50000.0000 [IU] | ORAL_CAPSULE | ORAL | Status: DC
  Administered 2021-06-26: 50000 [IU] via ORAL
  Filled 2021-06-26: qty 1

## 2021-06-26 MED ORDER — MAGNESIUM SULFATE 2 GM/50ML IV SOLN
2.0000 g | Freq: Once | INTRAVENOUS | Status: AC
  Administered 2021-06-26: 2 g via INTRAVENOUS
  Filled 2021-06-26: qty 50

## 2021-06-26 MED ORDER — INSULIN GLARGINE-YFGN 100 UNIT/ML ~~LOC~~ SOLN
16.0000 [IU] | Freq: Every day | SUBCUTANEOUS | Status: DC
  Administered 2021-06-26 – 2021-06-27 (×2): 16 [IU] via SUBCUTANEOUS
  Filled 2021-06-26 (×3): qty 0.16

## 2021-06-26 MED ORDER — JUVEN PO PACK
1.0000 | PACK | Freq: Two times a day (BID) | ORAL | Status: DC
Start: 2021-06-26 — End: 2021-07-01
  Administered 2021-06-26 – 2021-06-30 (×9): 1 via ORAL
  Filled 2021-06-26 (×9): qty 1

## 2021-06-26 MED ORDER — FUROSEMIDE 40 MG PO TABS
40.0000 mg | ORAL_TABLET | Freq: Every day | ORAL | Status: DC
  Administered 2021-06-27: 40 mg via ORAL
  Filled 2021-06-26: qty 1

## 2021-06-26 NOTE — Progress Notes (Signed)
Physical Therapy Session Note  Patient Details  Name: Russell Gomez MRN: [id number] Date of Birth: 1961/01/10  Today's Date: 06/26/2021 PT Individual Time: 1307-1406 PT Individual Time Calculation (min): 59 min   Short Term Goals: Week 1:  PT Short Term Goal 1 (Week 1): pt will transfer bed<>chair with LRAD and supervision PT Short Term Goal 2 (Week 1): pt will ambulate 46f with LRAD and CGA PT Short Term Goal 3 (Week 1): pt will perform simulated car transfer with LRAD and CGA  Skilled Therapeutic Interventions/Progress Updates:  Patient in w/c in room.  Agreeable to PT.  Patient propelled in w/c x 120' with CGA to S and cues for technique.  Patient sit to stand to RW with mod A lifting from low seat and ambulated x ~90' with RW and CGA with flexed posture, decreased stride length, decreased foot clearance and shuffling steps.  Patient negotiated 8 (3") steps with rails and min A with cues for sequencing, though pt performed self selected sequence despite c/o R knee pain. Patient ambulated another 819 to ortho gym, but unable to practice car transfer as gym closed for educational inservice.  Patient assisted in w/c back to general gym and transferred to mat with RW and mod to max A for sit to stand from low seat.  Patient seated on mat for core strengthening: trunk extension holding tall posture 10 sec x 6 reps, seated marching  2 x 10 reps.  While pt exercising adjusted wheelchair for pt's height.  Patient transferred to w/c stand step with RW and min A.  Propelled in w/c another 100' with S till fatigued.  Assisted rest of distance to room and patient sit to stand and step to bed with min A.  Sit to supine mod A for both LE's.  Left in supine with call bell and needs in reach with bed alarm active.   Therapy Documentation Precautions:  Precautions Precautions: Fall Precaution Comments: ostomy. abdominal incision Restrictions Weight Bearing Restrictions: No  Pain Assessment Pain Scale:  0-10 Pain Score: 0-No pain  Therapy/Group: Individual Therapy  CReginia NaasCKenilworth PT 06/26/2021, 1:00 PM

## 2021-06-26 NOTE — Evaluation (Signed)
Occupational Therapy Assessment and Plan  Patient Details  Name: KAHLI FITZGERALD MRN: [id number] Date of Birth: [birth year]  OT Diagnosis: abnormal posture, muscle weakness (generalized), and swelling of limb (BLE edema) Rehab Potential:   ELOS: 10-14   Today's Date: 06/26/2021 OT Individual Time: 1000-1100 OT Individual Time Calculation (min): 60 min     Hospital Problem: Principal Problem:   Debility   Past Medical History:  Past Medical History:  Diagnosis Date   Arthritis    on operative finger   Dental crowns present    Diabetes type 1, uncontrolled ([place])    IDDM (Dr. Eddie Dibbles @ [facility])   Esophageal reflux    Goiter 04/2012   no current med, has yearly monitoring   High cholesterol    HTN (hypertension)    under control with meds, has been on med x 2 yr.   Past Surgical History:  Past Surgical History:  Procedure Laterality Date   APPENDECTOMY  06/14/2021   Procedure: APPENDECTOMY;  Surgeon: Herbert Pun, MD;  Location: [facility];  Service: General;;   COLON RESECTION SIGMOID  06/14/2021   Procedure: COLON RESECTION SIGMOID WITH END COLOSTOMY CREATION;  Surgeon: Herbert Pun, MD;  Location: [facility];  Service: General;;   DISTAL INTERPHALANGEAL JOINT FUSION Right 03/05/2014   Procedure: DEBRIDEMENT (DIP) DISTAL INTERPHALANGEAL RIGHT MIDDLE FINGER;  Surgeon: Wynonia Sours, MD;  Location: [place];  Service: Orthopedics;  Laterality: Right;   KNEE ARTHROSCOPY     LAPAROTOMY N/A 06/14/2021   Procedure: EXPLORATORY LAPAROTOMY;  Surgeon: Herbert Pun, MD;  Location: [facility];  Service: General;  Laterality: N/A;   MASS EXCISION Right 03/05/2014   Procedure: EXCISION CYST ;  Surgeon: Wynonia Sours, MD;  Location: [place];  Service: Orthopedics;  Laterality: Right;  ANESTHESIA: IV REGINAL FAB   OPEN REDUCTION NASAL FRACTURE  12/26/2008   with closure of nasal lac.   ORIF DISTAL RADIUS FRACTURE Right 12/26/2008   ORIF WRIST FRACTURE  Left 05/12/2016   takedown of nonunion/malunion and OPEN REDUCTION INTERNAL FIXATION (ORIF) WRIST FRACTURE;  Surgeon: Corky Mull, MD   PERCUTANEOUS PINNING Left 02/06/2013   Procedure: PINNING PIP OF THE LEFT MIDDLE FINGER ;  Surgeon: Wynonia Sours, MD;  Location: [place];  Service: Orthopedics;  Laterality: Left;   SKIN GRAFT SPLIT THICKNESS LEG / FOOT Right 1985   thigh after trauma vs machine at work   TRIGGER FINGER RELEASE  11/28/2012   Procedure: RELEASE TRIGGER FINGER/A-1 PULLEY;  Surgeon: Wynonia Sours, MD;  Laterality: Left;  EXCISION MASS LEFT RING FINGER, RELEASE A-1 PULLEY LEFT RING FINGER (ganglion cyst)    Assessment & Plan Clinical Impression: Pt is a 60 y/o M who presented [facility] ER on 06/14/21 with a 3 day hx of lower abdominal pain. CT revealed diverticular disease with perforation. Pt underwent emergent sigmoid colon resection with colostomy & appendectomy. On 06/16/21 his hospital course was comlicated by severe ETOH withdrawal symptoms. Pt is currently being treated for paroxysmal a-fib with rapid major response. PMH: chronic EtOH abuse, uncontrolled DM1, high cholesterol, HTN  Patient currently requires mod with basic self-care skills secondary to muscle weakness, decreased cardiorespiratoy endurance, decreased awareness, decreased problem solving, and decreased safety awareness, and decreased sitting balance, decreased standing balance, decreased postural control, and decreased balance strategies.  Prior to hospitalization, patient could complete BADL/IADL with independent .  Patient will benefit from skilled intervention to increase independence with basic self-care skills and  increase level of independence with iADL prior to discharge home with care partner.  Anticipate patient will require intermittent supervision and follow up home health.  OT - End of Session Activity Tolerance: Tolerates 30+ min activity with multiple rests Endurance Deficit:  Yes Endurance Deficit Description: pt required rest breaks in between transfers OT Assessment OT Barriers to Discharge: Decreased caregiver support;Lack of/limited family support;Home environment access/layout OT Patient demonstrates impairments in the following area(s): Balance;Endurance;Nutrition;Safety;Skin Integrity OT Basic ADL's Functional Problem(s): Grooming;Bathing;Dressing;Toileting OT Advanced ADL's Functional Problem(s): Simple Meal Preparation OT Transfers Functional Problem(s): Toilet;Tub/Shower OT Additional Impairment(s): None OT Plan OT Intensity: Minimum of 1-2 x/day, 45 to 90 minutes OT Frequency: 5 out of 7 days OT Duration/Estimated Length of Stay: 10-14 OT Treatment/Interventions: Balance/vestibular training;Discharge planning;Pain management;Self Care/advanced ADL retraining;Therapeutic Activities;UE/LE Coordination activities;Therapeutic Exercise;Skin care/wound managment;Patient/family education;Functional mobility training;Disease mangement/prevention;Community reintegration;Cognitive remediation/compensation;DME/adaptive equipment instruction;Neuromuscular re-education;Psychosocial support;UE/LE Strength taining/ROM;Wheelchair propulsion/positioning OT Self Feeding Anticipated Outcome(s): MOD I OT Basic Self-Care Anticipated Outcome(s): MOD I OT Toileting Anticipated Outcome(s): MOD I toileting; S shower OT Bathroom Transfers Anticipated Outcome(s): MOD I toileting; S shower OT Recommendation Patient destination: Home Follow Up Recommendations: Home health OT Equipment Recommended: 3 in 1 bedside comode;Tub/shower bench   OT Evaluation Precautions/Restrictions  Precautions Precautions: Fall Precaution Comments: ostomy. abdominal incision Restrictions Weight Bearing Restrictions: No General Chart Reviewed: Yes Family/Caregiver Present: No Vital Signs   Pain Pain Assessment Pain Scale: 0-10 Pain Score: 0-No pain Home Living/Prior Functioning Home  Living Family/patient expects to be discharged to:: Private residence Living Arrangements: Alone Available Help at Discharge: Family, Available PRN/intermittently Type of Home: House Home Access: Stairs to enter Technical brewer of Steps: 3 Entrance Stairs-Rails: Right, Left Home Layout: One level Bathroom Shower/Tub: Optometrist: Yes  Lives With: Alone Prior Function Level of Independence: Independent with basic ADLs, Independent with transfers, Independent with homemaking with ambulation, Independent with gait  Able to Take Stairs?: Yes Driving: Yes 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 Leisure: Hobbies-yes (Comment) Comments: used to enjoy playing golf Vision Baseline Vision/History: No visual deficits Vision Assessment?: No apparent visual deficits Perception  Perception: Within Functional Limits Praxis Praxis: Intact Cognition Overall Cognitive Status: Within Functional Limits for tasks assessed Arousal/Alertness: Awake/alert Orientation Level: Person;Place;Situation Person: Oriented Place: Oriented Situation: Oriented Year: 2022 Month: August Day of Week: Correct Memory: Appears intact Immediate Memory Recall: Sock;Blue;Bed Memory Recall Sock: Not able to recall Memory Recall Blue: Without Cue Memory Recall Bed: With Cue Awareness: Appears intact Problem Solving: Appears intact Safety/Judgment: Appears intact Sensation   WFL Motor  Motor Motor: Abnormal postural alignment and control Motor - Skilled Clinical Observations: grossly uncoordinated due to decreased balance/postural control and generalized weakness and deconditioning  Trunk/Postural Assessment  Cervical Assessment Cervical Assessment: Exceptions to [facility] (forward head) Thoracic Assessment Thoracic Assessment: Exceptions to [facility] (kyphosis) Lumbar Assessment Lumbar Assessment: Exceptions to  [facility] (posterior pelvic tilt in sitting) Postural Control Postural Control: Deficits on evaluation  Balance Balance Balance Assessed: Yes Static Sitting Balance Static Sitting - Balance Support: Feet supported;Bilateral upper extremity supported Static Sitting - Level of Assistance: 6: Modified independent (Device/Increase time) Dynamic Sitting Balance Dynamic Sitting - Balance Support: Feet supported;No upper extremity supported Dynamic Sitting - Level of Assistance: 5: Stand by assistance (supervision) Static Standing Balance Static Standing - Balance Support: No upper extremity supported Static Standing - Level of Assistance: 4: Min assist Dynamic Standing Balance Dynamic Standing - Balance Support: No upper extremity supported Dynamic Standing -  Level of Assistance: 3: Mod assist Dynamic Standing - Comments: with transfers only Extremity/Trunk Assessment RUE Assessment RUE Assessment: Exceptions to [facility] General Strength Comments: generalized weakness LUE Assessment LUE Assessment: Exceptions to [facility] General Strength Comments: generalized weakness  Care Tool Care Tool Self Care Eating   Eating Assist Level: Independent    Oral Care    Oral Care Assist Level: Independent    Bathing   Body parts bathed by patient: Right arm;Left arm;Chest;Abdomen;Front perineal area;Right upper leg;Left upper leg;Face Body parts bathed by helper: Buttocks;Left lower leg;Right lower leg   Assist Level: Moderate Assistance - Patient 50 - 74%    Upper Body Dressing(including orthotics)   What is the patient wearing?: Pull over shirt   Assist Level: Supervision/Verbal cueing    Lower Body Dressing (excluding footwear)   What is the patient wearing?: Pants Assist for lower body dressing: Maximal Assistance - Patient 25 - 49% (power up into standing)    Putting on/Taking off footwear   What is the patient wearing?: Non-skid slipper socks Assist for footwear: Supervision/Verbal cueing        Care Tool Toileting Toileting activity   Assist for toileting: Moderate Assistance - Patient 50 - 74%     Care Tool Bed Mobility Roll left and right activity   Roll left and right assist level: Supervision/Verbal cueing    Sit to lying activity        Lying to sitting edge of bed activity   Lying to sitting edge of bed assist level: Supervision/Verbal cueing     Care Tool Transfers Sit to stand transfer   Sit to stand assist level: Moderate Assistance - Patient 50 - 74%    Chair/bed transfer   Chair/bed transfer assist level: Moderate Assistance - Patient 50 - 74%     Toilet transfer   Assist Level: Moderate Assistance - Patient 50 - 74%     Care Tool Cognition Expression of Ideas and Wants Expression of Ideas and Wants: Without difficulty (complex and basic) - expresses complex messages without difficulty and with speech that is clear and easy to understand   Understanding Verbal and Non-Verbal Content Understanding Verbal and Non-Verbal Content: Understands (complex and basic) - clear comprehension without cues or repetitions   Memory/Recall Ability *first 3 days only Memory/Recall Ability *first 3 days only: Current season;That he or she is in a hospital/hospital unit    Refer to Care Plan for [place] 1 OT Short Term Goal 1 (Week 1): Pt will STS with LRAD and MIN A in prep for LB dressing OT Short Term Goal 2 (Week 1): Pt will empty ostomy with set up OT Short Term Goal 3 (Week 1): Pt will transfer to TTB and LRAD with MIN A OT Short Term Goal 4 (Week 1): Pt will don pants iwht MIN A  Recommendations for other services: None    Skilled Therapeutic Intervention 1:1. Pt received in bed agreeable to OT. Pt educated on OT role/purpose, CIR, ELOS, POC and recovery. Pt biggest deficit is BLE weakness, poor activity tolernace and decreased insight into implications at home. Pt requires mod-MAX A from low surfaces for transitional movement,  but MIN A once up on feet with RW. Pt completes BADL as stated below and ambulation with RW 86' with MAX A to power up/MIN A once in stance. Exited session with pt seated in w/c, exit alarm on and call light in reach   ADL ADL Grooming: Supervision/safety Where  Assessed-Grooming: Clinical biochemist Bathing: Supervision/safety Where Assessed-Upper Body Bathing: Shower Lower Body Bathing: Maximal assistance Where Assessed-Lower Body Bathing: Shower Upper Body Dressing: Supervision/safety Where Assessed-Upper Body Dressing: Wheelchair Lower Body Dressing: Maximal assistance Where Assessed-Lower Body Dressing: Wheelchair Toileting: Maximal assistance Where Assessed-Toileting: Recruitment consultant Transfer: Moderate assistance Toilet Transfer Method: Stand pivot Toilet Transfer Equipment: Energy manager Method: Psychologist, educational: Moderate assistance Firefighter  Bed Mobility Bed Mobility: Rolling Right;Right Sidelying to Sit Rolling Right: Supervision/verbal cueing Right Sidelying to Sit: Supervision/Verbal cueing Transfers Sit to Stand: Moderate Assistance - Patient 50-74% Stand to Sit: Minimal Assistance - Patient > 75%   Discharge Criteria: Patient will be discharged from OT if patient refuses treatment 3 consecutive times without medical reason, if treatment goals not met, if there is a change in medical status, if patient makes no progress towards goals or if patient is discharged from hospital.  The above assessment, treatment plan, treatment alternatives and goals were discussed and mutually agreed upon: by patient  Tonny Branch 06/26/2021, 12:30 PM

## 2021-06-26 NOTE — Progress Notes (Signed)
Patient information reviewed and entered into [facility] System by Becky Emmilee Reamer, PPS coordinator. Information including medical coding, function ability, and quality indicators will be reviewed and updated through discharge.   

## 2021-06-26 NOTE — IPOC Note (Signed)
Overall Plan of Care [facility]) Patient Details Name: Russell Gomez MRN: [id number] DOB: 03-20-1961  Admitting Diagnosis: Debility  Hospital Problems: Principal Problem:   Debility     Functional Problem List: Nursing Endurance, Pain, Medication Management, Bowel, Bladder, Safety, Nutrition  PT Balance, Edema, Endurance, Motor, Nutrition, Skin Integrity  OT Balance, Endurance, Nutrition, Safety, Skin Integrity  SLP    TR         Basic ADL's: OT Grooming, Bathing, Dressing, Toileting     Advanced  ADL's: OT Simple Meal Preparation     Transfers: PT Bed Mobility, Bed to Chair, Car, Manufacturing systems engineer, Metallurgist: PT Ambulation, Emergency planning/management officer, Stairs     Additional Impairments: OT None  SLP        TR      Anticipated Outcomes Item Anticipated Outcome  Self Feeding MOD I  Swallowing      Basic self-care  MOD I  Toileting  MOD I toileting; S shower   Bathroom Transfers MOD I toileting; S shower  Bowel/Bladder  manage bowel w min assist and bladder independently  Transfers  mod I with LRAD  Locomotion  mod I with LRAD  Communication     Cognition     Pain  at or below level 4  Safety/Judgment  maintain w cues/reminders   Therapy Plan: PT Intensity: Minimum of 1-2 x/day ,45 to 90 minutes PT Frequency: 5 out of 7 days PT Duration Estimated Length of Stay: 10-12 days OT Intensity: Minimum of 1-2 x/day, 45 to 90 minutes OT Frequency: 5 out of 7 days OT Duration/Estimated Length of Stay: 10-14     Due to the current state of emergency, patients may not be receiving their 3-hours of Medicare-mandated therapy.   Team Interventions: Nursing Interventions Bladder Management, Disease Management/Prevention, Medication Management, Discharge Planning, Pain Management, Bowel Management, Skin Care/Wound Management, Patient/Family Education  PT interventions Ambulation/gait training, Discharge planning, Functional mobility training,  Therapeutic Activities, Psychosocial support, Balance/vestibular training, Disease management/prevention, Neuromuscular re-education, Skin care/wound management, Therapeutic Exercise, Wheelchair propulsion/positioning, DME/adaptive equipment instruction, Pain management, Splinting/orthotics, UE/LE Strength taining/ROM, Community reintegration, Technical sales engineer stimulation, Patient/family education, IT trainer, UE/LE Coordination activities  OT Interventions Training and development officer, Discharge planning, Pain management, Self Care/advanced ADL retraining, Therapeutic Activities, UE/LE Coordination activities, Therapeutic Exercise, Skin care/wound managment, Patient/family education, Functional mobility training, Disease mangement/prevention, Community reintegration, Cognitive remediation/compensation, Engineer, drilling, Neuromuscular re-education, Psychosocial support, UE/LE Strength taining/ROM, Wheelchair propulsion/positioning  SLP Interventions    TR Interventions    SW/CM Interventions Discharge Planning, Psychosocial Support, Patient/Family Education   Barriers to Discharge MD  Medical stability  Nursing Decreased caregiver support, Home environment access/layout, Wound Care, Other (comments) (new ostomy) 1 level 3ste , bil rails w wife/son-works during the day  PT Inaccessible home environment, Decreased caregiver support, Home environment access/layout, Wound Care ostomy bag, ex-wife can only provide intermittent assist, 3 STE with 2 rails but can only reach 1  OT Decreased caregiver support, Lack of/limited family support, Home environment access/layout    SLP      SW Decreased caregiver support, Insurance for SNF coverage, Medication compliance     Team Discharge Planning: Destination: PT-Home ,OT- Home , SLP-  Projected Follow-up: PT-Home health PT, OT-  Home health OT, SLP-  Projected Equipment Needs: PT-To be determined, OT- 3 in 1 bedside comode,  Tub/shower bench, SLP-  Equipment Details: PT-has none, OT-  Patient/family involved in discharge planning: PT- Patient,  OT-Patient, SLP-   MD ELOS: 5-7  days modI Medical Rehab Prognosis:  Excellent Assessment: Russell Gomez is a 60 year old man admitted to CIR with debility secondary to diverticulosis with perforation status post [facility] procedure/colostomy 06/14/2021. Medications are being managed, and labs and vitals are being monitored regularly.      See Team Conference Notes for weekly updates to the plan of care

## 2021-06-26 NOTE — Evaluation (Signed)
Physical Therapy Assessment and Plan  Patient Details  Name: BUCK MCAFFEE MRN: [id number] Date of Birth: 1961/04/30  PT Diagnosis: Abnormal posture, Abnormality of gait, Difficulty walking, Edema, and Muscle weakness Rehab Potential: Good ELOS: 10-12 days   Today's Date: 06/26/2021 PT Individual Time: 0800-0856 PT Individual Time Calculation (min): 56 min    Hospital Problem: Principal Problem:   Debility   Past Medical History:  Past Medical History:  Diagnosis Date   Arthritis    on operative finger   Dental crowns present    Diabetes type 1, uncontrolled ([place])    IDDM (Dr. Eddie Dibbles @ [facility])   Esophageal reflux    Goiter 04/2012   no current med, has yearly monitoring   High cholesterol    HTN (hypertension)    under control with meds, has been on med x 2 yr.   Past Surgical History:  Past Surgical History:  Procedure Laterality Date   APPENDECTOMY  06/14/2021   Procedure: APPENDECTOMY;  Surgeon: Herbert Pun, MD;  Location: [facility];  Service: General;;   COLON RESECTION SIGMOID  06/14/2021   Procedure: COLON RESECTION SIGMOID WITH END COLOSTOMY CREATION;  Surgeon: Herbert Pun, MD;  Location: [facility];  Service: General;;   DISTAL INTERPHALANGEAL JOINT FUSION Right 03/05/2014   Procedure: DEBRIDEMENT (DIP) DISTAL INTERPHALANGEAL RIGHT MIDDLE FINGER;  Surgeon: Wynonia Sours, MD;  Location: [place];  Service: Orthopedics;  Laterality: Right;   KNEE ARTHROSCOPY     LAPAROTOMY N/A 06/14/2021   Procedure: EXPLORATORY LAPAROTOMY;  Surgeon: Herbert Pun, MD;  Location: [facility];  Service: General;  Laterality: N/A;   MASS EXCISION Right 03/05/2014   Procedure: EXCISION CYST ;  Surgeon: Wynonia Sours, MD;  Location: [place];  Service: Orthopedics;  Laterality: Right;  ANESTHESIA: IV REGINAL FAB   OPEN REDUCTION NASAL FRACTURE  12/26/2008   with closure of nasal lac.   ORIF DISTAL RADIUS FRACTURE Right 12/26/2008   ORIF WRIST  FRACTURE Left 05/12/2016   takedown of nonunion/malunion and OPEN REDUCTION INTERNAL FIXATION (ORIF) WRIST FRACTURE;  Surgeon: Corky Mull, MD   PERCUTANEOUS PINNING Left 02/06/2013   Procedure: PINNING PIP OF THE LEFT MIDDLE FINGER ;  Surgeon: Wynonia Sours, MD;  Location: [place];  Service: Orthopedics;  Laterality: Left;   SKIN GRAFT SPLIT THICKNESS LEG / FOOT Right 1985   thigh after trauma vs machine at work   TRIGGER FINGER RELEASE  11/28/2012   Procedure: RELEASE TRIGGER FINGER/A-1 PULLEY;  Surgeon: Wynonia Sours, MD;  Laterality: Left;  EXCISION MASS LEFT RING FINGER, RELEASE A-1 PULLEY LEFT RING FINGER (ganglion cyst)    Assessment & Plan Clinical Impression: Patient is a 60 y.o. year old male with history of alcohol /tobacco abuse, atrial fibrillation maintained on Eliquis followed by Dr. Clayborn Bigness, diabetes mellitus, hyperlipidemia.  Per chart review patient lives alone currently separated from his wife.  Independent prior to admission.  Presented 06/14/2021 to [facility] with nausea, vomiting, nonbloody diarrhea and blood sugars greater than 600 x 3 to 4 days.  Admission chemistry sodium 134 chloride 97 glucose 443, lipase 18, hemoglobin 14.5, WBC 11,600, lactic acid 4.5, urinalysis negative nitrite, magnesium 1 urine drug screen positive marijuana COVID PCR negative, C. difficile PCR negative.  CT abdomen pelvis showed hyperenhancement of the wall of the terminal ileum with small amount of free fluid and punctate focus of free air in the ventral abdomen as well as multifocal punctate free air within the peritoneal  cavity indicating possible perforation.  General surgery consulted patient underwent sigmoid colon resection with colostomy 06/14/2021 per Dr.Cintron Ferrel Logan.  Hospital course complicated by acute blood loss anemia 10.5.  Bouts of hypokalemia supplement added.  Intermittent bouts of orthostasis maintained on ProAmatine.  Atrial fibrillation remains controlled maintained on Cardizem as  well as amiodarone and his Eliquis was resumed postoperatively.  Bouts of agitation restlessness suspect alcohol withdrawal initially maintained on stepdown unit with CIWA protocol.  Aspiration pneumonia completing course of Augmentin and his diet has been advanced to regular consistency.  Due to patient decreased functional mobility was admitted for a comprehensive rehab program. He currently complains of lower extremity weakness.   Patient currently requires mod with mobility secondary to muscle weakness, decreased cardiorespiratoy endurance, and decreased standing balance, decreased postural control, and decreased balance strategies.  Prior to hospitalization, patient was independent  with mobility and lived with Alone in a House home.  Home access is 3Stairs to enter.  Patient will benefit from skilled PT intervention to maximize safe functional mobility, minimize fall risk, and decrease caregiver burden for planned discharge home with intermittent assist.  Anticipate patient will benefit from follow up [facility] at discharge.  PT - End of Session Activity Tolerance: Tolerates 30+ min activity with multiple rests Endurance Deficit: Yes Endurance Deficit Description: pt required rest breaks in between transfers PT Assessment Rehab Potential (ACUTE/IP ONLY): Good PT Barriers to Discharge: [place] home environment;Decreased caregiver support;Home environment access/layout;Wound Care PT Barriers to Discharge Comments: ostomy bag, ex-wife can only provide intermittent assist, 3 STE with 2 rails but can only reach 1 PT Patient demonstrates impairments in the following area(s): Balance;Edema;Endurance;Motor;Nutrition;Skin Integrity PT Transfers Functional Problem(s): Bed Mobility;Bed to Chair;Car;Furniture PT Locomotion Functional Problem(s): Ambulation;Wheelchair Mobility;Stairs PT Plan PT Intensity: Minimum of 1-2 x/day ,45 to 90 minutes PT Frequency: 5 out of 7 days PT Duration Estimated Length of  Stay: 10-12 days PT Treatment/Interventions: Ambulation/gait training;Discharge planning;Functional mobility training;Therapeutic Activities;Psychosocial support;Balance/vestibular training;Disease management/prevention;Neuromuscular re-education;Skin care/wound management;Therapeutic Exercise;Wheelchair propulsion/positioning;DME/adaptive equipment instruction;Pain management;Splinting/orthotics;UE/LE Strength taining/ROM;Community reintegration;Functional electrical stimulation;Patient/family education;Stair training;UE/LE Coordination activities PT Transfers Anticipated Outcome(s): mod I with LRAD PT Locomotion Anticipated Outcome(s): mod I with LRAD PT Recommendation Follow Up Recommendations: Home health PT Patient destination: Home Equipment Recommended: To be determined Equipment Details: has none   PT Evaluation Precautions/Restrictions Precautions Precautions: Fall Precaution Comments: ostomy. abdominal incision Restrictions Weight Bearing Restrictions: No Home Living/Prior Functioning Home Living Living Arrangements: Alone Available Help at Discharge: Family;Available PRN/intermittently Type of Home: House Home Access: Stairs to enter [employer] of Steps: 3 Entrance Stairs-Rails: Right;Left Home Layout: One level Bathroom Shower/Tub: Chiropodist: Standard Bathroom Accessibility: Yes  Lives With: Alone Prior Function Level of Independence: Independent with basic ADLs;Independent with transfers;Independent with homemaking with ambulation;Independent with gait  Able to Take Stairs?: Yes Driving: Yes 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 Leisure: Hobbies-yes (Comment) Comments: used to enjoy playing golf Cognition Overall Cognitive Status: Within Functional Limits for tasks assessed Arousal/Alertness: Awake/alert Orientation Level: Oriented X4 Memory: Appears intact Awareness: Appears  intact Problem Solving: Appears intact Safety/Judgment: Appears intact Sensation Sensation Light Touch: Appears Intact Proprioception: Appears Intact Coordination Gross Motor Movements are Fluid and Coordinated: No Fine Motor Movements are Fluid and Coordinated: No Coordination and Movement Description: grossly uncoordinated due to decreased balance/postural control and generalized weakness and deconditioning Finger Nose Finger Test: tremors bilaterally Heel Shin Test: decreased ROM and weaker R>L Motor  Motor Motor: Abnormal postural alignment and control Motor -  Skilled Clinical Observations: grossly uncoordinated due to decreased balance/postural control and generalized weakness and deconditioning  Trunk/Postural Assessment  Cervical Assessment Cervical Assessment: Exceptions to [facility] (forward head) Thoracic Assessment Thoracic Assessment: Exceptions to [facility] (kyphosis) Lumbar Assessment Lumbar Assessment: Exceptions to [facility] (posterior pelvic tilt in sitting) Postural Control Postural Control: Deficits on evaluation  Balance Balance Balance Assessed: Yes Static Sitting Balance Static Sitting - Balance Support: Feet supported;Bilateral upper extremity supported Static Sitting - Level of Assistance: 6: Modified independent (Device/Increase time) Dynamic Sitting Balance Dynamic Sitting - Balance Support: Feet supported;No upper extremity supported Dynamic Sitting - Level of Assistance: 5: Stand by assistance (supervision) Static Standing Balance Static Standing - Balance Support: No upper extremity supported Static Standing - Level of Assistance: 4: Min assist Dynamic Standing Balance Dynamic Standing - Balance Support: No upper extremity supported Dynamic Standing - Level of Assistance: 3: Mod assist Dynamic Standing - Comments: with transfers only Extremity Assessment  RLE Assessment RLE Assessment: Exceptions to [facility] RLE Strength Right Hip Flexion: 3-/5 Right Hip  ABduction: 4-/5 Right Hip ADduction: 4-/5 Right Knee Flexion: 4-/5 Right Knee Extension: 3+/5 Right Ankle Dorsiflexion: 4-/5 Right Ankle Plantar Flexion: 4-/5 LLE Assessment LLE Assessment: Exceptions to [facility] LLE Strength Left Hip Flexion: 3-/5 Left Hip ABduction: 4-/5 Left Hip ADduction: 3+/5 Left Knee Flexion: 4-/5 Left Knee Extension: 3+/5 Left Ankle Dorsiflexion: 4-/5 Left Ankle Plantar Flexion: 4-/5  Care Tool Care Tool Bed Mobility Roll left and right activity   Roll left and right assist level: Supervision/Verbal cueing    Sit to lying activity        Lying to sitting edge of bed activity   Lying to sitting edge of bed assist level: Supervision/Verbal cueing     Care Tool Transfers Sit to stand transfer   Sit to stand assist level: Moderate Assistance - Patient 50 - 74%    Chair/bed transfer   Chair/bed transfer assist level: Moderate Assistance - Patient 50 - 74%     Psychologist, counselling transfer activity did not occur: Safety/medical concerns (fatigue, weakness, decreased balance/postural control)        Care Tool Locomotion Ambulation Ambulation activity did not occur: Safety/medical concerns (fatigue, weakness, decreased balance/postural control)        Walk 10 feet activity Walk 10 feet activity did not occur: Safety/medical concerns (fatigue, weakness, decreased balance/postural control)       Walk 50 feet with 2 turns activity Walk 50 feet with 2 turns activity did not occur: Safety/medical concerns (fatigue, weakness, decreased balance/postural control)      Walk 150 feet activity Walk 150 feet activity did not occur: Safety/medical concerns (fatigue, weakness, decreased balance/postural control)      Walk 10 feet on uneven surfaces activity Walk 10 feet on uneven surfaces activity did not occur: Safety/medical concerns (fatigue, weakness, decreased balance/postural control)      Stairs Stair activity did not occur:  Safety/medical concerns (fatigue, weakness, decreased balance/postural control)        Walk up/down 1 step activity Walk up/down 1 step or curb (drop down) activity did not occur: Safety/medical concerns (fatigue, weakness, decreased balance/postural control)     Walk up/down 4 steps activity did not occuR: Safety/medical concerns (fatigue, weakness, decreased balance/postural control)  Walk up/down 4 steps activity      Walk up/down 12 steps activity Walk up/down 12 steps activity did not occur: Safety/medical concerns (fatigue, weakness, decreased balance/postural control)      Pick  up small objects from floor Pick up small object from the floor (from standing position) activity did not occur: Safety/medical concerns (fatigue, weakness, decreased balance/postural control)      Wheelchair Will patient use wheelchair at discharge?: Yes Type of Wheelchair: Manual Wheelchair activity did not occur: Safety/medical concerns (fatigue, weakness, decreased balance/postural control)      Wheel 50 feet with 2 turns activity Wheelchair 50 feet with 2 turns activity did not occur: Safety/medical concerns (fatigue, weakness, decreased balance/postural control)    Wheel 150 feet activity Wheelchair 150 feet activity did not occur: Safety/medical concerns (fatigue, weakness, decreased balance/postural control)      Refer to Care Plan for [place] 1 PT Short Term Goal 1 (Week 1): pt will transfer bed<>chair with LRAD and supervision PT Short Term Goal 2 (Week 1): pt will ambulate 3f with LRAD and CGA PT Short Term Goal 3 (Week 1): pt will perform simulated car transfer with LRAD and CGA  Recommendations for other services: None   Skilled Therapeutic Intervention Evaluation completed (see details above and below) with education on PT POC and goals and individual treatment initiated with focus on functional mobility/transfers, generalized strengthening, dynamic  standing balance/coordination, and improved activity tolerance. Received pt supine in bed, pt educated on PT evaluation, CIR policies, and therapy schedule and agreeable. Pt denied any pain during session. Provided pt with 20x18 manual WC, cushion, and legrests. Pt transferred semi-reclined<>sitting EOB with supervision and transferred bed<>WC without AD and mod A - difficulty standing from low surface. RN present to change ostomy bag as current bag broke. Therapist assisted RN with locating correct ostomy bag and with setup/cleanup. Pt transferred sit<>stand with mod A pulling up on bedrail and doffed soiled pants/underwear with max A and donned clean underwear/pants in standing with max A. Pt able to balance on one leg to step each LE in/out of clothing. [facility] Concluded session with pt sitting in WC, needs within reach, and chair pad alarm on. Safety plan updated.   Mobility Bed Mobility Bed Mobility: Rolling Right;Right Sidelying to Sit Rolling Right: Supervision/verbal cueing Right Sidelying to Sit: Supervision/Verbal cueing Transfers Transfers: Sit to Stand;Stand to Sit;Stand Pivot Transfers Sit to Stand: Moderate Assistance - Patient 50-74% Stand to Sit: Minimal Assistance - Patient > 75% Stand Pivot Transfers: Moderate Assistance - Patient 50 - 74% Stand Pivot Transfer Details: Verbal cues for sequencing Stand Pivot Transfer Details (indicate cue type and reason): verbal cues for hand placement when turning and when standing Transfer (Assistive device): None Locomotion  Gait Ambulation: No Gait Gait: No Stairs / Additional Locomotion Stairs: No Wheelchair Mobility Wheelchair Mobility: No   Discharge Criteria: Patient will be discharged from PT if patient refuses treatment 3 consecutive times without medical reason, if treatment goals not met, if there is a change in medical status, if patient makes no progress towards goals or if patient  is discharged from hospital.  The above assessment, treatment plan, treatment alternatives and goals were discussed and mutually agreed upon: by patient  AAlfonse AlpersPT, DPT  06/26/2021, 10:47 AM

## 2021-06-26 NOTE — Progress Notes (Signed)
Spoke with Financial controller. She will give AD to Patient and when is ready or  need assistance Chaplain will be call.   Jaclynn Major, [place], [facility], Pager [phone]

## 2021-06-26 NOTE — Progress Notes (Signed)
Inpatient Rehabilitation Care Coordinator Assessment and Plan Patient Details  Name: Russell Gomez MRN: [id number] Date of Birth: 08-02-1961  Today's Date: 06/26/2021  Hospital Problems: Principal Problem:   Debility  Past Medical History:  Past Medical History:  Diagnosis Date   Arthritis    on operative finger   Dental crowns present    Diabetes type 1, uncontrolled ([place])    IDDM (Dr. Eddie Dibbles @ [facility])   Esophageal reflux    Goiter 04/2012   no current med, has yearly monitoring   High cholesterol    HTN (hypertension)    under control with meds, has been on med x 2 yr.   Past Surgical History:  Past Surgical History:  Procedure Laterality Date   APPENDECTOMY  06/14/2021   Procedure: APPENDECTOMY;  Surgeon: Herbert Pun, MD;  Location: [facility];  Service: General;;   COLON RESECTION SIGMOID  06/14/2021   Procedure: COLON RESECTION SIGMOID WITH END COLOSTOMY CREATION;  Surgeon: Herbert Pun, MD;  Location: [facility];  Service: General;;   DISTAL INTERPHALANGEAL JOINT FUSION Right 03/05/2014   Procedure: DEBRIDEMENT (DIP) DISTAL INTERPHALANGEAL RIGHT MIDDLE FINGER;  Surgeon: Wynonia Sours, MD;  Location: [place];  Service: Orthopedics;  Laterality: Right;   KNEE ARTHROSCOPY     LAPAROTOMY N/A 06/14/2021   Procedure: EXPLORATORY LAPAROTOMY;  Surgeon: Herbert Pun, MD;  Location: [facility];  Service: General;  Laterality: N/A;   MASS EXCISION Right 03/05/2014   Procedure: EXCISION CYST ;  Surgeon: Wynonia Sours, MD;  Location: [place];  Service: Orthopedics;  Laterality: Right;  ANESTHESIA: IV REGINAL FAB   OPEN REDUCTION NASAL FRACTURE  12/26/2008   with closure of nasal lac.   ORIF DISTAL RADIUS FRACTURE Right 12/26/2008   ORIF WRIST FRACTURE Left 05/12/2016   takedown of nonunion/malunion and OPEN REDUCTION INTERNAL FIXATION (ORIF) WRIST FRACTURE;  Surgeon: Corky Mull, MD   PERCUTANEOUS PINNING Left 02/06/2013   Procedure: PINNING  PIP OF THE LEFT MIDDLE FINGER ;  Surgeon: Wynonia Sours, MD;  Location: [place];  Service: Orthopedics;  Laterality: Left;   SKIN GRAFT SPLIT THICKNESS LEG / FOOT Right 1985   thigh after trauma vs machine at work   TRIGGER FINGER RELEASE  11/28/2012   Procedure: RELEASE TRIGGER FINGER/A-1 PULLEY;  Surgeon: Wynonia Sours, MD;  Laterality: Left;  EXCISION MASS LEFT RING FINGER, RELEASE A-1 PULLEY LEFT RING FINGER (ganglion cyst)   Social History:  reports that he has been smoking cigarettes. He has a 20.00 pack-year smoking history. He has never used smokeless tobacco. He reports current alcohol use of about 7.0 standard drinks per week. He reports that he does not use drugs.  Family / Support Systems Marital Status: Separated How Long?: 2 years Patient Roles: Spouse, Parent Spouse/Significant Other: Cecille Rubin [id number] [phone] Children: Michael-son [phone] Anticipated Caregiver: Wife and son Ability/Limitations of Caregiver: Wife is separated from him-2 years and son work s days Caregiver Availability: Intermittent Family Dynamics: Still close with wife reports will get back together if he quits drinking which he is. Their son is supportive but works days. Wife is a recovering alcoholic and can not put her soberity at risk if pt continues to drink  Social History Preferred language: English Religion: None Cultural Background: No issues Education: HS Read: Yes Write: Yes Employment Status: Unemployed Public relations account executive Issues: No issues Guardian/Conservator: None-according to MD pt is capable of making his own decisions while here. He hopes to go home  soon   Abuse/Neglect Abuse/Neglect Assessment Can Be Completed: Yes Physical Abuse: Denies Verbal Abuse: Denies Sexual Abuse: Denies Exploitation of patient/patient's resources: Denies Self-Neglect: Denies  Emotional Status Pt's affect, behavior and adjustment status: Pt is motivated to recover  and get back home. He reports I have not been home in 2 1/2 weeks. Discussed will need to learn ostomy care before he goes. Recent Psychosocial Issues: other health issues-health insurance will term 07/08/2021 Psychiatric History: History of depression is now taking medicaitons which he finds helpful and with his wife back in the picture it helps him. He feels better not drinking also Substance Abuse History: ETOH plans not to drink again. He is aware of the resources and feels has not desire for a drink. He will go to [place] with wife  Patient / Family Perceptions, Expectations & Goals Pt/Family understanding of illness & functional limitations: Pt is able to explain his health issues and ostomy he is willing to learn it while here. He talks with the MD and feels he will be here short term and MD told him 5-7 days. Premorbid pt/family roles/activities: Husband, father, friend, etc Anticipated changes in roles/activities/participation: resume Pt/family expectations/goals: Pt states: " I want to go home soon, I know I need to be up moving and do my care."  Pt feels his wife will be helping him will need to confirm with her  [employer]: None Premorbid Home Care/DME Agencies: None Transportation available at discharge: Family-pt drove PTA Resource referrals recommended: Neuropsychology  Discharge Planning Living Arrangements: Alone Support Systems: Children, Spouse/significant other Type of Residence: Private residence Insurance Resources: Multimedia programmer (specify) Nurse, mental health) Financial Resources: Family Support, Employment Financial Screen Referred: Yes Living Expenses: Rent Money Management: Patient Does the patient have any problems obtaining your medications?: No (insurance will term 8/31 will have issues then) Home Management: Self family may help now Patient/Family Preliminary Plans: Return home with wife and son assisting, will only be intermittent assist. He will  need to learn ostomy care and be mod/i at discharge. Will await team evaluations. Care Coordinator Barriers to Discharge: Decreased caregiver support, Insurance for SNF coverage, Medication compliance Care Coordinator Anticipated Follow Up Needs: HH/OP  Clinical Impression Pleasant gentleman who has been quite ill but is feeling better now and wants to go home soon. He will be alone at home and will need to be mod/I-he feels his wife will be assisting, will need to confirm. Due to pt's insurance terming and that it is [employer] it will be difficult to get home health agency to see at discharge. Will await team's evaluations.  Elease Hashimoto 06/26/2021, 10:08 AM

## 2021-06-26 NOTE — Progress Notes (Signed)
Inpatient [facility] Individual Statement of Services  Patient Name:  Russell Gomez  Date:  06/26/2021  Welcome to the [facility].  Our goal is to provide you with an individualized program based on your diagnosis and situation, designed to meet your specific needs.  With this comprehensive rehabilitation program, you will be expected to participate in at least 3 hours of rehabilitation therapies Monday-Friday, with modified therapy programming on the weekends.  Your rehabilitation program will include the following services:  Physical Therapy (PT), Occupational Therapy (OT), Speech Therapy (ST), 24 hour per day rehabilitation nursing, Therapeutic Recreaction (TR), Neuropsychology, Care Coordinator, Rehabilitation Medicine, Nutrition Services, and Pharmacy Services  Weekly team conferences will be held on Tuesday to discuss your progress.  Your Inpatient Rehabilitation Care Coordinator will talk with you frequently to get your input and to update you on team discussions.  Team conferences with you and your family in attendance may also be held.  Expected length of stay: 10-14 days  Overall anticipated outcome: supervision-mod/I level  Depending on your progress and recovery, your program may change. Your Inpatient Rehabilitation Care Coordinator will coordinate services and will keep you informed of any changes. Your Inpatient Rehabilitation Care Coordinator's name and contact numbers are listed  below.  The following services may also be recommended but are not provided by the [facility] will be made to provide these services after discharge if needed.  Arrangements include referral to agencies that provide these services.  Your insurance has been verified to be:  [place] Your primary doctor is:  Jodi Marble  Pertinent information will be shared with your doctor and your insurance company.  Inpatient Rehabilitation Care Coordinator:  Ovidio Kin, [place] or ([phone]  Information discussed with and copy given to patient by: Elease Hashimoto, 06/26/2021, 10:10 AM

## 2021-06-26 NOTE — Progress Notes (Addendum)
PROGRESS NOTE   Subjective/Complaints: C/o bilateral lower extremity swelling CBGs very elevated Vitamin D low Felt therapy went well, but tiring  ROS: +bilateral lower extremity swelling  Objective:   No results found. Recent Labs    06/24/21 0500 06/26/21 0702  WBC 14.0* 12.5*  HGB 9.7* 9.0*  HCT 28.2* 27.1*  PLT 443* 538*   Recent Labs    06/25/21 0410 06/26/21 0702  NA 134* 135  K 4.0 4.0  CL 102 102  CO2 27 28  GLUCOSE 368* 237*  BUN 9 <5*  CREATININE 0.64 0.65  CALCIUM 7.2* 7.8*    Intake/Output Summary (Last 24 hours) at 06/26/2021 1606 Last data filed at 06/26/2021 0953 Gross per 24 hour  Intake 420 ml  Output 1475 ml  Net -1055 ml        Physical Exam: Vital Signs Blood pressure (!) 128/57, pulse 100, temperature 98.8 F (37.1 C), temperature source Oral, resp. rate 19, height '6\' 2"'$  (1.88 m), weight 108.3 kg, SpO2 95 %. Gen: no distress, normal appearing HEENT: oral mucosa pink and moist, NCAT Cardio: Reg rate Chest: normal effort, normal rate of breathing Abd: soft, non-distended Extremities: both lower extremities edematous Abdominal:     Comments: Colostomy intact  Neurological:     Comments: Patient is alert.  Makes eye contact with examiner.  He appears motivated.  Follows simple commands.  Provides name and age.  MSK: Lower extremities 4-/5 strength Psych: bright and motivated   Assessment/Plan: 1. Functional deficits which require 3+ hours per day of interdisciplinary therapy in a comprehensive inpatient rehab setting. Physiatrist is providing close team supervision and 24 hour management of active medical problems listed below. Physiatrist and rehab team continue to assess barriers to discharge/monitor patient progress toward functional and medical goals  Care Tool:  Bathing    Body parts bathed by patient: Right arm, Left arm, Chest, Abdomen, Front perineal area, Right  upper leg, Left upper leg, Face   Body parts bathed by helper: Buttocks, Left lower leg, Right lower leg     Bathing assist Assist Level: Moderate Assistance - Patient 50 - 74%     Upper Body Dressing/Undressing Upper body dressing   What is the patient wearing?: Pull over shirt    Upper body assist Assist Level: Supervision/Verbal cueing    Lower Body Dressing/Undressing Lower body dressing      What is the patient wearing?: Pants     Lower body assist Assist for lower body dressing: Maximal Assistance - Patient 25 - 49% (power up into standing)     Toileting Toileting    Toileting assist Assist for toileting: Moderate Assistance - Patient 50 - 74%     Transfers Chair/bed transfer  Transfers assist     Chair/bed transfer assist level: Moderate Assistance - Patient 50 - 74%     Locomotion Ambulation   Ambulation assist   Ambulation activity did not occur: Safety/medical concerns (fatigue, weakness, decreased balance/postural control)          Walk 10 feet activity   Assist  Walk 10 feet activity did not occur: Safety/medical concerns (fatigue, weakness, decreased balance/postural control)  Walk 50 feet activity   Assist Walk 50 feet with 2 turns activity did not occur: Safety/medical concerns (fatigue, weakness, decreased balance/postural control)         Walk 150 feet activity   Assist Walk 150 feet activity did not occur: Safety/medical concerns (fatigue, weakness, decreased balance/postural control)         Walk 10 feet on uneven surface  activity   Assist Walk 10 feet on uneven surfaces activity did not occur: Safety/medical concerns (fatigue, weakness, decreased balance/postural control)         Wheelchair     Assist Will patient use wheelchair at discharge?: Yes Type of Wheelchair: Manual Wheelchair activity did not occur: Safety/medical concerns (fatigue, weakness, decreased balance/postural control)          Wheelchair 50 feet with 2 turns activity    Assist    Wheelchair 50 feet with 2 turns activity did not occur: Safety/medical concerns (fatigue, weakness, decreased balance/postural control)       Wheelchair 150 feet activity     Assist  Wheelchair 150 feet activity did not occur: Safety/medical concerns (fatigue, weakness, decreased balance/postural control)       Blood pressure (!) 128/57, pulse 100, temperature 98.8 F (37.1 C), temperature source Oral, resp. rate 19, height '6\' 2"'$  (1.88 m), weight 108.3 kg, SpO2 95 %.    Medical Problem List and Plan: 1.   Debility secondary to diverticulosis with perforation status post Jeanette Caprice procedure/colostomy 06/14/2021             -patient may shower             -ELOS/Goals: 5-7 days modI  Initial CIR evaluations today 2.  Antithrombotics: -DVT/anticoagulation: Eliquis [employer]: Other (comment)             -antiplatelet therapy: N/A 3. Pain Management: Tylenol as needed 4. Mood: Valium 2.5 mg every 6 hours.  Provide Emotional support             -antipsychotic agents: N/A 5. Neuropsych: This patient is capable of making decisions on his own behalf. 6. Colostomy: Initiate colostomy education. Routine skin checks 7. Fluids/Electrolytes/Nutrition: Routine in and outs with follow-up chemistries. Check vitamin D level tomorrow.  8.  Acute blood loss anemia.Follow up CBC 9.  PAF/hypotension.  Amiodarone 200 mg daily, Cardizem 180 mg daily and continue Eliquis 5 mg twice daily.  Hold ProAmatine 10 mg 3 times daily 10.  Alcohol withdrawal with delirium tremors.  Patient initially with CIWA protocol.  Provide counseling. Check magnesium level tomorrow.  11.  Diabetes mellitus.  Latest hemoglobin A1c 8.2 Semglee 15 units nightly.  Diabetic teaching 12.  Aspiration pneumonia.  Complete course of Augmentin 13.Tobacco abuse .Nicoderm patch.Provide counseling 14. Bilateral lower extremity swelling: compression garments, elevate,  ice, increase lasix to '40mg'$  (Cr normal) 15. Vitamin D deficiency: supplement 50,000U ergocalciferol 50,000U once per week for 7 weeks 16. Magnesium deficiency: 2 grams IV magnesium supplemented  LOS: 1 days A FACE TO FACE EVALUATION WAS PERFORMED  Martha Clan P Everline Mahaffy 06/26/2021, 4:06 PM

## 2021-06-26 NOTE — Progress Notes (Signed)
Inpatient Rehabilitation Medication Review by a Pharmacist   A complete drug regimen review was completed for this patient to identify any potential clinically significant medication issues. See pharmacist medication review progress note on 06/25/21.     Clinically significant medication issues were identified that warrant physician communication and completion of prescribed/recommended actions by midnight of the next day:  Yes  Midodrine (Proamatine) '10mg'$  po TID taking prior to the Inpatient rehab admission.   Admission progress note per Dr. Ranell Patrick indicates plan to continue the Proamatine '10mg'$  TID.  However, no order entered for this medication.  Discussed with Dr. Ranell Patrick.   Name of provider notified for urgent issues identified:  Dr. Leeroy Cha  Provider Method of Notification:  secure chat message   Pharmacist comments:  Dr. Ranell Patrick said plan is to hold the midodrine.   Time spent performing this drug regimen review (minutes):  10 mins   Nicole Cella, [facility] Clinical Pharmacist 06/25/2021 5:25 PM Please check AMION for all [facility] phone numbers After 10:00 PM, call [facility] [phone]

## 2021-06-27 LAB — GLUCOSE, CAPILLARY
Glucose-Capillary: 316 mg/dL — ABNORMAL HIGH (ref 70–99)
Glucose-Capillary: 173 mg/dL — ABNORMAL HIGH (ref 70–99)
Glucose-Capillary: 260 mg/dL — ABNORMAL HIGH (ref 70–99)
Glucose-Capillary: 344 mg/dL — ABNORMAL HIGH (ref 70–99)

## 2021-06-27 MED ORDER — TORSEMIDE 20 MG PO TABS
20.0000 mg | ORAL_TABLET | Freq: Every day | ORAL | Status: DC
  Administered 2021-06-27 – 2021-07-01 (×5): 20 mg via ORAL
  Filled 2021-06-27 (×5): qty 1

## 2021-06-27 MED ORDER — CAMPHOR-MENTHOL 0.5-0.5 % EX LOTN
TOPICAL_LOTION | CUTANEOUS | Status: DC | PRN
  Filled 2021-06-27: qty 222

## 2021-06-27 NOTE — Progress Notes (Signed)
PROGRESS NOTE   Subjective/Complaints: Echo from February 2022 reviewed, mild to moderate mitral regurg otherwise no abnormalities normal ejection fraction Sleeping but awakens to voice, no complaints today other than lower extremity swelling. ROS: +bilateral lower extremity swelling, negative chest pain shortness of breath nausea vomiting diarrhea constipation.  Objective:   No results found. Recent Labs    06/26/21 0702  WBC 12.5*  HGB 9.0*  HCT 27.1*  PLT 538*    Recent Labs    06/25/21 0410 06/26/21 0702  NA 134* 135  K 4.0 4.0  CL 102 102  CO2 27 28  GLUCOSE 368* 237*  BUN 9 <5*  CREATININE 0.64 0.65  CALCIUM 7.2* 7.8*     Intake/Output Summary (Last 24 hours) at 06/27/2021 1319 Last data filed at 06/27/2021 1300 Gross per 24 hour  Intake 760 ml  Output 3925 ml  Net -3165 ml         Physical Exam: Vital Signs Blood pressure 130/81, pulse 83, temperature 98.6 F (37 C), temperature source Oral, resp. rate 20, height '6\' 2"'$  (1.88 m), weight 108.2 kg, SpO2 100 %.  General: No acute distress Mood and affect are appropriate Heart: Regular rate and rhythm no rubs murmurs or extra sounds Lungs: Clear to auscultation, breathing unlabored, no rales or wheezes Abdomen: Positive bowel sounds, soft nontender to palpation, nondistended Extremities: No clubbing, cyanosis, or edema Skin: No evidence of breakdown, no evidence of rash   Abdominal:     Comments: Colostomy intact  Neurological:     Comments: Patient is alert.  Makes eye contact with examiner.  He appears motivated.  Follows simple commands.  Provides name and age.  MSK: Lower extremities 4-/5 strength Psych: bright and motivated   Assessment/Plan: 1. Functional deficits which require 3+ hours per day of interdisciplinary therapy in a comprehensive inpatient rehab setting. Physiatrist is providing close team supervision and 24 hour management  of active medical problems listed below. Physiatrist and rehab team continue to assess barriers to discharge/monitor patient progress toward functional and medical goals  Care Tool:  Bathing    Body parts bathed by patient: Right arm, Left arm   Body parts bathed by helper: Buttocks     Bathing assist Assist Level: Moderate Assistance - Patient 50 - 74%     Upper Body Dressing/Undressing Upper body dressing   What is the patient wearing?: Pull over shirt    Upper body assist Assist Level: Supervision/Verbal cueing    Lower Body Dressing/Undressing Lower body dressing      What is the patient wearing?: Pants     Lower body assist Assist for lower body dressing: Maximal Assistance - Patient 25 - 49%     Toileting Toileting    Toileting assist Assist for toileting: Moderate Assistance - Patient 50 - 74%     Transfers Chair/bed transfer  Transfers assist     Chair/bed transfer assist level: Moderate Assistance - Patient 50 - 74%     Locomotion Ambulation   Ambulation assist   Ambulation activity did not occur: Safety/medical concerns  Assist level: Minimal Assistance - Patient > 75% Assistive device: Walker-rolling Max distance: 90'   Walk 10 feet  activity   Assist  Walk 10 feet activity did not occur: Safety/medical concerns  Assist level: Minimal Assistance - Patient > 75% Assistive device: Walker-rolling   Walk 50 feet activity   Assist Walk 50 feet with 2 turns activity did not occur: Safety/medical concerns  Assist level: Minimal Assistance - Patient > 75% Assistive device: Walker-rolling    Walk 150 feet activity   Assist Walk 150 feet activity did not occur: Safety/medical concerns         Walk 10 feet on uneven surface  activity   Assist Walk 10 feet on uneven surfaces activity did not occur: Safety/medical concerns         Wheelchair     Assist Will patient use wheelchair at discharge?: No Type of Wheelchair:  Manual Wheelchair activity did not occur: Safety/medical concerns  Wheelchair assist level: Supervision/Verbal cueing Max wheelchair distance: 100    Wheelchair 50 feet with 2 turns activity    Assist    Wheelchair 50 feet with 2 turns activity did not occur: Safety/medical concerns   Assist Level: Supervision/Verbal cueing   Wheelchair 150 feet activity     Assist  Wheelchair 150 feet activity did not occur: Safety/medical concerns       Blood pressure 130/81, pulse 83, temperature 98.6 F (37 C), temperature source Oral, resp. rate 20, height '6\' 2"'$  (1.88 m), weight 108.2 kg, SpO2 100 %.    Medical Problem List and Plan: 1.   Debility secondary to diverticulosis with perforation status post Jeanette Caprice procedure/colostomy 06/14/2021             -patient may shower             -ELOS/Goals: 5-7 days modI  Initial CIR evaluations today 2.  Antithrombotics: -DVT/anticoagulation: Eliquis [employer]: Other (comment)             -antiplatelet therapy: N/A 3. Pain Management: Tylenol as needed 4. Mood: Valium 2.5 mg every 6 hours.  Provide Emotional support             -antipsychotic agents: N/A 5. Neuropsych: This patient is capable of making decisions on his own behalf. 6. Colostomy: Initiate colostomy education. Routine skin checks 7. Fluids/Electrolytes/Nutrition: Routine in and outs with follow-up chemistries. Check vitamin D level tomorrow.  8.  Acute blood loss anemia.Follow up CBC 9.  PAF/ Amiodarone 200 mg daily, Cardizem 180 mg daily and continue Eliquis 5 mg twice daily.   Vitals:   06/27/21 0352 06/27/21 1000  BP: 140/74 130/81  Pulse: 80 83  Resp: 18 20  Temp: 98.1 F (36.7 C) 98.6 F (37 C)  SpO2: 99% 100%   On Lasix '40mg'$  daily as well  10.  Alcohol withdrawal with delirium tremors.  Patient initially with CIWA protocol.  Provide counseling. Check magnesium level tomorrow.  11.  Diabetes mellitus.  Latest hemoglobin A1c 8.2 Semglee 15 units nightly.   Diabetic teaching CBG (last 3)  Recent Labs    06/26/21 2126 06/27/21 0621 06/27/21 1128  GLUCAP 325* 316* 173*    12.  Aspiration pneumonia.  Complete course of Augmentin 13.Tobacco abuse .Nicoderm patch.Provide counseling 14. Bilateral lower extremity swelling: compression garments, elevate, ice, increase lasix to '40mg'$  (Cr normal) 15. Vitamin D deficiency: supplement 50,000U ergocalciferol 50,000U once per week for 7 weeks 16. Magnesium deficiency: 2 grams IV magnesium supplemented 17.  Bilateral lower extremity edema secondary to low albumin, on Lasix however this is protein bound will use torsemide instead LOS: 2 days A FACE TO  [place] EVALUATION WAS PERFORMED  Charlett Blake 06/27/2021, 1:19 PM

## 2021-06-28 LAB — GLUCOSE, CAPILLARY
Glucose-Capillary: 85 mg/dL (ref 70–99)
Glucose-Capillary: 454 mg/dL — ABNORMAL HIGH (ref 70–99)
Glucose-Capillary: 507 mg/dL (ref 70–99)
Glucose-Capillary: 259 mg/dL — ABNORMAL HIGH (ref 70–99)

## 2021-06-28 LAB — GLUCOSE, RANDOM: Glucose, Bld: 481 mg/dL — ABNORMAL HIGH (ref 70–99)

## 2021-06-28 MED ORDER — INSULIN NPH (HUMAN) (ISOPHANE) 100 UNIT/ML ~~LOC~~ SUSP
10.0000 [IU] | Freq: Two times a day (BID) | SUBCUTANEOUS | Status: DC
  Administered 2021-06-28 – 2021-06-29 (×2): 10 [IU] via SUBCUTANEOUS
  Filled 2021-06-28: qty 10

## 2021-06-28 MED ORDER — INSULIN REGULAR HUMAN 100 UNIT/ML IJ SOLN
0.0000 [IU] | Freq: Every day | INTRAMUSCULAR | Status: DC
  Administered 2021-06-28 – 2021-06-30 (×3): 3 [IU] via SUBCUTANEOUS
  Filled 2021-06-28 (×2): qty 3

## 2021-06-28 MED ORDER — INSULIN REGULAR HUMAN 100 UNIT/ML IJ SOLN
0.0000 [IU] | Freq: Three times a day (TID) | INTRAMUSCULAR | Status: DC
Start: 2021-06-28 — End: 2021-07-01
  Administered 2021-06-28: 9 [IU] via SUBCUTANEOUS
  Administered 2021-06-29: 7 [IU] via SUBCUTANEOUS
  Administered 2021-06-29: 3 [IU] via SUBCUTANEOUS
  Administered 2021-06-29: 9 [IU] via SUBCUTANEOUS
  Administered 2021-06-30 (×2): 5 [IU] via SUBCUTANEOUS
  Administered 2021-07-01: 9 [IU] via SUBCUTANEOUS
  Filled 2021-06-28 (×2): qty 3

## 2021-06-28 MED ORDER — INSULIN REGULAR HUMAN 100 UNIT/ML IJ SOLN
10.0000 [IU] | Freq: Once | INTRAMUSCULAR | Status: AC
  Administered 2021-06-28: 10 [IU] via SUBCUTANEOUS

## 2021-06-28 MED ORDER — POLYETHYLENE GLYCOL 3350 17 G PO PACK: 17.0000 g | PACK | Freq: Every day | ORAL | Status: DC | PRN

## 2021-06-28 MED ORDER — METFORMIN HCL 500 MG PO TABS: 500.0000 mg | ORAL_TABLET | Freq: Every day | ORAL | Status: DC

## 2021-06-28 MED ORDER — SIMETHICONE 80 MG PO CHEW
80.0000 mg | CHEWABLE_TABLET | Freq: Four times a day (QID) | ORAL | Status: DC | PRN
  Administered 2021-06-28 – 2021-07-01 (×5): 80 mg via ORAL
  Filled 2021-06-28 (×5): qty 1

## 2021-06-28 MED ORDER — NON FORMULARY: 10.0000 [IU] | Freq: Once | Status: DC

## 2021-06-28 NOTE — Progress Notes (Signed)
Physical Therapy Session Note  Patient Details  Name: Russell Gomez MRN: [id number] Date of Birth: 12/25/1960  Today's Date: 06/28/2021 PT Individual Time: 1100-1130 PT Individual Time Calculation (min): 30 min   Short Term Goals: Week 1:  PT Short Term Goal 1 (Week 1): pt will transfer bed<>chair with LRAD and supervision PT Short Term Goal 2 (Week 1): pt will ambulate 36f with LRAD and CGA PT Short Term Goal 3 (Week 1): pt will perform simulated car transfer with LRAD and CGA  Skilled Therapeutic Interventions/Progress Updates: Pt presents sitting in w/c and agreeable to therapy.  Pt wheeled to main gym for time conservation.  Pt requires mod A for sit to stand transfers although decreases slightly w/ verbal cueing for sequencing, especially forward lean.  Pt educated on breathing techniques to improve transfers as well as decrease prssure abd incision/colostomy.  Pt amb multiple trials w/ RW and CGA, including turns to return to seat.  Pt performed sit to stand from mat table, but increased assist.  Pt then used extenders on mat and encouraged forward lean and improved performance.  Pt returned to room and remained in w/c w/ seat alarm on and all needs in reach.     Therapy Documentation Precautions:  Precautions Precautions: Fall Precaution Comments: ostomy. abdominal incision Restrictions Weight Bearing Restrictions: No General:   Vital Signs:   Pain:   Mobility:   Locomotion :    Trunk/Postural Assessment :    Balance:   Exercises:   Other Treatments:      Therapy/Group: Individual Therapy  JLadoris Gene8/21/2022, 11:31 AM

## 2021-06-28 NOTE — Progress Notes (Signed)
PROGRESS NOTE   Subjective/Complaints: Discussed diabetic management at home, patient does not tolerate insulin Aspartate,uses Novulin N and Novolin R at home  ROS: +bilateral lower extremity swelling, negative chest pain shortness of breath nausea vomiting diarrhea constipation.  Objective:   No results found. Recent Labs    06/26/21 0702  WBC 12.5*  HGB 9.0*  HCT 27.1*  PLT 538*    Recent Labs    06/26/21 0702  NA 135  K 4.0  CL 102  CO2 28  GLUCOSE 237*  BUN <5*  CREATININE 0.65  CALCIUM 7.8*     Intake/Output Summary (Last 24 hours) at 06/28/2021 1229 Last data filed at 06/28/2021 0900 Gross per 24 hour  Intake 856 ml  Output 5450 ml  Net -4594 ml         Physical Exam: Vital Signs Blood pressure 125/67, pulse 74, temperature 98.1 F (36.7 C), temperature source Oral, resp. rate 18, height '6\' 2"'$  (1.88 m), weight 102.5 kg, SpO2 98 %.  General: No acute distress Mood and affect are appropriate Heart: Regular rate and rhythm no rubs murmurs or extra sounds Lungs: Clear to auscultation, breathing unlabored, no rales or wheezes Abdomen: Positive bowel sounds, soft nontender to palpation, nondistended Extremities: No clubbing, cyanosis, or edema Skin: No evidence of breakdown, no evidence of rash Abdominal:     Comments: Colostomy intact  Neurological:     Comments: Patient is alert.  Makes eye contact with examiner.  He appears motivated.  Follows simple commands.  Provides name and age.  MSK: Lower extremities 4-/5 strength Psych: bright and motivated   Assessment/Plan: 1. Functional deficits which require 3+ hours per day of interdisciplinary therapy in a comprehensive inpatient rehab setting. Physiatrist is providing close team supervision and 24 hour management of active medical problems listed below. Physiatrist and rehab team continue to assess barriers to discharge/monitor patient progress  toward functional and medical goals  Care Tool:  Bathing    Body parts bathed by patient: Right arm, Left arm   Body parts bathed by helper: Buttocks     Bathing assist Assist Level: Moderate Assistance - Patient 50 - 74%     Upper Body Dressing/Undressing Upper body dressing   What is the patient wearing?: Pull over shirt    Upper body assist Assist Level: Supervision/Verbal cueing    Lower Body Dressing/Undressing Lower body dressing      What is the patient wearing?: Pants     Lower body assist Assist for lower body dressing: Maximal Assistance - Patient 25 - 49%     Toileting Toileting    Toileting assist Assist for toileting: Moderate Assistance - Patient 50 - 74%     Transfers Chair/bed transfer  Transfers assist     Chair/bed transfer assist level: Moderate Assistance - Patient 50 - 74%     Locomotion Ambulation   Ambulation assist   Ambulation activity did not occur: Safety/medical concerns  Assist level: Contact Guard/Touching assist Assistive device: Walker-rolling Max distance: 90   Walk 10 feet activity   Assist  Walk 10 feet activity did not occur: Safety/medical concerns  Assist level: Contact Guard/Touching assist Assistive device: Walker-rolling  Walk 50 feet activity   Assist Walk 50 feet with 2 turns activity did not occur: Safety/medical concerns  Assist level: Contact Guard/Touching assist Assistive device: Walker-rolling    Walk 150 feet activity   Assist Walk 150 feet activity did not occur: Safety/medical concerns         Walk 10 feet on uneven surface  activity   Assist Walk 10 feet on uneven surfaces activity did not occur: Safety/medical concerns         Wheelchair     Assist Will patient use wheelchair at discharge?: No Type of Wheelchair: Manual Wheelchair activity did not occur: Safety/medical concerns  Wheelchair assist level: Supervision/Verbal cueing Max wheelchair distance: 100     Wheelchair 50 feet with 2 turns activity    Assist    Wheelchair 50 feet with 2 turns activity did not occur: Safety/medical concerns   Assist Level: Supervision/Verbal cueing   Wheelchair 150 feet activity     Assist  Wheelchair 150 feet activity did not occur: Safety/medical concerns   Assist Level: Minimal Assistance - Patient > 75%   Blood pressure 125/67, pulse 74, temperature 98.1 F (36.7 C), temperature source Oral, resp. rate 18, height '6\' 2"'$  (1.88 m), weight 102.5 kg, SpO2 98 %.    Medical Problem List and Plan: 1.   Debility secondary to diverticulosis with perforation status post Jeanette Caprice procedure/colostomy 06/14/2021             -patient may shower             -ELOS/Goals: 5-7 days modI  Initial CIR evaluations today 2.  Antithrombotics: -DVT/anticoagulation: Eliquis [employer]: Other (comment)             -antiplatelet therapy: N/A 3. Pain Management: Tylenol as needed 4. Mood: Valium 2.5 mg every 6 hours.  Provide Emotional support             -antipsychotic agents: N/A 5. Neuropsych: This patient is capable of making decisions on his own behalf. 6. Colostomy: Initiate colostomy education. Routine skin checks 7. Fluids/Electrolytes/Nutrition: Routine in and outs with follow-up chemistries. Check vitamin D level tomorrow.  8.  Acute blood loss anemia.Follow up CBC 9.  PAF/ Amiodarone 200 mg daily, Cardizem 180 mg daily and continue Eliquis 5 mg twice daily.   Vitals:   06/27/21 1931 06/28/21 0353  BP: 131/66 125/67  Pulse: 90 74  Resp: 20 18  Temp: 98.1 F (36.7 C) 98.1 F (36.7 C)  SpO2: 97% 98%  On torsemide 10.  Alcohol withdrawal with delirium tremors.  Patient initially with CIWA protocol.  Provide counseling. Check magnesium level tomorrow.  11.  Diabetes mellitus.  Latest hemoglobin A1c 8.2 Semglee 15 units nightly.  Diabetic teaching CBG (last 3)  Recent Labs    06/27/21 2125 06/28/21 0614 06/28/21 1211  GLUCAP 344* 85 454*    Novolin N 10 units twice daily Will start Novolin R sliding scale, pharmacy to write  12.  Aspiration pneumonia.  Complete course of Augmentin 13.Tobacco abuse .Nicoderm patch.Provide counseling 14. Bilateral lower extremity swelling: compression garments, elevate, ice, i change furosemide to torsemide given low albumin, brisk diuresis yesterday recheck metabolic package in a.m. 15. Vitamin D deficiency: supplement 50,000U ergocalciferol 50,000U once per week for 7 weeks 16. Magnesium deficiency: 2 grams IV magnesium supplemented 17.  Bilateral lower extremity edema secondary to low albumin, was on Lasix however this is protein bound will use torsemide instead LOS: 3 days A FACE TO FACE EVALUATION WAS PERFORMED  Russell Gomez Russell Gomez 06/28/2021, 12:29 PM

## 2021-06-28 NOTE — Plan of Care (Signed)
  Problem: Consults Goal: RH GENERAL PATIENT EDUCATION Description: See Patient Education module for education specifics. Outcome: Progressing   Problem: RH BOWEL ELIMINATION Goal: RH STG MANAGE BOWEL WITH ASSISTANCE Description: STG Manage Bowel with  mod I Assistance. Outcome: Progressing   Problem: RH BLADDER ELIMINATION Goal: RH STG MANAGE BLADDER WITH ASSISTANCE Description: STG Manage Bladder With no Assistance Outcome: Progressing   Problem: RH SKIN INTEGRITY Goal: RH STG MAINTAIN SKIN INTEGRITY WITH ASSISTANCE Description: STG Maintain Skin Integrity With  min Assistance. Outcome: Progressing Goal: RH STG ABLE TO PERFORM INCISION/WOUND CARE W/ASSISTANCE Description: STG Able To Perform Incision/Wound Care With min Assistance. Outcome: Progressing   Problem: RH SAFETY Goal: RH STG ADHERE TO SAFETY PRECAUTIONS W/ASSISTANCE/DEVICE Description: STG Adhere to Safety Precautions With  cues Assistance/Device. Outcome: Progressing   Problem: RH PAIN MANAGEMENT Goal: RH STG PAIN MANAGED AT OR BELOW PT'S PAIN GOAL Description: At or below level 4 Outcome: Progressing   Problem: RH BOWEL ELIMINATION Goal: RH STG MANAGE BOWEL WITH ASSISTANCE Description: STG Manage Bowel with  mod I Assistance. Outcome: Progressing   Problem: RH BLADDER ELIMINATION Goal: RH STG MANAGE BLADDER WITH ASSISTANCE Description: STG Manage Bladder With no Assistance Outcome: Progressing   Problem: RH SKIN INTEGRITY Goal: RH STG MAINTAIN SKIN INTEGRITY WITH ASSISTANCE Description: STG Maintain Skin Integrity With  min Assistance. Outcome: Progressing Goal: RH STG ABLE TO PERFORM INCISION/WOUND CARE W/ASSISTANCE Description: STG Able To Perform Incision/Wound Care With min Assistance. Outcome: Progressing   Problem: RH SAFETY Goal: RH STG ADHERE TO SAFETY PRECAUTIONS W/ASSISTANCE/DEVICE Description: STG Adhere to Safety Precautions With  cues Assistance/Device. Outcome: Progressing

## 2021-06-28 NOTE — Progress Notes (Addendum)
Dr Read Drivers made aware of patient's CBG of 507. He informed Probation officer to order a stat glucose then give the patient 9 units of Novolin R.

## 2021-06-28 NOTE — Progress Notes (Signed)
Physical Therapy Session Note  Patient Details  Name: Russell Gomez MRN: [id number] Date of Birth: [birth year]  Today's Date: 06/28/2021 PT Individual Time: [id number] PT Individual Time Calculation (min): 42 min   Short Term Goals: Week 1:  PT Short Term Goal 1 (Week 1): pt will transfer bed<>chair with LRAD and supervision PT Short Term Goal 2 (Week 1): pt will ambulate 43f with LRAD and CGA PT Short Term Goal 3 (Week 1): pt will perform simulated car transfer with LRAD and CGA  Skilled Therapeutic Interventions/Progress Updates:     Pt received supine in bed and agrees to therapy. No complaint of pain. Supine to sit with bed features and cues for positioning. Pt performs sit to stand and stand [facility] and [facility] for body mechanics and sequencing. WC transport to gym for time management. Pt performs repeated sit to stand transfers in parallel bars for strengthening and functional transfer training. Pt performs 1x5 with minA and cues for hand position on WC for optimal length tension relationship and body mechanics, as well as cues for initiation. In standing PT cues for decreased WB through arms to increase balance challenge and strengthening focus on legs. Following seated rest break, pt performs 1x5 reps sit to stand, this time with arms across chest during stand>sit transition for eccentric loading of legs and increased challenge. MinA required for first several reps progressing to modA with fatigue. Following extended seated rest break, pt performs additional 1x5 with arms across chest again for stand>sit.   Pt performs activity in standing for leg strengthening, balance training, and cognitive overlay. Sit to stand with modA to high low table. Pt tasked with constructing pipe figure, requiring min cueing for correct configuration. Pt able to complete puzzle in standing without seated rest break, standing for total of ~3 minutes. Pt performs activity again with different  configuration, again standing for ~3 minutes with alternating upper extremity support and close supervision from PT. WC transport back to room. Pt left seated with alarm intact and all needs within reach.  Therapy Documentation Precautions:  Precautions Precautions: Fall Precaution Comments: ostomy. abdominal incision Restrictions Weight Bearing Restrictions: No    Therapy/Group: Individual Therapy  WBreck Coons PT, DPT 06/28/2021, 3:46 PM

## 2021-06-28 NOTE — Progress Notes (Addendum)
Occupational Therapy Session Note  Patient Details  Name: Russell Gomez MRN: [id number] Date of Birth: Oct 24, 1961 Today's Date: 06/28/2021 OT Individual Time: 8768-1157 OT Individual Time Calculation (min): 56 min   Today's Date: 06/28/2021 OT Individual Time: 1430-1500    and Today's Date: 06/28/2021 OT Missed Time:   30 min Missed Time Reason:   fatigue/refusal   Short Term Goals: Week 1:  OT Short Term Goal 1 (Week 1): Pt will STS with LRAD and MIN A in prep for LB dressing OT Short Term Goal 2 (Week 1): Pt will empty ostomy with set up OT Short Term Goal 3 (Week 1): Pt will transfer to TTB and LRAD with MIN A OT Short Term Goal 4 (Week 1): Pt will don pants iwht MIN A  Skilled Therapeutic Interventions/Progress Updates:     Pt received in w/c with no pain but reporting fatigue from earlier  ADL:  Total A to don compression socls. Edu re on during day, doffing at night as well as use of bag to don socks. Teds for edema management.   Pt stands after returning from tx gym with MOD A to power up from w/c but remains standing with no UE support from sink to brush teeth, wash face and shave.  Educated pt on emptying ostomy into toilet at home seated for easy clean/care. Pt verbalized understanding   Therapeutic exercise W/c propulsion to dayroom with BUE with S and with BLE for hamstring isolation to improve BUE/LE endurance  Pt completes 2x4 min NuStep for quad and glute isolation with Les only on level 3 for total steps 155 steps in first interval level 7/10 for difficulty. Second interval remains on level 3 resistance with cuing to push through heels instead of toes. Second interval 156 steps per 4 min. 4 min rest between sets required d/t deconditioning  Pt continues to insist he will leave Wednesday despite needing increased assist with sit to stand d/t fatigue and BLE weakness.  Pt left at end of session in w/c with exit alarm on, call light in reach and all needs  met  Session 2:  Pt received in w/c at doorway reporting no pain, fatigue and "my car battery died, and I need to help my wife find a new one" Pt only wants to completes BADL then assist wife, "also, im really wore out from you all." Yet pt continues to demo poor insight into fatigue/ BLE deficits impacting safety to go home MOD I! ADL:  Pt completes bathing with supervision seated, crossing Les into figure 4 to wash BLE Pt completes UB dressing with set up. Abdominal incision, colostomy, and IV covered in occlusives Pt completes LB dressing with MOD A STS at sink after set up to thread BLE Pt completes footwear with set up to don BLE Pt completes shower/Tub transfer with MAX A and heavy reliance on grab bars.  Pt declined further tx d/t fatigue and needing to "help my wife." Pt missed 30 min skilled OT. Will follow up as able.  Pt left at end of session in bed with exit alarm on, call light in reach and all needs met   Therapy Documentation Precautions:  Precautions Precautions: Fall Precaution Comments: ostomy. abdominal incision Restrictions Weight Bearing Restrictions: No General:   Vital Signs: Therapy Vitals Temp: 98.1 F (36.7 C) Temp Source: Oral Pulse Rate: 74 Resp: 18 BP: 125/67 Patient Position (if appropriate): Lying Oxygen Therapy SpO2: 98 % O2 Device: Room Air Pain:   ADL: ADL  Grooming: Supervision/safety Where Assessed-Grooming: Wheelchair Upper Body Bathing: Supervision/safety Where Assessed-Upper Body Bathing: Shower Lower Body Bathing: Maximal assistance Where Assessed-Lower Body Bathing: Shower Upper Body Dressing: Supervision/safety Where Assessed-Upper Body Dressing: Wheelchair Lower Body Dressing: Maximal assistance Where Assessed-Lower Body Dressing: Wheelchair Toileting: Maximal assistance Where Assessed-Toileting: Recruitment consultant Transfer: Moderate assistance Toilet Transfer Method: Stand pivot Toilet Transfer Equipment: Architectural technologist Method: Psychologist, educational: Moderate assistance Medical sales representative    Praxis   Exercises:   Other Treatments:     Therapy/Group: Individual Therapy  Russell Gomez 06/28/2021, 6:47 AM

## 2021-06-29 LAB — BASIC METABOLIC PANEL
Anion gap: 7 (ref 5–15)
BUN: 14 mg/dL (ref 6–20)
CO2: 29 mmol/L (ref 22–32)
Calcium: 8.2 mg/dL — ABNORMAL LOW (ref 8.9–10.3)
Chloride: 99 mmol/L (ref 98–111)
Creatinine, Ser: 0.79 mg/dL (ref 0.61–1.24)
GFR, Estimated: >60 mL/min (ref >60)
Glucose, Bld: 331 mg/dL — ABNORMAL HIGH (ref 70–99)
Potassium: 3.4 mmol/L — ABNORMAL LOW (ref 3.5–5.1)
Sodium: 135 mmol/L (ref 135–145)

## 2021-06-29 LAB — GLUCOSE, CAPILLARY
Glucose-Capillary: 227 mg/dL — ABNORMAL HIGH (ref 70–99)
Glucose-Capillary: 322 mg/dL — ABNORMAL HIGH (ref 70–99)
Glucose-Capillary: 329 mg/dL — ABNORMAL HIGH (ref 70–99)
Glucose-Capillary: 207 mg/dL — ABNORMAL HIGH (ref 70–99)
Glucose-Capillary: 364 mg/dL — ABNORMAL HIGH (ref 70–99)
Glucose-Capillary: 271 mg/dL — ABNORMAL HIGH (ref 70–99)

## 2021-06-29 MED ORDER — INSULIN NPH (HUMAN) (ISOPHANE) 100 UNIT/ML ~~LOC~~ SUSP
12.0000 [IU] | Freq: Two times a day (BID) | SUBCUTANEOUS | Status: DC
  Administered 2021-06-29 – 2021-06-30 (×2): 12 [IU] via SUBCUTANEOUS
  Filled 2021-06-29: qty 10

## 2021-06-29 MED ORDER — POTASSIUM CHLORIDE 20 MEQ PO PACK
40.0000 meq | PACK | Freq: Once | ORAL | Status: AC
  Administered 2021-06-29: 40 meq via ORAL
  Filled 2021-06-29: qty 2

## 2021-06-29 MED ORDER — DIAZEPAM 5 MG PO TABS
2.5000 mg | ORAL_TABLET | Freq: Every day | ORAL | Status: DC
  Administered 2021-06-30: 2.5 mg via ORAL
  Filled 2021-06-29: qty 1

## 2021-06-29 NOTE — Progress Notes (Signed)
Occupational Therapy Session Note  Patient Details  Name: Russell Gomez MRN: [id number] Date of Birth: 11/01/1961  Today's Date: 06/29/2021 OT Individual Time: 1459-1535 OT Individual Time Calculation (min): 36 min    Short Term Goals: Week 1:  OT Short Term Goal 1 (Week 1): Pt will STS with LRAD and MIN A in prep for LB dressing OT Short Term Goal 2 (Week 1): Pt will empty ostomy with set up OT Short Term Goal 3 (Week 1): Pt will transfer to TTB and LRAD with MIN A OT Short Term Goal 4 (Week 1): Pt will don pants iwht MIN A  Skilled Therapeutic Interventions/Progress Updates:  Pt greeted seated in w/c reporting fatigue but agreeable to OT intervention. Pt reports wanting to DC home tomorrow. Session focus on functional mobility in ADL apartment. Pt transported to apartment with total A for time mgmt. Pt completed bed mobility to flat bed with CGA however pt required increased time to elevate BLEs to bed, education provided on using gait belt as left lifter prn. Pt completed ambulatory transfer to recliner with RW and CGA. Attempted to demo to pt how to use 3n1 as shower seat ( pt reports he doesn't know what his shower set- up will be at a home yet, suggested 3n1 as DME d/t as it will work in both [employer] and tub shower), pt declined as pt reports he will likely standin the shower. Education provided on sitting to shower for energy conservation. Pt completed functional mobility in kitchen with Rw and CGA, education provided on ECS for kitchen use. Pt reports he likes to cook, encouraged pt to complete meal prep tasks within pts tolerance from sitting, pt agreeable. Pt transported back to room in w/c with total A. Pt left seated in w/c with chair alarm activated and all needs within reach.   Therapy Documentation Precautions:  Precautions Precautions: Fall Precaution Comments: ostomy. abdominal incision Restrictions Weight Bearing Restrictions: No  Pain: Pt reports no pain during session.     Therapy/Group: Individual Therapy  [name] 06/29/2021, 4:13 PM

## 2021-06-29 NOTE — Progress Notes (Signed)
PROGRESS NOTE   Subjective/Complaints: Russell Gomez is pleased with his progress with therapy and asks whether he may be discharged tomorrow- I have sent message to team His leg swelling improved a little, urinating frequently from torsemide Discussed his elevated CBGs and provided dietary education   ROS: +bilateral lower extremity swelling, negative chest pain shortness of breath nausea vomiting diarrhea constipation.+frequent urination  Objective:   No results found. No results for input(s): WBC, HGB, HCT, PLT in the last 72 hours. Recent Labs    06/28/21 1731 06/29/21 0621  NA  --  135  K  --  3.4*  CL  --  99  CO2  --  29  GLUCOSE 481* 331*  BUN  --  14  CREATININE  --  0.79  CALCIUM  --  8.2*    Intake/Output Summary (Last 24 hours) at 06/29/2021 1221 Last data filed at 06/29/2021 1207 Gross per 24 hour  Intake 1386 ml  Output 4200 ml  Net -2814 ml        Physical Exam: Vital Signs Blood pressure 133/64, pulse 90, temperature 98.6 F (37 C), temperature source Oral, resp. rate 17, height '6\' 2"'$  (1.88 m), weight 101.1 kg, SpO2 93 %.  General: No acute distress, BMI 28.62 Mood and affect are appropriate Heart: Regular rate and rhythm no rubs murmurs or extra sounds Lungs: Clear to auscultation, breathing unlabored, no rales or wheezes Abdomen: Positive bowel sounds, soft nontender to palpation, nondistended Extremities: No clubbing, cyanosis, or edema Skin: No evidence of breakdown, no evidence of rash Abdominal:     Comments: Colostomy intact  Neurological:     Comments: Patient is alert.  Makes eye contact with examiner.  He appears motivated.  Follows simple commands.  Provides name and age.  MSK: Lower extremities 4-/5 strength Psych: bright and motivated   Assessment/Plan: 1. Functional deficits which require 3+ hours per day of interdisciplinary therapy in a comprehensive inpatient rehab  setting. Physiatrist is providing close team supervision and 24 hour management of active medical problems listed below. Physiatrist and rehab team continue to assess barriers to discharge/monitor patient progress toward functional and medical goals  Care Tool:  Bathing    Body parts bathed by patient: Right arm, Left arm   Body parts bathed by helper: Buttocks     Bathing assist Assist Level: Moderate Assistance - Patient 50 - 74%     Upper Body Dressing/Undressing Upper body dressing   What is the patient wearing?: Pull over shirt    Upper body assist Assist Level: Supervision/Verbal cueing    Lower Body Dressing/Undressing Lower body dressing      What is the patient wearing?: Pants     Lower body assist Assist for lower body dressing: Maximal Assistance - Patient 25 - 49%     Toileting Toileting    Toileting assist Assist for toileting: Moderate Assistance - Patient 50 - 74%     Transfers Chair/bed transfer  Transfers assist     Chair/bed transfer assist level: Moderate Assistance - Patient 50 - 74%     Locomotion Ambulation   Ambulation assist   Ambulation activity did not occur: Safety/medical concerns  Assist  level: Contact Guard/Touching assist Assistive device: Walker-rolling Max distance: 90   Walk 10 feet activity   Assist  Walk 10 feet activity did not occur: Safety/medical concerns  Assist level: Contact Guard/Touching assist Assistive device: Walker-rolling   Walk 50 feet activity   Assist Walk 50 feet with 2 turns activity did not occur: Safety/medical concerns  Assist level: Contact Guard/Touching assist Assistive device: Walker-rolling    Walk 150 feet activity   Assist Walk 150 feet activity did not occur: Safety/medical concerns         Walk 10 feet on uneven surface  activity   Assist Walk 10 feet on uneven surfaces activity did not occur: Safety/medical concerns         Wheelchair     Assist Is  the patient using a wheelchair?: No Type of Wheelchair: Manual Wheelchair activity did not occur: Safety/medical concerns  Wheelchair assist level: Supervision/Verbal cueing Max wheelchair distance: 100    Wheelchair 50 feet with 2 turns activity    Assist    Wheelchair 50 feet with 2 turns activity did not occur: Safety/medical concerns   Assist Level: Supervision/Verbal cueing   Wheelchair 150 feet activity     Assist  Wheelchair 150 feet activity did not occur: Safety/medical concerns   Assist Level: Minimal Assistance - Patient > 75%   Blood pressure 133/64, pulse 90, temperature 98.6 F (37 C), temperature source Oral, resp. rate 17, height '6\' 2"'$  (1.88 m), weight 101.1 kg, SpO2 93 %.    Medical Problem List and Plan: 1.   Debility secondary to diverticulosis with perforation status post Jeanette Caprice procedure/colostomy 06/14/2021             -patient may shower             -ELOS/Goals: 5-7 days modI  Continue CIR 2.  Antithrombotics: -DVT/anticoagulation: Eliquis [employer]: Other (comment)             -antiplatelet therapy: N/A 3. Pain Management: Tylenol as needed 4. Anxiety: decrease Valium to 2.5 mg HS.  Provide Emotional support             -antipsychotic agents: N/A 5. Neuropsych: This patient is capable of making decisions on his own behalf. 6. Colostomy: Initiate colostomy education. Routine skin checks 7. Fluids/Electrolytes/Nutrition: Routine in and outs with follow-up chemistries. 8.  Acute blood loss anemia.Follow up CBC 9.  PAF/ Amiodarone 200 mg daily, Cardizem 180 mg daily and continue Eliquis 5 mg twice daily.   Vitals:   06/28/21 1957 06/29/21 0356  BP: (!) 119/53 133/64  Pulse: 91 90  Resp: 18 17  Temp: 98 F (36.7 C) 98.6 F (37 C)  SpO2: 95% 93%  On torsemide 10.  Alcohol withdrawal with delirium tremors.  Patient initially with CIWA protocol.  Provide counseling. Check magnesium level tomorrow.  11.  Diabetes mellitus.  Latest  hemoglobin A1c 8.2 Semglee 15 units nightly.  Diabetic teaching CBG (last 3)  Recent Labs    06/29/21 0515 06/29/21 0606 06/29/21 1206  GLUCAP 322* 329* 207*  Increase Novolin to 12U, provided dietary education.   12.  Aspiration pneumonia.  Complete course of Augmentin 13.Tobacco abuse .Nicoderm patch.Provide counseling 14. Bilateral lower extremity swelling: compression garments, elevate, ice, i change furosemide to torsemide given low albumin, brisk diuresis yesterday recheck metabolic package in a.m. 15. Vitamin D deficiency: supplement 50,000U ergocalciferol 50,000U once per week for 7 weeks 16. Magnesium deficiency: 2 grams IV magnesium supplemented 17.  Bilateral lower extremity edema secondary to  low albumin, was on Lasix however this is protein bound will use torsemide instead 18. Hypokalemia: 15mq K+ today.  LOS: 4 days A FACE TO FACE EVALUATION WAS PERFORMED  KClide DeutscherRaulkar 06/29/2021, 12:21 PM

## 2021-06-29 NOTE — Progress Notes (Signed)
This chaplain responded to the Pt. consult for creating or updating Advance Directive.  The Pt. reviewed his understanding of the role of an HCPOA with the chaplain.  The Pt. is ready to notarize the HCPOA on Tuesday.  The chaplain will check the Pt. therapy schedule before contacting the notary and setting up an appointment.  This chaplain is available for F/U spiritual care as needed.

## 2021-06-29 NOTE — Progress Notes (Signed)
Occupational Therapy Session Note  Patient Details  Name: Russell Gomez MRN: [id number] Date of Birth: 13-Apr-1961  Today's Date: 06/29/2021 OT Individual Time: 1133-1200 and [id number] OT Individual Time Calculation (min): 27 min and 55 min   Short Term Goals: Week 1:  OT Short Term Goal 1 (Week 1): Pt will STS with LRAD and MIN A in prep for LB dressing OT Short Term Goal 2 (Week 1): Pt will empty ostomy with set up OT Short Term Goal 3 (Week 1): Pt will transfer to TTB and LRAD with MIN A OT Short Term Goal 4 (Week 1): Pt will don pants iwht MIN A  Skilled Therapeutic Interventions/Progress Updates:    1) Treatment session with focus on sit > stand, dynamic standing balance, problem solving and awareness of errors, and activity tolerance.  Pt received upright in w/c with nurse tech assisting with toileting needs (emptying ostomy bag and urinal).  Pt completed sit > stand throughout session at min assist level.  Engaged in pattern replication with foam blocks while standing.  Therapist providing mod question cues for awareness of errors and to correct.  Discussed carryover of table top task to functional tasks with awareness of errors and problem solving.  Engaged in alternating toe taps in standing with CGA for standing balance and UE support on RW.  Pt fatiguing quickly and requiring seated rest breaks.  Pt left seated upright in w/c with chair alarm on and all needs in reach.  2) Treatment session with focus on sit> stand, dynamic standing balance, weight shifting, BUE strengthening, and procedural memory.  Pt received upright in w/c reporting fatigue but agreeable to therapy session.  Engaged in corn hole/bean bag toss in standing with CGA while engaging in forward stepping pattern when tossing bean bags.  Engaged in sit > stand with RW with min- CGA with improved forward weight shift throughout session.  Therapist directed pt in [place] with green theraband for BUE strengthening  and endurance as needed for overall strengthening and RW use.  Engaged in "simon" memory in standing, challenging pt to recall colored sequence and tap colored dots in sequence.  Therapist providing CGA for standing balance.  Pt able to recall pattern of up to 5 before making errors or requiring repetition.  Discussed importance of writing things down to increase recall.  Pt returned to room and remained upright in w/c with chair alarm on and all needs in reach.  Therapy Documentation Precautions:  Precautions Precautions: Fall Precaution Comments: ostomy. abdominal incision Restrictions Weight Bearing Restrictions: No  Pain:  Pt with no c/o pain     Therapy/Group: Individual Therapy  Simonne Come 06/29/2021, 12:08 PM

## 2021-06-29 NOTE — Progress Notes (Signed)
Pt was able to empty his own colostomy bag with set up.

## 2021-06-29 NOTE — Progress Notes (Signed)
Physical Therapy Session Note  Patient Details  Name: Russell Gomez MRN: [id number] Date of Birth: [birth year]  Today's Date: 06/29/2021 PT Individual Time: 0902-1002 PT Individual Time Calculation (min): 60 min   Short Term Goals: Week 1:  PT Short Term Goal 1 (Week 1): pt will transfer bed<>chair with LRAD and supervision PT Short Term Goal 2 (Week 1): pt will ambulate 35f with LRAD and CGA PT Short Term Goal 3 (Week 1): pt will perform simulated car transfer with LRAD and CGA  Skilled Therapeutic Interventions/Progress Updates:   Patient seated EOB dressed and shoes on.  Reports tired after four sessions yesterday, but learned how to stand better.  Patient stood from elevated surface of bed with CGA to RW.  Ambulated to w/c 10' with CGA.  In w/c propelled to therapy gym with S occasional cues for technique.  Patient sit to stand to RW and ambulated x 100' with CGA to S.  Patient seated EOM working on core and upper back strength with exercises as noted below.  Patient requesting to toilet, then incontinent of urine.  Stand step to w/c with RW CGA.  Assisted in w/c to room and ambulated with RW to toilet min A and stood to finish voiding in toilet with CGA for balance.  Patient ambulated to EOB and doffed shorts with min A.  Patient donned underwear and clean shorts with CGA for sit to stand.  Transferred to w/c and assisted back to rehab gym for time management.  [facility] and performed core strengthening as noted below with weighted ball.  Seated and standingLE therex as noted below.  Performed standing balance without UE support over 30 sec trials with feet apart, feet together, semi-tandem with R then L foot in back.  Patient ambulated x 140' to room with RW and CGA.  Left seated in w/c with seat alarm active and call bell/needs in reach.  Therapy Documentation Precautions:  Precautions Precautions: Fall Precaution Comments: ostomy. abdominal  incision Restrictions Weight Bearing Restrictions: No  Pain: Pain Assessment Pain Scale: 0-10 Pain Score: 0-No pain  Exercises: General Exercises - Upper Extremity Shoulder Flexion: Strengthening;Both;10 reps;Seated;Theraband (diagonal with ER) Theraband Level (Shoulder Flexion): Level 3 (Green) Shoulder Horizontal ABduction: Strengthening;10 reps;Seated;Theraband Theraband Level (Shoulder Horizontal Abduction): Level 3 (Green) Elbow Extension: Strengthening;Both;10 reps;Seated;Theraband Theraband Level (Elbow Extension): Level 3 (Green) General Exercises - Lower Extremity Long Arc Quad: Strengthening;Both;10 reps;Seated;Weights (4#) Hip ABduction/ADduction: Strengthening;Both;10 reps;Standing (UE support on walker and CGA) Hip Flexion/Marching: Strengthening;10 reps;Seated;Both Heel Raises: Strengthening;10 reps;Standing;Both (UE support on walker and CGA) Other Exercises Other Exercises: seated core strength with blue weighted ball moving up/down at arms length x 10 Other Exercises: Seated core strength with blue weighted ball moving hip to opposite shoulder each direction x 10 Other Exercises: seated core strength with blue weighted ball moving hip to hip x 10 Other Exercises: seated rows with green t-band x 10 cues for posture    Therapy/Group: Individual Therapy  CReginia NaasCMagda Kiel PT 06/29/2021, 9:51 AM

## 2021-06-29 NOTE — Consult Note (Signed)
[place] Nurse ostomy follow up Patient transferred from [facility]. Went by his room on Friday 06/26/21 to see if he had supplies in his room. Took wrong supplies from room and left the correct ones. WOC will FU with him this week for any further educational needs.  Cathlean Marseilles Tamala Julian, MSN, RN, [place], Lysle Pearl, [facility] Wound Treatment Associate Pager [phone]

## 2021-06-30 ENCOUNTER — Ambulatory Visit: Payer: BC Managed Care – PPO | Admitting: Cardiovascular Disease

## 2021-06-30 LAB — GLUCOSE, CAPILLARY
Glucose-Capillary: 106 mg/dL — ABNORMAL HIGH (ref 70–99)
Glucose-Capillary: 253 mg/dL — ABNORMAL HIGH (ref 70–99)
Glucose-Capillary: 279 mg/dL — ABNORMAL HIGH (ref 70–99)
Glucose-Capillary: 261 mg/dL — ABNORMAL HIGH (ref 70–99)

## 2021-06-30 MED ORDER — TORSEMIDE 20 MG PO TABS: 20.0000 mg | ORAL_TABLET | Freq: Every day | ORAL | 0 refills | Status: DC

## 2021-06-30 MED ORDER — FOLIC ACID 1 MG PO TABS: 1.0000 mg | ORAL_TABLET | Freq: Every day | ORAL | 0 refills | Status: DC

## 2021-06-30 MED ORDER — PANTOPRAZOLE SODIUM 40 MG PO TBEC: 40.0000 mg | DELAYED_RELEASE_TABLET | Freq: Every day | ORAL | 0 refills | Status: DC

## 2021-06-30 MED ORDER — INSULIN GLARGINE-YFGN 100 UNIT/ML ~~LOC~~ SOLN: 15.0000 [IU] | Freq: Every day | SUBCUTANEOUS | Status: DC

## 2021-06-30 MED ORDER — VITAMIN D (ERGOCALCIFEROL) 1.25 MG (50000 UNIT) PO CAPS: 50000.0000 [IU] | ORAL_CAPSULE | ORAL | 0 refills | Status: DC

## 2021-06-30 MED ORDER — AMIODARONE HCL 200 MG PO TABS: 200.0000 mg | ORAL_TABLET | Freq: Every day | ORAL | 0 refills | Status: DC

## 2021-06-30 MED ORDER — DILTIAZEM HCL ER COATED BEADS 180 MG PO CP24
180.0000 mg | ORAL_CAPSULE | Freq: Every day | ORAL | 0 refills | Status: DC
Start: 2021-06-30 — End: 2021-08-25

## 2021-06-30 MED ORDER — NICOTINE 21 MG/24HR TD PT24: MEDICATED_PATCH | TRANSDERMAL | 0 refills | Status: DC

## 2021-06-30 MED ORDER — PRAVASTATIN SODIUM 80 MG PO TABS: 80.0000 mg | ORAL_TABLET | Freq: Every day | ORAL | 0 refills | Status: DC

## 2021-06-30 MED ORDER — POTASSIUM CHLORIDE CRYS ER 20 MEQ PO TBCR: 20.0000 meq | EXTENDED_RELEASE_TABLET | Freq: Every day | ORAL | 0 refills | Status: DC

## 2021-06-30 MED ORDER — APIXABAN 5 MG PO TABS: 5.0000 mg | ORAL_TABLET | Freq: Two times a day (BID) | ORAL | 5 refills | Status: DC

## 2021-06-30 MED ORDER — INSULIN NPH (HUMAN) (ISOPHANE) 100 UNIT/ML ~~LOC~~ SUSP: 10.0000 [IU] | Freq: Two times a day (BID) | SUBCUTANEOUS | 11 refills | Status: DC

## 2021-06-30 MED ORDER — ACETAMINOPHEN 325 MG PO TABS: 650.0000 mg | ORAL_TABLET | Freq: Four times a day (QID) | ORAL | Status: DC | PRN

## 2021-06-30 MED ORDER — DIAZEPAM 5 MG PO TABS
2.5000 mg | ORAL_TABLET | Freq: Every day | ORAL | 0 refills | Status: DC
Start: 2021-06-30 — End: 2021-08-28

## 2021-06-30 MED ORDER — MAGNESIUM OXIDE -MG SUPPLEMENT 400 (240 MG) MG PO TABS: 400.0000 mg | ORAL_TABLET | Freq: Every day | ORAL | 0 refills | Status: DC

## 2021-06-30 NOTE — Progress Notes (Signed)
This chaplain is present with the Pt., notary, and two witnesses for the notarization of the Pt. Advance Directive:  HCPOA.  The Pt. named Russell Gomez as his healthcare agent. The chaplain gave the Pt. the original AD and one copy.  A copy of the Pt. AD was scanned into the Pt. EMR.  This chaplain is available for F/U spiritual care as needed.

## 2021-06-30 NOTE — Discharge Summary (Signed)
Physician Discharge Summary  Patient ID: Russell Gomez MRN: [id number] DOB/AGE: 06-14-1961 60 y.o.  Admit date: 06/25/2021 Discharge date: 07/01/2021  Discharge Diagnoses:  Principal Problem:   Debility DVT prophylaxis PAF Diverticulosis/colostomy Acute blood loss anemia Alcohol withdrawal Aspiration pneumonia Diabetes mellitus Tobacco use Hypokalemia  Discharged Condition: Stable  Significant Diagnostic Studies: CT ABDOMEN PELVIS W CONTRAST  Result Date: 06/14/2021 CLINICAL DATA:  Nausea and vomiting EXAM: CT ABDOMEN AND PELVIS WITH CONTRAST TECHNIQUE: Multidetector CT imaging of the abdomen and pelvis was performed using the standard protocol following bolus administration of intravenous contrast. CONTRAST:  128m OMNIPAQUE IOHEXOL 350 MG/ML SOLN COMPARISON:  None. FINDINGS: LOWER CHEST: Normal. HEPATOBILIARY: Diffuse hypoattenuation of the liver relative to the spleen suggests hepatic steatosis. No focal liver lesion or biliary dilatation. The gallbladder is normal. PANCREAS: Normal pancreas. No ductal dilatation or peripancreatic fluid collection. SPLEEN: Normal. ADRENALS/URINARY TRACT: The adrenal glands are normal. No hydronephrosis, nephroureterolithiasis or solid renal mass. The urinary bladder is normal for degree of distention STOMACH/BOWEL: There is a punctate focus free air in the ventral abdomen (series 2, image 30). There is no hiatal hernia. Normal duodenal course and caliber. Small amount of free fluid interleaved between loops of small bowel. There is hyperenhancement of the wall of the terminal ileum. Rectosigmoid diverticulosis without acute inflammation. There is intramural fat deposition within the proximal ascending colon and the descending colon. Normal appendix. VASCULAR/LYMPHATIC: There is calcific atherosclerosis of the abdominal aorta. No lymphadenopathy. REPRODUCTIVE: Normal prostate size with symmetric seminal vesicles. MUSCULOSKELETAL. No bony spinal canal  stenosis or focal osseous abnormality. OTHER: None. IMPRESSION: 1. Hyperenhancement of the wall of the terminal ileum with small amount of free fluid and punctate focus of free air in the ventral abdomen, suggesting acute Crohn's disease. 2. Multifocal punctate free air within the peritoneal cavity indicating perforation at an unknown location. 3. Submucosal fat deposition within the proximal ascending colon and descending colon, consistent with chronic inflammatory bowel disease. Critical Value/emergent results were called by telephone at the time of interpretation on 06/14/2021 at 3:24 am to [facility], who verbally acknowledged these results. Aortic Atherosclerosis (ICD10-I70.0). Electronically Signed   By: KUlyses JarredM.D.   On: 06/14/2021 03:24   DG Chest Port 1 View  Result Date: 06/16/2021 CLINICAL DATA:  60year old male with aspiration of gastric contents. EXAM: PORTABLE CHEST 1 VIEW COMPARISON:  Portable chest 12/26/2020 and earlier. FINDINGS: Portable AP upright view at 0435 hours. Lower lung volumes. Mediastinal contours remain normal. Visualized tracheal air column is within normal limits. Mild crowding of lung markings diffusely now. Otherwise Allowing for portable technique the lungs are clear. No confluent opacity. Visible bowel-gas pattern is within normal limits. No acute osseous abnormality identified. IMPRESSION: Low lung volumes.  No acute cardiopulmonary abnormality. Electronically Signed   By: HGenevie AnnM.D.   On: 06/16/2021 05:12   DG Abd 2 Views  Result Date: 06/16/2021 CLINICAL DATA:  Postoperative nausea and vomiting, abdominal pain, colon resection and appendectomy on 06/14/2021 EXAM: ABDOMEN - 2 VIEW COMPARISON:  CT abdomen and pelvis 06/14/2021 FINDINGS: Surgical drain in pelvis. Dilated a air-filled small bowel and to lesser degree colonic loops in the abdomen and pelvis consistent with postoperative ileus. No bowel wall thickening seen. Scattered atherosclerotic  calcifications aorta. Bones demineralized with degenerative disc disease changes lumbar spine. IMPRESSION: Postoperative ileus. Electronically Signed   By: MLavonia DanaM.D.   On: 06/16/2021 11:19   [place] SITE RITE  Result Date: 06/16/2021 If Site  Rite image not attached, placement could not be confirmed due to current cardiac rhythm.  [place] EKG SITE RITE  Result Date: 06/16/2021 If Site Rite image not attached, placement could not be confirmed due to current cardiac rhythm.   Labs:  Basic Metabolic Panel: Recent Labs  Lab 06/25/21 0410 06/26/21 0702 06/28/21 1731 06/29/21 0621  NA 134* 135  --  135  K 4.0 4.0  --  3.4*  CL 102 102  --  99  CO2 27 28  --  29  GLUCOSE 368* 237* 481* 331*  BUN 9 <5*  --  14  CREATININE 0.64 0.65  --  0.79  CALCIUM 7.2* 7.8*  --  8.2*  MG  --  1.4*  --   --     CBC: Recent Labs  Lab 06/26/21 0702  WBC 12.5*  NEUTROABS 8.8*  HGB 9.0*  HCT 27.1*  MCV 100.7*  PLT 538*    CBG: Recent Labs  Lab 06/29/21 2118 06/30/21 0625 06/30/21 1150 06/30/21 1624 06/30/21 2052  GLUCAP 271* 106* 253* 279* 261*   Family history.  Mother with hypertension hyperlipidemia.  Maternal uncle with diabetes and CAD.  Maternal aunt with CVA.  Denies any colon cancer esophageal cancer or rectal cancer  Brief HPI:   Russell Gomez is a 60 y.o. right-handed male with history of alcohol tobacco use atrial fibrillation maintained on Eliquis followed by Dr. Clayborn Bigness, diabetes mellitus hyperlipidemia.  Per chart review lives alone separated from his wife.  Admission chemistries unremarkable except glucose 443 lactic acid 4.5.  Urine drug screen positive marijuana, COVID PCR negative C. difficile negative.  Presented 06/14/2021 with nausea vomiting nonbloody diarrhea and blood sugars elevated x3 to 4 days.  CT abdomen pelvis showed hyperenhancement of the wall of the terminal ileum with small amount of free fluid and punctate focus of free air in the ventral abdomen as well as  multifocal punctate free air within the peritoneal cavity indicating possible perforation.  General surgery consulted underwent sigmoid colon resection colostomy 06/14/2021 per Dr. Ferrel Logan.  Hospital course complicated by acute blood loss anemia 10.5.  Intermittent bouts of orthostasis maintained on ProAmatine.  Atrial fibrillation Cardizem as well as amiodarone and Eliquis was resumed.  Bouts of agitation restlessness suspect alcohol withdrawal maintained on CIWA protocol.  Aspiration pneumonia completing course of Augmentin.  Therapy evaluations completed due to patient decreased functional ability was admitted for a comprehensive rehab program.   Hospital Course: Russell Gomez was admitted to rehab 06/25/2021 for inpatient therapies to consist of PT, ST and OT at least three hours five days a week. Past admission physiatrist, therapy team and rehab RN have worked together to provide customized collaborative inpatient rehab.  Pertaining to patient's debility related to diverticulosis with perforation status post Hartmann procedure colostomy 06/14/2021.  Patient would follow-up general surgery.  Eliquis ongoing for history of atrial fibrillation cardiac rate controlled.  He will continue also with amiodarone was a Cardizem follow cardiology services.  History of diabetes mellitus question medical compliance hemoglobin A1c 8.2 insulin therapy as directed with diabetic teaching.  History of alcohol with withdrawal delirium tremors completed CIWA protocol.  Tobacco abuse NicoDerm patch provide counseling.  Completed course of Augmentin for aspiration pneumonia.  Mild hypokalemia 3.4 will supplement added.  Diet has been advanced.   Blood pressures were monitored on TID basis and soft and monitored  Diabetes has been monitored with ac/hs CBG checks and SSI was use prn for tighter BS control.  Rehab course: During patient's stay in rehab weekly team conferences were held to monitor patient's progress, set goals  and discuss barriers to discharge. At admission, patient required moderate assist sit to stand mod assist stand pivot transfers minimal assist 75 feet rolling walker  Physical exam.  Blood pressure 135/81 pulse 99 temperature 98 respiration 16 oxygen saturation 90% room air Constitutional.  No acute distress HEENT Head.  Normocephalic and atraumatic Eyes.  Pupils round and reactive to light no discharge.nystagmus Neck.  Supple nontender no JVD without thyromegaly Cardiac irregular irregular Abdomen soft nontender positive bowel sounds colostomy in place Respiratory effort normal no respiratory distress without wheeze Neurologic.  Alert oriented motivated follows commands.  Strength 4/5 throughout  He/She  has had improvement in activity tolerance, balance, postural control as well as ability to compensate for deficits. He/She has had improvement in functional use RUE/LUE  and RLE/LLE as well as improvement in awareness.  Working with energy conservation.  Ambulates contact-guard assist rolling walker.  Sit to stand with supervision.  Stand step to wheelchair rolling walker contact-guard.  Rolling walker to toilet minimal assist stood to finish voiding and toilet with contact-guard assist.  ADLs sessions focused on functional mobility.  Completed bed mobility to flat bed with contact-guard.  He is able to dress lower extremities.  Patient encouraged overall progress plan was discharged home.       Disposition: Discharged home   Diet: Carb modified  Special Instructions: No driving smoking or alcohol  Routine colostomy care  Medications at discharge 1.  Tylenol as needed 2.  Amiodarone 200 mg p.o. daily 3.  Eliquis 5 mg p.o. twice daily 4.  Valium 2.5 mg nightly 5.  Cardizem CD 180 mg daily 6.  Folic acid milligram daily 7.  Semglee 15 units nightly 8.  Multivitamin daily 9.  NicoDerm patch taper as directed 10.  Protonix 40 mg daily 11.  Demadex 20 mg daily 12.  Vitamin D  50,000 units every 7 days 13.  K dur.  20 mg daily 14.  SEMGLEE 15 unit daily   30-35 minutes were spent completing discharge summary and discharge planning  Discharge Instructions     Ambulatory referral to Physical Medicine Rehab   Complete by: As directed    Moderate complexity follow-up 1 to 2 weeks debility multi medical        Follow-up Information     Raulkar, Clide Deutscher, MD Follow up.   Specialty: Physical Medicine and Rehabilitation Why: Office to call for appointment Contact information: [id number] [place] [address] [phone]         Herbert Pun, MD Follow up.   Specialty: General Surgery Why: Call for appointment Contact information: [place] [address] [phone]         Ria Bush, MD Follow up.   Specialty: Family Medicine Contact information: [address] [place] [address] [phone]                 Signed: Cathlyn Parsons 07/01/2021, 5:39 AM

## 2021-06-30 NOTE — Progress Notes (Signed)
Occupational Therapy Session Note  Patient Details  Name: Russell Gomez MRN: [id number] Date of Birth: September 10, 1961  Today's Date: 06/30/2021 OT Individual Time: 1003-1058 OT Individual Time Calculation (min): 55 min    Short Term Goals: Week 1:  OT Short Term Goal 1 (Week 1): Pt will STS with LRAD and MIN A in prep for LB dressing OT Short Term Goal 2 (Week 1): Pt will empty ostomy with set up OT Short Term Goal 3 (Week 1): Pt will transfer to TTB and LRAD with MIN A OT Short Term Goal 4 (Week 1): Pt will don pants iwht MIN A  Skilled Therapeutic Interventions/Progress Updates:    Treatment session with focus on self-care retraining, functional mobility in home environment, and bathroom transfers.  Pt received upright in w/c reporting plan to d/c home tomorrow after wife comes in for training to complete colostomy care.  Pt ultimately agreeable to completing self-care tasks at sink after therapist reiterating need to see pt complete tasks as he had previously required mod-max assist and pt stating that he does not require any assistance.  Pt able to complete bathing and dressing at sit > stand level at sink, even donning TEDS and shoes this session in figure 4 position.  Pt completed tub/shower transfer in ADL tubroom as he reports that he will most likely being using tub/shower.  Pt required CGA when stepping over tub ledge as pt reports that he will not use a tub transfer bench.  Therapist has ordered at 3 in1 and educated on use in shower as option as needed for energy conservation.  Pt again adamant that he will not sit to shower, but reports that he will make sure that his wife is present for supervision when showering.  Pt completed toilet transfers with RW supervision and toileted Mod I this session.  Engaged in functional mobility in ADL kitchen with close supervision when ambulating with RW.  Educated on use of sliding items along counter top and use of walker bag to transport items.   Reiterated recommendation to only complete simple meal prep tasks and to leave the "heavy lifting" to his wife.  Pt returned to room and remained upright in w/c with all needs in reach, chair alarm on.  Therapy Documentation Precautions:  Precautions Precautions: Fall Precaution Comments: ostomy. abdominal incision Restrictions Weight Bearing Restrictions: No  Pain:  Pt with no c/o pain  Therapy/Group: Individual Therapy  Simonne Come 06/30/2021, 1:52 PM

## 2021-06-30 NOTE — Progress Notes (Signed)
Physical Therapy Discharge Summary  Patient Details  Name: Russell Gomez MRN: [id number] Date of Birth: 22-Jan-1961  Patient has met 3 of 10 long term goals due to improved activity tolerance, improved balance, improved postural control, increased strength, and improved coordination. Patient to discharge at an ambulatory level CGA/Supervision. Recommended family education prior to D/C, however pt's wife only able to attend family education for ostomy care.   Reasons goals not met: Pt did not meet goals of Mod I as pt currently requires CGA overall for functional mobility due to generalized weakness and deconditioning and decreased balance/postural control. Pt insistent on discharging on 8/24 despite recommendations and will not have 24/7 supervision at home.   Recommendation:  Patient will benefit from ongoing skilled PT services in home health setting to continue to advance safe functional mobility, address ongoing impairments in transfers, generalized strengthening, dynamic standing balance/coordination, gait training, endurance, and to minimize fall risk.  Equipment: RW  Reasons for discharge: discharge from hospital  Patient/family agrees with progress made and goals achieved: Yes  PT Discharge Precautions/Restrictions Precautions Precautions: Fall Precaution Comments: ostomy. abdominal incision Restrictions Weight Bearing Restrictions: No Pain Interference Pain Interference Pain Effect on Sleep: 1. Rarely or not at all Pain Interference with Therapy Activities: 0. Does not apply - I have not received rehabilitationtherapy in the past 5 days Pain Interference with Day-to-Day Activities: 1. Rarely or not at all Cognition Overall Cognitive Status: Within Functional Limits for tasks assessed Arousal/Alertness: Awake/alert Orientation Level: Oriented X4 Memory: Appears intact Awareness: Impaired Problem Solving: Appears intact Safety/Judgment: Impaired Comments: poor insight  into deficits Sensation Sensation Light Touch: Appears Intact Proprioception: Appears Intact Coordination Gross Motor Movements are Fluid and Coordinated: No Fine Motor Movements are Fluid and Coordinated: No Coordination and Movement Description: mild uncoordination due to decreased balance/postural control and generalized weakness and deconditioning -improved since eval Finger Nose Finger Test: tremors bilaterally Heel Shin Test: decreased ROM bilaterally Motor  Motor Motor: Abnormal postural alignment and control Motor - Skilled Clinical Observations: mild uncoordination due to decreased balance/postural control and generalized weakness and deconditioning  Mobility Bed Mobility Bed Mobility: Rolling Right;Rolling Left;Sit to Supine;Supine to Sit Rolling Right: Independent with assistive device Rolling Left: Independent with assistive device Supine to Sit: Independent with assistive device Sit to Supine: Independent with assistive device Transfers Transfers: Sit to Stand;Stand to Sit;Stand Pivot Transfers Sit to Stand: Contact Guard/Touching assist Stand to Sit: Supervision/Verbal cueing Stand Pivot Transfers: Contact Guard/Touching assist Stand Pivot Transfer Details: Verbal cues for technique Stand Pivot Transfer Details (indicate cue type and reason): verbal cues for ecentric descent Transfer (Assistive device): Rolling walker Locomotion  Gait Ambulation: Yes Gait Assistance: Contact Guard/Touching assist Gait Distance (Feet): 150 Feet Assistive device: Rolling walker Gait Gait: Yes Gait Pattern: Impaired Gait Pattern: Step-through pattern;Decreased step length - right;Decreased step length - left;Decreased stride length;Trunk flexed;Poor foot clearance - right;Poor foot clearance - left;Narrow base of support;Decreased trunk rotation Gait velocity: decreased Stairs / Additional Locomotion Stairs: Yes Stairs Assistance: Contact Guard/Touching assist Stair Management  Technique: Two rails Number of Stairs: 12 Height of Stairs: 6 Ramp: Contact Guard/touching assist (RW) Product manager Mobility: Yes Wheelchair Assistance: Chartered loss adjuster: Both upper extremities Wheelchair Parts Management: Needs assistance Distance: 17f  Trunk/Postural Assessment  Cervical Assessment Cervical Assessment: Exceptions to [facility](forward head) Thoracic Assessment Thoracic Assessment: Exceptions to [facility](ky[facility]) Lumbar Assessment Lumbar Assessment: Exceptions to [facility](posterior pelvic tilt in sitting) Postural Control Postural Control: Deficits on evaluation  Balance Balance Balance  Assessed: Yes Static Sitting Balance Static Sitting - Balance Support: Feet supported;Bilateral upper extremity supported Static Sitting - Level of Assistance: 7: Independent Dynamic Sitting Balance Dynamic Sitting - Balance Support: Feet supported;No upper extremity supported Dynamic Sitting - Level of Assistance: 7: Independent Static Standing Balance Static Standing - Balance Support: Bilateral upper extremity supported (RW) Static Standing - Level of Assistance: 5: Stand by assistance (supervision) Dynamic Standing Balance Dynamic Standing - Balance Support: Bilateral upper extremity supported (RW) Dynamic Standing - Level of Assistance: 5: Stand by assistance (CGA) Extremity Assessment  RLE Assessment RLE Assessment: Exceptions to [facility] RLE Strength Right Hip Flexion: 3/5 Right Hip ABduction: 4-/5 Right Hip ADduction: 4-/5 Right Knee Flexion: 4-/5 Right Knee Extension: 4-/5 Right Ankle Dorsiflexion: 4-/5 Right Ankle Plantar Flexion: 4-/5 LLE Assessment LLE Assessment: Exceptions to [facility] LLE Strength Left Hip Flexion: 3/5 Left Hip ABduction: 4-/5 Left Hip ADduction: 4-/5 Left Knee Flexion: 4-/5 Left Knee Extension: 4-/5 Left Ankle Dorsiflexion: 4-/5 Left Ankle Plantar Flexion: 4-/5  Alfonse Alpers PT, DPT   06/30/2021, 12:23 PM

## 2021-06-30 NOTE — Progress Notes (Signed)
This chaplain updated the Pt., possible notary present for notarizing AD today between 2:15-3:00 pm today. This chaplain will confirm with the Pt. as plans are finalized.

## 2021-06-30 NOTE — Patient Care Conference (Signed)
Inpatient RehabilitationTeam Conference and Plan of Care Update Date: 06/30/2021   Time:  13:37 PM   Patient Name: Russell Gomez      Medical Record Number: [id number]  Date of Birth: 05-May-1961 Sex: Male         Room/Bed: 4W13C/4W13C-01 Payor Info: Payor: [employer] / Plan: [employer]BS COMM PPO / Product Type: *No Product type* /    Admit Date/Time:  06/25/2021  1:15 PM  Primary Diagnosis:  [facility] Problems: Principal Problem:   Debility    Expected Discharge Date: Expected Discharge Date: 07/01/21  Team Members Present: Physician leading conference: Dr. Leeroy Cha Social Worker Present: Ovidio Kin, LCSW Nurse Present: Dorien Chihuahua, RN PT Present: Becky Sax, PT OT Present: Simonne Come, OT PPS Coordinator present : Ileana Ladd, PT     Current Status/Progress Goal Weekly Team Focus  Bowel/Bladder   cont of Bladder, colostomy  remain cont. of bladder. colostomy care by pt.  assess q shift and PRN   Swallow/Nutrition/ Hydration             ADL's   Supervision bathing at seated level, Mod A LB dressing, Supervision UB dressing and donning socks.  CGA - Min A sit > stand and ambulatory transfers with RW  Supervision shower, bathing, meal prep; Mod I dressing, toileting, and toilet transfers  ADL retraining, dynamic standing balance, d/c planning   Mobility   min to CGA sit to stand to RW, gait up to 150' with RW and CGA, steps x 8 with rails and min/CGA, no car transfer yet  mod I/S     Communication             Safety/Cognition/ Behavioral Observations            Pain   denies pain  pain<3  assess pain q shift and PRN   Skin   abdomenal surgical incision, staples on.  proper healing of surgical incision.        Discharge Planning:  Home to his home with ex-wife assisting but alone at times will need to be mod/i and educated on colostomy care   Team Discussion: Patient anxious to go home. Bil LE edema addressed with medication. Elevated  CBGs addressed and reviewed dietary modifications along with vitamin B deficiency.  Patient on target to meet rehab goals: Currently CGA overall, able to manage 12 steps with 2 rails. Completed bathing a the sink and completes sit to stand pivot with mod ui assist. Mod I assist for dressing and able to complete ostomy care. Requires CGA for showers and transfers.  *See Care Plan and progress notes for long and short-term goals.   Revisions to Treatment Plan:  Will not meet PT/OT goals for mod I due to discharge date move  Teaching Needs: Safety, medications, skin care, ostomy care, etc  Current Barriers to Discharge: Decreased caregiver support and Home enviroment access/layout  Possible Resolutions to Barriers: Education with family DME: BSC and walker recommended Ostomy supplies provided and 1-800 # for consumer assistance     Medical Summary Current Status: bilateral lower extremity swelling, frequent urination, elevated CBGs, vitamin D deficiency  Barriers to Discharge: Medical stability;Wound care  Barriers to Discharge Comments: bilateral lower extremity swelling, frequent urination, elevated CBGs, vitamin D deficiency Possible Resolutions to [employer] Focus: continue lower extremity compression garments, continue daily wound care, continue ergocalciferol 50,000U once per week for 7 weeks, remove staples today, increase insulin   Continued Need for Acute Rehabilitation Level  of Care: The patient requires daily medical management by a physician with specialized training in physical medicine and rehabilitation for the following reasons: Direction of a multidisciplinary physical rehabilitation program to maximize functional independence : Yes Medical management of patient stability for increased activity during participation in an intensive rehabilitation regime.: Yes Analysis of laboratory values and/or radiology reports with any subsequent need for medication adjustment and/or  medical intervention. : Yes   I attest that I was present, lead the team conference, and concur with the assessment and plan of the team.   Dorien Chihuahua B 06/30/2021, 2:04 PM

## 2021-06-30 NOTE — Consult Note (Addendum)
[facility] Nurse ostomy follow up Pt had colostomy surgery performed on 8/7 at [facility] and had multiple ostomy educational sessions and pouch change demonstrations. He states he is hoping to discharge later today.  Offered to demonstrate pouch change procedure to Pt one more time and he declined.  He states, "I know how to do it, and I have been emptying it without any help." Current pouch is intact with good seal and mod amt thick tan stool.  5 sets of wafers/pouches/barrier rings left in room for use after discharge.  Reviewed ordering supplies from [facility] after he returns home.  He states he has the box of free samples at his house and will cal the phone number to arrange monthly supplies as instructed.  Denies further questions at this time. Enrolled patient in [place] Discharge program: Yes, previously.  POST NOTE 0930: Called back to patient room.  He states he will discharge tomorrow and his wife would like to be present for one more pouch change and teaching session "about 08:00 or 08:15, after she drops our daughter off at school."  [facility] team will perform tomorrow as requested.   Thank-you,  Julien Girt MSN, [place], [place], [place], [place]

## 2021-06-30 NOTE — Progress Notes (Signed)
Inpatient Rehabilitation Care Coordinator Discharge Note   Patient Details  Name: Russell Gomez MRN: [id number] Date of Birth: [birth year]   Discharge location: Home with ex-wife who is in and out  Length of Stay:  6 days  Discharge activity level: cga-supervision level  Home/community participation: yes  Patient response to:   Patient response [id number] Isolation - How often do you feel lonely or isolated from those around you?: Sometimes  Services provided included: MD, RD, PT, OT, RN, CM, Pharmacy, SW  Financial Services:  Financial Services Utilized: Private Insurance bcbs-terming 07/08/2021  Choices offered to/list presented to: pt  Follow-up services arranged:  DME, Patient/Family has no preference for HH/DME agencies      DME : [employer]  rolling walker and 3 in 1    Patient response to transportation need: Is the patient able to respond to transportation needs?: Yes In the past 12 months, has lack of transportation kept you from medical appointments or from getting medications?: No In the past 12 months, has lack of transportation kept you from meetings, work, or from getting things needed for daily living?: No    Comments (or additional information):pt insisted on going home and not needing to stay here to reach goals of mod/I level. Feels it will be trial and error at home. Not interested in substance abuse resources-will go with ex-wife to AA  Patient/Family verbalized understanding of follow-up arrangements:  Yes  Individual responsible for coordination of the follow-up plan: self [phone]  Confirmed correct DME delivered: Elease Hashimoto 06/30/2021    Carmela Piechowski, [place]

## 2021-06-30 NOTE — Progress Notes (Signed)
Physical Therapy Session Note  Patient Details  Name: Russell Gomez MRN: [id number] Date of Birth: [birth year]  Today's Date: 06/30/2021 PT Individual Time: 0800-0908 and [id number]  PT Individual Time Calculation (min): 68 min and 24 min  Short Term Goals: Week 1:  PT Short Term Goal 1 (Week 1): pt will transfer bed<>chair with LRAD and supervision PT Short Term Goal 2 (Week 1): pt will ambulate 66f with LRAD and CGA PT Short Term Goal 3 (Week 1): pt will perform simulated car transfer with LRAD and CGA  Skilled Therapeutic Interventions/Progress Updates:   Treatment Session 1 Received pt sitting EOB with ostomy care RN present at bedside providing education. Pt agreeable to PT treatment and denied any pain during session but reported not sleeping well last night due to urinary frequency. Pt also insistent on wanting to leave today. Discussed pt's CLOF and goals and that care team will meet at 1:30 this afternoon to discuss progress; pt verbalized understanding. Session with emphasis on functional mobility/transfers, generalized strengthening, dynamic standing balance/coordination, gait training, simulated car transfers, stair navigation, and improved activity tolerance. Stand<>pivot bed<>WC with RW and CGA and RN present to administer medications and check vitals. [facility] and performed simulated car transfer with RW and CGA to enter car but min A to stand from low car seat surface. Pt then ambulated 145fon uneven surfaces (ramp) with RW and CGA. Pt navigated 12 steps with 2 rails and CGA ascending and descending with a step to pattern with min cues for "up with the good, down with the bad" technique to avoid excessive strain on R knee. [facility] and transferred on/off Nustep with RW and CGA. Pt performed BUE/LE strengthening on Nustep at workload 3 for 8 minutes for a total of 422 steps for improved  cardiovascular endurance. MD present for morning rounds to discuss pt's desire to D/C today. Also discussed recommendation for HHPT and recommendation for family education prior to D/C; pt ultimately agreeable and CSW notified. Pt then ambulated 18074f 2 trials with RW and CGA/close supervision. Pt demonstrates flexed trunk with downward gaze, decreased bilateral foot clearance, and decreased cadence but no LOB noted. [facility] Concluded session with pt sitting in WC, needs within reach, and chair pad alarm on. Provided pt with fresh ice water.   Treatment Session 2  Received pt sitting in WC, pt agreeable to PT treatment, and reported R knee pain but declined any pain interventions. Session with emphasis on functional mobility/transfers, generalized strengthening, dynamic standing balance/coordination, ambulation, and improved activity tolerance. Pt performed WC mobility [id number] BUE and supervision and able to stand and pick up lego from floor using RW and CGA/min A. Pt then ambulated 220ft40fh RW and CGA/close supervision. [facility] and transferred stand<>pivot WC<>bed with RW and CGA and doffed ted hose with total A. Concluded session with pt sitting EOB with NT present assessing vitals.   Therapy Documentation Precautions:  Precautions Precautions: Fall Precaution Comments: ostomy. abdominal incision Restrictions Weight Bearing Restrictions: No  Therapy/Group: Individual Therapy Sarabella Caprio Alfonse AlpersDPT   06/30/2021, 7:13 AM

## 2021-06-30 NOTE — Progress Notes (Signed)
Occupational Therapy Discharge Summary  Patient Details  Name: Russell Gomez MRN: [id number] Date of Birth: Apr 18, 1961   Patient has met 8 of 12 long term goals due to improved activity tolerance, improved balance, postural control, and ability to compensate for deficits.  Patient to discharge at overall Supervision/CGA with ambulation, Mod I with bathing/dressing at sit > stand level.  Patient's care partner unavailable to provide the necessary supervision assistance at discharge, as wife is out of the home for hours at a time.  Pt adamant about returning home on 07/01/2021 with only family education being completed on colostomy care.  Pt currently requires CGA - close supervision with ambulatory transfers and therapist recommending 24/7 supervision due to decreased safety awareness, problem solving, and delayed insight into deficits.  Reasons goals not met: Pt continues to require CGA - Supervision with transfers and functional mobility and increased cues for awareness.  Recommendation:  Patient will benefit from ongoing skilled OT services in home health setting to continue to advance functional skills in the area of BADL and Reduce care partner burden.  Equipment: 3 in1 for shower  Reasons for discharge: discharge from hospital  Patient/family agrees with progress made and goals achieved: Yes  OT Discharge Precautions/Restrictions  Precautions Precautions: Fall Precaution Comments: ostomy. abdominal incision Restrictions Weight Bearing Restrictions: No Pain Pain Assessment Pain Scale: 0-10 Pain Score: 0-No pain ADL ADL Eating: Independent Where Assessed-Eating: Edge of bed Grooming: Modified independent Where Assessed-Grooming: Standing at sink Upper Body Bathing: Supervision/safety Where Assessed-Upper Body Bathing: Standing at sink Lower Body Bathing: Supervision/safety Where Assessed-Lower Body Bathing: Sitting at sink, Standing at sink Upper Body Dressing: Modified  independent (Device) Where Assessed-Upper Body Dressing: Sitting at sink Lower Body Dressing: Modified independent Where Assessed-Lower Body Dressing: Sitting at sink, Standing at sink Toileting: Modified independent Where Assessed-Toileting: Glass blower/designer: Close supervision Toilet Transfer Method: Counselling psychologist: Grab bars Tub/Shower Transfer: Metallurgist Method: Optometrist: Grab bars Vision Baseline Vision/History: 0 No visual deficits Patient Visual Report: No change from baseline Vision Assessment?: No apparent visual deficits Perception  Perception: Within Functional Limits Praxis Praxis: Intact Cognition Overall Cognitive Status: Within Functional Limits for tasks assessed Arousal/Alertness: Awake/alert Orientation Level: Oriented X4 Year: 2022 Month: August Day of Week: Correct Memory: Appears intact Immediate Memory Recall: Sock;Blue;Bed Memory Recall Sock: With Cue Memory Recall Blue: Without Cue Memory Recall Bed: Without Cue Awareness: Impaired Problem Solving: Impaired Safety/Judgment: Impaired Comments: poor insight into deficits Sensation Sensation Light Touch: Appears Intact Proprioception: Appears Intact Coordination Gross Motor Movements are Fluid and Coordinated: No Fine Motor Movements are Fluid and Coordinated: No Coordination and Movement Description: mild uncoordination due to decreased balance/postural control and generalized weakness and deconditioning -improved since eval Finger Nose Finger Test: tremors bilaterally Heel Shin Test: decreased ROM bilaterally Motor  Motor Motor: Abnormal postural alignment and control Motor - Skilled Clinical Observations: mild uncoordination due to decreased balance/postural control and generalized weakness and deconditioning Mobility  Bed Mobility Bed Mobility: Rolling Right;Rolling Left;Sit to Supine;Supine to Sit Rolling Right:  Independent with assistive device Rolling Left: Independent with assistive device Supine to Sit: Independent with assistive device Sit to Supine: Independent with assistive device Transfers Sit to Stand: Contact Guard/Touching assist Stand to Sit: Supervision/Verbal cueing  Trunk/Postural Assessment  Cervical Assessment Cervical Assessment: Exceptions to [facility] (forward head) Thoracic Assessment Thoracic Assessment: Exceptions to [facility] (kyphosis) Lumbar Assessment Lumbar Assessment: Exceptions to [facility] (posterior pelvic tilt in sitting) Postural Control Postural Control: Deficits on evaluation  Balance Balance Balance Assessed: Yes Static Sitting Balance Static Sitting - Balance Support: Feet supported;Bilateral upper extremity supported Static Sitting - Level of Assistance: 7: Independent Dynamic Sitting Balance Dynamic Sitting - Balance Support: Feet supported;No upper extremity supported Dynamic Sitting - Level of Assistance: 7: Independent Static Standing Balance Static Standing - Balance Support: Bilateral upper extremity supported (RW) Static Standing - Level of Assistance: 5: Stand by assistance (supervision) Dynamic Standing Balance Dynamic Standing - Balance Support: Bilateral upper extremity supported (RW) Dynamic Standing - Level of Assistance: 5: Stand by assistance (CGA) Extremity/Trunk Assessment RUE Assessment RUE Assessment: Within Functional Limits General Strength Comments: generalized weakness LUE Assessment LUE Assessment: Within Functional Limits General Strength Comments: generalized weakness   Russell Gomez, [facility] 06/30/2021, 2:39 PM

## 2021-06-30 NOTE — Progress Notes (Deleted)
Cardiology Office Note  Date:  06/30/2021   ID:  Russell Gomez, DOB February 20, 1961, MRN [id number]  PCP:  Ria Bush, MD   No chief complaint on file.   HPI:  [name] is a 60 year old gentleman with past medical history of hypertension  hyperlipidemia  Diabetes alcohol abuse  Hospitalization December 27, 2020 for atrial fibrillation Presents to establish care in the [place] office, follow-up of his atrial fibrillation  EKG in the hospital 2/190/2022 c/o nausea and vomiting that began 3 days ago That day with 10 episodes of nonbloody nonbilious emesis. Was told there was blood at home when throwing up Potassium 3.1 Noted to be in atrial fibrillation with RVR Treated with amiodarone infusion, diltiazem infusion Treated for alcohol withdrawal in the hospital Converted to normal sinus rhythm in the hospital  Discharged on metoprolol, Cardizem CD with amiodarone Also on Eliquis  In follow-up today reports he is not on Cardizem, does not have amiodarone or Eliquis He is taking metoprolol tartrate 50 twice daily He does have a coupon for Eliquis, plans on getting this filled.  Unclear why he does not have his other medications  Reports that he feels relatively well, denies any tachypalpitations Etiology of the bilious vomiting that led to his hospital admission is unclear Possibly from alcohol  Lab work reviewed Hemoglobin A1c 7.4 On insulin at home  EKG personally reviewed by myself on todays visit Shows normal sinus rhythm with rate 99 bpm PACs   PMH:   has a past medical history of Arthritis, Dental crowns present, Diabetes type 1, uncontrolled ([place]), Esophageal reflux, Goiter (04/2012), High cholesterol, and HTN (hypertension).  PSH:    Past Surgical History:  Procedure Laterality Date   APPENDECTOMY  06/14/2021   Procedure: APPENDECTOMY;  Surgeon: Herbert Pun, MD;  Location: [facility];  Service: General;;   COLON RESECTION SIGMOID  06/14/2021    Procedure: COLON RESECTION SIGMOID WITH END COLOSTOMY CREATION;  Surgeon: Herbert Pun, MD;  Location: [facility];  Service: General;;   DISTAL INTERPHALANGEAL JOINT FUSION Right 03/05/2014   Procedure: DEBRIDEMENT (DIP) DISTAL INTERPHALANGEAL RIGHT MIDDLE FINGER;  Surgeon: Wynonia Sours, MD;  Location: [place];  Service: Orthopedics;  Laterality: Right;   KNEE ARTHROSCOPY     LAPAROTOMY N/A 06/14/2021   Procedure: EXPLORATORY LAPAROTOMY;  Surgeon: Herbert Pun, MD;  Location: [facility];  Service: General;  Laterality: N/A;   MASS EXCISION Right 03/05/2014   Procedure: EXCISION CYST ;  Surgeon: Wynonia Sours, MD;  Location: [place];  Service: Orthopedics;  Laterality: Right;  ANESTHESIA: IV REGINAL FAB   OPEN REDUCTION NASAL FRACTURE  12/26/2008   with closure of nasal lac.   ORIF DISTAL RADIUS FRACTURE Right 12/26/2008   ORIF WRIST FRACTURE Left 05/12/2016   takedown of nonunion/malunion and OPEN REDUCTION INTERNAL FIXATION (ORIF) WRIST FRACTURE;  Surgeon: Corky Mull, MD   PERCUTANEOUS PINNING Left 02/06/2013   Procedure: PINNING PIP OF THE LEFT MIDDLE FINGER ;  Surgeon: Wynonia Sours, MD;  Location: [place];  Service: Orthopedics;  Laterality: Left;   SKIN GRAFT SPLIT THICKNESS LEG / FOOT Right 1985   thigh after trauma vs machine at work   TRIGGER FINGER RELEASE  11/28/2012   Procedure: RELEASE TRIGGER FINGER/A-1 PULLEY;  Surgeon: Wynonia Sours, MD;  Laterality: Left;  EXCISION MASS LEFT RING FINGER, RELEASE A-1 PULLEY LEFT RING FINGER (ganglion cyst)    No current facility-administered medications for this visit.   No  current outpatient medications on file.   Facility-Administered Medications Ordered in Other Visits  Medication Dose Route Frequency Provider Last Rate Last Admin   acetaminophen (TYLENOL) tablet 650 mg  650 mg Oral Q6H PRN Angiulli, Lavon Paganini, PA-C       Or   acetaminophen (TYLENOL) suppository 650 mg  650 mg  Rectal Q6H PRN Angiulli, Lavon Paganini, PA-C       amiodarone (PACERONE) tablet 200 mg  200 mg Oral Daily Cathlyn Parsons, PA-C   200 mg at 06/29/21 [id number]   apixaban (ELIQUIS) tablet 5 mg  5 mg Oral BID Cathlyn Parsons, PA-C   5 mg at 06/29/21 2044   camphor-menthol (SARNA) lotion   Topical PRN Charlett Blake, MD   Given at 06/27/21 1244   diazepam (VALIUM) tablet 2.5 mg  2.5 mg Oral QHS Raulkar, Clide Deutscher, MD       diltiazem (CARDIZEM CD) 24 hr capsule 180 mg  180 mg Oral Daily Cathlyn Parsons, PA-C   180 mg at [id number]   folic acid (FOLVITE) tablet 1 mg  1 mg Oral Daily Cathlyn Parsons, PA-C   1 mg at 06/29/21 [id number]   insulin NPH Human (NOVOLIN N) injection 12 Units  12 Units Subcutaneous BID AC & HS Raulkar, Clide Deutscher, MD   12 Units at 06/30/21 0645   insulin regular (NOVOLIN R) 100 units/mL injection 0-5 Units  0-5 Units Subcutaneous QHS Hammons, Kimberly B, RPH   3 Units at 06/29/21 2137   insulin regular (NOVOLIN R) 100 units/mL injection 0-9 Units  0-9 Units Subcutaneous TID WC Hammons, Theone Murdoch, RPH   9 Units at 06/29/21 1723   multivitamin with minerals tablet 1 tablet  1 tablet Oral Daily Cathlyn Parsons, PA-C   1 tablet at 06/29/21 [id number]   nicotine (NICODERM CQ - dosed in mg/24 hours) patch 21 mg  21 mg Transdermal Daily Cathlyn Parsons, PA-C   21 mg at 06/29/21 0740   nutrition supplement (JUVEN) (JUVEN) powder packet 1 packet  1 packet Oral BID BM Raulkar, Clide Deutscher, MD   1 packet at 06/29/21 1401   pantoprazole (PROTONIX) EC tablet 40 mg  40 mg Oral QHS Cathlyn Parsons, PA-C   40 mg at 06/29/21 2044   polyethylene glycol (MIRALAX / GLYCOLAX) packet 17 g  17 g Oral Daily PRN Kirsteins, Luanna Salk, MD       polyvinyl alcohol (LIQUIFILM TEARS) 1.4 % ophthalmic solution 1-2 drop  1-2 drop Left Eye QID PRN Raulkar, Clide Deutscher, MD       simethicone (MYLICON) chewable tablet 80 mg  80 mg Oral QID PRN Charlett Blake, MD   80 mg at 06/30/21 [id number]   torsemide (DEMADEX)  tablet 20 mg  20 mg Oral Daily Charlett Blake, MD   20 mg at 06/29/21 [id number]   Vitamin D (Ergocalciferol) (DRISDOL) capsule 50,000 Units  50,000 Units Oral Q7 days Izora Ribas, MD   50,000 Units at 06/26/21 1708     Allergies:   Insulin aspart, Spironolactone, Hydrochlorothiazide, and Morphine   Social History:  The patient  reports that he has been smoking cigarettes. He has a 20.00 pack-year smoking history. He has never used smokeless tobacco. He reports current alcohol use of about 7.0 standard drinks per week. He reports that he does not use drugs.   Family History:   family history includes Coronary artery disease in his maternal uncle; Diabetes in his maternal uncle,  paternal grandmother, and paternal uncle; Healthy in his father; Hyperlipidemia in his mother; Hypertension in his mother; Stroke in his maternal aunt.    Review of Systems: Review of Systems  Constitutional: Negative.   HENT: Negative.    Respiratory: Negative.    Cardiovascular: Negative.   Gastrointestinal: Negative.   Musculoskeletal: Negative.   Neurological: Negative.   Psychiatric/Behavioral: Negative.    All other systems reviewed and are negative.  PHYSICAL EXAM: VS:  There were no vitals taken for this visit. , BMI There is no height or weight on file to calculate BMI. GEN: Well nourished, well developed, in no acute distress HEENT: normal Neck: no JVD, carotid bruits, or masses Cardiac: RRR; no murmurs, rubs, or gallops,no edema  Respiratory:  clear to auscultation bilaterally, normal work of breathing GI: soft, nontender, nondistended, + BS MS: no deformity or atrophy Skin: warm and dry, no rash Neuro:  Strength and sensation are intact Psych: euthymic mood, full affect    Recent Labs: 01/14/2021: TSH 0.42 06/26/2021: ALT 14; Hemoglobin 9.0; Magnesium 1.4; Platelets 538 06/29/2021: BUN 14; Creatinine, Ser 0.79; Potassium 3.4; Sodium 135    Lipid Panel Lab Results  Component Value  Date   CHOL 207 (H) 04/16/2020   HDL 134.80 04/16/2020   LDLCALC 36 04/16/2020   TRIG 181.0 (H) 04/16/2020      Wt Readings from Last 3 Encounters:  06/30/21 221 lb 5.5 oz (100.4 kg)  06/25/21 241 lb 11.2 oz (109.6 kg)  05/27/21 179 lb 14.3 oz (81.6 kg)       ASSESSMENT AND PLAN:  Problem List Items Addressed This Visit       Cardiology Problems   Essential hypertension   Type 1 diabetes mellitus with hyperlipidemia (HCC)   Hyperlipidemia due to type 1 diabetes mellitus (HCC)   Atrial fibrillation with RVR (HCC) - Primary     Other   Alcohol use disorder, moderate, dependence ([place])   TOBACCO ABUSE   Atrial fibrillation with RVR In the setting of bilious vomiting, alcohol abuse Alcohol cessation recommended, Currently not on his amiodarone, diltiazem, Eliquis Discussed risk and benefit of anticoagulation, CHADS VASC 2 (diabetes, hypertension) He will fill the Eliquis, start 5 twice daily, Significant ecchymotic bruising noted on exam today, We will see if he can tolerate Eliquis Recommended that he watch for hematemesis, GI bleeds, excessive bruising -For rate and rhythm control recommended we increase metoprolol to heart rate up to 100 twice daily Given polypharmacy, currently not on several of his medications that were prescribed at discharge, will try to keep his medication list smaller  Diabetes Managed by primary care Hemoglobin A1c 7.4  Smoker We have encouraged him to continue to work on weaning his cigarettes and smoking cessation. He will continue to work on this and does not want any assistance with chantix.   Essential hypertension Increase metoprolol as above, Was previously on enalapril, this was held during recent hospitalization We will continue to hold Was discharged on diltiazem extended release, did not start this, will continue to hold    Total encounter time more than 60 minutes  Greater than 50% was spent in counseling and coordination of  care with the patient    Signed, Esmond Plants, M.D., Ph.D. [place], [place]

## 2021-06-30 NOTE — Progress Notes (Signed)
Patient ID: Russell Gomez, male   DOB: 12/21/1960, 60 y.o.   MRN: [id number] Met with pt to discuss discharge needs and education prior to discharge tomorrow. He will have ex-wife come in at 8:00 am for education with colostomy and then feels will be ready for discharge. He does not want any education with therapies for her and feels ready to go home. He feels it will be trial and error once home and has no worries about going home. He feels we worry more than he does. Ordered his equipment-rolling walker and 3 in 1 in room already for home. He will not receive any home health due to insurance and it is terming on 8/31. Pt main focus is to go home. Asked if needed to call ex-wife he felt no need.

## 2021-06-30 NOTE — Progress Notes (Signed)
PROGRESS NOTE   Subjective/Complaints: Mr. Cappello would like to leave tomorrow. Wife will come in for caregiver training tomorrow morning Swelling has improved with compression garments and torsemide   ROS: +bilateral lower extremity swelling, negative chest pain shortness of breath nausea vomiting diarrhea constipation.+frequent urination- disrupts his sleep.  Objective:   No results found. No results for input(s): WBC, HGB, HCT, PLT in the last 72 hours. Recent Labs    06/28/21 1731 06/29/21 0621  NA  --  135  K  --  3.4*  CL  --  99  CO2  --  29  GLUCOSE 481* 331*  BUN  --  14  CREATININE  --  0.79  CALCIUM  --  8.2*    Intake/Output Summary (Last 24 hours) at 06/30/2021 1814 Last data filed at 06/30/2021 1500 Gross per 24 hour  Intake 476 ml  Output 4825 ml  Net -4349 ml        Physical Exam: Vital Signs Blood pressure 131/74, pulse 88, temperature 98.4 F (36.9 C), temperature source Oral, resp. rate 19, height '6\' 2"'$  (1.88 m), weight 100.4 kg, SpO2 100 %.  General: No acute distress, BMI 28.62 Mood and affect are appropriate Heart: Regular rate and rhythm no rubs murmurs or extra sounds Lungs: Clear to auscultation, breathing unlabored, no rales or wheezes Abdomen: Positive bowel sounds, soft nontender to palpation, nondistended Extremities: Bilateral lower extremity swelling is improving Skin: No evidence of breakdown, no evidence of rash Abdominal:     Comments: Colostomy intact  Neurological:     Comments: Patient is alert.  Makes eye contact with examiner.  He appears motivated.  Follows simple commands.  Provides name and age.  MSK: Lower extremities 4-/5 strength Psych: bright and motivated   Assessment/Plan: 1. Functional deficits which require 3+ hours per day of interdisciplinary therapy in a comprehensive inpatient rehab setting. Physiatrist is providing close team supervision and 24 hour  management of active medical problems listed below. Physiatrist and rehab team continue to assess barriers to discharge/monitor patient progress toward functional and medical goals  Care Tool:  Bathing    Body parts bathed by patient: Right arm, Left arm, Chest, Abdomen, Front perineal area, Buttocks, Right upper leg, Left upper leg, Right lower leg, Left lower leg, Face   Body parts bathed by helper: Buttocks     Bathing assist Assist Level: Supervision/Verbal cueing     Upper Body Dressing/Undressing Upper body dressing   What is the patient wearing?: Pull over shirt    Upper body assist Assist Level: Independent with assistive device    Lower Body Dressing/Undressing Lower body dressing      What is the patient wearing?: Pants, Underwear/pull up     Lower body assist Assist for lower body dressing: Independent with assitive device     Toileting Toileting    Toileting assist Assist for toileting: Independent with assistive device     Transfers Chair/bed transfer  Transfers assist     Chair/bed transfer assist level: Supervision/Verbal cueing     Locomotion Ambulation   Ambulation assist   Ambulation activity did not occur: Safety/medical concerns  Assist level: Contact Guard/Touching assist Assistive device:  Walker-rolling Max distance: 127f   Walk 10 feet activity   Assist  Walk 10 feet activity did not occur: Safety/medical concerns  Assist level: Contact Guard/Touching assist Assistive device: Walker-rolling   Walk 50 feet activity   Assist Walk 50 feet with 2 turns activity did not occur: Safety/medical concerns  Assist level: Contact Guard/Touching assist Assistive device: Walker-rolling    Walk 150 feet activity   Assist Walk 150 feet activity did not occur: Safety/medical concerns  Assist level: Contact Guard/Touching assist Assistive device: Walker-rolling    Walk 10 feet on uneven surface  activity   Assist Walk 10  feet on uneven surfaces activity did not occur: Safety/medical concerns   Assist level: Contact Guard/Touching assist Assistive device: Walker-rolling   Wheelchair     Assist Is the patient using a wheelchair?: No Type of Wheelchair: Manual Wheelchair activity did not occur: Safety/medical concerns  Wheelchair assist level: Supervision/Verbal cueing Max wheelchair distance: 1547f   Wheelchair 50 feet with 2 turns activity    Assist    Wheelchair 50 feet with 2 turns activity did not occur: Safety/medical concerns   Assist Level: Supervision/Verbal cueing   Wheelchair 150 feet activity     Assist  Wheelchair 150 feet activity did not occur: Safety/medical concerns   Assist Level: Supervision/Verbal cueing   Blood pressure 131/74, pulse 88, temperature 98.4 F (36.9 C), temperature source Oral, resp. rate 19, height '6\' 2"'$  (1.88 m), weight 100.4 kg, SpO2 100 %.    Medical Problem List and Plan: 1.   Debility secondary to diverticulosis with perforation status post HaJeanette Capricerocedure/colostomy 06/14/2021             -patient may shower             -ELOS/Goals: [place]   2.  Antithrombotics: -DVT/anticoagulation: Eliquis Pharmaceutical: Other (comment)             -antiplatelet therapy: N/A 3. Pain Management: Tylenol as needed 4. Anxiety: continue Valium to 2.5 mg HS.  Provide Emotional support             -antipsychotic agents: N/A 5. Neuropsych: This patient is capable of making decisions on his own behalf. 6. Colostomy: Initiate colostomy education. Routine skin checks 7. Fluids/Electrolytes/Nutrition: Routine in and outs with follow-up chemistries. 8.  Acute blood loss anemia.Follow up CBC 9.  PAF/ Continue Amiodarone 200 mg daily, Cardizem 180 mg daily and continue Eliquis 5 mg twice daily.   Vitals:   06/30/21 0810 06/30/21 1526  BP: 121/86 131/74  Pulse: 95 88  Resp:  19  Temp:  98.4 F  (36.9 C)  SpO2:  100%  On torsemide 10.  Alcohol withdrawal with delirium tremors.  Patient initially with CIWA protocol.  Provide counseling. Check magnesium level tomorrow.  11.  Diabetes mellitus.  Latest hemoglobin A1c 8.2 Semglee 15 units nightly.  Diabetic teaching CBG (last 3)  Recent Labs    06/30/21 0625 06/30/21 1150 06/30/21 1624  GLUCAP 106* 253* 279*  Continue Novolin to 12U, provided dietary education.   12.  Aspiration pneumonia.  Complete course of Augmentin 13.Tobacco abuse .Nicoderm patch.Provide counseling 14. Bilateral lower extremity swelling: compression garments, elevate, ice, i change furosemide to torsemide given low albumin, brisk diuresis yesterday recheck metabolic package in a.m. 15. Vitamin D deficiency: supplement 50,000U ergocalciferol 50,000U once per week for 7 weeks 16. Magnesium deficiency: 2 grams IV magnesium supplemented 17.  Bilateral lower extremity edema  secondary to low albumin, was on Lasix however this is protein bound will use torsemide instead 18. Hypokalemia: 8mq K+ today.  LOS: 5 days A FACE TO FACE EVALUATION WAS PERFORMED  KClide DeutscherRaulkar 06/30/2021, 6:14 PM

## 2021-06-30 NOTE — Progress Notes (Signed)
Occupational Therapy Session Note  Patient Details  Name: Russell Gomez MRN: [id number] Date of Birth: May 07, 1961  Today's Date: 06/30/2021 OT Individual Time: 1331-1415 OT Individual Time Calculation (min): 44 min    Short Term Goals: Week 1:  OT Short Term Goal 1 (Week 1): Pt will STS with LRAD and MIN A in prep for LB dressing OT Short Term Goal 2 (Week 1): Pt will empty ostomy with set up OT Short Term Goal 3 (Week 1): Pt will transfer to TTB and LRAD with MIN A OT Short Term Goal 4 (Week 1): Pt will don pants iwht MIN A  Skilled Therapeutic Interventions/Progress Updates:  Pt greeted seated in w/c agreeable to OT intervention. Session focus on implementing BUE HEP with level 3 theraband into daily routine. Pt completed x10 reps of bicep curls, shoulder flexion, chest pulls, tricep extension, and punches. Pt also completed x10 reps of LAQ and hip ADD/ABD with level 3 theraband from sitting in w/c. Pt completed x3 sit<>stands with Rw and supervision assist. Pt completed functional mobility greater than a household distance with RW and CGA. Pt left seated in w/c with chair alarm activated and all needs within reach.   Therapy Documentation Precautions:  Precautions Precautions: Fall Precaution Comments: ostomy. abdominal incision Restrictions Weight Bearing Restrictions: No  Pain: Pt reports mild pain in R knee, offered rest breaks and repositioning as pain mgmt strategies.    Therapy/Group: Individual Therapy  [name] 06/30/2021, 3:44 PM

## 2021-07-01 LAB — GLUCOSE, CAPILLARY
Glucose-Capillary: 392 mg/dL — ABNORMAL HIGH (ref 70–99)
Glucose-Capillary: 405 mg/dL — ABNORMAL HIGH (ref 70–99)

## 2021-07-01 MED ORDER — SEMGLEE 100 UNIT/ML ~~LOC~~ SOPN: 15.0000 [IU] | PEN_INJECTOR | Freq: Every day | SUBCUTANEOUS | 11 refills | Status: DC

## 2021-07-01 MED ORDER — INSULIN GLARGINE-YFGN 100 UNIT/ML ~~LOC~~ SOLN
15.0000 [IU] | Freq: Every day | SUBCUTANEOUS | Status: DC
  Administered 2021-07-01: 15 [IU] via SUBCUTANEOUS
  Filled 2021-07-01: qty 0.15

## 2021-07-01 NOTE — Final Consult Note (Signed)
[place] Nurse ostomy follow up Patient receiving care in [facility] [phone]. Spouse present for ostomy care review. Spouse observed how I cut the opening on a skin barrier.  She opened/emptied/cleaned/closed the existing ostomy pouch. We discussed to empty when 1/3 to 1/2 full, and to change it twice weekly and if it begins to leak. We reviewed the contents of the box for home ostomy supplies.  She reports the [employer] had arrived, but she had not opened it.  I instructed them to open it TODAY when they arrive home, and to follow the instructions on continuing to receive ostomy products.  All of their questions were answered to their expressed satisfaction.  Val Riles, RN, MSN, CWOCN, CNS-BC, pager [phone]

## 2021-07-01 NOTE — Progress Notes (Signed)
Patient dc'd home.  D/C instructions provided via Linna Hoff, [place]. ABD staples removed per MD order, pt tolerated well.  Pt wheeled down via [place].

## 2021-07-01 NOTE — Progress Notes (Signed)
PROGRESS NOTE   Subjective/Complaints: Discussed elevated CBGs- he has insulin at home and endocrinology follow-up.  Discussed swelling in legs which has improved, but torsemide keeps him up at night urinating.    ROS: +bilateral lower extremity swelling, negative chest pain shortness of breath nausea vomiting diarrhea constipation.+frequent urination- continues to disrupt his sleep.  Objective:   No results found. No results for input(s): WBC, HGB, HCT, PLT in the last 72 hours. Recent Labs    06/28/21 1731 06/29/21 0621  NA  --  135  K  --  3.4*  CL  --  99  CO2  --  29  GLUCOSE 481* 331*  BUN  --  14  CREATININE  --  0.79  CALCIUM  --  8.2*    Intake/Output Summary (Last 24 hours) at 07/01/2021 0839 Last data filed at 07/01/2021 0540 Gross per 24 hour  Intake --  Output 2725 ml  Net -2725 ml        Physical Exam: Vital Signs Blood pressure (!) 144/77, pulse 82, temperature 98.1 F (36.7 C), temperature source Oral, resp. rate 18, height '6\' 2"'$  (1.88 m), weight 100.4 kg, SpO2 97 %.  Gen: no distress, normal appearing HEENT: oral mucosa pink and moist, NCAT Cardio: Reg rate Chest: normal effort, normal rate of breathing Abdominal:     Comments: Colostomy intact  Neurological:     Comments: Patient is alert.  Makes eye contact with examiner.  He appears motivated.  Follows simple commands.  Provides name and age.  MSK: Lower extremities 4-/5 strength Psych: bright and motivated   Assessment/Plan: 1. Functional deficits which require 3+ hours per day of interdisciplinary therapy in a comprehensive inpatient rehab setting. Physiatrist is providing close team supervision and 24 hour management of active medical problems listed below. Physiatrist and rehab team continue to assess barriers to discharge/monitor patient progress toward functional and medical goals  Care Tool:  Bathing    Body parts bathed  by patient: Right arm, Left arm, Chest, Abdomen, Front perineal area, Buttocks, Right upper leg, Left upper leg, Right lower leg, Left lower leg, Face   Body parts bathed by helper: Buttocks     Bathing assist Assist Level: Moderate Assistance - Patient 50 - 74%     Upper Body Dressing/Undressing Upper body dressing   What is the patient wearing?: Pull over shirt    Upper body assist Assist Level: Independent with assistive device    Lower Body Dressing/Undressing Lower body dressing      What is the patient wearing?: Pants, Underwear/pull up     Lower body assist Assist for lower body dressing: Independent with assitive device     Toileting Toileting    Toileting assist Assist for toileting: Independent with assistive device     Transfers Chair/bed transfer  Transfers assist     Chair/bed transfer assist level: Supervision/Verbal cueing     Locomotion Ambulation   Ambulation assist   Ambulation activity did not occur: Safety/medical concerns  Assist level: Contact Guard/Touching assist Assistive device: Walker-rolling Max distance: 12f   Walk 10 feet activity   Assist  Walk 10 feet activity did not occur: Safety/medical concerns  Assist level: Contact Guard/Touching assist Assistive device: Walker-rolling   Walk 50 feet activity   Assist Walk 50 feet with 2 turns activity did not occur: Safety/medical concerns  Assist level: Contact Guard/Touching assist Assistive device: Walker-rolling    Walk 150 feet activity   Assist Walk 150 feet activity did not occur: Safety/medical concerns  Assist level: Contact Guard/Touching assist Assistive device: Walker-rolling    Walk 10 feet on uneven surface  activity   Assist Walk 10 feet on uneven surfaces activity did not occur: Safety/medical concerns   Assist level: Contact Guard/Touching assist Assistive device: Walker-rolling   Wheelchair     Assist Is the patient using a  wheelchair?: No Type of Wheelchair: Manual Wheelchair activity did not occur: Safety/medical concerns  Wheelchair assist level: Supervision/Verbal cueing Max wheelchair distance: 145f    Wheelchair 50 feet with 2 turns activity    Assist    Wheelchair 50 feet with 2 turns activity did not occur: Safety/medical concerns   Assist Level: Supervision/Verbal cueing   Wheelchair 150 feet activity     Assist  Wheelchair 150 feet activity did not occur: Safety/medical concerns   Assist Level: Supervision/Verbal cueing   Blood pressure (!) 144/77, pulse 82, temperature 98.1 F (36.7 C), temperature source Oral, resp. rate 18, height '6\' 2"'$  (1.88 m), weight 100.4 kg, SpO2 97 %.    Medical Problem List and Plan: 1.   Debility secondary to diverticulosis with perforation status post HJeanette Capriceprocedure/colostomy 06/14/2021             -patient may shower             -ELOS/Goals: 5-7 days modI  D/c home today 2.  Antithrombotics: -DVT/anticoagulation: Eliquis [employer]: Other (comment)             -antiplatelet therapy: N/A 3. Pain Management: Tylenol as needed 4. Anxiety: continue Valium to 2.5 mg HS.  Provide Emotional support             -antipsychotic agents: N/A 5. Neuropsych: This patient is capable of making decisions on his own behalf. 6. Colostomy: Initiate colostomy education. Routine skin checks 7. Fluids/Electrolytes/Nutrition: Routine in and outs with follow-up chemistries. 8.  Acute blood loss anemia.Follow up CBC 9.  PAF/ Continue Amiodarone 200 mg daily, Cardizem 180 mg daily and continue Eliquis 5 mg twice daily.   Vitals:   06/30/21 2000 07/01/21 0501  BP: 126/65 (!) 144/77  Pulse: 90 82  Resp: 17 18  Temp: 98 F (36.7 C) 98.1 F (36.7 C)  SpO2: 94% 97%  On torsemide 10.  Alcohol withdrawal with delirium tremors.  Patient initially with CIWA protocol.  Provide counseling. Check magnesium level tomorrow.  11.  Diabetes mellitus.  Latest hemoglobin  A1c 8.2 Continue Semglee 15 units nightly.  Diabetic teaching CBG (last 3)  Recent Labs    06/30/21 2052 07/01/21 0646 07/01/21 0650  GLUCAP 261* 405* 392*  Continue Novolin to 12U, provided dietary education.   12.  Aspiration pneumonia.  Complete course of Augmentin 13.Tobacco abuse .Nicoderm patch.Provide counseling 14. Bilateral lower extremity swelling: compression garments, elevate, ice, i change furosemide to torsemide given low albumin, brisk diuresis yesterday recheck metabolic package in a.m. 15. Vitamin D deficiency: continue 50,000U ergocalciferol 50,000U once per week for 7 weeks 16. Magnesium deficiency: 2 grams IV magnesium supplemented 17.  Bilateral lower extremity edema secondary to low albumin, was on Lasix however this is protein bound will use torsemide instead, continue 18. Hypokalemia: 466m K+ today.    >  30 minutes spent in discharge of patient including review of medications and follow-up appointments, physical examination, and in answering all patient's questions   LOS: 6 days A FACE TO [place] 07/01/2021, 8:39 AM

## 2021-07-07 ENCOUNTER — Other Ambulatory Visit: Payer: Self-pay | Admitting: *Deleted

## 2021-07-07 NOTE — Telephone Encounter (Signed)
Missed last 4 hospital f/u visits.  He received #30 valium on 8/23. It was written as 1/2 tablet at night time which should have lasted him 2 months. How is he currently taking this?

## 2021-07-07 NOTE — Telephone Encounter (Signed)
Patient called stating that he has been in the hospital. Patient stated that he has been home for 4 days. Patient stated while he was in the hospital they gave him a low dose of Diazepam to take the edge off. Patient stated that he has been dealing with alcohol issues. Patient stated that he would like for Dr. Danise Mina to give him a small amount to use. Patient stated that he was told he would need to contact his PCP to get a refill on the diazepam. Last office visit .01/14/21 Pharmacy [facility]

## 2021-07-08 NOTE — Telephone Encounter (Signed)
Spoke with pt asking how he's taking med.  Says he's taking 1 tab in AM and 1 tab in PM, just as they were giving him in the hospital.  I relayed Dr. Synthia Innocent message.  Pt states he didn't read the instructions.  Also, states each time he tried to come for f/u he ended up back in the hospital.

## 2021-07-09 NOTE — Telephone Encounter (Signed)
If he's taking 1 tab BID, that should last him 15 days (through 9/6).  I recommend he drop to 1 tab QHS until his appt with PM&R and then review with them.  Recommend he go ahead and schedule hosp f/u visit with me, let us know if running out of diazepam prior.

## 2021-07-10 NOTE — Telephone Encounter (Signed)
Attempted to contact pt.  No answer.  No vm.  Need to relay Dr. Synthia Innocent message and schedule hosp f/u.

## 2021-07-14 NOTE — Telephone Encounter (Signed)
Attempted to contact pt.  No answer.  No vm.  Need to relay Dr. Synthia Innocent message and schedule hosp f/u.

## 2021-07-15 ENCOUNTER — Encounter: Payer: BC Managed Care – PPO | Admitting: Physical Medicine and Rehabilitation

## 2021-07-15 NOTE — Telephone Encounter (Signed)
Attempted to contact pt.  No answer.  No vm.  Need to relay Dr. Synthia Innocent message and schedule hosp f/u.

## 2021-07-24 NOTE — Telephone Encounter (Signed)
Attempted several times to contact pt with no response.  Mailing a letter.  FYI to Dr. Darnell Level.

## 2021-08-17 ENCOUNTER — Ambulatory Visit: Payer: BC Managed Care – PPO | Admitting: Family Medicine

## 2021-08-19 ENCOUNTER — Other Ambulatory Visit: Payer: Self-pay

## 2021-08-19 NOTE — Telephone Encounter (Signed)
Spoke with pt confirming he uses [facility].  Pt states he does and asks that rx be sent there.  Eliquis Last rx:  06/30/21, #60/5 Last OV:  01/14/21, hosp f/u Next OV:  08/25/21, hosp f/u

## 2021-08-20 MED ORDER — APIXABAN 5 MG PO TABS: 5.0000 mg | ORAL_TABLET | Freq: Two times a day (BID) | ORAL | 5 refills | Status: DC

## 2021-08-25 ENCOUNTER — Ambulatory Visit (INDEPENDENT_AMBULATORY_CARE_PROVIDER_SITE_OTHER): Payer: Self-pay | Admitting: Family Medicine

## 2021-08-25 ENCOUNTER — Other Ambulatory Visit: Payer: Self-pay

## 2021-08-25 ENCOUNTER — Encounter: Payer: Self-pay | Admitting: Family Medicine

## 2021-08-25 VITALS — BP 136/70 | HR 77 | Temp 97.9°F | Ht 74.0 in | Wt 225.6 lb

## 2021-08-25 DIAGNOSIS — E213 Hyperparathyroidism, unspecified: Secondary | ICD-10-CM

## 2021-08-25 DIAGNOSIS — K631 Perforation of intestine (nontraumatic): Secondary | ICD-10-CM

## 2021-08-25 DIAGNOSIS — E785 Hyperlipidemia, unspecified: Secondary | ICD-10-CM

## 2021-08-25 DIAGNOSIS — E1069 Type 1 diabetes mellitus with other specified complication: Secondary | ICD-10-CM

## 2021-08-25 DIAGNOSIS — D539 Nutritional anemia, unspecified: Secondary | ICD-10-CM

## 2021-08-25 DIAGNOSIS — E559 Vitamin D deficiency, unspecified: Secondary | ICD-10-CM

## 2021-08-25 DIAGNOSIS — E108 Type 1 diabetes mellitus with unspecified complications: Secondary | ICD-10-CM

## 2021-08-25 DIAGNOSIS — E538 Deficiency of other specified B group vitamins: Secondary | ICD-10-CM

## 2021-08-25 DIAGNOSIS — F1020 Alcohol dependence, uncomplicated: Secondary | ICD-10-CM

## 2021-08-25 DIAGNOSIS — K219 Gastro-esophageal reflux disease without esophagitis: Secondary | ICD-10-CM

## 2021-08-25 DIAGNOSIS — E519 Thiamine deficiency, unspecified: Secondary | ICD-10-CM

## 2021-08-25 DIAGNOSIS — F17200 Nicotine dependence, unspecified, uncomplicated: Secondary | ICD-10-CM

## 2021-08-25 DIAGNOSIS — R946 Abnormal results of thyroid function studies: Secondary | ICD-10-CM

## 2021-08-25 DIAGNOSIS — I4891 Unspecified atrial fibrillation: Secondary | ICD-10-CM

## 2021-08-25 LAB — CBC WITH DIFFERENTIAL/PLATELET
Basophils Absolute: 0 10*3/uL (ref 0.0–0.1)
Basophils Relative: 0.5 % (ref 0.0–3.0)
Eosinophils Absolute: 0.4 10*3/uL (ref 0.0–0.7)
Eosinophils Relative: 5.4 % — ABNORMAL HIGH (ref 0.0–5.0)
HCT: 42.5 % (ref 39.0–52.0)
Hemoglobin: 13.9 g/dL (ref 13.0–17.0)
Lymphocytes Relative: 18.5 % (ref 12.0–46.0)
Lymphs Abs: 1.4 10*3/uL (ref 0.7–4.0)
MCHC: 32.7 g/dL (ref 30.0–36.0)
MCV: 91.5 fl (ref 78.0–100.0)
Monocytes Absolute: 0.4 10*3/uL (ref 0.1–1.0)
Monocytes Relative: 5.9 % (ref 3.0–12.0)
Neutro Abs: 5.3 10*3/uL (ref 1.4–7.7)
Neutrophils Relative %: 69.7 % (ref 43.0–77.0)
Platelets: 288 10*3/uL (ref 150.0–400.0)
RBC: 4.64 Mil/uL (ref 4.22–5.81)
RDW: 13.9 % (ref 11.5–15.5)
WBC: 7.6 10*3/uL (ref 4.0–10.5)

## 2021-08-25 LAB — COMPREHENSIVE METABOLIC PANEL
ALT: 11 U/L (ref 0–53)
AST: 12 U/L (ref 0–37)
Albumin: 3.7 g/dL (ref 3.5–5.2)
Alkaline Phosphatase: 82 U/L (ref 39–117)
BUN: 21 mg/dL (ref 6–23)
CO2: 28 mEq/L (ref 19–32)
Calcium: 9.6 mg/dL (ref 8.4–10.5)
Chloride: 100 mEq/L (ref 96–112)
Creatinine, Ser: 0.88 mg/dL (ref 0.40–1.50)
GFR: 94.52 mL/min (ref >60.00)
Glucose, Bld: 292 mg/dL — ABNORMAL HIGH (ref 70–99)
Potassium: 4.6 mEq/L (ref 3.5–5.1)
Sodium: 133 mEq/L — ABNORMAL LOW (ref 135–145)
Total Bilirubin: 0.4 mg/dL (ref 0.2–1.2)
Total Protein: 6.7 g/dL (ref 6.0–8.3)

## 2021-08-25 LAB — PHOSPHORUS: Phosphorus: 4.3 mg/dL (ref 2.3–4.6)

## 2021-08-25 LAB — VITAMIN B12: Vitamin B-12: 123 pg/mL — ABNORMAL LOW (ref 211–911)

## 2021-08-25 LAB — T4, FREE: Free T4: 1.66 ng/dL — ABNORMAL HIGH (ref 0.60–1.60)

## 2021-08-25 LAB — MAGNESIUM: Magnesium: 1.7 mg/dL (ref 1.5–2.5)

## 2021-08-25 LAB — FOLATE: Folate: 22 ng/mL (ref >5.9)

## 2021-08-25 LAB — VITAMIN D 25 HYDROXY (VIT D DEFICIENCY, FRACTURES): VITD: 24.51 ng/mL — ABNORMAL LOW (ref 30.00–100.00)

## 2021-08-25 LAB — HEMOGLOBIN A1C: Hgb A1c MFr Bld: 7.7 % — ABNORMAL HIGH (ref 4.6–6.5)

## 2021-08-25 LAB — TSH: TSH: 0.01 u[IU]/mL — ABNORMAL LOW (ref 0.35–5.50)

## 2021-08-25 MED ORDER — MAGNESIUM OXIDE -MG SUPPLEMENT 400 (240 MG) MG PO TABS: 400.0000 mg | ORAL_TABLET | Freq: Every day | ORAL | 6 refills | Status: DC

## 2021-08-25 MED ORDER — POTASSIUM CHLORIDE CRYS ER 20 MEQ PO TBCR: 20.0000 meq | EXTENDED_RELEASE_TABLET | Freq: Every day | ORAL | 3 refills | Status: DC

## 2021-08-25 MED ORDER — VITAMIN D (ERGOCALCIFEROL) 1.25 MG (50000 UNIT) PO CAPS: 50000.0000 [IU] | ORAL_CAPSULE | ORAL | 1 refills | Status: DC

## 2021-08-25 MED ORDER — TORSEMIDE 20 MG PO TABS: 20.0000 mg | ORAL_TABLET | Freq: Every day | ORAL | 11 refills | Status: DC

## 2021-08-25 MED ORDER — PANTOPRAZOLE SODIUM 40 MG PO TBEC: 40.0000 mg | DELAYED_RELEASE_TABLET | Freq: Every day | ORAL | 3 refills | Status: DC

## 2021-08-25 MED ORDER — FOLIC ACID 1 MG PO TABS: 1.0000 mg | ORAL_TABLET | Freq: Every day | ORAL | 3 refills | Status: DC

## 2021-08-25 NOTE — Progress Notes (Signed)
Patient ID: Russell Gomez, male    DOB: 12-21-1960, 60 y.o.   MRN: [id number]  This visit was conducted in person.  BP 136/70   Pulse 77   Temp 97.9 F (36.6 C) (Temporal)   Ht 6\' 2"  (1.88 m)   Wt 225 lb 9 oz (102.3 kg)   SpO2 94%   BMI 28.96 kg/m    CC: hosp f/u visit  Subjective:   HPI: Russell Gomez is a 60 y.o. male presenting on 08/25/2021 for Hospitalization Follow-up (Admitted on 06/25/21 to [facility], dx debility. )   Russell Gomez  has a past medical history of Alcohol use disorder, moderate, dependence ([place]) (04/25/2020), Arthritis, Atrial fibrillation with RVR ([place]) (12/27/2020), Dental crowns present, Diabetes type 1, uncontrolled, Esophageal reflux, Folate deficiency (04/19/2020), Goiter (04/2012), High cholesterol, HTN (hypertension), Hyperlipidemia due to type 1 diabetes mellitus ([place]) (10/22/2010), Hyperparathyroidism ([place]) (04/19/2020), Thiamine deficiency (04/28/2020), TOBACCO ABUSE (10/22/2010), Vitamin B12 deficiency (04/19/2020), and Vitamin D deficiency (04/19/2020).  Recent hospitalization for diverticulitis with perforation s/p sigmoid colon resection colostomy complicated by acute blood loss anemia and orthostasis maintained with midodrine. For afib, diltiazem and amiodarone and eliquis were continued. Alcohol withdrawal treated with CIWA protocol. He also developed aspiration pneumonia, s/p treatment with augmentin course. Transferred to comprehensive rehab program from August 18th through 24.  Hospital records reviewed. Med rec performed.   Since home feeling easily tired and fatigued.  He fully quit alcohol 06/14/2021. Now back together with wife since he quit drinking.  Continues smoking 1.5 ppd.  Has been approved for medicaid - but doesn't think he have this yet.   DM - followed by endo Dr Honor Junes. Currently on Novolin N 15u BID, Novolin R per SSI (due to cost/affordability). Needs to schedule endo f/u appointment. Continues glucometer use ([place])   Home  health not set up.  Other follow up appointments scheduled: none  He's since seen general surgeon with good report. Planning colostomy reversal 12/2021.  Missed PM&R f/u appt.  Missed cardiology appt while hospitalized Rockey Situ) Not fasting today  ___________________________________________________________________ Hospital admission: 06/14/2021 Hospital discharge: 07/01/2021 TCM f/u phone call: not performed   Discharge Diagnoses:  Principal Problem:   Debility DVT prophylaxis PAF Diverticulosis/colostomy Acute blood loss anemia Alcohol withdrawal Aspiration pneumonia Diabetes mellitus Tobacco use Hypokalemia   Discharged Condition: Stable  Disposition: Discharged home  Diet: Carb modified  Special Instructions: No driving smoking or alcohol Routine colostomy care      Relevant past medical, surgical, family and social history reviewed and updated as indicated. Interim medical history since our last visit reviewed. Allergies and medications reviewed and updated. Outpatient Medications Prior to Visit  Medication Sig Dispense Refill   amiodarone (PACERONE) 200 MG tablet Take 1 tablet (200 mg total) by mouth daily. 30 tablet 0   apixaban (ELIQUIS) 5 MG TABS tablet Take 1 tablet (5 mg total) by mouth 2 (two) times daily. 60 tablet 5   insulin aspart (NOVOLOG) 100 UNIT/ML FlexPen 1 unit per 15 g of carb.  Max daily dose 50 units.     insulin glargine (SEMGLEE, YFGN,) 100 UNIT/ML Solostar Pen Inject 15 Units into the skin daily. (Patient taking differently: Inject 15 Units into the skin daily. Takes twice a day) 15 mL 11   insulin NPH Human (NOVOLIN N) 100 UNIT/ML injection Inject 0.15 mLs (15 Units total) into the skin daily.     Multiple Vitamin (MULTIVITAMIN WITH MINERALS) TABS tablet Take 1 tablet by mouth daily.  pravastatin (PRAVACHOL) 80 MG tablet Take 1 tablet (80 mg total) by mouth daily. 90 tablet 0   folic acid (FOLVITE) 1 MG tablet Take 1 tablet (1 mg total) by mouth  daily. 30 tablet 0   magnesium oxide (MAG-OX) 400 (240 Mg) MG tablet Take 1 tablet (400 mg total) by mouth daily. 30 tablet 0   pantoprazole (PROTONIX) 40 MG tablet Take 1 tablet (40 mg total) by mouth at bedtime. 30 tablet 0   potassium chloride SA (KLOR-CON M20) 20 MEQ tablet Take 1 tablet (20 mEq total) by mouth daily. 30 tablet 0   torsemide (DEMADEX) 20 MG tablet Take 1 tablet (20 mg total) by mouth daily. 30 tablet 0   Vitamin D, Ergocalciferol, (DRISDOL) 1.25 MG (50000 UNIT) CAPS capsule Take 1 capsule (50,000 Units total) by mouth every 7 (seven) days. 5 capsule 0   acetaminophen (TYLENOL) 325 MG tablet Take 2 tablets (650 mg total) by mouth every 6 (six) hours as needed for mild pain (or temp > 100).     diazepam (VALIUM) 5 MG tablet Take 0.5 tablets (2.5 mg total) by mouth at bedtime. (Patient not taking: Reported on 08/25/2021) 30 tablet 0   diltiazem (CARDIZEM CD) 180 MG 24 hr capsule Take 1 capsule (180 mg total) by mouth daily. (Patient not taking: Reported on 08/25/2021) 30 capsule 0   nicotine (NICODERM CQ - DOSED IN MG/24 HOURS) 21 mg/24hr patch 21 mg patch daily x1 week then 14 mg patch daily x3 weeks and stop then 7 mg patch daily x3 weeks and stop 28 patch 0   No facility-administered medications prior to visit.     Per HPI unless specifically indicated in ROS section below Review of Systems  Objective:  BP 136/70   Pulse 77   Temp 97.9 F (36.6 C) (Temporal)   Ht 6\' 2"  (1.88 m)   Wt 225 lb 9 oz (102.3 kg)   SpO2 94%   BMI 28.96 kg/m   Wt Readings from Last 3 Encounters:  08/25/21 225 lb 9 oz (102.3 kg)  06/30/21 221 lb 5.5 oz (100.4 kg)  06/25/21 241 lb 11.2 oz (109.6 kg)      Physical Exam Vitals and nursing note reviewed.  Constitutional:      Appearance: Normal appearance. He is not ill-appearing.  Cardiovascular:     Rate and Rhythm: Normal rate and regular rhythm.     Pulses: Normal pulses.     Heart sounds: Normal heart sounds. No murmur  heard. Pulmonary:     Effort: Pulmonary effort is normal. No respiratory distress.     Breath sounds: Normal breath sounds. No wheezing, rhonchi or rales.  Abdominal:     General: Bowel sounds are normal. There is no distension.     Palpations: Abdomen is soft. There is no mass.     Tenderness: There is no abdominal tenderness. There is no guarding or rebound.     Hernia: No hernia is present.     Comments: Colostomy in place  Musculoskeletal:     Right lower leg: No edema.     Left lower leg: No edema.  Skin:    General: Skin is warm and dry.     Findings: No rash.  Neurological:     Mental Status: He is alert.  Psychiatric:        Mood and Affect: Mood normal.        Behavior: Behavior normal.      Results for orders placed or  performed in visit on 08/25/21  Phosphorus  Result Value Ref Range   Phosphorus 4.3 2.3 - 4.6 mg/dL  Magnesium  Result Value Ref Range   Magnesium 1.7 1.5 - 2.5 mg/dL  Comprehensive metabolic panel  Result Value Ref Range   Sodium 133 (L) 135 - 145 mEq/L   Potassium 4.6 3.5 - 5.1 mEq/L   Chloride 100 96 - 112 mEq/L   CO2 28 19 - 32 mEq/L   Glucose, Bld 292 (H) 70 - 99 mg/dL   BUN 21 6 - 23 mg/dL   Creatinine, Ser 0.88 0.40 - 1.50 mg/dL   Total Bilirubin 0.4 0.2 - 1.2 mg/dL   Alkaline Phosphatase 82 39 - 117 U/L   AST 12 0 - 37 U/L   ALT 11 0 - 53 U/L   Total Protein 6.7 6.0 - 8.3 g/dL   Albumin 3.7 3.5 - 5.2 g/dL   GFR 94.52 >60.00 mL/min   Calcium 9.6 8.4 - 10.5 mg/dL  T4, free  Result Value Ref Range   Free T4 1.66 (H) 0.60 - 1.60 ng/dL  Hemoglobin A1c  Result Value Ref Range   Hgb A1c MFr Bld 7.7 (H) 4.6 - 6.5 %  TSH  Result Value Ref Range   TSH 0.01 (L) 0.35 - 5.50 uIU/mL  CBC with Differential/Platelet  Result Value Ref Range   WBC 7.6 4.0 - 10.5 K/uL   RBC 4.64 4.22 - 5.81 Mil/uL   Hemoglobin 13.9 13.0 - 17.0 g/dL   HCT 42.5 39.0 - 52.0 %   MCV 91.5 78.0 - 100.0 fl   MCHC 32.7 30.0 - 36.0 g/dL   RDW 13.9 11.5 - 15.5 %    Platelets 288.0 150.0 - 400.0 K/uL   Neutrophils Relative % 69.7 43.0 - 77.0 %   Lymphocytes Relative 18.5 12.0 - 46.0 %   Monocytes Relative 5.9 3.0 - 12.0 %   Eosinophils Relative 5.4 (H) 0.0 - 5.0 %   Basophils Relative 0.5 0.0 - 3.0 %   Neutro Abs 5.3 1.4 - 7.7 K/uL   Lymphs Abs 1.4 0.7 - 4.0 K/uL   Monocytes Absolute 0.4 0.1 - 1.0 K/uL   Eosinophils Absolute 0.4 0.0 - 0.7 K/uL   Basophils Absolute 0.0 0.0 - 0.1 K/uL  Vitamin B12  Result Value Ref Range   Vitamin B-12 123 (L) 211 - 911 pg/mL  Folate  Result Value Ref Range   Folate 22.0 >5.9 ng/mL  VITAMIN D 25 Hydroxy (Vit-D Deficiency, Fractures)  Result Value Ref Range   VITD 24.51 (L) 30.00 - 100.00 ng/mL  Parathyroid hormone, intact (no Ca)  Result Value Ref Range   PTH 25 16 - 77 pg/mL    Assessment & Plan:  This visit occurred during the SARS-CoV-2 public health emergency.  Safety protocols were in place, including screening questions prior to the visit, additional usage of staff PPE, and extensive cleaning of exam room while observing appropriate contact time as indicated for disinfecting solutions.   Problem List Items Addressed This Visit     Type 1 diabetes mellitus with complications ([place])    Followed by endo Honor Junes). Recently uncontrolled, was unable to afford basal insulin so currently on Novolin N and R. Using glucometer in place of CGM. Encouraged endo f/u. Marland Kitchen       Relevant Medications   insulin aspart (NOVOLOG) 100 UNIT/ML FlexPen   insulin NPH Human (NOVOLIN N) 100 UNIT/ML injection   Hyperlipidemia due to type 1 diabetes mellitus ([place])  Update FLP on statin       Relevant Medications   insulin aspart (NOVOLOG) 100 UNIT/ML FlexPen   torsemide (DEMADEX) 20 MG tablet   insulin NPH Human (NOVOLIN N) 100 UNIT/ML injection   TOBACCO ABUSE    Precontemplative. Encourage full cessation      GERD    Continue pantoprazole 40mg  daily.       Relevant Medications   pantoprazole (PROTONIX) 40 MG  tablet   Abnormal thyroid function test    Update levels.       Macrocytic anemia    Update levels.       Relevant Medications   folic acid (FOLVITE) 1 MG tablet   Vitamin B12 deficiency    Update levels off replacement      Vitamin D deficiency    Update levels on 50k units weekly.       Folate deficiency    Update levels on daily folate       Hyperparathyroidism ([place])    Update PTH and vit D and Ca      Alcohol use disorder, moderate, dependence ([place])    Full cessation after this hospitalization.  Encouraged continued abstinence.       Thiamine deficiency    Update levels off replacement.       Atrial fibrillation with RVR ([facility])    Continue eliquis amiodarone.  Missed last cards appt due to hospitalization - encouraged he call Dr Donivan Scull office to schedule appt       Relevant Medications   torsemide (DEMADEX) 20 MG tablet   Hypophosphatemia   Bowel perforation ([place]) - Primary    S/p sigmoid colon resection with colostomy with planned reversal later this year. Appreciate general surgery care.       RESOLVED: Hypomagnesemia     Meds ordered this encounter  Medications   folic acid (FOLVITE) 1 MG tablet    Sig: Take 1 tablet (1 mg total) by mouth daily.    Dispense:  90 tablet    Refill:  3   pantoprazole (PROTONIX) 40 MG tablet    Sig: Take 1 tablet (40 mg total) by mouth at bedtime.    Dispense:  90 tablet    Refill:  3   potassium chloride SA (KLOR-CON M20) 20 MEQ tablet    Sig: Take 1 tablet (20 mEq total) by mouth daily.    Dispense:  90 tablet    Refill:  3   torsemide (DEMADEX) 20 MG tablet    Sig: Take 1 tablet (20 mg total) by mouth daily.    Dispense:  30 tablet    Refill:  11   Vitamin D, Ergocalciferol, (DRISDOL) 1.25 MG (50000 UNIT) CAPS capsule    Sig: Take 1 capsule (50,000 Units total) by mouth every 7 (seven) days.    Dispense:  12 capsule    Refill:  1   magnesium oxide (MAG-OX) 400 (240 Mg) MG tablet    Sig: Take 1 tablet  (400 mg total) by mouth daily.    Dispense:  30 tablet    Refill:  6   No orders of the defined types were placed in this encounter.   Patient Instructions  Call to schedule appointments with Dr Rockey Situ and Dr Honor Junes Schedule eye exam as you're due.  Congratulations on quitting alcohol!  Continue current medicines.   Follow up plan: Return in about 3 months (around 11/25/2021) for annual exam, prior fasting for blood work.  Ria Bush, MD

## 2021-08-25 NOTE — Patient Instructions (Addendum)
Call to schedule appointments with Dr Rockey Situ and Dr Honor Junes Schedule eye exam as you're due.  Congratulations on quitting alcohol!  Continue current medicines.

## 2021-08-28 ENCOUNTER — Encounter: Payer: Self-pay | Admitting: Family Medicine

## 2021-08-28 ENCOUNTER — Telehealth: Payer: Self-pay

## 2021-08-28 NOTE — Assessment & Plan Note (Signed)
Update levels off replacement. 

## 2021-08-28 NOTE — Assessment & Plan Note (Signed)
Full cessation after this hospitalization.  Encouraged continued abstinence.

## 2021-08-28 NOTE — Assessment & Plan Note (Signed)
Update PTH and vit D and Ca

## 2021-08-28 NOTE — Assessment & Plan Note (Signed)
Precontemplative. Encourage full cessation

## 2021-08-28 NOTE — Assessment & Plan Note (Signed)
S/p sigmoid colon resection with colostomy with planned reversal later this year. Appreciate general surgery care.

## 2021-08-28 NOTE — Telephone Encounter (Addendum)
Attempted to contact pt.  No vm.  Need to relay results, Dr. Synthia Innocent message and schedule vit B12 shots. (See Labs, 08/25/21)  Labs/Dr. Synthia Innocent msg: Plz notify blood counts returned ok as did kidneys and liver. A1c is improving at 7.7%.  Thyroid was overactive - please call to schedule f/u with endocrinology for hyperthyroidism and diabetes.  Vit D remains low - continue 50k weekly replacement.  Vit b12 was very low - rec schedule weekly B12 shots for 1 month, THEN monthly B12 shots for 6 months then recheck levels.

## 2021-08-28 NOTE — Assessment & Plan Note (Addendum)
Update levels. 

## 2021-08-28 NOTE — Assessment & Plan Note (Signed)
Followed by endo Honor Junes). Recently uncontrolled, was unable to afford basal insulin so currently on Novolin N and R. Using glucometer in place of CGM. Encouraged endo f/u. Marland Kitchen

## 2021-08-28 NOTE — Assessment & Plan Note (Signed)
Update levels on daily folate

## 2021-08-28 NOTE — Assessment & Plan Note (Addendum)
Continue eliquis amiodarone.  Missed last cards appt due to hospitalization - encouraged he call Dr Donivan Scull office to schedule appt

## 2021-08-28 NOTE — Assessment & Plan Note (Signed)
Continue pantoprazole 40 mg daily.  ?

## 2021-08-28 NOTE — Assessment & Plan Note (Addendum)
Update FLP on statin

## 2021-08-28 NOTE — Assessment & Plan Note (Signed)
Update levels on 50k units weekly.

## 2021-08-28 NOTE — Assessment & Plan Note (Signed)
Update levels. 

## 2021-08-30 LAB — VITAMIN B1: Vitamin B1 (Thiamine): 16 nmol/L (ref 8–30)

## 2021-08-30 LAB — PARATHYROID HORMONE, INTACT (NO CA): PTH: 25 pg/mL (ref 16–77)

## 2021-09-03 NOTE — Telephone Encounter (Signed)
Spoke with pt relaying results and Dr. Synthia Innocent message.  Pt verbalizes understanding.  Schedule 1st of 4 wkly vit B12 shots on 09/10/21 at 9:45 @ [facility].

## 2021-09-10 ENCOUNTER — Other Ambulatory Visit: Payer: Self-pay

## 2021-09-10 ENCOUNTER — Ambulatory Visit (INDEPENDENT_AMBULATORY_CARE_PROVIDER_SITE_OTHER): Payer: Self-pay

## 2021-09-10 DIAGNOSIS — E538 Deficiency of other specified B group vitamins: Secondary | ICD-10-CM

## 2021-09-10 MED ORDER — CYANOCOBALAMIN 1000 MCG/ML IJ SOLN
1000.0000 ug | Freq: Once | INTRAMUSCULAR | Status: AC
  Administered 2021-09-10: 1000 ug via INTRAMUSCULAR

## 2021-09-10 NOTE — Progress Notes (Signed)
Per orders of Dr. Sharlene Motts, injection of vit I71 given by Brenton Grills. Patient tolerated injection well.

## 2021-09-17 ENCOUNTER — Ambulatory Visit: Payer: Medicaid Other

## 2021-10-06 ENCOUNTER — Other Ambulatory Visit: Payer: Self-pay

## 2021-10-06 ENCOUNTER — Ambulatory Visit (INDEPENDENT_AMBULATORY_CARE_PROVIDER_SITE_OTHER): Payer: Self-pay

## 2021-10-06 DIAGNOSIS — E538 Deficiency of other specified B group vitamins: Secondary | ICD-10-CM

## 2021-10-06 MED ORDER — CYANOCOBALAMIN 1000 MCG/ML IJ SOLN
1000.0000 ug | Freq: Once | INTRAMUSCULAR | Status: AC
  Administered 2021-10-06: 1000 ug via INTRAMUSCULAR

## 2021-10-06 NOTE — Progress Notes (Signed)
Per orders of Dr. Danise Mina, 2nd weekly injection of b12 given by Loreen Freud. Patient tolerated injection well. Pt will schedule the next 2 weekly injections today.

## 2021-10-13 ENCOUNTER — Ambulatory Visit (INDEPENDENT_AMBULATORY_CARE_PROVIDER_SITE_OTHER): Payer: Self-pay

## 2021-10-13 ENCOUNTER — Other Ambulatory Visit: Payer: Self-pay

## 2021-10-13 DIAGNOSIS — E538 Deficiency of other specified B group vitamins: Secondary | ICD-10-CM

## 2021-10-13 MED ORDER — CYANOCOBALAMIN 1000 MCG/ML IJ SOLN
1000.0000 ug | Freq: Once | INTRAMUSCULAR | Status: AC
  Administered 2021-10-13: 1000 ug via INTRAMUSCULAR

## 2021-10-13 NOTE — Progress Notes (Signed)
Per orders of Dr. Danise Mina, an injection of B12 was given in right deltoid by Ophelia Shoulder, CMA.  Patient tolerated injection well.

## 2021-10-20 ENCOUNTER — Ambulatory Visit (INDEPENDENT_AMBULATORY_CARE_PROVIDER_SITE_OTHER): Payer: Medicaid Other

## 2021-10-20 ENCOUNTER — Other Ambulatory Visit: Payer: Self-pay

## 2021-10-20 DIAGNOSIS — E538 Deficiency of other specified B group vitamins: Secondary | ICD-10-CM

## 2021-10-20 MED ORDER — CYANOCOBALAMIN 1000 MCG/ML IJ SOLN
1000.0000 ug | Freq: Once | INTRAMUSCULAR | Status: AC
  Administered 2021-10-20: 1000 ug via INTRAMUSCULAR

## 2021-10-20 NOTE — Progress Notes (Signed)
Per orders of Dr. Danise Mina, 4/4 weekly injection of B12 given by Loreen Freud. Patient tolerated injection well. Pt now going to monthly injections.

## 2021-10-26 ENCOUNTER — Ambulatory Visit: Payer: Self-pay | Admitting: General Surgery

## 2021-10-27 ENCOUNTER — Telehealth: Payer: Self-pay | Admitting: Cardiovascular Disease

## 2021-10-27 NOTE — Telephone Encounter (Signed)
Spoke with patient and scheduled pre op clearance appointment with Christell Faith, PA on 11/11/21 at 2:45pm.   Patient agreeable and voiced understanding.

## 2021-10-27 NOTE — Telephone Encounter (Signed)
Primary Cardiologist: Dr. Rockey Situ or Dr. Clayborn Bigness  Chart reviewed as part of pre-operative protocol coverage. Because of Russell Gomez past medical history and time since last visit, he/she will require a follow-up visit in order to better assess preoperative cardiovascular risk.  Pt was last seen by Dr. Rockey Situ in February 2022 for post-hospital follow-up. He was also seen by Dr. Clayborn Bigness with [facility] during hospitalization in August 2022. Pt will need to schedule an appointment for pre-op surgical clearance, as it has been greater than 6 months since last visit. However, he needs to decide whether he would like to follow with Dr. Rockey Situ or Dr. Clayborn Bigness as his primary cardiologist.    Lenna Sciara, NP  10/27/2021, 10:42 AM

## 2021-10-27 NOTE — Telephone Encounter (Signed)
° °  Pre-operative Risk Assessment    Patient Name: Russell Gomez  DOB: 09/30/1961 MRN: [id number]      Request for Surgical Clearance    Procedure:   Colonoscopy and colostomy takedown  Date of Surgery:  Clearance 11/19/21  AND 11/30/21                         Surgeon:  Dr. Tarri Fuller Surgeon's Group or Practice Name:  [facility]  Phone number:  [phone] Fax number:  [phone]   Type of Clearance Requested:   - Medical  and pharmacy    Type of Anesthesia:  Not Indicated   Additional requests/questions:   Hold Eliquis x 3 days for each procedure   Jonathon Jordan   10/27/2021, 10:21 AM
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
## 2021-11-11 ENCOUNTER — Ambulatory Visit (INDEPENDENT_AMBULATORY_CARE_PROVIDER_SITE_OTHER): Payer: BC Managed Care – PPO | Admitting: Physician Assistant

## 2021-11-11 ENCOUNTER — Other Ambulatory Visit: Payer: Self-pay

## 2021-11-11 ENCOUNTER — Encounter: Payer: Self-pay | Admitting: Physician Assistant

## 2021-11-11 VITALS — BP 130/70 | HR 69 | Ht 74.0 in | Wt 221.0 lb

## 2021-11-11 DIAGNOSIS — I480 Paroxysmal atrial fibrillation: Secondary | ICD-10-CM | POA: Diagnosis not present

## 2021-11-11 DIAGNOSIS — Z01810 Encounter for preprocedural cardiovascular examination: Secondary | ICD-10-CM | POA: Diagnosis not present

## 2021-11-11 DIAGNOSIS — Z87898 Personal history of other specified conditions: Secondary | ICD-10-CM

## 2021-11-11 DIAGNOSIS — I10 Essential (primary) hypertension: Secondary | ICD-10-CM

## 2021-11-11 DIAGNOSIS — E0590 Thyrotoxicosis, unspecified without thyrotoxic crisis or storm: Secondary | ICD-10-CM | POA: Diagnosis not present

## 2021-11-11 DIAGNOSIS — Z720 Tobacco use: Secondary | ICD-10-CM

## 2021-11-11 MED ORDER — METOPROLOL SUCCINATE ER 25 MG PO TB24: 25.0000 mg | ORAL_TABLET | Freq: Every day | ORAL | 2 refills | Status: DC

## 2021-11-11 NOTE — Patient Instructions (Signed)
Medication Instructions:  - Your physician has recommended you make the following change in your medication:   1) You will remain off of amiodarone- this has been removed from your medication list  2) START Toprol XL (metoprolol succinate) 25 mg: - take 1 tablet by mouth ONCE daily   *If you need a refill on your cardiac medications before your next appointment, please call your pharmacy*   Lab Work: - none ordered  If you have labs (blood work) drawn today and your tests are completely normal, you will receive your results only by: MyChart Message (if you have MyChart) OR A paper copy in the mail If you have any lab test that is abnormal or we need to change your treatment, we will call you to review the results.   Testing/Procedures: - none ordered   Follow-Up: At [facility], you and your health needs are our priority.  As part of our continuing mission to provide you with exceptional heart care, we have created designated Provider Care Teams.  These Care Teams include your primary Cardiologist (physician) and Advanced Practice Providers (APPs -  Physician Assistants and Nurse Practitioners) who all work together to provide you with the care you need, when you need it.  We recommend signing up for the patient portal called "MyChart".  Sign up information is provided on this After Visit Summary.  MyChart is used to connect with patients for Virtual Visits (Telemedicine).  Patients are able to view lab/test results, encounter notes, upcoming appointments, etc.  Non-urgent messages can be sent to your provider as well.   To learn more about what you can do with MyChart, go to [email or web address].    Your next appointment:   3 month(s)  The format for your next appointment:   In Person  Provider:   You may see Ida Rogue, MD or one of the following Advanced Practice Providers on your designated Care Team:    Christell Faith, PA-C    Other Instructions  TOPROL XL  (Metoprolol Extended-Release) Tablets What is this medication? METOPROLOL (me TOE proe lole) treats high blood pressure and heart failure. It may also be used to prevent chest pain (angina). It works by lowering your blood pressure and heart rate, making it easier for your heart to pump blood to the rest of your body. It belongs to a group of medications called beta blockers. This medicine may be used for other purposes; ask your health care provider or pharmacist if you have questions. COMMON BRAND NAME(S): toprol, Toprol XL What should I tell my care team before I take this medication? They need to know if you have any of these conditions: Diabetes Heart or vessel disease like slow heart rate, worsening heart failure, heart block, sick sinus syndrome, or Raynaud's disease Kidney disease Liver disease Lung or breathing disease, like asthma or emphysema Pheochromocytoma Thyroid disease An unusual or allergic reaction to metoprolol, other beta blockers, medications, foods, dyes, or preservatives Pregnant or trying to get pregnant Breast-feeding How should I use this medication? Take this medication by mouth. Take it as directed on the prescription label at the same time every day. Take it with food. You may cut the tablet in half if it is scored (has a line in the middle of it). This may help you swallow the tablet if the whole tablet is too big. Be sure to take both halves. Do not take just one-half of the tablet. Keep taking it unless your care team tells you to  stop. Talk to your care team about the use of this medication in children. While it may be prescribed for children as young as 6 years for selected conditions, precautions do apply. Overdosage: If you think you have taken too much of this medicine contact a poison control center or emergency room at once. NOTE: This medicine is only for you. Do not share this medicine with others. What if I miss a dose? If you miss a dose, take it as  soon as you can. If it is almost time for your next dose, take only that dose. Do not take double or extra doses. What may interact with this medication? This medication may interact with the following: Certain medications for blood pressure, heart disease, irregular heartbeat Certain medications for depression, like monoamine oxidase (MAO) inhibitors, fluoxetine, or paroxetine Clonidine Dobutamine Epinephrine Isoproterenol Reserpine This list may not describe all possible interactions. Give your health care provider a list of all the medicines, herbs, non-prescription drugs, or dietary supplements you use. Also tell them if you smoke, drink alcohol, or use illegal drugs. Some items may interact with your medicine. What should I watch for while using this medication? Visit your care team for regular checks on your progress. Check your blood pressure as directed. Ask your care team what your blood pressure should be. Also, find out when you should contact them. Do not treat yourself for coughs, colds, or pain while you are using this medication without asking your care team for advice. Some medications may increase your blood pressure. You may get drowsy or dizzy. Do not drive, use machinery, or do anything that needs mental alertness until you know how this medication affects you. Do not stand up or sit up quickly, especially if you are an older patient. This reduces the risk of dizzy or fainting spells. Alcohol may interfere with the effect of this medication. Avoid alcoholic drinks. This medication may increase blood sugar. Ask your care team if changes in diet or medications are needed if you have diabetes. What side effects may I notice from receiving this medication? Side effects that you should report to your care team as soon as possible: Allergic reactions--skin rash, itching, hives, swelling of the face, lips, tongue, or throat Heart failure--shortness of breath, swelling of the ankles,  feet, or hands, sudden weight gain, unusual weakness or fatigue Low blood pressure--dizziness, feeling faint or lightheaded, blurry vision Raynaud's--cool, numb, or painful fingers or toes that may change color from pale, to blue, to red Slow heartbeat--dizziness, feeling faint or lightheaded, confusion, trouble breathing, unusual weakness or fatigue Worsening mood, feelings of depression Side effects that usually do not require medical attention (report to your care team if they continue or are bothersome): Change in sex drive or performance Diarrhea Dizziness Fatigue Headache This list may not describe all possible side effects. Call your doctor for medical advice about side effects. You may report side effects to FDA at 1-800-FDA-1088. Where should I keep my medication? Keep out of the reach of children and pets. Store at room temperature between 20 and 25 degrees C (68 and 77 degrees F). Throw away any unused medication after the expiration date. NOTE: This sheet is a summary. It may not cover all possible information. If you have questions about this medicine, talk to your doctor, pharmacist, or health care provider.  2022 Elsevier/Gold Standard (2021-07-14 00:00:00)

## 2021-11-13 NOTE — Addendum Note (Signed)
Addended by: Raelene Bott, Hanaa Payes L on: 11/13/2021 10:57 AM   Modules accepted: Orders

## 2021-11-18 ENCOUNTER — Encounter: Payer: Self-pay | Admitting: Surgery

## 2021-11-19 ENCOUNTER — Ambulatory Visit
Admission: RE | Admit: 2021-11-19 | Discharge: 2021-11-19 | Disposition: A | Discharge status: Home / Self Care | Payer: BC Managed Care – PPO | Attending: Surgery | Admitting: Surgery

## 2021-11-19 ENCOUNTER — Ambulatory Visit: Payer: BC Managed Care – PPO | Admitting: Anesthesiology

## 2021-11-19 ENCOUNTER — Encounter: Payer: Self-pay | Admitting: Surgery

## 2021-11-19 ENCOUNTER — Encounter
Admission: RE | Disposition: A | Discharge status: Home / Self Care | Payer: Self-pay | Source: Home / Self Care | Attending: Surgery

## 2021-11-19 DIAGNOSIS — Z7901 Long term (current) use of anticoagulants: Secondary | ICD-10-CM | POA: Insufficient documentation

## 2021-11-19 DIAGNOSIS — E1065 Type 1 diabetes mellitus with hyperglycemia: Secondary | ICD-10-CM | POA: Diagnosis not present

## 2021-11-19 DIAGNOSIS — I4891 Unspecified atrial fibrillation: Secondary | ICD-10-CM | POA: Diagnosis not present

## 2021-11-19 DIAGNOSIS — F17210 Nicotine dependence, cigarettes, uncomplicated: Secondary | ICD-10-CM | POA: Diagnosis not present

## 2021-11-19 DIAGNOSIS — E7800 Pure hypercholesterolemia, unspecified: Secondary | ICD-10-CM | POA: Diagnosis not present

## 2021-11-19 DIAGNOSIS — K644 Residual hemorrhoidal skin tags: Secondary | ICD-10-CM | POA: Diagnosis not present

## 2021-11-19 DIAGNOSIS — D123 Benign neoplasm of transverse colon: Secondary | ICD-10-CM | POA: Diagnosis not present

## 2021-11-19 DIAGNOSIS — K219 Gastro-esophageal reflux disease without esophagitis: Secondary | ICD-10-CM | POA: Insufficient documentation

## 2021-11-19 DIAGNOSIS — D759 Disease of blood and blood-forming organs, unspecified: Secondary | ICD-10-CM | POA: Diagnosis not present

## 2021-11-19 DIAGNOSIS — D649 Anemia, unspecified: Secondary | ICD-10-CM | POA: Diagnosis not present

## 2021-11-19 DIAGNOSIS — Z09 Encounter for follow-up examination after completed treatment for conditions other than malignant neoplasm: Secondary | ICD-10-CM | POA: Insufficient documentation

## 2021-11-19 DIAGNOSIS — K635 Polyp of colon: Secondary | ICD-10-CM | POA: Diagnosis not present

## 2021-11-19 DIAGNOSIS — I10 Essential (primary) hypertension: Secondary | ICD-10-CM | POA: Insufficient documentation

## 2021-11-19 DIAGNOSIS — K5732 Diverticulitis of large intestine without perforation or abscess without bleeding: Secondary | ICD-10-CM | POA: Insufficient documentation

## 2021-11-19 DIAGNOSIS — M1990 Unspecified osteoarthritis, unspecified site: Secondary | ICD-10-CM | POA: Insufficient documentation

## 2021-11-19 DIAGNOSIS — K7460 Unspecified cirrhosis of liver: Secondary | ICD-10-CM | POA: Diagnosis not present

## 2021-11-19 DIAGNOSIS — Z933 Colostomy status: Secondary | ICD-10-CM | POA: Diagnosis not present

## 2021-11-19 HISTORY — PX: COLONOSCOPY WITH PROPOFOL: SHX5780

## 2021-11-19 LAB — GLUCOSE, CAPILLARY: Glucose-Capillary: 96 mg/dL (ref 70–99)

## 2021-11-19 SURGERY — COLONOSCOPY WITH PROPOFOL
Anesthesia: General

## 2021-11-19 MED ORDER — FENTANYL CITRATE (PF) 100 MCG/2ML IJ SOLN
INTRAMUSCULAR | Status: DC | PRN
  Administered 2021-11-19: 50 ug via INTRAVENOUS

## 2021-11-19 MED ORDER — PROPOFOL 500 MG/50ML IV EMUL
INTRAVENOUS | Status: AC
  Filled 2021-11-19: qty 50

## 2021-11-19 MED ORDER — FENTANYL CITRATE (PF) 100 MCG/2ML IJ SOLN
INTRAMUSCULAR | Status: AC
  Filled 2021-11-19: qty 2

## 2021-11-19 MED ORDER — EPHEDRINE 5 MG/ML INJ
INTRAVENOUS | Status: AC
  Filled 2021-11-19: qty 5

## 2021-11-19 MED ORDER — PROPOFOL 500 MG/50ML IV EMUL
INTRAVENOUS | Status: DC | PRN
  Administered 2021-11-19: 150 ug/kg/min via INTRAVENOUS

## 2021-11-19 MED ORDER — SODIUM CHLORIDE 0.9 % IV SOLN: INTRAVENOUS | Status: DC

## 2021-11-19 MED ORDER — EPHEDRINE SULFATE 50 MG/ML IJ SOLN
INTRAMUSCULAR | Status: DC | PRN
  Administered 2021-11-19 (×3): 5 mg via INTRAVENOUS

## 2021-11-19 NOTE — Anesthesia Preprocedure Evaluation (Signed)
Anesthesia Evaluation  Patient identified by MRN, date of birth, ID band Patient awake    Reviewed: Allergy & Precautions, NPO status , Patient's Chart, lab work & pertinent test resultsPreop documentation limited or incomplete due to emergent nature of procedure.  History of Anesthesia Complications Negative for: history of anesthetic complications  Airway Mallampati: III  TM Distance: <3 FB Neck ROM: Full    Dental  (+) Missing, Poor Dentition, Chipped, Dental Advisory Given Missing several upper and lower teeth:   Pulmonary neg shortness of breath, neg COPD, neg recent URI, Current Smoker and Patient abstained from smoking.,    Pulmonary exam normal        Cardiovascular Exercise Tolerance: Poor hypertension, Pt. on medications and Pt. on home beta blockers (-) angina(-) Past MI and (-) Cardiac Stents Normal cardiovascular exam(-) dysrhythmias (ON ELIQUIS) (-) Valvular Problems/Murmurs     Neuro/Psych PSYCHIATRIC DISORDERS Anxiety Depression negative neurological ROS     GI/Hepatic GERD  Medicated and Controlled,(+) Cirrhosis       ,   Endo/Other  diabetes, Poorly Controlled, Type 1, Insulin Dependent  Renal/GU negative Renal ROS  negative genitourinary   Musculoskeletal  (+) Arthritis ,   Abdominal   Peds  Hematology  (+) Blood dyscrasia, anemia ,   Anesthesia Other Findings .echo 2/22 Left ventricular ejection fraction, by estimation, is 60 to 65%. The  left ventricle has normal function. The left ventricle has no regional  wall motion abnormalities. Left ventricular diastolic parameters were  normal.  2. Right ventricular systolic function is normal. The right ventricular   CHRONIC ETOH size is normal.  3. Left atrial size was mildly dilated.  4. The mitral valve is normal in structure. Mild to moderate mitral valve  regurgitation.  5. The aortic valve is normal in structure. Aortic valve  regurgitation is  not visualized.  \  afib-ON ELIQUIS-TO RECEIVE REVERSAL PRIOR TO or AND HAVE PRBCS AND FFP READY FOR SURGERY  CALCIUM AND MAGNESIUM EXTREMELY LOW  CHRONIC ETOH   Reproductive/Obstetrics negative OB ROS                             Anesthesia Physical  Anesthesia Plan  ASA: 4  Anesthesia Plan: General   Post-op Pain Management:    Induction: Intravenous  PONV Risk Score and Plan: 1 and Propofol infusion and TIVA  Airway Management Planned: Natural Airway and Nasal Cannula  Additional Equipment:   Intra-op Plan:   Post-operative Plan:   Informed Consent: I have reviewed the patients History and Physical, chart, labs and discussed the procedure including the risks, benefits and alternatives for the proposed anesthesia with the patient or authorized representative who has indicated his/her understanding and acceptance.       Plan Discussed with: CRNA  Anesthesia Plan Comments:         Anesthesia Quick Evaluation

## 2021-11-19 NOTE — Op Note (Signed)
[facility] Gastroenterology Patient Name: Russell Gomez Procedure Date: 11/19/2021 1:20 PM MRN: [id number] Account #: [id number] Date of Birth: Mar 12, 1961 Admit Type: Outpatient Age: 61 Room: [facility] ENDO ROOM 3 Gender: Male Note Status: Finalized Instrument Name: Colonscope [id number] Procedure:             Colonoscopy Indications:           Follow-up of diverticulitis Providers:             Eliseo Squires MD, MD Referring MD:          Ria Bush (Referring MD) Medicines:             Propofol per Anesthesia Complications:         No immediate complications. Procedure:             Pre-Anesthesia Assessment:                        - After reviewing the risks and benefits, the patient                         was deemed in satisfactory condition to undergo the                         procedure in an ambulatory setting.                        After obtaining informed consent, the colonoscope was                         passed under direct vision. Throughout the procedure,                         the patient's blood pressure, pulse, and oxygen                         saturations were monitored continuously. The                         Colonoscope was introduced through the sigmoid                         colostomy and advanced to the the cecum, identified by                         the ileocecal valve. The colonoscopy was technically                         difficult and complex due to inadequate bowel prep. Findings:      Skin tags were found on perianal exam.      Four semi-pedunculated polyps were found in the proximal transverse       colon and distal transverse colon. The polyps were 2 to 6 mm in size.       These polyps were removed with a hot snare. Resection and retrieval were       complete. Estimated blood loss was minimal.      A 2 mm polyp was found in the distal transverse colon. The polyp was       sessile. Biopsies were taken with a cold forceps for  histology.  Estimated blood loss: none.      Rectal stump exam attempted but mucus plugs prevented adequate       visualization of the distal staple line Impression:            - Perianal skin tags found on perianal exam.                        - Four 2 to 6 mm polyps in the proximal transverse                         colon and in the distal transverse colon, removed with                         a hot snare. Resected and retrieved.                        - One 2 mm polyp in the distal transverse colon.                         Biopsied. Recommendation:        - Await pathology results.                        - Written discharge instructions were provided to the                         patient.                        - Discharge patient to home.                        - Resume previous diet. Procedure Code(s):     --- Professional ---                        [phone], Colonoscopy through stoma; with removal of                         tumor(s), polyp(s), or other lesion(s) by snare                         technique                        56314, 24, Colonoscopy through stoma; with biopsy,                         single or multiple Diagnosis Code(s):     --- Professional ---                        K63.5, Polyp of colon                        K64.4, Residual hemorrhoidal skin tags                        K57.32, Diverticulitis of large intestine without                         perforation or abscess  without bleeding CPT copyright 2019 American Medical Association. All rights reserved. The codes documented in this report are preliminary and upon coder review may  be revised to meet current compliance requirements. Dr. Sheppard Penton, MD Eliseo Squires MD, MD 11/19/2021 2:23:27 PM This report has been signed electronically. Number of Addenda: 0 Note Initiated On: 11/19/2021 1:20 PM Scope Withdrawal Time: 0 hours 15 minutes 37 seconds  Total Procedure Duration: 0 hours 46 minutes 0 seconds   Estimated Blood Loss:  Estimated blood loss was minimal.      [facility]

## 2021-11-19 NOTE — Transfer of Care (Signed)
Immediate Anesthesia Transfer of Care Note  Patient: Russell Gomez  Procedure(s) Performed: COLONOSCOPY WITH PROPOFOL  Patient Location: PACU  Anesthesia Type:General  Level of Consciousness: awake and sedated  Airway & Oxygen Therapy: Patient Spontanous Breathing and Patient connected to nasal cannula oxygen  Post-op Assessment: Report given to RN and Post -op Vital signs reviewed and stable  Post vital signs: Reviewed and stable  Last Vitals:  Vitals Value Taken Time  BP    Temp    Pulse    Resp    SpO2      Last Pain:  Vitals:   11/19/21 1157  TempSrc: Temporal  PainSc: 0-No pain         Complications: No notable events documented.
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
## 2021-11-19 NOTE — Anesthesia Procedure Notes (Signed)
Date/Time: 11/19/2021 1:23 PM Performed by: Vaughan Sine Pre-anesthesia Checklist: Patient identified, Emergency Drugs available, Suction available, Patient being monitored and Timeout performed Patient Re-evaluated:Patient Re-evaluated prior to induction Oxygen Delivery Method: Nasal cannula Preoxygenation: Pre-oxygenation with 100% oxygen Induction Type: IV induction Placement Confirmation: positive ETCO2 and CO2 detector

## 2021-11-20 ENCOUNTER — Other Ambulatory Visit: Payer: Self-pay

## 2021-11-20 ENCOUNTER — Telehealth: Payer: Self-pay | Admitting: Cardiovascular Disease

## 2021-11-20 ENCOUNTER — Encounter: Payer: Self-pay | Admitting: Surgery

## 2021-11-20 ENCOUNTER — Other Ambulatory Visit
Admission: RE | Admit: 2021-11-20 | Discharge: 2021-11-20 | Disposition: A | Discharge status: Home / Self Care | Payer: BC Managed Care – PPO | Source: Ambulatory Visit | Attending: General Surgery | Admitting: General Surgery

## 2021-11-20 HISTORY — DX: Cardiac arrhythmia, unspecified: I49.9

## 2021-11-20 NOTE — Telephone Encounter (Signed)
Patient states that he is currently taking metoprolol tartrate 100 mg once daily. Chart review shows that at last visit he was started on Toprol XL 25 mg once a day. Patient states that when he saw Dr. Rockey Situ last he was started on Metoprolol tartrate 100 mg once a day. 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 So he wants to know what he should be taking. Advised that I would send to provider for his review and recommendations and then give him a call back with further instructions. He verbalized understanding with no further questions at this time.

## 2021-11-20 NOTE — Telephone Encounter (Signed)
Patient's Lopressor 100 mg twice daily was discontinued during his initial hospitalization in August.  This medication was also not on his discharge summary during his second admission in August.  It was not on his medication list at his office visit with [place] and he did not report he was taking this.  We need to know if he was definitively taking Toprol tartrate at the time of his visit on 11/11/2021, and if so, what was his dose and how many times per day was he taking this medication.  Can provide further recommendations based on an accurate/updated medication list.

## 2021-11-20 NOTE — Telephone Encounter (Signed)
No answer/voicemail box has not been set up.

## 2021-11-20 NOTE — Pre-Procedure Instructions (Signed)
Cardiac clearance on chart from Christell Faith PA-Pt is moderate risk

## 2021-11-20 NOTE — Telephone Encounter (Signed)
Please call to discuss Metoprolol. States he currently takes 100 mg.

## 2021-11-20 NOTE — Patient Instructions (Addendum)
Your procedure is scheduled on: 11/30/21 - Monday Report to the Registration Desk on the 1st floor of the [facility]. To find out your arrival time, please call [phone] between 1PM - 3PM on: 11/27/21 Friday Report to [facility] for Covid Test on 11/26/21  between 8 am and 12 noon.  REMEMBER: Instructions that are not followed completely may result in serious medical risk, up to and including death; or upon the discretion of your surgeon and anesthesiologist your surgery may need to be rescheduled.  Follow Bowel Prep instructions given to you by Dr. Windell Moment.  TAKE THESE MEDICATIONS THE MORNING OF SURGERY WITH A SIP OF WATER:  - metoprolol succinate (TOPROL-XL) 25 MG 24 hr tablet - pravastatin (PRAVACHOL) 80 MG tablet  - insulin NPH Human (NOVOLIN N) 100 UNIT/ML injection - Only take 1/2 of your scheduled dose at bedtime on 01/22.  - insulin regular (NOVOLIN R) 100 units/mL injection - Do Not Take The Morning Of Surgery  Follow recommendations from Cardiologist, Pulmonologist or PCP regarding stopping Aspirin, Coumadin, Plavix, Eliquis, Pradaxa, or Pletal.Stop taking apixaban (ELIQUIS) 5 MG TABS tablet beginning 11/27/21, may resume taking with MD order.  One week prior to surgery: Stop Anti-inflammatories (NSAIDS) such as Advil, Aleve, Ibuprofen, Motrin, Naproxen, Naprosyn and Aspirin based products such as Excedrin, Goodys Powder, BC Powder.  Stop ANY OVER THE COUNTER supplements until after surgery.  You may however, continue to take Tylenol if needed for pain up until the day of surgery.  No Alcohol for 24 hours before or after surgery.  No Smoking including e-cigarettes for 24 hours prior to surgery.  No chewable tobacco products for at least 6 hours prior to surgery.  No nicotine patches on the day of surgery.  Do not use any "recreational" drugs for at least a week prior to your surgery.  Please be advised that the combination of cocaine and anesthesia may  have negative outcomes, up to and including death. If you test positive for cocaine, your surgery will be cancelled.  On the morning of surgery brush your teeth with toothpaste and water, you may rinse your mouth with mouthwash if you wish. Do not swallow any toothpaste or mouthwash.  Use CHG Soap or wipes as directed on instruction sheet.  Do not wear jewelry, make-up, hairpins, clips or nail polish.  Do not wear lotions, powders, or perfumes.   Do not shave body from the neck down 48 hours prior to surgery just in case you cut yourself which could leave a site for infection.  Also, freshly shaved skin may become irritated if using the CHG soap.  Contact lenses, hearing aids and dentures may not be worn into surgery.  Do not bring valuables to the hospital. [facility] is not responsible for any missing/lost belongings or valuables.   Notify your doctor if there is any change in your medical condition (cold, fever, infection).  Wear comfortable clothing (specific to your surgery type) to the hospital.  After surgery, you can help prevent lung complications by doing breathing exercises.  Take deep breaths and cough every 1-2 hours. Your doctor may order a device called an Incentive Spirometer to help you take deep breaths. When coughing or sneezing, hold a pillow firmly against your incision with both hands. This is called splinting. Doing this helps protect your incision. It also decreases belly discomfort.  If you are being admitted to the hospital overnight, leave your suitcase in the car. After surgery it may be brought  to your room.  If you are being discharged the day of surgery, you will not be allowed to drive home. You will need a responsible adult (18 years or older) to drive you home and stay with you that night.   If you are taking public transportation, you will need to have a responsible adult (18 years or older) with you. Please confirm with your physician that it  is acceptable to use public transportation.   Please call the [place] Dept. at [phone] if you have any questions about these instructions.  Surgery Visitation Policy:  Patients undergoing a surgery or procedure may have one family member or support person with them as long as that person is not COVID-19 positive or experiencing its symptoms.  That person may remain in the waiting area during the procedure and may rotate out with other people.  Inpatient Visitation:    Visiting hours are 7 a.m. to 8 p.m. Up to two visitors ages 16+ are allowed at one time in a patient room. The visitors may rotate out with other people during the day. Visitors must check out when they leave, or other visitors will not be allowed. One designated support person may remain overnight. The visitor must pass COVID-19 screenings, use hand sanitizer when entering and exiting the patients room and wear a mask at all times, including in the patients room. Patients must also wear a mask when staff or their visitor are in the room. Masking is required regardless of vaccination status.

## 2021-11-21 NOTE — Anesthesia Postprocedure Evaluation (Signed)
Anesthesia Post Note  Patient: Russell Gomez  Procedure(s) Performed: COLONOSCOPY WITH PROPOFOL  Patient location during evaluation: Endoscopy Anesthesia Type: General Level of consciousness: awake and alert Pain management: pain level controlled Vital Signs Assessment: post-procedure vital signs reviewed and stable Respiratory status: spontaneous breathing, nonlabored ventilation, respiratory function stable and patient connected to nasal cannula oxygen Cardiovascular status: blood pressure returned to baseline and stable Postop Assessment: no apparent nausea or vomiting Anesthetic complications: no   No notable events documented.   Last Vitals:  Vitals:   11/19/21 1425 11/19/21 1435  BP: 125/71 132/87  Pulse:    Resp: 17   Temp: 36.8 C   SpO2:      Last Pain:  Vitals:   11/19/21 1435  TempSrc:   PainSc: 0-No pain                 Martha Clan

## 2021-11-23 ENCOUNTER — Encounter: Payer: Self-pay | Admitting: Family Medicine

## 2021-11-23 MED ORDER — METOPROLOL TARTRATE 50 MG PO TABS: 50.0000 mg | ORAL_TABLET | Freq: Two times a day (BID) | ORAL | 3 refills | Status: DC

## 2021-11-23 NOTE — Telephone Encounter (Signed)
No answer/Voicemail box is not set up.   Verbally reviewed with provider that patient is in fact taking metoprolol 100 mg ONCE daily. Updated recommendations are for patient to take metoprolol 50 mg twice a day.

## 2021-11-23 NOTE — Telephone Encounter (Signed)
Given he was on Lopressor 100 mg bid at his office visit (unknown to [place] at that time, despite CMA and provider medication review with him), he does not need to start Toprol. Continue Lopressor 100 mg bid. Bring pill bottles/ medications that he is taking to next office visit.

## 2021-11-23 NOTE — Telephone Encounter (Signed)
Patient reports that he has been taking metoprolol tartrate 100 mg once daily and was doing so at visit back on 11/11/21. So he wants to know which one he should be taking. Advised I would update provider and call back with his recommendations.

## 2021-11-23 NOTE — Telephone Encounter (Signed)
Spoke with patient and reviewed that orders are to now take Metoprolol tartrate 50 mg twice a day. Confirmed pharmacy and sent that prescription over to them. He verbalized understanding with no further questions at this time. He verbalized understanding of our conversation, agreement with plan, and had no further questions at this time.

## 2021-11-25 ENCOUNTER — Ambulatory Visit: Payer: Medicaid Other | Admitting: Family Medicine

## 2021-11-26 ENCOUNTER — Inpatient Hospital Stay: Admission: RE | Admit: 2021-11-26 | Payer: Medicaid Other | Source: Ambulatory Visit

## 2021-11-27 ENCOUNTER — Other Ambulatory Visit: Payer: Self-pay

## 2021-11-27 ENCOUNTER — Other Ambulatory Visit
Admission: RE | Admit: 2021-11-27 | Discharge: 2021-11-27 | Disposition: A | Discharge status: Home / Self Care | Payer: BC Managed Care – PPO | Source: Ambulatory Visit | Attending: General Surgery | Admitting: General Surgery

## 2021-11-27 DIAGNOSIS — Z888 Allergy status to other drugs, medicaments and biological substances status: Secondary | ICD-10-CM | POA: Diagnosis not present

## 2021-11-27 DIAGNOSIS — Z833 Family history of diabetes mellitus: Secondary | ICD-10-CM | POA: Diagnosis not present

## 2021-11-27 DIAGNOSIS — I10 Essential (primary) hypertension: Secondary | ICD-10-CM | POA: Diagnosis not present

## 2021-11-27 DIAGNOSIS — K432 Incisional hernia without obstruction or gangrene: Secondary | ICD-10-CM | POA: Diagnosis not present

## 2021-11-27 DIAGNOSIS — Z433 Encounter for attention to colostomy: Secondary | ICD-10-CM | POA: Diagnosis not present

## 2021-11-27 DIAGNOSIS — Z794 Long term (current) use of insulin: Secondary | ICD-10-CM | POA: Diagnosis not present

## 2021-11-27 DIAGNOSIS — E1069 Type 1 diabetes mellitus with other specified complication: Secondary | ICD-10-CM | POA: Diagnosis not present

## 2021-11-27 DIAGNOSIS — Z01812 Encounter for preprocedural laboratory examination: Secondary | ICD-10-CM | POA: Insufficient documentation

## 2021-11-27 DIAGNOSIS — K660 Peritoneal adhesions (postprocedural) (postinfection): Secondary | ICD-10-CM | POA: Diagnosis not present

## 2021-11-27 DIAGNOSIS — Z8249 Family history of ischemic heart disease and other diseases of the circulatory system: Secondary | ICD-10-CM | POA: Diagnosis not present

## 2021-11-27 DIAGNOSIS — K219 Gastro-esophageal reflux disease without esophagitis: Secondary | ICD-10-CM | POA: Diagnosis not present

## 2021-11-27 DIAGNOSIS — Z20822 Contact with and (suspected) exposure to covid-19: Secondary | ICD-10-CM

## 2021-11-27 DIAGNOSIS — I480 Paroxysmal atrial fibrillation: Secondary | ICD-10-CM | POA: Diagnosis not present

## 2021-11-27 DIAGNOSIS — E1043 Type 1 diabetes mellitus with diabetic autonomic (poly)neuropathy: Secondary | ICD-10-CM | POA: Diagnosis not present

## 2021-11-27 DIAGNOSIS — Z7901 Long term (current) use of anticoagulants: Secondary | ICD-10-CM | POA: Diagnosis not present

## 2021-11-27 DIAGNOSIS — Z933 Colostomy status: Secondary | ICD-10-CM | POA: Diagnosis not present

## 2021-11-27 DIAGNOSIS — Z79899 Other long term (current) drug therapy: Secondary | ICD-10-CM | POA: Diagnosis not present

## 2021-11-27 DIAGNOSIS — E1065 Type 1 diabetes mellitus with hyperglycemia: Secondary | ICD-10-CM | POA: Diagnosis not present

## 2021-11-27 DIAGNOSIS — Z885 Allergy status to narcotic agent status: Secondary | ICD-10-CM | POA: Diagnosis not present

## 2021-11-27 LAB — SARS CORONAVIRUS 2 (TAT 6-24 HRS): SARS Coronavirus 2: NEGATIVE

## 2021-11-30 ENCOUNTER — Encounter: Payer: Self-pay | Admitting: General Surgery

## 2021-11-30 ENCOUNTER — Inpatient Hospital Stay: Payer: BC Managed Care – PPO | Admitting: Anesthesiology

## 2021-11-30 ENCOUNTER — Encounter
Admission: RE | Disposition: A | Discharge status: Home / Self Care | Payer: Self-pay | Source: Home / Self Care | Attending: General Surgery

## 2021-11-30 ENCOUNTER — Inpatient Hospital Stay
Admission: RE | Admit: 2021-11-30 | Discharge: 2021-12-02 | DRG: 331 | Disposition: A | Discharge status: Home / Self Care | Payer: BC Managed Care – PPO | Attending: General Surgery | Admitting: General Surgery

## 2021-11-30 ENCOUNTER — Other Ambulatory Visit: Payer: Self-pay

## 2021-11-30 DIAGNOSIS — Z888 Allergy status to other drugs, medicaments and biological substances status: Secondary | ICD-10-CM

## 2021-11-30 DIAGNOSIS — Z833 Family history of diabetes mellitus: Secondary | ICD-10-CM | POA: Diagnosis not present

## 2021-11-30 DIAGNOSIS — E1165 Type 2 diabetes mellitus with hyperglycemia: Secondary | ICD-10-CM | POA: Diagnosis present

## 2021-11-30 DIAGNOSIS — K219 Gastro-esophageal reflux disease without esophagitis: Secondary | ICD-10-CM | POA: Diagnosis present

## 2021-11-30 DIAGNOSIS — Z933 Colostomy status: Secondary | ICD-10-CM

## 2021-11-30 DIAGNOSIS — Z885 Allergy status to narcotic agent status: Secondary | ICD-10-CM

## 2021-11-30 DIAGNOSIS — K432 Incisional hernia without obstruction or gangrene: Secondary | ICD-10-CM | POA: Diagnosis present

## 2021-11-30 DIAGNOSIS — I480 Paroxysmal atrial fibrillation: Secondary | ICD-10-CM | POA: Diagnosis present

## 2021-11-30 DIAGNOSIS — I10 Essential (primary) hypertension: Secondary | ICD-10-CM | POA: Diagnosis present

## 2021-11-30 DIAGNOSIS — Z794 Long term (current) use of insulin: Secondary | ICD-10-CM

## 2021-11-30 DIAGNOSIS — E1043 Type 1 diabetes mellitus with diabetic autonomic (poly)neuropathy: Secondary | ICD-10-CM | POA: Diagnosis present

## 2021-11-30 DIAGNOSIS — Z433 Encounter for attention to colostomy: Secondary | ICD-10-CM | POA: Diagnosis present

## 2021-11-30 DIAGNOSIS — E1069 Type 1 diabetes mellitus with other specified complication: Secondary | ICD-10-CM | POA: Diagnosis present

## 2021-11-30 DIAGNOSIS — E1065 Type 1 diabetes mellitus with hyperglycemia: Secondary | ICD-10-CM | POA: Diagnosis present

## 2021-11-30 DIAGNOSIS — Z79899 Other long term (current) drug therapy: Secondary | ICD-10-CM

## 2021-11-30 DIAGNOSIS — Z8249 Family history of ischemic heart disease and other diseases of the circulatory system: Secondary | ICD-10-CM | POA: Diagnosis not present

## 2021-11-30 DIAGNOSIS — Z7901 Long term (current) use of anticoagulants: Secondary | ICD-10-CM

## 2021-11-30 DIAGNOSIS — Z20822 Contact with and (suspected) exposure to covid-19: Secondary | ICD-10-CM | POA: Diagnosis present

## 2021-11-30 HISTORY — PX: INCISIONAL HERNIA REPAIR: SHX193

## 2021-11-30 HISTORY — PX: XI ROBOTIC ASSISTED COLOSTOMY TAKEDOWN: SHX6828

## 2021-11-30 LAB — POCT I-STAT, CHEM 8
BUN: 16 mg/dL (ref 6–20)
Calcium, Ion: 1.12 mmol/L — ABNORMAL LOW (ref 1.15–1.40)
Chloride: 99 mmol/L (ref 98–111)
Creatinine, Ser: 0.7 mg/dL (ref 0.61–1.24)
Glucose, Bld: 183 mg/dL — ABNORMAL HIGH (ref 70–99)
HCT: 43 % (ref 39.0–52.0)
Hemoglobin: 14.6 g/dL (ref 13.0–17.0)
Potassium: 4.1 mmol/L (ref 3.5–5.1)
Sodium: 137 mmol/L (ref 135–145)
TCO2: 28 mmol/L (ref 22–32)

## 2021-11-30 LAB — GLUCOSE, CAPILLARY
Glucose-Capillary: 132 mg/dL — ABNORMAL HIGH (ref 70–99)
Glucose-Capillary: 141 mg/dL — ABNORMAL HIGH (ref 70–99)
Glucose-Capillary: 335 mg/dL — ABNORMAL HIGH (ref 70–99)
Glucose-Capillary: 350 mg/dL — ABNORMAL HIGH (ref 70–99)
Glucose-Capillary: 381 mg/dL — ABNORMAL HIGH (ref 70–99)
Glucose-Capillary: 63 mg/dL — ABNORMAL LOW (ref 70–99)
Glucose-Capillary: 68 mg/dL — ABNORMAL LOW (ref 70–99)

## 2021-11-30 SURGERY — CLOSURE, COLOSTOMY, ROBOT-ASSISTED
Anesthesia: General | Site: Abdomen | Laterality: Right

## 2021-11-30 MED ORDER — ORAL CARE MOUTH RINSE: 15.0000 mL | Freq: Once | OROMUCOSAL | Status: AC

## 2021-11-30 MED ORDER — OXYCODONE HCL 5 MG/5ML PO SOLN: 5.0000 mg | Freq: Once | ORAL | Status: DC | PRN

## 2021-11-30 MED ORDER — SODIUM CHLORIDE (PF) 0.9 % IJ SOLN
INTRAMUSCULAR | Status: AC
  Filled 2021-11-30: qty 50

## 2021-11-30 MED ORDER — SUGAMMADEX SODIUM 200 MG/2ML IV SOLN
INTRAVENOUS | Status: DC | PRN
  Administered 2021-11-30: 200 mg via INTRAVENOUS

## 2021-11-30 MED ORDER — HYDROMORPHONE HCL 1 MG/ML IJ SOLN: 1.0000 mg | INTRAMUSCULAR | Status: DC | PRN

## 2021-11-30 MED ORDER — LIDOCAINE HCL (PF) 2 % IJ SOLN
INTRAMUSCULAR | Status: AC
  Filled 2021-11-30: qty 5

## 2021-11-30 MED ORDER — FENTANYL CITRATE (PF) 100 MCG/2ML IJ SOLN: 25.0000 ug | INTRAMUSCULAR | Status: DC | PRN

## 2021-11-30 MED ORDER — DEXTROSE 50 % IV SOLN
50.0000 mL | Freq: Once | INTRAVENOUS | Status: AC
  Administered 2021-11-30: 50 mL via INTRAVENOUS

## 2021-11-30 MED ORDER — SODIUM CHLORIDE 0.9 % IV SOLN: INTRAVENOUS | Status: DC

## 2021-11-30 MED ORDER — ONDANSETRON 4 MG PO TBDP: 4.0000 mg | ORAL_TABLET | Freq: Four times a day (QID) | ORAL | Status: DC | PRN

## 2021-11-30 MED ORDER — METOPROLOL TARTRATE 5 MG/5ML IV SOLN
INTRAVENOUS | Status: AC
  Filled 2021-11-30: qty 5

## 2021-11-30 MED ORDER — TORSEMIDE 20 MG PO TABS
20.0000 mg | ORAL_TABLET | Freq: Every day | ORAL | Status: DC
  Administered 2021-12-01 – 2021-12-02 (×2): 20 mg via ORAL
  Filled 2021-11-30 (×2): qty 1

## 2021-11-30 MED ORDER — ALVIMOPAN 12 MG PO CAPS: 12.0000 mg | ORAL_CAPSULE | ORAL | Status: AC

## 2021-11-30 MED ORDER — INSULIN ASPART 100 UNIT/ML IJ SOLN
INTRAMUSCULAR | Status: AC
  Filled 2021-11-30: qty 1

## 2021-11-30 MED ORDER — METOCLOPRAMIDE HCL 5 MG/ML IJ SOLN
10.0000 mg | Freq: Once | INTRAMUSCULAR | Status: AC
  Administered 2021-11-30: 10 mg via INTRAVENOUS

## 2021-11-30 MED ORDER — CELECOXIB 200 MG PO CAPS
200.0000 mg | ORAL_CAPSULE | Freq: Two times a day (BID) | ORAL | Status: DC
  Administered 2021-11-30: 200 mg via ORAL
  Filled 2021-11-30 (×2): qty 1

## 2021-11-30 MED ORDER — GABAPENTIN 300 MG PO CAPS
ORAL_CAPSULE | ORAL | Status: AC
  Administered 2021-11-30: 300 mg via ORAL
  Filled 2021-11-30: qty 1

## 2021-11-30 MED ORDER — ALVIMOPAN 12 MG PO CAPS
12.0000 mg | ORAL_CAPSULE | Freq: Two times a day (BID) | ORAL | Status: DC
  Filled 2021-11-30: qty 1

## 2021-11-30 MED ORDER — SODIUM CHLORIDE (PF) 0.9 % IJ SOLN
INTRAMUSCULAR | Status: DC | PRN
  Administered 2021-11-30: 100 mL

## 2021-11-30 MED ORDER — MAGNESIUM OXIDE -MG SUPPLEMENT 400 (240 MG) MG PO TABS
400.0000 mg | ORAL_TABLET | Freq: Every day | ORAL | Status: DC
  Administered 2021-11-30 – 2021-12-02 (×3): 400 mg via ORAL
  Filled 2021-11-30 (×3): qty 1

## 2021-11-30 MED ORDER — METOPROLOL TARTRATE 5 MG/5ML IV SOLN
INTRAVENOUS | Status: DC | PRN
  Administered 2021-11-30 (×2): 1 mg via INTRAVENOUS
  Administered 2021-11-30: 2 mg via INTRAVENOUS

## 2021-11-30 MED ORDER — SODIUM CHLORIDE 0.9 % IR SOLN
Status: DC | PRN
  Administered 2021-11-30: 1000 mL

## 2021-11-30 MED ORDER — SODIUM CHLORIDE 0.9 % IV SOLN
2.0000 g | INTRAVENOUS | Status: AC
  Administered 2021-11-30: 2 g via INTRAVENOUS

## 2021-11-30 MED ORDER — HYDROMORPHONE HCL 1 MG/ML IJ SOLN
INTRAMUSCULAR | Status: DC | PRN
Start: 2021-11-30 — End: 2021-11-30
  Administered 2021-11-30 (×2): .5 mg via INTRAVENOUS

## 2021-11-30 MED ORDER — BUPIVACAINE LIPOSOME 1.3 % IJ SUSP: 20.0000 mL | Freq: Once | INTRAMUSCULAR | Status: DC

## 2021-11-30 MED ORDER — ONDANSETRON HCL 4 MG/2ML IJ SOLN
INTRAMUSCULAR | Status: AC
  Filled 2021-11-30: qty 2

## 2021-11-30 MED ORDER — PROPOFOL 10 MG/ML IV BOLUS
INTRAVENOUS | Status: DC | PRN
  Administered 2021-11-30: 150 mg via INTRAVENOUS

## 2021-11-30 MED ORDER — ONDANSETRON HCL 4 MG/2ML IJ SOLN: 4.0000 mg | Freq: Four times a day (QID) | INTRAMUSCULAR | Status: DC | PRN

## 2021-11-30 MED ORDER — METOPROLOL TARTRATE 50 MG PO TABS
50.0000 mg | ORAL_TABLET | Freq: Two times a day (BID) | ORAL | Status: DC
  Administered 2021-11-30 – 2021-12-02 (×4): 50 mg via ORAL
  Filled 2021-11-30 (×4): qty 1

## 2021-11-30 MED ORDER — FUROSEMIDE 20 MG PO TABS
20.0000 mg | ORAL_TABLET | Freq: Two times a day (BID) | ORAL | Status: DC
  Administered 2021-12-01: 09:00:00 20 mg via ORAL
  Filled 2021-11-30: qty 1

## 2021-11-30 MED ORDER — DEXTROSE 50 % IV SOLN: 50.0000 mL | Freq: Once | INTRAVENOUS | Status: DC

## 2021-11-30 MED ORDER — FENTANYL CITRATE (PF) 100 MCG/2ML IJ SOLN
INTRAMUSCULAR | Status: AC
  Filled 2021-11-30: qty 2

## 2021-11-30 MED ORDER — SODIUM CHLORIDE 0.9 % IV SOLN
2.0000 g | Freq: Two times a day (BID) | INTRAVENOUS | Status: AC
  Administered 2021-11-30: 2 g via INTRAVENOUS
  Filled 2021-11-30: qty 2

## 2021-11-30 MED ORDER — PROMETHAZINE HCL 25 MG/ML IJ SOLN
12.5000 mg | Freq: Once | INTRAMUSCULAR | Status: AC
  Administered 2021-11-30: 12.5 mg via INTRAVENOUS

## 2021-11-30 MED ORDER — FAMOTIDINE 20 MG PO TABS
ORAL_TABLET | ORAL | Status: AC
  Administered 2021-11-30: 20 mg via ORAL
  Filled 2021-11-30: qty 1

## 2021-11-30 MED ORDER — ALVIMOPAN 12 MG PO CAPS
ORAL_CAPSULE | ORAL | Status: AC
  Administered 2021-11-30: 12 mg via ORAL
  Filled 2021-11-30: qty 1

## 2021-11-30 MED ORDER — GABAPENTIN 300 MG PO CAPS: 300.0000 mg | ORAL_CAPSULE | ORAL | Status: AC

## 2021-11-30 MED ORDER — INSULIN ASPART 100 UNIT/ML IJ SOLN
15.0000 [IU] | Freq: Once | INTRAMUSCULAR | Status: AC
  Administered 2021-11-30: 15 [IU] via SUBCUTANEOUS

## 2021-11-30 MED ORDER — INDOCYANINE GREEN 25 MG IV SOLR
INTRAVENOUS | Status: DC | PRN
  Administered 2021-11-30: 5 mg via INTRAVENOUS

## 2021-11-30 MED ORDER — GABAPENTIN 300 MG PO CAPS
300.0000 mg | ORAL_CAPSULE | Freq: Two times a day (BID) | ORAL | Status: DC
  Administered 2021-11-30 – 2021-12-02 (×4): 300 mg via ORAL
  Filled 2021-11-30 (×4): qty 1

## 2021-11-30 MED ORDER — INSULIN ASPART 100 UNIT/ML IJ SOLN
0.0000 [IU] | Freq: Three times a day (TID) | INTRAMUSCULAR | Status: DC
  Administered 2021-12-01: 18:00:00 3 [IU] via SUBCUTANEOUS
  Administered 2021-12-01 (×2): 11 [IU] via SUBCUTANEOUS
  Filled 2021-11-30 (×3): qty 1

## 2021-11-30 MED ORDER — KETOROLAC TROMETHAMINE 30 MG/ML IJ SOLN
INTRAMUSCULAR | Status: DC | PRN
  Administered 2021-11-30: 30 mg via INTRAVENOUS

## 2021-11-30 MED ORDER — AMIODARONE HCL 200 MG PO TABS
200.0000 mg | ORAL_TABLET | Freq: Every day | ORAL | Status: DC
  Administered 2021-11-30 – 2021-12-02 (×3): 200 mg via ORAL
  Filled 2021-11-30 (×3): qty 1

## 2021-11-30 MED ORDER — ONDANSETRON HCL 4 MG/2ML IJ SOLN
4.0000 mg | Freq: Once | INTRAMUSCULAR | Status: AC
  Administered 2021-11-30: 4 mg via INTRAVENOUS

## 2021-11-30 MED ORDER — ACETAMINOPHEN 500 MG PO TABS: 1000.0000 mg | ORAL_TABLET | ORAL | Status: AC

## 2021-11-30 MED ORDER — LIDOCAINE HCL (CARDIAC) PF 100 MG/5ML IV SOSY
PREFILLED_SYRINGE | INTRAVENOUS | Status: DC | PRN
  Administered 2021-11-30: 100 mg via INTRAVENOUS

## 2021-11-30 MED ORDER — HYDROMORPHONE HCL 1 MG/ML IJ SOLN
INTRAMUSCULAR | Status: AC
  Filled 2021-11-30: qty 1

## 2021-11-30 MED ORDER — BUPIVACAINE-EPINEPHRINE (PF) 0.25% -1:200000 IJ SOLN
INTRAMUSCULAR | Status: AC
  Filled 2021-11-30: qty 30

## 2021-11-30 MED ORDER — OXYCODONE HCL 5 MG PO TABS: 5.0000 mg | ORAL_TABLET | Freq: Once | ORAL | Status: DC | PRN

## 2021-11-30 MED ORDER — CHLORHEXIDINE GLUCONATE 0.12 % MT SOLN
OROMUCOSAL | Status: AC
  Filled 2021-11-30: qty 15

## 2021-11-30 MED ORDER — VISTASEAL 4 ML SINGLE DOSE KIT
PACK | CUTANEOUS | Status: DC | PRN
Start: 2021-11-30 — End: 2021-11-30
  Administered 2021-11-30: 4 mL via TOPICAL

## 2021-11-30 MED ORDER — FAMOTIDINE 20 MG PO TABS: 20.0000 mg | ORAL_TABLET | Freq: Once | ORAL | Status: AC

## 2021-11-30 MED ORDER — CELECOXIB 200 MG PO CAPS: 200.0000 mg | ORAL_CAPSULE | ORAL | Status: AC

## 2021-11-30 MED ORDER — HYDROCODONE-ACETAMINOPHEN 5-325 MG PO TABS
1.0000 | ORAL_TABLET | ORAL | Status: DC | PRN
  Administered 2021-12-01 (×2): 1 via ORAL
  Administered 2021-12-02: 2 via ORAL
  Filled 2021-11-30 (×2): qty 1
  Filled 2021-11-30: qty 2

## 2021-11-30 MED ORDER — FENTANYL CITRATE (PF) 100 MCG/2ML IJ SOLN
INTRAMUSCULAR | Status: DC | PRN
  Administered 2021-11-30 (×3): 50 ug via INTRAVENOUS

## 2021-11-30 MED ORDER — CELECOXIB 200 MG PO CAPS
ORAL_CAPSULE | ORAL | Status: AC
  Administered 2021-11-30: 200 mg via ORAL
  Filled 2021-11-30: qty 1

## 2021-11-30 MED ORDER — INDOCYANINE GREEN 25 MG IV SOLR
INTRAVENOUS | Status: AC
  Filled 2021-11-30: qty 10

## 2021-11-30 MED ORDER — LACTATED RINGERS IV SOLN: INTRAVENOUS | Status: DC | PRN

## 2021-11-30 MED ORDER — SODIUM CHLORIDE 0.9 % IR SOLN
Status: DC | PRN
  Administered 2021-11-30: 500 mL

## 2021-11-30 MED ORDER — ONDANSETRON HCL 4 MG/2ML IJ SOLN
INTRAMUSCULAR | Status: DC | PRN
  Administered 2021-11-30: 4 mg via INTRAVENOUS

## 2021-11-30 MED ORDER — ENOXAPARIN SODIUM 40 MG/0.4ML IJ SOSY: 40.0000 mg | PREFILLED_SYRINGE | INTRAMUSCULAR | Status: DC

## 2021-11-30 MED ORDER — BUPIVACAINE LIPOSOME 1.3 % IJ SUSP
INTRAMUSCULAR | Status: AC
  Filled 2021-11-30: qty 20

## 2021-11-30 MED ORDER — SODIUM CHLORIDE 0.9 % IV SOLN
INTRAVENOUS | Status: AC
  Filled 2021-11-30: qty 2

## 2021-11-30 MED ORDER — PHENYLEPHRINE HCL-NACL 20-0.9 MG/250ML-% IV SOLN
INTRAVENOUS | Status: DC | PRN
Start: 2021-11-30 — End: 2021-11-30
  Administered 2021-11-30: 20 ug/min via INTRAVENOUS

## 2021-11-30 MED ORDER — ROCURONIUM BROMIDE 100 MG/10ML IV SOLN
INTRAVENOUS | Status: DC | PRN
  Administered 2021-11-30: 30 mg via INTRAVENOUS
  Administered 2021-11-30: 50 mg via INTRAVENOUS
  Administered 2021-11-30 (×2): 20 mg via INTRAVENOUS

## 2021-11-30 MED ORDER — METOCLOPRAMIDE HCL 5 MG/ML IJ SOLN
INTRAMUSCULAR | Status: AC
  Filled 2021-11-30: qty 2

## 2021-11-30 MED ORDER — ROCURONIUM BROMIDE 10 MG/ML (PF) SYRINGE
PREFILLED_SYRINGE | INTRAVENOUS | Status: AC
  Filled 2021-11-30: qty 10

## 2021-11-30 MED ORDER — DEXTROSE 50 % IV SOLN
INTRAVENOUS | Status: AC
  Administered 2021-11-30: 50 mL
  Filled 2021-11-30: qty 50

## 2021-11-30 MED ORDER — SODIUM CHLORIDE 0.9 % IV SOLN: INTRAVENOUS | Status: DC | PRN

## 2021-11-30 MED ORDER — INSULIN ASPART 100 UNIT/ML IJ SOLN
10.0000 [IU] | Freq: Once | INTRAMUSCULAR | Status: AC
  Administered 2021-11-30: 10 [IU] via SUBCUTANEOUS

## 2021-11-30 MED ORDER — PROMETHAZINE HCL 25 MG/ML IJ SOLN
INTRAMUSCULAR | Status: AC
  Filled 2021-11-30: qty 1

## 2021-11-30 MED ORDER — PANTOPRAZOLE SODIUM 40 MG IV SOLR
40.0000 mg | Freq: Every day | INTRAVENOUS | Status: DC
  Administered 2021-11-30 – 2021-12-01 (×2): 40 mg via INTRAVENOUS
  Filled 2021-11-30 (×2): qty 40

## 2021-11-30 MED ORDER — CHLORHEXIDINE GLUCONATE 0.12 % MT SOLN
15.0000 mL | Freq: Once | OROMUCOSAL | Status: AC
  Administered 2021-11-30: 15 mL via OROMUCOSAL

## 2021-11-30 MED ORDER — ACETAMINOPHEN 500 MG PO TABS
ORAL_TABLET | ORAL | Status: AC
  Administered 2021-11-30: 1000 mg via ORAL
  Filled 2021-11-30: qty 2

## 2021-11-30 MED ORDER — DEXMEDETOMIDINE HCL IN NACL 200 MCG/50ML IV SOLN
INTRAVENOUS | Status: DC | PRN
  Administered 2021-11-30: 12 ug via INTRAVENOUS

## 2021-11-30 SURGICAL SUPPLY — 89 items
APPLICATOR VISTASEAL FLEXIBLE (MISCELLANEOUS) ×2 IMPLANT
BLADE CLIPPER SURG (BLADE) ×2 IMPLANT
BLADE SURG SZ10 CARB STEEL (BLADE) ×5 IMPLANT
BLADE SURG SZ11 CARB STEEL (BLADE) ×5 IMPLANT
CANNULA REDUC XI 12-8 STAPL (CANNULA) ×1
CANNULA REDUC XI 12-8MM STAPL (CANNULA) ×1
CANNULA REDUCER 12-8 DVNC XI (CANNULA) ×3 IMPLANT
COVER TIP SHEARS 8 DVNC (MISCELLANEOUS) ×3 IMPLANT
COVER TIP SHEARS 8MM DA VINCI (MISCELLANEOUS) ×2
DRAPE ARM DVNC X/XI (DISPOSABLE) ×12 IMPLANT
DRAPE COLUMN DVNC XI (DISPOSABLE) ×3 IMPLANT
DRAPE DA VINCI XI ARM (DISPOSABLE) ×8
DRAPE DA VINCI XI COLUMN (DISPOSABLE) ×2
DRAPE LEGGINS SURG 28X43 STRL (DRAPES) ×5 IMPLANT
DRAPE UNDER BUTTOCK W/FLU (DRAPES) ×5 IMPLANT
DRSG OPSITE POSTOP 3X4 (GAUZE/BANDAGES/DRESSINGS) ×10 IMPLANT
ELECT CAUTERY BLADE 6.4 (BLADE) ×5 IMPLANT
ELECT REM PT RETURN 9FT ADLT (ELECTROSURGICAL) ×5
ELECTRODE REM PT RTRN 9FT ADLT (ELECTROSURGICAL) ×3 IMPLANT
GAUZE PACKING IODOFORM 1X5 (PACKING) ×2 IMPLANT
GLOVE SURG ENC MOIS LTX SZ6.5 (GLOVE) ×17 IMPLANT
GLOVE SURG UNDER POLY LF SZ6.5 (GLOVE) ×17 IMPLANT
GOWN STRL REUS W/ TWL LRG LVL3 (GOWN DISPOSABLE) ×18 IMPLANT
GOWN STRL REUS W/TWL LRG LVL3 (GOWN DISPOSABLE) ×14
GRASPER SUT TROCAR 14GX15 (MISCELLANEOUS) ×5 IMPLANT
HANDLE YANKAUER SUCT BULB TIP (MISCELLANEOUS) ×5 IMPLANT
IRRIGATION STRYKERFLOW (MISCELLANEOUS) IMPLANT
IRRIGATOR STRYKERFLOW (MISCELLANEOUS) ×5
IV NS 1000ML (IV SOLUTION) ×2
IV NS 1000ML BAXH (IV SOLUTION) IMPLANT
KIT IMAGING PINPOINTPAQ (MISCELLANEOUS) ×2 IMPLANT
KIT PINK PAD W/HEAD ARE REST (MISCELLANEOUS) ×5
KIT PINK PAD W/HEAD ARM REST (MISCELLANEOUS) ×3 IMPLANT
LABEL OR SOLS (LABEL) ×5 IMPLANT
MANIFOLD NEPTUNE II (INSTRUMENTS) ×5 IMPLANT
NDL INSUFFLATION 14GA 120MM (NEEDLE) ×3 IMPLANT
NEEDLE HYPO 22GX1.5 SAFETY (NEEDLE) ×5 IMPLANT
NEEDLE INSUFFLATION 14GA 120MM (NEEDLE) ×5 IMPLANT
OBTURATOR OPTICAL STANDARD 8MM (TROCAR) ×2
OBTURATOR OPTICAL STND 8 DVNC (TROCAR) ×3
OBTURATOR OPTICALSTD 8 DVNC (TROCAR) ×3 IMPLANT
PACK COLON CLEAN CLOSURE (MISCELLANEOUS) ×5 IMPLANT
PACK LAP CHOLECYSTECTOMY (MISCELLANEOUS) ×5 IMPLANT
PENCIL ELECTRO HAND CTR (MISCELLANEOUS) ×5 IMPLANT
PORT ACCESS TROCAR AIRSEAL 5 (TROCAR) ×5 IMPLANT
RELOAD STAPLE 60 3.5 BLU DVNC (STAPLE) IMPLANT
RELOAD STAPLER 3.5X60 BLU DVNC (STAPLE) ×3 IMPLANT
RETRACTOR WOUND ALXS 18CM SML (MISCELLANEOUS) IMPLANT
RTRCTR WOUND ALEXIS O 18CM SML (MISCELLANEOUS) ×5
SEAL CANN UNIV 5-8 DVNC XI (MISCELLANEOUS) ×9 IMPLANT
SEAL XI 5MM-8MM UNIVERSAL (MISCELLANEOUS) ×6
SEALER VESSEL DA VINCI XI (MISCELLANEOUS) ×2
SEALER VESSEL EXT DVNC XI (MISCELLANEOUS) IMPLANT
SET TRI-LUMEN FLTR TB AIRSEAL (TUBING) ×5 IMPLANT
SLEEVE PROTECTION STRL DISP (MISCELLANEOUS) ×2 IMPLANT
SOL PREP PVP 2OZ (MISCELLANEOUS) ×5
SOLUTION ELECTROLUBE (MISCELLANEOUS) ×5 IMPLANT
SOLUTION PREP PVP 2OZ (MISCELLANEOUS) ×3 IMPLANT
SPONGE T-LAP 18X18 ~~LOC~~+RFID (SPONGE) ×5 IMPLANT
SPONGE T-LAP 4X18 ~~LOC~~+RFID (SPONGE) ×5 IMPLANT
STAPLER 45 DA VINCI SURE FORM (STAPLE)
STAPLER 45 SUREFORM DVNC (STAPLE) IMPLANT
STAPLER 60 DA VINCI SURE FORM (STAPLE) ×2
STAPLER 60 SUREFORM DVNC (STAPLE) IMPLANT
STAPLER CANNULA SEAL DVNC XI (STAPLE) ×3 IMPLANT
STAPLER CANNULA SEAL XI (STAPLE) ×2
STAPLER CIRCULAR MANUAL XL 29 (STAPLE) ×2 IMPLANT
STAPLER RELOAD 3.5X60 BLU DVNC (STAPLE) ×3
STAPLER RELOAD 3.5X60 BLUE (STAPLE) ×2
STAPLER RELOADABLE 65 2-0 SUT (MISCELLANEOUS) IMPLANT
STAPLER SYS INTERNAL RELOAD SS (MISCELLANEOUS) ×5 IMPLANT
SURGILUBE 2OZ TUBE FLIPTOP (MISCELLANEOUS) ×5 IMPLANT
SUT MNCRL 4-0 (SUTURE) ×4
SUT MNCRL 4-0 27XMFL (SUTURE) ×6
SUT PDS PLUS 0 (SUTURE) ×4
SUT PDS PLUS AB 0 CT-2 (SUTURE) ×6 IMPLANT
SUT SILK 0 SH 30 (SUTURE) ×10 IMPLANT
SUT SILK 2 0 (SUTURE) ×2
SUT SILK 2-0 18XBRD TIE 12 (SUTURE) IMPLANT
SUT STRATAFIX PDS 30 CT-1 (SUTURE) ×2 IMPLANT
SUT V-LOC 90 ABS 3-0 VLT  V-20 (SUTURE) ×4
SUT V-LOC 90 ABS 3-0 VLT V-20 (SUTURE) IMPLANT
SUT VIC AB 3-0 SH 27 (SUTURE) ×10
SUT VIC AB 3-0 SH 27X BRD (SUTURE) ×12 IMPLANT
SUT VICRYL 0 AB UR-6 (SUTURE) ×10 IMPLANT
SUTURE MNCRL 4-0 27XMF (SUTURE) ×6 IMPLANT
SYR 30ML LL (SYRINGE) ×5 IMPLANT
TAPE PAPER 2X10 WHT MICROPORE (GAUZE/BANDAGES/DRESSINGS) ×2 IMPLANT
TRAY FOLEY MTR SLVR 16FR STAT (SET/KITS/TRAYS/PACK) ×5 IMPLANT

## 2021-11-30 NOTE — Op Note (Signed)
Preoperative diagnosis: Colostomy status  Postoperative diagnosis: Colostomy status                                             Incisional hernia  Procedure: Robotic assisted laparoscopic colostomy reversal.                     Incisional hernia repair   Anesthesia: GETA   Surgeon: Herbert Pun, MD  Assistant: Dr. Lysle Pearl    Wound Classification: Clean contaminated   Specimen: 1. Colostomy                     2.Sigmoid colon                    3. Hernia sac   Complications: None   Estimated Blood Loss: 100 mL   Indications: Patient is a 61 y.o. male with history of perforated diverticulitis treated with partial colectomy and end colostomy creation. Elective reversal of the colostomy was indicated.     FIndings: Significant inflammation of the sigmoid colon. The sigmoid colon was resected down at the rectosigmoid junction End to end tension-free anastomosis with EEA 29 A 6 cm incisional hernia was identified on the parastomal area.  Adequate hemostasis   Description of procedure: The patient was placed on the operating table in the lithotomy position, both arms tucked. General anesthesia was induced. A time-out was completed verifying correct patient, procedure, site, positioning, and implant(s) and/or special equipment prior to beginning this procedure. The abdomen was prepped and draped in the usual sterile fashion.    A Veress needle was inserted on Palmer's point.  Abdominal cavity was insufflated to 15 mmHg. Patient tolerated insufflation well.  An 8 mm port was inserted in an Optiview fashion in the right upper quadrant.  Two additional 8 mm ports and one 12 mm port were inserted under direct visualization along the right side of the abdominal wall. 5 mm assistant port was then placed on the right subcostal area.  No injuries from trocar placements were noted. The table was placed in the Trendelenburg position.  Xi robotic platform was then brought to the operative  field and docked at an angle from the left lower quadrant.  Tip up grasper and fenestrated bipolar were placed in the left arm ports.  Scissors were placed in right arm port.   Upon entering the abdomen there was abundant amount of adhesions. Time consuming adhesiolysis was performed with scissors. The left colon was mobilized from a lateral to medial approach by incising the lateral attachments. The sigmoid colon that was left with a diverticulum was mobilized.  Dissection was taken down to the rectosigmoid junction.  The sigmoid colon was divided with stapler.  Specimen removed and sent to pathology. Partial colectomy was done to ensure anastomosis at the rectum to decrease risk of recurrent episodes of diverticulitis.    DaVinci was undocked and the colostomy takedown was performed. Electrocautery was used to disconnect the mucocutaneous junction and dissection was carried in the subcutaneous plane until the fascia was reached and the bowel is dissected circumferentially and freed from the abdominal wall. Limited adhesiolysis was performed through the stomal wound and a short segment of the descending colon was mobilized. It was visually inspected and the colostomy portion of the bowel was trimmed. A pursestring device was used and the anvil  of a (29 mm range) circular EEA stapler was placed and the purse-string was tied. The descending colon was reduced inside the abdominal cavity. The abdominal cavity was again insufflated. DaVinci was again docked. ICG green was administered intravenously for assessment of colonic flap adequate perfusion. The descending colon and rectal stump was seen with adequate ICG green perfusion.   A rectal dilator was introduced transanally and A circular EEA stapler was introduced and a stapled end-to-side (Baker's anastomosis) anastomosis was performed under direct visualization. The anastomosis was tested by insufflating the rectum transanally after submerging the pelvis with  saline solution and gently occluding the proximal descending colon. No air leak was noted. The pelvis was irrigated and suction.  Under direct visualization, all trocars were removed.    Clean closure was done.  The hernia sac was completely dissected out of the anterior fascia.  Once healthy anterior fascia was identified the incisional hernia was closed in 2 layers.  The peritoneal layer was closed with PDS.  The anterior fascia was closed with STRATAFIX 0.  Betadine packing was left on the subcutaneous tissue of the colostomy incision.  Skin was partially closed with staples.  The skin of the trocar incisions were closed with staples.  All wounds were dressed. All instruments, needles, and sponges count was correct times two. The patient tolerated the procedure well and was taken to the recovery unit in stable condition.   Herbert Pun, MD, FACS

## 2021-11-30 NOTE — Progress Notes (Signed)
I updated Wing Schoch, wife of patient, to update her on her husband

## 2021-11-30 NOTE — Anesthesia Procedure Notes (Signed)
Procedure Name: Intubation Date/Time: 11/30/2021 10:07 AM Performed by: Beverely Low, CRNA Pre-anesthesia Checklist: Patient identified, Patient being monitored, Timeout performed, Emergency Drugs available and Suction available Patient Re-evaluated:Patient Re-evaluated prior to induction Oxygen Delivery Method: Circle system utilized Preoxygenation: Pre-oxygenation with 100% oxygen Induction Type: IV induction Ventilation: Mask ventilation without difficulty Laryngoscope Size: Mac, McGraph and 4 Grade View: Grade I Tube type: Oral Tube size: 7.5 mm Number of attempts: 1 Placement Confirmation: ETT inserted through vocal cords under direct vision, positive ETCO2 and breath sounds checked- equal and bilateral Secured at: 21 cm Tube secured with: Tape Dental Injury: Teeth and Oropharynx as per pre-operative assessment

## 2021-11-30 NOTE — Progress Notes (Addendum)
Notified Dr. Rosey Bath that patient is remaining tachycardic in the PACU, up to 115 BPM. Normal sinus tachycardia. Patient lung sounds are clear upon auscultation. No new orders are given, Dr. Rosey Bath says that patient is okay. Notified patient wife that nausea has resolved, and that patient is still waiting for a room assignment.

## 2021-11-30 NOTE — Progress Notes (Signed)
Patient has a bed assignment, denies pain and nausea. Patient is alert, oriented x4. Notified patient's wife Elizeo Rodriques that patient is going to room 213 on the 2nd floor.

## 2021-11-30 NOTE — Progress Notes (Signed)
Notified patient wife Lysander Calixte that patient is doing well in recovery. Has been nauseated intermittently but we have been treating it. Notified her that it may be a couple of hours until the patient receives an available room per bed placement.

## 2021-11-30 NOTE — Transfer of Care (Signed)
Immediate Anesthesia Transfer of Care Note  Patient: Russell Gomez  Procedure(s) Performed: XI ROBOTIC ASSISTED COLOSTOMY TAKEDOWN (Abdomen) HERNIA REPAIR INCISIONAL (Right: Abdomen)  Patient Location: PACU  Anesthesia Type:General  Level of Consciousness: sedated  Airway & Oxygen Therapy: Patient Spontanous Breathing  Post-op Assessment: Post -op Vital signs reviewed and stable  Post vital signs: Reviewed and stable  Last Vitals:  Vitals Value Taken Time  BP    Temp    Pulse 103 11/30/21 1442  Resp 15 11/30/21 1442  SpO2 96 % 11/30/21 1442  Vitals shown include unvalidated device data.  Last Pain:  Vitals:   11/30/21 0940  TempSrc: Temporal  PainSc: 0-No pain      Patients Stated Pain Goal: 0 (09/73/53 2992)  Complications: No notable events documented.

## 2021-11-30 NOTE — Progress Notes (Signed)
Inpatient Diabetes Program Recommendations  AACE/ADA: New Consensus Statement on Inpatient Glycemic Control   Target Ranges:  Prepandial:   less than 140 mg/dL      Peak postprandial:   less than 180 mg/dL (1-2 hours)      Critically ill patients:  140 - 180 mg/dL    Latest Reference Range & Units 11/30/21 08:49  Glucose-Capillary 70 - 99 mg/dL 141 (H)   Review of Glycemic Control  Diabetes history: DM1 Outpatient Diabetes medications: NPH 15 units BID, Regular 2-15 units TID with meals Current orders for Inpatient glycemic control: None; in OR at this time for colostomy reversal  Inpatient Diabetes Program Recommendations:    Insulin: If admitted, please consider ordering Semglee 10 units Q24H, CBGs Q4H, and Novolog 0-9 units Q4H.  Thanks, Barnie Alderman, RN, MSN, CDE Diabetes Coordinator Inpatient Diabetes Program [phone] (Team Pager from 8am to 5pm)
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
## 2021-11-30 NOTE — Progress Notes (Signed)
Patient is nauseous again and throws up. [place]er suction at bedside because patient is drowsy. Patient able to throw up in emesis bag. Suctioned patient also. Denies pain. Notified Dr. Rosey Bath of patient elevated blood glucose, and patient nausea. Also notified him that patient is tachycardic with pulse into the 113. Sinus tach. No intervention ordered for the tachycardia, Orded 10 units SQ novolog and 4 mg Iv zofran
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
## 2021-11-30 NOTE — OR Nursing (Signed)
Intra-Op sponge activity: 1 mini-lap placed in the abdomen at 1108, this sponge was taken out of the abdomen and scanned out at 1136, Bobby Rumpf, RN  1 mini-lap placed in the abdomen at 1137, 1 addition mini-lap sponge placed in abdomen at 1247, 2 mini-lap sponges were removed and scanned out at 47, Bobby Rumpf, RN  ICG kit used, reconstituted to 2.38m/ml, given to CRNA for IV administration for Dr CPeyton Najjarto check for tissue perfusion at anastamosis at 146 LBobby Rumpf RLa Liga

## 2021-12-01 ENCOUNTER — Encounter: Payer: Self-pay | Admitting: General Surgery

## 2021-12-01 DIAGNOSIS — Z933 Colostomy status: Secondary | ICD-10-CM

## 2021-12-01 DIAGNOSIS — I480 Paroxysmal atrial fibrillation: Secondary | ICD-10-CM

## 2021-12-01 DIAGNOSIS — E1065 Type 1 diabetes mellitus with hyperglycemia: Secondary | ICD-10-CM

## 2021-12-01 LAB — HEMOGLOBIN A1C
Hgb A1c MFr Bld: 8.5 % — ABNORMAL HIGH (ref 4.8–5.6)
Mean Plasma Glucose: 197 mg/dL

## 2021-12-01 LAB — BASIC METABOLIC PANEL
Anion gap: 9 (ref 5–15)
BUN: 17 mg/dL (ref 6–20)
CO2: 23 mmol/L (ref 22–32)
Calcium: 8.4 mg/dL — ABNORMAL LOW (ref 8.9–10.3)
Chloride: 106 mmol/L (ref 98–111)
Creatinine, Ser: 0.94 mg/dL (ref 0.61–1.24)
GFR, Estimated: >60 mL/min (ref >60)
Glucose, Bld: 202 mg/dL — ABNORMAL HIGH (ref 70–99)
Potassium: 3.9 mmol/L (ref 3.5–5.1)
Sodium: 138 mmol/L (ref 135–145)

## 2021-12-01 LAB — CBC
HCT: 36.9 % — ABNORMAL LOW (ref 39.0–52.0)
Hemoglobin: 12.3 g/dL — ABNORMAL LOW (ref 13.0–17.0)
MCH: 29.1 pg (ref 26.0–34.0)
MCHC: 33.3 g/dL (ref 30.0–36.0)
MCV: 87.4 fL (ref 80.0–100.0)
Platelets: 335 10*3/uL (ref 150–400)
RBC: 4.22 MIL/uL (ref 4.22–5.81)
RDW: 15.9 % — ABNORMAL HIGH (ref 11.5–15.5)
WBC: 13.3 10*3/uL — ABNORMAL HIGH (ref 4.0–10.5)
nRBC: 0 % (ref 0.0–0.2)

## 2021-12-01 LAB — GLUCOSE, CAPILLARY
Glucose-Capillary: 318 mg/dL — ABNORMAL HIGH (ref 70–99)
Glucose-Capillary: 306 mg/dL — ABNORMAL HIGH (ref 70–99)
Glucose-Capillary: 190 mg/dL — ABNORMAL HIGH (ref 70–99)
Glucose-Capillary: 226 mg/dL — ABNORMAL HIGH (ref 70–99)

## 2021-12-01 MED ORDER — ENOXAPARIN SODIUM 100 MG/ML IJ SOSY
1.0000 mg/kg | PREFILLED_SYRINGE | Freq: Two times a day (BID) | INTRAMUSCULAR | Status: DC
  Administered 2021-12-01 – 2021-12-02 (×3): 100 mg via SUBCUTANEOUS
  Filled 2021-12-01 (×4): qty 1

## 2021-12-01 MED ORDER — INSULIN DETEMIR 100 UNIT/ML ~~LOC~~ SOLN
10.0000 [IU] | Freq: Every day | SUBCUTANEOUS | Status: DC
  Administered 2021-12-01: 12:00:00 10 [IU] via SUBCUTANEOUS
  Filled 2021-12-01 (×2): qty 0.1

## 2021-12-01 MED ORDER — CHLORHEXIDINE GLUCONATE CLOTH 2 % EX PADS
6.0000 | MEDICATED_PAD | Freq: Every day | CUTANEOUS | Status: DC
  Administered 2021-12-01 – 2021-12-02 (×2): 6 via TOPICAL

## 2021-12-01 NOTE — Progress Notes (Addendum)
Inpatient Diabetes Program Recommendations  AACE/ADA: New Consensus Statement on Inpatient Glycemic Control   Target Ranges:  Prepandial:   less than 140 mg/dL      Peak postprandial:   less than 180 mg/dL (1-2 hours)      Critically ill patients:  140 - 180 mg/dL    Latest Reference Range & Units 12/01/21 04:39  Glucose 70 - 99 mg/dL 202 (H)     Latest Reference Range & Units 11/19/21 11:49 11/30/21 08:49 11/30/21 15:03 11/30/21 16:10 11/30/21 16:55 11/30/21 17:16 11/30/21 18:02 11/30/21 18:09 11/30/21 22:46 11/30/21 22:51 11/30/21 23:44  Glucose-Capillary 70 - 99 mg/dL 96 141 (H) 350 (H)   Novolog 10 units 381 (H)   Novolog 15 units 335 (H)   Novolog 15 units 68 (L) 63 (L) 132 (H)   Review of Glycemic Control  Diabetes history: DM1 Outpatient Diabetes medications: NPH 15 units BID, Regular 2-15 units TID with meals Current orders for Inpatient glycemic control: Novolog 0-15 units TID with meals   Inpatient Diabetes Program Recommendations:     Insulin: Patient has Type 1 diabetes and is sensitive to insulin. Patient received a total of Novolog 40 units (over a 4 hour period yesterday) and then had hypoglycemia (from Novolog).  Please consider ordering Semglee 10 units Q24H and decrease SSI to Novolog 0-9 units AC&HS.   Thanks, Barnie Alderman, RN, MSN, CDE Diabetes Coordinator Inpatient Diabetes Program [phone] (Team Pager from 8am to 5pm)

## 2021-12-01 NOTE — Progress Notes (Signed)
Patient ID: Russell Gomez, male   DOB: 08-Jul-1961, 61 y.o.   MRN: [id number]     [facility] Day(s): 1.   Interval History: Patient seen and examined, no acute events or new complaints overnight. Patient reports feeling well.  She does have a good bowel movement for now.  Denies any nausea or vomiting at this moment.  Vital signs in last 24 hours: [min-max] current  Temp:  [97.9 F (36.6 C)-99.7 F (37.6 C)] 99.7 F (37.6 C) (01/24 0319) Pulse Rate:  [86-114] 99 (01/24 0319) Resp:  [14-20] 20 (01/24 0319) BP: (119-159)/(54-75) 119/58 (01/24 0319) SpO2:  [95 %-100 %] 95 % (01/24 0319) Weight:  [100.2 kg] 100.2 kg (01/23 0940)     Height: 6\' 3"  (190.5 cm) Weight: 100.2 kg BMI (Calculated): 27.61   Physical Exam:  Constitutional: alert, cooperative and no distress  Respiratory: breathing non-labored at rest  Cardiovascular: regular rate and sinus rhythm  Gastrointestinal: soft, non-tender, and non-distended  Labs:  CBC Latest Ref Rng & Units 12/01/2021 11/30/2021 08/25/2021  WBC 4.0 - 10.5 K/uL 13.3(H) - 7.6  Hemoglobin 13.0 - 17.0 g/dL 12.3(L) 14.6 13.9  Hematocrit 39.0 - 52.0 % 36.9(L) 43.0 42.5  Platelets 150 - 400 K/uL 335 - 288.0   CMP Latest Ref Rng & Units 12/01/2021 11/30/2021 08/25/2021  Glucose 70 - 99 mg/dL 202(H) 183(H) 292(H)  BUN 6 - 20 mg/dL 17 16 21   Creatinine 0.61 - 1.24 mg/dL 0.94 0.70 0.88  Sodium 135 - 145 mmol/L 138 137 133(L)  Potassium 3.5 - 5.1 mmol/L 3.9 4.1 4.6  Chloride 98 - 111 mmol/L 106 99 100  CO2 22 - 32 mmol/L 23 - 28  Calcium 8.9 - 10.3 mg/dL 8.4(L) - 9.6  Total Protein 6.0 - 8.3 g/dL - - 6.7  Total Bilirubin 0.2 - 1.2 mg/dL - - 0.4  Alkaline Phos 39 - 117 U/L - - 82  AST 0 - 37 U/L - - 12  ALT 0 - 53 U/L - - 11    Imaging studies: No new pertinent imaging studies   Assessment/Plan:  61 y.o. male with colostomy status 1 Day Post-Op s/p colostomy reversal, complicated by pertinent comorbidities including diabetes,  hypertension, A. fib on chronic anticoagulation.  Colostomy status -Status post colostomy reversal on 11/30/2021 -Had some postanesthesia nausea and vomiting that resolved with medications -Today without nausea and having bowel movement -We will advance diet to full liquids  A. Fib -We will continue with home medications -We will restart therapeutic anticoagulation with Lovenox injections  Diabetes -Tolerating clear liquids and will advance to full liquids -We will continue with sliding scale since there was 2 episode of hypoglycemia yesterday.  I encouraged the patient to get out of bed and ambulate  Arnold Long, MD
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
## 2021-12-01 NOTE — Anesthesia Postprocedure Evaluation (Signed)
Anesthesia Post Note  Patient: KHALEEL BECKOM  Procedure(s) Performed: XI ROBOTIC ASSISTED COLOSTOMY TAKEDOWN (Abdomen) HERNIA REPAIR INCISIONAL (Right: Abdomen)  Patient location during evaluation: PACU Anesthesia Type: General Level of consciousness: awake and alert Pain management: pain level controlled Vital Signs Assessment: post-procedure vital signs reviewed and stable Respiratory status: spontaneous breathing, nonlabored ventilation, respiratory function stable and patient connected to nasal cannula oxygen Cardiovascular status: blood pressure returned to baseline and stable Postop Assessment: no apparent nausea or vomiting Anesthetic complications: no   No notable events documented.   Last Vitals:  Vitals:   12/01/21 0319 12/01/21 0748  BP: (!) 119/58 129/64  Pulse: 99 90  Resp: 20   Temp: 37.6 C 37.1 C  SpO2: 95% 98%    Last Pain:  Vitals:   12/01/21 0748  TempSrc: Oral  PainSc:                  [name] Annaliza Zia

## 2021-12-01 NOTE — TOC Initial Note (Signed)
Transition of Care [facility]) - Initial/Assessment Note    Patient Details  Name: Russell Gomez MRN: [id number] Date of Birth: 1961/05/20  Transition of Care [facility]) CM/SW Contact:    Beverly Sessions, RN Phone Number: 12/01/2021, 2:14 PM  Clinical Narrative:                   Transition of Care [facility]) Screening Note   Patient Details  Name: Russell Gomez Date of Birth: [birth year]   Transition of Care [facility]) CM/SW Contact:    Beverly Sessions, RN Phone Number: 12/01/2021, 2:14 PM    Transition of Care Department [facility]) has reviewed patient and no TOC needs have been identified at this time. We will continue to monitor patient advancement through interdisciplinary progression rounds. If new patient transition needs arise, please place a TOC consult.         Patient Goals and CMS Choice        Expected Discharge Plan and Services                                                Prior Living Arrangements/Services                       Activities of Daily Living Home Assistive Devices/Equipment: None ADL Screening (condition at time of admission) Patient's cognitive ability adequate to safely complete daily activities?: Yes Is the patient deaf or have difficulty hearing?: No Does the patient have difficulty seeing, even when wearing glasses/contacts?: No Does the patient have difficulty concentrating, remembering, or making decisions?: No Patient able to express need for assistance with ADLs?: Yes Does the patient have difficulty dressing or bathing?: No Independently performs ADLs?: Yes (appropriate for developmental age) Does the patient have difficulty walking or climbing stairs?: No Weakness of Legs: None Weakness of Arms/Hands: None  Permission Sought/Granted                  Emotional Assessment              Admission diagnosis:  Colostomy status ([place]) [Z93.3] Patient Active Problem List   Diagnosis Date Noted   Colostomy  status ([place]) 11/30/2021   Bowel perforation ([place]) 06/14/2021   Terminal ileitis ([place]) 06/14/2021   Malnutrition of moderate degree 05/28/2021   Lactic acid acidosis    AF (paroxysmal atrial fibrillation) ([place])    Diabetes mellitus with hyperglycemia ([place]) 05/27/2021   Type 1 diabetes mellitus with hyperglycemia ([place]) 05/27/2021   Hypophosphatemia    Atrial fibrillation with RVR ([place]) 12/27/2020   AAA (abdominal aortic aneurysm) 05/27/2020   Transaminitis 05/22/2020   Thiamine deficiency 04/28/2020   Alcohol use disorder, moderate, dependence ([place]) 04/25/2020   Macrocytic anemia 04/19/2020   Abnormal EKG 04/19/2020   Proteinuria 04/19/2020   Vitamin B12 deficiency 04/19/2020   Vitamin D deficiency 04/19/2020   Folate deficiency 04/19/2020   Hyperparathyroidism ([place]) 04/19/2020   Psychosocial stressors 04/27/2019   Pedal edema 04/08/2017   Abnormal thyroid function test 02/13/2017   Encounter for general adult medical examination with abnormal findings 01/23/2014   Cough 01/09/2014   Colloid thyroid nodule 05/05/2012   Erectile dysfunction 04/19/2012   Type 1 diabetes mellitus with complications ([place]) 99/37/1696   Hyperlipidemia due to type 1 diabetes mellitus ([place]) 10/22/2010   TOBACCO ABUSE 10/22/2010   Essential hypertension 10/22/2010  GERD 10/22/2010   PCP:  Ria Bush, MD Pharmacy:   [facility] [address], [place] - [place] [address] [place] [address] Phone: [phone] Fax: [place], [place] - [place] [address] Phone: [phone] Fax: [phone]     Social Determinants of Health (SDOH) Interventions    Readmission Risk Interventions No flowsheet data found.

## 2021-12-02 DIAGNOSIS — I10 Essential (primary) hypertension: Secondary | ICD-10-CM

## 2021-12-02 LAB — CBC
HCT: 37.1 % — ABNORMAL LOW (ref 39.0–52.0)
Hemoglobin: 12.2 g/dL — ABNORMAL LOW (ref 13.0–17.0)
MCH: 29.3 pg (ref 26.0–34.0)
MCHC: 32.9 g/dL (ref 30.0–36.0)
MCV: 89.2 fL (ref 80.0–100.0)
Platelets: 276 10*3/uL (ref 150–400)
RBC: 4.16 MIL/uL — ABNORMAL LOW (ref 4.22–5.81)
RDW: 16 % — ABNORMAL HIGH (ref 11.5–15.5)
WBC: 12.3 10*3/uL — ABNORMAL HIGH (ref 4.0–10.5)
nRBC: 0 % (ref 0.0–0.2)

## 2021-12-02 LAB — GLUCOSE, CAPILLARY
Glucose-Capillary: 360 mg/dL — ABNORMAL HIGH (ref 70–99)
Glucose-Capillary: 289 mg/dL — ABNORMAL HIGH (ref 70–99)

## 2021-12-02 LAB — BASIC METABOLIC PANEL
Anion gap: 10 (ref 5–15)
BUN: 20 mg/dL (ref 6–20)
CO2: 21 mmol/L — ABNORMAL LOW (ref 22–32)
Calcium: 8.2 mg/dL — ABNORMAL LOW (ref 8.9–10.3)
Chloride: 103 mmol/L (ref 98–111)
Creatinine, Ser: 0.85 mg/dL (ref 0.61–1.24)
GFR, Estimated: >60 mL/min (ref >60)
Glucose, Bld: 324 mg/dL — ABNORMAL HIGH (ref 70–99)
Potassium: 3.8 mmol/L (ref 3.5–5.1)
Sodium: 134 mmol/L — ABNORMAL LOW (ref 135–145)

## 2021-12-02 LAB — SURGICAL PATHOLOGY

## 2021-12-02 MED ORDER — HYDROCODONE-ACETAMINOPHEN 5-325 MG PO TABS: 1.0000 | ORAL_TABLET | ORAL | 0 refills | Status: AC | PRN

## 2021-12-02 MED ORDER — INSULIN DETEMIR 100 UNIT/ML ~~LOC~~ SOLN
15.0000 [IU] | Freq: Every day | SUBCUTANEOUS | Status: DC
  Administered 2021-12-02: 11:00:00 15 [IU] via SUBCUTANEOUS
  Filled 2021-12-02: qty 0.15

## 2021-12-02 MED ORDER — INSULIN ASPART 100 UNIT/ML IJ SOLN: 0.0000 [IU] | Freq: Every day | INTRAMUSCULAR | Status: DC

## 2021-12-02 MED ORDER — INSULIN ASPART 100 UNIT/ML IJ SOLN
0.0000 [IU] | Freq: Three times a day (TID) | INTRAMUSCULAR | Status: DC
  Administered 2021-12-02: 09:00:00 15 [IU] via SUBCUTANEOUS
  Administered 2021-12-02: 12:00:00 8 [IU] via SUBCUTANEOUS
  Filled 2021-12-02 (×2): qty 1

## 2021-12-02 MED ORDER — INSULIN ASPART 100 UNIT/ML IJ SOLN
3.0000 [IU] | Freq: Three times a day (TID) | INTRAMUSCULAR | Status: DC
  Administered 2021-12-02 (×2): 3 [IU] via SUBCUTANEOUS
  Filled 2021-12-02 (×2): qty 1

## 2021-12-02 NOTE — Discharge Instructions (Signed)

## 2021-12-02 NOTE — Progress Notes (Signed)
Discharge instructions reviewed with patient and wife at the bedside. PIV removed with no complications. Patient states he has no further questions or concerns at this time. [place] home with wife

## 2021-12-02 NOTE — Progress Notes (Signed)
Inpatient Diabetes Program Recommendations  AACE/ADA: New Consensus Statement on Inpatient Glycemic Control   Target Ranges:  Prepandial:   less than 140 mg/dL      Peak postprandial:   less than 180 mg/dL (1-2 hours)      Critically ill patients:  140 - 180 mg/dL    Latest Reference Range & Units 12/02/21 04:54  Glucose 70 - 99 mg/dL 324 (H)    Latest Reference Range & Units 12/01/21 07:57 12/01/21 11:39 12/01/21 16:44 12/01/21 21:40  Glucose-Capillary 70 - 99 mg/dL 318 (H) 306 (H) 190 (H) 226 (H)   Review of Glycemic Control  Diabetes history: DM1 Outpatient Diabetes medications: NPH 15 units BID, Regular 2-15 units TID with meals Current orders for Inpatient glycemic control: Levemir 10 units daily, Novolog 0-15 units TID with meals   Inpatient Diabetes Program Recommendations:    Insulin: Please consider increasing Levemir to 15 units daily, adding Novolog 0-5 units QHS, and adding Novolog 3 units TID with meals for meal coverage if patient eats at least 50% of meals.  Thanks, Barnie Alderman, RN, MSN, CDE Diabetes Coordinator Inpatient Diabetes Program [phone] (Team Pager from 8am to 5pm)'

## 2021-12-02 NOTE — Discharge Summary (Signed)
Patient ID: Russell Gomez MRN: [id number] DOB/AGE: April 29, 1961 61 y.o.  Admit date: 11/30/2021 Discharge date: 12/02/2021   Discharge Diagnoses:  Principal Problem:   Diabetes mellitus with hyperglycemia ([place]) Active Problems:   Essential hypertension   AF (paroxysmal atrial fibrillation) (HCC)   Colostomy status (HCC)   Procedures: Robotic-assisted laparoscopic colostomy reversal with partial colectomy  Hospital Course: Patient admitted for colostomy reversal.  He underwent procedure and tolerated well.  He has been recovering adequately.  Patient is ambulating.  Patient is having bowel movement.  Patient is tolerating soft diet.  Incision are healing adequately.  Physical Exam Constitutional:      Appearance: Normal appearance.  HENT:     Head: Normocephalic.  Cardiovascular:     Rate and Rhythm: Normal rate and regular rhythm.     Pulses: Normal pulses.  Pulmonary:     Effort: Pulmonary effort is normal.     Breath sounds: Normal breath sounds.  Abdominal:     General: Abdomen is flat.  Skin:    General: Skin is warm.     Capillary Refill: Capillary refill takes less than 2 seconds.  Neurological:     Mental Status: He is alert and oriented to person, place, and time.     Consults: Hospitalist  Disposition: Discharge disposition: 01-Home or Self Care       Discharge Instructions     Diet - low sodium heart healthy   Complete by: As directed    Increase activity slowly   Complete by: As directed       Allergies as of 12/02/2021       Reactions   Insulin Aspart Other (See Comments)   CELLULITIS   Spironolactone Nausea Only, Rash   Hydrochlorothiazide Itching, Rash   Morphine Other (See Comments)   "MAKES ME MEAN"        Medication List     TAKE these medications    acetaminophen 325 MG tablet Commonly known as: TYLENOL Take 650 mg by mouth every 6 (six) hours as needed.   amiodarone 200 MG tablet Commonly known as: PACERONE Take 200  mg by mouth daily.   apixaban 5 MG Tabs tablet Commonly known as: ELIQUIS Take 1 tablet (5 mg total) by mouth 2 (two) times daily.   B-12 PO Take 1 tablet by mouth daily.   HYDROcodone-acetaminophen 5-325 MG tablet Commonly known as: Norco Take 1 tablet by mouth every 4 (four) hours as needed for up to 3 days for moderate pain.   insulin NPH Human 100 UNIT/ML injection Commonly known as: NOVOLIN N Inject 15 Units into the skin 2 (two) times daily before a meal.   insulin regular 100 units/mL injection Commonly known as: NOVOLIN R Inject 2-15 Units into the skin 3 (three) times daily before meals.   magnesium oxide 400 (240 Mg) MG tablet Commonly known as: MAG-OX Take 1 tablet (400 mg total) by mouth daily.   metoprolol tartrate 50 MG tablet Commonly known as: LOPRESSOR Take 1 tablet (50 mg total) by mouth 2 (two) times daily. What changed: how much to take   pantoprazole 40 MG tablet Commonly known as: PROTONIX Take 1 tablet (40 mg total) by mouth at bedtime.   pravastatin 80 MG tablet Commonly known as: PRAVACHOL Take 1 tablet (80 mg total) by mouth daily.   torsemide 20 MG tablet Commonly known as: DEMADEX Take 1 tablet (20 mg total) by mouth daily.   VITAMIN D PO Take 5,000 Units by mouth daily.  Follow-up Information     Herbert Pun, MD Follow up in 2 week(s).   Specialty: General Surgery Why: Follow up after colostomy reversal Contact information: [place] [address] [phone]

## 2021-12-02 NOTE — Progress Notes (Signed)
PROGRESS NOTE    Russell Gomez  OEU:[id number] DOB: [birth year] DOA: 11/30/2021 PCP: Ria Bush, MD   Assessment & Plan:   Principal Problem:   Diabetes mellitus with hyperglycemia ([place]) Active Problems:   Essential hypertension   AF (paroxysmal atrial fibrillation) ([place])   Colostomy status ([place])   DM1: likely poorly controlled. Continue on levemir, aspart, & SSI w/ accuchecks    PAF: continue on metoprolol, amiodarone & lovenox   HTN: continue on metoprolol, amio   S/p reversal of colostomy: management as per general surg     DVT prophylaxis: lovenox  Code Status: full  Family Communication: discussed pt's care w/ pt's family at bedside and answered their questions  Disposition Plan: likely d/c back home   Level of care: Med-Surg  Status is: Inpatient  Remains inpatient appropriate because: severity of illness     Consultants:  Hospitalist   Procedures:   Antimicrobials:   Subjective: Pt denies any complaints   Objective: Vitals:   12/01/21 2039 12/01/21 2040 12/02/21 0421 12/02/21 0813  BP: 118/78 118/70 101/71 132/63  Pulse:  (!) 103 79 95  Resp:  18 18   Temp:  98.6 F (37 C) 98 F (36.7 C) 97.9 F (36.6 C)  TempSrc:      SpO2:  97% 97% 98%  Weight:      Height:        Intake/Output Summary (Last 24 hours) at 12/02/2021 0830 Last data filed at 12/02/2021 0026 Gross per 24 hour  Intake 2182.26 ml  Output --  Net 2182.26 ml   Filed Weights   11/30/21 0940  Weight: 100.2 kg    Examination:  General exam: Appears calm and comfortable  Respiratory system: Clear to auscultation. Respiratory effort normal. Cardiovascular system: S1 & S2 + No rubs, gallops or clicks.  Gastrointestinal system: Abdomen is nondistended, soft and nontender. Normal bowel sounds heard. Central nervous system: Alert and oriented. Moves all extremities  Psychiatry: Judgement and insight appear normal. Mood & affect appropriate.     Data Reviewed: I  have personally reviewed following labs and imaging studies  CBC: Recent Labs  Lab 11/30/21 0939 12/01/21 0439 12/02/21 0454  WBC  --  13.3* 12.3*  HGB 14.6 12.3* 12.2*  HCT 43.0 36.9* 37.1*  MCV  --  87.4 89.2  PLT  --  335 154   Basic Metabolic Panel: Recent Labs  Lab 11/30/21 0939 12/01/21 0439 12/02/21 0454  NA 137 138 134*  K 4.1 3.9 3.8  CL 99 106 103  CO2  --  23 21*  GLUCOSE 183* 202* 324*  BUN 16 17 20   CREATININE 0.70 0.94 0.85  CALCIUM  --  8.4* 8.2*   GFR: Estimated Creatinine Clearance: 111.8 mL/min (by C-G formula based on SCr of 0.85 mg/dL). Liver Function Tests: No results for input(s): AST, ALT, ALKPHOS, BILITOT, PROT, ALBUMIN in the last 168 hours. No results for input(s): LIPASE, AMYLASE in the last 168 hours. No results for input(s): AMMONIA in the last 168 hours. Coagulation Profile: No results for input(s): INR, PROTIME in the last 168 hours. Cardiac Enzymes: No results for input(s): CKTOTAL, CKMB, CKMBINDEX, TROPONINI in the last 168 hours. BNP (last 3 results) No results for input(s): PROBNP in the last 8760 hours. HbA1C: Recent Labs    11/30/21 2007  HGBA1C 8.5*   CBG: Recent Labs  Lab 12/01/21 0757 12/01/21 1139 12/01/21 1644 12/01/21 2140 12/02/21 0813  GLUCAP 318* 306* 190* 226* 360*   Lipid Profile:  No results for input(s): CHOL, HDL, LDLCALC, TRIG, CHOLHDL, LDLDIRECT in the last 72 hours. Thyroid Function Tests: No results for input(s): TSH, T4TOTAL, FREET4, T3FREE, THYROIDAB in the last 72 hours. Anemia Panel: No results for input(s): VITAMINB12, FOLATE, FERRITIN, TIBC, IRON, RETICCTPCT in the last 72 hours. Sepsis Labs: No results for input(s): PROCALCITON, LATICACIDVEN in the last 168 hours.  Recent Results (from the past 240 hour(s))  SARS CORONAVIRUS 2 (TAT 6-24 HRS) Nasopharyngeal Nasopharyngeal Swab     Status: None   Collection Time: 11/27/21 11:03 AM   Specimen: Nasopharyngeal Swab  Result Value Ref Range  Status   SARS Coronavirus 2 NEGATIVE NEGATIVE Final    Comment: (NOTE) SARS-CoV-2 target nucleic acids are NOT DETECTED.  The SARS-CoV-2 RNA is generally detectable in upper and lower respiratory specimens during the acute phase of infection. Negative results do not preclude SARS-CoV-2 infection, do not rule out co-infections with other pathogens, and should not be used as the sole basis for treatment or other patient management decisions. Negative results must be combined with clinical observations, patient history, and epidemiological information. The expected result is Negative.  Fact Sheet for Patients: SugarRoll.be  Fact Sheet for Healthcare Providers: https://www.woods-mathews.com/  This test is not yet approved or cleared by the [place] FDA and  has been authorized for detection and/or diagnosis of SARS-CoV-2 by FDA under an Emergency Use Authorization (EUA). This EUA will remain  in effect (meaning this test can be used) for the duration of the COVID-19 declaration under Se ction 564(b)(1) of the Act, 21 U.S.C. section 360bbb-3(b)(1), unless the authorization is terminated or revoked sooner.  Performed at [facility], [place] [address], [place], [place] [address]          Radiology Studies: No results found.      Scheduled Meds:  amiodarone  200 mg Oral Daily   Chlorhexidine Gluconate Cloth  6 each Topical Daily   enoxaparin (LOVENOX) injection  1 mg/kg Subcutaneous Q12H   gabapentin  300 mg Oral BID   insulin aspart  0-15 Units Subcutaneous TID WC   insulin aspart  0-5 Units Subcutaneous QHS   insulin aspart  3 Units Subcutaneous TID WC   insulin detemir  15 Units Subcutaneous Daily   magnesium oxide  400 mg Oral Daily   metoprolol tartrate  50 mg Oral BID   pantoprazole (PROTONIX) IV  40 mg Intravenous QHS   torsemide  20 mg Oral Daily   Continuous Infusions:   LOS: 2 days    Time spent: 15 mins      Wyvonnia Dusky, MD [facility] Pager [phone]  If 7PM-7AM, please contact night-coverage 12/02/2021, 8:30 AM

## 2021-12-08 ENCOUNTER — Ambulatory Visit (INDEPENDENT_AMBULATORY_CARE_PROVIDER_SITE_OTHER): Payer: BC Managed Care – PPO

## 2021-12-08 ENCOUNTER — Other Ambulatory Visit: Payer: Self-pay

## 2021-12-08 DIAGNOSIS — E538 Deficiency of other specified B group vitamins: Secondary | ICD-10-CM | POA: Diagnosis not present

## 2021-12-08 MED ORDER — CYANOCOBALAMIN 1000 MCG/ML IJ SOLN
1000.0000 ug | Freq: Once | INTRAMUSCULAR | Status: AC
  Administered 2021-12-08: 1000 ug via INTRAMUSCULAR

## 2021-12-08 NOTE — Progress Notes (Signed)
Per orders of Dr. Gutierrez, monthly injection of B12 given by Derek Huneycutt G Indra Wolters. Patient tolerated injection well.  

## 2021-12-11 LAB — HM DIABETES EYE EXAM

## 2021-12-14 ENCOUNTER — Ambulatory Visit: Payer: BC Managed Care – PPO | Admitting: Family Medicine

## 2021-12-16 ENCOUNTER — Telehealth: Payer: Self-pay

## 2021-12-16 DIAGNOSIS — E1159 Type 2 diabetes mellitus with other circulatory complications: Secondary | ICD-10-CM | POA: Diagnosis not present

## 2021-12-16 DIAGNOSIS — E10649 Type 1 diabetes mellitus with hypoglycemia without coma: Secondary | ICD-10-CM | POA: Diagnosis not present

## 2021-12-16 DIAGNOSIS — Z720 Tobacco use: Secondary | ICD-10-CM | POA: Diagnosis not present

## 2021-12-16 DIAGNOSIS — E1065 Type 1 diabetes mellitus with hyperglycemia: Secondary | ICD-10-CM

## 2021-12-16 DIAGNOSIS — E1069 Type 1 diabetes mellitus with other specified complication: Secondary | ICD-10-CM | POA: Diagnosis not present

## 2021-12-16 DIAGNOSIS — E108 Type 1 diabetes mellitus with unspecified complications: Secondary | ICD-10-CM

## 2021-12-16 NOTE — Telephone Encounter (Signed)
[place] Night - Client Nonclinical Telephone Record  [facility] Client [facility] Night - Client Client Site [place] Provider AA - PHYSICIAN, NOT LISTED- MD Contact Type Call Who Is Calling Patient / Member / Family / Caregiver Caller Name [place] Phone Number [phone] Patient Name Russell Gomez Patient DOB [birth year] Call Type Message Only Information Provided Reason for Call Request for General Office Information Initial Comment Caller is needing a referral from [place] to the [facility] Endocrinology department. Says he's trying to get his doctor switched. Patient of Duty [place]. Disp. Time Disposition Final User 12/16/2021 10:28:10 AM General Information Provided Yes Luna Kitchens Call Closed By: Luna Kitchens Transaction Date/Time: 12/16/2021 10:23:10 AM (ET

## 2021-12-16 NOTE — Telephone Encounter (Signed)
New referral placed to [facility] endocrinology.  Pt previously saw Dr Honor Junes who is no longer with [facility].

## 2022-01-11 ENCOUNTER — Ambulatory Visit (INDEPENDENT_AMBULATORY_CARE_PROVIDER_SITE_OTHER): Payer: BC Managed Care – PPO | Admitting: Family Medicine

## 2022-01-11 ENCOUNTER — Encounter: Payer: Self-pay | Admitting: Family Medicine

## 2022-01-11 ENCOUNTER — Other Ambulatory Visit: Payer: Self-pay

## 2022-01-11 VITALS — BP 122/66 | HR 56 | Temp 97.5°F | Ht 74.0 in | Wt 217.5 lb

## 2022-01-11 DIAGNOSIS — F17200 Nicotine dependence, unspecified, uncomplicated: Secondary | ICD-10-CM | POA: Diagnosis not present

## 2022-01-11 DIAGNOSIS — E538 Deficiency of other specified B group vitamins: Secondary | ICD-10-CM

## 2022-01-11 DIAGNOSIS — F39 Unspecified mood [affective] disorder: Secondary | ICD-10-CM

## 2022-01-11 DIAGNOSIS — R946 Abnormal results of thyroid function studies: Secondary | ICD-10-CM

## 2022-01-11 DIAGNOSIS — E108 Type 1 diabetes mellitus with unspecified complications: Secondary | ICD-10-CM

## 2022-01-11 DIAGNOSIS — F1011 Alcohol abuse, in remission: Secondary | ICD-10-CM

## 2022-01-11 DIAGNOSIS — I4891 Unspecified atrial fibrillation: Secondary | ICD-10-CM

## 2022-01-11 DIAGNOSIS — Z933 Colostomy status: Secondary | ICD-10-CM

## 2022-01-11 DIAGNOSIS — I10 Essential (primary) hypertension: Secondary | ICD-10-CM

## 2022-01-11 MED ORDER — TRAZODONE HCL 50 MG PO TABS: 25.0000 mg | ORAL_TABLET | Freq: Every evening | ORAL | 3 refills | Status: DC | PRN

## 2022-01-11 MED ORDER — CYANOCOBALAMIN 1000 MCG/ML IJ SOLN
1000.0000 ug | Freq: Once | INTRAMUSCULAR | Status: AC
  Administered 2022-01-11: 1000 ug via INTRAMUSCULAR

## 2022-01-11 MED ORDER — B-12 1000 MCG PO CAPS: 1.0000 | ORAL_CAPSULE | Freq: Every day | ORAL | Status: DC

## 2022-01-11 NOTE — Patient Instructions (Addendum)
Try trazodone '50mg'$  1/2-1 tablet at night time for mood and sleep.  ?Good luck on the job search!  ?Good to see you today  ?Return in 3 months for labs and physical.  ?

## 2022-01-11 NOTE — Assessment & Plan Note (Addendum)
Continues monthly injections (latest one today) as well as daily oral B12 replacement  ?

## 2022-01-11 NOTE — Assessment & Plan Note (Signed)
Notes difficulty with anxiety, denies significant depressed mood besides situational from not currenty working. He is in process of searching for a new job - anticipate this will improve once employed. Agree with search for 1st or 2nd shift schedule.  ?Given difficulty with insomnia (anticipate component of shift worker insomnia), will trial trazodone 25-'50mg'$  nightly. Update with effect, reassess at CPE in 3 months.  ?PHQ9 = 8 ?GAD7 = 11 ?MDQ =2, negative screen.  ?

## 2022-01-11 NOTE — Assessment & Plan Note (Signed)
Appreciate cards care (Gollan and Christell Faith). ?Continue eliquis, amiodarone and metoprolol ?

## 2022-01-11 NOTE — Assessment & Plan Note (Addendum)
H/o suppressed thyroid levels. This is monitored by endocrinology.  ?

## 2022-01-11 NOTE — Progress Notes (Signed)
Patient ID: ARA MANO, male    DOB: [birth year], 61 y.o.   MRN: [id number]  This visit was conducted in person.  BP 122/66    Pulse (!) 56    Temp (!) 97.5 F (36.4 C) (Temporal)    Ht '6\' 2"'$  (1.88 m)    Wt 217 lb 8 oz (98.7 kg)    SpO2 98%    BMI 27.93 kg/m    CC: f/u visit  Subjective:   HPI: Russell Gomez is a 61 y.o. male presenting on 01/11/2022 for Follow-up (Wants to discuss clearance to return to work. Also, wants to discuss starting mood med. )   I last saw patient 08/2021 after hospitalization for diverticulitis complicated by bowel perforation s/p sigmoid colon resection and colostomy. At that time midodrine was started for orthostatic hypotension (now off this). Underwent CIWA protocol for alcohol withdrawal during that hospitalization. Completed comprehensive rehab program 8/18-24/2022. Colonoscopy completed 11/2021 (multiple TAs, ?rpt plan (Dr Lysle Pearl). He underwent robotic assisted laparoscopic colostomy reversal on 11/30/2021 (Dr Peyton Najjar). No alcohol since 06/2021!   For vit B12 deficiency - b12 shots were started. Latest injection was 12/08/2021  DM followed by endocrinology Dr Honor Junes last seen 12/2021, on 15u basaglar in the morning and novolog with meals. Was provided with [facility] 3 samples. Remains worried about affordability.   Atrial fibrillation on pacerone and eliquis and metoprolol followed by cardiology.   Would like to discuss mood - wife finds him more irritable, told he needs a "mood stabilizer". Situational depression from not working. Notes longstanding h/o excess worrying. No manic or hypomanic symptoms. Never concerned or told he had bipolar. No fmhx bipolar. Notes trouble with falling and staying asleep. Only sleeps 5 hours then wakes up to urinate.   Previously working 3rd shift at [employer] - has decided against working 3rd shift at this time. Looking for new job.  Smoking - down to 1/2 ppd, he is vaping to help hip quit.       Relevant past  medical, surgical, family and social history reviewed and updated as indicated. Interim medical history since our last visit reviewed. Allergies and medications reviewed and updated. Outpatient Medications Prior to Visit  Medication Sig Dispense Refill   amiodarone (PACERONE) 200 MG tablet Take 200 mg by mouth daily.     apixaban (ELIQUIS) 5 MG TABS tablet Take 1 tablet (5 mg total) by mouth 2 (two) times daily. 60 tablet 5   cyanocobalamin (,VITAMIN B-12,) 1000 MCG/ML injection Inject 1 mL (1,000 mcg total) into the muscle every 30 (thirty) days.     insulin NPH Human (NOVOLIN N) 100 UNIT/ML injection Inject 15 Units into the skin 2 (two) times daily before a meal.     insulin regular (NOVOLIN R) 100 units/mL injection Inject 2-15 Units into the skin 3 (three) times daily before meals.     magnesium oxide (MAG-OX) 400 (240 Mg) MG tablet Take 1 tablet (400 mg total) by mouth daily. 30 tablet 6   metoprolol tartrate (LOPRESSOR) 50 MG tablet Take 1 tablet (50 mg total) by mouth 2 (two) times daily. (Patient taking differently: Take 25 mg by mouth 2 (two) times daily.) 180 tablet 3   pantoprazole (PROTONIX) 40 MG tablet Take 1 tablet (40 mg total) by mouth at bedtime. 90 tablet 3   pravastatin (PRAVACHOL) 80 MG tablet Take 1 tablet (80 mg total) by mouth daily. 90 tablet 0   torsemide (DEMADEX) 20 MG tablet Take 1 tablet (  20 mg total) by mouth daily. 30 tablet 11   VITAMIN D PO Take 5,000 Units by mouth daily.     Cyanocobalamin (B-12 PO) Take 1 tablet by mouth daily.     acetaminophen (TYLENOL) 325 MG tablet Take 650 mg by mouth every 6 (six) hours as needed.     No facility-administered medications prior to visit.     Per HPI unless specifically indicated in ROS section below Review of Systems  Objective:  BP 122/66    Pulse (!) 56    Temp (!) 97.5 F (36.4 C) (Temporal)    Ht '6\' 2"'$  (1.88 m)    Wt 217 lb 8 oz (98.7 kg)    SpO2 98%    BMI 27.93 kg/m   Wt Readings from Last 3 Encounters:   01/11/22 217 lb 8 oz (98.7 kg)  11/30/21 220 lb 14.4 oz (100.2 kg)  11/19/21 221 lb (100.2 kg)      Physical Exam Vitals and nursing note reviewed.  Constitutional:      Appearance: Normal appearance. He is not ill-appearing.  Cardiovascular:     Rate and Rhythm: Normal rate and regular rhythm.     Pulses: Normal pulses.     Heart sounds: Normal heart sounds. No murmur heard. Pulmonary:     Effort: Pulmonary effort is normal. No respiratory distress.     Breath sounds: Normal breath sounds. No wheezing, rhonchi or rales.  Musculoskeletal:     Right lower leg: No edema.     Left lower leg: No edema.  Skin:    General: Skin is warm and dry.     Findings: No rash.  Neurological:     Mental Status: He is alert.  Psychiatric:        Mood and Affect: Mood normal.        Behavior: Behavior normal.       Lab Results  Component Value Date   TSH 0.01 (L) 08/25/2021    Lab Results  Component Value Date   HGBA1C 8.5 (H) 11/30/2021    Depression screen [facility] 2/9 01/11/2022 04/16/2020 04/16/2019 02/01/2018  Decreased Interest 2 0 0 0  Down, Depressed, Hopeless 1 0 0 0  PHQ - 2 Score 3 0 0 0  Altered sleeping 1 - - -  Tired, decreased energy 2 - - -  Change in appetite 2 - - -  Feeling bad or failure about yourself  0 - - -  Trouble concentrating 1 - - -  Moving slowly or fidgety/restless 0 - - -  Suicidal thoughts 0 - - -  PHQ-9 Score 9 - - -  Some recent data might be hidden    GAD 7 : Generalized Anxiety Score 01/11/2022  Nervous, Anxious, on Edge 1  Control/stop worrying 3  Worry too much - different things 3  Trouble relaxing 1  Restless 1  Easily annoyed or irritable 3  Afraid - awful might happen 0  Total GAD 7 Score 12   Assessment & Plan:  This visit occurred during the SARS-CoV-2 public health emergency.  Safety protocols were in place, including screening questions prior to the visit, additional usage of staff PPE, and extensive cleaning of exam room while observing  appropriate contact time as indicated for disinfecting solutions.   Problem List Items Addressed This Visit     Type 1 diabetes mellitus with complications ([place])    Appreciate endo care ([place]). He will continue f/u with endo for T1DM. Worried about cost  of freestyle libre 3 system      TOBACCO ABUSE    Action phase - down to 1/2 ppd smoking. He is vaping in place of cigarettes.       Essential hypertension    Chronic, stable on metoprolol '50mg'$  bid.       Abnormal thyroid function test    H/o suppressed thyroid levels. This is monitored by endocrinology.       Vitamin B12 deficiency    Continues monthly injections (latest one today) as well as daily oral B12 replacement       History of alcohol abuse    Abstinent since 06/2021! Congratulated.       Atrial fibrillation with RVR ([place])    Appreciate cards care (Gollan and Christell Faith). Continue eliquis, amiodarone and metoprolol      Mood disorder ([place]) - Primary    Notes difficulty with anxiety, denies significant depressed mood besides situational from not currenty working. He is in process of searching for a new job - anticipate this will improve once employed. Agree with search for 1st or 2nd shift schedule.  Given difficulty with insomnia (anticipate component of shift worker insomnia), will trial trazodone 25-'50mg'$  nightly. Update with effect, reassess at CPE in 3 months.  PHQ9 = 8 GAD7 = 11 MDQ =2, negative screen.       RESOLVED: Colostomy status ([place])    S/p colostomy takedown 11/2021.         Meds ordered this encounter  Medications   cyanocobalamin ((VITAMIN B-12)) injection 1,000 mcg   Cyanocobalamin (B-12) 1000 MCG CAPS    Sig: Take 1 tablet by mouth daily.   traZODone (DESYREL) 50 MG tablet    Sig: Take 0.5-1 tablets (25-50 mg total) by mouth at bedtime as needed for sleep.    Dispense:  30 tablet    Refill:  3   No orders of the defined types were placed in this encounter.    Patient Instructions   Try trazodone '50mg'$  1/2-1 tablet at night time for mood and sleep.  Good luck on the job search!  Good to see you today  Return in 3 months for labs and physical.   Follow up plan: Return in about 3 months (around 04/13/2022), or if symptoms worsen or fail to improve, for annual exam, prior fasting for blood work.  Ria Bush, MD

## 2022-01-11 NOTE — Assessment & Plan Note (Addendum)
Appreciate endo care ([place]). He will continue f/u with endo for T1DM. Worried about cost of freestyle libre 3 system ?

## 2022-01-11 NOTE — Assessment & Plan Note (Signed)
Chronic, stable on metoprolol '50mg'$  bid.  ?

## 2022-01-11 NOTE — Assessment & Plan Note (Signed)
S/p colostomy takedown 11/2021.  ?

## 2022-01-11 NOTE — Assessment & Plan Note (Signed)
Action phase - down to 1/2 ppd smoking. He is vaping in place of cigarettes.  ?

## 2022-01-11 NOTE — Assessment & Plan Note (Addendum)
Abstinent since 06/2021! Congratulated.  ?
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
## 2022-02-09 ENCOUNTER — Ambulatory Visit: Payer: BC Managed Care – PPO | Admitting: Physician Assistant

## 2022-02-09 DIAGNOSIS — Z720 Tobacco use: Secondary | ICD-10-CM

## 2022-02-09 DIAGNOSIS — I10 Essential (primary) hypertension: Secondary | ICD-10-CM

## 2022-02-09 DIAGNOSIS — I480 Paroxysmal atrial fibrillation: Secondary | ICD-10-CM

## 2022-02-09 DIAGNOSIS — Z87898 Personal history of other specified conditions: Secondary | ICD-10-CM

## 2022-02-09 DIAGNOSIS — E0590 Thyrotoxicosis, unspecified without thyrotoxic crisis or storm: Secondary | ICD-10-CM

## 2022-02-10 ENCOUNTER — Encounter: Payer: Self-pay | Admitting: Physician Assistant

## 2022-02-24 LAB — SURGICAL PATHOLOGY

## 2022-02-26 ENCOUNTER — Telehealth: Payer: Self-pay

## 2022-02-26 MED ORDER — PRAVASTATIN SODIUM 80 MG PO TABS: 80.0000 mg | ORAL_TABLET | Freq: Every day | ORAL | 0 refills | Status: DC

## 2022-02-26 NOTE — Telephone Encounter (Signed)
E-scribed refill to [facility] [address] ? ?Spoke with pt notifying him rx was sent.  Expresses his thanks.   ?

## 2022-02-26 NOTE — Telephone Encounter (Signed)
Patient is calling about his prescription that was sent to wrong pharmacy. Patient is requesting prescription for pravastatin (PRAVACHOL) 80 MG tablet to be sent to [facility] we have on file. Thank you please advise

## 2022-03-01 ENCOUNTER — Emergency Department: Payer: Worker's Compensation

## 2022-03-01 ENCOUNTER — Other Ambulatory Visit: Payer: Self-pay

## 2022-03-01 ENCOUNTER — Emergency Department
Admission: EM | Admit: 2022-03-01 | Discharge: 2022-03-01 | Disposition: A | Discharge status: Home / Self Care | Payer: Worker's Compensation | Attending: Emergency Medicine | Admitting: Emergency Medicine

## 2022-03-01 DIAGNOSIS — R079 Chest pain, unspecified: Secondary | ICD-10-CM | POA: Insufficient documentation

## 2022-03-01 DIAGNOSIS — M25561 Pain in right knee: Secondary | ICD-10-CM | POA: Insufficient documentation

## 2022-03-01 DIAGNOSIS — I10 Essential (primary) hypertension: Secondary | ICD-10-CM | POA: Diagnosis not present

## 2022-03-01 DIAGNOSIS — Z7901 Long term (current) use of anticoagulants: Secondary | ICD-10-CM | POA: Insufficient documentation

## 2022-03-01 DIAGNOSIS — E119 Type 2 diabetes mellitus without complications: Secondary | ICD-10-CM | POA: Diagnosis not present

## 2022-03-01 DIAGNOSIS — S8991XA Unspecified injury of right lower leg, initial encounter: Secondary | ICD-10-CM | POA: Diagnosis present

## 2022-03-01 DIAGNOSIS — I4891 Unspecified atrial fibrillation: Secondary | ICD-10-CM | POA: Insufficient documentation

## 2022-03-01 DIAGNOSIS — Y92410 Unspecified street and highway as the place of occurrence of the external cause: Secondary | ICD-10-CM | POA: Insufficient documentation

## 2022-03-01 MED ORDER — HYDROCODONE-ACETAMINOPHEN 5-325 MG PO TABS: 1.0000 | ORAL_TABLET | ORAL | 0 refills | Status: AC | PRN

## 2022-03-01 NOTE — Discharge Instructions (Addendum)
--   Take ibuprofen as needed for pain.  Utilize prescription for hydrocodone/acetaminophen sparingly. ?-Rest and ice the affected knee for 24 to 48 hours.  Recommend avoiding strenuous activities for at least 1 to 2 weeks ?-Follow-up with the orthopedist listed above as needed if your symptoms fail to improve after a few weeks. ?-Return to the emergency department anytime if you begin to experience any new or worsening symptoms. ?
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
## 2022-03-03 DIAGNOSIS — R410 Disorientation, unspecified: Secondary | ICD-10-CM | POA: Diagnosis not present

## 2022-03-03 DIAGNOSIS — E161 Other hypoglycemia: Secondary | ICD-10-CM | POA: Diagnosis not present

## 2022-03-03 DIAGNOSIS — E162 Hypoglycemia, unspecified: Secondary | ICD-10-CM | POA: Diagnosis not present

## 2022-03-22 ENCOUNTER — Other Ambulatory Visit: Payer: Self-pay | Admitting: Family Medicine

## 2022-03-23 NOTE — Telephone Encounter (Signed)
Eliquis ?Last filled:  01/19/22, #60 ?Last OV:  01/11/22, hosp f/u ?Next OV:  04/14/22, CPE ?

## 2022-03-24 NOTE — Telephone Encounter (Signed)
ERx 

## 2022-04-06 ENCOUNTER — Other Ambulatory Visit: Payer: Self-pay | Admitting: Family Medicine

## 2022-04-06 DIAGNOSIS — Z125 Encounter for screening for malignant neoplasm of prostate: Secondary | ICD-10-CM

## 2022-04-06 DIAGNOSIS — E559 Vitamin D deficiency, unspecified: Secondary | ICD-10-CM

## 2022-04-06 DIAGNOSIS — E108 Type 1 diabetes mellitus with unspecified complications: Secondary | ICD-10-CM

## 2022-04-06 DIAGNOSIS — E519 Thiamine deficiency, unspecified: Secondary | ICD-10-CM

## 2022-04-06 DIAGNOSIS — E538 Deficiency of other specified B group vitamins: Secondary | ICD-10-CM

## 2022-04-06 DIAGNOSIS — E1069 Type 1 diabetes mellitus with other specified complication: Secondary | ICD-10-CM

## 2022-04-06 DIAGNOSIS — D539 Nutritional anemia, unspecified: Secondary | ICD-10-CM

## 2022-04-06 DIAGNOSIS — E213 Hyperparathyroidism, unspecified: Secondary | ICD-10-CM

## 2022-04-07 ENCOUNTER — Other Ambulatory Visit: Payer: BC Managed Care – PPO

## 2022-04-14 ENCOUNTER — Encounter: Payer: BC Managed Care – PPO | Admitting: Family Medicine

## 2022-04-21 ENCOUNTER — Other Ambulatory Visit: Payer: Self-pay

## 2022-04-21 DIAGNOSIS — R5383 Other fatigue: Secondary | ICD-10-CM | POA: Diagnosis not present

## 2022-04-21 DIAGNOSIS — I152 Hypertension secondary to endocrine disorders: Secondary | ICD-10-CM | POA: Diagnosis not present

## 2022-04-21 DIAGNOSIS — Z720 Tobacco use: Secondary | ICD-10-CM | POA: Diagnosis not present

## 2022-04-21 DIAGNOSIS — E785 Hyperlipidemia, unspecified: Secondary | ICD-10-CM | POA: Diagnosis not present

## 2022-04-21 DIAGNOSIS — E10649 Type 1 diabetes mellitus with hypoglycemia without coma: Secondary | ICD-10-CM | POA: Diagnosis not present

## 2022-04-21 DIAGNOSIS — E538 Deficiency of other specified B group vitamins: Secondary | ICD-10-CM | POA: Diagnosis not present

## 2022-04-21 DIAGNOSIS — E1069 Type 1 diabetes mellitus with other specified complication: Secondary | ICD-10-CM | POA: Diagnosis not present

## 2022-04-21 DIAGNOSIS — E559 Vitamin D deficiency, unspecified: Secondary | ICD-10-CM | POA: Diagnosis not present

## 2022-04-21 LAB — LIPID PANEL
Cholesterol: 164 (ref 0–200)
HDL: 47 (ref 35–70)
LDL Cholesterol: 97
Triglycerides: 102 (ref 40–160)

## 2022-04-21 LAB — VITAMIN B12: Vitamin B-12: 218

## 2022-04-21 LAB — HEMOGLOBIN A1C: Hemoglobin A1C: 7.6

## 2022-04-21 LAB — TSH: TSH: 0.12 — AB (ref 0.41–5.90)

## 2022-04-21 MED ORDER — MAGNESIUM OXIDE -MG SUPPLEMENT 400 (240 MG) MG PO TABS: 400.0000 mg | ORAL_TABLET | Freq: Every day | ORAL | 6 refills | Status: DC

## 2022-05-30 ENCOUNTER — Other Ambulatory Visit: Payer: Self-pay | Admitting: Family Medicine

## 2022-06-21 DIAGNOSIS — A630 Anogenital (venereal) warts: Secondary | ICD-10-CM | POA: Diagnosis not present

## 2022-06-21 DIAGNOSIS — R238 Other skin changes: Secondary | ICD-10-CM | POA: Diagnosis not present

## 2022-07-08 DIAGNOSIS — R238 Other skin changes: Secondary | ICD-10-CM | POA: Diagnosis not present

## 2022-07-08 DIAGNOSIS — B078 Other viral warts: Secondary | ICD-10-CM | POA: Diagnosis not present

## 2022-07-08 DIAGNOSIS — A630 Anogenital (venereal) warts: Secondary | ICD-10-CM | POA: Diagnosis not present

## 2022-07-23 ENCOUNTER — Encounter: Payer: Self-pay | Admitting: Family Medicine

## 2022-07-23 ENCOUNTER — Ambulatory Visit (INDEPENDENT_AMBULATORY_CARE_PROVIDER_SITE_OTHER): Payer: BC Managed Care – PPO | Admitting: Family Medicine

## 2022-07-23 VITALS — BP 128/70 | HR 68 | Temp 97.3°F | Ht 70.0 in | Wt 206.0 lb

## 2022-07-23 DIAGNOSIS — Z125 Encounter for screening for malignant neoplasm of prostate: Secondary | ICD-10-CM | POA: Diagnosis not present

## 2022-07-23 DIAGNOSIS — I714 Abdominal aortic aneurysm, without rupture, unspecified: Secondary | ICD-10-CM

## 2022-07-23 DIAGNOSIS — I4891 Unspecified atrial fibrillation: Secondary | ICD-10-CM

## 2022-07-23 DIAGNOSIS — R7989 Other specified abnormal findings of blood chemistry: Secondary | ICD-10-CM | POA: Diagnosis not present

## 2022-07-23 DIAGNOSIS — E213 Hyperparathyroidism, unspecified: Secondary | ICD-10-CM

## 2022-07-23 DIAGNOSIS — E108 Type 1 diabetes mellitus with unspecified complications: Secondary | ICD-10-CM | POA: Diagnosis not present

## 2022-07-23 DIAGNOSIS — Z0001 Encounter for general adult medical examination with abnormal findings: Secondary | ICD-10-CM | POA: Diagnosis not present

## 2022-07-23 DIAGNOSIS — Z23 Encounter for immunization: Secondary | ICD-10-CM

## 2022-07-23 DIAGNOSIS — E785 Hyperlipidemia, unspecified: Secondary | ICD-10-CM

## 2022-07-23 DIAGNOSIS — R600 Localized edema: Secondary | ICD-10-CM

## 2022-07-23 DIAGNOSIS — E538 Deficiency of other specified B group vitamins: Secondary | ICD-10-CM

## 2022-07-23 DIAGNOSIS — E559 Vitamin D deficiency, unspecified: Secondary | ICD-10-CM | POA: Diagnosis not present

## 2022-07-23 DIAGNOSIS — E1069 Type 1 diabetes mellitus with other specified complication: Secondary | ICD-10-CM

## 2022-07-23 DIAGNOSIS — D539 Nutritional anemia, unspecified: Secondary | ICD-10-CM

## 2022-07-23 DIAGNOSIS — R946 Abnormal results of thyroid function studies: Secondary | ICD-10-CM | POA: Diagnosis not present

## 2022-07-23 DIAGNOSIS — E519 Thiamine deficiency, unspecified: Secondary | ICD-10-CM | POA: Diagnosis not present

## 2022-07-23 DIAGNOSIS — F17200 Nicotine dependence, unspecified, uncomplicated: Secondary | ICD-10-CM

## 2022-07-23 DIAGNOSIS — F1011 Alcohol abuse, in remission: Secondary | ICD-10-CM

## 2022-07-23 DIAGNOSIS — I10 Essential (primary) hypertension: Secondary | ICD-10-CM

## 2022-07-23 DIAGNOSIS — K219 Gastro-esophageal reflux disease without esophagitis: Secondary | ICD-10-CM

## 2022-07-23 LAB — FOLATE: Folate: 16.9 ng/mL (ref >5.9)

## 2022-07-23 LAB — MICROALBUMIN / CREATININE URINE RATIO
Creatinine,U: 93.4 mg/dL
Microalb Creat Ratio: 0.7 mg/g (ref 0.0–30.0)
Microalb, Ur: <0.7 mg/dL (ref 0.0–1.9)

## 2022-07-23 LAB — HEMOGLOBIN A1C: Hgb A1c MFr Bld: 7.6 % — ABNORMAL HIGH (ref 4.6–6.5)

## 2022-07-23 LAB — CBC WITH DIFFERENTIAL/PLATELET
Basophils Absolute: 0.1 10*3/uL (ref 0.0–0.1)
Basophils Relative: 0.7 % (ref 0.0–3.0)
Eosinophils Absolute: 0.3 10*3/uL (ref 0.0–0.7)
Eosinophils Relative: 3 % (ref 0.0–5.0)
HCT: 44.5 % (ref 39.0–52.0)
Hemoglobin: 15.1 g/dL (ref 13.0–17.0)
Lymphocytes Relative: 28.3 % (ref 12.0–46.0)
Lymphs Abs: 2.4 10*3/uL (ref 0.7–4.0)
MCHC: 34 g/dL (ref 30.0–36.0)
MCV: 92.2 fl (ref 78.0–100.0)
Monocytes Absolute: 0.5 10*3/uL (ref 0.1–1.0)
Monocytes Relative: 5.6 % (ref 3.0–12.0)
Neutro Abs: 5.3 10*3/uL (ref 1.4–7.7)
Neutrophils Relative %: 62.4 % (ref 43.0–77.0)
Platelets: 328 10*3/uL (ref 150.0–400.0)
RBC: 4.83 Mil/uL (ref 4.22–5.81)
RDW: 13.6 % (ref 11.5–15.5)
WBC: 8.5 10*3/uL (ref 4.0–10.5)

## 2022-07-23 LAB — COMPREHENSIVE METABOLIC PANEL
ALT: 13 U/L (ref 0–53)
AST: 13 U/L (ref 0–37)
Albumin: 4.2 g/dL (ref 3.5–5.2)
Alkaline Phosphatase: 84 U/L (ref 39–117)
BUN: 15 mg/dL (ref 6–23)
CO2: 31 mEq/L (ref 19–32)
Calcium: 9.9 mg/dL (ref 8.4–10.5)
Chloride: 99 mEq/L (ref 96–112)
Creatinine, Ser: 0.94 mg/dL (ref 0.40–1.50)
GFR: 88.54 mL/min (ref >60.00)
Glucose, Bld: 51 mg/dL — ABNORMAL LOW (ref 70–99)
Potassium: 4.8 mEq/L (ref 3.5–5.1)
Sodium: 139 mEq/L (ref 135–145)
Total Bilirubin: 0.7 mg/dL (ref 0.2–1.2)
Total Protein: 7.2 g/dL (ref 6.0–8.3)

## 2022-07-23 LAB — PSA: PSA: 0.33 ng/mL (ref 0.10–4.00)

## 2022-07-23 LAB — TSH: TSH: 0.3 u[IU]/mL — ABNORMAL LOW (ref 0.35–5.50)

## 2022-07-23 LAB — VITAMIN D 25 HYDROXY (VIT D DEFICIENCY, FRACTURES): VITD: 45.59 ng/mL (ref 30.00–100.00)

## 2022-07-23 LAB — T4, FREE: Free T4: 1.03 ng/dL (ref 0.60–1.60)

## 2022-07-23 MED ORDER — APIXABAN 5 MG PO TABS: ORAL_TABLET | ORAL | 3 refills | Status: DC

## 2022-07-23 MED ORDER — TORSEMIDE 20 MG PO TABS: 20.0000 mg | ORAL_TABLET | Freq: Every day | ORAL | 3 refills | Status: DC

## 2022-07-23 MED ORDER — PRAVASTATIN SODIUM 80 MG PO TABS: ORAL_TABLET | ORAL | 3 refills | Status: DC

## 2022-07-23 MED ORDER — PANTOPRAZOLE SODIUM 40 MG PO TBEC: 40.0000 mg | DELAYED_RELEASE_TABLET | Freq: Every day | ORAL | 3 refills | Status: DC

## 2022-07-23 MED ORDER — CYANOCOBALAMIN 1000 MCG/ML IJ SOLN
1000.0000 ug | Freq: Once | INTRAMUSCULAR | Status: AC
  Administered 2022-07-23: 1000 ug via INTRAMUSCULAR

## 2022-07-23 NOTE — Assessment & Plan Note (Signed)
Sees cardiology, on eliquis and metoprolol. Sounds regular today.

## 2022-07-23 NOTE — Assessment & Plan Note (Signed)
b12 shot today - rec monthly B12 shots x6 months then reassess control.

## 2022-07-23 NOTE — Assessment & Plan Note (Signed)
Update levels off replacement. 

## 2022-07-23 NOTE — Assessment & Plan Note (Signed)
Chronic, continues metoprolol '50mg'$  bid through cardiology as well as torsemide '20mg'$  daily.

## 2022-07-23 NOTE — Assessment & Plan Note (Signed)
Update CBC with vitamin levels

## 2022-07-23 NOTE — Assessment & Plan Note (Signed)
Abstinent since 06/2021 - congratulated!

## 2022-07-23 NOTE — Assessment & Plan Note (Signed)
Stable period on pantoprazole 40mg daily  

## 2022-07-23 NOTE — Patient Instructions (Addendum)
Labs today  We will request records for diabetic eye exam [facility] in [place] B12 shot today then schedule monthly shots for 6 months and we will recheck levels after that.  Touch base with general surgery about follow up plan for colonoscopy.  We will refer you to lung cancer screening program.  First shingrix shot today. Schedule nurse visit in 2-6 months for 2nd shigrix shot.  Good to see you today Return as needed or in 6 months for follow up visit.   Health Maintenance, Male Adopting a healthy lifestyle and getting preventive care are important in promoting health and wellness. Ask your health care provider about: The right schedule for you to have regular tests and exams. Things you can do on your own to prevent diseases and keep yourself healthy. What should I know about diet, weight, and exercise? Eat a healthy diet  Eat a diet that includes plenty of vegetables, fruits, low-fat dairy products, and lean protein. Do not eat a lot of foods that are high in solid fats, added sugars, or sodium. Maintain a healthy weight Body mass index (BMI) is a measurement that can be used to identify possible weight problems. It estimates body fat based on height and weight. Your health care provider can help determine your BMI and help you achieve or maintain a healthy weight. Get regular exercise Get regular exercise. This is one of the most important things you can do for your health. Most adults should: Exercise for at least 150 minutes each week. The exercise should increase your heart rate and make you sweat (moderate-intensity exercise). Do strengthening exercises at least twice a week. This is in addition to the moderate-intensity exercise. Spend less time sitting. Even light physical activity can be beneficial. Watch cholesterol and blood lipids Have your blood tested for lipids and cholesterol at 60 years of age, then have this test every 5 years. You may need to have your  cholesterol levels checked more often if: Your lipid or cholesterol levels are high. You are older than 60 years of age. You are at high risk for heart disease. What should I know about cancer screening? Many types of cancers can be detected early and may often be prevented. Depending on your health history and family history, you may need to have cancer screening at various ages. This may include screening for: Colorectal cancer. Prostate cancer. Skin cancer. Lung cancer. What should I know about heart disease, diabetes, and high blood pressure? Blood pressure and heart disease High blood pressure causes heart disease and increases the risk of stroke. This is more likely to develop in people who have high blood pressure readings or are overweight. Talk with your health care provider about your target blood pressure readings. Have your blood pressure checked: Every 3-5 years if you are 73-24 years of age. Every year if you are 43 years old or older. If you are between the ages of 69 and 47 and are a current or former smoker, ask your health care provider if you should have a one-time screening for abdominal aortic aneurysm (AAA). Diabetes Have regular diabetes screenings. This checks your fasting blood sugar level. Have the screening done: Once every three years after age 17 if you are at a normal weight and have a low risk for diabetes. More often and at a younger age if you are overweight or have a high risk for diabetes. What should I know about preventing infection? Hepatitis B If you have a higher risk for  hepatitis B, you should be screened for this virus. Talk with your health care provider to find out if you are at risk for hepatitis B infection. Hepatitis C Blood testing is recommended for: Everyone born from [birth year] through 1964. Anyone with known risk factors for hepatitis C. Sexually transmitted infections (STIs) You should be screened each year for STIs, including gonorrhea  and chlamydia, if: You are sexually active and are younger than 60 years of age. You are older than 60 years of age and your health care provider tells you that you are at risk for this type of infection. Your sexual activity has changed since you were last screened, and you are at increased risk for chlamydia or gonorrhea. Ask your health care provider if you are at risk. Ask your health care provider about whether you are at high risk for HIV. Your health care provider may recommend a prescription medicine to help prevent HIV infection. If you choose to take medicine to prevent HIV, you should first get tested for HIV. You should then be tested every 3 months for as long as you are taking the medicine. Follow these instructions at home: Alcohol use Do not drink alcohol if your health care provider tells you not to drink. If you drink alcohol: Limit how much you have to 0-2 drinks a day. Know how much alcohol is in your drink. In the [place] one drink equals one 12 oz bottle of beer (355 mL), one 5 oz glass of wine (148 mL), or one 1 oz glass of hard liquor (44 mL). Lifestyle Do not use any products that contain nicotine or tobacco. These products include cigarettes, chewing tobacco, and vaping devices, such as e-cigarettes. If you need help quitting, ask your health care provider. Do not use street drugs. Do not share needles. Ask your health care provider for help if you need support or information about quitting drugs. General instructions Schedule regular health, dental, and eye exams. Stay current with your vaccines. Tell your health care provider if: You often feel depressed. You have ever been abused or do not feel safe at home. Summary Adopting a healthy lifestyle and getting preventive care are important in promoting health and wellness. Follow your health care provider's instructions about healthy diet, exercising, and getting tested or screened for diseases. Follow your health  care provider's instructions on monitoring your cholesterol and blood pressure. This information is not intended to replace advice given to you by your health care provider. Make sure you discuss any questions you have with your health care provider. Document Revised: 03/16/2021 Document Reviewed: 03/16/2021 Elsevier Patient Education  [place].

## 2022-07-23 NOTE — Progress Notes (Signed)
Patient ID: Russell Gomez, male    DOB: 06-22-1961, 61 y.o.   MRN: [id number]  This visit was conducted in person.  BP 128/70   Pulse 68   Temp (!) 97.3 F (36.3 C) (Temporal)   Ht '5\' 10"'$  (1.778 m)   Wt 206 lb (93.4 kg)   SpO2 98%   BMI 29.56 kg/m    CC: CPE Subjective:   HPI: Russell Gomez is a 61 y.o. male presenting on 07/23/2022 for Annual Exam (Concerned about recent wt loss. )   He's been feeling very well - hasn't drank any alcohol in over a year.  Notes 14 lb weight loss over this year, unintentional.   T1DM - reviewed labs through [facility] - A1c 7.6%, B12 218, FLP with TC 164, Trig 102, HDL 47, LDL 97, TSH 0.119. Freestyle Libre 3 was unaffordable - using One Touch meter. Using N 13/13u daily, R 5u in am and 4u in evenings.   Not currently taking vitamin B12 or D.   Preventative: COLONOSCOPY WITH PROPOFOL 11/19/2021 - multiple TAs, rpt ? yrs Lysle Pearl, [place], DO)  Prostate cancer screening - yearly PSA  Lung cancer screening - interested. 40 PY hx. will refer Flu - declines  COVID vaccine - declines Pneumovax - 02/2015  Td 2010, Tdap 11/2017 Shingrix - declined  Seat belt use discussed Sunscreen use discussed, no changing moles on skin.  Smoker - 1 ppd for ~40 years, currently 1/4 ppd, vaping.  Alcohol - extensive use previously, fully quit 2022! Dentist - full dentures (temporary) 2023 Eye exam - 12/2021   Caffeine: 4-5 cups/night Lives with wife, youngest son (43), no pets Occupation: Works 3rd shift; Furniture conservator/restorer at [employer]  Edu: 65yrMachinist degree Activity: walking, playing golf weekly Diet: good water, fruits/vegetables daily      Relevant past medical, surgical, family and social history reviewed and updated as indicated. Interim medical history since our last visit reviewed. Allergies and medications reviewed and updated. Outpatient Medications Prior to Visit  Medication Sig Dispense Refill   amiodarone (PACERONE) 200 MG tablet  Take 200 mg by mouth daily.     cyanocobalamin (,VITAMIN B-12,) 1000 MCG/ML injection Inject 1 mL (1,000 mcg total) into the muscle every 30 (thirty) days.     insulin regular (NOVOLIN R) 100 units/mL injection Inject 2-15 Units into the skin 3 (three) times daily before meals.     magnesium oxide (MAG-OX) 400 (240 Mg) MG tablet Take 1 tablet (400 mg total) by mouth daily. 30 tablet 6   ELIQUIS 5 MG TABS tablet TAKE 1 TABLET ('5MG'$  TOTAL) BY MOUTH 2 TIMES A DAY. 60 tablet 6   insulin NPH Human (NOVOLIN N) 100 UNIT/ML injection Inject 15 Units into the skin 2 (two) times daily before a meal.     pantoprazole (PROTONIX) 40 MG tablet Take 1 tablet (40 mg total) by mouth at bedtime. 90 tablet 3   pravastatin (PRAVACHOL) 80 MG tablet TAKE 1 TABLET(80 MG) BY MOUTH DAILY 90 tablet 0   torsemide (DEMADEX) 20 MG tablet Take 1 tablet (20 mg total) by mouth daily. 30 tablet 11   insulin NPH Human (NOVOLIN N) 100 UNIT/ML injection Inject 0.13 mLs (13 Units total) into the skin 2 (two) times daily before a meal. 10 mL    metoprolol tartrate (LOPRESSOR) 50 MG tablet Take 1 tablet (50 mg total) by mouth 2 (two) times daily. (Patient taking differently: Take 25 mg by mouth 2 (two) times daily.) 180  tablet 3   Cyanocobalamin (B-12) 1000 MCG CAPS Take 1 tablet by mouth daily.     traZODone (DESYREL) 50 MG tablet Take 0.5-1 tablets (25-50 mg total) by mouth at bedtime as needed for sleep. (Patient not taking: Reported on 07/23/2022) 30 tablet 3   VITAMIN D PO Take 5,000 Units by mouth daily.     No facility-administered medications prior to visit.     Per HPI unless specifically indicated in ROS section below Review of Systems  Constitutional:  Positive for appetite change. Negative for activity change, chills, fatigue, fever and unexpected weight change.  HENT:  Negative for hearing loss.   Eyes:  Negative for visual disturbance.  Respiratory:  Negative for cough (improved with less smoking), chest tightness,  shortness of breath and wheezing.   Cardiovascular:  Negative for chest pain, palpitations and leg swelling.  Gastrointestinal:  Negative for abdominal distention, abdominal pain, blood in stool, constipation, diarrhea, nausea and vomiting.  Genitourinary:  Negative for difficulty urinating and hematuria.  Musculoskeletal:  Negative for arthralgias, myalgias and neck pain.  Skin:  Negative for rash.  Neurological:  Negative for dizziness, seizures, syncope and headaches.  Hematological:  Negative for adenopathy. Does not bruise/bleed easily.  Psychiatric/Behavioral:  Negative for dysphoric mood. The patient is not nervous/anxious.     Objective:  BP 128/70   Pulse 68   Temp (!) 97.3 F (36.3 C) (Temporal)   Ht '5\' 10"'$  (1.778 m)   Wt 206 lb (93.4 kg)   SpO2 98%   BMI 29.56 kg/m   Wt Readings from Last 3 Encounters:  07/23/22 206 lb (93.4 kg)  03/01/22 180 lb (81.6 kg)  01/11/22 217 lb 8 oz (98.7 kg)      Physical Exam Vitals and nursing note reviewed.  Constitutional:      General: He is not in acute distress.    Appearance: Normal appearance. He is well-developed. He is not ill-appearing.  HENT:     Head: Normocephalic and atraumatic.     Right Ear: Hearing, tympanic membrane, ear canal and external ear normal.     Left Ear: Hearing, tympanic membrane, ear canal and external ear normal.  Eyes:     General: No scleral icterus.    Extraocular Movements: Extraocular movements intact.     Conjunctiva/sclera: Conjunctivae normal.     Pupils: Pupils are equal, round, and reactive to light.  Neck:     Thyroid: No thyroid mass or thyromegaly.  Cardiovascular:     Rate and Rhythm: Normal rate and regular rhythm.     Pulses: Normal pulses.          Radial pulses are 2+ on the right side and 2+ on the left side.     Heart sounds: Normal heart sounds. No murmur heard. Pulmonary:     Effort: Pulmonary effort is normal. No respiratory distress.     Breath sounds: Normal breath  sounds. No wheezing, rhonchi or rales.  Abdominal:     General: Bowel sounds are normal. There is no distension.     Palpations: Abdomen is soft. There is no mass.     Tenderness: There is no abdominal tenderness. There is no guarding or rebound.     Hernia: No hernia is present.  Musculoskeletal:        General: Normal range of motion.     Cervical back: Normal range of motion and neck supple.     Right lower leg: No edema.     Left lower  leg: No edema.  Lymphadenopathy:     Cervical: No cervical adenopathy.  Skin:    General: Skin is warm and dry.     Findings: No rash.  Neurological:     General: No focal deficit present.     Mental Status: He is alert and oriented to person, place, and time.  Psychiatric:        Mood and Affect: Mood normal.        Behavior: Behavior normal.        Thought Content: Thought content normal.        Judgment: Judgment normal.       Results for orders placed or performed in visit on 07/23/22  Lipid panel  Result Value Ref Range   Triglycerides 102 40 - 160   Cholesterol 164 0 - 200   HDL 47 35 - 70   LDL Cholesterol 97   Vitamin B12  Result Value Ref Range   Vitamin B-12 218   Hemoglobin A1c  Result Value Ref Range   Hemoglobin A1C 7.6   TSH  Result Value Ref Range   TSH 0.12 (A) 0.41 - 5.90    Assessment & Plan:   Problem List Items Addressed This Visit     Encounter for general adult medical examination with abnormal findings - Primary (Chronic)    Preventative protocols reviewed and updated unless pt declined. Discussed healthy diet and lifestyle.       Type 1 diabetes mellitus with complications [facility])    Seeing endocrinology, appreciate Dr Ladell Pier care.       Relevant Medications   insulin NPH Human (NOVOLIN N) 100 UNIT/ML injection   pravastatin (PRAVACHOL) 80 MG tablet   Hyperlipidemia due to type 1 diabetes mellitus (HCC)    Chronic, recent FLP reviewed. LDL 90s. Continue pravastatin '80mg'$  daily.  The 10-year ASCVD  risk score (Arnett DK, et al., 2019) is: 23.7%*   Values used to calculate the score:     Age: 86 years     Sex: Male     Is Non-Hispanic African American: No     Diabetic: Yes     Tobacco smoker: Yes     Systolic Blood Pressure: 035 mmHg     Is BP treated: Yes     HDL Cholesterol: 47 mg/dL*     Total Cholesterol: 164 mg/dL*     * - Cholesterol units were assumed for this score calculation       Relevant Medications   insulin NPH Human (NOVOLIN N) 100 UNIT/ML injection   pravastatin (PRAVACHOL) 80 MG tablet   apixaban (ELIQUIS) 5 MG TABS tablet   torsemide (DEMADEX) 20 MG tablet   TOBACCO ABUSE    He continues slowly cutting down, currently at 1/4 ppd - encouraged full cessation.       Relevant Orders   Ambulatory Referral for Lung Cancer Scre   Essential hypertension    Chronic, continues metoprolol '50mg'$  bid through cardiology as well as torsemide '20mg'$  daily.       Relevant Medications   pravastatin (PRAVACHOL) 80 MG tablet   apixaban (ELIQUIS) 5 MG TABS tablet   torsemide (DEMADEX) 20 MG tablet   GERD    Stable period on pantoprazole '40mg'$  daily.       Relevant Medications   pantoprazole (PROTONIX) 40 MG tablet   Abnormal thyroid function test    Recent TSH low - will update TSH, free T4, T3 and forward to endo.  Relevant Orders   TSH   T3   T4, free   Pedal edema    Stable on daily torsemide dose.      Macrocytic anemia    Update CBC with vitamin levels       Vitamin B12 deficiency    b12 shot today - rec monthly B12 shots x6 months then reassess control.       Vitamin D deficiency    Update levels off replacement.       Folate deficiency   Hyperparathyroidism ([place])   History of alcohol abuse    Abstinent since 06/2021 - congratulated!      Thiamine deficiency   AAA (abdominal aortic aneurysm) ([place])    Rpt abd [place] due 2024.       Relevant Medications   pravastatin (PRAVACHOL) 80 MG tablet   apixaban (ELIQUIS) 5 MG TABS tablet    torsemide (DEMADEX) 20 MG tablet   Atrial fibrillation with RVR (HCC)    Sees cardiology, on eliquis and metoprolol. Sounds regular today.       Relevant Medications   pravastatin (PRAVACHOL) 80 MG tablet   apixaban (ELIQUIS) 5 MG TABS tablet   torsemide (DEMADEX) 20 MG tablet   Other Visit Diagnoses     Special screening for malignant neoplasm of prostate       Need for shingles vaccine       Relevant Orders   Varicella-zoster vaccine IM (Completed)        Meds ordered this encounter  Medications   pantoprazole (PROTONIX) 40 MG tablet    Sig: Take 1 tablet (40 mg total) by mouth at bedtime.    Dispense:  90 tablet    Refill:  3   pravastatin (PRAVACHOL) 80 MG tablet    Sig: TAKE 1 TABLET(80 MG) BY MOUTH DAILY    Dispense:  90 tablet    Refill:  3   apixaban (ELIQUIS) 5 MG TABS tablet    Sig: TAKE 1 TABLET ('5MG'$  TOTAL) BY MOUTH 2 TIMES A DAY.    Dispense:  180 tablet    Refill:  3   torsemide (DEMADEX) 20 MG tablet    Sig: Take 1 tablet (20 mg total) by mouth daily.    Dispense:  90 tablet    Refill:  3   cyanocobalamin (VITAMIN B12) injection 1,000 mcg   Orders Placed This Encounter  Procedures   Varicella-zoster vaccine IM   TSH   T3   T4, free   Lipid panel    This external order was created through the Results Console.   Vitamin B12    This external order was created through the Results Console.   Hemoglobin A1c    This external order was created through the Results Console.   TSH    This external order was created through the Results Console.   Ambulatory Referral for Lung Cancer Scre    Referral Priority:   Routine    Referral Type:   Consultation    Referral Reason:   Specialty Services Required    Number of Visits Requested:   1    Patient instructions: Labs today  We will request records for diabetic eye exam [facility] in [place] B12 shot today then schedule monthly shots for 6 months and we will recheck levels after that.  Touch base  with general surgery about follow up plan for colonoscopy.  We will refer you to lung cancer screening program.  First shingrix shot today. Schedule nurse visit  in 2-6 months for 2nd shigrix shot.  Good to see you today Return as needed or in 6 months for follow up visit.   Follow up plan: Return in about 6 months (around 01/21/2023) for follow up visit.  Ria Bush, MD

## 2022-07-23 NOTE — Assessment & Plan Note (Addendum)
Chronic, recent FLP reviewed. LDL 90s. Continue pravastatin '80mg'$  daily.  The 10-year ASCVD risk score (Arnett DK, et al., 2019) is: 23.7%*   Values used to calculate the score:     Age: 61 years     Sex: Male     Is Non-Hispanic African American: No     Diabetic: Yes     Tobacco smoker: Yes     Systolic Blood Pressure: 409 mmHg     Is BP treated: Yes     HDL Cholesterol: 47 mg/dL*     Total Cholesterol: 164 mg/dL*     * - Cholesterol units were assumed for this score calculation

## 2022-07-23 NOTE — Assessment & Plan Note (Signed)
He continues slowly cutting down, currently at 1/4 ppd - encouraged full cessation.

## 2022-07-23 NOTE — Assessment & Plan Note (Signed)
Recent TSH low - will update TSH, free T4, T3 and forward to endo.

## 2022-07-23 NOTE — Assessment & Plan Note (Signed)
Stable on daily torsemide dose.

## 2022-07-23 NOTE — Assessment & Plan Note (Signed)
Seeing endocrinology, appreciate Dr Ladell Pier care.

## 2022-07-23 NOTE — Assessment & Plan Note (Signed)
Rpt abd [place] due 2024.

## 2022-07-23 NOTE — Assessment & Plan Note (Signed)
Preventative protocols reviewed and updated unless pt declined. Discussed healthy diet and lifestyle.  

## 2022-07-24 ENCOUNTER — Other Ambulatory Visit: Payer: Self-pay | Admitting: Family Medicine

## 2022-07-26 ENCOUNTER — Encounter: Payer: Self-pay | Admitting: Family Medicine

## 2022-07-26 NOTE — Telephone Encounter (Signed)
Opened in error

## 2022-07-27 LAB — T3: T3, Total: 126 ng/dL (ref 76–181)

## 2022-07-27 LAB — PARATHYROID HORMONE, INTACT (NO CA): PTH: 30 pg/mL (ref 16–77)

## 2022-07-27 LAB — VITAMIN B1: Vitamin B1 (Thiamine): 15 nmol/L (ref 8–30)

## 2022-08-13 ENCOUNTER — Inpatient Hospital Stay
Admission: EM | Admit: 2022-08-13 | Discharge: 2022-08-13 | DRG: 389 | Discharge status: Against Medical Advice | Payer: BC Managed Care – PPO | Attending: Internal Medicine | Admitting: Internal Medicine

## 2022-08-13 ENCOUNTER — Emergency Department: Payer: BC Managed Care – PPO

## 2022-08-13 ENCOUNTER — Inpatient Hospital Stay: Payer: BC Managed Care – PPO

## 2022-08-13 ENCOUNTER — Other Ambulatory Visit: Payer: Self-pay

## 2022-08-13 DIAGNOSIS — Z885 Allergy status to narcotic agent status: Secondary | ICD-10-CM | POA: Diagnosis not present

## 2022-08-13 DIAGNOSIS — K219 Gastro-esophageal reflux disease without esophagitis: Secondary | ICD-10-CM | POA: Diagnosis not present

## 2022-08-13 DIAGNOSIS — Z888 Allergy status to other drugs, medicaments and biological substances status: Secondary | ICD-10-CM

## 2022-08-13 DIAGNOSIS — I4891 Unspecified atrial fibrillation: Secondary | ICD-10-CM | POA: Diagnosis not present

## 2022-08-13 DIAGNOSIS — Z7901 Long term (current) use of anticoagulants: Secondary | ICD-10-CM | POA: Diagnosis not present

## 2022-08-13 DIAGNOSIS — Z833 Family history of diabetes mellitus: Secondary | ICD-10-CM

## 2022-08-13 DIAGNOSIS — E7800 Pure hypercholesterolemia, unspecified: Secondary | ICD-10-CM | POA: Diagnosis present

## 2022-08-13 DIAGNOSIS — D72829 Elevated white blood cell count, unspecified: Secondary | ICD-10-CM | POA: Diagnosis present

## 2022-08-13 DIAGNOSIS — Z4682 Encounter for fitting and adjustment of non-vascular catheter: Secondary | ICD-10-CM | POA: Diagnosis not present

## 2022-08-13 DIAGNOSIS — F17210 Nicotine dependence, cigarettes, uncomplicated: Secondary | ICD-10-CM | POA: Diagnosis present

## 2022-08-13 DIAGNOSIS — Z6826 Body mass index (BMI) 26.0-26.9, adult: Secondary | ICD-10-CM

## 2022-08-13 DIAGNOSIS — F419 Anxiety disorder, unspecified: Secondary | ICD-10-CM | POA: Diagnosis present

## 2022-08-13 DIAGNOSIS — Z8719 Personal history of other diseases of the digestive system: Secondary | ICD-10-CM | POA: Diagnosis present

## 2022-08-13 DIAGNOSIS — E440 Moderate protein-calorie malnutrition: Secondary | ICD-10-CM | POA: Diagnosis present

## 2022-08-13 DIAGNOSIS — F17200 Nicotine dependence, unspecified, uncomplicated: Secondary | ICD-10-CM | POA: Diagnosis present

## 2022-08-13 DIAGNOSIS — I10 Essential (primary) hypertension: Secondary | ICD-10-CM | POA: Diagnosis present

## 2022-08-13 DIAGNOSIS — E108 Type 1 diabetes mellitus with unspecified complications: Secondary | ICD-10-CM | POA: Diagnosis present

## 2022-08-13 DIAGNOSIS — R1030 Lower abdominal pain, unspecified: Secondary | ICD-10-CM | POA: Diagnosis not present

## 2022-08-13 DIAGNOSIS — R55 Syncope and collapse: Secondary | ICD-10-CM | POA: Diagnosis not present

## 2022-08-13 DIAGNOSIS — R109 Unspecified abdominal pain: Secondary | ICD-10-CM | POA: Diagnosis not present

## 2022-08-13 DIAGNOSIS — E1043 Type 1 diabetes mellitus with diabetic autonomic (poly)neuropathy: Secondary | ICD-10-CM | POA: Diagnosis not present

## 2022-08-13 DIAGNOSIS — K56609 Unspecified intestinal obstruction, unspecified as to partial versus complete obstruction: Secondary | ICD-10-CM | POA: Diagnosis not present

## 2022-08-13 DIAGNOSIS — Z79899 Other long term (current) drug therapy: Secondary | ICD-10-CM | POA: Diagnosis not present

## 2022-08-13 DIAGNOSIS — R1084 Generalized abdominal pain: Secondary | ICD-10-CM | POA: Diagnosis not present

## 2022-08-13 DIAGNOSIS — E1069 Type 1 diabetes mellitus with other specified complication: Secondary | ICD-10-CM | POA: Diagnosis present

## 2022-08-13 DIAGNOSIS — Z981 Arthrodesis status: Secondary | ICD-10-CM

## 2022-08-13 DIAGNOSIS — I482 Chronic atrial fibrillation, unspecified: Secondary | ICD-10-CM

## 2022-08-13 DIAGNOSIS — Z794 Long term (current) use of insulin: Secondary | ICD-10-CM | POA: Diagnosis not present

## 2022-08-13 DIAGNOSIS — Z8249 Family history of ischemic heart disease and other diseases of the circulatory system: Secondary | ICD-10-CM

## 2022-08-13 DIAGNOSIS — I700 Atherosclerosis of aorta: Secondary | ICD-10-CM | POA: Diagnosis not present

## 2022-08-13 DIAGNOSIS — Z9049 Acquired absence of other specified parts of digestive tract: Secondary | ICD-10-CM | POA: Diagnosis not present

## 2022-08-13 DIAGNOSIS — F1011 Alcohol abuse, in remission: Secondary | ICD-10-CM | POA: Diagnosis present

## 2022-08-13 DIAGNOSIS — K2970 Gastritis, unspecified, without bleeding: Secondary | ICD-10-CM | POA: Diagnosis not present

## 2022-08-13 LAB — CBC WITH DIFFERENTIAL/PLATELET
Abs Immature Granulocytes: 0.06 10*3/uL (ref 0.00–0.07)
Basophils Absolute: 0.1 10*3/uL (ref 0.0–0.1)
Basophils Relative: 0 %
Eosinophils Absolute: 0.2 10*3/uL (ref 0.0–0.5)
Eosinophils Relative: 1 %
HCT: 48 % (ref 39.0–52.0)
Hemoglobin: 16.2 g/dL (ref 13.0–17.0)
Immature Granulocytes: 0 %
Lymphocytes Relative: 12 %
Lymphs Abs: 1.9 10*3/uL (ref 0.7–4.0)
MCH: 30.4 pg (ref 26.0–34.0)
MCHC: 33.8 g/dL (ref 30.0–36.0)
MCV: 90.1 fL (ref 80.0–100.0)
Monocytes Absolute: 0.8 10*3/uL (ref 0.1–1.0)
Monocytes Relative: 5 %
Neutro Abs: 13.3 10*3/uL — ABNORMAL HIGH (ref 1.7–7.7)
Neutrophils Relative %: 82 %
Platelets: 375 10*3/uL (ref 150–400)
RBC: 5.33 MIL/uL (ref 4.22–5.81)
RDW: 13.4 % (ref 11.5–15.5)
WBC: 16.3 10*3/uL — ABNORMAL HIGH (ref 4.0–10.5)
nRBC: 0 % (ref 0.0–0.2)

## 2022-08-13 LAB — URINALYSIS, ROUTINE W REFLEX MICROSCOPIC
Bilirubin Urine: NEGATIVE
Glucose, UA: 150 mg/dL — AB
Hgb urine dipstick: NEGATIVE
Ketones, ur: 20 mg/dL — AB
Leukocytes,Ua: NEGATIVE
Nitrite: NEGATIVE
Protein, ur: NEGATIVE mg/dL
Specific Gravity, Urine: >1.046 — ABNORMAL HIGH (ref 1.005–1.030)
pH: 5 (ref 5.0–8.0)

## 2022-08-13 LAB — COMPREHENSIVE METABOLIC PANEL
ALT: 16 U/L (ref 0–44)
AST: 20 U/L (ref 15–41)
Albumin: 4.3 g/dL (ref 3.5–5.0)
Alkaline Phosphatase: 72 U/L (ref 38–126)
Anion gap: 12 (ref 5–15)
BUN: 21 mg/dL — ABNORMAL HIGH (ref 6–20)
CO2: 26 mmol/L (ref 22–32)
Calcium: 9.6 mg/dL (ref 8.9–10.3)
Chloride: 98 mmol/L (ref 98–111)
Creatinine, Ser: 1.12 mg/dL (ref 0.61–1.24)
GFR, Estimated: >60 mL/min (ref >60)
Glucose, Bld: 228 mg/dL — ABNORMAL HIGH (ref 70–99)
Potassium: 4.4 mmol/L (ref 3.5–5.1)
Sodium: 136 mmol/L (ref 135–145)
Total Bilirubin: 0.8 mg/dL (ref 0.3–1.2)
Total Protein: 8 g/dL (ref 6.5–8.1)

## 2022-08-13 LAB — LIPASE, BLOOD: Lipase: 21 U/L (ref 11–51)

## 2022-08-13 MED ORDER — LORAZEPAM 2 MG/ML IJ SOLN
1.0000 mg | Freq: Once | INTRAMUSCULAR | Status: AC
  Administered 2022-08-13: 1 mg via INTRAVENOUS
  Filled 2022-08-13: qty 1

## 2022-08-13 MED ORDER — MORPHINE SULFATE (PF) 4 MG/ML IV SOLN
4.0000 mg | Freq: Once | INTRAVENOUS | Status: DC
  Filled 2022-08-13: qty 1

## 2022-08-13 MED ORDER — INSULIN ASPART 100 UNIT/ML IJ SOLN: 0.0000 [IU] | Freq: Every day | INTRAMUSCULAR | Status: DC

## 2022-08-13 MED ORDER — MORPHINE SULFATE (PF) 2 MG/ML IV SOLN: 2.0000 mg | INTRAVENOUS | Status: DC | PRN

## 2022-08-13 MED ORDER — INSULIN ASPART 100 UNIT/ML IJ SOLN: 0.0000 [IU] | Freq: Three times a day (TID) | INTRAMUSCULAR | Status: DC

## 2022-08-13 MED ORDER — ONDANSETRON HCL 4 MG/2ML IJ SOLN
4.0000 mg | Freq: Once | INTRAMUSCULAR | Status: AC
  Administered 2022-08-13: 4 mg via INTRAVENOUS
  Filled 2022-08-13: qty 2

## 2022-08-13 MED ORDER — LIDOCAINE HCL (PF) 4 % IJ SOLN
4.0000 mL | Freq: Once | INTRAMUSCULAR | Status: AC
  Administered 2022-08-13: 4 mL
  Filled 2022-08-13: qty 5

## 2022-08-13 MED ORDER — FENTANYL CITRATE PF 50 MCG/ML IJ SOSY
75.0000 ug | PREFILLED_SYRINGE | Freq: Once | INTRAMUSCULAR | Status: AC
  Administered 2022-08-13: 75 ug via INTRAVENOUS
  Filled 2022-08-13: qty 2

## 2022-08-13 MED ORDER — METOPROLOL TARTRATE 5 MG/5ML IV SOLN: 5.0000 mg | Freq: Two times a day (BID) | INTRAVENOUS | Status: DC

## 2022-08-13 MED ORDER — ACETAMINOPHEN 325 MG PO TABS: 650.0000 mg | ORAL_TABLET | Freq: Four times a day (QID) | ORAL | Status: DC | PRN

## 2022-08-13 MED ORDER — FENTANYL CITRATE PF 50 MCG/ML IJ SOSY: 50.0000 ug | PREFILLED_SYRINGE | INTRAMUSCULAR | Status: DC | PRN

## 2022-08-13 MED ORDER — ACETAMINOPHEN 650 MG RE SUPP: 650.0000 mg | Freq: Four times a day (QID) | RECTAL | Status: DC | PRN

## 2022-08-13 MED ORDER — LIDOCAINE VISCOUS HCL 2 % MT SOLN
15.0000 mL | Freq: Once | OROMUCOSAL | Status: AC
  Administered 2022-08-13: 15 mL via OROMUCOSAL
  Filled 2022-08-13: qty 15

## 2022-08-13 MED ORDER — LORAZEPAM 2 MG/ML IJ SOLN: 1.0000 mg | Freq: Four times a day (QID) | INTRAMUSCULAR | Status: DC | PRN

## 2022-08-13 MED ORDER — IOHEXOL 300 MG/ML  SOLN
100.0000 mL | Freq: Once | INTRAMUSCULAR | Status: AC | PRN
  Administered 2022-08-13: 100 mL via INTRAVENOUS

## 2022-08-13 MED ORDER — ONDANSETRON HCL 4 MG/2ML IJ SOLN: 4.0000 mg | Freq: Four times a day (QID) | INTRAMUSCULAR | Status: DC | PRN

## 2022-08-13 MED ORDER — ONDANSETRON HCL 4 MG PO TABS: 4.0000 mg | ORAL_TABLET | Freq: Four times a day (QID) | ORAL | Status: DC | PRN

## 2022-08-13 NOTE — ED Triage Notes (Signed)
Pt arrived via [facility] with c/o generalized abd pain associated with N/V, denies diarrhea; onset this morning at 0800. Reports abd surgery one year ago "I had holes in my intestines."

## 2022-08-13 NOTE — Assessment & Plan Note (Signed)
-   Presumed secondary to bowel obstruction - CBC in a.m.

## 2022-08-13 NOTE — ED Triage Notes (Signed)
Ems report: pt ems from home for abd pain with N/V. Pt with hx abd surgery 1 year ago. Cbg 182

## 2022-08-13 NOTE — ED Notes (Signed)
Pt currently vomiting

## 2022-08-13 NOTE — ED Provider Notes (Signed)
[facility] Provider Note  Patient Contact: 5:04 PM (approximate)   History   Abdominal Pain   HPI  Russell Gomez is a 61 y.o. male with a history of perforated diverticulitis.  Patient had prior colectomy and end colostomy created on 06/14/2021 and underwent colostomy reversal in January 23.  Patient states that he developed pain around his colostomy scar starting at around 8 AM with multiple episodes of vomiting.  Patient states that pain intensity varies but he experiences pain constantly.  He states that he had 2 bowel movements today which were normal for him.  He has had some chills but denies fever.  No chest pain, chest tightness or shortness of breath.      Physical Exam   Triage Vital Signs: ED Triage Vitals  Enc Vitals Group     BP 08/13/22 1523 (!) 148/81     Pulse Rate 08/13/22 1523 80     Resp 08/13/22 1523 18     Temp 08/13/22 1523 98.6 F (37 C)     Temp Source 08/13/22 1523 Oral     SpO2 08/13/22 1523 100 %     Weight 08/13/22 1524 206 lb (93.4 kg)     Height 08/13/22 1524 '6\' 2"'$  (1.88 m)     Head Circumference --      Peak Flow --      Pain Score 08/13/22 1524 6     Pain Loc --      Pain Edu? --      Excl. in [place]? --     Most recent vital signs: Vitals:   08/13/22 1523  BP: (!) 148/81  Pulse: 80  Resp: 18  Temp: 98.6 F (37 C)  SpO2: 100%     General: Alert and in no acute distress. Eyes:  PERRL. EOMI. Head: No acute traumatic findings ENT:      Nose: No congestion/rhinnorhea.      Mouth/Throat: Mucous membranes are moist. Neck: No stridor. No cervical spine tenderness to palpation. Cardiovascular:  Good peripheral perfusion Respiratory: Normal respiratory effort without tachypnea or retractions. Lungs CTAB. Good air entry to the bases with no decreased or absent breath sounds. Gastrointestinal: Bowel sounds 4 quadrants. Soft and nontender to palpation. No guarding or rigidity. No palpable masses. No distention. No  CVA tenderness. Musculoskeletal: Full range of motion to all extremities.  Neurologic:  No gross focal neurologic deficits are appreciated.  Skin:   No rash noted Other:   ED Results / Procedures / Treatments   Labs (all labs ordered are listed, but only abnormal results are displayed) Labs Reviewed  COMPREHENSIVE METABOLIC PANEL - Abnormal; Notable for the following components:      Result Value   Glucose, Bld 228 (*)    BUN 21 (*)    All other components within normal limits  CBC WITH DIFFERENTIAL/PLATELET - Abnormal; Notable for the following components:   WBC 16.3 (*)    Neutro Abs 13.3 (*)    All other components within normal limits  URINALYSIS, ROUTINE W REFLEX MICROSCOPIC - Abnormal; Notable for the following components:   Color, Urine YELLOW (*)    APPearance CLEAR (*)    Specific Gravity, Urine >1.046 (*)    Glucose, UA 150 (*)    Ketones, ur 20 (*)    All other components within normal limits  LIPASE, BLOOD  BASIC METABOLIC PANEL  CBC  HEPARIN LEVEL (UNFRACTIONATED)       RADIOLOGY  I personally viewed  and evaluated these images as part of my medical decision making, as well as reviewing the written report by the radiologist.  ED Provider Interpretation: CT abdomen pelvis indicates ileus versus poss small bowel obstruction   PROCEDURES:  Critical Care performed: No  Procedures   MEDICATIONS ORDERED IN ED: Medications  acetaminophen (TYLENOL) tablet 650 mg (has no administration in time range)    Or  acetaminophen (TYLENOL) suppository 650 mg (has no administration in time range)  ondansetron (ZOFRAN) tablet 4 mg (has no administration in time range)    Or  ondansetron (ZOFRAN) injection 4 mg (has no administration in time range)  insulin aspart (novoLOG) injection 0-5 Units (has no administration in time range)  insulin aspart (novoLOG) injection 0-9 Units (has no administration in time range)  fentaNYL (SUBLIMAZE) injection 50 mcg (has no  administration in time range)  morphine (PF) 2 MG/ML injection 2 mg (has no administration in time range)  LORazepam (ATIVAN) injection 1 mg (has no administration in time range)  metoprolol tartrate (LOPRESSOR) injection 5 mg (has no administration in time range)  ondansetron (ZOFRAN) injection 4 mg (4 mg Intravenous Given 08/13/22 1534)  iohexol (OMNIPAQUE) 300 MG/ML solution 100 mL (100 mLs Intravenous Contrast Given 08/13/22 1606)  LORazepam (ATIVAN) injection 1 mg (1 mg Intravenous Given 08/13/22 1749)  lidocaine (PF) (XYLOCAINE) 4 % (PF) injection 4 mL (4 mLs Other Given 08/13/22 1805)  fentaNYL (SUBLIMAZE) injection 75 mcg (75 mcg Intravenous Given 08/13/22 1857)  lidocaine (XYLOCAINE) 2 % viscous mouth solution 15 mL (15 mLs Mouth/Throat Given 08/13/22 1857)     IMPRESSION / MDM / ASSESSMENT AND PLAN / ED COURSE  I reviewed the triage vital signs and the nursing notes.                              Assessment and plan:  Partial bowel obstruction versus ileus 61 year old male presents to the emergency department with abdominal pain near colostomy scar and multiple episodes of vomiting that started today.  Vital signs were reassuring at triage.  On exam, patient was alert but did seem uncomfortable.  Differential diagnosis included ileus, bowel obstruction, diverticulitis, intra-abdominal abscess...  White blood cell count was elevated at 16.3 with left shift.  CMP largely reassuring.  Lipase within range.  CT abdomen pelvis shows fluid-filled small bowel loops with questionable ileus versus small bowel obstruction.  I reached out to general surgeon on-call, Dr. Hampton Abbot who recommended NG tube placement and admission given concern for possible obstruction.   Patient was accepted to the hospitalist service under the care of of Dr. Tobie Poet.   FINAL CLINICAL IMPRESSION(S) / ED DIAGNOSES   Final diagnoses:  Lower abdominal pain     Rx / DC Orders   ED Discharge Orders     None         Note:  This document was prepared using Dragon voice recognition software and may include unintentional dictation errors.   Vallarie Mare [place], PA-C 08/13/22 2213    Naaman Plummer, MD 08/16/22 [phone]

## 2022-08-13 NOTE — Assessment & Plan Note (Addendum)
-   Patient has history of bowel resection with colostomy bag and revision in 2022 - General surgery has been consulted and recommended NG tube for decompression -Morphine 2 mg IV every 4 hours.  For moderate pain, 4 doses ordered; fentanyl 50 mcg IV every 3 hours as needed for severe pain, 3 doses ordered - Ativan 1 mg IV every 6 hours.  For anxiety, 3 doses - NG tube was attempted by nursing staff - Patient intolerable of procedure - Ondansetron as needed ordered nausea and vomiting - Admit to telemetry cardiac, and - I counseled patient at bedside stating that I will consult IR for sedation and NG tube placement - Patient declined stating that he will leave AMA
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
## 2022-08-13 NOTE — Assessment & Plan Note (Addendum)
-   Holding home Eliquis at this time due to possibility of surgery - Heparin per pharmacy for atrial fibrillation ordered - Patient takes metoprolol tartrate 50 mg p.o. twice daily, this has been held due to intolerance of p.o. intake - Metoprolol 5 mg IV twice daily initiated, this can be increased to 10 mg IV twice daily if needed

## 2022-08-13 NOTE — ED Notes (Signed)
Pt signed AMA paper work, placed in paper chart.

## 2022-08-13 NOTE — Assessment & Plan Note (Signed)
-   Patient states he has quit drinking alcohol

## 2022-08-13 NOTE — Assessment & Plan Note (Signed)
-   Insulin SSI with at bedtime coverage ordered °

## 2022-08-13 NOTE — Hospital Course (Signed)
[name] is a 61 year old male with history of atrial fibrillation on Eliquis, insulin-dependent diabetes mellitus, hypertension, hyperlipidemia, GERD, who presents emergency department for chief concerns of nausea and vomiting.  Initial vitals in the emergency department showed temperature of 98.6, respiration rate of 18, heart rate of 80, blood pressure 148/81, SPO2 100% on room air.  Serum sodium is 136, potassium 4.4, chloride 98, bicarb 26, BUN of 21, serum creatinine 1.12, GFR greater than 60, nonfasting blood glucose 228, WBC elevated at 16.3, hemoglobin 16.2, platelets of 375.  Lipase was 21.  CT abdomen pelvis with contrast: Read as multiple dilated fluid filled loops of bowel.  Suggested areas of mild wall thickening of pelvic small loops of bowel.  Findings could be secondary to ileus/enteritis though partial bowel is also considered. Small volume free fluid in the pelvis but no definitive free air.  Small hiatal hernia.    ED treatment: Lorazepam 1 mg IV, ondansetron 4 mg IV, morphine feeding

## 2022-08-13 NOTE — Assessment & Plan Note (Signed)
-   Patient takes metoprolol tartrate 25 mg p.o. twice daily - Converted to IV: Metoprolol tartrate 5 mg IV twice daily

## 2022-08-13 NOTE — ED Provider Triage Note (Signed)
  Emergency Medicine Provider Triage Evaluation Note  Russell Gomez , a 61 y.o.male,  was evaluated in triage.  Pt complains of sudden onset abdominal pain that started this morning.  He states that it is centralized, 6/10, around the area where he had surgery on his abdomen last year.  He states that he had surgery for "holes in in his intestines".  Endorses some vomiting and diarrhea as well.   Review of Systems  Positive: Abdominal pain, vomiting, diarrhea Negative: Denies dizziness, chest pain, fever  Physical Exam  There were no vitals filed for this visit. Gen:   Awake, appears uncomfortable Resp:  Normal effort  MSK:   Moves extremities without difficulty  Other:  Tenderness appreciated in the central aspect of the abdomen.  Distention noted.  Medical Decision Making  Given the patient's initial medical screening exam, the following diagnostic evaluation has been ordered. The patient will be placed in the appropriate treatment space, once one is available, to complete the evaluation and treatment. I have discussed the plan of care with the patient and I have advised the patient that an ED physician or mid-level practitioner will reevaluate their condition after the test results have been received, as the results may give them additional insight into the type of treatment they may need.    Diagnostics: Labs, abdominal CT, UA  Treatments: none immediately   Teodoro Spray, [place] 08/13/22 1447
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
## 2022-08-16 ENCOUNTER — Other Ambulatory Visit: Payer: Self-pay

## 2022-08-16 ENCOUNTER — Telehealth: Payer: Self-pay

## 2022-08-16 ENCOUNTER — Emergency Department: Payer: BC Managed Care – PPO

## 2022-08-16 ENCOUNTER — Emergency Department
Admission: EM | Admit: 2022-08-16 | Discharge: 2022-08-16 | Disposition: A | Discharge status: Home / Self Care | Payer: BC Managed Care – PPO | Attending: Student in an Organized Health Care Education/Training Program | Admitting: Student in an Organized Health Care Education/Training Program

## 2022-08-16 ENCOUNTER — Encounter: Payer: Self-pay | Admitting: Intensive Care

## 2022-08-16 DIAGNOSIS — E1165 Type 2 diabetes mellitus with hyperglycemia: Secondary | ICD-10-CM | POA: Insufficient documentation

## 2022-08-16 DIAGNOSIS — R1084 Generalized abdominal pain: Secondary | ICD-10-CM | POA: Diagnosis not present

## 2022-08-16 DIAGNOSIS — K5900 Constipation, unspecified: Secondary | ICD-10-CM | POA: Diagnosis not present

## 2022-08-16 DIAGNOSIS — I700 Atherosclerosis of aorta: Secondary | ICD-10-CM | POA: Diagnosis not present

## 2022-08-16 DIAGNOSIS — R1032 Left lower quadrant pain: Secondary | ICD-10-CM | POA: Diagnosis not present

## 2022-08-16 DIAGNOSIS — R109 Unspecified abdominal pain: Secondary | ICD-10-CM | POA: Diagnosis not present

## 2022-08-16 DIAGNOSIS — R739 Hyperglycemia, unspecified: Secondary | ICD-10-CM

## 2022-08-16 LAB — CBC WITH DIFFERENTIAL/PLATELET
Abs Immature Granulocytes: 0.01 10*3/uL (ref 0.00–0.07)
Basophils Absolute: 0 10*3/uL (ref 0.0–0.1)
Basophils Relative: 1 %
Eosinophils Absolute: 0.5 10*3/uL (ref 0.0–0.5)
Eosinophils Relative: 6 %
HCT: 41.9 % (ref 39.0–52.0)
Hemoglobin: 13.9 g/dL (ref 13.0–17.0)
Immature Granulocytes: 0 %
Lymphocytes Relative: 28 %
Lymphs Abs: 2.3 10*3/uL (ref 0.7–4.0)
MCH: 30.2 pg (ref 26.0–34.0)
MCHC: 33.2 g/dL (ref 30.0–36.0)
MCV: 91.1 fL (ref 80.0–100.0)
Monocytes Absolute: 0.5 10*3/uL (ref 0.1–1.0)
Monocytes Relative: 6 %
Neutro Abs: 4.9 10*3/uL (ref 1.7–7.7)
Neutrophils Relative %: 59 %
Platelets: 303 10*3/uL (ref 150–400)
RBC: 4.6 MIL/uL (ref 4.22–5.81)
RDW: 13.2 % (ref 11.5–15.5)
WBC: 8.2 10*3/uL (ref 4.0–10.5)
nRBC: 0 % (ref 0.0–0.2)

## 2022-08-16 LAB — URINALYSIS, ROUTINE W REFLEX MICROSCOPIC
Bacteria, UA: NONE SEEN
Bilirubin Urine: NEGATIVE
Glucose, UA: >=500 mg/dL — AB
Hgb urine dipstick: NEGATIVE
Ketones, ur: NEGATIVE mg/dL
Leukocytes,Ua: NEGATIVE
Nitrite: NEGATIVE
Protein, ur: NEGATIVE mg/dL
Specific Gravity, Urine: 1.024 (ref 1.005–1.030)
Squamous Epithelial / HPF: NONE SEEN (ref 0–5)
pH: 6 (ref 5.0–8.0)

## 2022-08-16 LAB — COMPREHENSIVE METABOLIC PANEL
ALT: 14 U/L (ref 0–44)
AST: 15 U/L (ref 15–41)
Albumin: 3.5 g/dL (ref 3.5–5.0)
Alkaline Phosphatase: 83 U/L (ref 38–126)
Anion gap: 6 (ref 5–15)
BUN: 22 mg/dL — ABNORMAL HIGH (ref 6–20)
CO2: 28 mmol/L (ref 22–32)
Calcium: 8.6 mg/dL — ABNORMAL LOW (ref 8.9–10.3)
Chloride: 100 mmol/L (ref 98–111)
Creatinine, Ser: 1.27 mg/dL — ABNORMAL HIGH (ref 0.61–1.24)
GFR, Estimated: >60 mL/min (ref >60)
Glucose, Bld: 466 mg/dL — ABNORMAL HIGH (ref 70–99)
Potassium: 4.5 mmol/L (ref 3.5–5.1)
Sodium: 134 mmol/L — ABNORMAL LOW (ref 135–145)
Total Bilirubin: 0.4 mg/dL (ref 0.3–1.2)
Total Protein: 6.5 g/dL (ref 6.5–8.1)

## 2022-08-16 LAB — LIPASE, BLOOD: Lipase: 28 U/L (ref 11–51)

## 2022-08-16 MED ORDER — IOHEXOL 300 MG/ML  SOLN
100.0000 mL | Freq: Once | INTRAMUSCULAR | Status: AC | PRN
  Administered 2022-08-16: 100 mL via INTRAVENOUS

## 2022-08-16 NOTE — Discharge Instructions (Signed)
Please continue with current bowel regimen.  Follow-up with PCP.  Return to the ER for any abdominal pain worsening symptoms or any urgent changes in your health.  Please take insulin for diabetes as prescribed

## 2022-08-16 NOTE — ED Provider Triage Note (Signed)
  Emergency Medicine Provider Triage Evaluation Note  Russell Gomez , a 61 y.o.male,  was evaluated in triage.  Pt complains of lower left abdominal discomfort.  He states that he was recently seen here on 08/13/2022, which showed a partial bowel obstruction.  He states that overall the pain has gotten better, but he still has persistent discomfort over the past couple days he has not passed any gas.  He spoke to his general surgeon, who told him to report to the emergency department for another CT scan.   Review of Systems  Positive: Abdominal discomfort, constipation. Negative: Denies fever, chest pain, vomiting  Physical Exam  There were no vitals filed for this visit. Gen:   Awake, no distress   Resp:  Normal effort  MSK:   Moves extremities without difficulty  Other:    Medical Decision Making  Given the patient's initial medical screening exam, the following diagnostic evaluation has been ordered. The patient will be placed in the appropriate treatment space, once one is available, to complete the evaluation and treatment. I have discussed the plan of care with the patient and I have advised the patient that an ED physician or mid-level practitioner will reevaluate their condition after the test results have been received, as the results may give them additional insight into the type of treatment they may need.    Diagnostics: Labs, abdominal CT  Treatments: none immediately   Teodoro Spray, [place] 08/16/22 1609

## 2022-08-16 NOTE — Telephone Encounter (Signed)
Transition Care Management Follow-up Telephone Call Date of discharge and from where: ED visit at [facility]  How have you been since you were released from the hospital? 08/13/2022 Any questions or concerns? No - left AMA   Items Reviewed: Did the pt receive and understand the discharge instructions provided?  Left AMA  Medications obtained and verified? No  Other? No  Any new allergies since your discharge? No  Dietary orders reviewed? No Do you have support at home? Yes   Home Care and Equipment/Supplies: Were home health services ordered? no If so, what is the name of the agency? N/A  Has the agency set up a time to come to the patient's home? no Were any new equipment or medical supplies ordered?  No What is the name of the medical supply agency? N/A Were you able to get the supplies/equipment? not applicable Do you have any questions related to the use of the equipment or supplies? No  Functional Questionnaire: (I = Independent and D = Dependent) ADLs:     Bathing/Dressing- I  Meal Prep- I  Eating- I  Maintaining continence- I  Transferring/Ambulation- I  Managing Meds- I  Follow up appointments reviewed:  PCP Hospital f/u appt confirmed? No  Not sure when he need to follow up [facility] f/u appt confirmed? Yes  Scheduled to see Surgery  on 08/17/2022 @ 10:15. Are transportation arrangements needed? No  If their condition worsens, is the pt aware to call PCP or go to the Emergency Dept.? Yes Was the patient provided with contact information for the PCP's office or ED? Yes Was to pt encouraged to call back with questions or concerns? Yes   Patient left AMA. Was not given any discharge information. Would like to know when he should be seen by Dr. Danise Mina. Has appointment tomorrow  with surgeon for follow up.

## 2022-08-16 NOTE — ED Provider Notes (Signed)
[facility] EMERGENCY DEPARTMENT Provider Note   CSN: [id number] Arrival date & time: 08/16/22  1538     History  Chief Complaint  Patient presents with   Abdominal Pain    Russell Gomez is a 61 y.o. male.  Presents to the emergency department for evaluation of bowel obstruction.  Patient was seen 08/13/2022 and had small blockage.  He elected not to stay in the hospital for admission and went home and has been only strict bowel regimen.  Patient states he has had some bowel movements, had a large soft bowel movement today.  He is without abdominal pain/distention.  He denies any nausea or vomiting.  Tolerating p.o. well.   HPI     Home Medications Prior to Admission medications   Medication Sig Start Date End Date Taking? Authorizing Provider  apixaban (ELIQUIS) 5 MG TABS tablet TAKE 1 TABLET ('5MG'$  TOTAL) BY MOUTH 2 TIMES A DAY. 07/23/22   Ria Bush, MD  cyanocobalamin (,VITAMIN B-12,) 1000 MCG/ML injection Inject 1 mL (1,000 mcg total) into the muscle every 30 (thirty) days. 01/11/22   Ria Bush, MD  insulin NPH Human (NOVOLIN N) 100 UNIT/ML injection Inject 0.13 mLs (13 Units total) into the skin 2 (two) times daily before a meal. 07/23/22   Ria Bush, MD  insulin regular (NOVOLIN R) 100 units/mL injection Inject 2-15 Units into the skin 3 (three) times daily before meals.    [provider]  magnesium oxide (MAG-OX) 400 (240 Mg) MG tablet Take 1 tablet (400 mg total) by mouth daily. 04/21/22   Ria Bush, MD  metoprolol tartrate (LOPRESSOR) 50 MG tablet Take 1 tablet (50 mg total) by mouth 2 (two) times daily. 11/23/21 08/13/22  Rise Mu, PA-C  pantoprazole (PROTONIX) 40 MG tablet Take 1 tablet (40 mg total) by mouth at bedtime. 07/23/22   Ria Bush, MD  pravastatin (PRAVACHOL) 80 MG tablet TAKE 1 TABLET(80 MG) BY MOUTH DAILY 07/23/22   Ria Bush, MD  torsemide (DEMADEX) 20 MG tablet Take 1 tablet (20 mg  total) by mouth daily. 07/23/22   Ria Bush, MD      Allergies    Insulin aspart, Spironolactone, Hydrochlorothiazide, and Morphine    Review of Systems   Review of Systems  Physical Exam Updated Vital Signs BP 137/71 (BP Location: Right Arm)   Pulse 69   Temp 97.7 F (36.5 C) (Oral)   Resp 18   Ht '6\' 2"'$  (1.88 m)   Wt 93.4 kg   SpO2 98%   BMI 26.45 kg/m  Physical Exam Constitutional:      Appearance: He is well-developed.  HENT:     Head: Normocephalic and atraumatic.  Eyes:     Conjunctiva/sclera: Conjunctivae normal.  Cardiovascular:     Rate and Rhythm: Normal rate.  Pulmonary:     Effort: Pulmonary effort is normal. No respiratory distress.  Abdominal:     General: Bowel sounds are normal. There is no distension.     Palpations: Abdomen is soft.     Tenderness: There is no abdominal tenderness. There is no right CVA tenderness, left CVA tenderness, guarding or rebound.     Hernia: No hernia is present.  Musculoskeletal:        General: Normal range of motion.     Cervical back: Normal range of motion.  Skin:    General: Skin is warm.     Findings: No rash.  Neurological:     General: No focal deficit  present.     Mental Status: He is alert and oriented to person, place, and time. Mental status is at baseline.  Psychiatric:        Behavior: Behavior normal.        Thought Content: Thought content normal.     ED Results / Procedures / Treatments   Labs (all labs ordered are listed, but only abnormal results are displayed) Labs Reviewed  COMPREHENSIVE METABOLIC PANEL - Abnormal; Notable for the following components:      Result Value   Sodium 134 (*)    Glucose, Bld 466 (*)    BUN 22 (*)    Creatinine, Ser 1.27 (*)    Calcium 8.6 (*)    All other components within normal limits  URINALYSIS, ROUTINE W REFLEX MICROSCOPIC - Abnormal; Notable for the following components:   Color, Urine STRAW (*)    APPearance CLEAR (*)    Glucose, UA >=500 (*)     All other components within normal limits  CBC WITH DIFFERENTIAL/PLATELET  LIPASE, BLOOD    EKG None  Radiology CT Abdomen Pelvis W Contrast  Result Date: 08/16/2022 CLINICAL DATA:  Abdominal pain, postop left lower quadrant abdominal pain. EXAM: CT ABDOMEN AND PELVIS WITH CONTRAST TECHNIQUE: Multidetector CT imaging of the abdomen and pelvis was performed using the standard protocol following bolus administration of intravenous contrast. RADIATION DOSE REDUCTION: This exam was performed according to the departmental dose-optimization program which includes automated exposure control, adjustment of the mA and/or kV according to patient size and/or use of iterative reconstruction technique. CONTRAST:  127m OMNIPAQUE IOHEXOL 300 MG/ML  SOLN COMPARISON:  CT examination dated August 13, 2022 FINDINGS: Lower chest: No acute abnormality. Hepatobiliary: No focal liver abnormality is seen. No gallstones, gallbladder wall thickening, or biliary dilatation. Pancreas: Generalized pancreatic atrophy. No pancreatic ductal dilatation or surrounding inflammatory changes. Spleen: Normal in size without focal abnormality. Adrenals/Urinary Tract: Adrenal glands are unremarkable. Kidneys are normal, without renal calculi, focal lesion, or hydronephrosis. Bladder is unremarkable. Stomach/Bowel: Stomach is within normal limits. Appendix not identified. Interval significant improvement in the multiple dilated small bowel loops. Small mildly distended small bowel loops in the right lower quadrant without evidence of obstruction. Colonic loops are normal in caliber. No evidence of colitis or diverticulitis. No evidence of bowel wall thickening, distention, or inflammatory changes. Vascular/Lymphatic: Moderate-to-severe aortic atherosclerosis. No enlarged abdominal or pelvic lymph nodes. Reproductive: Prostate is unremarkable. Other: No abdominal wall hernia or abnormality. No abdominopelvic ascites. Musculoskeletal: Advanced  degenerate disc disease of the lumbar spine. No acute osseous abnormality. IMPRESSION: 1. Interval significant improvement in the multiple dilated small bowel loops. Short segment mildly distended small bowel loops in the right lower quadrant without evidence of obstruction. Colonic loops are normal in caliber. 2. No evidence of colitis or diverticulitis. 3. Moderate-to-severe aortic atherosclerosis. 4. Advanced degenerate disc disease of the lumbar spine. Aortic Atherosclerosis (ICD10-I70.0). Electronically Signed   By: IKeane PoliceD.O.   On: 08/16/2022 17:55    Procedures Procedures    Medications Ordered in ED Medications  iohexol (OMNIPAQUE) 300 MG/ML solution 100 mL (100 mLs Intravenous Contrast Given 08/16/22 1738)    ED Course/ Medical Decision Making/ A&P                           Medical Decision Making  61year old male presents to the ED for concern for small bowel obstruction.  Patient had CT scan today showing significant improvement in dilated  small bowel loops that was seen on previous CT scan several days ago.  He is without abdominal pain on exam today.  No distention.  He is having good bowel movements.  Blood work is within normal limits, vital signs are stable.  Patient noted to be hyperglycemic but has not had his insulin while being here in the ED today.  Patient will take his diabetic medications once he gets home.  He will continue with current bowel regimen.  Follow-up with PCP. Final Clinical Impression(s) / ED Diagnoses Final diagnoses:  Generalized abdominal pain  Constipation, unspecified constipation type  Hyperglycemia    Rx / DC Orders ED Discharge Orders     None         Renata Caprice 08/16/22 2215    Merlyn Lot, MD 08/16/22 2348

## 2022-08-16 NOTE — ED Triage Notes (Signed)
Patient reports being seen on 08/13/22 for abdominal pain with N/V and told he had small blockage. MDs wanted to give patient NG tube and admit him and he refused treatment and left. Patient reports he is back today with no abdominal pain and wanting to see if he still has a blockage. Reports he will agree to treatment today if given pain medication before NG tube if needed.

## 2022-08-17 NOTE — Telephone Encounter (Signed)
Called patient to review. Wife answered patient not there. Patient does not have dpr on file asked to have patient call office

## 2022-08-17 NOTE — Telephone Encounter (Addendum)
I saw reassuring evaluation last night at ER.  Recommend OV within 2 wks from hospital discharge unless gen surgery thinks he's doing great and doesn't need PCP f/u.

## 2022-08-17 NOTE — Telephone Encounter (Signed)
Called patient would like to hold off on follow up with PCP at this time. Is feeling a lot better. If any new changes will let us know.

## 2022-08-20 ENCOUNTER — Ambulatory Visit (INDEPENDENT_AMBULATORY_CARE_PROVIDER_SITE_OTHER): Payer: BC Managed Care – PPO | Admitting: Family Medicine

## 2022-08-20 ENCOUNTER — Ambulatory Visit: Payer: BC Managed Care – PPO

## 2022-08-20 ENCOUNTER — Encounter: Payer: Self-pay | Admitting: Family Medicine

## 2022-08-20 VITALS — BP 124/62 | HR 52 | Temp 97.3°F | Ht 74.0 in | Wt 216.4 lb

## 2022-08-20 DIAGNOSIS — K56609 Unspecified intestinal obstruction, unspecified as to partial versus complete obstruction: Secondary | ICD-10-CM | POA: Diagnosis not present

## 2022-08-20 DIAGNOSIS — E538 Deficiency of other specified B group vitamins: Secondary | ICD-10-CM

## 2022-08-20 DIAGNOSIS — E108 Type 1 diabetes mellitus with unspecified complications: Secondary | ICD-10-CM | POA: Diagnosis not present

## 2022-08-20 MED ORDER — CYANOCOBALAMIN 1000 MCG/ML IJ SOLN
1000.0000 ug | Freq: Once | INTRAMUSCULAR | Status: AC
  Administered 2022-08-20: 1000 ug via INTRAMUSCULAR
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
## 2022-08-20 NOTE — Patient Instructions (Signed)
I'm glad you're feeling better.  Clear liquids, bland food until fully better then start high fiber diet to hopefully prevent recurrence.  Keep March appointment.   Bowel Obstruction A bowel obstruction is a blockage in the small or large bowel. The bowel is also called the intestine. It is a long tube that connects the stomach to the anus. When a person eats and drinks, food and fluids go from the mouth to the stomach to the small bowel. This is where most of the nutrients in the food and fluids are absorbed. After the small bowel, material passes through the large bowel for further absorption until any leftover material leaves the body as stool (feces) through the anus during a bowel movement. A bowel obstruction will prevent food and fluids from passing through the bowel as they normally do during digestion. The bowel can become partially or completely blocked. If this condition is not treated, it can be dangerous because the bowel could rupture. What are the causes? Common causes of this condition include: Scar tissue in the body (adhesions) from previous surgery or treatment with high-energy X-rays (radiation). Recent surgery. This may cause the movements of the bowel to slow down and cause food to block the intestine. Inflammatory bowel disease, such as Crohn's disease or diverticulitis. Growths or tumors. A bulging organ or tissue (hernia). Twisting of the bowel (volvulus). A swallowed object (foreign body). Slipping of a part of the bowel into another part (intussusception). What are the signs or symptoms? Symptoms of this condition include: Pain in the abdomen. Depending on the degree of obstruction, pain may be: Mild or severe. Dull cramping or sharp pain. In one area or in the entire abdomen. Nausea and vomiting. Vomit may be greenish or a yellow bile color. Bloating in the abdomen. Constipation. Being unable to pass gas. Frequent belching. Diarrhea. This may occur if the  obstruction is partial and runny stool is able to leak around the obstruction. How is this diagnosed? This condition may be diagnosed based on: A physical exam. Your medical history. Exams to look into the small intestine or the large intestine (endoscopy or colonoscopy). Imaging tests of the abdomen or pelvis, such as X-ray or CT scan. Blood or urine tests. How is this treated? Treatment for this condition depends on the cause and severity of the problem. Treatment may include: Fluids and pain medicines that are given through an IV. Your health care provider may tell you not to eat or drink if you have nausea or vomiting. A clear liquid diet. You may be asked to consume a clear liquid diet for several days. This allows the bowel to rest. Placement of a small tube (nasogastric tube) through the nose, down the throat, and into the stomach. This may relieve pain, discomfort, and nausea by removing blocked air and fluids from the stomach. It can also help the obstruction clear up faster. Surgery. This may be required if other treatments do not work. Surgery may be required for: Bowel obstruction from a hernia. This can be an emergency procedure. Scar tissue that causes frequent or severe obstructions. Follow these instructions at home: Medicines Take over-the-counter and prescription medicines only as told by your health care provider. If you were prescribed an antibiotic medicine, take it as told by your health care provider. Do not stop taking the antibiotic even if you start to feel better. General instructions Follow instructions from your health care provider about eating and drinking restrictions. You may need to avoid solid foods  and drink only clear liquids until your condition improves. Return to your normal activities as told by your health care provider. Ask your health care provider what activities are safe for you. Rest as told by your health care provider. Avoid sitting for a long  time without moving. Get up to take short walks every 1-2 hours. This is important to improve blood flow and breathing. Ask for help if you feel weak or unsteady. Keep all follow-up visits. This is important. How is this prevented? After having a bowel obstruction, you are more likely to have another. You may do the following things to prevent another obstruction: If you have a long-term (chronic) disease, pay attention to your symptoms and contact your health care provider if you have questions or concerns. Avoid becoming constipated. You may need to take these actions to prevent or treat constipation: Drink enough fluid to keep your urine pale yellow. Take over-the-counter or prescription medicines. Eat foods that are high in fiber, such as beans, whole grains, and fresh fruits and vegetables. Limit foods that are high in fat and processed sugars, such as fried or sweet foods. Stay active. Exercise for 30 minutes or more, 5 or more days each week. Ask your health care provider which exercises are safe for you. Avoid stress. Find ways to reduce stress, such as meditation, exercise, or taking time for activities that relax you. Instead of eating three large meals each day, eat three small meals with three small snacks. Work with a Microbiologist to make a healthy meal plan that works for you. Do not use any products that contain nicotine or tobacco. These products include cigarettes, chewing tobacco, and vaping devices, such as e-cigarettes. If you need help quitting, ask your health care provider. Contact a health care provider if: You have a fever. You have chills. Get help right away if: You have increased pain or cramping. You vomit blood. You have uncontrolled vomiting or nausea. You cannot drink fluids because of vomiting or pain. You become confused. You begin feeling very thirsty (dehydrated). You have severe bloating. You feel extremely weak or you faint. Summary A bowel obstruction  is a blockage in the small or large bowel. A bowel obstruction will prevent food and fluids from passing through the bowel as they normally do during digestion. Treatment for this condition depends on the cause and severity of the problem. It may include fluids and pain medicines through an IV, a simple diet, a nasogastric tube, or surgery. Follow instructions from your health care provider about eating restrictions. You may need to avoid solid foods and consume only clear liquids until your condition improves. This information is not intended to replace advice given to you by your health care provider. Make sure you discuss any questions you have with your health care provider. Document Revised: 12/07/2020 Document Reviewed: 12/07/2020 Elsevier Patient Education  [place].

## 2022-08-20 NOTE — Assessment & Plan Note (Signed)
Recently elevated sugars in setting of missed insulin doses.  Discussed continuous glucose monitor - he states he's had trouble getting insurance coverage for this. He has upcoming endo appt - advised let us know if ongoing difficulty to work with our pharmacist regarding this.

## 2022-08-20 NOTE — Assessment & Plan Note (Signed)
b12 shot today. 

## 2022-08-25 ENCOUNTER — Ambulatory Visit: Payer: BC Managed Care – PPO

## 2022-09-12 ENCOUNTER — Other Ambulatory Visit: Payer: Self-pay | Admitting: Family Medicine

## 2022-09-15 ENCOUNTER — Telehealth: Payer: Self-pay | Admitting: Family Medicine

## 2022-09-15 NOTE — Telephone Encounter (Signed)
Too soon.  Rx sent on 07/23/22, #90/3 to [facility] [address]

## 2022-09-17 NOTE — Telephone Encounter (Signed)
Patient called in and stated that he is down to 3 pills. He only received a 30 day supply when he got his last refill.

## 2022-09-17 NOTE — Telephone Encounter (Signed)
Spoke with pt notifying him rx sent on 07/23/22, #90 with 3 additional refills to [facility] [address]   Pt states he only got #30 tabs.  I spoke with [facility] asking if 07/23/22 rx was received.  Says pt called in last refill from old rx #.  They will go ahead and fill new rx.  I notified pt new rx is being filled and in the future, call refill from newest bottle.  Verbalizes understanding and expresses his thanks.

## 2022-09-22 ENCOUNTER — Ambulatory Visit: Payer: BC Managed Care – PPO

## 2022-10-06 ENCOUNTER — Ambulatory Visit (INDEPENDENT_AMBULATORY_CARE_PROVIDER_SITE_OTHER): Payer: BC Managed Care – PPO

## 2022-10-06 DIAGNOSIS — E538 Deficiency of other specified B group vitamins: Secondary | ICD-10-CM | POA: Diagnosis not present

## 2022-10-06 MED ORDER — CYANOCOBALAMIN 1000 MCG/ML IJ SOLN
1000.0000 ug | Freq: Once | INTRAMUSCULAR | Status: AC
  Administered 2022-10-06: 1000 ug via INTRAMUSCULAR

## 2022-10-06 NOTE — Progress Notes (Signed)
Per orders of Dr. Danise Mina, injection of Vitamin B 12 given in right deltoid given by Ozzie Hoyle. Patient tolerated injection well.

## 2022-10-14 DIAGNOSIS — A630 Anogenital (venereal) warts: Secondary | ICD-10-CM | POA: Diagnosis not present

## 2022-10-14 DIAGNOSIS — R238 Other skin changes: Secondary | ICD-10-CM | POA: Diagnosis not present

## 2022-10-14 DIAGNOSIS — L0889 Other specified local infections of the skin and subcutaneous tissue: Secondary | ICD-10-CM | POA: Diagnosis not present

## 2022-10-22 ENCOUNTER — Ambulatory Visit: Payer: BC Managed Care – PPO

## 2022-11-17 DIAGNOSIS — R112 Nausea with vomiting, unspecified: Secondary | ICD-10-CM | POA: Diagnosis not present

## 2022-11-17 DIAGNOSIS — R509 Fever, unspecified: Secondary | ICD-10-CM | POA: Diagnosis not present

## 2022-11-17 DIAGNOSIS — U071 COVID-19: Secondary | ICD-10-CM | POA: Diagnosis not present

## 2022-11-23 ENCOUNTER — Ambulatory Visit: Payer: BC Managed Care – PPO

## 2022-11-26 ENCOUNTER — Other Ambulatory Visit: Payer: Self-pay

## 2022-11-26 MED ORDER — MAGNESIUM OXIDE -MG SUPPLEMENT 400 (240 MG) MG PO TABS: 400.0000 mg | ORAL_TABLET | Freq: Every day | ORAL | 2 refills | Status: DC

## 2022-12-28 ENCOUNTER — Encounter: Payer: Self-pay | Admitting: Family Medicine

## 2022-12-28 ENCOUNTER — Ambulatory Visit (INDEPENDENT_AMBULATORY_CARE_PROVIDER_SITE_OTHER): Payer: BC Managed Care – PPO | Admitting: Family Medicine

## 2022-12-28 VITALS — BP 130/70 | HR 69 | Temp 97.3°F | Ht 74.0 in | Wt 205.5 lb

## 2022-12-28 DIAGNOSIS — K1370 Unspecified lesions of oral mucosa: Secondary | ICD-10-CM | POA: Insufficient documentation

## 2022-12-28 DIAGNOSIS — E538 Deficiency of other specified B group vitamins: Secondary | ICD-10-CM

## 2022-12-28 DIAGNOSIS — F17200 Nicotine dependence, unspecified, uncomplicated: Secondary | ICD-10-CM | POA: Diagnosis not present

## 2022-12-28 MED ORDER — CYANOCOBALAMIN 1000 MCG/ML IJ SOLN
1000.0000 ug | Freq: Once | INTRAMUSCULAR | Status: AC
  Administered 2022-12-28: 1000 ug via INTRAMUSCULAR

## 2022-12-28 NOTE — Assessment & Plan Note (Signed)
Rpt b12 shot today.

## 2022-12-28 NOTE — Assessment & Plan Note (Signed)
Exam most consistent with benign irritation fibroma.  Decreasing in size. Rec continue to monitor and if enlarging or changing to notify us for further evaluation. Encouraged complete smoking cessation.

## 2022-12-28 NOTE — Progress Notes (Signed)
Patient ID: Russell Gomez, male    DOB: [birth year], 62 y.o.   MRN: [id number]  This visit was conducted in person.  BP 130/70   Pulse 69   Temp (!) 97.3 F (36.3 C) (Temporal)   Ht 6' 2"$  (1.88 m)   Wt 205 lb 8 oz (93.2 kg)   SpO2 96%   BMI 26.38 kg/m    CC: check spot in mouth Subjective:   HPI: Russell Gomez is a 62 y.o. male presenting on 12/28/2022 for Mouth Lesions (C/o lesion on inside of bottom lip. Noticed about 1 mo ago. Area had increased in size but has now decreased. Denies any pain. )   Now working 2nd shift. Takes meds at 11am, 2nd dose around midnight when he gets home. Bedtime 2-3am, wakes up at 10am.   1 mo h/o lesion to inner left lower lip - initially larger, now seems to be smaller. Not painful or tender but he can feel it.  Dentist - has seen one in [place], last seen 6 months ago, no upcoming appts. Uses full dentures, they are well fitting.   Smoking - continued smoker <1/2 ppd.  No alcohol.      Relevant past medical, surgical, family and social history reviewed and updated as indicated. Interim medical history since our last visit reviewed. Allergies and medications reviewed and updated. Outpatient Medications Prior to Visit  Medication Sig Dispense Refill   apixaban (ELIQUIS) 5 MG TABS tablet TAKE 1 TABLET (5MG TOTAL) BY MOUTH 2 TIMES A DAY. 180 tablet 3   cyanocobalamin (,VITAMIN B-12,) 1000 MCG/ML injection Inject 1 mL (1,000 mcg total) into the muscle every 30 (thirty) days.     insulin NPH Human (NOVOLIN N) 100 UNIT/ML injection Inject 0.13 mLs (13 Units total) into the skin 2 (two) times daily before a meal. 10 mL    insulin regular (NOVOLIN R) 100 units/mL injection Inject 2-15 Units into the skin 3 (three) times daily before meals.     magnesium oxide (MAG-OX) 400 (240 Mg) MG tablet Take 1 tablet (400 mg total) by mouth daily. 30 tablet 2   metoprolol tartrate (LOPRESSOR) 50 MG tablet Take 1 tablet (50 mg total) by mouth 2 (two) times  daily. 180 tablet 3   pantoprazole (PROTONIX) 40 MG tablet Take 1 tablet (40 mg total) by mouth at bedtime. 90 tablet 3   pravastatin (PRAVACHOL) 80 MG tablet TAKE 1 TABLET(80 MG) BY MOUTH DAILY 90 tablet 3   torsemide (DEMADEX) 20 MG tablet Take 1 tablet (20 mg total) by mouth daily. 90 tablet 3   No facility-administered medications prior to visit.     Per HPI unless specifically indicated in ROS section below Review of Systems  Objective:  BP 130/70   Pulse 69   Temp (!) 97.3 F (36.3 C) (Temporal)   Ht 6' 2"$  ([id number] m)   Wt [id number] lb 8 oz (93.2 kg)   SpO2 96%   BMI 26.38 kg/m   Wt Readings from Last 3 Encounters:  12/28/22 205 lb 8 oz (93.2 kg)  08/20/22 216 lb 6 oz (98.1 kg)  08/16/22 206 lb (93.4 kg)      Physical Exam Vitals and nursing note reviewed.  Constitutional:      Appearance: Normal appearance. He is not ill-appearing.  HENT:     Mouth/Throat:     Lips: Pink. No lesions.     Mouth: Mucous membranes are moist. Oral lesions present.     Tongue:  No lesions.     Palate: No mass.     Pharynx: Oropharynx is clear. No oropharyngeal exudate or posterior oropharyngeal erythema.     Comments:  Edentulous, dentures absent Benign appearing small nodule to left inner lower lip Small ulcer at base of anterior gumline Neurological:     Mental Status: He is alert.       Lab Results  Component Value Date   VITAMINB12 218 04/21/2022     Assessment & Plan:   Problem List Items Addressed This Visit     TOBACCO ABUSE    Down to >1/2 ppd Encouraged full smoking cessation.       Vitamin B12 deficiency    Rpt b12 shot today.       Lesion of oral mucosa - Primary    Exam most consistent with benign irritation fibroma.  Decreasing in size. Rec continue to monitor and if enlarging or changing to notify us for further evaluation. Encouraged complete smoking cessation.         Meds ordered this encounter  Medications   cyanocobalamin (VITAMIN B12) injection  1,000 mcg    No orders of the defined types were placed in this encounter.   Patient Instructions  Oral lesion looks overall ok possibly small nodule or irritation fibroma. Ok to keep an eye on it for now, let us know if enlarging or changing.   Follow up plan: Return if symptoms worsen or fail to improve.  Ria Bush, MD

## 2022-12-28 NOTE — Assessment & Plan Note (Signed)
Down to >1/2 ppd Encouraged full smoking cessation.

## 2022-12-28 NOTE — Patient Instructions (Addendum)
Oral lesion looks overall ok possibly small nodule or irritation fibroma. Ok to keep an eye on it for now, let us know if enlarging or changing.

## 2023-01-21 ENCOUNTER — Ambulatory Visit: Payer: BC Managed Care – PPO | Admitting: Family Medicine

## 2023-01-26 ENCOUNTER — Ambulatory Visit: Payer: BC Managed Care – PPO | Admitting: Family Medicine

## 2023-01-26 ENCOUNTER — Encounter: Payer: Self-pay | Admitting: Family Medicine

## 2023-01-26 VITALS — BP 124/72 | HR 75 | Temp 97.3°F | Ht 74.0 in | Wt 203.4 lb

## 2023-01-26 DIAGNOSIS — E108 Type 1 diabetes mellitus with unspecified complications: Secondary | ICD-10-CM | POA: Diagnosis not present

## 2023-01-26 DIAGNOSIS — I4891 Unspecified atrial fibrillation: Secondary | ICD-10-CM

## 2023-01-26 DIAGNOSIS — E538 Deficiency of other specified B group vitamins: Secondary | ICD-10-CM

## 2023-01-26 DIAGNOSIS — M40204 Unspecified kyphosis, thoracic region: Secondary | ICD-10-CM | POA: Diagnosis not present

## 2023-01-26 LAB — POCT GLYCOSYLATED HEMOGLOBIN (HGB A1C): Hemoglobin A1C: 8.3 % — AB (ref 4.0–5.6)

## 2023-01-26 MED ORDER — CYANOCOBALAMIN 1000 MCG/ML IJ SOLN
1000.0000 ug | Freq: Once | INTRAMUSCULAR | Status: AC
  Administered 2023-01-26: 1000 ug via INTRAMUSCULAR

## 2023-01-26 NOTE — Assessment & Plan Note (Signed)
Pt attributes to poor posture. Prior chest xray showed chronic decrease in height of lower thoracic vertebral bodies - pt denies h/o thoracic vertebral fracture or h/o trauma/injury or pain to that area. Discussed possible eval for osteoporosis/osteopenia in h/o alcohol abuse. Will further discuss at CPE.

## 2023-01-26 NOTE — Progress Notes (Signed)
Patient ID: Russell Gomez, male    DOB: 09-Mar-1961, 62 y.o.   MRN: [id number]  This visit was conducted in person.  BP 124/72   Pulse 75   Temp (!) 97.3 F (36.3 C) (Temporal)   Ht 6\' 2"  (1.88 m)   Wt 203 lb 6 oz (92.3 kg)   SpO2 96%   BMI 26.11 kg/m    CC: 3 mo DM f/u visit  Subjective:   HPI: Russell Gomez is a 62 y.o. male presenting on 01/26/2023 for Medical Management of Chronic Issues (Here for DM f/u.)   New job- working 2nd shift. Now working at [employer].   ER visit 08/2022 with [facility] - this largely resolved.   B12 shot today #4/6 monthly shot.   DM - does regularly check sugars: elevated in am to 300s, drop during work day to low 100s by 6pm - due to activity at work. Compliant with antihyperglycemic regimen which includes: novolin N 12u BID, Novolin R 2-15u TID AC. He sees endocrinology Dr Honor Junes, not recently - upcoming appt 2 month. Notes hypoglycemic symptoms in evenings. Lowest cbg 32 - during work - managed with peanut butter crackers. Denies paresthesias, blurry vision. Last diabetic eye exam 12/2021 - DUE. Glucometer brand: Accucheck. He's not interested in CGM. Last foot exam: DUE. DSME: declines. Lab Results  Component Value Date   HGBA1C 8.3 (A) 01/26/2023   Diabetic Foot Exam - Simple   Simple Foot Form Diabetic Foot exam was performed with the following findings: Yes 01/26/2023  9:25 AM  Visual Inspection See comments: Yes Sensation Testing Intact to touch and monofilament testing bilaterally: Yes Pulse Check Posterior Tibialis and Dorsalis pulse intact bilaterally: Yes Comments Pre ulcerative callus formation to R medial great toe    Lab Results  Component Value Date   MICROALBUR <0.7 07/23/2022        Relevant past medical, surgical, family and social history reviewed and updated as indicated. Interim medical history since our last visit reviewed. Allergies and medications reviewed and updated. Outpatient Medications Prior to  Visit  Medication Sig Dispense Refill   apixaban (ELIQUIS) 5 MG TABS tablet TAKE 1 TABLET (5MG  TOTAL) BY MOUTH 2 TIMES A DAY. 180 tablet 3   cyanocobalamin (,VITAMIN B-12,) 1000 MCG/ML injection Inject 1 mL (1,000 mcg total) into the muscle every 30 (thirty) days.     insulin regular (NOVOLIN R) 100 units/mL injection Inject 2-15 Units into the skin 3 (three) times daily before meals.     magnesium oxide (MAG-OX) 400 (240 Mg) MG tablet Take 1 tablet (400 mg total) by mouth daily. 30 tablet 2   metoprolol tartrate (LOPRESSOR) 50 MG tablet Take 1 tablet (50 mg total) by mouth 2 (two) times daily. 180 tablet 3   pantoprazole (PROTONIX) 40 MG tablet Take 1 tablet (40 mg total) by mouth at bedtime. 90 tablet 3   pravastatin (PRAVACHOL) 80 MG tablet TAKE 1 TABLET(80 MG) BY MOUTH DAILY 90 tablet 3   torsemide (DEMADEX) 20 MG tablet Take 1 tablet (20 mg total) by mouth daily. 90 tablet 3   insulin NPH Human (NOVOLIN N) 100 UNIT/ML injection Inject 0.13 mLs (13 Units total) into the skin 2 (two) times daily before a meal. 10 mL    insulin NPH Human (NOVOLIN N) 100 UNIT/ML injection Inject 0.12 mLs (12 Units total) into the skin 2 (two) times daily before a meal.     No facility-administered medications prior to visit.  Per HPI unless specifically indicated in ROS section below Review of Systems  Objective:  BP 124/72   Pulse 75   Temp (!) 97.3 F (36.3 C) (Temporal)   Ht 6\' 2"  (1.88 m)   Wt 203 lb 6 oz (92.3 kg)   SpO2 96%   BMI 26.11 kg/m   Wt Readings from Last 3 Encounters:  01/26/23 203 lb 6 oz (92.3 kg)  12/28/22 205 lb 8 oz (93.2 kg)  08/20/22 216 lb 6 oz (98.1 kg)      Physical Exam Vitals and nursing note reviewed.  Constitutional:      Appearance: Normal appearance. He is not ill-appearing.  Eyes:     Extraocular Movements: Extraocular movements intact.     Conjunctiva/sclera: Conjunctivae normal.     Pupils: Pupils are equal, round, and reactive to light.   Cardiovascular:     Rate and Rhythm: Normal rate and regular rhythm.     Pulses: Normal pulses.     Heart sounds: Normal heart sounds. No murmur heard. Pulmonary:     Effort: Pulmonary effort is normal. No respiratory distress.     Breath sounds: Normal breath sounds. No wheezing, rhonchi or rales.  Musculoskeletal:     Right lower leg: No edema.     Left lower leg: No edema.     Comments:  See HPI for foot exam if done Prominent mid thoracic spine kyphosis  Skin:    General: Skin is warm and dry.     Findings: No rash.  Neurological:     Mental Status: He is alert.  Psychiatric:        Mood and Affect: Mood normal.        Behavior: Behavior normal.       Results for orders placed or performed in visit on 01/26/23  POCT glycosylated hemoglobin (Hb A1C)  Result Value Ref Range   Hemoglobin A1C 8.3 (A) 4.0 - 5.6 %   HbA1c POC (<> result, manual entry)     HbA1c, POC (prediabetic range)     HbA1c, POC (controlled diabetic range)     Lab Results  Component Value Date   VITAMINB12 218 04/21/2022   Lab Results  Component Value Date   FOLATE 16.9 07/23/2022   Assessment & Plan:   Problem List Items Addressed This Visit     Type 1 diabetes mellitus with complications ([place]) - Primary    Last saw endo 04/2022. Notes intermittent hypoglycemia. He has f/u appt this summer.  Advised he contact endo for med titration sooner if ongoing hypoglycemia.  15-15 rule for hypoglycemia provided. Foot exam today.  Encouraged he schedule eye exma.  He is not interested in CGM.       Relevant Medications   insulin NPH Human (NOVOLIN N) 100 UNIT/ML injection   Other Relevant Orders   POCT glycosylated hemoglobin (Hb A1C) (Completed)   Vitamin B12 deficiency    4/6 IM b12 shot today. Check levels next labwork.  Will ask to schedule monthly nurse visits x2 more in interim.       Atrial fibrillation with RVR ([facility])    Continues eliquis and BB. Sees cardiology.       Kyphosis of  thoracic region    Pt attributes to poor posture. Prior chest xray showed chronic decrease in height of lower thoracic vertebral bodies - pt denies h/o thoracic vertebral fracture or h/o trauma/injury or pain to that area. Discussed possible eval for osteoporosis/osteopenia in h/o alcohol abuse. Will further  discuss at CPE.         Meds ordered this encounter  Medications   cyanocobalamin (VITAMIN B12) injection 1,000 mcg    Orders Placed This Encounter  Procedures   POCT glycosylated hemoglobin (Hb A1C)    Patient Instructions  Schedule eye exam at [facility] as you're due.  Let endocrinology know if you continue having low sugars. Good to see you today Return in 6 months for physical  The 15-15 rule for low sugars: If sugar reading below 70, have 15 grams of carbohydrate to raise your blood sugar and check it after 15 minutes. If it's still below 70 mg/dL, have another serving. 15 grams of carbs may be: -Glucose tablets (see instructions) -Gel tube (see instructions) -4 ounces (1/2 cup) of juice or regular soda (not diet) -1 tablespoon of sugar, honey, or corn syrup -Hard candies, jellybeans or gumdrops--see food label for how many to consume  Repeat these steps until your blood sugar is at least 70 mg/dL. Once your blood sugar is back to normal, eat a meal or snack to make sure it doesn't lower again.    Follow up plan: Return in about 6 months (around 07/29/2023), or if symptoms worsen or fail to improve, for annual exam, prior fasting for blood work.  Ria Bush, MD

## 2023-01-26 NOTE — Assessment & Plan Note (Signed)
Continues eliquis and BB. Sees cardiology.

## 2023-01-26 NOTE — Patient Instructions (Addendum)
Schedule eye exam at [facility] as you're due.  Let endocrinology know if you continue having low sugars. Good to see you today Return in 6 months for physical  The 15-15 rule for low sugars: If sugar reading below 70, have 15 grams of carbohydrate to raise your blood sugar and check it after 15 minutes. If it's still below 70 mg/dL, have another serving. 15 grams of carbs may be: -Glucose tablets (see instructions) -Gel tube (see instructions) -4 ounces (1/2 cup) of juice or regular soda (not diet) -1 tablespoon of sugar, honey, or corn syrup -Hard candies, jellybeans or gumdrops--see food label for how many to consume  Repeat these steps until your blood sugar is at least 70 mg/dL. Once your blood sugar is back to normal, eat a meal or snack to make sure it doesn't lower again.

## 2023-01-26 NOTE — Assessment & Plan Note (Signed)
Last saw endo 04/2022. Notes intermittent hypoglycemia. He has f/u appt this summer.  Advised he contact endo for med titration sooner if ongoing hypoglycemia.  15-15 rule for hypoglycemia provided. Foot exam today.  Encouraged he schedule eye exma.  He is not interested in CGM.

## 2023-01-26 NOTE — Assessment & Plan Note (Addendum)
4/6 IM b12 shot today. Check levels next labwork.  Will ask to schedule monthly nurse visits x2 more in interim.

## 2023-02-03 ENCOUNTER — Other Ambulatory Visit: Payer: Self-pay | Admitting: Physician Assistant

## 2023-02-03 NOTE — Telephone Encounter (Signed)
Pt scheduled on 4/23

## 2023-02-03 NOTE — Telephone Encounter (Signed)
Please schedule overdue F/U appointment for refills. Thank you! 
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
## 2023-03-01 ENCOUNTER — Encounter: Payer: Self-pay | Admitting: Physician Assistant

## 2023-03-01 ENCOUNTER — Other Ambulatory Visit
Admission: RE | Admit: 2023-03-01 | Discharge: 2023-03-01 | Disposition: A | Discharge status: Home / Self Care | Payer: BC Managed Care – PPO | Source: Ambulatory Visit | Attending: Physician Assistant | Admitting: Physician Assistant

## 2023-03-01 ENCOUNTER — Ambulatory Visit: Payer: BC Managed Care – PPO | Attending: Physician Assistant | Admitting: Physician Assistant

## 2023-03-01 ENCOUNTER — Ambulatory Visit (INDEPENDENT_AMBULATORY_CARE_PROVIDER_SITE_OTHER): Payer: BC Managed Care – PPO

## 2023-03-01 VITALS — BP 142/76 | HR 69 | Ht 74.0 in | Wt 209.7 lb

## 2023-03-01 DIAGNOSIS — E0590 Thyrotoxicosis, unspecified without thyrotoxic crisis or storm: Secondary | ICD-10-CM

## 2023-03-01 DIAGNOSIS — Z87898 Personal history of other specified conditions: Secondary | ICD-10-CM

## 2023-03-01 DIAGNOSIS — I10 Essential (primary) hypertension: Secondary | ICD-10-CM

## 2023-03-01 DIAGNOSIS — E785 Hyperlipidemia, unspecified: Secondary | ICD-10-CM

## 2023-03-01 DIAGNOSIS — E538 Deficiency of other specified B group vitamins: Secondary | ICD-10-CM | POA: Diagnosis not present

## 2023-03-01 DIAGNOSIS — Z720 Tobacco use: Secondary | ICD-10-CM

## 2023-03-01 DIAGNOSIS — I480 Paroxysmal atrial fibrillation: Secondary | ICD-10-CM

## 2023-03-01 DIAGNOSIS — I700 Atherosclerosis of aorta: Secondary | ICD-10-CM

## 2023-03-01 LAB — CBC
HCT: 42.1 % (ref 39.0–52.0)
Hemoglobin: 13.7 g/dL (ref 13.0–17.0)
MCH: 30.2 pg (ref 26.0–34.0)
MCHC: 32.5 g/dL (ref 30.0–36.0)
MCV: 92.9 fL (ref 80.0–100.0)
Platelets: 303 10*3/uL (ref 150–400)
RBC: 4.53 MIL/uL (ref 4.22–5.81)
RDW: 14 % (ref 11.5–15.5)
WBC: 10.2 10*3/uL (ref 4.0–10.5)
nRBC: 0 % (ref 0.0–0.2)

## 2023-03-01 LAB — BASIC METABOLIC PANEL
Anion gap: 8 (ref 5–15)
BUN: 16 mg/dL (ref 6–20)
CO2: 28 mmol/L (ref 22–32)
Calcium: 8.7 mg/dL — ABNORMAL LOW (ref 8.9–10.3)
Chloride: 102 mmol/L (ref 98–111)
Creatinine, Ser: 0.75 mg/dL (ref 0.61–1.24)
GFR, Estimated: >60 mL/min (ref >60)
Glucose, Bld: 126 mg/dL — ABNORMAL HIGH (ref 70–99)
Potassium: 4.1 mmol/L (ref 3.5–5.1)
Sodium: 138 mmol/L (ref 135–145)

## 2023-03-01 MED ORDER — CYANOCOBALAMIN 1000 MCG/ML IJ SOLN
1000.0000 ug | Freq: Once | INTRAMUSCULAR | Status: AC
  Administered 2023-03-01: 1000 ug via INTRAMUSCULAR

## 2023-03-01 MED ORDER — APIXABAN 5 MG PO TABS: ORAL_TABLET | ORAL | 11 refills | Status: DC

## 2023-03-01 MED ORDER — METOPROLOL TARTRATE 50 MG PO TABS: 50.0000 mg | ORAL_TABLET | Freq: Two times a day (BID) | ORAL | 3 refills | Status: DC

## 2023-03-01 NOTE — Patient Instructions (Signed)
Medication Instructions:  No changes at this time.   *If you need a refill on your cardiac medications before your next appointment, please call your pharmacy*   Lab Work: CBC & BMET today over at the [facility]. Stop at registration desk to check in.   If you have labs (blood work) drawn today and your tests are completely normal, you will receive your results only by: MyChart Message (if you have MyChart) OR A paper copy in the mail If you have any lab test that is abnormal or we need to change your treatment, we will call you to review the results.   Testing/Procedures: Your physician has requested that you have an echocardiogram. Echocardiography is a painless test that uses sound waves to create images of your heart. It provides your doctor with information about the size and shape of your heart and how well your heart's chambers and valves are working. This procedure takes approximately one hour. There are no restrictions for this procedure. Please do NOT wear cologne, perfume, aftershave, or lotions (deodorant is allowed). Please arrive 15 minutes prior to your appointment time.    Follow-Up: At [facility], you and your health needs are our priority.  As part of our continuing mission to provide you with exceptional heart care, we have created designated Provider Care Teams.  These Care Teams include your primary Cardiologist (physician) and Advanced Practice Providers (APPs -  Physician Assistants and Nurse Practitioners) who all work together to provide you with the care you need, when you need it.  Your next appointment:   6 month(s)  Provider:   Julien Nordmann, MD or Eula Listen, PA-C

## 2023-03-01 NOTE — Progress Notes (Signed)
Per orders of Dr. [name], injection of Vitamin B 12 given in right arm at pts request given by Lewanda Rife. Patient tolerated injection well. Pt gets monthly B 12 injections.

## 2023-03-09 DIAGNOSIS — I5189 Other ill-defined heart diseases: Secondary | ICD-10-CM

## 2023-03-09 HISTORY — DX: Other ill-defined heart diseases: I51.89

## 2023-03-15 LAB — HM DIABETES EYE EXAM

## 2023-03-18 ENCOUNTER — Encounter: Payer: Self-pay | Admitting: Family Medicine

## 2023-03-29 ENCOUNTER — Encounter: Payer: Self-pay | Admitting: Emergency Medicine

## 2023-03-29 ENCOUNTER — Telehealth: Payer: Self-pay | Admitting: *Deleted

## 2023-03-29 ENCOUNTER — Ambulatory Visit: Payer: BC Managed Care – PPO | Attending: Physician Assistant

## 2023-03-29 ENCOUNTER — Encounter: Payer: Self-pay | Admitting: *Deleted

## 2023-03-29 DIAGNOSIS — I480 Paroxysmal atrial fibrillation: Secondary | ICD-10-CM

## 2023-03-29 LAB — ECHOCARDIOGRAM COMPLETE
AR max vel: 3.85 cm2
AV Area VTI: 3.61 cm2
AV Area mean vel: 3.75 cm2
AV Mean grad: 2 mmHg
AV Peak grad: 3.5 mmHg
Ao pk vel: 0.93 m/s
Area-P 1/2: 4.49 cm2
Calc EF: 46.3 %
S' Lateral: 4 cm
Single Plane A2C EF: 47.1 %
Single Plane A4C EF: 46.5 %

## 2023-03-29 NOTE — Telephone Encounter (Signed)
Left voicemail message to call back for review of results and scheduling.   I did review appointments with APP and next available would be 6/18 or 6/25 and he was agreeable with those dates.

## 2023-03-29 NOTE — Telephone Encounter (Signed)
-----   Message from [name], PA-C sent at 03/29/2023  2:18 PM EDT ----- Echo showed low normal pump function, normal wall motion, mildly stiffened heart, moderately dilated left atrium, mildly leaky mitral valve, and normal pressure in the upper right chamber of the heart.  When compared to prior study, pump function remains normal, though is a little lower than prior.  Heart is also more stiff.  Mitral valve is stable.  Please move patient's appointment up to be seen within the next several weeks to reassess symptoms and discuss if further testing is needed.

## 2023-03-30 ENCOUNTER — Telehealth: Payer: Self-pay | Admitting: Physician Assistant

## 2023-03-30 NOTE — Telephone Encounter (Signed)
Pt returning call for echo results  

## 2023-03-30 NOTE — Telephone Encounter (Signed)
Spoke with patient and informed him of the providers notes/recommendations from echo as follows:  "Echo showed low normal pump function, normal wall motion, mildly stiffened heart, moderately dilated left atrium, mildly leaky mitral valve, and normal pressure in the upper right chamber of the heart.  When compared to prior study, pump function remains normal, though is a little lower than prior.  Heart is also more stiff.  Mitral valve is stable.  Please move patient's appointment up to be seen within the next several weeks to reassess symptoms and discuss if further testing is needed."  Patient scheduled for Tuesday 04/26/23 at 8:25 AM

## 2023-03-31 ENCOUNTER — Telehealth: Payer: Self-pay | Admitting: Family Medicine

## 2023-03-31 MED ORDER — MAGNESIUM OXIDE -MG SUPPLEMENT 400 (240 MG) MG PO TABS: 400.0000 mg | ORAL_TABLET | Freq: Every day | ORAL | 2 refills | Status: DC

## 2023-03-31 NOTE — Telephone Encounter (Signed)
Received Fax RX request from  Pharmacy -  [id number] - [place], [place] - [address] AT [facility] [address] Phone: [phone]  Fax: [phone]      Medication - magnesium oxide (MAG-OX) 400 (240 Mg) MG tablet   Last Refill - 03/01/23  Last OV - 01/26/23  Last CPE - 07/23/2022  Next Appointment - 07/29/23

## 2023-03-31 NOTE — Telephone Encounter (Signed)
ERx 

## 2023-04-05 ENCOUNTER — Ambulatory Visit (INDEPENDENT_AMBULATORY_CARE_PROVIDER_SITE_OTHER): Payer: BC Managed Care – PPO | Admitting: *Deleted

## 2023-04-05 ENCOUNTER — Telehealth: Payer: Self-pay | Admitting: Family Medicine

## 2023-04-05 DIAGNOSIS — E538 Deficiency of other specified B group vitamins: Secondary | ICD-10-CM

## 2023-04-05 MED ORDER — CYANOCOBALAMIN 1000 MCG/ML IJ SOLN
1000.0000 ug | Freq: Once | INTRAMUSCULAR | Status: AC
  Administered 2023-04-05: 1000 ug via INTRAMUSCULAR

## 2023-04-05 NOTE — Progress Notes (Signed)
Per orders of Dr. Sharen Hones, injection of Vitamin B 12 given by Sydell Axon. Patient tolerated injection well.  Last of 6 shots. See office notes.

## 2023-04-05 NOTE — Telephone Encounter (Signed)
Spoke with pt relaying Dr. Timoteo Expose message. Pt verbalizes understanding and scheduled vit B12 labs on 05/10/23 at 9:15 and NV for vit B12 shot on 05/10/23 at 9:30.

## 2023-04-05 NOTE — Telephone Encounter (Signed)
6th B12 shot done today. Please call to schedule lab visit in 1 month to recheck B12 levels, may schedule nurse visit for B12 shot after lab draw.  I've ordered labs

## 2023-04-08 NOTE — Telephone Encounter (Signed)
Reviewed results and recommendations with patient. Confirmed upcoming appointment as well. He verbalized understanding with no further questions at this time.

## 2023-04-26 ENCOUNTER — Ambulatory Visit: Payer: BC Managed Care – PPO | Admitting: Physician Assistant

## 2023-05-05 ENCOUNTER — Encounter: Payer: Self-pay | Admitting: Family Medicine

## 2023-05-05 ENCOUNTER — Ambulatory Visit (INDEPENDENT_AMBULATORY_CARE_PROVIDER_SITE_OTHER): Payer: BC Managed Care – PPO | Admitting: Family Medicine

## 2023-05-05 VITALS — BP 120/68 | HR 74 | Temp 97.2°F | Ht 74.0 in | Wt 209.2 lb

## 2023-05-05 DIAGNOSIS — M545 Low back pain, unspecified: Secondary | ICD-10-CM | POA: Diagnosis not present

## 2023-05-05 MED ORDER — CYCLOBENZAPRINE HCL 10 MG PO TABS: 10.0000 mg | ORAL_TABLET | Freq: Every day | ORAL | 0 refills | Status: DC

## 2023-05-05 MED ORDER — TRAMADOL HCL 50 MG PO TABS: 50.0000 mg | ORAL_TABLET | Freq: Three times a day (TID) | ORAL | 0 refills | Status: AC | PRN

## 2023-05-05 NOTE — Progress Notes (Signed)
Patient ID: Russell Gomez, male    DOB: [birth year], 62 y.o.   MRN: [id number]  This visit was conducted in person.  BP 120/68   Pulse 74   Temp (!) 97.2 F (36.2 C) (Temporal)   Ht 6\' 2"  (1.88 m)   Wt 209 lb 4 oz (94.9 kg)   SpO2 98%   BMI 26.87 kg/m    CC:  Chief Complaint  Patient presents with   Back Injury    C/o back pain after being snatched by dog while holding leash. Feels like pulled muscle. Happened on 05/03/23.    Subjective:   HPI: Russell Gomez is a 62 y.o. male patient of Dr. Sharen Hones with history of diabetes, hypertension , atrial fibrillation, abdominal aortic aneurysm presenting on 05/05/2023 for Back Injury (C/o back pain after being snatched by dog while holding leash. Feels like pulled muscle. Happened on 05/03/23.)   New onset pain in right lower back.  Occurred following injury when dog on leash pulled him forward suddenly on May 03, 2023.  Pulled laterally on left arm.  Noted spasm and cramping in back immediately.  Applied heat.. took ibuprofen 800 mg.. helped temporarily.  Now pain significant enough is is making it tough to walk.   Pain is better with lying down. Can sleep   No new numbness or weakness.   No new incontinence. No fever.    Has khyphosis of toracic spine.  Possible osteoporosis per pt.   Works at [employer].. 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 . he has been out of work  since 6/25 day of injury.       Relevant past medical, surgical, family and social history reviewed and updated as indicated. Interim medical history since our last visit reviewed. Allergies and medications reviewed and updated. Outpatient Medications Prior to Visit  Medication Sig Dispense Refill   apixaban (ELIQUIS) 5 MG TABS tablet TAKE 1 TABLET (5MG  TOTAL) BY MOUTH 2 TIMES A DAY. 60 tablet 11   cyanocobalamin (,VITAMIN B-12,) 1000 MCG/ML injection Inject 1 mL (1,000 mcg total) into the muscle every 30 (thirty) days.      insulin NPH Human (NOVOLIN N) 100 UNIT/ML injection Inject 0.12 mLs (12 Units total) into the skin 2 (two) times daily before a meal.     insulin regular (NOVOLIN R) 100 units/mL injection Inject 2-15 Units into the skin 3 (three) times daily before meals.     magnesium oxide (MAG-OX) 400 (240 Mg) MG tablet Take 1 tablet (400 mg total) by mouth daily. 90 tablet 2   metoprolol tartrate (LOPRESSOR) 50 MG tablet Take 1 tablet (50 mg total) by mouth 2 (two) times daily. Final refill until seen in clinic. 180 tablet 3   pantoprazole (PROTONIX) 40 MG tablet Take 1 tablet (40 mg total) by mouth at bedtime. 90 tablet 3   pravastatin (PRAVACHOL) 80 MG tablet TAKE 1 TABLET(80 MG) BY MOUTH DAILY 90 tablet 3   torsemide (DEMADEX) 20 MG tablet Take 1 tablet (20 mg total) by mouth daily. 90 tablet 3   No facility-administered medications prior to visit.     Per HPI unless specifically indicated in ROS section below Review of Systems  Constitutional:  Negative for fatigue and fever.  HENT:  Negative for ear pain.   Eyes:  Negative for pain.  Respiratory:  Negative for cough and shortness of breath.   Cardiovascular:  Negative for chest pain, palpitations and leg swelling.  Gastrointestinal:  Negative for abdominal pain.  Genitourinary:  Negative for dysuria.  Musculoskeletal:  Negative for arthralgias.  Neurological:  Negative for syncope, light-headedness and headaches.  Psychiatric/Behavioral:  Negative for dysphoric mood.    Objective:  BP 120/68   Pulse 74   Temp (!) 97.2 F (36.2 C) (Temporal)   Ht 6\' 2"  (1.88 m)   Wt 209 lb 4 oz (94.9 kg)   SpO2 98%   BMI 26.87 kg/m   Wt Readings from Last 3 Encounters:  05/05/23 209 lb 4 oz (94.9 kg)  03/01/23 209 lb 10.6 oz (95.1 kg)  01/26/23 203 lb 6 oz (92.3 kg)      Physical Exam Constitutional:      Appearance: He is well-developed.  HENT:     Head: Normocephalic.     Right Ear: Hearing normal.     Left Ear: Hearing normal.     Nose: Nose  normal.  Neck:     Thyroid: No thyroid mass or thyromegaly.     Vascular: No carotid bruit.     Trachea: Trachea normal.  Cardiovascular:     Rate and Rhythm: Normal rate and regular rhythm.     Pulses: Normal pulses.     Heart sounds: Heart sounds not distant. No murmur heard.    No friction rub. No gallop.     Comments: No peripheral edema Pulmonary:     Effort: Pulmonary effort is normal. No respiratory distress.     Breath sounds: Normal breath sounds.  Musculoskeletal:     Thoracic back: Decreased range of motion.     Lumbar back: Spasms, tenderness and bony tenderness present. Decreased range of motion. Negative right straight leg raise test and negative left straight leg raise test.       Back:  Skin:    General: Skin is warm and dry.     Findings: No rash.  Psychiatric:        Speech: Speech normal.        Behavior: Behavior normal.        Thought Content: Thought content normal.       Results for orders placed or performed in visit on 03/29/23  ECHOCARDIOGRAM COMPLETE  Result Value Ref Range   AR max vel 3.85 cm2   AV Peak grad 3.5 mmHg   Ao pk vel 0.93 m/s   S' Lateral 4.00 cm   Area-P 1/2 4.49 cm2   AV Area VTI 3.61 cm2   AV Mean grad 2.0 mmHg   Single Plane A4C EF 46.5 %   Single Plane A2C EF 47.1 %   Calc EF 46.3 %   AV Area mean vel 3.75 cm2   Est EF 50 - 55%     Assessment and Plan  Acute right-sided low back pain without sciatica Assessment & Plan: Acute, most consistent with musculoskeletal strain/spasm.  He does have some diffuse pain near vertebral column, but no clear indication for x-ray.  Given his kyphosis there is some consideration by his PCP for osteoporosis, but no definitive diagnosis. If pain not improving as expected consider x-ray.  NSAIDs contraindicated in setting of anticoagulation. Encouraged patient to stop ibuprofen.  He will use tramado 50 mg every 8 hours as needed for pain.  He can also use Flexeril at night.  He will  start heat and was given home physical therapy to begin.  He will be unable for the short-term to perform his job duties given repetitive twisting and lifting.  He  was given a note to remain out of work until June 29.  He will call with an update at that time for possible need for longer relief.   Other orders -     Cyclobenzaprine HCl; Take 1 tablet (10 mg total) by mouth at bedtime.  Dispense: 15 tablet; Refill: 0 -     traMADol HCl; Take 1 tablet (50 mg total) by mouth every 8 (eight) hours as needed for up to 5 days.  Dispense: 15 tablet; Refill: 0    No follow-ups on file.   Kerby Nora, MD

## 2023-05-05 NOTE — Assessment & Plan Note (Signed)
Acute, most consistent with musculoskeletal strain/spasm.  He does have some diffuse pain near vertebral column, but no clear indication for x-ray.  Given his kyphosis there is some consideration by his PCP for osteoporosis, but no definitive diagnosis. If pain not improving as expected consider x-ray.  NSAIDs contraindicated in setting of anticoagulation. Encouraged patient to stop ibuprofen.  He will use tramado 50 mg every 8 hours as needed for pain.  He can also use Flexeril at night.  He will start heat and was given home physical therapy to begin.  He will be unable for the short-term to perform his job duties given repetitive twisting and lifting.  He was given a note to remain out of work until June 29.  He will call with an update at that time for possible need for longer relief.

## 2023-05-10 ENCOUNTER — Ambulatory Visit: Payer: BC Managed Care – PPO

## 2023-05-10 ENCOUNTER — Other Ambulatory Visit: Payer: BC Managed Care – PPO

## 2023-05-16 DIAGNOSIS — R4182 Altered mental status, unspecified: Secondary | ICD-10-CM | POA: Diagnosis not present

## 2023-05-16 DIAGNOSIS — E162 Hypoglycemia, unspecified: Secondary | ICD-10-CM | POA: Diagnosis not present

## 2023-06-03 DIAGNOSIS — R404 Transient alteration of awareness: Secondary | ICD-10-CM | POA: Diagnosis not present

## 2023-06-03 DIAGNOSIS — E161 Other hypoglycemia: Secondary | ICD-10-CM | POA: Diagnosis not present

## 2023-06-11 ENCOUNTER — Other Ambulatory Visit: Payer: Self-pay | Admitting: Physician Assistant

## 2023-06-12 ENCOUNTER — Other Ambulatory Visit: Payer: Self-pay | Admitting: Family Medicine

## 2023-06-12 DIAGNOSIS — E1069 Type 1 diabetes mellitus with other specified complication: Secondary | ICD-10-CM

## 2023-06-15 ENCOUNTER — Telehealth: Payer: Self-pay | Admitting: Family Medicine

## 2023-06-15 DIAGNOSIS — K219 Gastro-esophageal reflux disease without esophagitis: Secondary | ICD-10-CM

## 2023-06-15 MED ORDER — PANTOPRAZOLE SODIUM 40 MG PO TBEC: 40.0000 mg | DELAYED_RELEASE_TABLET | Freq: Every day | ORAL | 0 refills | Status: DC

## 2023-06-15 NOTE — Telephone Encounter (Signed)
E-scribed refill 

## 2023-06-15 NOTE — Telephone Encounter (Signed)
Prescription Request  06/15/2023  LOV: 01/26/2023  What is the name of the medication or equipment? pantoprazole (PROTONIX) 40 MG tablet (2 week supply, not 30 days)   Have you contacted your pharmacy to request a refill? No   Which pharmacy would you like this sent to?  [id number] - [place], [place] - [address] AT [facility] [address] [facility] [address] [place] [address] Phone: [phone] Fax: [phone]    Patient notified that their request is being sent to the clinical staff for review and that they should receive a response within 2 business days.   Please advise at Mobile [phone] (mobile)  Pt states a mail order pharmacy will be contacting Dr. Reece Agar for future refills.

## 2023-06-17 ENCOUNTER — Emergency Department: Payer: BC Managed Care – PPO

## 2023-06-17 ENCOUNTER — Emergency Department
Admission: EM | Admit: 2023-06-17 | Discharge: 2023-06-18 | Disposition: A | Discharge status: Home / Self Care | Payer: BC Managed Care – PPO | Attending: Emergency Medicine | Admitting: Emergency Medicine

## 2023-06-17 ENCOUNTER — Other Ambulatory Visit: Payer: Self-pay

## 2023-06-17 DIAGNOSIS — E109 Type 1 diabetes mellitus without complications: Secondary | ICD-10-CM | POA: Diagnosis not present

## 2023-06-17 DIAGNOSIS — K469 Unspecified abdominal hernia without obstruction or gangrene: Secondary | ICD-10-CM | POA: Diagnosis not present

## 2023-06-17 DIAGNOSIS — I10 Essential (primary) hypertension: Secondary | ICD-10-CM | POA: Diagnosis not present

## 2023-06-17 DIAGNOSIS — R1030 Lower abdominal pain, unspecified: Secondary | ICD-10-CM | POA: Diagnosis not present

## 2023-06-17 DIAGNOSIS — K838 Other specified diseases of biliary tract: Secondary | ICD-10-CM | POA: Diagnosis not present

## 2023-06-17 DIAGNOSIS — K429 Umbilical hernia without obstruction or gangrene: Secondary | ICD-10-CM | POA: Diagnosis not present

## 2023-06-17 DIAGNOSIS — Z7901 Long term (current) use of anticoagulants: Secondary | ICD-10-CM | POA: Diagnosis not present

## 2023-06-17 DIAGNOSIS — R109 Unspecified abdominal pain: Secondary | ICD-10-CM | POA: Diagnosis not present

## 2023-06-17 DIAGNOSIS — K4090 Unilateral inguinal hernia, without obstruction or gangrene, not specified as recurrent: Secondary | ICD-10-CM | POA: Diagnosis not present

## 2023-06-17 LAB — COMPREHENSIVE METABOLIC PANEL
ALT: 16 U/L (ref 0–44)
AST: 17 U/L (ref 15–41)
Albumin: 3.7 g/dL (ref 3.5–5.0)
Alkaline Phosphatase: 61 U/L (ref 38–126)
Anion gap: 10 (ref 5–15)
BUN: 21 mg/dL — ABNORMAL HIGH (ref 6–20)
CO2: 25 mmol/L (ref 22–32)
Calcium: 9 mg/dL (ref 8.9–10.3)
Chloride: 100 mmol/L (ref 98–111)
Creatinine, Ser: 0.97 mg/dL (ref 0.61–1.24)
GFR, Estimated: >60 mL/min (ref >60)
Glucose, Bld: 164 mg/dL — ABNORMAL HIGH (ref 70–99)
Potassium: 3.8 mmol/L (ref 3.5–5.1)
Sodium: 135 mmol/L (ref 135–145)
Total Bilirubin: 0.9 mg/dL (ref 0.3–1.2)
Total Protein: 6.7 g/dL (ref 6.5–8.1)

## 2023-06-17 LAB — CBC
HCT: 41.4 % (ref 39.0–52.0)
Hemoglobin: 13.9 g/dL (ref 13.0–17.0)
MCH: 31.3 pg (ref 26.0–34.0)
MCHC: 33.6 g/dL (ref 30.0–36.0)
MCV: 93.2 fL (ref 80.0–100.0)
Platelets: 307 10*3/uL (ref 150–400)
RBC: 4.44 MIL/uL (ref 4.22–5.81)
RDW: 13.2 % (ref 11.5–15.5)
WBC: 8.7 10*3/uL (ref 4.0–10.5)
nRBC: 0 % (ref 0.0–0.2)

## 2023-06-17 LAB — LIPASE, BLOOD: Lipase: 32 U/L (ref 11–51)

## 2023-06-17 NOTE — ED Notes (Signed)
Pt given UA cup and instructed on use for sample.

## 2023-06-17 NOTE — ED Triage Notes (Addendum)
Pt states he was working today lifting heavy tools and felt something strain in his lower abdomen where he had intestinal surgeries two years ago- pt states he got nauseous when this happened but couldn't throw up. Pt AOX4, abdomen round, soft, mild distension noted, possible hernia palpated in lower abdomen- pt states palpated mass is not new.

## 2023-06-18 NOTE — ED Provider Notes (Signed)
[facility] Provider Note    Event Date/Time   First MD Initiated Contact with Patient 06/17/23 2328     (approximate)   History   Abdominal Pain   HPI  Russell Gomez is a 62 y.o. male   Past medical history of atrial fibrillation on anticoagulation, hyperlipidemia, type I diabetic, hypertension, multiple abdominal surgeries including appendectomy, colon resection, who presents to the emergency department after experiencing a bulge in his abdomen after lifting something heavy at work.  No significant pain.  Has been passing gas and bowel movement since.  No other acute medical complaints.  No urinary symptoms.  No gi bleeding.   External Medical Documents Reviewed: Dr. Vedia Coffer surgery note for colostomy reversal back in February 2023      Physical Exam   Triage Vital Signs: ED Triage Vitals [06/17/23 2019]  Encounter Vitals Group     BP (!) 151/107     Systolic BP Percentile      Diastolic BP Percentile      Pulse Rate 71     Resp 17     Temp 98.1 F (36.7 C)     Temp Source Oral     SpO2 100 %     Weight      Height      Head Circumference      Peak Flow      Pain Score 3     Pain Loc      Pain Education      Exclude from Growth Chart     Most recent vital signs: Vitals:   06/18/23 0000 06/18/23 0024  BP: (!) 156/84 (!) 152/81  Pulse: 76 72  Resp: 16 16  Temp:    SpO2: 100% 99%    General: Awake, no distress.  CV:  Good peripheral perfusion.  Resp:  Normal effort.  Abd:  No distention.  Other:  Wake alert comfortable with normal hemodynamics nontoxic-appearing pleasant gentleman.  He has no significant abdominal distention but does have a periumbilical and left-sided abdominal soft mass that is reducible.   ED Results / Procedures / Treatments   Labs (all labs ordered are listed, but only abnormal results are displayed) Labs Reviewed  COMPREHENSIVE METABOLIC PANEL - Abnormal; Notable for the following components:       Result Value   Glucose, Bld 164 (*)    BUN 21 (*)    All other components within normal limits  URINALYSIS, ROUTINE W REFLEX MICROSCOPIC - Abnormal; Notable for the following components:   Color, Urine STRAW (*)    APPearance CLEAR (*)    All other components within normal limits  LIPASE, BLOOD  CBC     I ordered and reviewed the above labs they are notable for white blood cell count is normal.    RADIOLOGY I independently reviewed and interpreted CT scan of the abdomen and pelvis and see left and right sided lower abdominal hernias with loops of bowel protruding.  No obvious obstruction. I also reviewed radiologist's formal read.   PROCEDURES:  Critical Care performed: No  Procedures   MEDICATIONS ORDERED IN ED: Medications - No data to display  IMPRESSION / MDM / ASSESSMENT AND PLAN / ED COURSE  I reviewed the triage vital signs and the nursing notes.                                Patient's presentation is  most consistent with acute presentation with potential threat to life or bodily function.  Differential diagnosis includes, but is not limited to, abdominal hernia, incarcerated hernia, strangulated hernia, obstruction   The patient is on the cardiac monitor to evaluate for evidence of arrhythmia and/or significant heart rate changes.  MDM:   Reducible hernia in this patient with history of abdominal surgeries.  No obstructive symptoms.  CT imaging shows no signs of incarceration or strangulation there is no overlying skin changes patient is comfortable after reduction.  Since there is no indication for emergency surgery tonight, he can be discharged and have close follow-up with surgery Dr. Maia Plan who performed his last colostomy reversal last year for elective hernia repair.  He understands to come back if he develop any new or worsening symptoms      FINAL CLINICAL IMPRESSION(S) / ED DIAGNOSES   Final diagnoses:  Abdominal hernia without obstruction and  without gangrene, recurrence not specified, unspecified hernia type     Rx / DC Orders   ED Discharge Orders     None        Note:  This document was prepared using Dragon voice recognition software and may include unintentional dictation errors.    Pilar Jarvis, MD 06/18/23 [phone]

## 2023-06-18 NOTE — Discharge Instructions (Signed)
Call Dr. Maia Plan of general surgery for an appointment to address your hernia and options for repairing.  If you develop any severe pain, skin changes, inability to pass gas or stools, this is a sign that the hernia has become trapped and can be dangerous so you must come back to the emergency department.  Thank you for choosing [place] for your health care today!  Please see your primary doctor this week for a follow up appointment.   If you have any new, worsening, or unexpected symptoms call your doctor right away or come back to the emergency department for reevaluation.  It was my pleasure to care for you today.   Daneil Dan Modesto Charon, MD

## 2023-06-21 ENCOUNTER — Telehealth: Payer: Self-pay | Admitting: Cardiovascular Disease

## 2023-06-21 ENCOUNTER — Telehealth: Payer: Self-pay | Admitting: Family Medicine

## 2023-06-21 MED ORDER — METOPROLOL TARTRATE 50 MG PO TABS: 50.0000 mg | ORAL_TABLET | Freq: Two times a day (BID) | ORAL | 0 refills | Status: DC

## 2023-06-21 MED ORDER — APIXABAN 5 MG PO TABS: ORAL_TABLET | ORAL | 1 refills | Status: DC

## 2023-06-21 MED ORDER — APIXABAN 5 MG PO TABS: ORAL_TABLET | ORAL | 0 refills | Status: DC

## 2023-06-21 NOTE — Telephone Encounter (Signed)
Pt called in stated he can no longer get his medication filled at [facility] stated two request was fax to office for the change of Pharmacy to a mail in pharmacy . Please Advise [phone]

## 2023-06-21 NOTE — Telephone Encounter (Signed)
Prescription refill request for Eliquis received. Indication: PAF Last office visit: 03/01/23  R Dunn PA-C Scr: 0.97 on 06/17/23  Epic Age: 62 Weight: 95.1kg  Based on above findings Eliquis 5mg  twice daily is the appropriate dose.  Refill approved.

## 2023-06-21 NOTE — Telephone Encounter (Signed)
Noted  

## 2023-06-21 NOTE — Telephone Encounter (Signed)
Refill request for Eliquis. Please see telephone note. Thank you so much.

## 2023-06-21 NOTE — Telephone Encounter (Signed)
  Pharmacy change  *STAT* If patient is at the pharmacy, call can be transferred to refill team.   1. Which medications need to be refilled? (please list name of each medication and dose if known)   apixaban (ELIQUIS) 5 MG TABS tablet  metoprolol tartrate (LOPRESSOR) 50 MG tablet   2. Would you like to learn more about the convenience, safety, & potential cost savings by using the [facility]? no   3. Are you open to using the [facility] (Type [facility] ). no   4. Which pharmacy/location (including street and city if local pharmacy) is medication to be sent to?  [facility] - [place], [place] - [address]   5. Do they need a 30 day or 90 day supply?  90 day  Patient also will be taking his last Eliquis today and needs an emergency prescription to cover him until the mail order comes in. Please call this into [id number] - [place], [place] - [address]

## 2023-06-21 NOTE — Telephone Encounter (Signed)
Patient's wife returned call regarding pharmacy, states [facility] now. I have added this to patient's preferred pharmacy list

## 2023-06-21 NOTE — Telephone Encounter (Signed)
Spoke with pt's wife, Lawson Fiscal (on dpr), asking what mail order pharmacy pt needs to use. Says he can't think of it. I recommended pt call insurance co to find out the name, then call us back so we can add it to his chart. Also, let us know what refills, if any, he needs sent to them now. Per Lawson Fiscal, pt will still use [facility] for short term meds.

## 2023-06-23 ENCOUNTER — Ambulatory Visit: Payer: Self-pay | Admitting: General Surgery

## 2023-06-23 ENCOUNTER — Encounter (INDEPENDENT_AMBULATORY_CARE_PROVIDER_SITE_OTHER): Payer: Self-pay

## 2023-06-23 DIAGNOSIS — K432 Incisional hernia without obstruction or gangrene: Secondary | ICD-10-CM | POA: Diagnosis not present
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
## 2023-06-28 ENCOUNTER — Ambulatory Visit: Payer: Self-pay | Admitting: General Surgery

## 2023-06-29 ENCOUNTER — Encounter
Admission: RE | Admit: 2023-06-29 | Discharge: 2023-06-29 | Disposition: A | Discharge status: Home / Self Care | Payer: BC Managed Care – PPO | Source: Ambulatory Visit | Attending: General Surgery | Admitting: General Surgery

## 2023-06-29 VITALS — Ht 74.0 in | Wt 210.1 lb

## 2023-06-29 DIAGNOSIS — Z01818 Encounter for other preprocedural examination: Secondary | ICD-10-CM

## 2023-06-29 HISTORY — DX: Other disorders of phosphorus metabolism: E83.39

## 2023-06-29 HISTORY — DX: Unspecified kyphosis, thoracic region: M40.204

## 2023-06-29 HISTORY — DX: Other complications of anesthesia, initial encounter: T88.59XA

## 2023-06-29 HISTORY — DX: Unspecified intestinal obstruction, unspecified as to partial versus complete obstruction: K56.609

## 2023-06-29 HISTORY — DX: Perforation of intestine (nontraumatic): K63.1

## 2023-06-29 HISTORY — DX: Atherosclerosis of aorta: I70.0

## 2023-06-29 HISTORY — DX: Type 2 diabetes mellitus with ketoacidosis without coma: E11.10

## 2023-06-29 HISTORY — DX: Nontoxic single thyroid nodule: E04.1

## 2023-06-29 HISTORY — DX: Nutritional anemia, unspecified: D53.9

## 2023-06-29 HISTORY — DX: Male erectile dysfunction, unspecified: N52.9

## 2023-06-29 HISTORY — DX: Unspecified mood (affective) disorder: F39

## 2023-06-29 NOTE — Patient Instructions (Signed)
Your procedure is scheduled on:07-06-23 Wednesday Report to the Registration Desk on the 1st floor of the [facility].Then proceed to the 2nd floor Surgery Desk To find out your arrival time, please call [phone] between 1PM - 3PM on:07-05-23 Tuesday If your arrival time is 6:00 am, do not arrive before that time as the [facility] entrance doors do not open until 6:00 am.  REMEMBER: Instructions that are not followed completely may result in serious medical risk, up to and including death; or upon the discretion of your surgeon and anesthesiologist your surgery may need to be rescheduled.  Do not eat food OR drink any liquids after midnight the night before surgery.  No gum chewing or hard candies.  One week prior to surgery: Stop Anti-inflammatories (NSAIDS) such as Advil, Aleve, Ibuprofen, Motrin, Naproxen, Naprosyn and Aspirin based products such as Excedrin, Goody's Powder, BC Powder.You may however, take Tylenol if needed for pain up until the day of surgery. Stop ANY OVER THE COUNTER supplements/vitamins NOW (06-29-23) until after surgery (Magnesium Oxide)  Continue taking all prescribed medications with the exception of the following: -apixaban (ELIQUIS)-Last dose will be on 07-03-23 (Sunday) as instructed by Dr. Hazle Quant  TAKE ONLY THESE MEDICATIONS THE MORNING OF SURGERY WITH A SIP OF WATER: -metoprolol tartrate (LOPRESSOR)  -pantoprazole (PROTONIX)  -pravastatin (PRAVACHOL)   Do NOT take any Insulin the morning of surgery  No Alcohol for 24 hours before or after surgery.  No Smoking including e-cigarettes for 24 hours before surgery.  No chewable tobacco products for at least 6 hours before surgery.  No nicotine patches on the day of surgery.  Do not use any "recreational" drugs for at least a week (preferably 2 weeks) before your surgery.  Please be advised that the combination of cocaine and anesthesia may have negative outcomes, up to and including death. If you  test positive for cocaine, your surgery will be cancelled.  On the morning of surgery brush your teeth with toothpaste and water, you may rinse your mouth with mouthwash if you wish. Do not swallow any toothpaste or mouthwash.  Use CHG Soap as directed on instruction sheet.  Do not wear jewelry, make-up, hairpins, clips or nail polish.  Do not wear lotions, powders, or perfumes.   Do not shave body hair from the neck down 48 hours before surgery.  Contact lenses, hearing aids and dentures may not be worn into surgery.  Do not bring valuables to the hospital. [facility] is not responsible for any missing/lost belongings or valuables.    Notify your doctor if there is any change in your medical condition (cold, fever, infection).  Wear comfortable clothing (specific to your surgery type) to the hospital.  After surgery, you can help prevent lung complications by doing breathing exercises.  Take deep breaths and cough every 1-2 hours. Your doctor may order a device called an Incentive Spirometer to help you take deep breaths. When coughing or sneezing, hold a pillow firmly against your incision with both hands. This is called "splinting." Doing this helps protect your incision. It also decreases belly discomfort.  If you are being admitted to the hospital overnight, leave your suitcase in the car. After surgery it may be brought to your room.  In case of increased patient census, it may be necessary for you, the patient, to continue your postoperative care in the Same Day Surgery department.  If you are being discharged the day of surgery, you will not be allowed to drive home.  You will need a responsible individual to drive you home and stay with you for 24 hours after surgery.   If you are taking public transportation, you will need to have a responsible individual with you.  Please call the Pre-admissions Testing Dept. at [phone] if you have any questions about these  instructions.  Surgery Visitation Policy:  Patients having surgery or a procedure may have two visitors.  Children under the age of 68 must have an adult with them who is not the patient.  Inpatient Visitation:    Visiting hours are 7 a.m. to 8 p.m. Up to four visitors are allowed at one time in a patient room. The visitors may rotate out with other people during the day.  One visitor age 59 or older may stay with the patient overnight and must be in the room by 8 p.m.     Preparing for Surgery with CHLORHEXIDINE GLUCONATE (CHG) Soap  Chlorhexidine Gluconate (CHG) Soap  o An antiseptic cleaner that kills germs and bonds with the skin to continue killing germs even after washing  o Used for showering the night before surgery and morning of surgery  Before surgery, you can play an important role by reducing the number of germs on your skin.  CHG (Chlorhexidine gluconate) soap is an antiseptic cleanser which kills germs and bonds with the skin to continue killing germs even after washing.  Please do not use if you have an allergy to CHG or antibacterial soaps. If your skin becomes reddened/irritated stop using the CHG.  1. Shower the NIGHT BEFORE SURGERY and the MORNING OF SURGERY with CHG soap.  2. If you choose to wash your hair, wash your hair first as usual with your normal shampoo.  3. After shampooing, rinse your hair and body thoroughly to remove the shampoo.  4. Use CHG as you would any other liquid soap. You can apply CHG directly to the skin and wash gently with a scrungie or a clean washcloth.  5. Apply the CHG soap to your body only from the neck down. Do not use on open wounds or open sores. Avoid contact with your eyes, ears, mouth, and genitals (private parts). Wash face and genitals (private parts) with your normal soap.  6. Wash thoroughly, paying special attention to the area where your surgery will be performed.  7. Thoroughly rinse your body with warm  water.  8. Do not shower/wash with your normal soap after using and rinsing off the CHG soap.  9. Pat yourself dry with a clean towel.  10. Wear clean pajamas to bed the night before surgery.  12. Place clean sheets on your bed the night of your first shower and do not sleep with pets.  13. Shower again with the CHG soap on the day of surgery prior to arriving at the hospital.  14. Do not apply any deodorants/lotions/powders.  15. Please wear clean clothes to the hospital.

## 2023-06-30 ENCOUNTER — Telehealth: Payer: Self-pay

## 2023-06-30 ENCOUNTER — Telehealth: Payer: Self-pay | Admitting: Family Medicine

## 2023-06-30 ENCOUNTER — Encounter: Payer: Self-pay | Admitting: General Surgery

## 2023-06-30 DIAGNOSIS — K219 Gastro-esophageal reflux disease without esophagitis: Secondary | ICD-10-CM

## 2023-06-30 MED ORDER — PANTOPRAZOLE SODIUM 40 MG PO TBEC
40.0000 mg | DELAYED_RELEASE_TABLET | Freq: Every day | ORAL | 0 refills | Status: DC
Start: 2023-06-30 — End: 2023-09-08

## 2023-06-30 MED ORDER — TORSEMIDE 20 MG PO TABS: 20.0000 mg | ORAL_TABLET | Freq: Every day | ORAL | 0 refills | Status: DC

## 2023-06-30 MED ORDER — MAGNESIUM OXIDE -MG SUPPLEMENT 400 (240 MG) MG PO TABS: 400.0000 mg | ORAL_TABLET | Freq: Every day | ORAL | 0 refills | Status: DC

## 2023-06-30 NOTE — Telephone Encounter (Addendum)
-----   Message from Verlee Monte sent at 06/30/2023  9:54 AM EDT -----  Regarding: Request for pre-operative cardiac clearance Request for pre-operative cardiac clearance:  LAST APPOINTMENT: 03/01/23 Eula Listen NEXT APPOINTMENT: NONE SCHEDULED  1. What type of surgery is being performed?  VENTRAL HERNIA REPAIR  2. When is this surgery scheduled?  0828/2024   3. Type of clearance being requested (medical, pharmacy, both)? BOTH   4. Are there any medications that need to be held prior to surgery? APIXABAN  5. Practice name and name of physician performing surgery?  Performing surgeon: Dr. [name], MD Requesting clearance: Quentin Mulling, FNP-C     6. Anesthesia type (none, local, MAC, general)? GENERAL  7. What is the office phone and fax number?   Phone: [phone] Fax: [phone]  ATTENTION: Unable to create telephone message as per your standard workflow. Directed by [facility] providers to send requests for cardiac clearance to this pool for appropriate distribution to provider covering pre-operative clearances.   Quentin Mulling, MSN, APRN, FNP-C, CEN [facility]  Peri-operative Services Nurse Practitioner Phone: [phone] 06/30/23 9:54 AM

## 2023-06-30 NOTE — Telephone Encounter (Signed)
Called patient to inquire about what medications are needed to be sent to [facility]. He stated he magnesium oxide 400 mg, pantoprazole 40 mg and torsemide 20 mg daily. Patient stated that he is almost out and will need all of the medications sent in as soon as possible. Patient thank me for calling him back.

## 2023-06-30 NOTE — Telephone Encounter (Signed)
Patient with diagnosis of afib on Eliquis for anticoagulation.    Procedure: ventral hernia repair Date of procedure: 07/06/23   CHA2DS2-VASc Score = 3   This indicates a 3.2% annual risk of stroke. The patient's score is based upon: CHF History: 0 HTN History: 1 Diabetes History: 1 Stroke History: 0 Vascular Disease History: 1 Age Score: 0 Gender Score: 0    CrCl 109 ml/min Platelet count 307  Per office protocol, patient can hold Eliquis for 2 days prior to procedure.     **This guidance is not considered finalized until pre-operative APP has relayed final recommendations.**
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
## 2023-06-30 NOTE — Telephone Encounter (Signed)
  Patient Consent for Virtual Visit         Russell Gomez has provided verbal consent on 06/30/2023 for a virtual visit (video or telephone).   CONSENT FOR VIRTUAL VISIT FOR:  Russell Gomez  By participating in this virtual visit I agree to the following:  I hereby voluntarily request, consent and authorize [facility] and its employed or contracted physicians, physician assistants, nurse practitioners or other licensed health care professionals (the Practitioner), to provide me with telemedicine health care services (the "Services") as deemed necessary by the treating Practitioner. I acknowledge and consent to receive the Services by the Practitioner via telemedicine. I understand that the telemedicine visit will involve communicating with the Practitioner through live audiovisual communication technology and the disclosure of certain medical information by electronic transmission. I acknowledge that I have been given the opportunity to request an in-person assessment or other available alternative prior to the telemedicine visit and am voluntarily participating in the telemedicine visit.  I understand that I have the right to withhold or withdraw my consent to the use of telemedicine in the course of my care at any time, without affecting my right to future care or treatment, and that the Practitioner or I may terminate the telemedicine visit at any time. I understand that I have the right to inspect all information obtained and/or recorded in the course of the telemedicine visit and may receive copies of available information for a reasonable fee.  I understand that some of the potential risks of receiving the Services via telemedicine include:  Delay or interruption in medical evaluation due to technological equipment failure or disruption; Information transmitted may not be sufficient (e.g. poor resolution of images) to allow for appropriate medical decision making by the  Practitioner; and/or  In rare instances, security protocols could fail, causing a breach of personal health information.  Furthermore, I acknowledge that it is my responsibility to provide information about my medical history, conditions and care that is complete and accurate to the best of my ability. I acknowledge that Practitioner's advice, recommendations, and/or decision may be based on factors not within their control, such as incomplete or inaccurate data provided by me or distortions of diagnostic images or specimens that may result from electronic transmissions. I understand that the practice of medicine is not an exact science and that Practitioner makes no warranties or guarantees regarding treatment outcomes. I acknowledge that a copy of this consent can be made available to me via my patient portal [facility] MyChart), or I can request a printed copy by calling the office of [facility].    I understand that my insurance will be billed for this visit.   I have read or had this consent read to me. I understand the contents of this consent, which adequately explains the benefits and risks of the Services being provided via telemedicine.  I have been provided ample opportunity to ask questions regarding this consent and the Services and have had my questions answered to my satisfaction. I give my informed consent for the services to be provided through the use of telemedicine in my medical care

## 2023-06-30 NOTE — Telephone Encounter (Addendum)
Patient is agreeable with telehealth appt. Med list reviewed and consent given.  Ok per Alden Server Dick,NP to add the patient to tomorrows schedule due to patient having procedure on 07/06/23.

## 2023-06-30 NOTE — Telephone Encounter (Signed)
Pharmacy please advise on holding Eliquis prior to ventral hernia repair scheduled for 07/06/2023. Thank you.

## 2023-06-30 NOTE — Telephone Encounter (Signed)
Pt called stated he has not receive his medication . Stated his medication need to be resend to [facility] DELIVERY -  # please advise [phone]

## 2023-06-30 NOTE — Telephone Encounter (Signed)
   Name: Russell Gomez  DOB: [birth year]  MRN: [id number]  Primary Cardiologist: Julien Nordmann, MD   Preoperative team, please contact this patient and set up a phone call appointment for further preoperative risk assessment. Please obtain consent and complete medication review. Thank you for your help.  I confirm that guidance regarding antiplatelet and oral anticoagulation therapy has been completed and, if necessary, noted below.  Per office protocol, patient can hold Eliquis for 2 days prior to procedure.    Napoleon Form, Leodis Rains, NP 06/30/2023, 2:55 PM [facility]

## 2023-07-01 ENCOUNTER — Ambulatory Visit: Payer: BC Managed Care – PPO | Attending: Interventional Cardiology

## 2023-07-01 DIAGNOSIS — Z01810 Encounter for preprocedural cardiovascular examination: Secondary | ICD-10-CM
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
## 2023-07-03 DIAGNOSIS — R55 Syncope and collapse: Secondary | ICD-10-CM | POA: Diagnosis not present

## 2023-07-03 DIAGNOSIS — R404 Transient alteration of awareness: Secondary | ICD-10-CM | POA: Diagnosis not present

## 2023-07-03 DIAGNOSIS — E162 Hypoglycemia, unspecified: Secondary | ICD-10-CM | POA: Diagnosis not present

## 2023-07-03 DIAGNOSIS — E161 Other hypoglycemia: Secondary | ICD-10-CM | POA: Diagnosis not present

## 2023-07-05 MED ORDER — SODIUM CHLORIDE 0.9 % IV SOLN: INTRAVENOUS | Status: DC

## 2023-07-05 MED ORDER — CHLORHEXIDINE GLUCONATE 0.12 % MT SOLN
15.0000 mL | Freq: Once | OROMUCOSAL | Status: AC
  Administered 2023-07-06: 15 mL via OROMUCOSAL

## 2023-07-05 MED ORDER — CELECOXIB 200 MG PO CAPS
200.0000 mg | ORAL_CAPSULE | ORAL | Status: AC
  Administered 2023-07-06: 200 mg via ORAL

## 2023-07-05 MED ORDER — BUPIVACAINE LIPOSOME 1.3 % IJ SUSP: 20.0000 mL | Freq: Once | INTRAMUSCULAR | Status: DC

## 2023-07-05 MED ORDER — CEFAZOLIN SODIUM-DEXTROSE 2-4 GM/100ML-% IV SOLN
2.0000 g | INTRAVENOUS | Status: AC
  Administered 2023-07-06: 2 g via INTRAVENOUS

## 2023-07-05 MED ORDER — ORAL CARE MOUTH RINSE: 15.0000 mL | Freq: Once | OROMUCOSAL | Status: AC

## 2023-07-05 MED ORDER — GABAPENTIN 300 MG PO CAPS
300.0000 mg | ORAL_CAPSULE | ORAL | Status: AC
  Administered 2023-07-06: 300 mg via ORAL

## 2023-07-05 MED ORDER — ACETAMINOPHEN 500 MG PO TABS
1000.0000 mg | ORAL_TABLET | ORAL | Status: AC
  Administered 2023-07-06: 1000 mg via ORAL

## 2023-07-06 ENCOUNTER — Encounter: Payer: Self-pay | Admitting: General Surgery

## 2023-07-06 ENCOUNTER — Other Ambulatory Visit: Payer: Self-pay

## 2023-07-06 ENCOUNTER — Inpatient Hospital Stay: Payer: BC Managed Care – PPO | Admitting: Urgent Care

## 2023-07-06 ENCOUNTER — Inpatient Hospital Stay
Admission: RE | Admit: 2023-07-06 | Discharge: 2023-07-08 | DRG: 354 | Disposition: A | Discharge status: Home / Self Care | Payer: BC Managed Care – PPO | Attending: General Surgery | Admitting: General Surgery

## 2023-07-06 ENCOUNTER — Encounter
Admission: RE | Disposition: A | Discharge status: Home / Self Care | Payer: Self-pay | Source: Home / Self Care | Attending: General Surgery

## 2023-07-06 DIAGNOSIS — Z8249 Family history of ischemic heart disease and other diseases of the circulatory system: Secondary | ICD-10-CM | POA: Diagnosis not present

## 2023-07-06 DIAGNOSIS — Z833 Family history of diabetes mellitus: Secondary | ICD-10-CM

## 2023-07-06 DIAGNOSIS — K432 Incisional hernia without obstruction or gangrene: Principal | ICD-10-CM | POA: Diagnosis present

## 2023-07-06 DIAGNOSIS — Z888 Allergy status to other drugs, medicaments and biological substances status: Secondary | ICD-10-CM | POA: Diagnosis not present

## 2023-07-06 DIAGNOSIS — I5032 Chronic diastolic (congestive) heart failure: Secondary | ICD-10-CM | POA: Diagnosis present

## 2023-07-06 DIAGNOSIS — Z79899 Other long term (current) drug therapy: Secondary | ICD-10-CM

## 2023-07-06 DIAGNOSIS — K219 Gastro-esophageal reflux disease without esophagitis: Secondary | ICD-10-CM | POA: Diagnosis present

## 2023-07-06 DIAGNOSIS — Y990 Civilian activity done for income or pay: Secondary | ICD-10-CM | POA: Diagnosis not present

## 2023-07-06 DIAGNOSIS — K430 Incisional hernia with obstruction, without gangrene: Secondary | ICD-10-CM | POA: Diagnosis not present

## 2023-07-06 DIAGNOSIS — Z885 Allergy status to narcotic agent status: Secondary | ICD-10-CM

## 2023-07-06 DIAGNOSIS — I152 Hypertension secondary to endocrine disorders: Secondary | ICD-10-CM | POA: Diagnosis not present

## 2023-07-06 DIAGNOSIS — Z01818 Encounter for other preprocedural examination: Secondary | ICD-10-CM

## 2023-07-06 DIAGNOSIS — Z794 Long term (current) use of insulin: Secondary | ICD-10-CM

## 2023-07-06 DIAGNOSIS — Z7901 Long term (current) use of anticoagulants: Secondary | ICD-10-CM

## 2023-07-06 DIAGNOSIS — F1011 Alcohol abuse, in remission: Secondary | ICD-10-CM | POA: Diagnosis present

## 2023-07-06 DIAGNOSIS — E785 Hyperlipidemia, unspecified: Secondary | ICD-10-CM | POA: Diagnosis not present

## 2023-07-06 DIAGNOSIS — Z981 Arthrodesis status: Secondary | ICD-10-CM

## 2023-07-06 DIAGNOSIS — E1065 Type 1 diabetes mellitus with hyperglycemia: Secondary | ICD-10-CM | POA: Diagnosis not present

## 2023-07-06 DIAGNOSIS — R739 Hyperglycemia, unspecified: Secondary | ICD-10-CM | POA: Diagnosis not present

## 2023-07-06 DIAGNOSIS — Z9049 Acquired absence of other specified parts of digestive tract: Secondary | ICD-10-CM

## 2023-07-06 DIAGNOSIS — X500XXA Overexertion from strenuous movement or load, initial encounter: Secondary | ICD-10-CM

## 2023-07-06 DIAGNOSIS — E781 Pure hyperglyceridemia: Secondary | ICD-10-CM | POA: Diagnosis not present

## 2023-07-06 DIAGNOSIS — I482 Chronic atrial fibrillation, unspecified: Secondary | ICD-10-CM | POA: Diagnosis present

## 2023-07-06 DIAGNOSIS — F17200 Nicotine dependence, unspecified, uncomplicated: Secondary | ICD-10-CM | POA: Diagnosis present

## 2023-07-06 DIAGNOSIS — F17210 Nicotine dependence, cigarettes, uncomplicated: Secondary | ICD-10-CM | POA: Diagnosis not present

## 2023-07-06 DIAGNOSIS — I10 Essential (primary) hypertension: Secondary | ICD-10-CM | POA: Diagnosis present

## 2023-07-06 DIAGNOSIS — K439 Ventral hernia without obstruction or gangrene: Secondary | ICD-10-CM | POA: Diagnosis not present

## 2023-07-06 DIAGNOSIS — E1069 Type 1 diabetes mellitus with other specified complication: Secondary | ICD-10-CM | POA: Diagnosis not present

## 2023-07-06 HISTORY — DX: Long term (current) use of anticoagulants: Z79.01

## 2023-07-06 HISTORY — DX: Ventral hernia without obstruction or gangrene: K43.9

## 2023-07-06 HISTORY — PX: VENTRAL HERNIA REPAIR: SHX424

## 2023-07-06 HISTORY — DX: Thyrotoxicosis, unspecified without thyrotoxic crisis or storm: E05.90

## 2023-07-06 HISTORY — DX: Complete loss of teeth, unspecified cause, unspecified class: K08.109

## 2023-07-06 HISTORY — PX: INSERTION OF MESH: SHX5868

## 2023-07-06 LAB — GLUCOSE, CAPILLARY
Glucose-Capillary: 251 mg/dL — ABNORMAL HIGH (ref 70–99)
Glucose-Capillary: 397 mg/dL — ABNORMAL HIGH (ref 70–99)
Glucose-Capillary: 417 mg/dL — ABNORMAL HIGH (ref 70–99)
Glucose-Capillary: 369 mg/dL — ABNORMAL HIGH (ref 70–99)
Glucose-Capillary: 386 mg/dL — ABNORMAL HIGH (ref 70–99)
Glucose-Capillary: 339 mg/dL — ABNORMAL HIGH (ref 70–99)
Glucose-Capillary: 336 mg/dL — ABNORMAL HIGH (ref 70–99)
Glucose-Capillary: 255 mg/dL — ABNORMAL HIGH (ref 70–99)

## 2023-07-06 SURGERY — REPAIR, HERNIA, VENTRAL
Anesthesia: General

## 2023-07-06 MED ORDER — FENTANYL CITRATE (PF) 100 MCG/2ML IJ SOLN
INTRAMUSCULAR | Status: DC | PRN
  Administered 2023-07-06 (×4): 50 ug via INTRAVENOUS

## 2023-07-06 MED ORDER — ONDANSETRON 4 MG PO TBDP: 4.0000 mg | ORAL_TABLET | Freq: Four times a day (QID) | ORAL | Status: DC | PRN

## 2023-07-06 MED ORDER — MIDAZOLAM HCL 2 MG/2ML IJ SOLN
INTRAMUSCULAR | Status: DC | PRN
  Administered 2023-07-06: 2 mg via INTRAVENOUS

## 2023-07-06 MED ORDER — CEFAZOLIN SODIUM-DEXTROSE 2-4 GM/100ML-% IV SOLN
INTRAVENOUS | Status: AC
  Filled 2023-07-06: qty 100

## 2023-07-06 MED ORDER — MAGNESIUM OXIDE -MG SUPPLEMENT 400 (240 MG) MG PO TABS
400.0000 mg | ORAL_TABLET | Freq: Every day | ORAL | Status: DC
  Administered 2023-07-07: 400 mg via ORAL
  Filled 2023-07-06 (×3): qty 1

## 2023-07-06 MED ORDER — ROCURONIUM BROMIDE 10 MG/ML (PF) SYRINGE
PREFILLED_SYRINGE | INTRAVENOUS | Status: AC
  Filled 2023-07-06: qty 10

## 2023-07-06 MED ORDER — INSULIN REGULAR BOLUS VIA INFUSION: 10.0000 [IU] | Freq: Once | INTRAVENOUS | Status: DC

## 2023-07-06 MED ORDER — LIDOCAINE HCL (CARDIAC) PF 100 MG/5ML IV SOSY
PREFILLED_SYRINGE | INTRAVENOUS | Status: DC | PRN
  Administered 2023-07-06: 60 mg via INTRAVENOUS

## 2023-07-06 MED ORDER — SODIUM CHLORIDE (PF) 0.9 % IJ SOLN
INTRAMUSCULAR | Status: AC
  Filled 2023-07-06: qty 50

## 2023-07-06 MED ORDER — INSULIN ASPART 100 UNIT/ML IJ SOLN
INTRAMUSCULAR | Status: AC
  Filled 2023-07-06: qty 1

## 2023-07-06 MED ORDER — DEXAMETHASONE SODIUM PHOSPHATE 10 MG/ML IJ SOLN
INTRAMUSCULAR | Status: AC
  Filled 2023-07-06: qty 1

## 2023-07-06 MED ORDER — CHLORHEXIDINE GLUCONATE 0.12 % MT SOLN
OROMUCOSAL | Status: AC
  Filled 2023-07-06: qty 15

## 2023-07-06 MED ORDER — EPHEDRINE SULFATE (PRESSORS) 50 MG/ML IJ SOLN
INTRAMUSCULAR | Status: DC | PRN
  Administered 2023-07-06 (×2): 10 mg via INTRAVENOUS

## 2023-07-06 MED ORDER — HYDROMORPHONE HCL 1 MG/ML IJ SOLN
INTRAMUSCULAR | Status: AC
  Filled 2023-07-06: qty 1

## 2023-07-06 MED ORDER — INSULIN REGULAR HUMAN 100 UNIT/ML IJ SOLN
10.0000 [IU] | Freq: Once | INTRAMUSCULAR | Status: AC
  Administered 2023-07-06: 10 [IU] via SUBCUTANEOUS
  Filled 2023-07-06: qty 0.1

## 2023-07-06 MED ORDER — BUPIVACAINE LIPOSOME 1.3 % IJ SUSP
INTRAMUSCULAR | Status: AC
  Filled 2023-07-06: qty 20

## 2023-07-06 MED ORDER — GABAPENTIN 300 MG PO CAPS
ORAL_CAPSULE | ORAL | Status: AC
  Filled 2023-07-06: qty 1

## 2023-07-06 MED ORDER — ACETAMINOPHEN 500 MG PO TABS
ORAL_TABLET | ORAL | Status: AC
  Filled 2023-07-06: qty 2

## 2023-07-06 MED ORDER — METOPROLOL TARTRATE 25 MG PO TABS
ORAL_TABLET | ORAL | Status: AC
  Filled 2023-07-06: qty 2

## 2023-07-06 MED ORDER — INSULIN DETEMIR 100 UNIT/ML ~~LOC~~ SOLN
10.0000 [IU] | Freq: Every day | SUBCUTANEOUS | Status: DC
  Filled 2023-07-06: qty 0.1

## 2023-07-06 MED ORDER — INSULIN DETEMIR 100 UNIT/ML ~~LOC~~ SOLN
10.0000 [IU] | Freq: Two times a day (BID) | SUBCUTANEOUS | Status: DC
  Administered 2023-07-06 – 2023-07-07 (×2): 10 [IU] via SUBCUTANEOUS
  Filled 2023-07-06 (×2): qty 0.1

## 2023-07-06 MED ORDER — ENOXAPARIN SODIUM 40 MG/0.4ML IJ SOSY
40.0000 mg | PREFILLED_SYRINGE | INTRAMUSCULAR | Status: DC
  Administered 2023-07-07: 40 mg via SUBCUTANEOUS

## 2023-07-06 MED ORDER — FENTANYL CITRATE (PF) 100 MCG/2ML IJ SOLN
INTRAMUSCULAR | Status: AC
  Filled 2023-07-06: qty 2

## 2023-07-06 MED ORDER — SODIUM CHLORIDE (PF) 0.9 % IJ SOLN
INTRAMUSCULAR | Status: DC | PRN
  Administered 2023-07-06: 100 mL

## 2023-07-06 MED ORDER — CELECOXIB 200 MG PO CAPS
ORAL_CAPSULE | ORAL | Status: AC
  Filled 2023-07-06: qty 1

## 2023-07-06 MED ORDER — TORSEMIDE 20 MG PO TABS
20.0000 mg | ORAL_TABLET | Freq: Every day | ORAL | Status: DC
  Administered 2023-07-07: 20 mg via ORAL
  Filled 2023-07-06 (×3): qty 1

## 2023-07-06 MED ORDER — DEXMEDETOMIDINE HCL IN NACL 80 MCG/20ML IV SOLN
INTRAVENOUS | Status: DC | PRN
Start: 2023-07-06 — End: 2023-07-06
  Administered 2023-07-06: 12 ug via INTRAVENOUS
  Administered 2023-07-06: 8 ug via INTRAVENOUS

## 2023-07-06 MED ORDER — HYDROMORPHONE HCL 1 MG/ML IJ SOLN
INTRAMUSCULAR | Status: DC | PRN
Start: 2023-07-06 — End: 2023-07-06
  Administered 2023-07-06 (×2): .5 mg via INTRAVENOUS

## 2023-07-06 MED ORDER — CELECOXIB 200 MG PO CAPS
200.0000 mg | ORAL_CAPSULE | Freq: Two times a day (BID) | ORAL | Status: DC
  Administered 2023-07-06 – 2023-07-07 (×3): 200 mg via ORAL

## 2023-07-06 MED ORDER — PANTOPRAZOLE SODIUM 40 MG PO TBEC
40.0000 mg | DELAYED_RELEASE_TABLET | Freq: Every day | ORAL | Status: DC
  Administered 2023-07-06 – 2023-07-07 (×2): 40 mg via ORAL

## 2023-07-06 MED ORDER — LIDOCAINE HCL (PF) 2 % IJ SOLN
INTRAMUSCULAR | Status: AC
  Filled 2023-07-06: qty 5

## 2023-07-06 MED ORDER — 0.9 % SODIUM CHLORIDE (POUR BTL) OPTIME
TOPICAL | Status: DC | PRN
  Administered 2023-07-06: 1000 mL

## 2023-07-06 MED ORDER — GABAPENTIN 300 MG PO CAPS
300.0000 mg | ORAL_CAPSULE | Freq: Two times a day (BID) | ORAL | Status: DC
  Administered 2023-07-06 – 2023-07-07 (×3): 300 mg via ORAL

## 2023-07-06 MED ORDER — INSULIN ASPART 100 UNIT/ML IJ SOLN: 0.0000 [IU] | INTRAMUSCULAR | Status: DC

## 2023-07-06 MED ORDER — INSULIN ASPART 100 UNIT/ML IJ SOLN
2.0000 [IU] | Freq: Three times a day (TID) | INTRAMUSCULAR | Status: DC
  Administered 2023-07-06 – 2023-07-07 (×2): 2 [IU] via SUBCUTANEOUS

## 2023-07-06 MED ORDER — HYDROCODONE-ACETAMINOPHEN 5-325 MG PO TABS
1.0000 | ORAL_TABLET | ORAL | Status: DC | PRN
  Administered 2023-07-06: 1 via ORAL
  Administered 2023-07-07: 2 via ORAL
  Administered 2023-07-07: 1 via ORAL

## 2023-07-06 MED ORDER — ONDANSETRON HCL 4 MG/2ML IJ SOLN: 4.0000 mg | Freq: Four times a day (QID) | INTRAMUSCULAR | Status: DC | PRN

## 2023-07-06 MED ORDER — METOPROLOL TARTRATE 25 MG PO TABS
50.0000 mg | ORAL_TABLET | Freq: Two times a day (BID) | ORAL | Status: DC
  Administered 2023-07-06 – 2023-07-07 (×3): 50 mg via ORAL

## 2023-07-06 MED ORDER — FENTANYL CITRATE (PF) 100 MCG/2ML IJ SOLN
25.0000 ug | INTRAMUSCULAR | Status: DC | PRN
  Administered 2023-07-06 (×2): 25 ug via INTRAVENOUS

## 2023-07-06 MED ORDER — VISTASEAL 10 ML SINGLE DOSE KIT
PACK | CUTANEOUS | Status: DC | PRN
Start: 2023-07-06 — End: 2023-07-06
  Administered 2023-07-06: 10 mL via TOPICAL

## 2023-07-06 MED ORDER — PANTOPRAZOLE SODIUM 40 MG PO TBEC
DELAYED_RELEASE_TABLET | ORAL | Status: AC
  Filled 2023-07-06: qty 1

## 2023-07-06 MED ORDER — MIDAZOLAM HCL 2 MG/2ML IJ SOLN
INTRAMUSCULAR | Status: AC
  Filled 2023-07-06: qty 2

## 2023-07-06 MED ORDER — HYDROMORPHONE HCL 1 MG/ML IJ SOLN: 0.5000 mg | INTRAMUSCULAR | Status: DC | PRN

## 2023-07-06 MED ORDER — INSULIN ASPART 100 UNIT/ML IJ SOLN
0.0000 [IU] | Freq: Every day | INTRAMUSCULAR | Status: DC
  Administered 2023-07-06: 3 [IU] via SUBCUTANEOUS

## 2023-07-06 MED ORDER — VISTASEAL 10 ML SINGLE DOSE KIT
PACK | CUTANEOUS | Status: AC
  Filled 2023-07-06: qty 10

## 2023-07-06 MED ORDER — ONDANSETRON HCL 4 MG/2ML IJ SOLN
INTRAMUSCULAR | Status: AC
  Filled 2023-07-06: qty 2

## 2023-07-06 MED ORDER — INSULIN REGULAR HUMAN 100 UNIT/ML IJ SOLN
10.0000 [IU] | Freq: Once | INTRAMUSCULAR | Status: AC
  Administered 2023-07-06: 10 [IU] via INTRAVENOUS
  Filled 2023-07-06: qty 10

## 2023-07-06 MED ORDER — NON FORMULARY
10.0000 [IU] | Freq: Once | Status: DC
Start: 2023-07-06 — End: 2023-07-06

## 2023-07-06 MED ORDER — HYDROCODONE-ACETAMINOPHEN 5-325 MG PO TABS
ORAL_TABLET | ORAL | Status: AC
  Filled 2023-07-06: qty 1

## 2023-07-06 MED ORDER — OXYCODONE HCL 5 MG PO TABS: 5.0000 mg | ORAL_TABLET | Freq: Once | ORAL | Status: DC | PRN

## 2023-07-06 MED ORDER — INSULIN ASPART 100 UNIT/ML IJ SOLN
0.0000 [IU] | Freq: Three times a day (TID) | INTRAMUSCULAR | Status: DC
  Administered 2023-07-06: 7 [IU] via SUBCUTANEOUS
  Administered 2023-07-07: 3 [IU] via SUBCUTANEOUS

## 2023-07-06 MED ORDER — DEXAMETHASONE SODIUM PHOSPHATE 10 MG/ML IJ SOLN
INTRAMUSCULAR | Status: DC | PRN
  Administered 2023-07-06: 5 mg via INTRAVENOUS

## 2023-07-06 MED ORDER — SUGAMMADEX SODIUM 200 MG/2ML IV SOLN
INTRAVENOUS | Status: DC | PRN
  Administered 2023-07-06: 300 mg via INTRAVENOUS

## 2023-07-06 MED ORDER — PROPOFOL 10 MG/ML IV BOLUS
INTRAVENOUS | Status: AC
  Filled 2023-07-06: qty 40

## 2023-07-06 MED ORDER — ONDANSETRON HCL 4 MG/2ML IJ SOLN
INTRAMUSCULAR | Status: DC | PRN
  Administered 2023-07-06: 8 mg via INTRAVENOUS

## 2023-07-06 MED ORDER — ROCURONIUM BROMIDE 100 MG/10ML IV SOLN
INTRAVENOUS | Status: DC | PRN
  Administered 2023-07-06 (×2): 20 mg via INTRAVENOUS
  Administered 2023-07-06: 50 mg via INTRAVENOUS
  Administered 2023-07-06: 10 mg via INTRAVENOUS
  Administered 2023-07-06: 20 mg via INTRAVENOUS

## 2023-07-06 MED ORDER — PROPOFOL 10 MG/ML IV BOLUS
INTRAVENOUS | Status: DC | PRN
  Administered 2023-07-06: 180 mg via INTRAVENOUS

## 2023-07-06 MED ORDER — BUPIVACAINE-EPINEPHRINE (PF) 0.5% -1:200000 IJ SOLN
INTRAMUSCULAR | Status: AC
  Filled 2023-07-06: qty 30

## 2023-07-06 MED ORDER — POLYETHYLENE GLYCOL 3350 17 G PO PACK
17.0000 g | PACK | Freq: Two times a day (BID) | ORAL | Status: DC
  Administered 2023-07-07 (×2): 17 g via ORAL
  Filled 2023-07-06 (×4): qty 1

## 2023-07-06 MED ORDER — OXYCODONE HCL 5 MG/5ML PO SOLN: 5.0000 mg | Freq: Once | ORAL | Status: DC | PRN

## 2023-07-06 SURGICAL SUPPLY — 50 items
ADH SKN CLS APL DERMABOND .7 (GAUZE/BANDAGES/DRESSINGS) ×2
APL PRP STRL LF DISP 70% ISPRP (MISCELLANEOUS) ×2
BINDER ABDOMINAL 12 ML 46-62 (SOFTGOODS) ×1 IMPLANT
BLADE SURG 15 STRL LF DISP TIS (BLADE) ×2 IMPLANT
BLADE SURG 15 STRL SS (BLADE) ×2
BULB RESERV EVAC DRAIN JP 100C (MISCELLANEOUS) ×1 IMPLANT
CHLORAPREP W/TINT 26 (MISCELLANEOUS) ×2 IMPLANT
DERMABOND ADVANCED .7 DNX12 (GAUZE/BANDAGES/DRESSINGS) ×2 IMPLANT
DRAIN CHANNEL JP 15F RND 16 (MISCELLANEOUS) ×1 IMPLANT
DRAPE LAPAROTOMY 100X77 ABD (DRAPES) ×2 IMPLANT
ELECT CAUTERY BLADE 6.4 (BLADE) ×1 IMPLANT
ELECT EZSTD 165MM 6.5IN (MISCELLANEOUS) ×2
ELECT REM PT RETURN 9FT ADLT (ELECTROSURGICAL) ×2
ELECTRODE EZSTD 165MM 6.5IN (MISCELLANEOUS) IMPLANT
ELECTRODE REM PT RTRN 9FT ADLT (ELECTROSURGICAL) ×1 IMPLANT
GLOVE BIO SURGEON STRL SZ 6.5 (GLOVE) ×2 IMPLANT
GLOVE BIOGEL PI IND STRL 6.5 (GLOVE) ×2 IMPLANT
GOWN STRL REUS W/ TWL LRG LVL3 (GOWN DISPOSABLE) ×2 IMPLANT
GOWN STRL REUS W/TWL LRG LVL3 (GOWN DISPOSABLE) ×2
KIT PREVENA INCISION MGT20CM45 (CANNISTER) ×1 IMPLANT
KIT TURNOVER KIT A (KITS) ×2 IMPLANT
LABEL OR SOLS (LABEL) ×2 IMPLANT
MANIFOLD NEPTUNE II (INSTRUMENTS) ×2 IMPLANT
MESH SOFT 12X12IN BARD (Mesh General) ×1 IMPLANT
NDL 18GX1X1/2 (RX/OR ONLY) (NEEDLE) IMPLANT
NDL HYPO 25X1 1.5 SAFETY (NEEDLE) ×1 IMPLANT
NEEDLE 18GX1X1/2 (RX/OR ONLY) (NEEDLE) ×2
NEEDLE HYPO 25X1 1.5 SAFETY (NEEDLE) ×2
NS IRRIG 500ML POUR BTL (IV SOLUTION) ×2 IMPLANT
PACK BASIN MINOR ARMC (MISCELLANEOUS) ×2 IMPLANT
SPONGE T-LAP 18X18 ~~LOC~~+RFID (SPONGE) ×1 IMPLANT
SPONGE T-LAP 18X36 ~~LOC~~+RFID STR (SPONGE) ×1 IMPLANT
STAPLER SKIN PROX 35W (STAPLE) ×1 IMPLANT
SUT ETHILON 3-0 (SUTURE) ×1 IMPLANT
SUT MNCRL 4-0 (SUTURE)
SUT MNCRL 4-0 27XMFL (SUTURE)
SUT PDS AB 0 CT1 27 (SUTURE) ×1 IMPLANT
SUT PDS PLUS AB 0 CT-2 (SUTURE) ×2 IMPLANT
SUT PROLENE 0 CT 2 (SUTURE) ×4 IMPLANT
SUT VIC AB 2-0 SH 27 (SUTURE) ×4
SUT VIC AB 2-0 SH 27XBRD (SUTURE) ×3 IMPLANT
SUT VIC AB 3-0 SH 27 (SUTURE) ×2
SUT VIC AB 3-0 SH 27X BRD (SUTURE) ×1 IMPLANT
SUTURE MNCRL 4-0 27XMF (SUTURE) ×1 IMPLANT
SYR 10ML LL (SYRINGE) ×2 IMPLANT
SYR 20ML LL LF (SYRINGE) ×2 IMPLANT
SYR 30ML LL (SYRINGE) ×1 IMPLANT
TRAP FLUID SMOKE EVACUATOR (MISCELLANEOUS) ×2 IMPLANT
TRAY FOLEY MTR SLVR 16FR STAT (SET/KITS/TRAYS/PACK) ×1 IMPLANT
WATER STERILE IRR 500ML POUR (IV SOLUTION) ×2 IMPLANT

## 2023-07-06 NOTE — Transfer of Care (Signed)
Immediate Anesthesia Transfer of Care Note  Patient: Russell Gomez  Procedure(s) Performed: HERNIA REPAIR VENTRAL ADULT INSERTION OF MESH  Patient Location: PACU  Anesthesia Type:General  Level of Consciousness: oriented, drowsy, and patient cooperative  Airway & Oxygen Therapy: Patient Spontanous Breathing and Patient connected to face mask oxygen  Post-op Assessment: Report given to RN and Post -op Vital signs reviewed and stable  Post vital signs: Reviewed and stable  Last Vitals:  Vitals Value Taken Time  BP 139/66 07/06/23 1047  Temp 36.8 C 07/06/23 1047  Pulse 78 07/06/23 1052  Resp 14 07/06/23 1052  SpO2 100 % 07/06/23 1052  Vitals shown include unfiled device data.  Last Pain:  Vitals:   07/06/23 1047  TempSrc:   PainSc: Asleep         Complications: No notable events documented.

## 2023-07-06 NOTE — Interval H&P Note (Signed)
History and Physical Interval Note:  07/06/2023 6:43 AM  Palma Holter  has presented today for surgery, with the diagnosis of K43.2 incisional hernia w/o obstruction or gangrene.  The various methods of treatment have been discussed with the patient and family. After consideration of risks, benefits and other options for treatment, the patient has consented to  Procedure(s): HERNIA REPAIR VENTRAL ADULT (N/A) as a surgical intervention.  The patient's history has been reviewed, patient examined, no change in status, stable for surgery.  I have reviewed the patient's chart and labs.  Questions were answered to the patient's satisfaction.     [name]

## 2023-07-06 NOTE — Op Note (Signed)
Preoperative diagnosis: Ventral (incisional) hernia.  Postoperative diagnosis: Ventral (incisional) incarcerated hernia.  Procedure: Mesh repair of two incisional incarcerated  hernia.                     Bilateral Myocutaneous flaps  Anesthesia: GETA  Surgeon: Dr. Hazle Quant  Wound Classification: Clean  Indications:Patient is a 62 y.o. male developed a ventral hernia in the site of a previous midline and colostomy incision. This was incarcerated and repair was indicated.   Findings: 1. A 5cm x 5 cm midline incisional hernia and a 3 x 3 cm incisional colostomy hernia. Together, the hernia defect was a 12 x 10 cm 2. A 20 cm x 15cm Bard soft mesh used for repair 3. No hollow viscus organ injury identified during procedure 4. Tension free repair achieved 5. Adequate hemostasis  Description of procedure: The patient was brought to the operating room and general anesthesia was induced. A time-out was completed verifying correct patient, procedure, site, positioning, and implant(s) and/or special equipment prior to beginning this procedure. Antibiotics were administered prior to making the incision. The anterior abdominal wall was prepped and draped in the standard sterile fashion. A vertical midline incision incorporating the old incision was made. The old scar was completely excised. The incision was deepened to the fascia. The hernia sac was then identified and dissected free. The peritoneum of the sac was entered and the contents were reduced. The fascia was carefully palpated and additional defects was identified on the previous colostomy site.  Adhesions to the underside of the abdominal wall were lysed and the fascia was assessed. Bilateral myocutaneous flaps were created to release tension to be able to approximate the fascia to the midline. The midline fascia was dissected separating the anterior and posterior fascia creating a posterior component separation to further approximate the  fascia to the midline. This was done from epigastric to suprapubic bilaterally. On the left abdominal wall, the dissection was extended laterally to the hernia of the colostomy site and transversus abdominis release was needed to release tension and approximate the fascia.  The peritoneum and posterior fascia was approximated at midline and closed with a running 2-0 vicryl suture. A piece of 20 cm x 15 cm mesh was placed between the posterior fascia and the rectus muscle and sutured circumferentially to the anterior fascia with multiple PDS 0 interrumpted sutures. The anterior fascia was then closed with a running PDS 0 suture on midline. A closed suction drains were placed on the myocutaneous flaps and retrieved through a different incision. Dead space was irrigated with fibrin sealant subcutaneous tissues were closed with 3-0 Vicryl and the skin was closed with skin staples.  The patient tolerated the procedure well and was brought to the postanesthesia care unit in stable condition.   Specimen: Hernia sac  Complications: None  Estimated Blood Loss: 100 mL

## 2023-07-06 NOTE — Plan of Care (Signed)
  Problem: Coping: Goal: Ability to adjust to condition or change in health will improve Outcome: Progressing   Problem: Skin Integrity: Goal: Risk for impaired skin integrity will decrease Outcome: Progressing   

## 2023-07-06 NOTE — Anesthesia Procedure Notes (Signed)
Procedure Name: Intubation Date/Time: 07/06/2023 7:46 AM  Performed by: Jeannene Patella, CRNAPre-anesthesia Checklist: Patient identified, Emergency Drugs available, Suction available, Patient being monitored and Timeout performed Patient Re-evaluated:Patient Re-evaluated prior to induction Oxygen Delivery Method: Circle system utilized Preoxygenation: Pre-oxygenation with 100% oxygen Induction Type: IV induction Ventilation: Mask ventilation without difficulty and Oral airway inserted - appropriate to patient size Laryngoscope Size: McGraph and 4 Grade View: Grade II Tube size: 7.5 mm Number of attempts: 1 Airway Equipment and Method: Stylet and Video-laryngoscopy Placement Confirmation: ETT inserted through vocal cords under direct vision, positive ETCO2 and breath sounds checked- equal and bilateral Secured at: 22.5 cm Tube secured with: Tape Dental Injury: Teeth and Oropharynx as per pre-operative assessment  Comments: Edentulous
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
## 2023-07-06 NOTE — Inpatient Diabetes Management (Addendum)
Inpatient Diabetes Program Recommendations  AACE/ADA: New Consensus Statement on Inpatient Glycemic Control (2015)  Target Ranges:  Prepandial:   less than 140 mg/dL      Peak postprandial:   less than 180 mg/dL (1-2 hours)      Critically ill patients:  140 - 180 mg/dL   Lab Results  Component Value Date   GLUCAP 417 (H) 07/06/2023   HGBA1C 8.3 (A) 01/26/2023    Review of Glycemic Control  Latest Reference Range & Units 07/06/23 06:18 07/06/23 10:50 07/06/23 11:30  Glucose-Capillary 70 - 99 mg/dL 696 (H) 295 (H) 284 (H)   Diabetes history: DM 1- See's Dr. Gershon Crane (Dx. At age 8) Outpatient Diabetes medications:  NPH 12 units bid Novolin R 2-15 units tid with meals (if glucose>200 mg/dL) Current orders for Inpatient glycemic control:  Novolog 0-15 units q 4 hours Regular insulin 10 units x1 Inpatient Diabetes Program Recommendations:    Call received from PACU due to patient's blood sugar being 417 mg/d.  Patient did not take any insulin this morning and did receive Decadron 5 mg in surgery which likely increased blood sugars as well.  MD ordered Regular insulin 10 units x1 SQ and Regular insulin 10 units x1 IV.    Recommend close monitoring due to insulins given. Discussed Novolog allergy with pharmacy as reaction is listed as "cellulitis".  Asked RN to inquire further and he states that he does not take insulin b/c his blood sugars get "too low" and his baseline is 120-150 mg/dL.  Per pharmacy, this is not a "true allergy" and I noted that MD- Dr. Gershon Crane has had patient on Humalog and Novolog before in the past?   Consider adding Levemir 10 units bid, Novolog "very sensitive" correction tid with meals and HS, and Novolog 2 units tid with meals (hold if patient eats less than 50% or NPO).   Thanks  Beryl Meager, RN, BC-ADM Inpatient Diabetes Coordinator Pager [phone]  (8a-5p)

## 2023-07-07 ENCOUNTER — Encounter: Payer: Self-pay | Admitting: General Surgery

## 2023-07-07 DIAGNOSIS — R739 Hyperglycemia, unspecified: Secondary | ICD-10-CM

## 2023-07-07 DIAGNOSIS — I482 Chronic atrial fibrillation, unspecified: Secondary | ICD-10-CM | POA: Diagnosis not present

## 2023-07-07 DIAGNOSIS — E785 Hyperlipidemia, unspecified: Secondary | ICD-10-CM

## 2023-07-07 DIAGNOSIS — I10 Essential (primary) hypertension: Secondary | ICD-10-CM

## 2023-07-07 DIAGNOSIS — K432 Incisional hernia without obstruction or gangrene: Secondary | ICD-10-CM

## 2023-07-07 LAB — GLUCOSE, CAPILLARY
Glucose-Capillary: 249 mg/dL — ABNORMAL HIGH (ref 70–99)
Glucose-Capillary: 224 mg/dL — ABNORMAL HIGH (ref 70–99)
Glucose-Capillary: 104 mg/dL — ABNORMAL HIGH (ref 70–99)
Glucose-Capillary: 190 mg/dL — ABNORMAL HIGH (ref 70–99)
Glucose-Capillary: 272 mg/dL — ABNORMAL HIGH (ref 70–99)

## 2023-07-07 LAB — BASIC METABOLIC PANEL
Anion gap: 6 (ref 5–15)
BUN: 20 mg/dL (ref 6–20)
CO2: 22 mmol/L (ref 22–32)
Calcium: 8.3 mg/dL — ABNORMAL LOW (ref 8.9–10.3)
Chloride: 106 mmol/L (ref 98–111)
Creatinine, Ser: 0.75 mg/dL (ref 0.61–1.24)
GFR, Estimated: >60 mL/min (ref >60)
Glucose, Bld: 286 mg/dL — ABNORMAL HIGH (ref 70–99)
Potassium: 4.2 mmol/L (ref 3.5–5.1)
Sodium: 134 mmol/L — ABNORMAL LOW (ref 135–145)

## 2023-07-07 LAB — CBC
HCT: 38.9 % — ABNORMAL LOW (ref 39.0–52.0)
Hemoglobin: 13.1 g/dL (ref 13.0–17.0)
MCH: 31 pg (ref 26.0–34.0)
MCHC: 33.7 g/dL (ref 30.0–36.0)
MCV: 92 fL (ref 80.0–100.0)
Platelets: 275 10*3/uL (ref 150–400)
RBC: 4.23 MIL/uL (ref 4.22–5.81)
RDW: 13.9 % (ref 11.5–15.5)
WBC: 14.7 10*3/uL — ABNORMAL HIGH (ref 4.0–10.5)
nRBC: 0 % (ref 0.0–0.2)

## 2023-07-07 MED ORDER — PANTOPRAZOLE SODIUM 40 MG PO TBEC
DELAYED_RELEASE_TABLET | ORAL | Status: AC
  Filled 2023-07-07: qty 1

## 2023-07-07 MED ORDER — GABAPENTIN 300 MG PO CAPS
ORAL_CAPSULE | ORAL | Status: AC
  Filled 2023-07-07: qty 1

## 2023-07-07 MED ORDER — HYDROCODONE-ACETAMINOPHEN 5-325 MG PO TABS
ORAL_TABLET | ORAL | Status: AC
  Filled 2023-07-07: qty 2

## 2023-07-07 MED ORDER — INSULIN ASPART 100 UNIT/ML IJ SOLN
4.0000 [IU] | Freq: Three times a day (TID) | INTRAMUSCULAR | Status: DC
  Administered 2023-07-07: 4 [IU] via SUBCUTANEOUS

## 2023-07-07 MED ORDER — INSULIN ASPART 100 UNIT/ML IJ SOLN: 0.0000 [IU] | Freq: Every day | INTRAMUSCULAR | Status: DC

## 2023-07-07 MED ORDER — CELECOXIB 200 MG PO CAPS
ORAL_CAPSULE | ORAL | Status: AC
  Filled 2023-07-07: qty 1

## 2023-07-07 MED ORDER — METOPROLOL TARTRATE 25 MG PO TABS
ORAL_TABLET | ORAL | Status: AC
  Filled 2023-07-07: qty 2

## 2023-07-07 MED ORDER — INSULIN ASPART 100 UNIT/ML IJ SOLN
INTRAMUSCULAR | Status: AC
  Filled 2023-07-07: qty 1

## 2023-07-07 MED ORDER — INSULIN DETEMIR 100 UNIT/ML ~~LOC~~ SOLN: 10.0000 [IU] | Freq: Every day | SUBCUTANEOUS | Status: DC

## 2023-07-07 MED ORDER — HYDROCODONE-ACETAMINOPHEN 5-325 MG PO TABS
ORAL_TABLET | ORAL | Status: AC
  Filled 2023-07-07: qty 1

## 2023-07-07 MED ORDER — INSULIN DETEMIR 100 UNIT/ML ~~LOC~~ SOLN
12.0000 [IU] | Freq: Two times a day (BID) | SUBCUTANEOUS | Status: DC
  Administered 2023-07-07: 12 [IU] via SUBCUTANEOUS
  Filled 2023-07-07 (×2): qty 0.12

## 2023-07-07 MED ORDER — INSULIN ASPART 100 UNIT/ML IJ SOLN
0.0000 [IU] | Freq: Three times a day (TID) | INTRAMUSCULAR | Status: DC
  Administered 2023-07-07: 5 [IU] via SUBCUTANEOUS

## 2023-07-07 MED ORDER — ENOXAPARIN SODIUM 40 MG/0.4ML IJ SOSY
PREFILLED_SYRINGE | INTRAMUSCULAR | Status: AC
  Filled 2023-07-07: qty 0.4

## 2023-07-07 MED ORDER — APIXABAN 2.5 MG PO TABS: 5.0000 mg | ORAL_TABLET | Freq: Two times a day (BID) | ORAL | Status: DC

## 2023-07-07 NOTE — Assessment & Plan Note (Signed)
BP stable  Cont home metoprolol

## 2023-07-07 NOTE — Plan of Care (Signed)
  Problem: Fluid Volume: Goal: Ability to maintain a balanced intake and output will improve Outcome: Progressing   Problem: Health Behavior/Discharge Planning: Goal: Ability to manage health-related needs will improve Outcome: Progressing   Problem: Nutritional: Goal: Maintenance of adequate nutrition will improve Outcome: Progressing   Problem: Tissue Perfusion: Goal: Adequacy of tissue perfusion will improve Outcome: Progressing

## 2023-07-07 NOTE — Assessment & Plan Note (Signed)
Rate controlled at present  Eliquis being held at present in the perioperative window per surgery recommendations

## 2023-07-07 NOTE — Assessment & Plan Note (Signed)
Cont pravachol

## 2023-07-07 NOTE — Assessment & Plan Note (Signed)
Pt denies any active use  Abstinent since 06/2021 per report  Follow

## 2023-07-07 NOTE — Assessment & Plan Note (Addendum)
Blood sugars 400s-->200s in postoperative period with noted decadron use intraoperatively  Started on long acting insulin last night w/ lantus 10u BID.  Blood sugars heading in the right direction though still elevated  Will increase SSI  Increase lantus to 12 u BID  Follow closely

## 2023-07-07 NOTE — Assessment & Plan Note (Signed)
Protonix.  ?

## 2023-07-07 NOTE — Inpatient Diabetes Management (Signed)
Inpatient Diabetes Program Recommendations  AACE/ADA: New Consensus Statement on Inpatient Glycemic Control (2015)  Target Ranges:  Prepandial:   less than 140 mg/dL      Peak postprandial:   less than 180 mg/dL (1-2 hours)      Critically ill patients:  140 - 180 mg/dL   Lab Results  Component Value Date   GLUCAP 249 (H) 07/07/2023   HGBA1C 8.3 (A) 01/26/2023    Review of Glycemic Control  Latest Reference Range & Units 07/06/23 13:27 07/06/23 15:57 07/06/23 17:14 07/06/23 21:38 07/07/23 07:38  Glucose-Capillary 70 - 99 mg/dL 161 (H) 096 (H) 045 (H) 255 (H) 249 (H)  Outpatient Diabetes medications:  NPH 12 units bid Novolin R 2-15 units tid with meals (if glucose>200 mg/dL) Current orders for Inpatient glycemic control:  Novolog 0-9 units tid with meals and HS Levemir 10 units bid Novolog 2 units tid with meals  Inpatient Diabetes Program Recommendations:    Spoke at length to patient and wife this morning regarding DM management.  Explained what happened yesterday and insulins that were given as well as the Decadron 5 mg which likely increased blood sugars.  Patient states that he was concerned that he was not getting enough insulin for meals.  We discussed that he got 10 units regular insulin IV and 10 units Regular insulin SQ yesterday plus ordered insulins.  He also did not take his long acting insulin yesterday morning.    Patient agreed that he needs more meal coverage and more long-acting insulin.  He states that he understands what happened yesterday and feels better.  Explained that if he has questions regarding insulin doses, MD can always be called but we do not want to over correct and cause lows either.   Consider increasing Levemir to 12 units bid and increase Novolog meal coverage to 4 units tid with meals.  Note discussed Aspart allergy with patient and he states it "brings him down too fast sometimes".  He has been on this in the past with no "allergic reaction".   Discussed with pharmacy yesterday as well.    Thanks,  Beryl Meager, RN, BC-ADM Inpatient Diabetes Coordinator Pager [phone]  (8a-5p)

## 2023-07-07 NOTE — Progress Notes (Signed)
Patient ID: Russell Gomez, male   DOB: November 15, 1960, 62 y.o.   MRN: [id number]     SURGICAL PROGRESS NOTE   Hospital Day(s): 1.   Interval History: Patient seen and examined, no acute events or new complaints overnight. Patient reports feeling okay this morning.  He is concerned that his diabetes is not being managed the appropriate way.  He understand that even with very elevated glucose over 200 he is just receiving 2 units of insulin.  He feels that he will control his diabetes better at home.  Vital signs in last 24 hours: [min-max] current  Temp:  [97.7 F (36.5 C)-99.2 F (37.3 C)] 98.1 F (36.7 C) (08/29 0741) Pulse Rate:  [65-88] 65 (08/29 0741) Resp:  [13-18] 18 (08/29 0741) BP: (127-146)/(58-85) 131/69 (08/29 0741) SpO2:  [93 %-100 %] 95 % (08/29 0741)     Height: 6\' 2"  (188 cm) Weight: 95.3 kg BMI (Calculated): 26.96   Physical Exam:  Constitutional: alert, cooperative and no distress  Respiratory: breathing non-labored at rest  Cardiovascular: regular rate and sinus rhythm  Gastrointestinal: soft, non-tender, and non-distended.  Wound covered with Prevena wound VAC  Labs:     Latest Ref Rng & Units 07/07/2023    6:15 AM 06/17/2023    8:24 PM 03/01/2023   10:18 AM  CBC  WBC 4.0 - 10.5 K/uL 14.7  8.7  10.2   Hemoglobin 13.0 - 17.0 g/dL 86.5  78.4  69.6   Hematocrit 39.0 - 52.0 % 38.9  41.4  42.1   Platelets 150 - 400 K/uL 275  307  303       Latest Ref Rng & Units 07/07/2023    6:15 AM 06/17/2023    8:24 PM 03/01/2023   10:18 AM  CMP  Glucose 70 - 99 mg/dL 295  284  132   BUN 6 - 20 mg/dL 20  21  16    Creatinine 0.61 - 1.24 mg/dL 4.40  1.02  7.25   Sodium 135 - 145 mmol/L 134  135  138   Potassium 3.5 - 5.1 mmol/L 4.2  3.8  4.1   Chloride 98 - 111 mmol/L 106  100  102   CO2 22 - 32 mmol/L 22  25  28    Calcium 8.9 - 10.3 mg/dL 8.3  9.0  8.7   Total Protein 6.5 - 8.1 g/dL  6.7    Total Bilirubin 0.3 - 1.2 mg/dL  0.9    Alkaline Phos 38 - 126 U/L  61    AST 15 - 41  U/L  17    ALT 0 - 44 U/L  16      Imaging studies: No new pertinent imaging studies   Assessment/Plan:  62 y.o. male with incisional hernia 1 Day Post-Op s/p open repair with left-sided thyroid and right retromuscular approach, complicated by pertinent comorbidities including uncontrolled diabetes.  -Patient with stable vital signs.  Adequate pain control. -Patient was not able to ambulate yesterday.  He is very willing to ambulate today -Will advance diet to diabetic diet -Consult hospitalist for help with managing diabetes.  Currently being managed by diabetic coordinator but still with uncontrolled diabetes -Will continue to monitor drainage output.  Total output was 110 cc in last 24 hours. -Will restart blood thinner.  Gae Gallop, MD

## 2023-07-07 NOTE — Assessment & Plan Note (Signed)
Smokes 1-2 cigarettes per week per patient
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
## 2023-07-07 NOTE — Assessment & Plan Note (Signed)
S/p Mesh repair of two incisional incarcerated  hernia 8/28 w/ general surgery  Management per primary team

## 2023-07-08 LAB — CBC
HCT: 37.9 % — ABNORMAL LOW (ref 39.0–52.0)
Hemoglobin: 13 g/dL (ref 13.0–17.0)
MCH: 31.2 pg (ref 26.0–34.0)
MCHC: 34.3 g/dL (ref 30.0–36.0)
MCV: 90.9 fL (ref 80.0–100.0)
Platelets: 284 10*3/uL (ref 150–400)
RBC: 4.17 MIL/uL — ABNORMAL LOW (ref 4.22–5.81)
RDW: 13.6 % (ref 11.5–15.5)
WBC: 10.3 10*3/uL (ref 4.0–10.5)
nRBC: 0 % (ref 0.0–0.2)

## 2023-07-08 LAB — BASIC METABOLIC PANEL
Anion gap: 17 — ABNORMAL HIGH (ref 5–15)
BUN: 21 mg/dL — ABNORMAL HIGH (ref 6–20)
CO2: 25 mmol/L (ref 22–32)
Calcium: 9.2 mg/dL (ref 8.9–10.3)
Chloride: 100 mmol/L (ref 98–111)
Creatinine, Ser: 0.78 mg/dL (ref 0.61–1.24)
GFR, Estimated: >60 mL/min (ref >60)
Glucose, Bld: 154 mg/dL — ABNORMAL HIGH (ref 70–99)
Potassium: 3.9 mmol/L (ref 3.5–5.1)
Sodium: 137 mmol/L (ref 135–145)

## 2023-07-08 LAB — GLUCOSE, CAPILLARY: Glucose-Capillary: 99 mg/dL (ref 70–99)

## 2023-07-08 MED ORDER — INSULIN ASPART 100 UNIT/ML IJ SOLN
INTRAMUSCULAR | Status: AC
  Filled 2023-07-08: qty 1

## 2023-07-08 MED ORDER — HYDROCODONE-ACETAMINOPHEN 5-325 MG PO TABS: 1.0000 | ORAL_TABLET | ORAL | 0 refills | Status: DC | PRN

## 2023-07-08 NOTE — Progress Notes (Signed)
DISCHARGE NOTE:  Pt given discharge instructions and verbalized understanding. Pt wheeled to car by staff, family providing transportation.

## 2023-07-08 NOTE — Plan of Care (Signed)
Documented

## 2023-07-08 NOTE — Discharge Instructions (Signed)

## 2023-07-08 NOTE — Plan of Care (Signed)
  Problem: Skin Integrity: Goal: Risk for impaired skin integrity will decrease Outcome: Progressing   Problem: Activity: Goal: Risk for activity intolerance will decrease Outcome: Progressing   Problem: Nutrition: Goal: Adequate nutrition will be maintained Outcome: Progressing   Problem: Pain Managment: Goal: General experience of comfort will improve Outcome: Progressing

## 2023-07-08 NOTE — Discharge Summary (Signed)
Patient ID: VENARD LESCARBEAU MRN: [id number] DOB/AGE: [birth year] 61 y.o.  Admit date: 07/06/2023 Discharge date: 07/08/2023   Discharge Diagnoses:  Principal Problem:   Incisional hernia Active Problems:   Hyperlipidemia due to type 1 diabetes mellitus (HCC)   TOBACCO ABUSE   Essential hypertension   GERD   History of alcohol abuse   Atrial fibrillation, chronic (HCC)   Procedures: Incisional hernia repair with bilateral myocutaneous flaps  Hospital Course: Patient admitted for surgical management of large incisional hernia.  He underwent open incisional hernia repair with bilateral myocutaneous flap.  Patient has been doing well.  Pain controlled.  Slowly controlling glucose.  Patient endorses that he can control his glucose better at home.  Drain decreased to 50 cc in last 24 hours.  Serosanguineous.  Due to increased risk of infection due to his diabetes and smoking I will remove drain.  Patient ambulating.  Patient tolerating diet.  Physical Exam Vitals reviewed.  Constitutional:      Appearance: Normal appearance.  HENT:     Head: Normocephalic.  Cardiovascular:     Rate and Rhythm: Normal rate and regular rhythm.     Pulses: Normal pulses.  Pulmonary:     Effort: Pulmonary effort is normal.     Breath sounds: Normal breath sounds.  Abdominal:     General: Abdomen is flat. Bowel sounds are normal. There is no distension.     Palpations: Abdomen is soft.     Tenderness: There is no abdominal tenderness.  Musculoskeletal:     Cervical back: Normal range of motion.  Skin:    Capillary Refill: Capillary refill takes less than 2 seconds.  Neurological:     General: No focal deficit present.     Mental Status: He is alert and oriented to person, place, and time.   Midline wound dry and clean.    Consults: Hospitalist.   Disposition: Discharge disposition: 01-Home or Self Care       Discharge Instructions     Diet - low sodium heart healthy   Complete by: As  directed    Diet - low sodium heart healthy   Complete by: As directed    Increase activity slowly   Complete by: As directed    Increase activity slowly   Complete by: As directed       Allergies as of 07/08/2023       Reactions   Spironolactone Nausea Only, Rash   Hydrochlorothiazide Itching, Rash   Morphine Other (See Comments)   "MAKES ME MEAN"        Medication List     TAKE these medications    apixaban 5 MG Tabs tablet Commonly known as: Eliquis TAKE 1 TABLET (5MG  TOTAL) BY MOUTH 2 TIMES A DAY. What changed:  how much to take how to take this when to take this additional instructions   cyanocobalamin 1000 MCG/ML injection Commonly known as: VITAMIN B12 Inject 1 mL (1,000 mcg total) into the muscle every 30 (thirty) days.   HYDROcodone-acetaminophen 5-325 MG tablet Commonly known as: NORCO/VICODIN Take 1 tablet by mouth every 4 (four) hours as needed for moderate pain.   insulin NPH Human 100 UNIT/ML injection Commonly known as: NOVOLIN N Inject 0.12 mLs (12 Units total) into the skin 2 (two) times daily before a meal.   insulin regular 100 units/mL injection Commonly known as: NOVOLIN R Inject 2-15 Units into the skin 3 (three) times daily before meals.   magnesium oxide 400 (240 Mg) MG  tablet Commonly known as: MAG-OX Take 1 tablet (400 mg total) by mouth daily.   metoprolol tartrate 50 MG tablet Commonly known as: LOPRESSOR Take 1 tablet (50 mg total) by mouth 2 (two) times daily.   pantoprazole 40 MG tablet Commonly known as: PROTONIX Take 1 tablet (40 mg total) by mouth at bedtime.   pravastatin 80 MG tablet Commonly known as: PRAVACHOL TAKE 1 TABLET(80 MG) BY MOUTH DAILY   torsemide 20 MG tablet Commonly known as: DEMADEX Take 1 tablet (20 mg total) by mouth daily.        Follow-up Information     [name], MD Follow up in 2 week(s).   Specialty: General Surgery Contact information: [address]  [address] [place] [address] [phone]

## 2023-07-10 DIAGNOSIS — R410 Disorientation, unspecified: Secondary | ICD-10-CM | POA: Diagnosis not present

## 2023-07-10 DIAGNOSIS — E162 Hypoglycemia, unspecified: Secondary | ICD-10-CM | POA: Diagnosis not present

## 2023-07-10 DIAGNOSIS — E161 Other hypoglycemia: Secondary | ICD-10-CM | POA: Diagnosis not present

## 2023-07-12 ENCOUNTER — Telehealth: Payer: Self-pay

## 2023-07-12 NOTE — Transitions of Care (Post Inpatient/ED Visit) (Signed)
07/12/2023  Name: Russell Gomez MRN: [id number] DOB: 1961/03/08  Today's TOC FU Call Status: Today's TOC FU Call Status:: Successful TOC FU Call Completed TOC FU Call Complete Date: 07/12/23 Patient's Name and Date of Birth confirmed.  Transition Care Management Follow-up Telephone Call Date of Discharge: 07/08/23 Discharge Facility: [facility]) Type of Discharge: Inpatient Admission Primary Inpatient Discharge Diagnosis:: Incisional Hernia Repair How have you been since you were released from the hospital?: Better Any questions or concerns?: No  Items Reviewed: Did you receive and understand the discharge instructions provided?: Yes Medications obtained,verified, and reconciled?: Yes (Medications Reviewed) Any new allergies since your discharge?: No Dietary orders reviewed?: No Do you have support at home?: Yes People in Home: spouse Name of Support/Comfort Primary Source: Lawson Fiscal  Medications Reviewed Today: Medications Reviewed Today     Reviewed by Jodelle Gross, RN (Case Manager) on 07/12/23 at 1342  Med List Status: <None>   Medication Order Taking? Sig Documenting Provider Last Dose Status Informant  apixaban (ELIQUIS) 5 MG TABS tablet [id number] Yes TAKE 1 TABLET (5MG  TOTAL) BY MOUTH 2 TIMES A DAY.  Patient taking differently: Take 5 mg by mouth 2 (two) times daily.   Russell Iba, MD Taking Active   cyanocobalamin (,VITAMIN B-12,) 1000 MCG/ML injection [id number] No Inject 1 mL (1,000 mcg total) into the muscle every 30 (thirty) days.  Patient not taking: Reported on 06/30/2023   Russell Boyden, MD Not Taking Active   HYDROcodone-acetaminophen (NORCO/VICODIN) 5-325 MG tablet [id number] Yes Take 1 tablet by mouth every 4 (four) hours as needed for moderate pain. [name], MD Taking Active   insulin NPH Human (NOVOLIN N) 100 UNIT/ML injection [id number] Yes Inject 0.12 mLs (12 Units total) into the skin 2 (two) times daily  before a meal. Russell Boyden, MD Taking Active   insulin regular (NOVOLIN R) 100 units/mL injection [id number] Yes Inject 2-15 Units into the skin 3 (three) times daily before meals. [provider] Taking Active Self           Med Note Excell Seltzer, Russell Gomez   Fri Aug 13, 2022  6:32 PM) Forde Radon be over 200  magnesium oxide (MAG-OX) 400 (240 Mg) MG tablet [id number] Yes Take 1 tablet (400 mg total) by mouth daily. Russell Boyden, MD Taking Active   metoprolol tartrate (LOPRESSOR) 50 MG tablet [id number] Yes Take 1 tablet (50 mg total) by mouth 2 (two) times daily. [name], PA-C Taking Active   pantoprazole (PROTONIX) 40 MG tablet [id number] Yes Take 1 tablet (40 mg total) by mouth at bedtime. Russell Boyden, MD Taking Active   pravastatin (PRAVACHOL) 80 MG tablet [id number] Yes TAKE 1 TABLET(80 MG) BY MOUTH DAILY Russell Boyden, MD Taking Active   torsemide (DEMADEX) 20 MG tablet [id number] Yes Take 1 tablet (20 mg total) by mouth daily. Russell Boyden, MD Taking Active             Home Care and Equipment/Supplies: Were Home Health Services Ordered?: No Any new equipment or medical supplies ordered?: No  Functional Questionnaire: Do you need assistance with bathing/showering or dressing?: No Do you need assistance with meal preparation?: No Do you need assistance with eating?: No Do you have difficulty maintaining continence: No Do you need assistance with getting out of bed/getting out of a chair/moving?: No Do you have difficulty managing or taking your medications?: No  Follow up appointments reviewed: PCP Follow-up appointment confirmed?: Yes Date of PCP follow-up appointment?: 07/21/23 Follow-up Provider: Ardeen Gomez  [facility] Follow-up appointment confirmed?: Yes Date of Specialist follow-up appointment?: 07/22/23 Follow-Up Specialty Provider:: Dr. Trisha Mangle (surgeon) Do you need transportation to your follow-up appointment?: No Do you understand care  options if your condition(s) worsen?: Yes-patient verbalized understanding  SDOH Interventions Today    Flowsheet Row Most Recent Value  SDOH Interventions   Food Insecurity Interventions Intervention Not Indicated  Housing Interventions Intervention Not Indicated  Transportation Interventions Intervention Not Indicated  Utilities Interventions Intervention Not Indicated     Jodelle Gross RN, BSN, CCM [facility] RN Care Coordinator/ Transitions of Care Direct Dial: [phone]  Fax: [phone]

## 2023-07-14 NOTE — Anesthesia Postprocedure Evaluation (Signed)
Anesthesia Post Note  Patient: Russell Gomez  Procedure(s) Performed: HERNIA REPAIR VENTRAL ADULT INSERTION OF MESH  Patient location during evaluation: PACU Anesthesia Type: General Level of consciousness: awake and alert Pain management: pain level controlled Vital Signs Assessment: post-procedure vital signs reviewed and stable Respiratory status: spontaneous breathing, nonlabored ventilation, respiratory function stable and patient connected to nasal cannula oxygen Cardiovascular status: blood pressure returned to baseline and stable Postop Assessment: no apparent nausea or vomiting Anesthetic complications: no   No notable events documented.   Last Vitals:  Vitals:   07/08/23 0821 07/08/23 0844  BP: (!) 165/75 (!) 147/66  Pulse: (!) 44 60  Resp:  18  Temp:  36.7 C  SpO2:  98%    Last Pain:  Vitals:   07/08/23 0844  TempSrc: Oral  PainSc: 0-No pain                 Lenard Simmer

## 2023-07-18 ENCOUNTER — Other Ambulatory Visit: Payer: Self-pay | Admitting: Family Medicine

## 2023-07-18 DIAGNOSIS — E559 Vitamin D deficiency, unspecified: Secondary | ICD-10-CM

## 2023-07-18 DIAGNOSIS — R946 Abnormal results of thyroid function studies: Secondary | ICD-10-CM

## 2023-07-18 DIAGNOSIS — E108 Type 1 diabetes mellitus with unspecified complications: Secondary | ICD-10-CM

## 2023-07-18 DIAGNOSIS — D539 Nutritional anemia, unspecified: Secondary | ICD-10-CM

## 2023-07-18 DIAGNOSIS — E1069 Type 1 diabetes mellitus with other specified complication: Secondary | ICD-10-CM

## 2023-07-18 DIAGNOSIS — E519 Thiamine deficiency, unspecified: Secondary | ICD-10-CM

## 2023-07-18 DIAGNOSIS — E538 Deficiency of other specified B group vitamins: Secondary | ICD-10-CM

## 2023-07-18 DIAGNOSIS — E213 Hyperparathyroidism, unspecified: Secondary | ICD-10-CM

## 2023-07-21 ENCOUNTER — Other Ambulatory Visit: Payer: BC Managed Care – PPO

## 2023-07-29 ENCOUNTER — Encounter: Payer: BC Managed Care – PPO | Admitting: Family Medicine

## 2023-08-30 ENCOUNTER — Other Ambulatory Visit: Payer: Self-pay | Admitting: Physician Assistant

## 2023-08-30 ENCOUNTER — Telehealth: Payer: Self-pay | Admitting: Cardiovascular Disease

## 2023-08-30 NOTE — Telephone Encounter (Signed)
Left voice mail to schedule appt

## 2023-08-30 NOTE — Telephone Encounter (Signed)
last appt 03/02/23-f/u 6 months  next appt none  Please schedule f/u.  Thanks

## 2023-08-30 NOTE — Telephone Encounter (Signed)
 Left voice mail, pt needs appt scheduled from recall.

## 2023-08-31 ENCOUNTER — Telehealth: Payer: Self-pay

## 2023-08-31 DIAGNOSIS — E785 Hyperlipidemia, unspecified: Secondary | ICD-10-CM

## 2023-08-31 MED ORDER — PRAVASTATIN SODIUM 80 MG PO TABS: ORAL_TABLET | ORAL | 0 refills | Status: DC

## 2023-08-31 NOTE — Telephone Encounter (Signed)
E-scribed pravastatin refill.  Plz schedule CPE and fasting lab (no food/drink- except water and/or blk coffee 5 hrs prior) visits for additional refills.

## 2023-08-31 NOTE — Telephone Encounter (Signed)
LVMTCB and schedule

## 2023-09-01 ENCOUNTER — Encounter: Payer: Self-pay | Admitting: Cardiovascular Disease

## 2023-09-01 NOTE — Telephone Encounter (Signed)
Unable to reach letter sent to patient via mail.

## 2023-09-01 NOTE — Telephone Encounter (Signed)
Patient has been scheduled

## 2023-09-01 NOTE — Telephone Encounter (Signed)
Left voice mail to schedule appt

## 2023-09-01 NOTE — Telephone Encounter (Signed)
Noted  

## 2023-09-08 ENCOUNTER — Other Ambulatory Visit: Payer: Self-pay | Admitting: Family Medicine

## 2023-09-08 DIAGNOSIS — K219 Gastro-esophageal reflux disease without esophagitis: Secondary | ICD-10-CM
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
## 2023-10-17 ENCOUNTER — Encounter: Payer: Self-pay | Admitting: Cardiovascular Disease

## 2023-10-17 ENCOUNTER — Ambulatory Visit: Payer: BC Managed Care – PPO | Attending: Cardiovascular Disease | Admitting: Cardiovascular Disease

## 2023-10-17 VITALS — BP 130/60 | HR 78 | Ht 74.0 in | Wt 222.4 lb

## 2023-10-17 DIAGNOSIS — E785 Hyperlipidemia, unspecified: Secondary | ICD-10-CM

## 2023-10-17 DIAGNOSIS — I700 Atherosclerosis of aorta: Secondary | ICD-10-CM

## 2023-10-17 DIAGNOSIS — E0590 Thyrotoxicosis, unspecified without thyrotoxic crisis or storm: Secondary | ICD-10-CM

## 2023-10-17 DIAGNOSIS — I480 Paroxysmal atrial fibrillation: Secondary | ICD-10-CM

## 2023-10-17 DIAGNOSIS — I10 Essential (primary) hypertension: Secondary | ICD-10-CM

## 2023-10-17 DIAGNOSIS — E108 Type 1 diabetes mellitus with unspecified complications: Secondary | ICD-10-CM

## 2023-10-17 DIAGNOSIS — Z87898 Personal history of other specified conditions: Secondary | ICD-10-CM

## 2023-10-17 DIAGNOSIS — Z720 Tobacco use: Secondary | ICD-10-CM

## 2023-10-17 MED ORDER — METOPROLOL TARTRATE 50 MG PO TABS: 50.0000 mg | ORAL_TABLET | Freq: Two times a day (BID) | ORAL | 3 refills | Status: DC

## 2023-10-17 NOTE — Patient Instructions (Signed)

## 2023-11-10 DIAGNOSIS — I152 Hypertension secondary to endocrine disorders: Secondary | ICD-10-CM | POA: Diagnosis not present

## 2023-11-10 DIAGNOSIS — E782 Mixed hyperlipidemia: Secondary | ICD-10-CM | POA: Diagnosis not present

## 2023-11-10 DIAGNOSIS — E10649 Type 1 diabetes mellitus with hypoglycemia without coma: Secondary | ICD-10-CM | POA: Diagnosis not present

## 2023-11-10 DIAGNOSIS — E1069 Type 1 diabetes mellitus with other specified complication: Secondary | ICD-10-CM | POA: Diagnosis not present

## 2023-11-15 ENCOUNTER — Other Ambulatory Visit: Payer: Self-pay

## 2023-11-15 ENCOUNTER — Emergency Department
Admission: EM | Admit: 2023-11-15 | Discharge: 2023-11-15 | Disposition: A | Discharge status: Home / Self Care | Payer: BC Managed Care – PPO | Attending: Student in an Organized Health Care Education/Training Program | Admitting: Student in an Organized Health Care Education/Training Program

## 2023-11-15 DIAGNOSIS — E11649 Type 2 diabetes mellitus with hypoglycemia without coma: Secondary | ICD-10-CM | POA: Diagnosis not present

## 2023-11-15 DIAGNOSIS — E162 Hypoglycemia, unspecified: Secondary | ICD-10-CM | POA: Insufficient documentation

## 2023-11-15 DIAGNOSIS — R110 Nausea: Secondary | ICD-10-CM | POA: Diagnosis not present

## 2023-11-15 DIAGNOSIS — Z794 Long term (current) use of insulin: Secondary | ICD-10-CM | POA: Insufficient documentation

## 2023-11-15 LAB — BASIC METABOLIC PANEL
Anion gap: 8 (ref 5–15)
BUN: 15 mg/dL (ref 8–23)
CO2: 23 mmol/L (ref 22–32)
Calcium: 8.5 mg/dL — ABNORMAL LOW (ref 8.9–10.3)
Chloride: 105 mmol/L (ref 98–111)
Creatinine, Ser: 0.74 mg/dL (ref 0.61–1.24)
GFR, Estimated: >60 mL/min (ref >60)
Glucose, Bld: 79 mg/dL (ref 70–99)
Potassium: 3.9 mmol/L (ref 3.5–5.1)
Sodium: 136 mmol/L (ref 135–145)

## 2023-11-15 LAB — CBG MONITORING, ED: Glucose-Capillary: 82 mg/dL (ref 70–99)

## 2023-11-15 NOTE — ED Provider Triage Note (Signed)
 Emergency Medicine Provider Triage Evaluation Note  DONSHAY LUPINSKI , a 63 y.o. male  was evaluated in triage.  Pt complains of low blood sugar. He started a new insulin  Saturday night. He called EMS because his monitor reading 42. EMS glucometer read 62. He has had multiple high carbohydrate foods and fluids today. Currently no concerning symptoms.   Physical Exam  BP (!) 165/100 (BP Location: Left Arm)   Pulse 71   Temp 98.5 F (36.9 C) (Oral)   Resp 16   Ht 6' 2 (1.88 m)   Wt 100.7 kg   SpO2 99%   BMI 28.50 kg/m  Gen:   Awake, no distress   Resp:  Normal effort  MSK:   Moves extremities without difficulty  Other:    Medical Decision Making  Medically screening exam initiated at 1:05 PM.  Appropriate orders placed.  Lynwood JONETTA Stacks was informed that the remainder of the evaluation will be completed by another provider, this initial triage assessment does not replace that evaluation, and the importance of remaining in the ED until their evaluation is complete.  Basic labs sent.   Herlinda Kirk NOVAK, FNP 11/15/23 1308

## 2023-11-15 NOTE — ED Triage Notes (Signed)
 Pt c/o hypoglycemia with glucose reading 40-60 per EMS. Pt started new insulin 4 days ago and has not been able to keep his glucose up since

## 2023-11-15 NOTE — ED Provider Notes (Signed)
 [facility] Provider Note  Patient Contact: 6:38 PM (approximate)   History   Hypoglycemia   HPI  Russell Gomez is a 63 y.o. male who presents to the emergency department complaining about hypoglycemia over the last 4 days.  Patient was just switched to a new insulin .  Patient states that he has been having difficulties with his sugar being in the 40s to 60s.  Patient currently feels okay, states that when it starts to fall he becomes diaphoretic and nauseous.  Patient's glucose is 86 on arrival, 75 on the patient's glucometer.  No other complaints currently.  No recent illnesses.  Patient notes that he was switched to long acting insulin  4 days ago and has been noticing that every time he uses his long-acting insulin  he is dropping sugars into the 40s to 60s.  Patient states that he is currently on 12 units of long-acting.  He has a fine-tune scale of the short acting.  He states that of big concern is when he eats meals that he states is even relatively high in carbs or glucose his sugars are following relatively quickly.  Patient states that he ate a sandwich with bologna and cheese, chips, had a sugar containing beverage, piece of pancake and his sugar still fell to the 40s.     Physical Exam   Triage Vital Signs: ED Triage Vitals [11/15/23 1300]  Encounter Vitals Group     BP (!) 165/100     Systolic BP Percentile      Diastolic BP Percentile      Pulse Rate 71     Resp 16     Temp 98.5 F (36.9 C)     Temp Source Oral     SpO2 99 %     Weight 222 lb (100.7 kg)     Height 6' 2 (1.88 m)     Head Circumference      Peak Flow      Pain Score 0     Pain Loc      Pain Education      Exclude from Growth Chart     Most recent vital signs: Vitals:   11/15/23 1300 11/15/23 1719  BP: (!) 165/100 (!) 160/90  Pulse: 71 70  Resp: 16 16  Temp: 98.5 F (36.9 C) 98 F (36.7 C)  SpO2: 99% 99%     General: Alert and in no acute distress. Eyes:   PERRL. EOMI.  Neck: No stridor.  Cardiovascular:  Good peripheral perfusion Respiratory: Normal respiratory effort without tachypnea or retractions. Lungs CTAB. Good air entry to the bases with no decreased or absent breath sounds Gastrointestinal: Bowel sounds 4 quadrants. Soft and nontender to palpation. No guarding or rigidity. No palpable masses. No distention. No CVA tenderness. Musculoskeletal: Full range of motion to all extremities.  Neurologic:  No gross focal neurologic deficits are appreciated.  Skin:   No rash noted Other:   ED Results / Procedures / Treatments   Labs (all labs ordered are listed, but only abnormal results are displayed) Labs Reviewed  BASIC METABOLIC PANEL - Abnormal; Notable for the following components:      Result Value   Calcium  8.5 (*)    All other components within normal limits  CBG MONITORING, ED     EKG     RADIOLOGY    No results found.  PROCEDURES:  Critical Care performed: No  Procedures   MEDICATIONS ORDERED IN ED: Medications - No data  to display   IMPRESSION / MDM / ASSESSMENT AND PLAN / ED COURSE  I reviewed the triage vital signs and the nursing notes.                                 Differential diagnosis includes, but is not limited to, hypoglycemia, DKA, viral illness   Patient's presentation is most consistent with acute presentation with potential threat to life or bodily function.   Patient's diagnosis is consistent with hypoglycemia.  Patient is having difficulties maintaining his blood glucose after switching to long-acting insulin .  Patient states that he is taking 12 units every 12 hours.  He states that even eating high glycemic meals he is having difficulty maintaining his sugars.  At this time I have recommended decreasing his long-acting insulin  dose, keeping a food and insulin  journal to detail what he is eating, when he is taking his insulin  and the results of his glucose readings.  Patient has a  good sliding scale where patient feels in good control of his sugars with the short acting insulin .  At this time I recommend controlling hyperglycemia more with short acting insulin  until we can figure out an appropriate long-acting insulin  dose.  Patient is agreeable with this plan.  He will keep a documentation of food intake and resulting glucose levels.  Follow-up primary care..  Patient is given ED precautions to return to the ED for any worsening or new symptoms.     FINAL CLINICAL IMPRESSION(S) / ED DIAGNOSES   Final diagnoses:  Hypoglycemia     Rx / DC Orders   ED Discharge Orders     None        Note:  This document was prepared using Dragon voice recognition software and may include unintentional dictation errors.   Ana Dorn JONETTA DEVONNA 11/15/23 1931    Lang Dover, MD 11/15/23 [phone]

## 2023-11-17 ENCOUNTER — Telehealth: Payer: Self-pay

## 2023-11-17 NOTE — Transitions of Care (Post Inpatient/ED Visit) (Signed)
 11/17/2023  Name: Russell Gomez MRN: [id number] DOB: 1961/10/28  Today's TOC FU Call Status: Today's TOC FU Call Status:: Successful TOC FU Call Completed TOC FU Call Complete Date: 11/17/23 Patient's Name and Date of Birth confirmed.  Transition Care Management Follow-up Telephone Call Date of Discharge: 11/16/23 Discharge Facility: [facility]) Type of Discharge: Emergency Department Reason for ED Visit: Other: (hypoglycemia) How have you been since you were released from the hospital?: Better Any questions or concerns?: No  Items Reviewed: Did you receive and understand the discharge instructions provided?: Yes Medications obtained,verified, and reconciled?: Yes (Medications Reviewed) Any new allergies since your discharge?: No Dietary orders reviewed?: Yes Do you have support at home?: Yes People in Home: spouse  Medications Reviewed Today: Medications Reviewed Today     Reviewed by Emmitt Pan, LPN (Licensed Practical Nurse) on 11/17/23 at 1415  Med List Status: <None>   Medication Order Taking? Sig Documenting Provider Last Dose Status Informant  apixaban  (ELIQUIS ) 5 MG TABS tablet [id number] No TAKE 1 TABLET (5MG  TOTAL) BY MOUTH 2 TIMES A DAY. Gollan, Timothy J, MD Taking Active   cyanocobalamin  (,VITAMIN B-12,) 1000 MCG/ML injection [id number] No Inject 1 mL (1,000 mcg total) into the muscle every 30 (thirty) days.  Patient not taking: Reported on 06/30/2023   Rilla Baller, MD Not Taking Active   HYDROcodone -acetaminophen  (NORCO/VICODIN) 5-325 MG tablet [id number] No Take 1 tablet by mouth every 4 (four) hours as needed for moderate pain.  Patient not taking: Reported on 10/17/2023   Rodolph Romano, MD Not Taking Active   insulin  NPH Human (NOVOLIN N) 100 UNIT/ML injection [id number] No Inject 0.12 mLs (12 Units total) into the skin 2 (two) times daily before a meal. Rilla Baller, MD Taking Active   insulin  regular (NOVOLIN R) 100 units/mL  injection [id number] No Inject 2-15 Units into the skin 3 (three) times daily before meals. [provider] Taking Active Self           Med Note CARLON, NORMAN JONETTA   Fri Aug 13, 2022  6:32 PM) Gaetano be over 200  magnesium  oxide (MAG-OX) 400 (240 Mg) MG tablet [id number] No Take 1 tablet (400 mg total) by mouth daily. Rilla Baller, MD Taking Active   metoprolol  tartrate (LOPRESSOR ) 50 MG tablet [id number]  Take 1 tablet (50 mg total) by mouth 2 (two) times daily. Gollan, Timothy J, MD  Active   pantoprazole  (PROTONIX ) 40 MG tablet [id number] No TAKE 1 TABLET AT BEDTIME Rilla Baller, MD Taking Active   pravastatin  (PRAVACHOL ) 80 MG tablet [id number] No TAKE 1 TABLET(80 MG) BY MOUTH DAILY Rilla Baller, MD Taking Active   torsemide  (DEMADEX ) 20 MG tablet [id number] No TAKE 1 TABLET DAILY Rilla Baller, MD Taking Active             Home Care and Equipment/Supplies: Were Home Health Services Ordered?: NA Any new equipment or medical supplies ordered?: NA  Functional Questionnaire: Do you need assistance with bathing/showering or dressing?: No Do you need assistance with meal preparation?: No Do you need assistance with eating?: No Do you have difficulty maintaining continence: No Do you need assistance with getting out of bed/getting out of a chair/moving?: No Do you have difficulty managing or taking your medications?: No  Follow up appointments reviewed: PCP Follow-up appointment confirmed?: NA Specialist Hospital Follow-up appointment confirmed?: No Reason Specialist Follow-Up Not Confirmed: Patient has Specialist Provider Number and will Call for Appointment Do you need transportation to your follow-up appointment?: No Do you  understand care options if your condition(s) worsen?: Yes-patient verbalized understanding    SIGNATURE Julian Lemmings, LPN [facility] Nurse Health Advisor Direct Dial  [phone]

## 2023-11-22 ENCOUNTER — Other Ambulatory Visit: Payer: Self-pay | Admitting: Family Medicine

## 2023-11-22 DIAGNOSIS — E1069 Type 1 diabetes mellitus with other specified complication: Secondary | ICD-10-CM

## 2023-11-23 DIAGNOSIS — I152 Hypertension secondary to endocrine disorders: Secondary | ICD-10-CM | POA: Diagnosis not present

## 2023-11-23 DIAGNOSIS — E1069 Type 1 diabetes mellitus with other specified complication: Secondary | ICD-10-CM | POA: Diagnosis not present

## 2023-11-23 DIAGNOSIS — E782 Mixed hyperlipidemia: Secondary | ICD-10-CM | POA: Diagnosis not present

## 2023-11-23 DIAGNOSIS — E10649 Type 1 diabetes mellitus with hypoglycemia without coma: Secondary | ICD-10-CM | POA: Diagnosis not present

## 2023-11-25 ENCOUNTER — Other Ambulatory Visit: Payer: Self-pay | Admitting: Cardiovascular Disease

## 2023-12-05 ENCOUNTER — Other Ambulatory Visit (INDEPENDENT_AMBULATORY_CARE_PROVIDER_SITE_OTHER): Payer: BC Managed Care – PPO

## 2023-12-05 DIAGNOSIS — E559 Vitamin D deficiency, unspecified: Secondary | ICD-10-CM | POA: Diagnosis not present

## 2023-12-05 DIAGNOSIS — E538 Deficiency of other specified B group vitamins: Secondary | ICD-10-CM | POA: Diagnosis not present

## 2023-12-05 DIAGNOSIS — E1069 Type 1 diabetes mellitus with other specified complication: Secondary | ICD-10-CM

## 2023-12-05 DIAGNOSIS — E213 Hyperparathyroidism, unspecified: Secondary | ICD-10-CM | POA: Diagnosis not present

## 2023-12-05 DIAGNOSIS — E785 Hyperlipidemia, unspecified: Secondary | ICD-10-CM | POA: Diagnosis not present

## 2023-12-05 DIAGNOSIS — E108 Type 1 diabetes mellitus with unspecified complications: Secondary | ICD-10-CM

## 2023-12-05 DIAGNOSIS — E519 Thiamine deficiency, unspecified: Secondary | ICD-10-CM | POA: Diagnosis not present

## 2023-12-05 DIAGNOSIS — R946 Abnormal results of thyroid function studies: Secondary | ICD-10-CM

## 2023-12-05 DIAGNOSIS — D539 Nutritional anemia, unspecified: Secondary | ICD-10-CM

## 2023-12-05 LAB — LIPID PANEL
Cholesterol: 152 mg/dL (ref 0–200)
HDL: 45.9 mg/dL (ref >39.00)
LDL Cholesterol: 84 mg/dL (ref 0–99)
NonHDL: 106.13
Total CHOL/HDL Ratio: 3
Triglycerides: 109 mg/dL (ref 0.0–149.0)
VLDL: 21.8 mg/dL (ref 0.0–40.0)

## 2023-12-05 LAB — COMPREHENSIVE METABOLIC PANEL
ALT: 9 U/L (ref 0–53)
AST: 12 U/L (ref 0–37)
Albumin: 3.7 g/dL (ref 3.5–5.2)
Alkaline Phosphatase: 72 U/L (ref 39–117)
BUN: 19 mg/dL (ref 6–23)
CO2: 28 meq/L (ref 19–32)
Calcium: 8.6 mg/dL (ref 8.4–10.5)
Chloride: 104 meq/L (ref 96–112)
Creatinine, Ser: 0.92 mg/dL (ref 0.40–1.50)
GFR: 89.99 mL/min (ref >60.00)
Glucose, Bld: 214 mg/dL — ABNORMAL HIGH (ref 70–99)
Potassium: 4.3 meq/L (ref 3.5–5.1)
Sodium: 139 meq/L (ref 135–145)
Total Bilirubin: 0.3 mg/dL (ref 0.2–1.2)
Total Protein: 5.8 g/dL — ABNORMAL LOW (ref 6.0–8.3)

## 2023-12-05 LAB — CBC WITH DIFFERENTIAL/PLATELET
Basophils Absolute: 0.1 10*3/uL (ref 0.0–0.1)
Basophils Relative: 0.8 % (ref 0.0–3.0)
Eosinophils Absolute: 0.4 10*3/uL (ref 0.0–0.7)
Eosinophils Relative: 5.3 % — ABNORMAL HIGH (ref 0.0–5.0)
HCT: 42.2 % (ref 39.0–52.0)
Hemoglobin: 14 g/dL (ref 13.0–17.0)
Lymphocytes Relative: 29.7 % (ref 12.0–46.0)
Lymphs Abs: 2.3 10*3/uL (ref 0.7–4.0)
MCHC: 33.1 g/dL (ref 30.0–36.0)
MCV: 93.4 fL (ref 78.0–100.0)
Monocytes Absolute: 0.5 10*3/uL (ref 0.1–1.0)
Monocytes Relative: 6.8 % (ref 3.0–12.0)
Neutro Abs: 4.5 10*3/uL (ref 1.4–7.7)
Neutrophils Relative %: 57.4 % (ref 43.0–77.0)
Platelets: 331 10*3/uL (ref 150.0–400.0)
RBC: 4.53 Mil/uL (ref 4.22–5.81)
RDW: 13.9 % (ref 11.5–15.5)
WBC: 7.8 10*3/uL (ref 4.0–10.5)

## 2023-12-05 LAB — MICROALBUMIN / CREATININE URINE RATIO
Creatinine,U: 117.5 mg/dL
Microalb Creat Ratio: 0.6 mg/g (ref 0.0–30.0)
Microalb, Ur: 0.8 mg/dL (ref 0.0–1.9)

## 2023-12-05 LAB — PHOSPHORUS: Phosphorus: 3.5 mg/dL (ref 2.3–4.6)

## 2023-12-05 LAB — VITAMIN B12: Vitamin B-12: 185 pg/mL — ABNORMAL LOW (ref 211–911)

## 2023-12-05 LAB — FERRITIN: Ferritin: 32 ng/mL (ref 22.0–322.0)

## 2023-12-05 LAB — T4, FREE: Free T4: 1.02 ng/dL (ref 0.60–1.60)

## 2023-12-05 LAB — IBC PANEL
Iron: 40 ug/dL — ABNORMAL LOW (ref 42–165)
Saturation Ratios: 15.7 % — ABNORMAL LOW (ref 20.0–50.0)
TIBC: 254.8 ug/dL (ref 250.0–450.0)
Transferrin: 182 mg/dL — ABNORMAL LOW (ref 212.0–360.0)

## 2023-12-05 LAB — VITAMIN D 25 HYDROXY (VIT D DEFICIENCY, FRACTURES): VITD: 23.83 ng/mL — ABNORMAL LOW (ref 30.00–100.00)

## 2023-12-05 LAB — MAGNESIUM: Magnesium: 1.9 mg/dL (ref 1.5–2.5)

## 2023-12-05 LAB — HEMOGLOBIN A1C: Hgb A1c MFr Bld: 6.9 % — ABNORMAL HIGH (ref 4.6–6.5)

## 2023-12-05 LAB — TSH: TSH: 0.55 u[IU]/mL (ref 0.35–5.50)

## 2023-12-05 LAB — FOLATE: Folate: 17 ng/mL (ref >5.9)

## 2023-12-06 LAB — T3: T3, Total: 93 ng/dL (ref 76–181)

## 2023-12-06 LAB — CALCIUM, IONIZED: Calcium, Ion: 5 mg/dL (ref 4.7–5.5)

## 2023-12-06 LAB — PARATHYROID HORMONE, INTACT (NO CA): PTH: 33 pg/mL (ref 16–77)

## 2023-12-10 LAB — VITAMIN B1: Vitamin B1 (Thiamine): 13 nmol/L (ref 8–30)

## 2023-12-12 ENCOUNTER — Ambulatory Visit (INDEPENDENT_AMBULATORY_CARE_PROVIDER_SITE_OTHER): Payer: BC Managed Care – PPO | Admitting: Family Medicine

## 2023-12-12 ENCOUNTER — Encounter: Payer: Self-pay | Admitting: Family Medicine

## 2023-12-12 VITALS — BP 134/66 | HR 70 | Temp 97.9°F | Ht 70.25 in | Wt 217.4 lb

## 2023-12-12 DIAGNOSIS — E538 Deficiency of other specified B group vitamins: Secondary | ICD-10-CM

## 2023-12-12 DIAGNOSIS — E213 Hyperparathyroidism, unspecified: Secondary | ICD-10-CM

## 2023-12-12 DIAGNOSIS — E1069 Type 1 diabetes mellitus with other specified complication: Secondary | ICD-10-CM

## 2023-12-12 DIAGNOSIS — I480 Paroxysmal atrial fibrillation: Secondary | ICD-10-CM

## 2023-12-12 DIAGNOSIS — F17200 Nicotine dependence, unspecified, uncomplicated: Secondary | ICD-10-CM

## 2023-12-12 DIAGNOSIS — I714 Abdominal aortic aneurysm, without rupture, unspecified: Secondary | ICD-10-CM

## 2023-12-12 DIAGNOSIS — I10 Essential (primary) hypertension: Secondary | ICD-10-CM

## 2023-12-12 DIAGNOSIS — Z0001 Encounter for general adult medical examination with abnormal findings: Secondary | ICD-10-CM

## 2023-12-12 DIAGNOSIS — Z7189 Other specified counseling: Secondary | ICD-10-CM | POA: Diagnosis not present

## 2023-12-12 DIAGNOSIS — E559 Vitamin D deficiency, unspecified: Secondary | ICD-10-CM

## 2023-12-12 DIAGNOSIS — E785 Hyperlipidemia, unspecified: Secondary | ICD-10-CM

## 2023-12-12 DIAGNOSIS — Z23 Encounter for immunization: Secondary | ICD-10-CM | POA: Diagnosis not present

## 2023-12-12 DIAGNOSIS — R946 Abnormal results of thyroid function studies: Secondary | ICD-10-CM

## 2023-12-12 DIAGNOSIS — R600 Localized edema: Secondary | ICD-10-CM

## 2023-12-12 DIAGNOSIS — K219 Gastro-esophageal reflux disease without esophagitis: Secondary | ICD-10-CM

## 2023-12-12 DIAGNOSIS — E108 Type 1 diabetes mellitus with unspecified complications: Secondary | ICD-10-CM

## 2023-12-12 MED ORDER — VITAMIN B-12 1000 MCG PO TABS: 1000.0000 ug | ORAL_TABLET | Freq: Every day | ORAL | Status: AC

## 2023-12-12 MED ORDER — VITAMIN D3 25 MCG (1000 UT) PO CAPS: 1.0000 | ORAL_CAPSULE | Freq: Every day | ORAL | Status: AC

## 2023-12-12 MED ORDER — TORSEMIDE 20 MG PO TABS: 20.0000 mg | ORAL_TABLET | Freq: Every day | ORAL | 4 refills | Status: DC

## 2023-12-12 MED ORDER — PRAVASTATIN SODIUM 80 MG PO TABS: 80.0000 mg | ORAL_TABLET | Freq: Every day | ORAL | 4 refills | Status: DC

## 2023-12-12 MED ORDER — PANTOPRAZOLE SODIUM 40 MG PO TBEC: 40.0000 mg | DELAYED_RELEASE_TABLET | Freq: Every day | ORAL | 4 refills | Status: DC

## 2023-12-12 MED ORDER — CYANOCOBALAMIN 1000 MCG/ML IJ SOLN
1000.0000 ug | Freq: Once | INTRAMUSCULAR | Status: AC
  Administered 2023-12-12: 1000 ug via INTRAMUSCULAR

## 2023-12-12 NOTE — Assessment & Plan Note (Signed)
Stable period on daily torsemide.

## 2023-12-12 NOTE — Assessment & Plan Note (Signed)
Stable period on pravastatin - continue. The 10-year ASCVD risk score (Arnett DK, et al., 2019) is: 18.2%   Values used to calculate the score:     Age: 63 years     Sex: Male     Is Non-Hispanic African American: No     Diabetic: Yes     Tobacco smoker: No     Systolic Blood Pressure: 134 mmHg     Is BP treated: Yes     HDL Cholesterol: 45.9 mg/dL     Total Cholesterol: 152 mg/dL

## 2023-12-12 NOTE — Assessment & Plan Note (Signed)
Update AAA [place] in [place].

## 2023-12-12 NOTE — Patient Instructions (Addendum)
2nd and final shingrix shot today  B12 shot today then start b12 daily over the counter. Start vitamin D3 1000 unitss daily over the counter Schedule follow up with [facility], ask them to send [place] report when complete.  2nd and final shingles shot today  Good to see you today  Return in 6 months for follow up visit

## 2023-12-12 NOTE — Assessment & Plan Note (Signed)
Chronic with improved sugar control based on A1c however notes ongoing sugar fluctuations.  He sees endo, currently on toujeo with plan to switch regular to Fiasp insulin for carb counting mealtime + SSI correction.

## 2023-12-12 NOTE — Assessment & Plan Note (Signed)
Advanced directive - unsure if has this set up. [place] Bentson would be HCPOA. Full code but wouldn't want prolonged life support if terminal condition.

## 2023-12-12 NOTE — Assessment & Plan Note (Signed)
Chronic, followed by cardiology on eliquis and metoprolol.

## 2023-12-12 NOTE — Assessment & Plan Note (Signed)
 Chronic, stable. Continue current regimen.

## 2023-12-12 NOTE — Progress Notes (Signed)
Ph: [phone] Fax: [phone]   Patient ID: Palma Holter, male    DOB: 13-Mar-1961, 63 y.o.   MRN: [id number]  This visit was conducted in person.  BP 134/66   Pulse 70   Temp 97.9 F (36.6 C) (Oral)   Ht 5' 10.25" (1.784 m)   Wt 217 lb 6 oz (98.6 kg)   SpO2 97%   BMI 30.97 kg/m    CC: cpe Subjective:   HPI: JUNIOR HUEZO is a 63 y.o. male presenting on 12/12/2023 for Annual Exam   T1DM followed by [facility] Dr Gershon Crane - curently on toujeo 4/3u twice daily, with short acting Fiasp insulin 1:15 carb ratio + SSI 1u for 50>200 Currently on DexCom G7, planning to transition to [place] 3 plus CGM.  ER visit 11/15/2023 for hypoglycemia.   Continues pravastatin 80mg  for HLD.  TSH intermittently suppressed - following with endo  Afib diagnosed 01/2021 on eliquis sees cardiology.  S/p incisional hernia repair 06/2023.   Continues working 2nd shift at [employer] in [place]. Gets up at 10am, leaves for work at [employer].   Preventative: COLONOSCOPY WITH PROPOFOL 11/19/2021 through colostomy - multiple TAs, rpt ? Tonna Boehringer, Isami, DO)  Prostate cancer screening - yearly PSA  Lung cancer screening - interested. 40 PY hx. will refer Flu - declines  COVID vaccine - declines Pneumovax - 02/2015  Td 2010, Tdap 11/2017 Shingrix - 07/2022, 2nd today Advanced directive - unsure if has this set up. Jaysten Essner would be HCPOA. Full code but wouldn't want prolonged life support if terminal condition.  Seat belt use discussed Sunscreen use discussed, no changing moles on skin.  Smoker - 1 ppd for ~40 years, currently some day smoking (a couple a week), vaping.  Alcohol - extensive use previously, fully quit 2022! 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 Interim medical history since our last visit reviewed. Allergies and medications reviewed and updated. Outpatient Medications Prior to Visit  Medication Sig Dispense Refill   apixaban (ELIQUIS) 5 MG TABS tablet TAKE 1 TABLET (5MG  TOTAL) BY MOUTH 2 TIMES A DAY. 60 tablet 0   insulin aspart (FIASP FLEXTOUCH) 100 UNIT/ML FlexTouch Pen Take 3 times a day prior to meal (1:15 carb ratio) + sliding scale 1 unit for every 50 over 200     insulin glargine, 1 Unit Dial, (TOUJEO SOLOSTAR) 300 UNIT/ML Solostar Pen Inject 5 Units into the skin daily AND 4 Units at bedtime.     magnesium oxide (MAG-OX) 400 (240 Mg) MG tablet Take 1 tablet (400 mg total) by mouth daily. 90 tablet 0   metoprolol tartrate (LOPRESSOR) 50 MG tablet TAKE 1 TABLET TWICE A DAY (DUE FOR FOLLOW UP VISIT, PLEASE CALL OFFICE TO SCHEDULE APPOINTMENT PRIOR TO NEXT REFILL) 180 tablet 3   insulin NPH Human (NOVOLIN N) 100 UNIT/ML injection Inject 0.12 mLs (12 Units total) into the skin 2 (two) times daily before a meal.     insulin regular (NOVOLIN R) 100 units/mL injection Inject 2-15 Units into the skin 3 (three) times daily before meals.     pantoprazole (PROTONIX) 40 MG tablet TAKE  1 TABLET AT BEDTIME 90 tablet 0   pravastatin (PRAVACHOL) 80 MG tablet TAKE 1 TABLET DAILY (NEED ANNUAL PHYSICAL APPOINTMENT FOR ADDITIONAL REFILLS) 90 tablet 0   torsemide (DEMADEX) 20 MG tablet TAKE 1 TABLET DAILY 90 tablet 0   cyanocobalamin (,VITAMIN B-12,) 1000 MCG/ML injection Inject 1 mL (1,000 mcg total) into the muscle every 30 (thirty) days. (Patient not taking: Reported on 06/30/2023)     HYDROcodone-acetaminophen (NORCO/VICODIN) 5-325 MG tablet Take 1 tablet by mouth every 4 (four) hours as needed for moderate pain. (Patient not taking: Reported on 10/17/2023) 20 tablet 0   No facility-administered  medications prior to visit.     Per HPI unless specifically indicated in ROS section below Review of Systems  Constitutional:  Positive for appetite change. Negative for activity change, chills, fatigue, fever and unexpected weight change.  HENT:  Negative for hearing loss.   Eyes:  Negative for visual disturbance.  Respiratory:  Negative for cough, chest tightness, shortness of breath and wheezing.   Cardiovascular:  Positive for leg swelling. Negative for chest pain and palpitations.  Gastrointestinal:  Negative for abdominal distention, abdominal pain, blood in stool, constipation, diarrhea, nausea and vomiting.  Genitourinary:  Negative for difficulty urinating and hematuria.  Musculoskeletal:  Negative for arthralgias, myalgias and neck pain.  Skin:  Negative for rash.  Neurological:  Negative for dizziness, seizures, syncope and headaches.  Hematological:  Negative for adenopathy. Bruises/bleeds easily.  Psychiatric/Behavioral:  Negative for dysphoric mood. The patient is not nervous/anxious.     Objective:  BP 134/66   Pulse 70   Temp 97.9 F (36.6 C) (Oral)   Ht 5' 10.25" (1.784 m)   Wt 217 lb 6 oz (98.6 kg)   SpO2 97%   BMI 30.97 kg/m   Wt Readings from Last 3 Encounters:  12/12/23 217 lb 6 oz (98.6 kg)  11/15/23 222 lb (100.7 kg)  10/17/23 222 lb 6 oz (100.9 kg)      Physical Exam Vitals and nursing note reviewed.  Constitutional:      General: He is not in acute distress.    Appearance: Normal appearance. He is well-developed. He is not ill-appearing.  HENT:     Head: Normocephalic and atraumatic.     Right Ear: Hearing, tympanic membrane, ear canal and external ear normal.     Left Ear: Hearing, tympanic membrane, ear canal and external ear normal.     Mouth/Throat:     Mouth: Mucous membranes are moist.     Pharynx: Oropharynx is clear. No oropharyngeal exudate or posterior oropharyngeal erythema.  Eyes:     General: No scleral icterus.    Extraocular  Movements: Extraocular movements intact.     Conjunctiva/sclera: Conjunctivae normal.     Pupils: Pupils are equal, round, and reactive to light.  Neck:     Thyroid: No thyroid mass or thyromegaly.     Vascular: No carotid bruit.  Cardiovascular:     Rate and Rhythm: Normal rate and regular rhythm.     Pulses: Normal pulses.          Radial pulses are 2+ on the right side and 2+ on the left side.     Heart sounds: Normal heart sounds. No murmur heard. Pulmonary:     Effort: Pulmonary effort is normal. No respiratory distress.     Breath sounds: Normal breath sounds. No wheezing, rhonchi or rales.  Abdominal:     General: Bowel sounds are normal. There is no distension.  Palpations: Abdomen is soft. There is no mass.     Tenderness: There is no abdominal tenderness. There is no guarding or rebound.     Hernia: No hernia is present.  Musculoskeletal:        General: Normal range of motion.     Cervical back: Normal range of motion and neck supple.     Right lower leg: No edema.     Left lower leg: No edema.  Lymphadenopathy:     Cervical: No cervical adenopathy.  Skin:    General: Skin is warm and dry.     Findings: No rash.  Neurological:     General: No focal deficit present.     Mental Status: He is alert and oriented to person, place, and time.  Psychiatric:        Mood and Affect: Mood normal.        Behavior: Behavior normal.        Thought Content: Thought content normal.        Judgment: Judgment normal.       Results for orders placed or performed in visit on 12/05/23  T3   Collection Time: 12/05/23  9:52 AM  Result Value Ref Range   T3, Total 93 76 - 181 ng/dL  IBC panel   Collection Time: 12/05/23  9:52 AM  Result Value Ref Range   Iron 40 (L) 42 - 165 ug/dL   Transferrin 295.6 (L) 212.0 - 360.0 mg/dL   Saturation Ratios 21.3 (L) 20.0 - 50.0 %   TIBC 254.8 250.0 - 450.0 mcg/dL  Ferritin   Collection Time: 12/05/23  9:52 AM  Result Value Ref Range    Ferritin 32.0 22.0 - 322.0 ng/mL  VITAMIN D 25 Hydroxy (Vit-D Deficiency, Fractures)   Collection Time: 12/05/23  9:52 AM  Result Value Ref Range   VITD 23.83 (L) 30.00 - 100.00 ng/mL  Vitamin B1   Collection Time: 12/05/23  9:52 AM  Result Value Ref Range   Vitamin B1 (Thiamine) 13 8 - 30 nmol/L  Folate   Collection Time: 12/05/23  9:52 AM  Result Value Ref Range   Folate 17.0 >5.9 ng/mL  Vitamin B12   Collection Time: 12/05/23  9:52 AM  Result Value Ref Range   Vitamin B-12 185 (L) 211 - 911 pg/mL  Phosphorus   Collection Time: 12/05/23  9:52 AM  Result Value Ref Range   Phosphorus 3.5 2.3 - 4.6 mg/dL  Parathyroid hormone, intact (no Ca)   Collection Time: 12/05/23  9:52 AM  Result Value Ref Range   PTH 33 16 - 77 pg/mL  CBC with Differential/Platelet   Collection Time: 12/05/23  9:52 AM  Result Value Ref Range   WBC 7.8 4.0 - 10.5 K/uL   RBC 4.53 4.22 - 5.81 Mil/uL   Hemoglobin 14.0 13.0 - 17.0 g/dL   HCT 08.6 57.8 - 46.9 %   MCV 93.4 78.0 - 100.0 fl   MCHC 33.1 30.0 - 36.0 g/dL   RDW 62.9 52.8 - 41.3 %   Platelets 331.0 150.0 - 400.0 K/uL   Neutrophils Relative % 57.4 43.0 - 77.0 %   Lymphocytes Relative 29.7 12.0 - 46.0 %   Monocytes Relative 6.8 3.0 - 12.0 %   Eosinophils Relative 5.3 (H) 0.0 - 5.0 %   Basophils Relative 0.8 0.0 - 3.0 %   Neutro Abs 4.5 1.4 - 7.7 K/uL   Lymphs Abs 2.3 0.7 - 4.0 K/uL   Monocytes Absolute 0.5 0.1 -  1.0 K/uL   Eosinophils Absolute 0.4 0.0 - 0.7 K/uL   Basophils Absolute 0.1 0.0 - 0.1 K/uL  T4, free   Collection Time: 12/05/23  9:52 AM  Result Value Ref Range   Free T4 1.02 0.60 - 1.60 ng/dL  TSH   Collection Time: 12/05/23  9:52 AM  Result Value Ref Range   TSH 0.55 0.35 - 5.50 uIU/mL  Comprehensive metabolic panel   Collection Time: 12/05/23  9:52 AM  Result Value Ref Range   Sodium 139 135 - 145 mEq/L   Potassium 4.3 3.5 - 5.1 mEq/L   Chloride 104 96 - 112 mEq/L   CO2 28 19 - 32 mEq/L   Glucose, Bld 214 (H) 70 - 99  mg/dL   BUN 19 6 - 23 mg/dL   Creatinine, Ser 2.13 0.40 - 1.50 mg/dL   Total Bilirubin 0.3 0.2 - 1.2 mg/dL   Alkaline Phosphatase 72 39 - 117 U/L   AST 12 0 - 37 U/L   ALT 9 0 - 53 U/L   Total Protein 5.8 (L) 6.0 - 8.3 g/dL   Albumin 3.7 3.5 - 5.2 g/dL   GFR 08.65 >78.46 mL/min   Calcium 8.6 8.4 - 10.5 mg/dL  Lipid panel   Collection Time: 12/05/23  9:52 AM  Result Value Ref Range   Cholesterol 152 0 - 200 mg/dL   Triglycerides 962.9 0.0 - 149.0 mg/dL   HDL 52.84 >13.24 mg/dL   VLDL 40.1 0.0 - 02.7 mg/dL   LDL Cholesterol 84 0 - 99 mg/dL   Total CHOL/HDL Ratio 3    NonHDL 106.13   Microalbumin / creatinine urine ratio   Collection Time: 12/05/23  9:52 AM  Result Value Ref Range   Microalb, Ur 0.8 0.0 - 1.9 mg/dL   Creatinine,U 253.6 mg/dL   Microalb Creat Ratio 0.6 0.0 - 30.0 mg/g  Hemoglobin A1c   Collection Time: 12/05/23  9:52 AM  Result Value Ref Range   Hgb A1c MFr Bld 6.9 (H) 4.6 - 6.5 %  Calcium, ionized   Collection Time: 12/05/23  9:52 AM  Result Value Ref Range   Calcium, Ion 5.0 4.7 - 5.5 mg/dL  Magnesium   Collection Time: 12/05/23  9:52 AM  Result Value Ref Range   Magnesium 1.9 1.5 - 2.5 mg/dL    Assessment & Plan:   Problem List Items Addressed This Visit     Encounter for general adult medical examination with abnormal findings - Primary (Chronic)   Preventative protocols reviewed and updated unless pt declined. Discussed healthy diet and lifestyle.       Advanced directives, counseling/discussion (Chronic)   Advanced directive - unsure if has this set up. Xzaiver Vayda would be HCPOA. Full code but wouldn't want prolonged life support if terminal condition.       Type 1 diabetes mellitus with complications (HCC)   Chronic with improved sugar control based on A1c however notes ongoing sugar fluctuations.  He sees endo, currently on toujeo with plan to switch regular to Fiasp insulin for carb counting mealtime + SSI correction.       Relevant  Medications   pravastatin (PRAVACHOL) 80 MG tablet   insulin glargine, 1 Unit Dial, (TOUJEO SOLOSTAR) 300 UNIT/ML Solostar Pen   insulin aspart (FIASP FLEXTOUCH) 100 UNIT/ML FlexTouch Pen   Hyperlipidemia due to type 1 diabetes mellitus (HCC)   Stable period on pravastatin - continue. The 10-year ASCVD risk score (Arnett DK, et al., 2019) is: 18.2%  Values used to calculate the score:     Age: 77 years     Sex: Male     Is Non-Hispanic African American: No     Diabetic: Yes     Tobacco smoker: No     Systolic Blood Pressure: 134 mmHg     Is BP treated: Yes     HDL Cholesterol: 45.9 mg/dL     Total Cholesterol: 152 mg/dL       Relevant Medications   pravastatin (PRAVACHOL) 80 MG tablet   torsemide (DEMADEX) 20 MG tablet   insulin glargine, 1 Unit Dial, (TOUJEO SOLOSTAR) 300 UNIT/ML Solostar Pen   insulin aspart (FIASP FLEXTOUCH) 100 UNIT/ML FlexTouch Pen   TOBACCO ABUSE   Continue to encourage smoking cessation. Down to a few cigarettes per week.  He declines lung cancer screening program at this time.       Essential hypertension   Chronic, stable. Continue current regimen.       Relevant Medications   pravastatin (PRAVACHOL) 80 MG tablet   torsemide (DEMADEX) 20 MG tablet   GERD   Pantoprazole 40mg  refilled.       Relevant Medications   pantoprazole (PROTONIX) 40 MG tablet   Abnormal thyroid function test   H/o suppressed TSH followed by endo. Latest TFTs normal.       Pedal edema   Stable period on daily torsemide.      Relevant Medications   torsemide (DEMADEX) 20 MG tablet   Vitamin B12 deficiency   B12 shot today then start OTC oral B12 replacement daily       Vitamin D deficiency   Rec start 1000 international units daily.      Hyperparathyroidism (HCC)   PTH remains normal.       AAA (abdominal aortic aneurysm) (HCC)   Update AAA [place] in [place].       Relevant Medications   pravastatin (PRAVACHOL) 80 MG tablet   torsemide (DEMADEX) 20 MG  tablet   Other Relevant Orders   US AORTA   Paroxysmal atrial fibrillation (HCC)   Chronic, followed by cardiology on eliquis and metoprolol.       Relevant Medications   pravastatin (PRAVACHOL) 80 MG tablet   torsemide (DEMADEX) 20 MG tablet   Other Visit Diagnoses       Encounter for immunization       Relevant Orders   Varicella-zoster vaccine IM (Completed)        Meds ordered this encounter  Medications   pantoprazole (PROTONIX) 40 MG tablet    Sig: Take 1 tablet (40 mg total) by mouth daily. 30-60 min before a meal    Dispense:  90 tablet    Refill:  4   pravastatin (PRAVACHOL) 80 MG tablet    Sig: Take 1 tablet (80 mg total) by mouth at bedtime.    Dispense:  90 tablet    Refill:  4   torsemide (DEMADEX) 20 MG tablet    Sig: Take 1 tablet (20 mg total) by mouth daily.    Dispense:  90 tablet    Refill:  4   cyanocobalamin (VITAMIN B12) 1000 MCG tablet    Sig: Take 1 tablet (1,000 mcg total) by mouth daily.   Cholecalciferol (VITAMIN D3) 25 MCG (1000 UT) CAPS    Sig: Take 1 capsule (1,000 Units total) by mouth daily.   cyanocobalamin (VITAMIN B12) injection 1,000 mcg    Orders Placed This Encounter  Procedures   US AORTA  Standing Status:   Future    Expected Date:   12/12/2023    Expiration Date:   06/10/2024    Scheduling Instructions:     Pt requests appt to be scheduled between 10am-12pm    Reason for Exam (SYMPTOM  OR DIAGNOSIS REQUIRED):   follow up 3.1cm AAA noted on abd [place] 2021    Preferred imaging location?:   [facility]   Varicella-zoster vaccine IM    Patient Instructions  2nd and final shingrix shot today  B12 shot today then start b12 daily over the counter. Start vitamin D3 1000 unitss daily over the counter Schedule follow up with [facility], ask them to send [place] report when complete.  2nd and final shingles shot today  Good to see you today  Return in 6 months for follow up visit   Follow up plan: Return in about  6 months (around 06/10/2024) for follow up visit.  [name], MD

## 2023-12-12 NOTE — Assessment & Plan Note (Addendum)
H/o suppressed TSH followed by endo. Latest TFTs normal.

## 2023-12-12 NOTE — Assessment & Plan Note (Signed)
Rec start 1000 international units daily.

## 2023-12-12 NOTE — Assessment & Plan Note (Signed)
 Preventative protocols reviewed and updated unless pt declined. Discussed healthy diet and lifestyle.

## 2023-12-12 NOTE — Assessment & Plan Note (Signed)
B12 shot today then start OTC oral B12 replacement daily

## 2023-12-12 NOTE — Assessment & Plan Note (Signed)
PTH remains normal.

## 2023-12-12 NOTE — Assessment & Plan Note (Signed)
Pantoprazole 40 mg refilled. 

## 2023-12-12 NOTE — Assessment & Plan Note (Addendum)
Continue to encourage smoking cessation. Down to a few cigarettes per week.  He declines lung cancer screening program at this time.

## 2023-12-13 ENCOUNTER — Telehealth: Payer: Self-pay | Admitting: Family Medicine

## 2023-12-13 DIAGNOSIS — E108 Type 1 diabetes mellitus with unspecified complications: Secondary | ICD-10-CM

## 2023-12-13 NOTE — Telephone Encounter (Signed)
 Copied from CRM [id number]. Topic: Referral - Request for Referral >> Dec 13, 2023 11:54 AM Corin V wrote: Did the patient discuss referral with their provider in the last year? Yes (If No - schedule appointment) (If Yes - send message)  Appointment offered? No  Type of order/referral and detailed reason for visit: Endocrinology- upset with [facility] clinic and wants to switch to see Dr. Trixie  Preference of office, provider, location: Dr. Lela Trixie with [facility]  If referral order, have you been seen by this specialty before? Yes- currently, [facility] (If Yes, this issue or another issue? When? Where?  Can we respond through MyChart? Yes

## 2023-12-14 NOTE — Telephone Encounter (Signed)
Lvm asking pt to call back.  Need to relay Dr. G's message.  

## 2023-12-14 NOTE — Addendum Note (Signed)
 Addended by: Claire Crick on: 12/14/2023 02:29 PM   Modules accepted: Orders

## 2023-12-14 NOTE — Telephone Encounter (Addendum)
 Plz notify pt new referral placed to [facility] end[facility] Dr Aldona Amel.

## 2023-12-15 NOTE — Telephone Encounter (Signed)
 Lvm asking pt to call back. Need to notify pt Dr Crissie Dome placed a new referral placed to [facility] with Dr Aldona Amel.

## 2023-12-15 NOTE — Telephone Encounter (Signed)
 Copied from CRM [id number]. Topic: Referral - Status >> Dec 15, 2023 12:08 PM Pinkey ORN wrote: Reason for CRM: Endocrinology Referral >> Dec 15, 2023 12:09 PM Pinkey ORN wrote: Patient called in wanting an update on his referral status. Requesting a call back with a detailed messaged as far as who the referral is for and who he will be seeing.

## 2023-12-16 NOTE — Telephone Encounter (Signed)
 Lvm asking pt to call back. Need to notify pt Dr Crissie Dome placed a new referral placed to [facility] with Dr Aldona Amel.

## 2023-12-19 NOTE — Telephone Encounter (Signed)
 Lvm asking pt to call back. Need to notify pt Dr Crissie Dome placed a new referral placed to [facility] with Dr Aldona Amel.

## 2023-12-20 NOTE — Telephone Encounter (Signed)
Lvm asking pt to call back. Need to notify pt Dr Reece Agar placed a new referral placed to [facility] with Dr Elvera Lennox.  Mailing a letter.

## 2024-01-24 ENCOUNTER — Other Ambulatory Visit: Payer: Self-pay | Admitting: Cardiovascular Disease

## 2024-01-24 NOTE — Telephone Encounter (Addendum)
 Prescription refill request for Eliquis received. Indication: AF Last office visit: 10/17/23  Concha Se MD Scr: 0.92 on 12/05/23  Epic Age: 62 Weight: 100.9kg  Based on above findings Eliquis 5mg  twice daily is the appropriate dose.  Refill approved.

## 2024-02-20 ENCOUNTER — Other Ambulatory Visit: Payer: BC Managed Care – PPO

## 2024-02-22 ENCOUNTER — Ambulatory Visit (INDEPENDENT_AMBULATORY_CARE_PROVIDER_SITE_OTHER): Payer: BC Managed Care – PPO | Admitting: "Endocrinology

## 2024-02-22 ENCOUNTER — Encounter: Payer: Self-pay | Admitting: "Endocrinology

## 2024-02-22 VITALS — BP 122/80 | HR 61 | Ht 70.25 in | Wt 227.0 lb

## 2024-02-22 DIAGNOSIS — E10649 Type 1 diabetes mellitus with hypoglycemia without coma: Secondary | ICD-10-CM | POA: Diagnosis not present

## 2024-02-22 LAB — POCT GLYCOSYLATED HEMOGLOBIN (HGB A1C): Hemoglobin A1C: 5.6 % (ref 4.0–5.6)

## 2024-02-22 MED ORDER — FREESTYLE LIBRE 3 PLUS SENSOR MISC: 3 refills | Status: DC

## 2024-02-22 MED ORDER — INSULIN LISPRO (1 UNIT DIAL) 100 UNIT/ML (KWIKPEN): 2.0000 [IU] | PEN_INJECTOR | Freq: Three times a day (TID) | SUBCUTANEOUS | 3 refills | Status: DC

## 2024-02-22 MED ORDER — BAQSIMI ONE PACK 3 MG/DOSE NA POWD: 1.0000 | NASAL | 3 refills | Status: AC | PRN

## 2024-02-22 MED ORDER — INSULIN GLARGINE-YFGN 100 UNIT/ML ~~LOC~~ SOPN
10.0000 [IU] | PEN_INJECTOR | Freq: Two times a day (BID) | SUBCUTANEOUS | 3 refills | Status: DC
Start: 2024-02-22 — End: 2024-02-28

## 2024-02-22 NOTE — Patient Instructions (Addendum)
  Will recommend the following: Semglee 10 units twice a day Humalog take 1 units for 25-50mg /dl 15 min before meals Write log BG log as well as units of insulin taken to adjust humalog dose  Ordered libre 3 + Ordered DM education  ________   Goals of DM therapy:  Morning Fasting blood sugar: 80-140  Blood sugar before meals: 80-140 Bed time blood sugar: 100-150  A1C <7%, limited only by hypoglycemia  1.Diabetes medications and their side effects discussed, including hypoglycemia    2. Check blood glucose:  a) Always check blood sugars before driving. Please see below (under hypoglycemia) on how to manage b) Check a minimum of 3 times/day or more as needed when having symptoms of hypoglycemia.   c) Try to check blood glucose before sleeping/in the middle of the night to ensure that it is remaining stable and not dropping less than 100 d) Check blood glucose more often if sick  3. Diet: a) 3 meals per day schedule b: Restrict carbs to 60-70 grams (4 servings) per meal c) Colorful vegetables - 3 servings a day, and low sugar fruit 2 servings/day Plate control method: 1/4 plate protein, 1/4 starch, 1/2 green, yellow, or red vegetables d) Avoid carbohydrate snacks unless hypoglycemic episode, or increased physical activity  4. Regular exercise as tolerated, preferably 3 or more hours a week  5. Hypoglycemia: a)  Do not drive or operate machinery without first testing blood glucose to assure it is over 90 mg%, or if dizzy, lightheaded, not feeling normal, etc, or  if foot or leg is numb or weak. b)  If blood glucose less than 70, take four 5gm Glucose tabs or 15-30 gm Glucose gel.  Repeat every 15 min as needed until blood sugar is >100 mg/dl. If hypoglycemia persists then call 911.   6. Sick day management: a) Check blood glucose more often b) Continue usual therapy if blood sugars are elevated.   7. Contact the doctor immediately if blood glucose is frequently <60 mg/dl, or  an episode of severe hypoglycemia occurs (where someone had to give you glucose/  glucagon or if you passed out from a low blood glucose), or if blood glucose is persistently >350 mg/dl, for further management  8. A change in level of physical activity or exercise and a change in diet may also affect your blood sugar. Check blood sugars more often and call if needed.  Instructions: 1. Bring glucose meter, blood glucose records on every visit for review 2. Continue to follow up with primary care physician and other providers for medical care 3. Yearly eye  and foot exam 4. Please get blood work done prior to the next appointment
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
## 2024-02-28 ENCOUNTER — Telehealth: Payer: Self-pay

## 2024-02-28 DIAGNOSIS — E10649 Type 1 diabetes mellitus with hypoglycemia without coma: Secondary | ICD-10-CM

## 2024-02-28 MED ORDER — INSULIN GLARGINE-YFGN 100 UNIT/ML ~~LOC~~ SOPN
10.0000 [IU] | PEN_INJECTOR | Freq: Two times a day (BID) | SUBCUTANEOUS | 0 refills | Status: AC
Start: 2024-02-28 — End: 2024-03-09

## 2024-02-28 NOTE — Telephone Encounter (Signed)
 Requested Prescriptions   Signed Prescriptions Disp Refills   insulin  glargine-yfgn (SEMGLEE ) 100 UNIT/ML Pen 2 mL 0    Sig: Inject 10 Units into the skin 2 (two) times daily for 10 days.    Authorizing Provider: Jorge Newcomer    Ordering User: Raquel Cables, Raahil Ong N   Sent 10 days supply of insulin  to the local pharmacy due to delay with [facility].

## 2024-03-26 ENCOUNTER — Telehealth: Payer: Self-pay | Admitting: "Endocrinology

## 2024-03-26 ENCOUNTER — Ambulatory Visit: Admitting: "Endocrinology

## 2024-03-26 NOTE — Telephone Encounter (Signed)
 Patient is calling to say that he has lost his job and his insurance.  He has no money to pay for his medical supplies/prescriptions.  He would like to know what he should do.

## 2024-03-27 NOTE — Telephone Encounter (Signed)
 Lvm for pt to call back regarding pt assistance.

## 2024-03-27 NOTE — Telephone Encounter (Signed)
 Pt called back regarding pt assistance, I informed pt how to go about applying for [facility].

## 2024-03-30 ENCOUNTER — Other Ambulatory Visit: Payer: Self-pay

## 2024-04-04 ENCOUNTER — Telehealth: Payer: Self-pay

## 2024-04-04 NOTE — Telephone Encounter (Signed)
 Received mailed disability form from Kennie Peach of [employer].   Placed form in Dr Ocie Belt box.

## 2024-04-06 NOTE — Telephone Encounter (Signed)
 This was for social security disability determination.  I can fill out some information as his primary care provider but I don't specifically do social security disability evaluations.  Recommend he go to [email or web address] for application (don't know if he's already started this process). He will need consultative examination (CE) through whoever SS recommends.

## 2024-04-09 ENCOUNTER — Other Ambulatory Visit: Payer: Self-pay

## 2024-04-09 ENCOUNTER — Telehealth: Payer: Self-pay

## 2024-04-09 DIAGNOSIS — R600 Localized edema: Secondary | ICD-10-CM

## 2024-04-09 DIAGNOSIS — K219 Gastro-esophageal reflux disease without esophagitis: Secondary | ICD-10-CM

## 2024-04-09 DIAGNOSIS — E1069 Type 1 diabetes mellitus with other specified complication: Secondary | ICD-10-CM

## 2024-04-09 MED ORDER — MAGNESIUM OXIDE -MG SUPPLEMENT 400 (240 MG) MG PO TABS
400.0000 mg | ORAL_TABLET | Freq: Every day | ORAL | 3 refills | Status: AC
  Filled 2024-04-09 – 2024-08-13 (×2): qty 90, 90d supply, fill #0
  Filled 2024-11-18: qty 90, 90d supply, fill #1

## 2024-04-09 MED ORDER — PANTOPRAZOLE SODIUM 40 MG PO TBEC
40.0000 mg | DELAYED_RELEASE_TABLET | Freq: Every day | ORAL | 0 refills | Status: DC
  Filled 2024-04-09: qty 90, 90d supply, fill #0

## 2024-04-09 MED ORDER — PRAVASTATIN SODIUM 80 MG PO TABS
80.0000 mg | ORAL_TABLET | Freq: Every day | ORAL | 2 refills | Status: AC
  Filled 2024-04-09: qty 30, 30d supply, fill #0
  Filled 2024-05-22 – 2024-06-01 (×2): qty 30, 30d supply, fill #1
  Filled 2024-07-19: qty 30, 30d supply, fill #2
  Filled 2024-09-03: qty 30, 30d supply, fill #3
  Filled 2024-10-31: qty 30, 30d supply, fill #4

## 2024-04-09 MED ORDER — TORSEMIDE 20 MG PO TABS
20.0000 mg | ORAL_TABLET | Freq: Every day | ORAL | 2 refills | Status: AC
  Filled 2024-04-09: qty 90, 90d supply, fill #0
  Filled 2024-10-09: qty 90, 90d supply, fill #1

## 2024-04-09 NOTE — Telephone Encounter (Signed)
 Copied from CRM [id number]. Topic: Clinical - Prescription Issue >> Apr 09, 2024  2:53 PM Chuck Crater wrote: Reason for CRM: Patient wants all of his prescriptions that we handle sent in to [facility] on [address]. Magnesium  oxide (MAG-OX) 400 (240 Mg) MG tablet Torsemide  20 MG pravastatin  (PRAVACHOL ) 80 MG tablet pantoprazole  (PROTONIX ) 40 MG tablet

## 2024-04-09 NOTE — Telephone Encounter (Signed)
 Noted. Glad he found a job

## 2024-04-09 NOTE — Addendum Note (Signed)
 Addended by: Claire Crick on: 04/09/2024 05:15 PM   Modules accepted: Orders

## 2024-04-09 NOTE — Telephone Encounter (Signed)
Lvm asking pt to call back.  Need to relay Dr. G's message.  

## 2024-04-09 NOTE — Telephone Encounter (Signed)
 Copied from CRM [id number]. Topic: General - Other >> Apr 09, 2024  2:52 PM Chuck Crater wrote: Reason for CRM: Patient wanted to let Dr, G know that he can disregard the social Security determination form. He said that he got hired the other day.

## 2024-04-09 NOTE — Telephone Encounter (Signed)
 Fyi to Dr. Reece Agar

## 2024-04-09 NOTE — Telephone Encounter (Signed)
 Updated pt's pharmacy list. E-scribed torsemide , pravastatin  and pantoprazole  rxs to [facility] at [facility].  Magnesium  oxide Last rx:  06/30/23, #90 Last OV:  12/12/23, CPE Next OV:  none

## 2024-04-10 ENCOUNTER — Other Ambulatory Visit: Payer: Self-pay

## 2024-04-10 DIAGNOSIS — E10649 Type 1 diabetes mellitus with hypoglycemia without coma: Secondary | ICD-10-CM

## 2024-04-10 MED ORDER — BASAGLAR KWIKPEN 100 UNIT/ML ~~LOC~~ SOPN
PEN_INJECTOR | SUBCUTANEOUS | 1 refills | Status: DC
  Filled 2024-04-10: qty 12, 30d supply, fill #0
  Filled 2024-05-04: qty 15, 30d supply, fill #0

## 2024-04-16 ENCOUNTER — Telehealth: Payer: Self-pay | Admitting: Cardiovascular Disease

## 2024-04-16 NOTE — Telephone Encounter (Signed)
 Pt c/o medication issue:  1. Name of Medication: apixaban  (ELIQUIS ) 5 MG TABS tablet  metoprolol  tartrate (LOPRESSOR ) 50 MG tablet  2. How are you currently taking this medication (dosage and times per day)? As directed  3. Are you having a reaction (difficulty breathing--STAT)? No  4. What is your medication issue? Pt requesting cb to discuss if pt assistance is avail due to pt losing job and insurance

## 2024-04-17 ENCOUNTER — Telehealth: Payer: Self-pay | Admitting: Pharmacy Technician

## 2024-04-17 ENCOUNTER — Other Ambulatory Visit (HOSPITAL_COMMUNITY): Payer: Self-pay

## 2024-04-17 ENCOUNTER — Other Ambulatory Visit: Payer: Self-pay

## 2024-04-17 MED ORDER — METOPROLOL TARTRATE 50 MG PO TABS
50.0000 mg | ORAL_TABLET | Freq: Two times a day (BID) | ORAL | 3 refills | Status: AC
  Filled 2024-04-17: qty 180, 90d supply, fill #0
  Filled 2024-08-20: qty 180, 90d supply, fill #1
  Filled 2024-11-18: qty 180, 90d supply, fill #2

## 2024-04-17 MED ORDER — APIXABAN 5 MG PO TABS
5.0000 mg | ORAL_TABLET | Freq: Two times a day (BID) | ORAL | 1 refills | Status: DC
  Filled 2024-04-17: qty 180, 90d supply, fill #0
  Filled 2024-08-20: qty 180, 90d supply, fill #1
  Filled 2024-08-21: qty 60, 30d supply, fill #1
  Filled 2024-08-21: qty 180, 90d supply, fill #1

## 2024-04-17 NOTE — Telephone Encounter (Signed)
 RX sent to pharmacy as requested.   Thank you!   Will route to RN as Arlie Lain.

## 2024-04-17 NOTE — Telephone Encounter (Signed)
   Both ready. Patient called back. I notified him of both of these being ready.

## 2024-04-17 NOTE — Telephone Encounter (Signed)
     Sent back to staff:

## 2024-04-20 ENCOUNTER — Other Ambulatory Visit: Payer: Self-pay

## 2024-04-23 ENCOUNTER — Telehealth: Payer: Self-pay
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
## 2024-04-25 ENCOUNTER — Encounter: Payer: Self-pay | Admitting: Family Medicine

## 2024-04-25 ENCOUNTER — Ambulatory Visit (INDEPENDENT_AMBULATORY_CARE_PROVIDER_SITE_OTHER): Payer: Self-pay | Admitting: Family Medicine

## 2024-04-25 ENCOUNTER — Telehealth: Payer: Self-pay | Admitting: Family Medicine

## 2024-04-25 VITALS — BP 138/82 | HR 68 | Temp 98.0°F | Ht 70.25 in | Wt 233.1 lb

## 2024-04-25 DIAGNOSIS — I714 Abdominal aortic aneurysm, without rupture, unspecified: Secondary | ICD-10-CM

## 2024-04-25 DIAGNOSIS — Z0279 Encounter for issue of other medical certificate: Secondary | ICD-10-CM

## 2024-04-25 DIAGNOSIS — K631 Perforation of intestine (nontraumatic): Secondary | ICD-10-CM

## 2024-04-25 DIAGNOSIS — E785 Hyperlipidemia, unspecified: Secondary | ICD-10-CM

## 2024-04-25 DIAGNOSIS — I480 Paroxysmal atrial fibrillation: Secondary | ICD-10-CM

## 2024-04-25 DIAGNOSIS — E108 Type 1 diabetes mellitus with unspecified complications: Secondary | ICD-10-CM

## 2024-04-25 DIAGNOSIS — E1069 Type 1 diabetes mellitus with other specified complication: Secondary | ICD-10-CM

## 2024-04-25 DIAGNOSIS — I10 Essential (primary) hypertension: Secondary | ICD-10-CM

## 2024-04-25 DIAGNOSIS — Z9049 Acquired absence of other specified parts of digestive tract: Secondary | ICD-10-CM | POA: Diagnosis not present

## 2024-04-25 DIAGNOSIS — Z8719 Personal history of other diseases of the digestive system: Secondary | ICD-10-CM

## 2024-04-25 NOTE — Patient Instructions (Addendum)
 You may apply for social security disability evaluation - may go to [email or web address] to apply online.  I will fill out disability forms and send in - you may need to complete a consultative examination (CE).

## 2024-04-25 NOTE — Telephone Encounter (Signed)
 Patient arrived to check in, was asked for insurance card.  Said office could call the pharmacy he didn't have it.  Was unable to state who insurance was with.  Was asked if he would like to be self pay patient. Mr Deharo asked what the cost was, was giving the estimate on 63. At that point he left the office. Admin followed out to parking lot was not given chance to explain that patient could be billed for services did not need to pay at time of service.  Attempted to call patient, after locating insurance to advise insurance has been update.  When patient arrived again to check in was told he had 72 dollar copay stated he did not have money, advised copay could be billed.

## 2024-04-25 NOTE — Progress Notes (Unsigned)
 Ph: [phone] Fax: [phone]   Patient ID: Arnett Bi, male    DOB: Nov 12, 1960, 63 y.o.   MRN: [id number]  This visit was conducted in person.  BP 138/82   Pulse 68   Temp 98 F (36.7 C) (Oral)   Ht 5' 10.25 (1.784 m)   Wt 233 lb 2 oz (105.7 kg)   SpO2 97%   BMI 33.21 kg/m    CC: discuss disability  Subjective:   HPI: KELSEY EDMAN is a 63 y.o. male presenting on 04/25/2024 for Medical Management of Chronic Issues (Wants to discuss disability. Recently let go from job due to health conditions. )   Interested in evaluation for disability. Was recently released from new job due to health conditions.  He wants to work but has had trouble finding a job over the past month. 30 interviews, none have panned out.  Has used temp agency - started job in [place] - but then toled that due to health conditions he was let go. Told needed a controlled climate environment.   Known T1DM recently established with [facility] Dr Vertell Gory.  Afib on eliquis .   Quit [employer] in [place] - was being overworked 18 days straight at a time.  Was there for over a year.      Relevant past medical, surgical, family and social history reviewed and updated as indicated. Interim medical history since our last visit reviewed. Allergies and medications reviewed and updated. Outpatient Medications Prior to Visit  Medication Sig Dispense Refill   apixaban  (ELIQUIS ) 5 MG TABS tablet Take 1 tablet (5 mg total) by mouth 2 (two) times daily. 180 tablet 1   Insulin  Glargine (BASAGLAR  KWIKPEN) 100 UNIT/ML Inject 2-10 Units into the skin 3 (three) times daily. 15 min before meals, max dose 40 units/day 15 mL 1   magnesium  oxide (MAG-OX) 400 (240 Mg) MG tablet Take 1 tablet (400 mg total) by mouth daily. 90 tablet 3   metoprolol  tartrate (LOPRESSOR ) 50 MG tablet Take 1 tablet (50 mg total) by mouth 2 (two) times daily. 180 tablet 3   pantoprazole  (PROTONIX ) 40 MG tablet Take 1 tablet (40  mg total) by mouth daily. 30-60 min before a meal 90 tablet 0   pravastatin  (PRAVACHOL ) 80 MG tablet Take 1 tablet (80 mg total) by mouth at bedtime. 90 tablet 2   torsemide  (DEMADEX ) 20 MG tablet Take 1 tablet (20 mg total) by mouth daily. 90 tablet 2   Cholecalciferol  (VITAMIN D3) 25 MCG (1000 UT) CAPS Take 1 capsule (1,000 Units total) by mouth daily.     Continuous Glucose Sensor (FREESTYLE LIBRE 3 PLUS SENSOR) MISC Change sensor every 15 days. 6 each 3   cyanocobalamin  (VITAMIN B12) 1000 MCG tablet Take 1 tablet (1,000 mcg total) by mouth daily.     Glucagon  (BAQSIMI  ONE PACK) 3 MG/DOSE POWD Place 1 Device into the nose as needed (Low blood sugar with impaired consciousness). 2 each 3   No facility-administered medications prior to visit.     Per HPI unless specifically indicated in ROS section below Review of Systems  Objective:  BP 138/82   Pulse 68   Temp 98 F (36.7 C) (Oral)   Ht 5' 10.25 (1.784 m)   Wt 233 lb 2 oz (105.7 kg)   SpO2 97%   BMI 33.21 kg/m   Wt Readings from Last 3 Encounters:  04/25/24 233 lb 2 oz (105.7 kg)  02/22/24 227 lb (103 kg)  12/12/23 217 lb  6 oz (98.6 kg)      Physical Exam Vitals and nursing note reviewed.  Constitutional:      Appearance: Normal appearance. He is not ill-appearing.   Neurological:     Mental Status: He is alert.   Psychiatric:        Mood and Affect: Mood normal.        Behavior: Behavior normal.        Thought Content: Thought content normal.        Judgment: Judgment normal.       Results for orders placed or performed in visit on 02/22/24  POCT glycosylated hemoglobin (Hb A1C)   Collection Time: 02/22/24  8:59 AM  Result Value Ref Range   Hemoglobin A1C 5.6 4.0 - 5.6 %   HbA1c POC (<> result, manual entry)     HbA1c, POC (prediabetic range)     HbA1c, POC (controlled diabetic range)      Assessment & Plan:   Problem List Items Addressed This Visit   None    No orders of the defined types were  placed in this encounter.   No orders of the defined types were placed in this encounter.   There are no Patient Instructions on file for this visit.  Follow up plan: No follow-ups on file.  Claire Crick, MD

## 2024-04-28 ENCOUNTER — Encounter: Payer: Self-pay | Admitting: Family Medicine

## 2024-04-28 NOTE — Assessment & Plan Note (Signed)
 He is working on disability process through [employer], aware he may need to complete consultative examination (CE). I will fill out as much disability paperwork as I can, but discussed I don't do SS CE's that may be required for disability evaluation.

## 2024-04-28 NOTE — Assessment & Plan Note (Signed)
 Chronic, sees endo. Continues eliquis  and metoprolol 

## 2024-04-28 NOTE — Assessment & Plan Note (Signed)
 Chronic T1DM, stable period followed by endocrinology Dr Dartha on daily insulin .  H/o hypoglycemia.

## 2024-04-28 NOTE — Assessment & Plan Note (Signed)
 H/o this - 3.1cm by abd US  05/2020. Updated aorta US  ordered 12/2023, pending.

## 2024-05-03 ENCOUNTER — Other Ambulatory Visit: Payer: Self-pay

## 2024-05-03 ENCOUNTER — Other Ambulatory Visit (HOSPITAL_COMMUNITY): Payer: Self-pay

## 2024-05-03 ENCOUNTER — Other Ambulatory Visit: Payer: Self-pay | Admitting: "Endocrinology

## 2024-05-03 DIAGNOSIS — E10649 Type 1 diabetes mellitus with hypoglycemia without coma: Secondary | ICD-10-CM

## 2024-05-04 ENCOUNTER — Telehealth: Payer: Self-pay

## 2024-05-04 ENCOUNTER — Other Ambulatory Visit: Payer: Self-pay

## 2024-05-04 MED ORDER — INSULIN LISPRO (1 UNIT DIAL) 100 UNIT/ML (KWIKPEN)
2.0000 [IU] | PEN_INJECTOR | Freq: Three times a day (TID) | SUBCUTANEOUS | 3 refills | Status: AC
  Filled 2024-05-04: qty 15, 38d supply, fill #0
  Filled 2024-06-15: qty 15, 38d supply, fill #1
  Filled 2024-07-19: qty 3, 10d supply, fill #2
  Filled 2024-07-24: qty 9, 30d supply, fill #3
  Filled 2024-07-24: qty 15, 37d supply, fill #3
  Filled 2024-07-30 (×2): qty 3, 10d supply, fill #3
  Filled 2024-07-30: qty 9, 30d supply, fill #3
  Filled 2024-08-27: qty 9, 30d supply, fill #4
  Filled 2024-09-26: qty 9, 30d supply, fill #5

## 2024-05-04 NOTE — Telephone Encounter (Signed)
 [facility] called office regarding clarification on pt medication Basaglar . Pt humalog  has the same instruction. Please clarify medication instruction

## 2024-05-07 ENCOUNTER — Other Ambulatory Visit: Payer: Self-pay | Admitting: "Endocrinology

## 2024-05-07 ENCOUNTER — Other Ambulatory Visit: Payer: Self-pay

## 2024-05-07 DIAGNOSIS — E10649 Type 1 diabetes mellitus with hypoglycemia without coma: Secondary | ICD-10-CM

## 2024-05-07 MED ORDER — BASAGLAR KWIKPEN 100 UNIT/ML ~~LOC~~ SOPN
10.0000 [IU] | PEN_INJECTOR | Freq: Two times a day (BID) | SUBCUTANEOUS | 0 refills | Status: DC
  Filled 2024-05-07: qty 6, 30d supply, fill #0

## 2024-05-07 NOTE — Telephone Encounter (Signed)
 LMx1 for patient to schedule appointment this week or next week.  (Needs follow up, can see him this or next week, plenty spots available )

## 2024-05-08 NOTE — Telephone Encounter (Signed)
 LMx2 to schedule follow up. Needs follow up, can see him this or next week, plenty spots available )

## 2024-05-10 NOTE — Telephone Encounter (Signed)
 LMx3 to schedule follow up this week or next week.

## 2024-05-21 ENCOUNTER — Telehealth: Payer: Self-pay

## 2024-05-21 ENCOUNTER — Other Ambulatory Visit: Payer: Self-pay

## 2024-05-21 NOTE — Telephone Encounter (Signed)
 Lvm for pt to call back regarding reschedule appt.

## 2024-05-22 ENCOUNTER — Other Ambulatory Visit: Payer: Self-pay

## 2024-05-22 ENCOUNTER — Telehealth: Payer: Self-pay | Admitting: "Endocrinology

## 2024-05-22 DIAGNOSIS — E10649 Type 1 diabetes mellitus with hypoglycemia without coma: Secondary | ICD-10-CM

## 2024-05-22 MED ORDER — BASAGLAR KWIKPEN 100 UNIT/ML ~~LOC~~ SOPN
10.0000 [IU] | PEN_INJECTOR | Freq: Two times a day (BID) | SUBCUTANEOUS | 0 refills | Status: DC
  Filled 2024-05-22 – 2024-06-15 (×2): qty 18, 90d supply, fill #0

## 2024-05-22 NOTE — Telephone Encounter (Addendum)
 MEDICATION: Semglee   PHARMACY:   [facility] (Ph: [phone])  Order Details  HAS THE PATIENT CONTACTED THEIR PHARMACY?  Yes  IS THIS A 90 DAY SUPPLY : Yes IS PATIENT OUT OF MEDICATION:   IF NOT; HOW MUCH IS LEFT: No  LAST APPOINTMENT DATE: @4 /16/2025  NEXT APPOINTMENT DATE:@Visit  date not found  DO WE HAVE YOUR PERMISSION TO LEAVE A DETAILED MESSAGE?:Yes  OTHER COMMENTS: Patient sing 12 units (went up himself) instead of 10 units. This is keeping his bloodsugar in the 200's unless he eats something he should not.  Patient scheduled follow up, but refused to come in before 06/25/2024.   **Let patient know to contact pharmacy at the end of the day to make sure medication is ready. **  ** Please notify patient to allow 48-72 hours to process**  **Encourage patient to contact the pharmacy for refills or they can request refills through [facility]**

## 2024-05-22 NOTE — Telephone Encounter (Signed)
 Requested Prescriptions   Signed Prescriptions Disp Refills   Insulin  Glargine (BASAGLAR  KWIKPEN) 100 UNIT/ML 18 mL 0    Sig: Inject 10 Units into the skin 2 (two) times daily.    Authorizing Provider: DARTHA ERNST    Ordering User: ARLOA JEOFFREY SAILOR

## 2024-05-31 ENCOUNTER — Other Ambulatory Visit: Payer: Self-pay

## 2024-06-01 ENCOUNTER — Other Ambulatory Visit: Payer: Self-pay

## 2024-06-15 ENCOUNTER — Other Ambulatory Visit: Payer: Self-pay

## 2024-06-27 ENCOUNTER — Ambulatory Visit: Admitting: "Endocrinology

## 2024-07-12 ENCOUNTER — Ambulatory Visit (INDEPENDENT_AMBULATORY_CARE_PROVIDER_SITE_OTHER): Payer: Self-pay | Admitting: "Endocrinology

## 2024-07-12 ENCOUNTER — Encounter: Payer: Self-pay | Admitting: "Endocrinology

## 2024-07-12 ENCOUNTER — Other Ambulatory Visit: Payer: Self-pay

## 2024-07-12 VITALS — BP 136/80 | HR 64 | Ht 70.0 in | Wt 236.0 lb

## 2024-07-12 DIAGNOSIS — Z794 Long term (current) use of insulin: Secondary | ICD-10-CM

## 2024-07-12 DIAGNOSIS — E7800 Pure hypercholesterolemia, unspecified: Secondary | ICD-10-CM

## 2024-07-12 DIAGNOSIS — E10649 Type 1 diabetes mellitus with hypoglycemia without coma: Secondary | ICD-10-CM

## 2024-07-12 LAB — POCT GLYCOSYLATED HEMOGLOBIN (HGB A1C): Hemoglobin A1C: 6.8 % — AB (ref 4.0–5.6)

## 2024-07-12 MED ORDER — FREESTYLE LIBRE 3 PLUS SENSOR MISC
3 refills | Status: DC
  Filled 2024-07-12: qty 6, fill #0

## 2024-07-12 MED ORDER — FREESTYLE LIBRE 3 PLUS SENSOR MISC
3 refills | Status: AC
  Filled 2024-07-12: qty 6, fill #0

## 2024-07-12 NOTE — Patient Instructions (Signed)
 Recommend: Basaglar  10 units twice a day Humalog  10 units fifteen min before each meal Humalog  Correction scale: Use in addition to your meal time/short acting insulin  based on blood sugars as follows:   151 - 175: 1 unit 176 - 200: 2 units 201 - 225: 3 units 226 - 250: 4 units 251 - 275: 5 units 276 - 300: 6 units 301 - 325: 7 units 326 - 350: 8 units 351 - 375: 9 units 376 - 400: 10 units
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
## 2024-07-20 ENCOUNTER — Other Ambulatory Visit: Payer: Self-pay

## 2024-07-23 ENCOUNTER — Other Ambulatory Visit: Payer: Self-pay

## 2024-07-24 ENCOUNTER — Other Ambulatory Visit (HOSPITAL_COMMUNITY): Payer: Self-pay

## 2024-07-24 ENCOUNTER — Other Ambulatory Visit: Payer: Self-pay

## 2024-07-25 ENCOUNTER — Other Ambulatory Visit: Payer: Self-pay | Admitting: "Endocrinology

## 2024-07-25 ENCOUNTER — Other Ambulatory Visit: Payer: Self-pay

## 2024-07-25 DIAGNOSIS — E10649 Type 1 diabetes mellitus with hypoglycemia without coma: Secondary | ICD-10-CM

## 2024-07-25 MED ORDER — BASAGLAR KWIKPEN 100 UNIT/ML ~~LOC~~ SOPN
10.0000 [IU] | PEN_INJECTOR | Freq: Two times a day (BID) | SUBCUTANEOUS | 1 refills | Status: AC
  Filled 2024-07-25 – 2024-09-17 (×4): qty 18, 90d supply, fill #0
  Filled 2024-12-12: qty 18, 90d supply, fill #1

## 2024-07-30 ENCOUNTER — Other Ambulatory Visit: Payer: Self-pay

## 2024-07-30 ENCOUNTER — Other Ambulatory Visit (HOSPITAL_COMMUNITY): Payer: Self-pay

## 2024-08-06 ENCOUNTER — Other Ambulatory Visit: Payer: Self-pay | Admitting: Family Medicine

## 2024-08-08 ENCOUNTER — Other Ambulatory Visit: Payer: Self-pay

## 2024-08-13 ENCOUNTER — Other Ambulatory Visit: Payer: Self-pay

## 2024-08-14 ENCOUNTER — Other Ambulatory Visit (HOSPITAL_COMMUNITY): Payer: Self-pay

## 2024-08-14 ENCOUNTER — Other Ambulatory Visit: Payer: Self-pay

## 2024-08-14 LAB — COMPREHENSIVE METABOLIC PANEL WITH GFR
AG Ratio: 1.7 (calc) (ref 1.0–2.5)
ALT: 8 U/L — ABNORMAL LOW (ref 9–46)
AST: 8 U/L — ABNORMAL LOW (ref 10–35)
Albumin: 4.2 g/dL (ref 3.6–5.1)
Alkaline phosphatase (APISO): 79 U/L (ref 35–144)
BUN: 19 mg/dL (ref 7–25)
CO2: 27 mmol/L (ref 20–32)
Calcium: 8.9 mg/dL (ref 8.6–10.3)
Chloride: 103 mmol/L (ref 98–110)
Creat: 0.91 mg/dL (ref 0.70–1.35)
Globulin: 2.5 g/dL (ref 1.9–3.7)
Glucose, Bld: 331 mg/dL — ABNORMAL HIGH (ref 65–99)
Potassium: 4.3 mmol/L (ref 3.5–5.3)
Sodium: 138 mmol/L (ref 135–146)
Total Bilirubin: 0.5 mg/dL (ref 0.2–1.2)
Total Protein: 6.7 g/dL (ref 6.1–8.1)
eGFR: 96 mL/min/1.73m2 (ref >=60)

## 2024-08-14 LAB — LIPID PANEL
Cholesterol: 166 mg/dL (ref <200)
HDL: 56 mg/dL (ref >=40)
LDL Cholesterol (Calc): 92 mg/dL
Non-HDL Cholesterol (Calc): 110 mg/dL (ref <130)
Total CHOL/HDL Ratio: 3 (calc) (ref <5.0)
Triglycerides: 85 mg/dL (ref <150)

## 2024-08-14 LAB — MICROALBUMIN / CREATININE URINE RATIO
Creatinine, Urine: 13 mg/dL — ABNORMAL LOW (ref 20–320)
Microalb Creat Ratio: 23 mg/g{creat} (ref <30)
Microalb, Ur: 0.3 mg/dL

## 2024-08-20 ENCOUNTER — Other Ambulatory Visit: Payer: Self-pay

## 2024-08-21 ENCOUNTER — Other Ambulatory Visit: Payer: Self-pay

## 2024-08-22 ENCOUNTER — Other Ambulatory Visit: Payer: Self-pay

## 2024-08-27 ENCOUNTER — Other Ambulatory Visit: Payer: Self-pay

## 2024-08-28 ENCOUNTER — Other Ambulatory Visit: Payer: Self-pay

## 2024-08-29 ENCOUNTER — Other Ambulatory Visit: Payer: Self-pay | Admitting: Family Medicine

## 2024-08-29 ENCOUNTER — Other Ambulatory Visit: Payer: Self-pay

## 2024-08-29 DIAGNOSIS — K219 Gastro-esophageal reflux disease without esophagitis: Secondary | ICD-10-CM

## 2024-08-30 ENCOUNTER — Other Ambulatory Visit: Payer: Self-pay

## 2024-08-30 MED ORDER — PANTOPRAZOLE SODIUM 40 MG PO TBEC
40.0000 mg | DELAYED_RELEASE_TABLET | Freq: Every day | ORAL | 1 refills | Status: AC
  Filled 2024-08-30: qty 90, 90d supply, fill #0
  Filled 2024-11-18: qty 90, 90d supply, fill #1

## 2024-09-06 ENCOUNTER — Other Ambulatory Visit: Payer: Self-pay

## 2024-09-17 ENCOUNTER — Other Ambulatory Visit (HOSPITAL_COMMUNITY): Payer: Self-pay

## 2024-09-17 ENCOUNTER — Other Ambulatory Visit: Payer: Self-pay

## 2024-09-18 ENCOUNTER — Other Ambulatory Visit: Payer: Self-pay

## 2024-09-18 ENCOUNTER — Ambulatory Visit (INDEPENDENT_AMBULATORY_CARE_PROVIDER_SITE_OTHER): Payer: Self-pay | Admitting: "Endocrinology

## 2024-09-27 ENCOUNTER — Telehealth: Payer: Self-pay

## 2024-09-27 NOTE — Telephone Encounter (Signed)
 Pt stopped by the office today to let Dr Dartha know that he's having to take up to 30 units of his (HUMALOG ) 100 UNIT/ML KwikPen on the weekends due to sugar's being elevated around 300-400 and he is running out of insulin . Please advise.

## 2024-09-28 ENCOUNTER — Other Ambulatory Visit

## 2024-10-02 NOTE — Telephone Encounter (Signed)
 Patient states that he cannot do appointment before 11/08/2024.  Patient is scheduled for 11/13/2024 at 9:00 AM with Dr. Dartha.

## 2024-10-08 ENCOUNTER — Telehealth: Payer: Self-pay | Admitting: Cardiovascular Disease

## 2024-10-08 NOTE — Telephone Encounter (Signed)
 Fax rec'vd from [employer] requesting review for change from eliquis  to Pradaxa for cost savings. Fax scanned to media

## 2024-10-09 ENCOUNTER — Telehealth: Payer: Self-pay | Admitting: "Endocrinology

## 2024-10-09 ENCOUNTER — Other Ambulatory Visit: Payer: Self-pay

## 2024-10-09 NOTE — Telephone Encounter (Signed)
 Patient's wife dropped off blood sugar logs

## 2024-10-09 NOTE — Telephone Encounter (Signed)
 I put pt blood sugar readings in Dr office to review.

## 2024-10-09 NOTE — Telephone Encounter (Signed)
 Called patient and notified him of the following from Dr. Gollan.  Needs appt and labs before refilling blood thinner, Pradaxa ok We can provide refills until he is able to be seen Thx TGollan  Patient verbalizes understanding. Patient scheduled to be seen in clinic 10/15/24.

## 2024-10-11 ENCOUNTER — Encounter: Payer: Self-pay | Admitting: General Surgery
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
## 2024-10-15 ENCOUNTER — Other Ambulatory Visit: Payer: Self-pay

## 2024-10-15 ENCOUNTER — Encounter: Payer: Self-pay | Admitting: Cardiovascular Disease

## 2024-10-15 ENCOUNTER — Ambulatory Visit: Attending: Cardiovascular Disease | Admitting: Cardiovascular Disease

## 2024-10-15 ENCOUNTER — Telehealth: Admitting: "Endocrinology

## 2024-10-15 VITALS — BP 160/80 | HR 56 | Ht 74.0 in | Wt 233.5 lb

## 2024-10-15 DIAGNOSIS — I480 Paroxysmal atrial fibrillation: Secondary | ICD-10-CM | POA: Diagnosis not present

## 2024-10-15 DIAGNOSIS — I700 Atherosclerosis of aorta: Secondary | ICD-10-CM | POA: Diagnosis not present

## 2024-10-15 DIAGNOSIS — I10 Essential (primary) hypertension: Secondary | ICD-10-CM | POA: Diagnosis not present

## 2024-10-15 DIAGNOSIS — E785 Hyperlipidemia, unspecified: Secondary | ICD-10-CM | POA: Diagnosis not present

## 2024-10-15 DIAGNOSIS — Z87898 Personal history of other specified conditions: Secondary | ICD-10-CM

## 2024-10-15 DIAGNOSIS — E108 Type 1 diabetes mellitus with unspecified complications: Secondary | ICD-10-CM

## 2024-10-15 DIAGNOSIS — Z720 Tobacco use: Secondary | ICD-10-CM

## 2024-10-15 DIAGNOSIS — Z79899 Other long term (current) drug therapy: Secondary | ICD-10-CM

## 2024-10-15 DIAGNOSIS — I714 Abdominal aortic aneurysm, without rupture, unspecified: Secondary | ICD-10-CM

## 2024-10-15 MED ORDER — LOSARTAN POTASSIUM 50 MG PO TABS
50.0000 mg | ORAL_TABLET | Freq: Every day | ORAL | 3 refills | Status: AC
  Filled 2024-10-15: qty 90, 90d supply, fill #0

## 2024-10-15 MED ORDER — APIXABAN 5 MG PO TABS
5.0000 mg | ORAL_TABLET | Freq: Two times a day (BID) | ORAL | 1 refills | Status: AC
  Filled 2024-10-15: qty 180, 90d supply, fill #0
  Filled 2024-11-23: qty 60, 30d supply, fill #0

## 2024-10-15 NOTE — Patient Instructions (Addendum)
 Look for senna colace  Medication Instructions:   Please start losartan  50 mg daily  If you need a refill on your cardiac medications before your next appointment, please call your pharmacy.   Lab work: CBC today  Testing/Procedures: No Russell testing needed  Follow-Up: At [facility], you and your health needs are our priority.  As part of our continuing mission to provide you with exceptional heart care, we have created designated Provider Care Teams.  These Care Teams include your primary Cardiologist (physician) and Advanced Practice Providers (APPs -  Physician Assistants and Nurse Practitioners) who all work together to provide you with the care you need, when you need it.  You will need a follow up appointment in 12 months  Providers on your designated Care Team:   Russell Meager, NP Bernardino Bring, PA-C Russell Gomez, Russell Gomez  COVID-19 Vaccine Information can be found at: [email or web address] For questions related to vaccine distribution or appointments, please email [email or web address] or call [phone].
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
## 2024-10-16 LAB — CBC
Hematocrit: 44.3 % (ref 37.5–51.0)
Hemoglobin: 14.1 g/dL (ref 13.0–17.7)
MCH: 30 pg (ref 26.6–33.0)
MCHC: 31.8 g/dL (ref 31.5–35.7)
MCV: 94 fL (ref 79–97)
Platelets: 360 x10E3/uL (ref 150–450)
RBC: 4.7 x10E6/uL (ref 4.14–5.80)
RDW: 12.9 % (ref 11.6–15.4)
WBC: 7.3 x10E3/uL (ref 3.4–10.8)

## 2024-10-17 ENCOUNTER — Ambulatory Visit: Payer: Self-pay | Admitting: Cardiovascular Disease

## 2024-10-18 ENCOUNTER — Other Ambulatory Visit: Payer: Self-pay

## 2024-10-23 ENCOUNTER — Telehealth: Payer: Self-pay | Admitting: Cardiovascular Disease

## 2024-10-23 NOTE — Telephone Encounter (Signed)
 Called and spoke with the patient.  Informed the patient that the blood pressure numbers he provided will be forwarded to Dr. Gollan to review and provide recommendations.

## 2024-10-23 NOTE — Telephone Encounter (Signed)
 Called and spoke with the patient.  Patient states he was feeling funny all day yesterday.  He has checked his blood sugar and they seem to be in range.  He was not able to give any more specific symptoms other than to say it felt similar to when his blood sugar is going low.  Patient checked his blood pressure via app on his phone that measures it by placing his finger on the camera lens of the phone.  The two numbers he reported are 91/61 this morning and 151/112 right before the call from this RN.  Patient states that he is not sure how accurate those numbers are, but he does have a blood pressure machine coming today.  Advised the patient that his message will be forwarded to Dr. Gollan and his nurse for input and we will call him back with any recommendations.  Patient verbalized understanding with all questions and concerns addressed at this time. Patient expressed gratitude for the call.

## 2024-10-23 NOTE — Telephone Encounter (Signed)
 During the second call to the patient, this RN did advise the patient to go to the emergency room or call 911 for a thorough work up if his symptoms are persistent, unresolved and concerning.

## 2024-10-23 NOTE — Telephone Encounter (Signed)
 Pt c/o medication issue:  1. Name of Medication:   losartan  (COZAAR ) 50 MG tablet    2. How are you currently taking this medication (dosage and times per day)? As written   3. Are you having a reaction (difficulty breathing--STAT)? No   4. What is your medication issue? Pt stated he started medication on Saturday and by Monday after taking medication he became sleepy and dizziness. BP today was 91/61  Please advise

## 2024-10-23 NOTE — Telephone Encounter (Signed)
 Patient calling with BP log. He found a wrist meter and has been using that. Pt complains he is still very sleepy and tired, even though he slept well and ate today. Please advise.   141/52 This morning  131/65 an hour later  130/66 currently

## 2024-10-25 ENCOUNTER — Other Ambulatory Visit: Payer: Self-pay | Admitting: "Endocrinology

## 2024-10-25 ENCOUNTER — Encounter: Payer: Self-pay | Admitting: Cardiovascular Disease

## 2024-10-25 ENCOUNTER — Telehealth: Payer: Self-pay | Admitting: Cardiovascular Disease

## 2024-10-25 DIAGNOSIS — E10649 Type 1 diabetes mellitus with hypoglycemia without coma: Secondary | ICD-10-CM

## 2024-10-25 NOTE — Telephone Encounter (Signed)
 RX addended to reflect most recent notes

## 2024-10-25 NOTE — Telephone Encounter (Signed)
 Pt c/o medication issue:  1. Name of Medication:   losartan  (COZAAR ) 50 MG tablet   2. How are you currently taking this medication (dosage and times per day)?   As prescribed  3. Are you having a reaction (difficulty breathing--STAT)?   4. What is your medication issue?   Patient stated this medication has been causing his blood pressure to go too low and has stopped taking this medication.   Patient is concerned he did not want to take this medication when he is going to sleep.  Patient noted his BP was 161/89.  Patient noted he is at work and will not be able to take calls but can leave him a voice mail.  Note - Call disconnected abruptly.

## 2024-10-25 NOTE — Telephone Encounter (Signed)
 Returned patients call. Per triage message, patient was at work and unable to answer the phone; requested a detailed voicemail.  Patient reported he stopped taking Losartan  due to low blood pressure readings. Review of BP readings reported on 10/23/24 showed values within normal limits: 141/52, 131/65, 130/66. These readings were forwarded to Dr. Gollan on 10/23/24.  Patient called again today (10/25/24) stating he discontinued Losartan  and reports feeling better. He reported a current BP of 161/89. A voicemail was left advising that while it is the patients choice to stop a medication if it makes him feel unwell, he should continue to keep a BP log. Education was provided that recommended BP goals are systolic <140 and diastolic <90.  Patient later returned the call and spoke directly with this nurse. He expressed frustration regarding prior advice to seek emergency care when he reported diastolic readings in the 40s, as well as with being advised to monitor BP and follow up if elevated after stopping medication. Explanation provided that Dr. Gollan has been out of the office and has not yet responded to the message regarding medication discontinuation.  Patient stated he was hoping the medication dose could be reduced. Explained that medication adjustments are determined by the provider. Patient was informed that once Dr. Gollan reviews and responds, he will be contacted with any new recommendations or medication orders.

## 2024-10-25 NOTE — Telephone Encounter (Signed)
 error

## 2024-10-26 ENCOUNTER — Other Ambulatory Visit: Payer: Self-pay

## 2024-10-26 DIAGNOSIS — E10649 Type 1 diabetes mellitus with hypoglycemia without coma: Secondary | ICD-10-CM

## 2024-10-26 MED ORDER — INSULIN LISPRO (1 UNIT DIAL) 100 UNIT/ML (KWIKPEN)
PEN_INJECTOR | SUBCUTANEOUS | 3 refills | Status: DC
  Filled 2024-10-26: qty 9, 30d supply, fill #0
  Filled 2024-11-22: qty 9, 30d supply, fill #1

## 2024-10-26 NOTE — Telephone Encounter (Signed)
 Called patient and left detailed message per DPR. Left message with the following from Dr. Gollan.  Blood pressure looks better on metoprolol  with losartan  Would continue current regimen Cause of fatigue/sleepiness unclear, less likely a cardiac etiology   Thx TGollan  Asked patient to call back with any questions.

## 2024-10-26 NOTE — Telephone Encounter (Signed)
 Called patient and left detailed message per DPR. Informed patient of the following from DR. Gollan.  Blood pressure measurements he detailed were not low He is welcome to try half a pill Losartan  25 Thx TGollan  Asked patient to call back with any questions.

## 2024-10-26 NOTE — Telephone Encounter (Signed)
 See other telephone encounter.

## 2024-11-06 ENCOUNTER — Other Ambulatory Visit: Payer: Self-pay

## 2024-11-13 ENCOUNTER — Ambulatory Visit: Admitting: "Endocrinology

## 2024-11-19 ENCOUNTER — Other Ambulatory Visit: Payer: Self-pay

## 2024-11-20 ENCOUNTER — Other Ambulatory Visit: Payer: Self-pay

## 2024-11-22 ENCOUNTER — Other Ambulatory Visit: Payer: Self-pay

## 2024-11-22 MED ORDER — FIASP FLEXTOUCH 100 UNIT/ML ~~LOC~~ SOPN
PEN_INJECTOR | SUBCUTANEOUS | 3 refills | Status: AC
  Filled 2024-11-22: qty 15, 30d supply, fill #0

## 2024-11-23 ENCOUNTER — Other Ambulatory Visit: Payer: Self-pay

## 2024-12-12 ENCOUNTER — Other Ambulatory Visit: Payer: Self-pay

## 2024-12-13 ENCOUNTER — Other Ambulatory Visit (HOSPITAL_COMMUNITY): Payer: Self-pay

## 2024-12-13 ENCOUNTER — Other Ambulatory Visit: Payer: Self-pay
